# Patient Record
Sex: Male | Born: 1950 | Race: White | Hispanic: No | Marital: Married | Smoking: Never smoker
Health system: Southern US, Community
[De-identification: ages and names within clinical notes are randomized; demographics above are authoritative.]

## PROBLEM LIST (undated history)

## (undated) DIAGNOSIS — C9000 Multiple myeloma not having achieved remission: Secondary | ICD-10-CM

## (undated) DIAGNOSIS — G629 Polyneuropathy, unspecified: Secondary | ICD-10-CM

## (undated) HISTORY — PX: NO PAST SURGERIES: SHX2092

## (undated) HISTORY — DX: Polyneuropathy, unspecified: G62.9

---

## 2014-02-25 ENCOUNTER — Ambulatory Visit (INDEPENDENT_AMBULATORY_CARE_PROVIDER_SITE_OTHER): Payer: BC Managed Care – PPO

## 2014-02-25 ENCOUNTER — Ambulatory Visit (INDEPENDENT_AMBULATORY_CARE_PROVIDER_SITE_OTHER): Payer: BC Managed Care – PPO | Admitting: Internal Medicine

## 2014-02-25 ENCOUNTER — Telehealth: Payer: Self-pay

## 2014-02-25 VITALS — BP 132/82 | HR 92 | Temp 98.6°F | Resp 16 | Ht 75.5 in | Wt 260.0 lb

## 2014-02-25 DIAGNOSIS — F329 Major depressive disorder, single episode, unspecified: Secondary | ICD-10-CM | POA: Insufficient documentation

## 2014-02-25 DIAGNOSIS — M79642 Pain in left hand: Secondary | ICD-10-CM

## 2014-02-25 DIAGNOSIS — F32A Depression, unspecified: Secondary | ICD-10-CM | POA: Insufficient documentation

## 2014-02-25 DIAGNOSIS — S63502A Unspecified sprain of left wrist, initial encounter: Secondary | ICD-10-CM

## 2014-02-25 DIAGNOSIS — F324 Major depressive disorder, single episode, in partial remission: Secondary | ICD-10-CM

## 2014-02-25 DIAGNOSIS — M25532 Pain in left wrist: Secondary | ICD-10-CM

## 2014-02-25 HISTORY — DX: Depression, unspecified: F32.A

## 2014-02-25 NOTE — Progress Notes (Signed)
IDENTIFYING INFORMATION  David Mcgrath / DOB: 1950-04-24 / MRN: [id number]  The patient has Depression on his problem list.  SUBJECTIVE  Chief Complaint: Wrist Injury   History of present illness: [name] is a 63 y.o. year old male who presents with 5 days of left wrist pain.  He injured the wrist while participating in weight training. The pain is roughly 4/10 He reports a slight decrease in ROM, strength, and swelling. He reports having bony tenderness over his carpal bones.  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  He does not feel that this new problem is related to the wrist injury.    He reports a three year history of depression that began after some financial stressors in 2013.  At that time he had difficulty with dysthymic mood, anhedonia, poor appetite, psychomotor slowing, and poor concentration.  He did not, nor has he ever, felt suicidal. He did not pursue talk therapy for this problem.  He started taking 20 mg of fluoxetine and reports that this has remised his symptoms. He has an excellent support network which includes his wife, his church, and friends.   He continues fluoxetine today and denies dysthymia and and anhedonia at this time.    He  has a past medical history of Neuropathy.    He has a current medication list which includes the following prescription(s): fluoxetine.  [name] has No Known Allergies. He  reports that he has never smoked. He does not have any smokeless tobacco history on file. He reports that he drinks alcohol. He reports that he does not use illicit drugs. He  has no sexual activity history on file.  The patient  has no past surgical history on file.  His family history includes Cancer in his father and mother; Diabetes in his mother; Heart disease in his mother; Hyperlipidemia in his father.  Review of Systems  Constitutional: Negative.   Respiratory: Negative for cough and shortness of breath.     Cardiovascular: Negative for chest pain and palpitations.  Gastrointestinal: Negative for heartburn and nausea.  Musculoskeletal: Positive for myalgias and joint pain. Negative for falls.  Skin: Negative.   Neurological: Negative for dizziness, speech change, focal weakness and headaches.  Psychiatric/Behavioral: Negative for depression and suicidal ideas. The patient is not nervous/anxious and does not have insomnia.     OBJECTIVE  Blood pressure 132/82, pulse 92, temperature 98.6 F (37 C), temperature source Oral, resp. rate 16, height 6' 3.5" (1.918 m), weight 260 lb (117.935 kg), SpO2 99 %. The patient's body mass index is 32.06 kg/(m^2).  Physical Exam  Musculoskeletal:       Left elbow: He exhibits normal range of motion, no swelling, no effusion, no deformity and no laceration. No tenderness found.       Left wrist: He exhibits decreased range of motion and swelling. He exhibits no crepitus and no deformity.       Arms:   No results found for this or any previous visit (from the past 24 hour(s)).  [facility] reading (PRIMARY) by  Dr. Perrin MalteseGuest: No acute fracture.  Some arthritic changes noted.     Mental Status Exam:  Appearance: Neat and Well Groomed Eye Contact::  Good Psychomotor:  Normal Speech:  Clear and Coherent and Normal Rate Volume:  Normal Mood:  Euthymic Affect:  Non-Congruent Thought Process:  Linear and Logical Orientation:  Full (Time, Place, and Person) Thought Content:  Negative for grandiosity, hallucinations, and  dellusions. Suicidal Thoughts:  No Homicidal Thoughts:  No Judgement:  Good Insight:  Good  ASSESSMENT & PLAN  David Mcgrath was seen today for wrist injury.  Diagnoses and associated orders for this visit:  Hand pain, left - DG Hand Complete Left; Future  Wrist pain, left - DG Wrist Complete Left; Future  Major depressive disorder, single episode, in partial remission: -     Maintained on fluoxetine 20 mg daily.  No refills needed at this  time.   Wrist sprain, left, initial encounter -     Xray negative for fracture per Dr. Ernestene MentionGuest's read.  Will await radiologist report to confirm.  Left  thumb spica splint applied.  [facility]   -     Injury care provided in AVS.  -     Patient does not endorse the need for pain medication at this time.  -     Advised f/u in one week.   Assessment and plan discussed with Dr. Perrin MalteseGuest and he is in agreement.    The patient was instructed to to call or comeback to clinic as needed, or should symptoms warrant.  David Mcgrath, MHS, PA-C [facility] 02/25/2014 10:42 AM

## 2014-02-25 NOTE — Telephone Encounter (Signed)
Pt saw P.A. Clark 11/24 and requested him to call, asked pt if he could provide more information on what this is regarding, pt states he would just like P.A. Clark to call him.

## 2014-02-25 NOTE — Patient Instructions (Signed)
Ice the hand and wrist at least 3 times daily for 20 minutes.  When possible, keep the wrist and hand above the level of the heart.  Please have your wedding ring removed today, as an increase in swelling could cause neurovascular damage to your finger. Deliah BostonMichael Shawntee Mainwaring, MS, PA-C   10:43 AM, 02/25/2014

## 2016-11-09 ENCOUNTER — Other Ambulatory Visit (INDEPENDENT_AMBULATORY_CARE_PROVIDER_SITE_OTHER): Payer: Self-pay | Admitting: Otolaryngology

## 2016-11-09 ENCOUNTER — Other Ambulatory Visit: Payer: Self-pay

## 2016-11-09 DIAGNOSIS — J329 Chronic sinusitis, unspecified: Secondary | ICD-10-CM

## 2016-11-14 ENCOUNTER — Ambulatory Visit
Admission: RE | Admit: 2016-11-14 | Discharge: 2016-11-14 | Disposition: A | Discharge status: Home / Self Care | Payer: BLUE CROSS/BLUE SHIELD | Source: Ambulatory Visit | Attending: Otolaryngology | Admitting: Otolaryngology

## 2016-11-14 DIAGNOSIS — J329 Chronic sinusitis, unspecified: Secondary | ICD-10-CM

## 2018-02-08 DIAGNOSIS — Z01818 Encounter for other preprocedural examination: Secondary | ICD-10-CM | POA: Diagnosis not present

## 2018-02-22 DIAGNOSIS — K640 First degree hemorrhoids: Secondary | ICD-10-CM | POA: Diagnosis not present

## 2018-02-22 DIAGNOSIS — Z800 Family history of malignant neoplasm of digestive organs: Secondary | ICD-10-CM | POA: Diagnosis not present

## 2018-02-22 DIAGNOSIS — K5730 Diverticulosis of large intestine without perforation or abscess without bleeding: Secondary | ICD-10-CM | POA: Diagnosis not present

## 2018-04-24 DIAGNOSIS — G5603 Carpal tunnel syndrome, bilateral upper limbs: Secondary | ICD-10-CM | POA: Diagnosis not present

## 2018-04-28 DIAGNOSIS — G5622 Lesion of ulnar nerve, left upper limb: Secondary | ICD-10-CM | POA: Diagnosis not present

## 2018-04-28 DIAGNOSIS — G5603 Carpal tunnel syndrome, bilateral upper limbs: Secondary | ICD-10-CM | POA: Diagnosis not present

## 2018-05-03 DIAGNOSIS — G5603 Carpal tunnel syndrome, bilateral upper limbs: Secondary | ICD-10-CM | POA: Diagnosis not present

## 2018-06-04 DIAGNOSIS — G5603 Carpal tunnel syndrome, bilateral upper limbs: Secondary | ICD-10-CM | POA: Diagnosis not present

## 2018-06-04 DIAGNOSIS — G5601 Carpal tunnel syndrome, right upper limb: Secondary | ICD-10-CM | POA: Diagnosis not present

## 2018-06-04 DIAGNOSIS — G5602 Carpal tunnel syndrome, left upper limb: Secondary | ICD-10-CM | POA: Diagnosis not present

## 2018-06-04 HISTORY — DX: Carpal tunnel syndrome, right upper limb: G56.01

## 2018-06-19 DIAGNOSIS — G5601 Carpal tunnel syndrome, right upper limb: Secondary | ICD-10-CM | POA: Diagnosis not present

## 2018-06-19 DIAGNOSIS — G5602 Carpal tunnel syndrome, left upper limb: Secondary | ICD-10-CM | POA: Diagnosis not present

## 2018-11-15 DIAGNOSIS — H2513 Age-related nuclear cataract, bilateral: Secondary | ICD-10-CM | POA: Diagnosis not present

## 2018-11-15 DIAGNOSIS — H524 Presbyopia: Secondary | ICD-10-CM | POA: Diagnosis not present

## 2018-11-20 DIAGNOSIS — I10 Essential (primary) hypertension: Secondary | ICD-10-CM | POA: Diagnosis not present

## 2018-11-20 DIAGNOSIS — Z6831 Body mass index (BMI) 31.0-31.9, adult: Secondary | ICD-10-CM | POA: Diagnosis not present

## 2018-11-20 DIAGNOSIS — Z0000 Encounter for general adult medical examination without abnormal findings: Secondary | ICD-10-CM | POA: Diagnosis not present

## 2018-11-20 DIAGNOSIS — Z1211 Encounter for screening for malignant neoplasm of colon: Secondary | ICD-10-CM | POA: Diagnosis not present

## 2018-11-22 DIAGNOSIS — Z1211 Encounter for screening for malignant neoplasm of colon: Secondary | ICD-10-CM | POA: Diagnosis not present

## 2018-11-22 DIAGNOSIS — Z1389 Encounter for screening for other disorder: Secondary | ICD-10-CM | POA: Diagnosis not present

## 2018-11-22 DIAGNOSIS — Z6832 Body mass index (BMI) 32.0-32.9, adult: Secondary | ICD-10-CM | POA: Diagnosis not present

## 2018-11-22 DIAGNOSIS — Z0000 Encounter for general adult medical examination without abnormal findings: Secondary | ICD-10-CM | POA: Diagnosis not present

## 2018-11-23 DIAGNOSIS — J323 Chronic sphenoidal sinusitis: Secondary | ICD-10-CM | POA: Diagnosis not present

## 2018-11-23 DIAGNOSIS — R42 Dizziness and giddiness: Secondary | ICD-10-CM | POA: Diagnosis not present

## 2018-11-23 DIAGNOSIS — J310 Chronic rhinitis: Secondary | ICD-10-CM | POA: Diagnosis not present

## 2018-11-23 DIAGNOSIS — H608X3 Other otitis externa, bilateral: Secondary | ICD-10-CM | POA: Diagnosis not present

## 2019-04-28 ENCOUNTER — Ambulatory Visit: Payer: BLUE CROSS/BLUE SHIELD

## 2019-05-18 ENCOUNTER — Ambulatory Visit: Payer: BC Managed Care – PPO | Attending: Internal Medicine

## 2019-05-18 DIAGNOSIS — Z23 Encounter for immunization: Secondary | ICD-10-CM | POA: Insufficient documentation

## 2019-05-18 NOTE — Progress Notes (Signed)
   Covid-19 Vaccination Clinic  Name:  David Mcgrath    MRN: [id number] DOB: 08/23/1950  05/18/2019  [name] was observed post Covid-19 immunization for 15 minutes without incidence. He was provided with Vaccine Information Sheet and instruction to access the V-Safe system.   [name] was instructed to call 911 with any severe reactions post vaccine: Marland Kitchen Difficulty breathing  . Swelling of your face and throat  . A fast heartbeat  . A bad rash all over your body  . Dizziness and weakness    Immunizations Administered    Name Date Dose VIS Date Route   Pfizer COVID-19 Vaccine 05/18/2019  8:30 AM 0.3 mL 03/15/2019 Intramuscular   Manufacturer: [employer], [employer]   Lot: [id number]   NDC: [address]-0

## 2019-06-09 ENCOUNTER — Ambulatory Visit: Payer: BC Managed Care – PPO | Attending: Internal Medicine

## 2019-06-09 DIAGNOSIS — Z23 Encounter for immunization: Secondary | ICD-10-CM | POA: Insufficient documentation

## 2019-06-09 NOTE — Progress Notes (Signed)
   Covid-19 Vaccination Clinic  Name:  Reg Bircher    MRN: [id number] DOB: [birth year]  06/09/2019  Mr. Poffenberger was observed post Covid-19 immunization for 30 minutes based on pre-vaccination screening without incident. He was provided with Vaccine Information Sheet and instruction to access the V-Safe system.   Mr. Candelas was instructed to call 911 with any severe reactions post vaccine: Marland Kitchen Difficulty breathing  . Swelling of face and throat  . A fast heartbeat  . A bad rash all over body  . Dizziness and weakness   Immunizations Administered    Name Date Dose VIS Date Route   Pfizer COVID-19 Vaccine 06/09/2019 10:11 AM 0.3 mL 03/15/2019 Intramuscular   Manufacturer: [employer], [employer]   Lot: [id number]   NDC: [address]-0

## 2019-08-06 DIAGNOSIS — K625 Hemorrhage of anus and rectum: Secondary | ICD-10-CM | POA: Diagnosis not present

## 2019-08-06 DIAGNOSIS — K648 Other hemorrhoids: Secondary | ICD-10-CM | POA: Diagnosis not present

## 2019-08-12 IMAGING — CT CT MAXILLOFACIAL W/O CM
1 series · 15 of 30 positions shown, 19 images · non-contrast
Comparison: None.

CLINICAL DATA: Chronic sinusitis and infections. No relief with
antibiotics.

EXAM:
CT MAXILLOFACIAL WITHOUT CONTRAST
TECHNIQUE: Multidetector CT images of the paranasal sinuses were obtained using
the standard protocol without intravenous contrast.

[Series 4: soft tissue · axial · 0.52mm/px · z∈[-137,+21]mm · 15 of 170 slices shown, 19 images]
[im 6/170  brain]
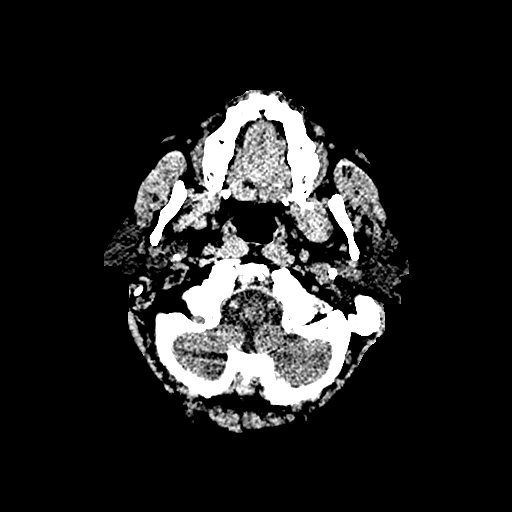
[im 6/170  bone]
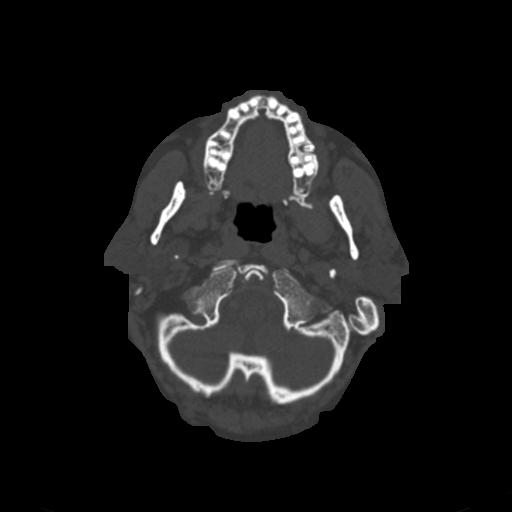
[im 18/170  bone]
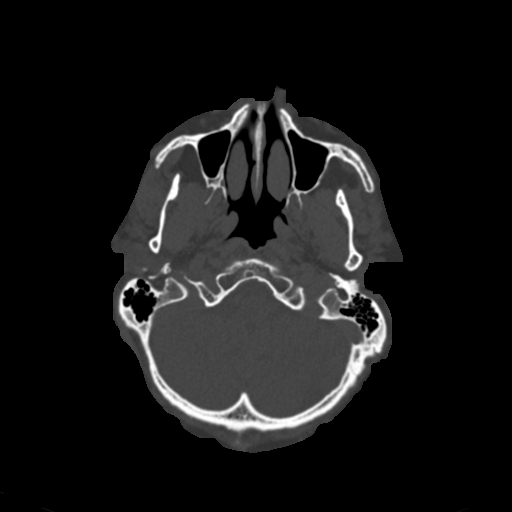
[im 30/170  bone]
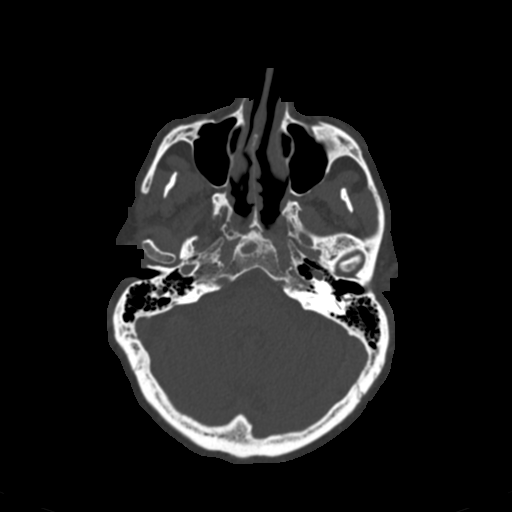
[im 41/170  bone]
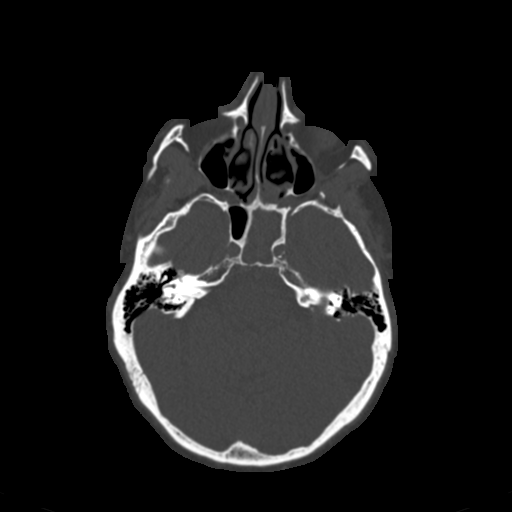
[im 53/170  brain]
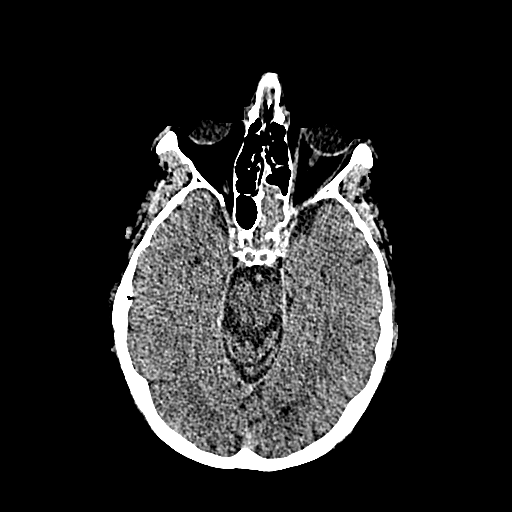
[im 53/170  bone]
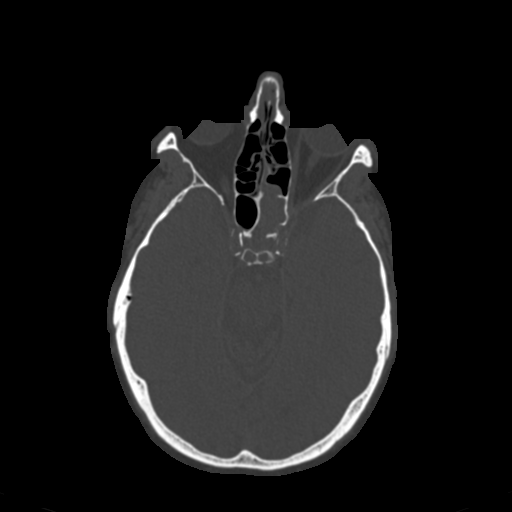
[im 65/170  bone]
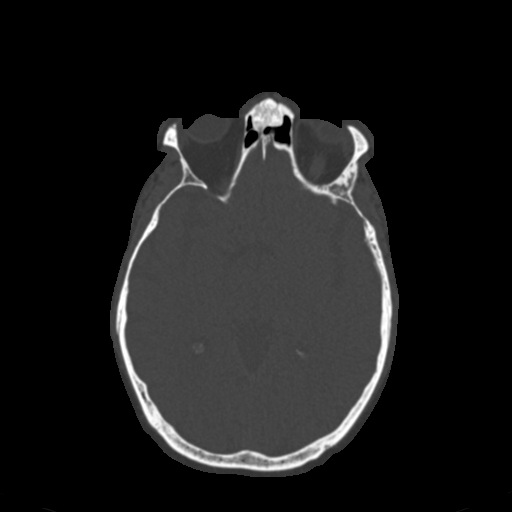
[im 76/170  bone]
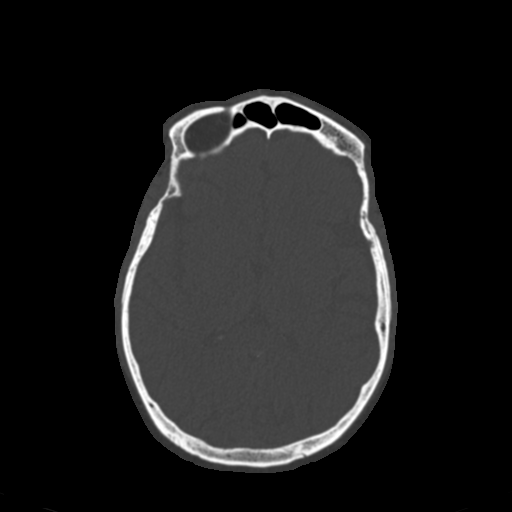
[im 88/170  bone]
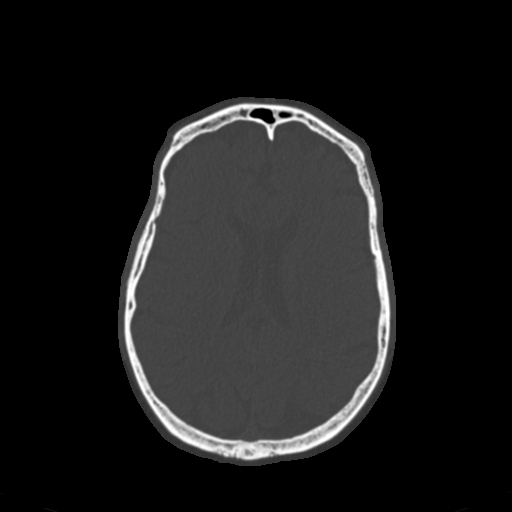
[im 94/170  brain]
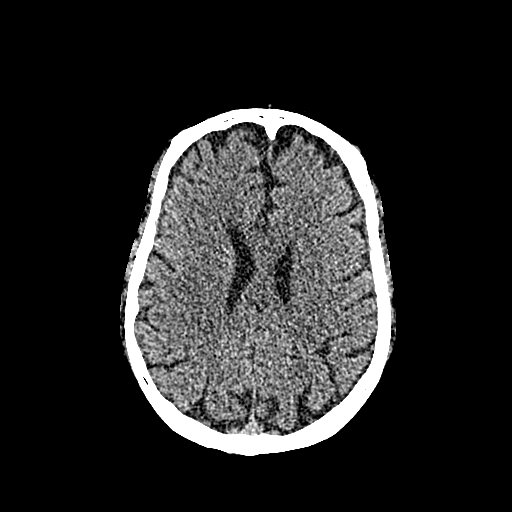
[im 94/170  bone]
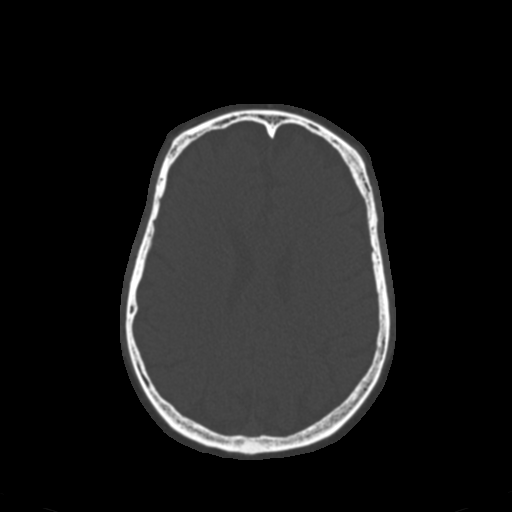
[im 105/170  bone]
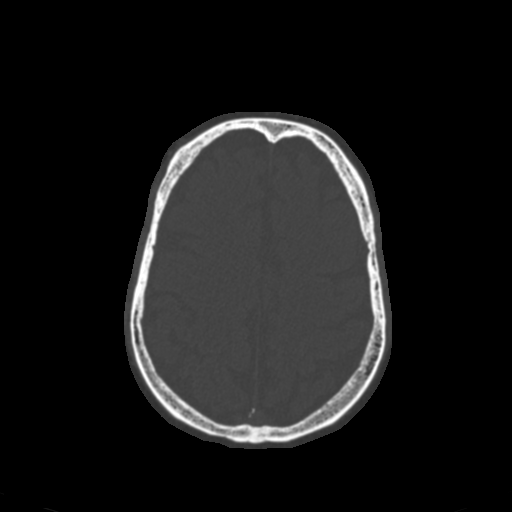
[im 117/170  bone]
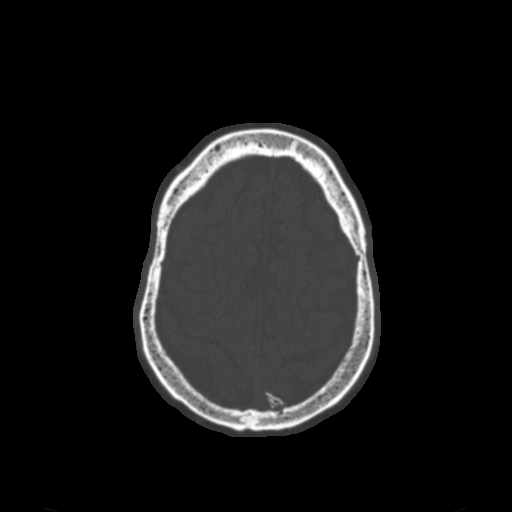
[im 129/170  bone]
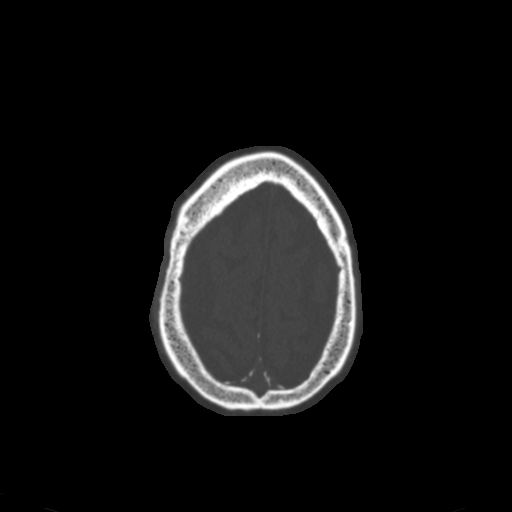
[im 140/170  brain]
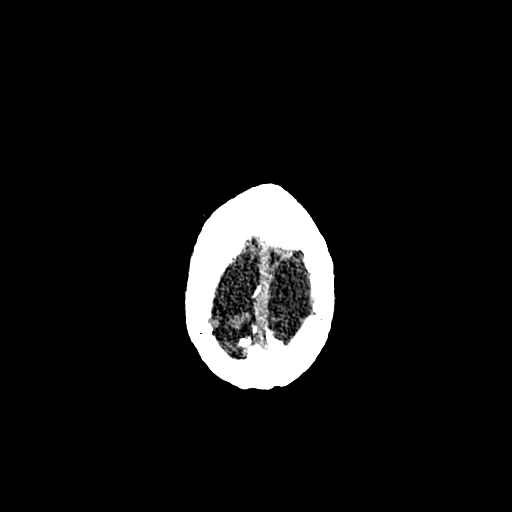
[im 140/170  bone]
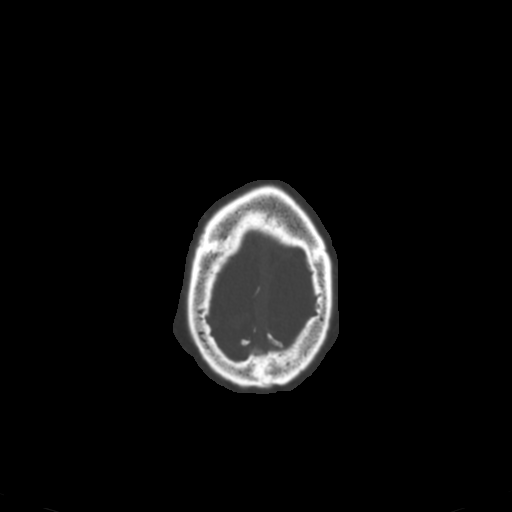
[im 152/170  bone]
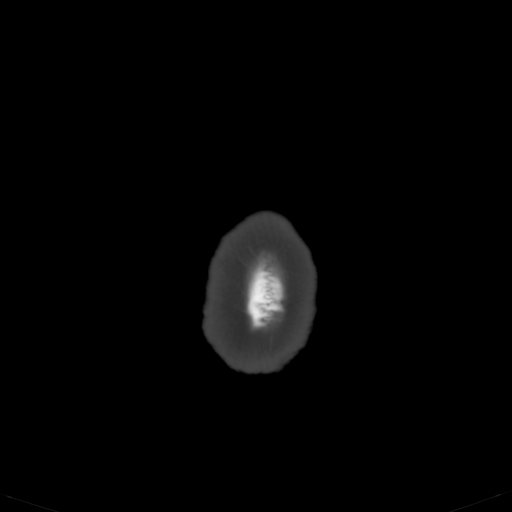
[im 164/170  bone]
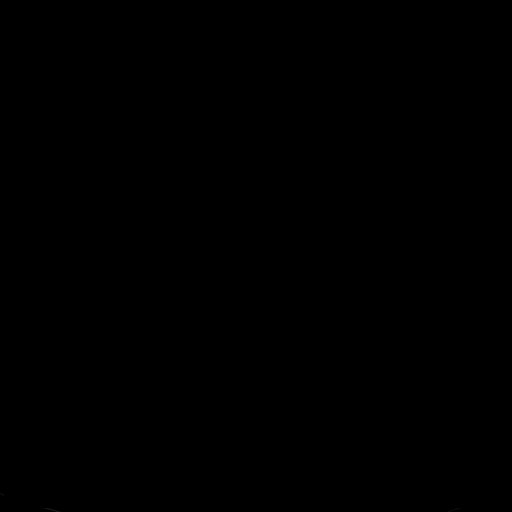

[15 of 30 positions shown; findings below may reference images not displayed]

FINDINGS: Paranasal sinuses:

Frontal: Normally aerated. Patent frontal sinus drainage pathways.

Ethmoid: Normally aerated. A prominent ethmoid bulla is present
bilaterally without obstruction.

Maxillary: Normally aerated.

Sphenoid: The left sphenoid sinus is chronically opacified. Wall
thickening is noted. There is soft tissue with calcification
extending into the posterior ethmoid with widening of the sphenoid
ethmoidal recess. There is marked thinning or dehiscence of the roof
of the left sphenoid sinus. This may communicate with the sella
turcica. The left sphenoid sinus is expanded. Mucocele is suspected.

Right ostiomeatal unit: Patent.

Left ostiomeatal unit: Patent.

Nasal passages: The nasal cavity is clear. Rightward nasal septal
spurring extends 11 mm to the right, displacing the right middle
turbinate without obstruction.

Anatomy: No pneumatization superior to anterior ethmoid notches.
Symmetric and intact olfactory grooves and fovea ethmoidalis, Keros
I (1-3mm). Sellar sphenoid pneumatization pattern. No dehiscence of
carotid or optic canals. No onodi cell.

Other: Orbits and intracranial compartment are unremarkable. Visible
mastoid air cells are normally aerated.
IMPRESSION: 1. Chronic obstruction of the left sphenoid sinus with what appears
to be mucocele.
2. Marked thinning or dehiscence of the roof of the left sphenoid
sinus with possible communication into the sella turcica.
3. The paranasal sinuses and mastoid air cells are otherwise clear.
4. Rightward nasal septal spurring as described.
5. Prominent ethmoid bulla bilaterally without obstruction.

## 2019-11-18 ENCOUNTER — Encounter: Payer: Self-pay | Admitting: Internal Medicine

## 2019-11-18 ENCOUNTER — Other Ambulatory Visit: Payer: Self-pay | Admitting: Internal Medicine

## 2019-11-18 DIAGNOSIS — G629 Polyneuropathy, unspecified: Secondary | ICD-10-CM | POA: Diagnosis not present

## 2019-11-18 DIAGNOSIS — E785 Hyperlipidemia, unspecified: Secondary | ICD-10-CM | POA: Diagnosis not present

## 2019-11-18 DIAGNOSIS — R2681 Unsteadiness on feet: Secondary | ICD-10-CM | POA: Diagnosis not present

## 2019-11-18 DIAGNOSIS — F321 Major depressive disorder, single episode, moderate: Secondary | ICD-10-CM | POA: Diagnosis not present

## 2019-12-31 DIAGNOSIS — Z23 Encounter for immunization: Secondary | ICD-10-CM | POA: Diagnosis not present

## 2019-12-31 DIAGNOSIS — R2681 Unsteadiness on feet: Secondary | ICD-10-CM | POA: Diagnosis not present

## 2019-12-31 DIAGNOSIS — G629 Polyneuropathy, unspecified: Secondary | ICD-10-CM | POA: Diagnosis not present

## 2020-01-09 ENCOUNTER — Encounter: Payer: Self-pay | Admitting: Neurology

## 2020-01-09 ENCOUNTER — Ambulatory Visit: Payer: BC Managed Care – PPO | Admitting: Neurology

## 2020-01-09 VITALS — BP 129/60 | HR 69 | Ht 76.0 in | Wt 260.0 lb

## 2020-01-09 DIAGNOSIS — R260 Ataxic gait: Secondary | ICD-10-CM

## 2020-01-09 DIAGNOSIS — G629 Polyneuropathy, unspecified: Secondary | ICD-10-CM

## 2020-01-09 DIAGNOSIS — R202 Paresthesia of skin: Secondary | ICD-10-CM | POA: Diagnosis not present

## 2020-01-09 MED ORDER — TOPIRAMATE 25 MG PO TABS
25.0000 mg | ORAL_TABLET | Freq: Two times a day (BID) | ORAL | 2 refills | Status: DC
Start: 2020-01-09 — End: 2020-04-06

## 2020-01-09 NOTE — Progress Notes (Signed)
[facility] [address] [place]. [place] [address] [phone]       OFFICE CONSULT NOTE  David Mcgrath Date of Birth:  1951-01-01 Medical Record Number:  [id number]   Referring MD: David Mcgrath Reason for Referral: Neuropathy HPI: David Mcgrath is a 69 year old pleasant Caucasian male seen today for initial office consultation visit for neuropathy.  He states he has longstanding peripheral neuropathy for at least 30+ years.  He has had chronic paresthesias and tingling but feels for the last 5 to 6 years his balance is getting worse as well as the tingling and numbness at times her course quite severe and extend up from his toes where he has constant numbness to sometimes up his legs and medial thighs as well.  He also has some occasional cramps.  Eating at times has a difficult time sleeping because of his paresthesias at the bottom of the feet which is constantly numb and tingling.  He states he has been seen by neurologist in the past and remembers having EMG nerve conduction study done in 2011 when he was seen at [facility] neurology.  He is not sure whether he has tried medications like gabapentin, Topamax, Lyrica for his paresthesias.  He also had paresthesias in the hand and underwent bilateral carpal tunnel surgery about a year ago by Dr. Orlan Mcgrath.  He is denies significant pain in his hands but has persistent paresthesias and slight weakness with diminished fine motor skills in his hand.From.  He is brought in some referral notes which includes recent labs from earlier this month which show serum protein electrophoresis and TSH.  He also recently underwent a sleep study and was diagnosed with sleep apnea and started using CPAP.  He recently underwent lab work which she has brought which showed normal serum protein electrophoresis TSH.  He states is careful and gets up slowly has had occasional falls but no major injuries.  He does not use a cane or walker but admits he has  more trouble walking on uneven surfaces.  ROS:   14 system review of systems is positive for numbness, tingling, cramps, gait imbalance, falls and all other systems negative PMH:  Past Medical History:  Diagnosis Date  . Neuropathy     Social History:  Social History   Socioeconomic History  . Marital status: Married    Spouse name: David Mcgrath  . Number of children: 0  . Years of education: Not on file  . Highest education level: Not on file  Occupational History  . Occupation: Event organiser  Tobacco Use  . Smoking status: Never Smoker  . Smokeless tobacco: Never Used  Substance and Sexual Activity  . Alcohol use: Yes    Alcohol/week: 0.0 standard drinks  . Drug use: No  . Sexual activity: Not on file  Other Topics Concern  . Not on file  Social History Narrative   Lives with spouse   Right handed   Drinks 4+ cups of caffeine daily   Social Determinants of Health   Financial Resource Strain:   . Difficulty of Paying Living Expenses: Not on file  Food Insecurity:   . Worried About Programme researcher, broadcasting/film/video in the Last Year: Not on file  . Ran Out of Food in the Last Year: Not on file  Transportation Needs:   . Lack of Transportation (Medical): Not on file  . Lack of Transportation (Non-Medical): Not on file  Physical Activity:   . Days of Exercise per Week: Not on file  .  Minutes of Exercise per Session: Not on file  Stress:   . Feeling of Stress : Not on file  Social Connections:   . Frequency of Communication with Friends and Family: Not on file  . Frequency of Social Gatherings with Friends and Family: Not on file  . Attends Religious Services: Not on file  . Active Member of Clubs or Organizations: Not on file  . Attends Banker Meetings: Not on file  . Marital Status: Not on file  Intimate Partner Violence:   . Fear of Current or Ex-Partner: Not on file  . Emotionally Abused: Not on file  . Physically Abused: Not on file  . Sexually Abused: Not on  file    Medications:   Current Outpatient Medications on File Prior to Visit  Medication Sig Dispense Refill  . allopurinol (ZYLOPRIM) 300 MG tablet allopurinol 300 mg tablet    . ezetimibe (ZETIA) 10 MG tablet Take 10 mg by mouth daily.    Marland Kitchen FLUoxetine (PROZAC) 10 MG tablet Take 10 mg by mouth 2 (two) times daily.    . indomethacin (INDOCIN) 50 MG capsule daily as needed.     Marland Kitchen olmesartan (BENICAR) 20 MG tablet Take 20 mg by mouth daily.     No current facility-administered medications on file prior to visit.    Allergies:   Allergies  Allergen Reactions  . Shrimp [Shellfish Allergy]     Physical Exam General: well developed, well nourished middle-aged Caucasian male, seated, in no evident distress Head: head normocephalic and atraumatic.   Neck: supple with no carotid or supraclavicular bruits Cardiovascular: regular rate and rhythm, no murmurs Musculoskeletal: no deformity Skin:  no rash/petichiae 1+ pedal edema bilaterally. Vascular:  Normal pulses all extremities  Neurologic Exam Mental Status: Awake and fully alert. Oriented to place and time. Recent and remote memory intact. Attention span, concentration and fund of knowledge appropriate. Mood and affect appropriate.  Cranial Nerves: Fundoscopic exam reveals sharp disc margins. Pupils equal, briskly reactive to light. Extraocular movements full without nystagmus. Visual fields full to confrontation. Hearing intact. Facial sensation intact. Face, tongue, palate moves normally and symmetrically.  Motor: Normal bulk and tone. Normal strength in all tested extremity muscles except mild wasting of bilateral thenar eminences with slight weakness of intrinsic hand muscles and grip bilaterally.  Mild weakness of ankle plantar flexors bilaterally.. Sensory.: intact to touch , pinprick , diminished position and vibratory sensation over toes bilaterally..  Coordination: Rapid alternating movements normal in all extremities.  Finger-to-nose and heel-to-shin performed accurately bilaterally. Gait and Station: Arises from chair with slight difficulty. Stance is slightly broad-based l. Gait demonstrates mild ataxia and is unsteady on a narrow base and while standing on either foot unsupported.   Reflexes: 1+ and symmetric except both ankle jerks are depressed.. Toes downgoing.      ASSESSMENT: 69 year old Caucasian male with longstanding 30-year history of peripheral neuropathy with worsening paresthesias and gait ataxia recently.     PLAN: I had a long discussion with the patient regarding his longstanding symptoms of lower and upper extremity paresthesias and peripheral neuropathy and gait ataxia and answered questions.  Recommend trial of Topamax 25 mg twice daily for his paresthesias and to increase as tolerated.  Have discussed possible side effects with him and advised him to call me if needed.  Check EMG nerve conduction study as well as neuropathy panel labs.  We also discussed fall and safety precautions.  Greater than 50% time during this 45-minute consultation visit  was spent on counseling and coordination of care about his longstanding peripheral neuropathy, neuropathic pain and gait ataxia and answering questions he will return for follow-up in the future in 2 months or call earlier if necessary. Delia Heady, MD Note: This document was prepared with digital dictation and possible smart phrase technology. Any transcriptional errors that result from this process are unintentional.

## 2020-01-09 NOTE — Patient Instructions (Signed)
I had a long discussion with the patient regarding his longstanding symptoms of lower and upper extremity paresthesias and peripheral neuropathy and gait ataxia and answered questions.  Recommend trial of Topamax 25 mg twice daily for his paresthesias and to increase as tolerated.  Have discussed possible side effects with him and advised him to call me if needed.  Check EMG nerve conduction study as well as neuropathy panel labs.  We also discussed fall and safety precautions.  He will return for follow-up in the future in 2 months or call earlier if necessary.  Peripheral Neuropathy Peripheral neuropathy is a type of nerve damage. It affects nerves that carry signals between the spinal cord and the arms, legs, and the rest of the body (peripheral nerves). It does not affect nerves in the spinal cord or brain. In peripheral neuropathy, one nerve or a group of nerves may be damaged. Peripheral neuropathy is a broad category that includes many specific nerve disorders, like diabetic neuropathy, hereditary neuropathy, and carpal tunnel syndrome. What are the causes? This condition may be caused by:  Diabetes. This is the most common cause of peripheral neuropathy.  Nerve injury.  Pressure or stress on a nerve that lasts a long time.  Lack (deficiency) of B vitamins. This can result from alcoholism, poor diet, or a restricted diet.  Infections.  Autoimmune diseases, such as rheumatoid arthritis and systemic lupus erythematosus.  Nerve diseases that are passed from parent to child (inherited).  Some medicines, such as cancer medicines (chemotherapy).  Poisonous (toxic) substances, such as lead and mercury.  Too little blood flowing to the legs.  Kidney disease.  Thyroid disease. In some cases, the cause of this condition is not known. What are the signs or symptoms? Symptoms of this condition depend on which of your nerves is damaged. Common symptoms include:  Loss of feeling (numbness)  in the feet, hands, or both.  Tingling in the feet, hands, or both.  Burning pain.  Very sensitive skin.  Weakness.  Not being able to move a part of the body (paralysis).  Muscle twitching.  Clumsiness or poor coordination.  Loss of balance.  Not being able to control your bladder.  Feeling dizzy.  Sexual problems. How is this diagnosed? Diagnosing and finding the cause of peripheral neuropathy can be difficult. Your health care provider will take your medical history and do a physical exam. A neurological exam will also be done. This involves checking things that are affected by your brain, spinal cord, and nerves (nervous system). For example, your health care provider will check your reflexes, how you move, and what you can feel. You may have other tests, such as:  Blood tests.  Electromyogram (EMG) and nerve conduction tests. These tests check nerve function and how well the nerves are controlling the muscles.  Imaging tests, such as CT scans or MRI to rule out other causes of your symptoms.  Removing a small piece of nerve to be examined in a lab (nerve biopsy). This is rare.  Removing and examining a small amount of the fluid that surrounds the brain and spinal cord (lumbar puncture). This is rare. How is this treated? Treatment for this condition may involve:  Treating the underlying cause of the neuropathy, such as diabetes, kidney disease, or vitamin deficiencies.  Stopping medicines that can cause neuropathy, such as chemotherapy.  Medicine to relieve pain. Medicines may include: ? Prescription or over-the-counter pain medicine. ? Antiseizure medicine. ? Antidepressants. ? Pain-relieving patches that are  applied to painful areas of skin.  Surgery to relieve pressure on a nerve or to destroy a nerve that is causing pain.  Physical therapy to help improve movement and balance.  Devices to help you move around (assistive devices). Follow these  instructions at home: Medicines  Take over-the-counter and prescription medicines only as told by your health care provider. Do not take any other medicines without first asking your health care provider.  Do not drive or use heavy machinery while taking prescription pain medicine. Lifestyle   Do not use any products that contain nicotine or tobacco, such as cigarettes and e-cigarettes. Smoking keeps blood from reaching damaged nerves. If you need help quitting, ask your health care provider.  Avoid or limit alcohol. Too much alcohol can cause a vitamin B deficiency, and vitamin B is needed for healthy nerves.  Eat a healthy diet. This includes: ? Eating foods that are high in fiber, such as fresh fruits and vegetables, whole grains, and beans. ? Limiting foods that are high in fat and processed sugars, such as fried or sweet foods. General instructions   If you have diabetes, work closely with your health care provider to keep your blood sugar under control.  If you have numbness in your feet: ? Check every day for signs of injury or infection. Watch for redness, warmth, and swelling. ? Wear padded socks and comfortable shoes. These help protect your feet.  Develop a good support system. Living with peripheral neuropathy can be stressful. Consider talking with a mental health specialist or joining a support group.  Use assistive devices and attend physical therapy as told by your health care provider. This may include using a walker or a cane.  Keep all follow-up visits as told by your health care provider. This is important. Contact a health care provider if:  You have new signs or symptoms of peripheral neuropathy.  You are struggling emotionally from dealing with peripheral neuropathy.  Your pain is not well-controlled. Get help right away if:  You have an injury or infection that is not healing normally.  You develop new weakness in an arm or leg.  You fall  frequently. Summary  Peripheral neuropathy is when the nerves in the arms, or legs are damaged, resulting in numbness, weakness, or pain.  There are many causes of peripheral neuropathy, including diabetes, pinched nerves, vitamin deficiencies, autoimmune disease, and hereditary conditions.  Diagnosing and finding the cause of peripheral neuropathy can be difficult. Your health care provider will take your medical history, do a physical exam, and do tests, including blood tests and nerve function tests.  Treatment involves treating the underlying cause of the neuropathy and taking medicines to help control pain. Physical therapy and assistive devices may also help. This information is not intended to replace advice given to you by your health care provider. Make sure you discuss any questions you have with your health care provider. Document Revised: 03/03/2017 Document Reviewed: 05/30/2016 Elsevier Patient Education  2020 [employer].  Fall Prevention in the Home, Adult Falls can cause injuries. They can happen to people of all ages. There are many things you can do to make your home safe and to help prevent falls. Ask for help when making these changes, if needed. What actions can I take to prevent falls? General Instructions  Use good lighting in all rooms. Replace any light bulbs that burn out.  Turn on the lights when you go into a dark area. Use night-lights.  Keep  items that you use often in easy-to-reach places. Lower the shelves around your home if necessary.  Set up your furniture so you have a clear path. Avoid moving your furniture around.  Do not have throw rugs and other things on the floor that can make you trip.  Avoid walking on wet floors.  If any of your floors are uneven, fix them.  Add color or contrast paint or tape to clearly mark and help you see: ? Any grab bars or handrails. ? First and last steps of stairways. ? Where the edge of each step is.  If  you use a stepladder: ? Make sure that it is fully opened. Do not climb a closed stepladder. ? Make sure that both sides of the stepladder are locked into place. ? Ask someone to hold the stepladder for you while you use it.  If there are any pets around you, be aware of where they are. What can I do in the bathroom?      Keep the floor dry. Clean up any water that spills onto the floor as soon as it happens.  Remove soap buildup in the tub or shower regularly.  Use non-skid mats or decals on the floor of the tub or shower.  Attach bath mats securely with double-sided, non-slip rug tape.  If you need to sit down in the shower, use a plastic, non-slip stool.  Install grab bars by the toilet and in the tub and shower. Do not use towel bars as grab bars. What can I do in the bedroom?  Make sure that you have a light by your bed that is easy to reach.  Do not use any sheets or blankets that are too big for your bed. They should not hang down onto the floor.  Have a firm chair that has side arms. You can use this for support while you get dressed. What can I do in the kitchen?  Clean up any spills right away.  If you need to reach something above you, use a strong step stool that has a grab bar.  Keep electrical cords out of the way.  Do not use floor polish or wax that makes floors slippery. If you must use wax, use non-skid floor wax. What can I do with my stairs?  Do not leave any items on the stairs.  Make sure that you have a light switch at the top of the stairs and the bottom of the stairs. If you do not have them, ask someone to add them for you.  Make sure that there are handrails on both sides of the stairs, and use them. Fix handrails that are broken or loose. Make sure that handrails are as long as the stairways.  Install non-slip stair treads on all stairs in your home.  Avoid having throw rugs at the top or bottom of the stairs. If you do have throw rugs,  attach them to the floor with carpet tape.  Choose a carpet that does not hide the edge of the steps on the stairway.  Check any carpeting to make sure that it is firmly attached to the stairs. Fix any carpet that is loose or worn. What can I do on the outside of my home?  Use bright outdoor lighting.  Regularly fix the edges of walkways and driveways and fix any cracks.  Remove anything that might make you trip as you walk through a door, such as a raised step or threshold.  Trim any bushes or trees on the path to your home.  Regularly check to see if handrails are loose or broken. Make sure that both sides of any steps have handrails.  Install guardrails along the edges of any raised decks and porches.  Clear walking paths of anything that might make someone trip, such as tools or rocks.  Have any leaves, snow, or ice cleared regularly.  Use sand or salt on walking paths during winter.  Clean up any spills in your garage right away. This includes grease or oil spills. What other actions can I take?  Wear shoes that: ? Have a low heel. Do not wear high heels. ? Have rubber bottoms. ? Are comfortable and fit you well. ? Are closed at the toe. Do not wear open-toe sandals.  Use tools that help you move around (mobility aids) if they are needed. These include: ? Canes. ? Walkers. ? Scooters. ? Crutches.  Review your medicines with your doctor. Some medicines can make you feel dizzy. This can increase your chance of falling. Ask your doctor what other things you can do to help prevent falls. Where to find more information  Centers for Disease Control and Prevention, STEADI: HealthcareCounselor.com.pthttps://cdc.gov  [employer]: RingConnections.sihttps://go4life.nia.nih.gov Contact a doctor if:  You are afraid of falling at home.  You feel weak, drowsy, or dizzy at home.  You fall at home. Summary  There are many simple things that you can do to make your home safe and to help prevent  falls.  Ways to make your home safe include removing tripping hazards and installing grab bars in the bathroom.  Ask for help when making these changes in your home. This information is not intended to replace advice given to you by your health care provider. Make sure you discuss any questions you have with your health care provider. Document Revised: 07/12/2018 Document Reviewed: 11/03/2016 Elsevier Patient Education  2020 [employer]

## 2020-01-12 NOTE — Progress Notes (Signed)
Kindly  inform patient that lab work shows low vitamin D level and positive ANA and he needs to see his primary MD for treatment for this

## 2020-01-13 ENCOUNTER — Telehealth: Payer: Self-pay | Admitting: Emergency Medicine

## 2020-01-13 DIAGNOSIS — R768 Other specified abnormal immunological findings in serum: Secondary | ICD-10-CM | POA: Diagnosis not present

## 2020-01-13 LAB — NEUROPATHY PANEL
A/G Ratio: 1.3 (ref 0.7–1.7)
Albumin ELP: 3.9 g/dL (ref 2.9–4.4)
Alpha 1: 0.2 g/dL (ref 0.0–0.4)
Alpha 2: 1 g/dL (ref 0.4–1.0)
Angio Convert Enzyme: 68 U/L (ref 14–82)
Anti Nuclear Antibody (ANA): POSITIVE — AB
Beta: 1 g/dL (ref 0.7–1.3)
Gamma Globulin: 0.9 g/dL (ref 0.4–1.8)
Globulin, Total: 3.1 g/dL (ref 2.2–3.9)
Rheumatoid fact SerPl-aCnc: 10 IU/mL (ref 0.0–13.9)
Sed Rate: 17 mm/hr (ref 0–30)
TSH: 2.48 u[IU]/mL (ref 0.450–4.500)
Total Protein: 7 g/dL (ref 6.0–8.5)
Vit D, 25-Hydroxy: 26.7 ng/mL — ABNORMAL LOW (ref 30.0–100.0)
Vitamin B-12: 952 pg/mL (ref 232–1245)

## 2020-01-13 NOTE — Telephone Encounter (Signed)
Patient called back and we discussed Dr. Marlis Edelson review of his bloodwork.  He verbalized understanding of low D and positive ANA and will need to get into PCP for follow up and treatment. Patient expressed appreciation.

## 2020-01-20 ENCOUNTER — Encounter: Payer: BC Managed Care – PPO | Admitting: Neurology

## 2020-01-30 ENCOUNTER — Encounter: Payer: BC Managed Care – PPO | Admitting: Diagnostic Neuroimaging

## 2020-01-30 ENCOUNTER — Ambulatory Visit (INDEPENDENT_AMBULATORY_CARE_PROVIDER_SITE_OTHER): Payer: BC Managed Care – PPO | Admitting: Diagnostic Neuroimaging

## 2020-01-30 DIAGNOSIS — G629 Polyneuropathy, unspecified: Secondary | ICD-10-CM | POA: Diagnosis not present

## 2020-01-30 DIAGNOSIS — Z0289 Encounter for other administrative examinations: Secondary | ICD-10-CM

## 2020-01-30 NOTE — Procedures (Signed)
[facility]  NCS (NERVE CONDUCTION STUDY) WITH EMG (ELECTROMYOGRAPHY) REPORT   STUDY DATE: 01/30/2020 PATIENT NAME: David Mcgrath DOB: 10-Feb-1951 MRN: [id number]  ORDERING CLINICIAN: Delia Heady, MD  TECHNOLOGIST: Durenda Age ELECTROMYOGRAPHER: Glenford Bayley. Dayanne Yiu, MD  CLINICAL INFORMATION: 69 year old male with neuropathy. History of bilateral carpal tunnel syndrome release surgeries.  FINDINGS: NERVE CONDUCTION STUDY:  Right median motor response has prolonged distal latency, decreased amplitude, slow conduction velocity.  Right ulnar motor response is normal.  Right peroneal and right tibial motor responses cannot be obtained.  Right median sensory response cannot be obtained.  Right ulnar sensory response is normal.  Right superficial peroneal sensory response could not be obtained.  Right sural sensory response has prolonged peak latency decreased amplitude.  Right ulnar F-wave latency is slightly prolonged.    NEEDLE ELECTROMYOGRAPHY:  Needle examination of right lower extremity shows abnormal spontaneous activity in right tibialis anterior and right gastrocnemius muscles at rest and decreased motor unit recruitment on exertion. Right vastus medialis is normal.   IMPRESSION:   Abnormal study demonstrating: - Severe length dependent, axonal sensorimotor polyneuropathy.  - Moderate right carpal tunnel syndrome.   INTERPRETING PHYSICIAN:  Suanne Marker, MD Certified in Neurology, Neurophysiology and Neuroimaging  [facility] [address] [place], [place] [address] [phone]  [facility]    Nerve / Sites Muscle Latency Ref. Amplitude Ref. Rel Amp Segments Distance Velocity Ref. Area    ms ms mV mV %  cm m/s m/s mVms  R Median - APB     Wrist APB 5.4 ?4.4 3.6 ?4.0 100 Wrist - APB 7   17.2     Upper arm APB 11.8  3.0  83.1 Upper arm - Wrist 28 44 ?49 20.5  R Ulnar - ADM     Wrist ADM 3.3 ?3.3 9.3 ?6.0 100  Wrist - ADM 7   33.7     B.Elbow ADM 7.8  8.8  94.8 B.Elbow - Wrist 23 51 ?49 31.9     A.Elbow ADM 9.7  8.2  92.5 A.Elbow - B.Elbow 10 51 ?49 30.2  R Peroneal - EDB     Ankle EDB NR ?6.5 NR ?2.0 NR Ankle - EDB 9   NR     Fib head EDB NR  NR  NR Fib head - Ankle 36 NR ?44 NR  R Tibial - AH     Ankle AH NR ?5.8 NR ?4.0 NR Ankle - AH 9 NR  NR             SNC    Nerve / Sites Rec. Site Peak Lat Ref.  Amp Ref. Segments Distance    ms ms V V  cm  R Sural - Ankle (Calf)     Calf Ankle 5.0 ?4.4 3 ?6 Calf - Ankle 14  R Superficial peroneal - Ankle     Lat leg Ankle NR ?4.4 NR ?6 Lat leg - Ankle 14  R Median - Orthodromic (Dig II, Mid palm)     Dig II Wrist NR ?3.4 NR ?10 Dig II - Wrist 13  R Ulnar - Orthodromic, (Dig V, Mid palm)     Dig V Wrist 3.1 ?3.1 5 ?5 Dig V - Wrist 43             F  Wave    Nerve F Lat Ref.   ms ms  R Ulnar - ADM 36.7 ?32.0       EMG Summary Table  Spontaneous MUAP Recruitment  Muscle IA Fib PSW Fasc Other Amp Dur. Poly Pattern  R. Vastus medialis Normal None None None _______ Normal Normal Normal Normal  R. Tibialis anterior Normal 1+ 1+ None _______ Increased Normal Normal Reduced  R. Gastrocnemius (Medial head) Normal 1+ None None _______ Increased Normal Normal Reduced

## 2020-02-04 NOTE — Progress Notes (Signed)
Kindly inform the patient that EMG nerve conduction study confirms his longstanding peripheral neuropathy as well as shows evidence of moderate pinched nerve in the right wrist despite his previous carpal tunnel surgery there.  Will discuss further upon follow-up visit

## 2020-02-05 NOTE — Telephone Encounter (Signed)
-----   Message from Micki Riley, MD sent at 02/04/2020  8:45 AM EDT ----- David Mcgrath inform the patient that EMG nerve conduction study confirms his longstanding peripheral neuropathy as well as shows evidence of moderate pinched nerve in the right wrist despite his previous carpal tunnel surgery there.  Will discuss further upon follow-up visit

## 2020-03-18 DIAGNOSIS — G629 Polyneuropathy, unspecified: Secondary | ICD-10-CM | POA: Diagnosis not present

## 2020-03-18 DIAGNOSIS — R768 Other specified abnormal immunological findings in serum: Secondary | ICD-10-CM | POA: Diagnosis not present

## 2020-03-18 DIAGNOSIS — R200 Anesthesia of skin: Secondary | ICD-10-CM | POA: Diagnosis not present

## 2020-03-18 DIAGNOSIS — M1990 Unspecified osteoarthritis, unspecified site: Secondary | ICD-10-CM | POA: Diagnosis not present

## 2020-04-05 ENCOUNTER — Other Ambulatory Visit: Payer: Self-pay | Admitting: Neurology

## 2020-06-19 DIAGNOSIS — D72829 Elevated white blood cell count, unspecified: Secondary | ICD-10-CM | POA: Diagnosis not present

## 2020-06-19 DIAGNOSIS — D72819 Decreased white blood cell count, unspecified: Secondary | ICD-10-CM | POA: Diagnosis not present

## 2020-06-19 DIAGNOSIS — Z125 Encounter for screening for malignant neoplasm of prostate: Secondary | ICD-10-CM | POA: Diagnosis not present

## 2020-06-19 DIAGNOSIS — M109 Gout, unspecified: Secondary | ICD-10-CM | POA: Diagnosis not present

## 2020-06-19 DIAGNOSIS — E785 Hyperlipidemia, unspecified: Secondary | ICD-10-CM | POA: Diagnosis not present

## 2020-06-26 DIAGNOSIS — M25512 Pain in left shoulder: Secondary | ICD-10-CM | POA: Diagnosis not present

## 2020-06-26 DIAGNOSIS — Z1389 Encounter for screening for other disorder: Secondary | ICD-10-CM | POA: Diagnosis not present

## 2020-06-26 DIAGNOSIS — Z0000 Encounter for general adult medical examination without abnormal findings: Secondary | ICD-10-CM | POA: Diagnosis not present

## 2020-06-26 DIAGNOSIS — E785 Hyperlipidemia, unspecified: Secondary | ICD-10-CM | POA: Diagnosis not present

## 2020-06-26 DIAGNOSIS — Z1331 Encounter for screening for depression: Secondary | ICD-10-CM | POA: Diagnosis not present

## 2020-06-26 DIAGNOSIS — R82998 Other abnormal findings in urine: Secondary | ICD-10-CM | POA: Diagnosis not present

## 2020-07-02 DIAGNOSIS — Z1212 Encounter for screening for malignant neoplasm of rectum: Secondary | ICD-10-CM | POA: Diagnosis not present

## 2020-07-07 ENCOUNTER — Ambulatory Visit (HOSPITAL_COMMUNITY)
Admission: RE | Admit: 2020-07-07 | Discharge: 2020-07-07 | Disposition: A | Discharge status: Home / Self Care | Payer: BC Managed Care – PPO | Source: Ambulatory Visit | Attending: Internal Medicine | Admitting: Internal Medicine

## 2020-07-07 ENCOUNTER — Other Ambulatory Visit: Payer: Self-pay

## 2020-07-07 ENCOUNTER — Other Ambulatory Visit (HOSPITAL_COMMUNITY): Payer: Self-pay | Admitting: Internal Medicine

## 2020-07-07 DIAGNOSIS — I6521 Occlusion and stenosis of right carotid artery: Secondary | ICD-10-CM | POA: Insufficient documentation

## 2020-08-20 DIAGNOSIS — M542 Cervicalgia: Secondary | ICD-10-CM | POA: Diagnosis not present

## 2020-08-24 DIAGNOSIS — M542 Cervicalgia: Secondary | ICD-10-CM | POA: Diagnosis not present

## 2020-08-25 DIAGNOSIS — M542 Cervicalgia: Secondary | ICD-10-CM | POA: Diagnosis not present

## 2020-09-03 DIAGNOSIS — M542 Cervicalgia: Secondary | ICD-10-CM | POA: Diagnosis not present

## 2020-09-15 DIAGNOSIS — M542 Cervicalgia: Secondary | ICD-10-CM | POA: Diagnosis not present

## 2020-09-17 DIAGNOSIS — M542 Cervicalgia: Secondary | ICD-10-CM | POA: Diagnosis not present

## 2020-09-22 DIAGNOSIS — M542 Cervicalgia: Secondary | ICD-10-CM | POA: Diagnosis not present

## 2020-09-24 DIAGNOSIS — M542 Cervicalgia: Secondary | ICD-10-CM | POA: Diagnosis not present

## 2020-09-28 DIAGNOSIS — D225 Melanocytic nevi of trunk: Secondary | ICD-10-CM | POA: Diagnosis not present

## 2020-09-28 DIAGNOSIS — C44319 Basal cell carcinoma of skin of other parts of face: Secondary | ICD-10-CM | POA: Diagnosis not present

## 2020-09-28 DIAGNOSIS — I83009 Varicose veins of unspecified lower extremity with ulcer of unspecified site: Secondary | ICD-10-CM | POA: Diagnosis not present

## 2020-09-28 DIAGNOSIS — C44310 Basal cell carcinoma of skin of unspecified parts of face: Secondary | ICD-10-CM | POA: Diagnosis not present

## 2020-09-28 DIAGNOSIS — L821 Other seborrheic keratosis: Secondary | ICD-10-CM | POA: Diagnosis not present

## 2020-09-28 DIAGNOSIS — C44519 Basal cell carcinoma of skin of other part of trunk: Secondary | ICD-10-CM | POA: Diagnosis not present

## 2020-10-30 DIAGNOSIS — M542 Cervicalgia: Secondary | ICD-10-CM | POA: Diagnosis not present

## 2020-11-16 DIAGNOSIS — M542 Cervicalgia: Secondary | ICD-10-CM | POA: Diagnosis not present

## 2020-12-04 DIAGNOSIS — M542 Cervicalgia: Secondary | ICD-10-CM | POA: Diagnosis not present

## 2020-12-29 DIAGNOSIS — Z1331 Encounter for screening for depression: Secondary | ICD-10-CM | POA: Diagnosis not present

## 2020-12-29 DIAGNOSIS — M25512 Pain in left shoulder: Secondary | ICD-10-CM | POA: Diagnosis not present

## 2021-01-07 DIAGNOSIS — M542 Cervicalgia: Secondary | ICD-10-CM | POA: Diagnosis not present

## 2021-02-18 DIAGNOSIS — M542 Cervicalgia: Secondary | ICD-10-CM | POA: Diagnosis not present

## 2021-06-23 DIAGNOSIS — E559 Vitamin D deficiency, unspecified: Secondary | ICD-10-CM | POA: Diagnosis not present

## 2021-06-23 DIAGNOSIS — Z125 Encounter for screening for malignant neoplasm of prostate: Secondary | ICD-10-CM | POA: Diagnosis not present

## 2021-06-23 DIAGNOSIS — E785 Hyperlipidemia, unspecified: Secondary | ICD-10-CM | POA: Diagnosis not present

## 2021-06-23 DIAGNOSIS — M109 Gout, unspecified: Secondary | ICD-10-CM | POA: Diagnosis not present

## 2021-06-30 DIAGNOSIS — Z0000 Encounter for general adult medical examination without abnormal findings: Secondary | ICD-10-CM | POA: Diagnosis not present

## 2021-06-30 DIAGNOSIS — Z23 Encounter for immunization: Secondary | ICD-10-CM | POA: Diagnosis not present

## 2021-06-30 DIAGNOSIS — Z1331 Encounter for screening for depression: Secondary | ICD-10-CM | POA: Diagnosis not present

## 2021-06-30 DIAGNOSIS — M25512 Pain in left shoulder: Secondary | ICD-10-CM | POA: Diagnosis not present

## 2021-06-30 DIAGNOSIS — R82998 Other abnormal findings in urine: Secondary | ICD-10-CM | POA: Diagnosis not present

## 2021-06-30 DIAGNOSIS — Z1339 Encounter for screening examination for other mental health and behavioral disorders: Secondary | ICD-10-CM | POA: Diagnosis not present

## 2021-06-30 DIAGNOSIS — R7303 Prediabetes: Secondary | ICD-10-CM | POA: Diagnosis not present

## 2021-07-09 DIAGNOSIS — B029 Zoster without complications: Secondary | ICD-10-CM | POA: Diagnosis not present

## 2021-07-27 DIAGNOSIS — B0229 Other postherpetic nervous system involvement: Secondary | ICD-10-CM | POA: Diagnosis not present

## 2021-07-27 DIAGNOSIS — Z1339 Encounter for screening examination for other mental health and behavioral disorders: Secondary | ICD-10-CM | POA: Diagnosis not present

## 2021-08-06 DIAGNOSIS — B0239 Other herpes zoster eye disease: Secondary | ICD-10-CM | POA: Diagnosis not present

## 2021-12-17 DIAGNOSIS — H524 Presbyopia: Secondary | ICD-10-CM | POA: Diagnosis not present

## 2021-12-17 DIAGNOSIS — H2513 Age-related nuclear cataract, bilateral: Secondary | ICD-10-CM | POA: Diagnosis not present

## 2022-07-13 DIAGNOSIS — M109 Gout, unspecified: Secondary | ICD-10-CM | POA: Diagnosis not present

## 2022-07-13 DIAGNOSIS — R7989 Other specified abnormal findings of blood chemistry: Secondary | ICD-10-CM | POA: Diagnosis not present

## 2022-07-13 DIAGNOSIS — E559 Vitamin D deficiency, unspecified: Secondary | ICD-10-CM | POA: Diagnosis not present

## 2022-07-13 DIAGNOSIS — D649 Anemia, unspecified: Secondary | ICD-10-CM | POA: Diagnosis not present

## 2022-07-13 DIAGNOSIS — D640 Hereditary sideroblastic anemia: Secondary | ICD-10-CM | POA: Diagnosis not present

## 2022-07-13 DIAGNOSIS — R7303 Prediabetes: Secondary | ICD-10-CM | POA: Diagnosis not present

## 2022-07-13 DIAGNOSIS — E785 Hyperlipidemia, unspecified: Secondary | ICD-10-CM | POA: Diagnosis not present

## 2022-07-13 DIAGNOSIS — Z125 Encounter for screening for malignant neoplasm of prostate: Secondary | ICD-10-CM | POA: Diagnosis not present

## 2022-07-13 DIAGNOSIS — I10 Essential (primary) hypertension: Secondary | ICD-10-CM | POA: Diagnosis not present

## 2022-07-22 ENCOUNTER — Other Ambulatory Visit (HOSPITAL_COMMUNITY): Payer: Self-pay | Admitting: Internal Medicine

## 2022-07-22 DIAGNOSIS — I10 Essential (primary) hypertension: Secondary | ICD-10-CM | POA: Diagnosis not present

## 2022-07-22 DIAGNOSIS — B0229 Other postherpetic nervous system involvement: Secondary | ICD-10-CM | POA: Diagnosis not present

## 2022-07-22 DIAGNOSIS — Z0000 Encounter for general adult medical examination without abnormal findings: Secondary | ICD-10-CM | POA: Diagnosis not present

## 2022-07-22 DIAGNOSIS — G4489 Other headache syndrome: Secondary | ICD-10-CM

## 2022-07-22 DIAGNOSIS — Z1331 Encounter for screening for depression: Secondary | ICD-10-CM | POA: Diagnosis not present

## 2022-07-22 DIAGNOSIS — G629 Polyneuropathy, unspecified: Secondary | ICD-10-CM | POA: Diagnosis not present

## 2022-07-22 DIAGNOSIS — R7303 Prediabetes: Secondary | ICD-10-CM | POA: Diagnosis not present

## 2022-07-22 DIAGNOSIS — Z1339 Encounter for screening examination for other mental health and behavioral disorders: Secondary | ICD-10-CM | POA: Diagnosis not present

## 2022-09-20 DIAGNOSIS — R053 Chronic cough: Secondary | ICD-10-CM | POA: Diagnosis not present

## 2023-01-23 DIAGNOSIS — I10 Essential (primary) hypertension: Secondary | ICD-10-CM | POA: Diagnosis not present

## 2023-01-23 DIAGNOSIS — D649 Anemia, unspecified: Secondary | ICD-10-CM | POA: Diagnosis not present

## 2023-01-23 DIAGNOSIS — G629 Polyneuropathy, unspecified: Secondary | ICD-10-CM | POA: Diagnosis not present

## 2023-01-23 DIAGNOSIS — R053 Chronic cough: Secondary | ICD-10-CM | POA: Diagnosis not present

## 2023-01-24 DIAGNOSIS — L400 Psoriasis vulgaris: Secondary | ICD-10-CM | POA: Diagnosis not present

## 2023-02-06 ENCOUNTER — Institutional Professional Consult (permissible substitution): Payer: BC Managed Care – PPO | Admitting: Internal Medicine

## 2023-02-06 NOTE — Progress Notes (Deleted)
OV 02/06/2023  Subjective:  Patient ID: David Mcgrath, male , DOB: 1950/05/08 , age 72 y.o. , MRN: [id number] , ADDRESS: [address] [place] [address] PCP Charlane Ferretti, DO Patient Care Team: Charlane Ferretti, DO as PCP - General (Internal Medicine)  This Provider for this visit: Treatment Team:  Attending Provider: Kalman Shan, MD    02/06/2023 -  No chief complaint on file.    HPI Elchanan Mcgrath 72 y.o. -    CT Chest data from date: ****  - personally visualized and independently interpreted : *** - my findings are: ***  Narrative & Impression  CLINICAL DATA:  Chronic sinusitis and infections. No relief with antibiotics.   EXAM: CT MAXILLOFACIAL WITHOUT CONTRAST   TECHNIQUE: Multidetector CT images of the paranasal sinuses were obtained using the standard protocol without intravenous contrast.   COMPARISON:  None.   FINDINGS: Paranasal sinuses:   Frontal: Normally aerated. Patent frontal sinus drainage pathways.   Ethmoid: Normally aerated. A prominent ethmoid bulla is present bilaterally without obstruction.   Maxillary: Normally aerated.   Sphenoid: The left sphenoid sinus is chronically opacified. Wall thickening is noted. There is soft tissue with calcification extending into the posterior ethmoid with widening of the sphenoid ethmoidal recess. There is marked thinning or dehiscence of the roof of the left sphenoid sinus. This may communicate with the sella turcica. The left sphenoid sinus is expanded. Mucocele is suspected.   Right ostiomeatal unit: Patent.   Left ostiomeatal unit: Patent.   Nasal passages: The nasal cavity is clear. Rightward nasal septal spurring extends 11 mm to the right, displacing the right middle turbinate without obstruction.   Anatomy: No pneumatization superior to anterior ethmoid notches. Symmetric and intact olfactory grooves and fovea ethmoidalis, Keros I (1-79mm). Sellar sphenoid pneumatization  pattern. No dehiscence of carotid or optic canals. No onodi cell.   Other: Orbits and intracranial compartment are unremarkable. Visible mastoid air cells are normally aerated.   IMPRESSION: 1. 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 2. Marked thinning or dehiscence of the roof of the left sphenoid sinus with possible communication into the sella turcica. 3. The paranasal sinuses and mastoid air cells are otherwise clear. 4. Rightward nasal septal spurring as described. 5. Prominent ethmoid bulla bilaterally without obstruction.     Electronically Signed   By: Marin Roberts M.D.   On: 11/14/2016 08:31    Latest Reference Range & Units 01/09/20 14:45  Anti Nuclear Antibody (ANA) Negative  Positive !  RA Latex Turbid. 0.0 - 13.9 IU/mL 10.0  !: Data is abnormal  Latest Reference Range & Units 01/09/20 14:45  Angio Convert Enzyme 14 - 82 U/L 68       No data to display            LAB RESULTS last 96 hours No results found.  LAB RESULTS last 90 days No results found for this or any previous visit (from the past 2160 hour(s)).       has a past medical history of Neuropathy.   reports that he has never smoked. He has never used smokeless tobacco.  No past surgical history on file.  Allergies  Allergen Reactions   Shrimp [Shellfish Allergy]     Immunization History  Administered Date(s) Administered   PFIZER(Purple Top)SARS-COV-2 Vaccination 05/18/2019, 06/09/2019    Family History  Problem Relation Age of Onset   Diabetes Mother    Cancer Mother  Heart disease Mother    Cancer Father    Hyperlipidemia Father      Current Outpatient Medications:    allopurinol (ZYLOPRIM) 300 MG tablet, allopurinol 300 mg tablet, Disp: , Rfl:    ezetimibe (ZETIA) 10 MG tablet, Take 10 mg by mouth daily., Disp: , Rfl:    FLUoxetine (PROZAC) 10 MG tablet, Take 10 mg by mouth 2 (two) times daily., Disp: , Rfl:     indomethacin (INDOCIN) 50 MG capsule, daily as needed. , Disp: , Rfl:    olmesartan (BENICAR) 20 MG tablet, Take 20 mg by mouth daily., Disp: , Rfl:    topiramate (TOPAMAX) 25 MG tablet, Take 1 tablet (25 mg total) by mouth 2 (two) times daily. Must be seen for further refills. Call [phone]., Disp: 60 tablet, Rfl: 0      Objective:   There were no vitals filed for this visit.  Estimated body mass index is 31.65 kg/m as calculated from the following:   Height as of 01/09/20: 6\' 4"  (1.93 m).   Weight as of 01/09/20: 260 lb (117.9 kg).  @WEIGHTCHANGE @  There were no vitals filed for this visit.   Physical Exam   General: No distress. *** O2 at rest: *** Cane present: *** Sitting in wheel chair: *** Frail: *** Obese: *** Neuro: Alert and Oriented x 3. GCS 15. Speech normal Psych: Pleasant Resp:  Barrel Chest - ***.  Wheeze - ***, Crackles - ***, No overt respiratory distress CVS: Normal heart sounds. Murmurs - *** Ext: Stigmata of Connective Tissue Disease - *** HEENT: Normal upper airway. PEERL +. No post nasal drip        Assessment:     No diagnosis found.     Plan:     There are no Patient Instructions on file for this visit.   FOLLOWUP No follow-ups on file.    SIGNATURE    Dr. Kalman Shan, M.D., F.C.C.P,  Pulmonary and Critical Care Medicine Staff Physician, [facility] Center Director - Interstitial Lung Disease  Program  Pulmonary Fibrosis [facility] at [facility] [place], [place], [address]  Pager: [phone], If no answer or between  15:00h - 7:00h: call [phone] Telephone: [phone]  8:43 AM 02/06/2023   Moderate Complexity MDM OFFICE  2021 E/M guidelines, first released in 2021, with minor revisions added in 2023 and 2024 Must meet the requirements for 2 out of 3 dimensions to qualify.    Number and complexity of problems addressed Amount and/or complexity of data reviewed Risk of  complications and/or morbidity  One or more chronic illness with mild exacerbation, OR progression, OR  side effects of treatment  Two or more stable chronic illnesses  One undiagnosed new problem with uncertain prognosis  One acute illness with systemic symptoms   One Acute complicated injury Must meet the requirements for 1 of 3 of the categories)  Category 1: Tests and documents, historian  Any combination of 3 of the following:  Assessment requiring an independent historian  Review of prior external note(s) from each unique source  Review of results of each unique test  Ordering of each unique test    Category 2: Interpretation of tests   Independent interpretation of a test performed by another physician/other qualified health care professional (not separately reported)  Category 3: Discuss management/tests  Discussion of management or test interpretation with external physician/other qualified health care professional/appropriate source (not separately reported) Moderate risk  of morbidity from additional diagnostic testing or treatment Examples only:  Prescription drug management  Decision regarding minor surgery with identfied patient or procedure risk factors  Decision regarding elective major surgery without identified patient or procedure risk factors  Diagnosis or treatment significantly limited by social determinants of health             HIGh Complexity  OFFICE   2021 E/M guidelines, first released in 2021, with minor revisions added in 2023. Must meet the requirements for 2 out of 3 dimensions to qualify.    Number and complexity of problems addressed Amount and/or complexity of data reviewed Risk of complications and/or morbidity  Severe exacerbation of chronic illness  Acute or chronic illnesses that may pose a threat to life or bodily function, Davidg., multiple trauma, acute MI, pulmonary embolus, severe respiratory distress, progressive  rheumatoid arthritis, psychiatric illness with potential threat to self or others, peritonitis, acute renal failure, abrupt change in neurological status Must meet the requirements for 2 of 3 of the categories)  Category 1: Tests and documents, historian  Any combination of 3 of the following:  Assessment requiring an independent historian  Review of prior external note(s) from each unique source  Review of results of each unique test  Ordering of each unique test    Category 2: Interpretation of tests    Independent interpretation of a test performed by another physician/other qualified health care professional (not separately reported)  Category 3: Discuss management/tests  Discussion of management or test interpretation with external physician/other qualified health care professional/appropriate source (not separately reported)  HIGH risk of morbidity from additional diagnostic testing or treatment Examples only:  Drug therapy requiring intensive monitoring for toxicity  Decision for elective major surgery with identified pateint or procedure risk factors  Decision regarding hospitalization or escalation of level of care  Decision for DNR or to de-escalate care   Parenteral controlled  substances            LEGEND - Independent interpretation involves the interpretation of a test for which there is a CPT code, and an interpretation or report is customary. When a review and interpretation of a test is performed and documented by the provider, but not separately reported (billed), then this would represent an independent interpretation. This report does not need to conform to the usual standards of a complete report of the test. This does not include interpretation of tests that do not have formal reports such as a complete blood count with differential and blood cultures. Examples would include reviewing a chest radiograph and documenting in the medical record an  interpretation, but not separately reporting (billing) the interpretation of the chest radiograph.   An appropriate source includes professionals who are not health care professionals but may be involved in the management of the patient, such as a Clinical research associate, upper officer, case manager or teacher, and does not include discussion with family or informal caregivers.    - SDOH: SDOH are the conditions in the environments where people are born, live, learn, work, play, worship, and age that affect a wide range of health, functioning, and quality-of-life outcomes and risks. (Davidg., housing, food insecurity, transportation, etc.). SDOH-related Z codes ranging from Z55-Z65 are the ICD-10-CM diagnosis codes used to document SDOH data Z55 - Problems related to education and literacy Z56 - Problems related to employment and unemployment Z57 - Occupational exposure to risk factors Z58 - Problems related to physical environment Z59 - Problems related to housing and economic  circumstances Z60 - Problems related to social environment [phone] - Problems related to upbringing [phone] - Other problems related to primary support group, including family circumstances Z90 - Problems related to certain psychosocial circumstances Z65 - Problems related to other psychosocial circumstances

## 2023-02-15 DIAGNOSIS — B0229 Other postherpetic nervous system involvement: Secondary | ICD-10-CM | POA: Diagnosis not present

## 2023-02-15 DIAGNOSIS — R053 Chronic cough: Secondary | ICD-10-CM | POA: Diagnosis not present

## 2023-02-17 ENCOUNTER — Other Ambulatory Visit: Payer: Self-pay | Admitting: Internal Medicine

## 2023-02-17 DIAGNOSIS — J3489 Other specified disorders of nose and nasal sinuses: Secondary | ICD-10-CM

## 2023-02-21 ENCOUNTER — Encounter: Payer: Self-pay | Admitting: Internal Medicine

## 2023-02-22 ENCOUNTER — Inpatient Hospital Stay
Admission: RE | Admit: 2023-02-22 | Discharge: 2023-02-22 | Discharge status: Home / Self Care | Payer: BC Managed Care – PPO | Source: Ambulatory Visit | Attending: Internal Medicine

## 2023-02-22 DIAGNOSIS — J3489 Other specified disorders of nose and nasal sinuses: Secondary | ICD-10-CM

## 2023-02-22 DIAGNOSIS — R053 Chronic cough: Secondary | ICD-10-CM | POA: Diagnosis not present

## 2023-02-22 DIAGNOSIS — J342 Deviated nasal septum: Secondary | ICD-10-CM | POA: Diagnosis not present

## 2023-03-10 DIAGNOSIS — H524 Presbyopia: Secondary | ICD-10-CM | POA: Diagnosis not present

## 2023-03-14 ENCOUNTER — Ambulatory Visit: Payer: Medicare Other | Admitting: Neurology

## 2023-03-14 ENCOUNTER — Encounter: Payer: Self-pay | Admitting: Neurology

## 2023-03-14 ENCOUNTER — Telehealth: Payer: Self-pay | Admitting: Neurology

## 2023-03-14 VITALS — BP 124/67 | HR 89 | Ht 76.0 in | Wt 253.0 lb

## 2023-03-14 DIAGNOSIS — M792 Neuralgia and neuritis, unspecified: Secondary | ICD-10-CM | POA: Diagnosis not present

## 2023-03-14 DIAGNOSIS — R260 Ataxic gait: Secondary | ICD-10-CM | POA: Diagnosis not present

## 2023-03-14 DIAGNOSIS — R413 Other amnesia: Secondary | ICD-10-CM

## 2023-03-14 DIAGNOSIS — G3184 Mild cognitive impairment, so stated: Secondary | ICD-10-CM | POA: Diagnosis not present

## 2023-03-14 DIAGNOSIS — B0229 Other postherpetic nervous system involvement: Secondary | ICD-10-CM | POA: Diagnosis not present

## 2023-03-14 MED ORDER — CEREFOLIN 6-1-50-5 MG PO TABS: 1.0000 | ORAL_TABLET | Freq: Every day | ORAL | 3 refills | Status: DC

## 2023-03-14 MED ORDER — GABAPENTIN 300 MG PO CAPS: 300.0000 mg | ORAL_CAPSULE | Freq: Two times a day (BID) | ORAL | 11 refills | Status: DC

## 2023-03-14 NOTE — Patient Instructions (Signed)
I had a long discussion with the patient regarding his longstanding symptoms of paresthesias and gait and balance difficulties due to chronic idiopathic peripheral sensory polyneuropathy as well as new complaints of memory loss and mild cognitive impairment which appears to be age-appropriate.  I recommend further evaluation by checking memory panel labs, EEG and MRI scan.  Trial of Cerefolin NAC 1 tablet daily to help with both cognitive impairment as well as neuropathy pain.  I also encouraged him to increase participation in cognitively challenging activities like solving crossword puzzles, playing bridge and sudoku.  We also could discussed memory compensation strategies.  I recommend increase gabapentin to 300 mg twice daily to help with neuropathic pain and if tolerated may increase further.  We also discussed fall prevention precautions.  He will return for follow-up in the future in 4 months or call earlier if necessary.  Memory Compensation Strategies  Use "WARM" strategy.  W= write it down  A= associate it  R= repeat it  M= make a mental note  2.   You can keep a Glass blower/designer.  Use a 3-ring notebook with sections for the following: calendar, important names and phone numbers,  medications, doctors' names/phone numbers, lists/reminders, and a section to journal what you did  each day.   3.    Use a calendar to write appointments down.  4.    Write yourself a schedule for the day.  This can be placed on the calendar or in a separate section of the Memory Notebook.  Keeping a  regular schedule can help memory.  5.    Use medication organizer with sections for each day or morning/evening pills.  You may need help loading it  6.    Keep a basket, or pegboard by the door.  Place items that you need to take out with you in the basket or on the pegboard.  You may also want to  include a message board for reminders.  7.    Use sticky notes.  Place sticky notes with reminders in a place  where the task is performed.  For example: " turn off the  stove" placed by the stove, "lock the door" placed on the door at eye level, " take your medications" on  the bathroom mirror or by the place where you normally take your medications.  8.    Use alarms/timers.  Use while cooking to remind yourself to check on food or as a reminder to take your medicine, or as a  reminder to make a call, or as a reminder to perform another task, etc. Fall Prevention in the Home, Adult Falls can cause injuries and can happen to people of all ages. There are many things you can do to make your home safer and to help prevent falls. What actions can I take to prevent falls? General information Use good lighting in all rooms. Make sure to: Replace any light bulbs that burn out. Turn on the lights in dark areas and use night-lights. Keep items that you use often in easy-to-reach places. Lower the shelves around your home if needed. Move furniture so that there are clear paths around it. Do not use throw rugs or other things on the floor that can make you trip. If any of your floors are uneven, fix them. Add color or contrast paint or tape to clearly mark and help you see: Grab bars or handrails. First and last steps of staircases. Where the edge of each step is.  If you use a ladder or stepladder: Make sure that it is fully opened. Do not climb a closed ladder. Make sure the sides of the ladder are locked in place. Have someone hold the ladder while you use it. Know where your pets are as you move through your home. What can I do in the bathroom?     Keep the floor dry. Clean up any water on the floor right away. Remove soap buildup in the bathtub or shower. Buildup makes bathtubs and showers slippery. Use non-skid mats or decals on the floor of the bathtub or shower. Attach bath mats securely with double-sided, non-slip rug tape. If you need to sit down in the shower, use a non-slip stool. Install  grab bars by the toilet and in the bathtub and shower. Do not use towel bars as grab bars. What can I do in the bedroom? Make sure that you have a light by your bed that is easy to reach. Do not use any sheets or blankets on your bed that hang to the floor. Have a firm chair or bench with side arms that you can use for support when you get dressed. What can I do in the kitchen? Clean up any spills right away. If you need to reach something above you, use a step stool with a grab bar. Keep electrical cords out of the way. Do not use floor polish or wax that makes floors slippery. What can I do with my stairs? Do not leave anything on the stairs. Make sure that you have a light switch at the top and the bottom of the stairs. Make sure that there are handrails on both sides of the stairs. Fix handrails that are broken or loose. Install non-slip stair treads on all your stairs if they do not have carpet. Avoid having throw rugs at the top or bottom of the stairs. Choose a carpet that does not hide the edge of the steps on the stairs. Make sure that the carpet is firmly attached to the stairs. Fix carpet that is loose or worn. What can I do on the outside of my home? Use bright outdoor lighting. Fix the edges of walkways and driveways and fix any cracks. Clear paths of anything that can make you trip, such as tools or rocks. Add color or contrast paint or tape to clearly mark and help you see anything that might make you trip as you walk through a door, such as a raised step or threshold. Trim any bushes or trees on paths to your home. Check to see if handrails are loose or broken and that both sides of all steps have handrails. Install guardrails along the edges of any raised decks and porches. Have leaves, snow, or ice cleared regularly. Use sand, salt, or ice melter on paths if you live where there is ice and snow during the winter. Clean up any spills in your garage right away. This includes  grease or oil spills. What other actions can I take? Review your medicines with your doctor. Some medicines can cause dizziness or changes in blood pressure, which increase your risk of falling. Wear shoes that: Have a low heel. Do not wear high heels. Have rubber bottoms and are closed at the toe. Feel good on your feet and fit well. Use tools that help you move around if needed. These include: Canes. Walkers. Scooters. Crutches. Ask your doctor what else you can do to help prevent falls. This may include seeing a  physical therapist to learn to do exercises to move better and get stronger. Where to find more information Centers for Disease Control and Prevention, STEADI: TonerPromos.no [employer] on Aging: BaseRingTones.pl National Institute on Aging: BaseRingTones.pl Contact a doctor if: You are afraid of falling at home. You feel weak, drowsy, or dizzy at home. You fall at home. Get help right away if you: Lose consciousness or have trouble moving after a fall. Have a fall that causes a head injury. These symptoms may be an emergency. Get help right away. Call 911. Do not wait to see if the symptoms will go away. Do not drive yourself to the hospital. This information is not intended to replace advice given to you by your health care provider. Make sure you discuss any questions you have with your health care provider. Document Revised: 11/22/2021 Document Reviewed: 11/22/2021 Elsevier Patient Education  2024 [employer].

## 2023-03-14 NOTE — Telephone Encounter (Signed)
Pt has questions regarding AVS notes, states information is different from what was talked about at the appt. Requesting call back- Morrie Sheldon

## 2023-03-14 NOTE — Progress Notes (Signed)
[facility] [address] [place]. [place] [address] [phone]       OFFICE CONSULT NOTE  [name] Date of Birth:  04/11/1950 Medical Record Number:  [id number]   Referring MD: Charlane Ferretti  Reason for Referral: Worsening neuropathy and memory loss  HPI: [name] is a 72 year old Caucasian male seen today for office consultation visit upon request from his primary.  History is obtained from him and review of electronic medical records and I personally reviewed pertinent available imaging films in PACS.  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  He had extensive evaluation at that time and saw orthopedic surgery as well as neurology for EMG confirmation of poly neuropathy.  This remained mostly stable over the years and he saw me in 2021 for worsening paresthesias at that time.  EMG nerve conduction study done on 01/30/2020 by Dr. Marjory Lies confirms severe axonal sensorimotor polyneuropathy as well as moderate right carpal tunnel.  Neuropathy panel labs at that time were significant only for low vitamin B12 levels and positive ANA.  I have prescribed Topamax but patient states it did not help and normally does not like the side effects and he discontinued it.  He has been on vitamin D management by his primary care physician since then.  He has had bilateral carpal tunnel surgery done several years ago with good relief of symptoms.  Over the last several months has noticed worsening paresthesias in gait and balance problems.  He had a recent fall and fell face forward when he was carrying some objects in his hand.  He has been taking gabapentin 200 mg twice daily for the last couple of years it helps only partially.  He has not tried Lyrica or Tegretol or any other medications.  He has also noticed some wasting and weakness in his hands and at times drops 6.  He has no trouble climbing steps or going down the steps.   He is aware that his balance is not good and he has to be careful and walks slowly.  He started using a cane while walking long distances last few years.  Patient is also complaining of memory difficulties and complaints of brain fog.  Last several months his noticed difficulty with multitasking and doing activities.  He is still independent in all activities of daily living.  He lives at home with his wife.  He is still driving.  He denies any headache, significant head injury with loss of consciousness, seizures.  No prior history of strokes, TIAs.  He does not have any family history of Alzheimer's dementia.  He has not had any lab work for evaluation for reversible causes of memory loss  ROS:   14 system review of systems is positive for memory loss, difficulty multitasking, numbness, tingling, imbalance, walking difficulty, weakness all other systems negative  PMH:  Past Medical History:  Diagnosis Date   Neuropathy     Social History:  Social History   Socioeconomic History   Marital status: Married    Spouse name: Lanora Manis   Number of children: 0   Years of education: Not on file   Highest education level: Not on file  Occupational History   Occupation: Event organiser  Tobacco Use   Smoking status: Never   Smokeless tobacco: Never  Substance and Sexual Activity   Alcohol use: Yes    Comment: socialy   Drug use: No   Sexual activity: Not on file  Other Topics Concern   Not on file  Social History Narrative   Lives with spouse   Right handed   Drinks 4+ cups of caffeine daily   Retired    International aid/development worker of [employer] Strain: Not on file  Food Insecurity: Not on file  Transportation Needs: Not on file  Physical Activity: Not on file  Stress: Not on file  Social Connections: Not on file  Intimate Partner Violence: Not on file    Medications:   Current Outpatient Medications on File Prior to Visit  Medication Sig Dispense Refill   allopurinol  (ZYLOPRIM) 300 MG tablet allopurinol 300 mg tablet     cholecalciferol (VITAMIN D3) 25 MCG (1000 UNIT) tablet Take 5,000 Units by mouth daily.     Cyanocobalamin (VITAMIN B12) 1000 MCG TBCR 1 tablet Orally Once a day     ezetimibe (ZETIA) 10 MG tablet Take 10 mg by mouth daily.     FLUoxetine (PROZAC) 10 MG tablet Take 20 mg by mouth 2 (two) times daily.     gabapentin (NEURONTIN) 300 MG capsule 100 mg 2 (two) times daily. Pt takes 2 (100mg ) capsules in am and 2 in PM     MELATONIN PO Take 5 mg by mouth.     olmesartan (BENICAR) 20 MG tablet Take 10 mg by mouth daily.     pravastatin (PRAVACHOL) 80 MG tablet Take 80 mg by mouth at bedtime.     pyridoxine (B-6) 100 MG tablet 1 tablet Orally Once a day     topiramate (TOPAMAX) 25 MG tablet Take 1 tablet (25 mg total) by mouth 2 (two) times daily. Must be seen for further refills. Call [phone]. 60 tablet 0   indomethacin (INDOCIN) 50 MG capsule daily as needed.  (Patient not taking: Reported on 03/14/2023)     No current facility-administered medications on file prior to visit.    Allergies:   Allergies  Allergen Reactions   Shrimp [Shellfish Allergy]     Physical Exam General: well developed, well nourished, seated, in no evident distress Head: head normocephalic and atraumatic.   Neck: supple with no carotid or supraclavicular bruits Cardiovascular: regular rate and rhythm, no murmurs Musculoskeletal: no deformity Skin:  no rash/petichiae Vascular:  Normal pulses all extremities  Neurologic Exam Mental Status: Awake and fully alert. Oriented to place and time. Recent and remote memory intact. Attention span, concentration and fund of knowledge appropriate. Mood and affect appropriate.  Diminished recall 0/3.  Able to name only 8 animals which can walk on 4 legs.  Clock drawing 4/4.  MMSE score 25/30.  Geriatric depression scale 1 not depressed Cranial Nerves: Fundoscopic exam reveals sharp disc margins. Pupils equal, briskly  reactive to light. Extraocular movements full without nystagmus. Visual fields full to confrontation. Hearing intact. Facial sensation intact. Face, tongue, palate moves normally and symmetrically.  Motor: Wasting in intrinsic hand muscles with weakness bilaterally.  Bilateral ankle foot drop with significant ankle dorsiflexor and plantar flexor weakness bilaterally.   Sensory.: intact to touch , pinprick , but diminished position and vibratory sensation bilaterally from ankle down..  Coordination: Rapid alternating movements normal in all extremities. Finger-to-nose and heel-to-shin performed accurately bilaterally. Gait and Station: Arises from chair without difficulty. Stance is broad-based.  Gait is slightly ataxic with bilateral foot drop.  Unsteady while standing on a narrow base.  Romberg sign positive.  Difficulty in standing on either foot unsupported. Reflexes: 1+ and symmetric except ankle jerks are depressed right  more than left.. Toes downgoing.        03/14/2023    9:00 AM  MMSE - Mini Mental State Exam  Orientation to time 5  Orientation to Place 5  Registration 3  Attention/ Calculation 2  Recall 2  Language- name 2 objects 2  Language- repeat 1  Language- follow 3 step command 3  Language- read & follow direction 1  Write a sentence 1  Copy design 0  Total score 25      ASSESSMENT: 72 year old Caucasian male with chronic longstanding lower extremity paresthesias and gait and balance difficulties due to chronic idiopathic sensory polyneuropathy.  New complaints of memory loss and cognitive difficulties due to likely mild cognitive impairment.     PLAN:I had a long discussion with the patient regarding his longstanding symptoms of paresthesias and gait and balance difficulties due to chronic idiopathic peripheral sensory polyneuropathy as well as new complaints of memory loss and mild cognitive impairment which appears to be age-appropriate.  I recommend further  evaluation by checking memory panel labs, EEG and MRI scan.  Trial of Cerefolin NAC 1 tablet daily to help with both cognitive impairment as well as neuropathy pain.  I also encouraged him to increase participation in cognitively challenging activities like solving crossword puzzles, playing bridge and sudoku.  We also could discussed memory compensation strategies.  I recommend increase gabapentin to 300 mg twice daily to help with neuropathic pain and if tolerated may increase further.  We also discussed fall prevention precautions.  He will return for follow-up in the future in 4 months or call earlier if necessary.  Greater than 50% time during this 45-minute consultation visit was spent in counseling and coordination of care about his standing peripheral neuropathy and gait and balance difficulties as well as memory loss and mild cognitive impairment  Delia Heady, MD Note: This document was prepared with digital dictation and possible smart phrase technology. Any transcriptional errors that result from this process are unintentional.

## 2023-03-15 LAB — DEMENTIA PANEL
Homocysteine: 7.2 umol/L (ref 0.0–19.2)
RPR Ser Ql: NONREACTIVE
TSH: 5.58 u[IU]/mL — ABNORMAL HIGH (ref 0.450–4.500)
Vitamin B-12: 850 pg/mL (ref 232–1245)

## 2023-03-18 NOTE — Progress Notes (Signed)
Kindly inform the patient that lab work for reversible causes of memory loss was mostly okay except her thyroid is slightly hypoactive and I recommend she see primary care physician for advice on managing this

## 2023-03-20 ENCOUNTER — Telehealth: Payer: Self-pay | Admitting: Neurology

## 2023-03-20 ENCOUNTER — Telehealth: Payer: Self-pay

## 2023-03-20 NOTE — Telephone Encounter (Signed)
I spoke with the patient regarding his gabapentin prescription. He checked his previous prescription bottle of gabapentin for verification. He is currently taking gabapentin 300 mg in the morning, 300 mg at lunch, and 300 mg at bedtime. He is not taking gabapentin as described in the AVS (gabapentin 200 mg BID). He would like to discuss increasing gabapentin further.

## 2023-03-20 NOTE — Telephone Encounter (Signed)
I spoke with the patient and provided the results of the blood work. He will follow up with his PCP for thyroid management. He verbalized understanding of the findings and expressed appreciation for the call.

## 2023-03-20 NOTE — Telephone Encounter (Signed)
[employer] medicare Berkley Harvey: [id number] exp. 03/20/23-04/18/23 sent to GI [phone]

## 2023-03-20 NOTE — Telephone Encounter (Signed)
Noted please see mychart message with the patient where this is further discussed

## 2023-03-20 NOTE — Telephone Encounter (Signed)
I left a voicemail for the patient to return our call to review lab results.

## 2023-03-20 NOTE — Telephone Encounter (Signed)
Error

## 2023-03-20 NOTE — Telephone Encounter (Signed)
Pt has responded to call from [place], [place] for results

## 2023-03-20 NOTE — Telephone Encounter (Signed)
-----   Message from Delia Heady sent at 03/18/2023 11:48 AM EST ----- Kindly inform the patient that lab work for reversible causes of memory loss was mostly okay except her thyroid is slightly hypoactive and I recommend she see primary care physician for advice on managing this

## 2023-03-21 DIAGNOSIS — Z09 Encounter for follow-up examination after completed treatment for conditions other than malignant neoplasm: Secondary | ICD-10-CM | POA: Diagnosis not present

## 2023-03-21 DIAGNOSIS — K648 Other hemorrhoids: Secondary | ICD-10-CM | POA: Diagnosis not present

## 2023-03-21 DIAGNOSIS — Z800 Family history of malignant neoplasm of digestive organs: Secondary | ICD-10-CM | POA: Diagnosis not present

## 2023-03-21 DIAGNOSIS — Z860100 Personal history of colon polyps, unspecified: Secondary | ICD-10-CM | POA: Diagnosis not present

## 2023-03-21 DIAGNOSIS — K5730 Diverticulosis of large intestine without perforation or abscess without bleeding: Secondary | ICD-10-CM | POA: Diagnosis not present

## 2023-03-22 ENCOUNTER — Telehealth: Payer: Self-pay | Admitting: Neurology

## 2023-03-22 NOTE — Telephone Encounter (Signed)
Pt calling in regards to possible dosage change of gabapentin (NEURONTIN) 300 MG capsule. Requesting call back

## 2023-03-22 NOTE — Telephone Encounter (Signed)
Pt called in again today to discuss the Gabapentin dosing and there  Remains to be some miscommunication on how he is taking his Gabapentin. Pt states that he takes 600 mg TID and has been on that dose for years.  This was what he replied in his mychart message but was documented incorrectly when we called and discussed.  The patient has the 300 mg capsules and takes 2 cap TID.  Dr Pearlean Brownie talked about increasing the dose and the patient would like to do that but per the increase recommendation by Dr Pearlean Brownie, it is what the patient is already on. I advised I would take this back to Dr Pearlean Brownie and see what his next recommendation would be.

## 2023-03-23 MED ORDER — GABAPENTIN 300 MG PO CAPS: 600.0000 mg | ORAL_CAPSULE | Freq: Three times a day (TID) | ORAL | 5 refills | Status: DC

## 2023-03-23 NOTE — Telephone Encounter (Signed)
Dr Pearlean Brownie would recommend that patient try increasing it to 600 - 600 - 900 mg x 1 week then 600-900-900 mg x 1 week and if needed then 900 mg three times daily if tolerated

## 2023-04-10 ENCOUNTER — Encounter: Payer: Self-pay | Admitting: Neurology

## 2023-04-10 DIAGNOSIS — R260 Ataxic gait: Secondary | ICD-10-CM

## 2023-04-10 DIAGNOSIS — R2689 Other abnormalities of gait and mobility: Secondary | ICD-10-CM

## 2023-04-10 DIAGNOSIS — M792 Neuralgia and neuritis, unspecified: Secondary | ICD-10-CM

## 2023-04-11 ENCOUNTER — Ambulatory Visit
Admission: RE | Admit: 2023-04-11 | Discharge: 2023-04-11 | Discharge status: Home / Self Care | Payer: Medicare Other | Source: Ambulatory Visit | Attending: Neurology | Admitting: Neurology

## 2023-04-11 DIAGNOSIS — G3184 Mild cognitive impairment, so stated: Secondary | ICD-10-CM | POA: Diagnosis not present

## 2023-04-11 MED ORDER — GADOPICLENOL 0.5 MMOL/ML IV SOLN
10.0000 mL | Freq: Once | INTRAVENOUS | Status: AC | PRN
  Administered 2023-04-11: 10 mL via INTRAVENOUS

## 2023-04-12 NOTE — Telephone Encounter (Signed)
 Noted.

## 2023-04-19 ENCOUNTER — Ambulatory Visit: Payer: Medicare Other | Admitting: Neurology

## 2023-04-19 DIAGNOSIS — G3184 Mild cognitive impairment, so stated: Secondary | ICD-10-CM

## 2023-04-19 DIAGNOSIS — R4182 Altered mental status, unspecified: Secondary | ICD-10-CM

## 2023-04-24 NOTE — Progress Notes (Unsigned)
OV 04/25/2023  Subjective:  Patient ID: David Mcgrath, male , DOB: [birth year] , age 73 y.o. , MRN: [id number] , ADDRESS: [id number] [place] [address] PCP Charlane Ferretti, DO Patient Care Team: Charlane Ferretti, DO as PCP - General (Internal Medicine)  This Provider for this visit: Treatment Team:  Attending Provider: Kalman Shan, MD    04/25/2023 -   Chief Complaint  Patient presents with   Consult    PT states  ongoing dry cough,SOB, sometimes coughs up clear to green fluid mostly in the  am.     HPI David Mcgrath 73 y.o. -as a new consult for this 73 year old gentleman.  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  And then in 1986 he started picking up neuropathy.  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  And since then he has had issues.  Then most recently just a week or so before Christmas 2024 he started getting insidious onset of cough.  Some 4 of his friends have been sick.  1 or 2 of them have recovered now but others have not.  He is also still having the cough.  He is also having some greenish-grayish sputum but sometimes it is whitish.  The cough is considered moderate to severe.  He said 1 antibiotic course by primary care physician in 2 chest x-rays [none of these are available for my review and is just based on his history].  However this is not helped him.  Denies any sick contacts but his friends were sick.  He says most recently with his neuropathy he has started feeling numbness feet he has seen Dr. Pearlean Brownie he had an MRI of the brain that was unremarkable.   In the past has had sinusitis particular in 2018 he had a follow-up CT sinus #2024 because his sinuses are acting up.  His sinuses have been relatively patent.  Reports are in the chart.  His RSI cough score is below and shows significant severity with a cough.  Dr Gretta Cool Reflux Symptom Index (> 13-15  suggestive of LPR cough) 04/25/2023   Hoarseness of problem with voice 0  Clearing  Of Throat 1  Excess throat mucus or feeling of post nasal drip 5  Difficulty swallowing food, liquid or tablets 3  Cough after eating or lying down 3  Breathing difficulties or choking episodes 3  Troublesome or annoying cough 5  Sensation of something sticking in throat or lump in throat 0  Heartburn, chest pain, indigestion, or stomach acid coming up 2  TOTAL 22       PFT      No data to display            LAB RESULTS last 96 hours EEG adult Result Date: 04/25/2023       [facility] [address] [place]. [place] [address] [phone]      Electroencephalogram Procedure Note Mr. Tylek Bolger Date of Birth:  Nov 22, 1950 Medical Record Number:  [id number] Indications: Diagnostic Date of Procedure 04/19/2023 Medications: gabapentin (Neurontin) and topiramate (Topamax) Clinical history : 73 year old patient being evaluated for seizures Technical Description This study was performed using 17 channel digital electroencephalographic recording equipment. International 10-20 electrode placement was used. The record was obtained with the patient awake, drowsy, and asleep.  The record is of fair technical quality for purposes of interpretation. Activation Procedures:  hyperventilation . EEG Description  Awake: Alpha Activity: The waking state record contains a well-defined bi-occipital alpha rhythm of 10 to 11 Hz of low amplitude  Hz. Reactivity is uncertain. No paroxsymal activity, spikes, or sharp waves are noted.  The record has moderate motion artifacts which make the study suboptimal Technical component of study is suboptimal due to excessive motion artifacts with patient still coughing EKG tracing shows sinus rhythm   Length of this recording is 25 minutes and 42 seconds Sleep: With drowsiness, there is attenuation of the background alpha activity. As the patient enters into light sleep,  vertex waves and symmetrical spindles are noted. K complexes are noted in sleep. Transition to the waking state is unremarkable. Result of Activation Procedures: Hyperventilation: N/A. Photo Stimulation: Photic stimulation failed to activated the recording. Summary Normal electroencephalogram, awake, asleep and with activation procedures. There are no focal lateralizing or epileptiform features.  Study is suboptimal due to excessive motion artifacts.    LAB RESULTS last 90 days Recent Results (from the past 2160 hours)  Dementia Panel     Status: Abnormal   Collection Time: 03/14/23  9:09 AM  Result Value Ref Range   Vitamin B-12 850 232 - 1,245 pg/mL   Homocysteine 7.2 0.0 - 19.2 umol/L   TSH 5.580 (H) 0.450 - 4.500 uIU/mL   RPR Ser Ql Non Reactive Non Reactive  Nitric oxide     Status: None   Collection Time: 04/25/23  3:49 PM  Result Value Ref Range   Nitric Oxide 10          has a past medical history of Neuropathy.   reports that he has never smoked. He has never used smokeless tobacco.  No past surgical history on file.  Allergies  Allergen Reactions   Shrimp [Shellfish Allergy]     Immunization History  Administered Date(s) Administered   PFIZER(Purple Top)SARS-COV-2 Vaccination 05/18/2019, 06/09/2019    Family History  Problem Relation Age of Onset   Diabetes Mother    Cancer Mother    Heart disease Mother    Cancer Father    Hyperlipidemia Father    Neuropathy Paternal Uncle    Migraines Neg Hx      Current Outpatient Medications:    allopurinol (ZYLOPRIM) 300 MG tablet, allopurinol 300 mg tablet, Disp: , Rfl:    azithromycin (ZITHROMAX) 250 MG tablet, Take 2 tablets (500 mg total) by mouth daily for 6 doses. Care take 100 mg [2 tablets] today and then 1 tablet daily for the next 4 days, Disp: 6 tablet, Rfl: 0   cholecalciferol (VITAMIN D3) 25 MCG (1000 UNIT) tablet, Take 5,000 Units by mouth daily., Disp: , Rfl:    Cyanocobalamin (VITAMIN B12) 1000 MCG  TBCR, 1 tablet Orally Once a day, Disp: , Rfl:    ezetimibe (ZETIA) 10 MG tablet, Take 10 mg by mouth daily., Disp: , Rfl:    FLUoxetine (PROZAC) 10 MG tablet, Take 20 mg by mouth 2 (two) times daily., Disp: , Rfl:    gabapentin (NEURONTIN) 300 MG capsule, Take 2-3 capsules (600-900 mg total) by mouth 3 (three) times daily. Pt will take 600 mg in am, 600 mg at lunch and 900 mg in evening X 1 week, then increase to 600 mg in am, 900 mg in afternoon and 900 mg in evening X 1 week and if tolerated increase to 900 mg TID., Disp: 270 capsule, Rfl: 5   indomethacin (INDOCIN) 50 MG capsule, daily as needed., Disp: , Rfl:    L-Methylfolate-B12-B6-B2 (CEREFOLIN) 09-02-48-5  MG TABS, Take 1 capsule by mouth daily., Disp: 90 tablet, Rfl: 3   MELATONIN PO, Take 5 mg by mouth., Disp: , Rfl:    olmesartan (BENICAR) 20 MG tablet, Take 10 mg by mouth daily., Disp: , Rfl:    pravastatin (PRAVACHOL) 80 MG tablet, Take 80 mg by mouth at bedtime., Disp: , Rfl:    predniSONE (DELTASONE) 10 MG tablet, Take 1 tablet (10 mg total) by mouth daily with breakfast for 11 doses. Please take prednisone 40 mg x1 day, then 30 mg x1 day, then 20 mg x1 day, then 10 mg x1 day, and then 5 mg x1 day and stop, Disp: 11 tablet, Rfl: 0   pyridoxine (B-6) 100 MG tablet, 1 tablet Orally Once a day, Disp: , Rfl:    topiramate (TOPAMAX) 25 MG tablet, Take 1 tablet (25 mg total) by mouth 2 (two) times daily. Must be seen for further refills. Call [phone]., Disp: 60 tablet, Rfl: 0      Objective:   Vitals:   04/25/23 1509 04/25/23 1511  BP: 106/68 106/68  Pulse: 99   SpO2: 96%   Weight: 250 lb 6.4 oz (113.6 kg)   Height: 6\' 4"  (1.93 m)     Estimated body mass index is 30.48 kg/m as calculated from the following:   Height as of this encounter: 6\' 4"  (1.93 m).   Weight as of this encounter: 250 lb 6.4 oz (113.6 kg).  @WEIGHTCHANGE @  [employer]   04/25/23 1509  Weight: 250 lb 6.4 oz (113.6 kg)     Physical  Exam   General: No distress. TALL O2 at rest: no Cane present: YES Sitting in wheel chair: no Frail: no Obese: no Neuro: Alert and Oriented x 3. GCS 15. Speech normal Psych: Pleasant Resp:  Barrel Chest - no.  Wheeze - no, Crackles - no, No overt respiratory distress CVS: Normal heart sounds. Murmurs - no Ext: Stigmata of Connective Tissue Disease - no. HAS CHRONIC EDEMA HEENT: Normal upper airway. PEERL +. No post nasal drip        Assessment:       ICD-10-CM   1. Subacute cough  R05.2 Nitric oxide    B pertussis IgG/IgM Ab    CBC w/Diff    IgE    IgE    CBC w/Diff    B pertussis IgG/IgM Ab    2. Sinus congestion  R09.81 Nitric oxide    B pertussis IgG/IgM Ab    CBC w/Diff    IgE    IgE    CBC w/Diff    B pertussis IgG/IgM Ab         Plan:     Patient Instructions     ICD-10-CM   1. Subacute cough  R05.2     2. Sinus congestion  R09.81       I I think you are having some postviral bronchitis Unclear if there is a complicating factor such as asthma  -the egg exhaled nitric oxide test was normal at 10 ppb.  Plan   -Get blood work for CBC with differential blood IgE and pertussis antibodies - Take Z-Pak -Take prednisone short course  Follow-up - 6 weeks with nurse practitioner; can be video visit  -If not better then we will get pulmonary function test and CT chest at follow-up    FOLLOWUP Return in about 6 weeks (around 06/06/2023) for with any of the APPS, VIDEO VISIT.    SIGNATURE    Dr.  Kalman Shan, M.D., F.C.C.P,  Pulmonary and Critical Care Medicine Staff Physician, [facility] Director - Interstitial Lung Disease  Program  Pulmonary Fibrosis [facility] Network at [facility] [place], [place], [address]  Pager: [phone], If no answer or between  15:00h - 7:00h: call [phone] Telephone: [phone]  5:17 PM 04/25/2023

## 2023-04-25 ENCOUNTER — Ambulatory Visit: Payer: Medicare Other | Admitting: Internal Medicine

## 2023-04-25 VITALS — BP 106/68 | HR 99 | Ht 76.0 in | Wt 250.4 lb

## 2023-04-25 DIAGNOSIS — R0981 Nasal congestion: Secondary | ICD-10-CM | POA: Diagnosis not present

## 2023-04-25 DIAGNOSIS — R052 Subacute cough: Secondary | ICD-10-CM | POA: Diagnosis not present

## 2023-04-25 LAB — NITRIC OXIDE: Nitric Oxide: 10

## 2023-04-25 MED ORDER — PREDNISONE 10 MG PO TABS: 10.0000 mg | ORAL_TABLET | Freq: Every day | ORAL | 0 refills | Status: AC

## 2023-04-25 MED ORDER — AZITHROMYCIN 250 MG PO TABS: 500.0000 mg | ORAL_TABLET | Freq: Every day | ORAL | 0 refills | Status: AC

## 2023-04-25 NOTE — Patient Instructions (Addendum)
ICD-10-CM   1. Subacute cough  R05.2     2. Sinus congestion  R09.81       I I think you are having some postviral bronchitis Unclear if there is a complicating factor such as asthma  -the egg exhaled nitric oxide test was normal at 10 ppb.  Plan   -Get blood work for CBC with differential blood IgE and pertussis antibodies - Take Z-Pak -Take prednisone short course  Follow-up - 6 weeks with nurse practitioner; can be video visit  -If not better then we will get pulmonary function test and CT chest at follow-up

## 2023-04-26 ENCOUNTER — Encounter: Payer: Self-pay | Admitting: Internal Medicine

## 2023-04-26 LAB — CBC WITH DIFFERENTIAL/PLATELET
Basophils Absolute: 0 10*3/uL (ref 0.0–0.1)
Basophils Relative: 0.3 % (ref 0.0–3.0)
Eosinophils Absolute: 0.1 10*3/uL (ref 0.0–0.7)
Eosinophils Relative: 1.6 % (ref 0.0–5.0)
HCT: 35.6 % — ABNORMAL LOW (ref 39.0–52.0)
Hemoglobin: 12 g/dL — ABNORMAL LOW (ref 13.0–17.0)
Lymphocytes Relative: 9.5 % — ABNORMAL LOW (ref 12.0–46.0)
Lymphs Abs: 0.6 10*3/uL — ABNORMAL LOW (ref 0.7–4.0)
MCHC: 33.8 g/dL (ref 30.0–36.0)
MCV: 97.4 fL (ref 78.0–100.0)
Monocytes Absolute: 0.9 10*3/uL (ref 0.1–1.0)
Monocytes Relative: 13.8 % — ABNORMAL HIGH (ref 3.0–12.0)
Neutro Abs: 5 10*3/uL (ref 1.4–7.7)
Neutrophils Relative %: 74.8 % (ref 43.0–77.0)
Platelets: 335 10*3/uL (ref 150.0–400.0)
RBC: 3.65 Mil/uL — ABNORMAL LOW (ref 4.22–5.81)
RDW: 13.9 % (ref 11.5–15.5)
WBC: 6.7 10*3/uL (ref 4.0–10.5)

## 2023-04-27 NOTE — Telephone Encounter (Addendum)
Spoke to pt gave EEG and MRI results pt expressed understanding and thanked me for calling

## 2023-04-28 LAB — IGE: IgE (Immunoglobulin E), Serum: 7 [IU]/mL (ref 6–495)

## 2023-04-28 LAB — B PERTUSSIS IGG/IGM AB
B pertussis IgG Ab: 1.46 {index} — ABNORMAL HIGH (ref 0.00–0.94)
B pertussis IgM Ab, Quant: <1 {index} (ref 0.0–0.9)

## 2023-05-03 NOTE — Telephone Encounter (Signed)
Dr. Marchelle Gearing, Please see patient comment.  This is just an [place].  Thank you.

## 2023-05-03 NOTE — Progress Notes (Signed)
Kindly inform the patient that EEG of brainwave study was normal.  No evidence of seizure activity.  The study was slightly suboptimal due to excessive movement artifacts

## 2023-05-04 ENCOUNTER — Telehealth: Payer: Self-pay | Admitting: *Deleted

## 2023-05-04 ENCOUNTER — Telehealth: Payer: Self-pay | Admitting: Internal Medicine

## 2023-05-04 DIAGNOSIS — R052 Subacute cough: Secondary | ICD-10-CM

## 2023-05-04 NOTE — Telephone Encounter (Signed)
-----   Message from David Mcgrath sent at 05/03/2023  6:49 PM EST ----- Kindly inform the patient that EEG of brainwave study was normal.  No evidence of seizure activity.  The study was slightly suboptimal due to excessive movement artifacts

## 2023-05-04 NOTE — Telephone Encounter (Signed)
Spoke to pt gave EEG results Pt expressed understanding and thanked me for calling

## 2023-05-12 NOTE — Telephone Encounter (Signed)
 Pt sent mychart message checking to see if there was any update on the lab results and also if the imaging from the discs that were dropped off had been read/reviewed.  Dr. Bertrum Brodie, please advise.

## 2023-05-15 NOTE — Addendum Note (Signed)
 Addended by: Santana Cue A on: 05/15/2023 08:14 AM   Modules accepted: Orders

## 2023-05-18 NOTE — Telephone Encounter (Signed)
Not had chance to reviwe cxr bc . BUt a) pertussis antibiodies do not indicate recent infetio; b) is cough better after z pak and predniopsne. His blood work IgE and EOS normal thugh he ha mild anemia  Plan  - if cough not better needs HRCT - wil aim to reviwe xray week of 05/22/23 (is in cabinet in [place] [facility]) - wil need repeat cbc in a month or so to see if anemia better

## 2023-05-19 NOTE — Telephone Encounter (Signed)
LMTCB x 1

## 2023-05-19 NOTE — Telephone Encounter (Signed)
Order is in the WQ we will schedule

## 2023-05-19 NOTE — Telephone Encounter (Signed)
CT order has been placed.   Please schedule CT.

## 2023-05-19 NOTE — Telephone Encounter (Signed)
Patient called back and states cough is not better, would like CT scheduled. Patient states he is out of town next week. Patient is okay to repeat labwork. Please advise when xray is reviewed

## 2023-06-02 ENCOUNTER — Ambulatory Visit (HOSPITAL_COMMUNITY): Payer: Medicare Other

## 2023-06-04 ENCOUNTER — Emergency Department (HOSPITAL_COMMUNITY)

## 2023-06-04 ENCOUNTER — Encounter (HOSPITAL_COMMUNITY): Payer: Self-pay

## 2023-06-04 ENCOUNTER — Inpatient Hospital Stay (HOSPITAL_COMMUNITY)
Admission: EM | Admit: 2023-06-04 | Discharge: 2023-06-15 | DRG: 177 | Disposition: A | Discharge status: Skilled Nursing Facility | Attending: Family Medicine | Admitting: Family Medicine

## 2023-06-04 ENCOUNTER — Telehealth: Payer: Self-pay | Admitting: Internal Medicine

## 2023-06-04 DIAGNOSIS — J690 Pneumonitis due to inhalation of food and vomit: Secondary | ICD-10-CM | POA: Diagnosis not present

## 2023-06-04 DIAGNOSIS — R918 Other nonspecific abnormal finding of lung field: Secondary | ICD-10-CM | POA: Diagnosis not present

## 2023-06-04 DIAGNOSIS — M899 Disorder of bone, unspecified: Secondary | ICD-10-CM | POA: Diagnosis not present

## 2023-06-04 DIAGNOSIS — A419 Sepsis, unspecified organism: Secondary | ICD-10-CM | POA: Diagnosis not present

## 2023-06-04 DIAGNOSIS — F419 Anxiety disorder, unspecified: Secondary | ICD-10-CM | POA: Diagnosis present

## 2023-06-04 DIAGNOSIS — J1000 Influenza due to other identified influenza virus with unspecified type of pneumonia: Secondary | ICD-10-CM | POA: Diagnosis present

## 2023-06-04 DIAGNOSIS — Z85828 Personal history of other malignant neoplasm of skin: Secondary | ICD-10-CM | POA: Diagnosis not present

## 2023-06-04 DIAGNOSIS — I10 Essential (primary) hypertension: Secondary | ICD-10-CM | POA: Diagnosis not present

## 2023-06-04 DIAGNOSIS — K567 Ileus, unspecified: Secondary | ICD-10-CM

## 2023-06-04 DIAGNOSIS — J189 Pneumonia, unspecified organism: Secondary | ICD-10-CM

## 2023-06-04 DIAGNOSIS — K760 Fatty (change of) liver, not elsewhere classified: Secondary | ICD-10-CM | POA: Diagnosis present

## 2023-06-04 DIAGNOSIS — I2510 Atherosclerotic heart disease of native coronary artery without angina pectoris: Secondary | ICD-10-CM | POA: Diagnosis not present

## 2023-06-04 DIAGNOSIS — F32A Depression, unspecified: Secondary | ICD-10-CM | POA: Diagnosis not present

## 2023-06-04 DIAGNOSIS — E785 Hyperlipidemia, unspecified: Secondary | ICD-10-CM | POA: Diagnosis not present

## 2023-06-04 DIAGNOSIS — G4733 Obstructive sleep apnea (adult) (pediatric): Secondary | ICD-10-CM | POA: Diagnosis present

## 2023-06-04 DIAGNOSIS — Z83438 Family history of other disorder of lipoprotein metabolism and other lipidemia: Secondary | ICD-10-CM

## 2023-06-04 DIAGNOSIS — R059 Cough, unspecified: Secondary | ICD-10-CM | POA: Diagnosis not present

## 2023-06-04 DIAGNOSIS — J111 Influenza due to unidentified influenza virus with other respiratory manifestations: Secondary | ICD-10-CM | POA: Insufficient documentation

## 2023-06-04 DIAGNOSIS — R531 Weakness: Secondary | ICD-10-CM | POA: Diagnosis not present

## 2023-06-04 DIAGNOSIS — J42 Unspecified chronic bronchitis: Secondary | ICD-10-CM | POA: Diagnosis not present

## 2023-06-04 DIAGNOSIS — Z91013 Allergy to seafood: Secondary | ICD-10-CM

## 2023-06-04 DIAGNOSIS — R932 Abnormal findings on diagnostic imaging of liver and biliary tract: Secondary | ICD-10-CM | POA: Diagnosis not present

## 2023-06-04 DIAGNOSIS — R6520 Severe sepsis without septic shock: Secondary | ICD-10-CM | POA: Diagnosis not present

## 2023-06-04 DIAGNOSIS — J09X1 Influenza due to identified novel influenza A virus with pneumonia: Secondary | ICD-10-CM | POA: Diagnosis not present

## 2023-06-04 DIAGNOSIS — G608 Other hereditary and idiopathic neuropathies: Secondary | ICD-10-CM | POA: Diagnosis not present

## 2023-06-04 DIAGNOSIS — R197 Diarrhea, unspecified: Secondary | ICD-10-CM

## 2023-06-04 DIAGNOSIS — Z1152 Encounter for screening for COVID-19: Secondary | ICD-10-CM

## 2023-06-04 DIAGNOSIS — R000 Tachycardia, unspecified: Secondary | ICD-10-CM | POA: Diagnosis not present

## 2023-06-04 DIAGNOSIS — R7989 Other specified abnormal findings of blood chemistry: Secondary | ICD-10-CM | POA: Diagnosis not present

## 2023-06-04 DIAGNOSIS — R5381 Other malaise: Secondary | ICD-10-CM | POA: Diagnosis present

## 2023-06-04 DIAGNOSIS — M109 Gout, unspecified: Secondary | ICD-10-CM | POA: Diagnosis not present

## 2023-06-04 DIAGNOSIS — J101 Influenza due to other identified influenza virus with other respiratory manifestations: Secondary | ICD-10-CM | POA: Diagnosis not present

## 2023-06-04 DIAGNOSIS — J9601 Acute respiratory failure with hypoxia: Secondary | ICD-10-CM

## 2023-06-04 DIAGNOSIS — Z8249 Family history of ischemic heart disease and other diseases of the circulatory system: Secondary | ICD-10-CM

## 2023-06-04 DIAGNOSIS — R2689 Other abnormalities of gait and mobility: Secondary | ICD-10-CM | POA: Diagnosis not present

## 2023-06-04 DIAGNOSIS — N179 Acute kidney failure, unspecified: Secondary | ICD-10-CM | POA: Insufficient documentation

## 2023-06-04 DIAGNOSIS — R1314 Dysphagia, pharyngoesophageal phase: Secondary | ICD-10-CM | POA: Diagnosis not present

## 2023-06-04 DIAGNOSIS — M1A9XX0 Chronic gout, unspecified, without tophus (tophi): Secondary | ICD-10-CM | POA: Diagnosis present

## 2023-06-04 DIAGNOSIS — R0689 Other abnormalities of breathing: Secondary | ICD-10-CM | POA: Diagnosis not present

## 2023-06-04 DIAGNOSIS — S2231XA Fracture of one rib, right side, initial encounter for closed fracture: Secondary | ICD-10-CM | POA: Diagnosis not present

## 2023-06-04 DIAGNOSIS — G629 Polyneuropathy, unspecified: Secondary | ICD-10-CM | POA: Diagnosis present

## 2023-06-04 DIAGNOSIS — R2681 Unsteadiness on feet: Secondary | ICD-10-CM | POA: Diagnosis not present

## 2023-06-04 DIAGNOSIS — G5601 Carpal tunnel syndrome, right upper limb: Secondary | ICD-10-CM | POA: Diagnosis not present

## 2023-06-04 DIAGNOSIS — G609 Hereditary and idiopathic neuropathy, unspecified: Secondary | ICD-10-CM | POA: Diagnosis present

## 2023-06-04 DIAGNOSIS — M6281 Muscle weakness (generalized): Secondary | ICD-10-CM | POA: Diagnosis not present

## 2023-06-04 DIAGNOSIS — K8689 Other specified diseases of pancreas: Secondary | ICD-10-CM | POA: Diagnosis not present

## 2023-06-04 DIAGNOSIS — Z833 Family history of diabetes mellitus: Secondary | ICD-10-CM | POA: Diagnosis not present

## 2023-06-04 DIAGNOSIS — J168 Pneumonia due to other specified infectious organisms: Secondary | ICD-10-CM | POA: Diagnosis not present

## 2023-06-04 DIAGNOSIS — Z79899 Other long term (current) drug therapy: Secondary | ICD-10-CM

## 2023-06-04 DIAGNOSIS — R109 Unspecified abdominal pain: Secondary | ICD-10-CM | POA: Diagnosis not present

## 2023-06-04 DIAGNOSIS — J90 Pleural effusion, not elsewhere classified: Secondary | ICD-10-CM | POA: Diagnosis not present

## 2023-06-04 DIAGNOSIS — K219 Gastro-esophageal reflux disease without esophagitis: Secondary | ICD-10-CM | POA: Diagnosis not present

## 2023-06-04 DIAGNOSIS — Z79890 Hormone replacement therapy (postmenopausal): Secondary | ICD-10-CM

## 2023-06-04 DIAGNOSIS — R1031 Right lower quadrant pain: Secondary | ICD-10-CM | POA: Diagnosis not present

## 2023-06-04 DIAGNOSIS — Z7401 Bed confinement status: Secondary | ICD-10-CM | POA: Diagnosis not present

## 2023-06-04 DIAGNOSIS — R112 Nausea with vomiting, unspecified: Secondary | ICD-10-CM | POA: Diagnosis not present

## 2023-06-04 HISTORY — DX: Hyperlipidemia, unspecified: E78.5

## 2023-06-04 HISTORY — DX: Diarrhea, unspecified: R19.7

## 2023-06-04 HISTORY — DX: Acute respiratory failure with hypoxia: J96.01

## 2023-06-04 HISTORY — DX: Essential (primary) hypertension: I10

## 2023-06-04 HISTORY — DX: Fatty (change of) liver, not elsewhere classified: K76.0

## 2023-06-04 HISTORY — DX: Pneumonia, unspecified organism: J18.9

## 2023-06-04 HISTORY — DX: Acute kidney failure, unspecified: N17.9

## 2023-06-04 HISTORY — DX: Ileus, unspecified: K56.7

## 2023-06-04 HISTORY — DX: Influenza due to unidentified influenza virus with other respiratory manifestations: J11.1

## 2023-06-04 LAB — COMPREHENSIVE METABOLIC PANEL
ALT: 49 U/L — ABNORMAL HIGH (ref 0–44)
AST: 96 U/L — ABNORMAL HIGH (ref 15–41)
Albumin: 3.3 g/dL — ABNORMAL LOW (ref 3.5–5.0)
Alkaline Phosphatase: 52 U/L (ref 38–126)
Anion gap: 11 (ref 5–15)
BUN: 38 mg/dL — ABNORMAL HIGH (ref 8–23)
CO2: 21 mmol/L — ABNORMAL LOW (ref 22–32)
Calcium: 8.2 mg/dL — ABNORMAL LOW (ref 8.9–10.3)
Chloride: 100 mmol/L (ref 98–111)
Creatinine, Ser: 1.95 mg/dL — ABNORMAL HIGH (ref 0.61–1.24)
GFR, Estimated: 36 mL/min — ABNORMAL LOW (ref >60)
Glucose, Bld: 112 mg/dL — ABNORMAL HIGH (ref 70–99)
Potassium: 4.5 mmol/L (ref 3.5–5.1)
Sodium: 132 mmol/L — ABNORMAL LOW (ref 135–145)
Total Bilirubin: 0.6 mg/dL (ref 0.0–1.2)
Total Protein: 6.3 g/dL — ABNORMAL LOW (ref 6.5–8.1)

## 2023-06-04 LAB — I-STAT CG4 LACTIC ACID, ED: Lactic Acid, Venous: 1.5 mmol/L (ref 0.5–1.9)

## 2023-06-04 LAB — CBC WITH DIFFERENTIAL/PLATELET
Abs Immature Granulocytes: 0.02 10*3/uL (ref 0.00–0.07)
Basophils Absolute: 0 10*3/uL (ref 0.0–0.1)
Basophils Relative: 0 %
Eosinophils Absolute: 0 10*3/uL (ref 0.0–0.5)
Eosinophils Relative: 0 %
HCT: 38 % — ABNORMAL LOW (ref 39.0–52.0)
Hemoglobin: 12.5 g/dL — ABNORMAL LOW (ref 13.0–17.0)
Immature Granulocytes: 0 %
Lymphocytes Relative: 7 %
Lymphs Abs: 0.5 10*3/uL — ABNORMAL LOW (ref 0.7–4.0)
MCH: 31.6 pg (ref 26.0–34.0)
MCHC: 32.9 g/dL (ref 30.0–36.0)
MCV: 96.2 fL (ref 80.0–100.0)
Monocytes Absolute: 0.4 10*3/uL (ref 0.1–1.0)
Monocytes Relative: 6 %
Neutro Abs: 6.2 10*3/uL (ref 1.7–7.7)
Neutrophils Relative %: 87 %
Platelets: 224 10*3/uL (ref 150–400)
RBC: 3.95 MIL/uL — ABNORMAL LOW (ref 4.22–5.81)
RDW: 13.7 % (ref 11.5–15.5)
WBC: 7.1 10*3/uL (ref 4.0–10.5)
nRBC: 0 % (ref 0.0–0.2)

## 2023-06-04 LAB — RESP PANEL BY RT-PCR (RSV, FLU A&B, COVID)  RVPGX2
Influenza A by PCR: POSITIVE — AB
Influenza B by PCR: NEGATIVE
Resp Syncytial Virus by PCR: NEGATIVE
SARS Coronavirus 2 by RT PCR: NEGATIVE

## 2023-06-04 LAB — MAGNESIUM: Magnesium: 2 mg/dL (ref 1.7–2.4)

## 2023-06-04 LAB — PROCALCITONIN: Procalcitonin: 0.16 ng/mL

## 2023-06-04 MED ORDER — ENOXAPARIN SODIUM 40 MG/0.4ML IJ SOSY
40.0000 mg | PREFILLED_SYRINGE | Freq: Every day | INTRAMUSCULAR | Status: DC
  Administered 2023-06-04 – 2023-06-14 (×11): 40 mg via SUBCUTANEOUS
  Filled 2023-06-04 (×11): qty 0.4

## 2023-06-04 MED ORDER — DOCUSATE SODIUM 100 MG PO CAPS: 100.0000 mg | ORAL_CAPSULE | Freq: Two times a day (BID) | ORAL | Status: DC | PRN

## 2023-06-04 MED ORDER — MELATONIN 5 MG PO TABS
5.0000 mg | ORAL_TABLET | Freq: Every evening | ORAL | Status: DC | PRN
  Administered 2023-06-04 – 2023-06-14 (×10): 5 mg via ORAL
  Filled 2023-06-04 (×10): qty 1

## 2023-06-04 MED ORDER — HYDRALAZINE HCL 20 MG/ML IJ SOLN: 5.0000 mg | Freq: Four times a day (QID) | INTRAMUSCULAR | Status: DC | PRN

## 2023-06-04 MED ORDER — IOHEXOL 350 MG/ML SOLN
80.0000 mL | Freq: Once | INTRAVENOUS | Status: AC | PRN
  Administered 2023-06-04: 80 mL via INTRAVENOUS

## 2023-06-04 MED ORDER — PANTOPRAZOLE SODIUM 40 MG PO TBEC
40.0000 mg | DELAYED_RELEASE_TABLET | Freq: Every day | ORAL | Status: DC
  Administered 2023-06-04 – 2023-06-15 (×12): 40 mg via ORAL
  Filled 2023-06-04 (×12): qty 1

## 2023-06-04 MED ORDER — GABAPENTIN 300 MG PO CAPS
600.0000 mg | ORAL_CAPSULE | Freq: Two times a day (BID) | ORAL | Status: DC
Start: 2023-06-04 — End: 2023-06-15
  Administered 2023-06-04 – 2023-06-15 (×23): 600 mg via ORAL
  Filled 2023-06-04 (×23): qty 2

## 2023-06-04 MED ORDER — SODIUM CHLORIDE 0.9 % IV SOLN
3.0000 g | Freq: Four times a day (QID) | INTRAVENOUS | Status: DC
  Administered 2023-06-04 – 2023-06-07 (×10): 3 g via INTRAVENOUS
  Filled 2023-06-04 (×11): qty 8

## 2023-06-04 MED ORDER — ONDANSETRON HCL 4 MG/2ML IJ SOLN
4.0000 mg | Freq: Four times a day (QID) | INTRAMUSCULAR | Status: DC | PRN
  Administered 2023-06-04 – 2023-06-09 (×2): 4 mg via INTRAVENOUS
  Filled 2023-06-04 (×4): qty 2

## 2023-06-04 MED ORDER — BENZONATATE 100 MG PO CAPS
200.0000 mg | ORAL_CAPSULE | Freq: Three times a day (TID) | ORAL | Status: DC | PRN
Start: 2023-06-04 — End: 2023-06-15
  Administered 2023-06-06 – 2023-06-14 (×18): 200 mg via ORAL
  Filled 2023-06-04 (×18): qty 2

## 2023-06-04 MED ORDER — SODIUM CHLORIDE 0.9 % IV SOLN
3.0000 g | Freq: Once | INTRAVENOUS | Status: AC
  Administered 2023-06-04: 3 g via INTRAVENOUS
  Filled 2023-06-04: qty 8

## 2023-06-04 MED ORDER — IOHEXOL 300 MG/ML  SOLN: 80.0000 mL | Freq: Once | INTRAMUSCULAR | Status: DC | PRN

## 2023-06-04 MED ORDER — GUAIFENESIN ER 600 MG PO TB12
600.0000 mg | ORAL_TABLET | Freq: Two times a day (BID) | ORAL | Status: DC
  Administered 2023-06-04 – 2023-06-15 (×23): 600 mg via ORAL
  Filled 2023-06-04 (×23): qty 1

## 2023-06-04 MED ORDER — ACETAMINOPHEN 325 MG PO TABS
650.0000 mg | ORAL_TABLET | Freq: Four times a day (QID) | ORAL | Status: DC | PRN
  Administered 2023-06-04 – 2023-06-15 (×16): 650 mg via ORAL
  Filled 2023-06-04 (×16): qty 2

## 2023-06-04 MED ORDER — IPRATROPIUM-ALBUTEROL 0.5-2.5 (3) MG/3ML IN SOLN
3.0000 mL | Freq: Once | RESPIRATORY_TRACT | Status: AC
  Administered 2023-06-04: 3 mL via RESPIRATORY_TRACT
  Filled 2023-06-04: qty 3

## 2023-06-04 MED ORDER — FLUOXETINE HCL 20 MG PO CAPS
20.0000 mg | ORAL_CAPSULE | Freq: Two times a day (BID) | ORAL | Status: DC
  Administered 2023-06-04 – 2023-06-08 (×8): 20 mg via ORAL
  Filled 2023-06-04 (×8): qty 1

## 2023-06-04 MED ORDER — SODIUM CHLORIDE 0.9 % IV SOLN
500.0000 mg | Freq: Once | INTRAVENOUS | Status: AC
  Administered 2023-06-04: 500 mg via INTRAVENOUS
  Filled 2023-06-04: qty 5

## 2023-06-04 MED ORDER — ACETAMINOPHEN 650 MG RE SUPP: 650.0000 mg | Freq: Four times a day (QID) | RECTAL | Status: DC | PRN

## 2023-06-04 MED ORDER — SODIUM CHLORIDE 0.9 % IV BOLUS
1000.0000 mL | Freq: Once | INTRAVENOUS | Status: AC
  Administered 2023-06-04: 1000 mL via INTRAVENOUS

## 2023-06-04 MED ORDER — SODIUM CHLORIDE 0.9 % IV SOLN: INTRAVENOUS | Status: DC

## 2023-06-04 MED ORDER — SODIUM CHLORIDE 0.9 % IV SOLN
500.0000 mg | Freq: Every day | INTRAVENOUS | Status: DC
  Administered 2023-06-05 – 2023-06-06 (×2): 500 mg via INTRAVENOUS
  Filled 2023-06-04 (×2): qty 5

## 2023-06-04 MED ORDER — ALBUTEROL SULFATE (2.5 MG/3ML) 0.083% IN NEBU: 2.5000 mg | INHALATION_SOLUTION | RESPIRATORY_TRACT | Status: DC | PRN

## 2023-06-04 MED ORDER — ONDANSETRON HCL 4 MG PO TABS: 4.0000 mg | ORAL_TABLET | Freq: Four times a day (QID) | ORAL | Status: DC | PRN

## 2023-06-04 NOTE — ED Provider Notes (Signed)
 [facility] AT [facility] Provider Note   CSN: [id number] Arrival date & time: 06/04/23  2956     History  Chief Complaint  Patient presents with   Abdominal Pain    RLQ     David Mcgrath is a 73 y.o. male.   Abdominal Pain Patient is transferred chest abdominal pain.  Has had a chronic cough.  Has been seen by pulmonary and thought it was due to previous viral syndrome.  Now more weakness.  Still having cough.  Mildly hypoxic on arrival.  Does have some right upper quadrant abdominal pain also.  Reported has had some nausea vomiting diarrhea also.    Past Medical History:  Diagnosis Date   Neuropathy     Home Medications Prior to Admission medications   Medication Sig Start Date End Date Taking? Authorizing Provider  allopurinol (ZYLOPRIM) 300 MG tablet allopurinol 300 mg tablet    [provider]  cholecalciferol (VITAMIN D3) 25 MCG (1000 UNIT) tablet Take 5,000 Units by mouth daily.    [provider]  Cyanocobalamin (VITAMIN B12) 1000 MCG TBCR 1 tablet Orally Once a day    [provider]  ezetimibe (ZETIA) 10 MG tablet Take 10 mg by mouth daily. 06/01/19   [provider]  FLUoxetine (PROZAC) 10 MG tablet Take 20 mg by mouth 2 (two) times daily.    [provider]  gabapentin (NEURONTIN) 300 MG capsule Take 2-3 capsules (600-900 mg total) by mouth 3 (three) times daily. Pt will take 600 mg in am, 600 mg at lunch and 900 mg in evening X 1 week, then increase to 600 mg in am, 900 mg in afternoon and 900 mg in evening X 1 week and if tolerated increase to 900 mg TID. 03/23/23   Micki Riley, MD  indomethacin (INDOCIN) 50 MG capsule daily as needed.    [provider]  L-Methylfolate-B12-B6-B2 (CEREFOLIN) 09-02-48-5 MG TABS Take 1 capsule by mouth daily. 03/14/23   Micki Riley, MD  MELATONIN PO Take 5 mg by mouth.    [provider]  olmesartan (BENICAR) 20 MG tablet Take  10 mg by mouth daily.    [provider]  pravastatin (PRAVACHOL) 80 MG tablet Take 80 mg by mouth at bedtime.    [provider]  pyridoxine (B-6) 100 MG tablet 1 tablet Orally Once a day    [provider]  topiramate (TOPAMAX) 25 MG tablet Take 1 tablet (25 mg total) by mouth 2 (two) times daily. Must be seen for further refills. Call [phone]. 04/06/20   Micki Riley, MD      Allergies    Shrimp [shellfish allergy]    Review of Systems   Review of Systems  Gastrointestinal:  Positive for abdominal pain.    Physical Exam Updated Vital Signs BP (!) 141/56 (BP Location: Left Arm)   Pulse (!) 40   Temp 98.8 F (37.1 C) (Oral)   Resp (!) 24   SpO2 (!) 89%  Physical Exam Vitals and nursing note reviewed.  Cardiovascular:     Rate and Rhythm: Regular rhythm.  Pulmonary:     Comments: Tachypnea. Abdominal:     Tenderness: There is abdominal tenderness.     Comments: Right upper quadrant tenderness without rebound or guarding.  No hernia palpated.  Skin:    Capillary Refill: Capillary refill takes less than 2 seconds.  Neurological:     Mental Status: He is alert.  ED Results / Procedures / Treatments   Labs (all labs ordered are listed, but only abnormal results are displayed) Labs Reviewed  RESP PANEL BY RT-PCR (RSV, FLU A&B, COVID)  RVPGX2 - Abnormal; Notable for the following components:      Result Value   Influenza A by PCR POSITIVE (*)    All other components within normal limits  COMPREHENSIVE METABOLIC PANEL - Abnormal; Notable for the following components:   Sodium 132 (*)    CO2 21 (*)    Glucose, Bld 112 (*)    BUN 38 (*)    Creatinine, Ser 1.95 (*)    Calcium 8.2 (*)    Total Protein 6.3 (*)    Albumin 3.3 (*)    AST 96 (*)    ALT 49 (*)    GFR, Estimated 36 (*)    All other components within normal limits  CBC WITH DIFFERENTIAL/PLATELET - Abnormal; Notable for the following components:   RBC 3.95 (*)     Hemoglobin 12.5 (*)    HCT 38.0 (*)    Lymphs Abs 0.5 (*)    All other components within normal limits  CULTURE, BLOOD (ROUTINE X 2)  CULTURE, BLOOD (ROUTINE X 2)  PROCALCITONIN  STREP PNEUMONIAE URINARY ANTIGEN  LEGIONELLA PNEUMOPHILA SEROGP 1 UR AG  I-STAT CG4 LACTIC ACID, ED    EKG None  Radiology CT Angio Chest PE W and/or Wo Contrast Result Date: 06/04/2023 CLINICAL DATA:  Abdominal pain, emesis, diarrhea, right lower abdominal pain and guarding for 1 week, shortness of breath, PE suspected * Tracking Code: BO * EXAM: CT ANGIOGRAPHY CHEST CT ABDOMEN AND PELVIS WITH CONTRAST TECHNIQUE: Multidetector CT imaging of the chest was performed using the standard protocol during bolus administration of intravenous contrast. Multiplanar CT image reconstructions and MIPs were obtained to evaluate the vascular anatomy. Multidetector CT imaging of the abdomen and pelvis was performed using the standard protocol during bolus administration of intravenous contrast. RADIATION DOSE REDUCTION: This exam was performed according to the departmental dose-optimization program which includes automated exposure control, adjustment of the mA and/or kV according to patient size and/or use of iterative reconstruction technique. CONTRAST:  80mL OMNIPAQUE IOHEXOL 350 MG/ML SOLN COMPARISON:  None Available. FINDINGS: CT CHEST ANGIOGRAM FINDINGS Cardiovascular: Evaluation for pulmonary embolism is limited by breath motion artifact, particularly in the lung bases. Within this limitation, no evidence of pulmonary embolism through the proximal segmental pulmonary arterial level. Cardiomegaly. Three-vessel coronary artery calcifications. No pericardial effusion. Mediastinum/Nodes: Prominent mediastinal and hilar lymph nodes, likely reactive to airspace disease. Partially fluid-filled, thickened esophagus. Lungs/Pleura: Very extensive heterogeneous airspace opacity throughout the bilateral lung bases no pleural effusion or  pneumothorax. Musculoskeletal: No chest wall abnormality. Expansile, lytic lesion of the left fourth rib head measuring 3.2 x 2.2 cm (series 12, image 97). Review of the MIP images confirms the above findings. CT ABDOMEN PELVIS FINDINGS Hepatobiliary: No solid liver abnormality is seen. Hepatic steatosis. No gallstones, gallbladder wall thickening, or biliary dilatation. Pancreas: Unremarkable. No pancreatic ductal dilatation or surrounding inflammatory changes. Spleen: Normal in size without significant abnormality. Adrenals/Urinary Tract: Adrenal glands are unremarkable. Kidneys are normal, without renal calculi, solid lesion, or hydronephrosis. Bladder is unremarkable. Stomach/Bowel: Stomach is within normal limits. Appendix appears normal (series 2, image 67). Multiple loops of gas and fluid distended mid small bowel measuring up to 5.1 cm in caliber (series 2, image 50). There are relatively decompressed loops of small bowel in the distal right hemiabdomen without a clear transition point,  and there is gas and stool present in the colon to the rectum. Vascular/Lymphatic: Severe aortic atherosclerosis. No enlarged abdominal or pelvic lymph nodes. Reproductive: No mass or other significant abnormality. Other: No abdominal wall hernia or abnormality. No ascites. Musculoskeletal: No acute or significant osseous findings. Lytic osseous lesion of the right ilium measuring 3.9 x 2.0 cm (series 2, image 65) and of the left ilium measuring 2.7 x 2.2 cm (series 2, image 77). Possible lytic lesion versus Schmorl's node of the superior endplate of L5 (series 10, image 109) IMPRESSION: 1. Evaluation for pulmonary embolism is limited by breath motion artifact, particularly in the lung bases. Within this limitation, no evidence of pulmonary embolism through the proximal segmental pulmonary arterial level. 2. Very extensive heterogeneous airspace opacity throughout the bilateral lung bases, consistent with infection or  aspiration. The esophagus is thickened and partially fluid-filled, suggesting reflux and aspiration. 3. Prominent mediastinal and hilar lymph nodes, likely reactive to airspace disease. 4. Multiple loops of gas and fluid distended mid small bowel measuring up to 5.1 cm in caliber. Relatively decompressed loops of small bowel in the distal right hemiabdomen without a clear transition point, and there is gas and stool present in the colon to the rectum. Findings are most consistent with ileus, although partial or developing distal small bowel obstruction is not excluded. 5. 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 No candidate primary malignancy identified in the chest, abdomen, or pelvis. 6. Cardiomegaly and coronary artery disease. 7. Hepatic steatosis. Aortic Atherosclerosis (ICD10-I70.0). Electronically Signed   By: Jearld Lesch M.D.   On: 06/04/2023 12:03   CT ABDOMEN PELVIS W CONTRAST Result Date: 06/04/2023 CLINICAL DATA:  Abdominal pain, emesis, diarrhea, right lower abdominal pain and guarding for 1 week, shortness of breath, PE suspected * Tracking Code: BO * EXAM: CT ANGIOGRAPHY CHEST CT ABDOMEN AND PELVIS WITH CONTRAST TECHNIQUE: Multidetector CT imaging of the chest was performed using the standard protocol during bolus administration of intravenous contrast. Multiplanar CT image reconstructions and MIPs were obtained to evaluate the vascular anatomy. Multidetector CT imaging of the abdomen and pelvis was performed using the standard protocol during bolus administration of intravenous contrast. RADIATION DOSE REDUCTION: This exam was performed according to the departmental dose-optimization program which includes automated exposure control, adjustment of the mA and/or kV according to patient size and/or use of iterative reconstruction technique. CONTRAST:  80mL OMNIPAQUE IOHEXOL 350 MG/ML SOLN COMPARISON:  None Available.  FINDINGS: CT CHEST ANGIOGRAM FINDINGS Cardiovascular: Evaluation for pulmonary embolism is limited by breath motion artifact, particularly in the lung bases. Within this limitation, no evidence of pulmonary embolism through the proximal segmental pulmonary arterial level. Cardiomegaly. Three-vessel coronary artery calcifications. No pericardial effusion. Mediastinum/Nodes: Prominent mediastinal and hilar lymph nodes, likely reactive to airspace disease. Partially fluid-filled, thickened esophagus. Lungs/Pleura: Very extensive heterogeneous airspace opacity throughout the bilateral lung bases no pleural effusion or pneumothorax. Musculoskeletal: No chest wall abnormality. Expansile, lytic lesion of the left fourth rib head measuring 3.2 x 2.2 cm (series 12, image 97). Review of the MIP images confirms the above findings. CT ABDOMEN PELVIS FINDINGS Hepatobiliary: No solid liver abnormality is seen. Hepatic steatosis. No gallstones, gallbladder wall thickening, or biliary dilatation. Pancreas: Unremarkable. No pancreatic ductal dilatation or surrounding inflammatory changes. Spleen: Normal in size without significant abnormality. Adrenals/Urinary Tract: Adrenal glands are unremarkable. Kidneys are normal, without renal calculi, solid lesion, or hydronephrosis. Bladder is unremarkable. Stomach/Bowel: Stomach is within  normal limits. Appendix appears normal (series 2, image 67). Multiple loops of gas and fluid distended mid small bowel measuring up to 5.1 cm in caliber (series 2, image 50). There are relatively decompressed loops of small bowel in the distal right hemiabdomen without a clear transition point, and there is gas and stool present in the colon to the rectum. Vascular/Lymphatic: Severe aortic atherosclerosis. No enlarged abdominal or pelvic lymph nodes. Reproductive: No mass or other significant abnormality. Other: No abdominal wall hernia or abnormality. No ascites. Musculoskeletal: No acute or significant  osseous findings. Lytic osseous lesion of the right ilium measuring 3.9 x 2.0 cm (series 2, image 65) and of the left ilium measuring 2.7 x 2.2 cm (series 2, image 77). Possible lytic lesion versus Schmorl's node of the superior endplate of L5 (series 10, image 109) IMPRESSION: 1. Evaluation for pulmonary embolism is limited by breath motion artifact, particularly in the lung bases. Within this limitation, no evidence of pulmonary embolism through the proximal segmental pulmonary arterial level. 2. Very extensive heterogeneous airspace opacity throughout the bilateral lung bases, consistent with infection or aspiration. The esophagus is thickened and partially fluid-filled, suggesting reflux and aspiration. 3. Prominent mediastinal and hilar lymph nodes, likely reactive to airspace disease. 4. Multiple loops of gas and fluid distended mid small bowel measuring up to 5.1 cm in caliber. Relatively decompressed loops of small bowel in the distal right hemiabdomen without a clear transition point, and there is gas and stool present in the colon to the rectum. Findings are most consistent with ileus, although partial or developing distal small bowel obstruction is not excluded. 5. 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 No candidate primary malignancy identified in the chest, abdomen, or pelvis. 6. Cardiomegaly and coronary artery disease. 7. Hepatic steatosis. Aortic Atherosclerosis (ICD10-I70.0). Electronically Signed   By: Jearld Lesch M.D.   On: 06/04/2023 12:03   DG Chest Portable 1 View Result Date: 06/04/2023 CLINICAL DATA:  Cough.  Nausea.  Emesis. EXAM: PORTABLE CHEST 1 VIEW COMPARISON:  None Available. FINDINGS: 6th posterolateral right rib deformity secondary to remote fracture. Numerous leads and wires project over the chest. Midline trachea. Normal heart size for level of inspiration. No pleural effusion or pneumothorax.  Nonspecific lower lung predominant interstitial coarsening. No lobar consolidation. IMPRESSION: Lower lung predominant interstitial coarsening is nonspecific, especially without priors for comparison. Given patient is a never smoker, findings are suspicious for viral or mycoplasma pneumonia. Sequelae of chronic bronchitis/asthma could look similar. Electronically Signed   By: Jeronimo Greaves M.D.   On: 06/04/2023 10:33    Procedures Procedures    Medications Ordered in ED Medications  Ampicillin-Sulbactam (UNASYN) 3 g in sodium chloride 0.9 % 100 mL IVPB (has no administration in time range)  azithromycin (ZITHROMAX) 500 mg in sodium chloride 0.9 % 250 mL IVPB (has no administration in time range)  sodium chloride 0.9 % bolus 1,000 mL (has no administration in time range)  ipratropium-albuterol (DUONEB) 0.5-2.5 (3) MG/3ML nebulizer solution 3 mL (3 mLs Nebulization Given 06/04/23 1001)  iohexol (OMNIPAQUE) 350 MG/ML injection 80 mL (80 mLs Intravenous Contrast Given 06/04/23 1136)    ED Course/ Medical Decision Making/ A&P                                 Medical Decision Making Amount and/or Complexity of Data Reviewed Labs: ordered. Radiology:  ordered.  Risk Prescription drug management.   Patient shortness of breath cough.  Now some worsening viral syndrome is in a new hypoxia.  Will get x-ray to start with.  Will get basic blood work.  Will require CT imaging.  Will also get blood work to evaluate for right upper quadrant tenderness causes include biliary disease but also musculoskeletal pain.  LFTs mildly elevated.  Flu test did come back positive.  Also CT scan done and showed pneumonia.  Discussed with Dr. Marchelle Gearing from pulmonary since he is his patient.  He reviewed CT scan with me and we think patient benefit from antibiotics at this time.  Also added procalcitonin strep and Legionella urinary antigens.  Will require admission to hospital due to new oxygen requirement.  CT scan  of the abdomen pelvis does show potential ileus versus small bowel obstruction.  Also potentially metastatic disease.  Will admit to hospitalist.  Patient has had nausea and vomiting.  Weakness and decreased oral intake.  Creatinine elevated but unsure of baseline.  Will give fluid bolus.  Will also now add blood culture and lactic acid since has increased creatinine and potential pneumonia.  Discussed with patient and wife.  Told him potential ileus and also potentially metastatic disease.  CRITICAL CARE Performed by: Benjiman Core Total critical care time: 30 minutes Critical care time was exclusive of separately billable procedures and treating other patients. Critical care was necessary to treat or prevent imminent or life-threatening deterioration. Critical care was time spent personally by me on the following activities: development of treatment plan with patient and/or surrogate as well as nursing, discussions with consultants, evaluation of patient's response to treatment, examination of patient, obtaining history from patient or surrogate, ordering and performing treatments and interventions, ordering and review of laboratory studies, ordering and review of radiographic studies, pulse oximetry and re-evaluation of patient's condition.         Final Clinical Impression(s) / ED Diagnoses Final diagnoses:  Pneumonia due to infectious organism, unspecified laterality, unspecified part of lung  Influenza    Rx / DC Orders ED Discharge Orders     None         Benjiman Core, MD 06/04/23 1212
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
## 2023-06-04 NOTE — ED Triage Notes (Signed)
 Pt BIBA from home for nausea, emesis, diarrhea and RLQ abdominal pain with guarding and tenderness for 1 week.

## 2023-06-04 NOTE — H&P (Addendum)
 History and Physical    Patient: David Mcgrath ZOX:[id number] DOB: March 02, 1951 DOA: 06/04/2023 DOS: the patient was seen and examined on 06/04/2023 PCP: Charlane Ferretti, DO  Patient coming from: Home  Chief Complaint:  Chief Complaint  Patient presents with   Abdominal Pain    RLQ    HPI: David Mcgrath is a 73 y.o. male with medical history significant of  gout, chronic idiopathic neuropathy, hypertension, hyperlipidemia, GERD, anxiety and depression. He presents to [facility] ED with a 6-7 day history of increased dyspnea, fatigue, poor PO intake, (R) sided abdominal pain, nausea, vomiting, diarrhea, and ongoing dry cough since June 2024.  History obtained from patient and wife at bedside. He was seen by his PCP and most recently Pulmonology in Jan 2025 for his cough. Symptoms were attributed to bronchitis and patient has been given steroids and antibiotics, he reports have these have not helped. Denies chest pain, palpitations, fevers, chills, or known sick contacts.   ED Course: On arrival to [facility] ED patient was noted to be afebrile temperature 37.1 C, BP 141/56, HR 40, RR 23, SpO2 89% on room air and later 94% on 2 to 3 L via nasal cannula.  Labs notable for sodium 132, CO2 21, glucose 112, BUN 38, creatinine 1.95, calcium 8.2, total protein 6.3, bili 3.3, AST 96, ALT 49, respiratory panel positive for influenza A, and lactic acid 1.5.  CXR obtained and shows predominant interstitial coarsening suspicious for viral Mycoplasma pneumonia.  CTA PE obtained and negative for pulmonary embolus does show airspace opacities throughout the bilateral lung bases with as well as esophagus that is thickened and partially fluid filled suggestive of reflux and aspiration. CT abd/pelvis obtained and shows multiple loopss of gas and fluid distended small bowel consistent with ileus, no transition point. 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 EDP curbsided Dr Marchelle Gearing patients outpatient pulmonologist who recommended empirically covering with Unasyn and Zithromax. Patient was given Unasyn, Zithromax, 1L NS bolus, and Duoneb. TRH contacted for admission.  Review of Systems: As mentioned in the history of present illness. All other systems reviewed and are negative. Past Medical History:  Diagnosis Date   Neuropathy    History reviewed. No pertinent surgical history. Social History:  reports that he has never smoked. He has never used smokeless tobacco. He reports current alcohol use. He reports that he does not use drugs.  Allergies  Allergen Reactions   Shrimp [Shellfish Allergy]     Family History  Problem Relation Age of Onset   Diabetes Mother    Cancer Mother    Heart disease Mother    Cancer Father    Hyperlipidemia Father    Neuropathy Paternal Uncle    Migraines Neg Hx     Prior to Admission medications   Medication Sig Start Date End Date Taking? Authorizing Provider  allopurinol (ZYLOPRIM) 300 MG tablet allopurinol 300 mg tablet    [provider]  cholecalciferol (VITAMIN D3) 25 MCG (1000 UNIT) tablet Take 5,000 Units by mouth daily.    [provider]  Cyanocobalamin (VITAMIN B12) 1000 MCG TBCR 1 tablet Orally Once a day    [provider]  ezetimibe (ZETIA) 10 MG tablet Take 10 mg by mouth daily. 06/01/19   [provider]  FLUoxetine (PROZAC) 10 MG tablet Take 20 mg by mouth 2 (two) times daily.    [provider]  gabapentin (  NEURONTIN) 300 MG capsule Take 2-3 capsules (600-900 mg total) by mouth 3 (three) times daily. Pt will take 600 mg in am, 600 mg at lunch and 900 mg in evening X 1 week, then increase to 600 mg in am, 900 mg in afternoon and 900 mg in evening X 1 week and if tolerated increase to 900 mg TID. 03/23/23   Micki Riley, MD  indomethacin (INDOCIN) 50 MG capsule daily as needed.    [provider]   L-Methylfolate-B12-B6-B2 (CEREFOLIN) 09-02-48-5 MG TABS Take 1 capsule by mouth daily. 03/14/23   Micki Riley, MD  MELATONIN PO Take 5 mg by mouth.    [provider]  olmesartan (BENICAR) 20 MG tablet Take 10 mg by mouth daily.    [provider]  pravastatin (PRAVACHOL) 80 MG tablet Take 80 mg by mouth at bedtime.    [provider]  pyridoxine (B-6) 100 MG tablet 1 tablet Orally Once a day    [provider]  topiramate (TOPAMAX) 25 MG tablet Take 1 tablet (25 mg total) by mouth 2 (two) times daily. Must be seen for further refills. Call [phone]. 04/06/20   Micki Riley, MD    Physical Exam: Vitals:   06/04/23 0935 06/04/23 1435  BP: (!) 141/56   Pulse: (!) 40   Resp: (!) 24   Temp: 98.8 F (37.1 C) 98.3 F (36.8 C)  TempSrc: Oral Oral  SpO2: (!) 89%    Constitutional: NAD, calm, comfortable Eyes: PERRL, lids and conjunctivae normal ENMT: Mucous membranes are moist. Posterior pharynx clear of any exudate or lesions. Neck: normal, supple, no masses, no thyromegaly Respiratory: clear to auscultation bilaterally, no wheezing, no crackles. Tachypnea. No accessory muscle use.  Cardiovascular: Increased rate and regular rhythm, no murmurs / rubs / gallops. No extremity edema. 2+ radial and pedal pulses.  Abdomen: RUQ abdominal tenderness. No masses palpated. No hepatosplenomegaly. Bowel sounds positive.  Musculoskeletal: no clubbing / cyanosis. No joint deformity upper and lower extremities. Good ROM, no contractures. Normal muscle tone.  Skin: Warm, dry. No rashes, lesions, ulcers.  Neurologic: CN 2-12 grossly intact. Alert and oriented x 3.   Data Reviewed: CBC    Component Value Date/Time   WBC 7.1 06/04/2023 1009   RBC 3.95 (L) 06/04/2023 1009   HGB 12.5 (L) 06/04/2023 1009   HCT 38.0 (L) 06/04/2023 1009   PLT 224 06/04/2023 1009   MCV 96.2 06/04/2023 1009   MCH 31.6 06/04/2023 1009   MCHC 32.9 06/04/2023 1009   RDW 13.7  06/04/2023 1009   LYMPHSABS 0.5 (L) 06/04/2023 1009   MONOABS 0.4 06/04/2023 1009   EOSABS 0.0 06/04/2023 1009   BASOSABS 0.0 06/04/2023 1009   CMP     Component Value Date/Time   NA 132 (L) 06/04/2023 1009   K 4.5 06/04/2023 1009   CL 100 06/04/2023 1009   CO2 21 (L) 06/04/2023 1009   GLUCOSE 112 (H) 06/04/2023 1009   BUN 38 (H) 06/04/2023 1009   CREATININE 1.95 (H) 06/04/2023 1009   CALCIUM 8.2 (L) 06/04/2023 1009   PROT 6.3 (L) 06/04/2023 1009   PROT 7.0 01/09/2020 1445   ALBUMIN 3.3 (L) 06/04/2023 1009   AST 96 (H) 06/04/2023 1009   ALT 49 (H) 06/04/2023 1009   ALKPHOS 52 06/04/2023 1009   BILITOT 0.6 06/04/2023 1009   GFRNONAA 36 (L) 06/04/2023 1009   Lactic Acid, Venous    Component Value Date/Time   LATICACIDVEN 1.5 06/04/2023 1346  Results for orders placed or performed during the hospital encounter of 06/04/23  Resp panel by RT-PCR (RSV, Flu A&B, Covid) Anterior Nasal Swab     Status: Abnormal   Collection Time: 06/04/23 10:17 AM   Specimen: Anterior Nasal Swab  Result Value Ref Range Status   SARS Coronavirus 2 by RT PCR NEGATIVE NEGATIVE Final    Comment: (NOTE) SARS-CoV-2 target nucleic acids are NOT DETECTED.  The SARS-CoV-2 RNA is generally detectable in upper respiratory specimens during the acute phase of infection. The lowest concentration of SARS-CoV-2 viral copies this assay can detect is 138 copies/mL. A negative result does not preclude SARS-Cov-2 infection and should not be used as the sole basis for treatment or other patient management decisions. A negative result may occur with  improper specimen collection/handling, submission of specimen other than nasopharyngeal swab, presence of viral mutation(s) within the areas targeted by this assay, and inadequate number of viral copies(<138 copies/mL). A negative result must be combined with clinical observations, patient history, and epidemiological information. The expected result is  Negative.  Fact Sheet for Patients:  BloggerCourse.com  Fact Sheet for Healthcare Providers:  SeriousBroker.it  This test is no t yet approved or cleared by the [place] FDA and  has been authorized for detection and/or diagnosis of SARS-CoV-2 by FDA under an Emergency Use Authorization (EUA). This EUA will remain  in effect (meaning this test can be used) for the duration of the COVID-19 declaration under Section 564(b)(1) of the Act, 21 U.S.C.section 360bbb-3(b)(1), unless the authorization is terminated  or revoked sooner.       Influenza A by PCR POSITIVE (A) NEGATIVE Final   Influenza B by PCR NEGATIVE NEGATIVE Final    Comment: (NOTE) The Xpert Xpress SARS-CoV-2/FLU/RSV plus assay is intended as an aid in the diagnosis of influenza from Nasopharyngeal swab specimens and should not be used as a sole basis for treatment. Nasal washings and aspirates are unacceptable for Xpert Xpress SARS-CoV-2/FLU/RSV testing.  Fact Sheet for Patients: BloggerCourse.com  Fact Sheet for Healthcare Providers: SeriousBroker.it  This test is not yet approved or cleared by the [place] FDA and has been authorized for detection and/or diagnosis of SARS-CoV-2 by FDA under an Emergency Use Authorization (EUA). This EUA will remain in effect (meaning this test can be used) for the duration of the COVID-19 declaration under Section 564(b)(1) of the Act, 21 U.S.C. section 360bbb-3(b)(1), unless the authorization is terminated or revoked.     Resp Syncytial Virus by PCR NEGATIVE NEGATIVE Final    Comment: (NOTE) Fact Sheet for Patients: BloggerCourse.com  Fact Sheet for Healthcare Providers: SeriousBroker.it  This test is not yet approved or cleared by the [place] FDA and has been authorized for detection and/or diagnosis of  SARS-CoV-2 by FDA under an Emergency Use Authorization (EUA). This EUA will remain in effect (meaning this test can be used) for the duration of the COVID-19 declaration under Section 564(b)(1) of the Act, 21 U.S.C. section 360bbb-3(b)(1), unless the authorization is terminated or revoked.  Performed at [facility], [address] [place], [place] [address]    CT Angio Chest PE W and/or Wo Contrast Result Date: 06/04/2023 CLINICAL DATA:  Abdominal pain, emesis, diarrhea, right lower abdominal pain and guarding for 1 week, shortness of breath, PE suspected * Tracking Code: BO * EXAM: CT ANGIOGRAPHY CHEST CT ABDOMEN AND PELVIS WITH CONTRAST TECHNIQUE: Multidetector CT imaging of the chest was performed using the standard protocol during bolus administration of intravenous  contrast. Multiplanar CT image reconstructions and MIPs were obtained to evaluate the vascular anatomy. Multidetector CT imaging of the abdomen and pelvis was performed using the standard protocol during bolus administration of intravenous contrast. RADIATION DOSE REDUCTION: This exam was performed according to the departmental dose-optimization program which includes automated exposure control, adjustment of the mA and/or kV according to patient size and/or use of iterative reconstruction technique. CONTRAST:  80mL OMNIPAQUE IOHEXOL 350 MG/ML SOLN COMPARISON:  None Available. FINDINGS: CT CHEST ANGIOGRAM FINDINGS Cardiovascular: Evaluation for pulmonary embolism is limited by breath motion artifact, particularly in the lung bases. Within this limitation, no evidence of pulmonary embolism through the proximal segmental pulmonary arterial level. Cardiomegaly. Three-vessel coronary artery calcifications. No pericardial effusion. Mediastinum/Nodes: Prominent mediastinal and hilar lymph nodes, likely reactive to airspace disease. Partially fluid-filled, thickened esophagus. Lungs/Pleura: Very extensive heterogeneous  airspace opacity throughout the bilateral lung bases no pleural effusion or pneumothorax. Musculoskeletal: No chest wall abnormality. Expansile, lytic lesion of the left fourth rib head measuring 3.2 x 2.2 cm (series 12, image 97). Review of the MIP images confirms the above findings. CT ABDOMEN PELVIS FINDINGS Hepatobiliary: No solid liver abnormality is seen. Hepatic steatosis. No gallstones, gallbladder wall thickening, or biliary dilatation. Pancreas: Unremarkable. No pancreatic ductal dilatation or surrounding inflammatory changes. Spleen: Normal in size without significant abnormality. Adrenals/Urinary Tract: Adrenal glands are unremarkable. Kidneys are normal, without renal calculi, solid lesion, or hydronephrosis. Bladder is unremarkable. Stomach/Bowel: Stomach is within normal limits. Appendix appears normal (series 2, image 67). Multiple loops of gas and fluid distended mid small bowel measuring up to 5.1 cm in caliber (series 2, image 50). There are relatively decompressed loops of small bowel in the distal right hemiabdomen without a clear transition point, and there is gas and stool present in the colon to the rectum. Vascular/Lymphatic: Severe aortic atherosclerosis. No enlarged abdominal or pelvic lymph nodes. Reproductive: No mass or other significant abnormality. Other: No abdominal wall hernia or abnormality. No ascites. Musculoskeletal: No acute or significant osseous findings. Lytic osseous lesion of the right ilium measuring 3.9 x 2.0 cm (series 2, image 65) and of the left ilium measuring 2.7 x 2.2 cm (series 2, image 77). Possible lytic lesion versus Schmorl's node of the superior endplate of L5 (series 10, image 109) IMPRESSION: 1. Evaluation for pulmonary embolism is limited by breath motion artifact, particularly in the lung bases. Within this limitation, no evidence of pulmonary embolism through the proximal segmental pulmonary arterial level. 2. Very extensive heterogeneous airspace  opacity throughout the bilateral lung bases, consistent with infection or aspiration. The esophagus is thickened and partially fluid-filled, suggesting reflux and aspiration. 3. Prominent mediastinal and hilar lymph nodes, likely reactive to airspace disease. 4. Multiple loops of gas and fluid distended mid small bowel measuring up to 5.1 cm in caliber. Relatively decompressed loops of small bowel in the distal right hemiabdomen without a clear transition point, and there is gas and stool present in the colon to the rectum. Findings are most consistent with ileus, although partial or developing distal small bowel obstruction is not excluded. 5. 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 No candidate primary malignancy identified in the chest, abdomen, or pelvis. 6. Cardiomegaly and coronary artery disease. 7. Hepatic steatosis. Aortic Atherosclerosis (ICD10-I70.0). Electronically Signed   By: Jearld Lesch M.D.   On: 06/04/2023 12:03   CT ABDOMEN PELVIS W CONTRAST Result Date: 06/04/2023 CLINICAL DATA:  Abdominal pain, emesis,  diarrhea, right lower abdominal pain and guarding for 1 week, shortness of breath, PE suspected * Tracking Code: BO * EXAM: CT ANGIOGRAPHY CHEST CT ABDOMEN AND PELVIS WITH CONTRAST TECHNIQUE: Multidetector CT imaging of the chest was performed using the standard protocol during bolus administration of intravenous contrast. Multiplanar CT image reconstructions and MIPs were obtained to evaluate the vascular anatomy. Multidetector CT imaging of the abdomen and pelvis was performed using the standard protocol during bolus administration of intravenous contrast. RADIATION DOSE REDUCTION: This exam was performed according to the departmental dose-optimization program which includes automated exposure control, adjustment of the mA and/or kV according to patient size and/or use of iterative reconstruction technique. CONTRAST:   80mL OMNIPAQUE IOHEXOL 350 MG/ML SOLN COMPARISON:  None Available. FINDINGS: CT CHEST ANGIOGRAM FINDINGS Cardiovascular: Evaluation for pulmonary embolism is limited by breath motion artifact, particularly in the lung bases. Within this limitation, no evidence of pulmonary embolism through the proximal segmental pulmonary arterial level. Cardiomegaly. Three-vessel coronary artery calcifications. No pericardial effusion. Mediastinum/Nodes: Prominent mediastinal and hilar lymph nodes, likely reactive to airspace disease. Partially fluid-filled, thickened esophagus. Lungs/Pleura: Very extensive heterogeneous airspace opacity throughout the bilateral lung bases no pleural effusion or pneumothorax. Musculoskeletal: No chest wall abnormality. Expansile, lytic lesion of the left fourth rib head measuring 3.2 x 2.2 cm (series 12, image 97). Review of the MIP images confirms the above findings. CT ABDOMEN PELVIS FINDINGS Hepatobiliary: No solid liver abnormality is seen. Hepatic steatosis. No gallstones, gallbladder wall thickening, or biliary dilatation. Pancreas: Unremarkable. No pancreatic ductal dilatation or surrounding inflammatory changes. Spleen: Normal in size without significant abnormality. Adrenals/Urinary Tract: Adrenal glands are unremarkable. Kidneys are normal, without renal calculi, solid lesion, or hydronephrosis. Bladder is unremarkable. Stomach/Bowel: Stomach is within normal limits. Appendix appears normal (series 2, image 67). Multiple loops of gas and fluid distended mid small bowel measuring up to 5.1 cm in caliber (series 2, image 50). There are relatively decompressed loops of small bowel in the distal right hemiabdomen without a clear transition point, and there is gas and stool present in the colon to the rectum. Vascular/Lymphatic: Severe aortic atherosclerosis. No enlarged abdominal or pelvic lymph nodes. Reproductive: No mass or other significant abnormality. Other: No abdominal wall hernia or  abnormality. No ascites. Musculoskeletal: No acute or significant osseous findings. Lytic osseous lesion of the right ilium measuring 3.9 x 2.0 cm (series 2, image 65) and of the left ilium measuring 2.7 x 2.2 cm (series 2, image 77). Possible lytic lesion versus Schmorl's node of the superior endplate of L5 (series 10, image 109) IMPRESSION: 1. Evaluation for pulmonary embolism is limited by breath motion artifact, particularly in the lung bases. Within this limitation, no evidence of pulmonary embolism through the proximal segmental pulmonary arterial level. 2. Very extensive heterogeneous airspace opacity throughout the bilateral lung bases, consistent with infection or aspiration. The esophagus is thickened and partially fluid-filled, suggesting reflux and aspiration. 3. Prominent mediastinal and hilar lymph nodes, likely reactive to airspace disease. 4. Multiple loops of gas and fluid distended mid small bowel measuring up to 5.1 cm in caliber. Relatively decompressed loops of small bowel in the distal right hemiabdomen without a clear transition point, and there is gas and stool present in the colon to the rectum. Findings are most consistent with ileus, although partial or developing distal small bowel obstruction is not excluded. 5. 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 No candidate primary malignancy identified  in the chest, abdomen, or pelvis. 6. Cardiomegaly and coronary artery disease. 7. Hepatic steatosis. Aortic Atherosclerosis (ICD10-I70.0). Electronically Signed   By: Jearld Lesch M.D.   On: 06/04/2023 12:03   DG Chest Portable 1 View Result Date: 06/04/2023 CLINICAL DATA:  Cough.  Nausea.  Emesis. EXAM: PORTABLE CHEST 1 VIEW COMPARISON:  None Available. FINDINGS: 6th posterolateral right rib deformity secondary to remote fracture. Numerous leads and wires project over the chest. Midline trachea. Normal heart size for  level of inspiration. No pleural effusion or pneumothorax. Nonspecific lower lung predominant interstitial coarsening. No lobar consolidation. IMPRESSION: Lower lung predominant interstitial coarsening is nonspecific, especially without priors for comparison. Given patient is a never smoker, findings are suspicious for viral or mycoplasma pneumonia. Sequelae of chronic bronchitis/asthma could look similar. Electronically Signed   By: Jeronimo Greaves M.D.   On: 06/04/2023 10:33        Assessment and Plan: #Acute Hypoxic Respiratory Failure #Influenza A #CAP vs Aspiration Pneumonia Outside of window for Tamiflu, symptoms began >48 hours ago. - Covered with Unasyn and Zithromax in ED per Pulmonology recommendation, continue pending Legionella and Urine strep pneumoniae - Supportive care: Mucinex, tessalon pearls, PRN nebulizers - Supplemental O2 as needed - Pulmonary toilet: Mobilize/incentive spirometry/flutter valve    #Ileus CT Abdomen suggestive of ileus or early SBO. He does not have a history of small bowel obstruction or abdominal surgeries. No episodes of emesis in ED and nausea subsided, will hold off on NGT placement for now. - Keep NPO  - MIVF at 100 mL/hr - Daily BMP + Mg - PRN electrolyte correction for goal K >4 and Mg >2 - Analgesia as needed - PRN antiemetics  #Acute Kidney Injury  - IVF hydration- 1L NS bolus now followed by NS at 100 mL/hr - Hold home olmesartan  - Avoid nephrotoxins, Contrast Dyes, Hypotension, and Dehydration  - Renally Adjust Meds - Continue to Monitor and Trend Renal Function carefully, repeat BMP with AM labs   #Hepatic Steatosis #Elevated LFTs - Counseled on weight loss, healthy food choices, and need for 150 mins of moderate aerobic activity weekly. - Hold home statin  #Hyperlipidemia - Hold home statin  #Diarrhea In setting of Influenza A - GI pathogen panel - IVF hydration  #Hypertension - Hold home Olmesartan - PRN  hydralazine  #Osseous Lesions of (L) 4th rib, (R) Ilium, and L5 Verterbal Body - Will need outpatient follow up  Advance Care Planning:   Code Status: Full Code   Consults: N/A  Family Communication: Wife at bedside  Severity of Illness: The appropriate patient status for this patient is INPATIENT. Inpatient status is judged to be reasonable and necessary in order to provide the required intensity of service to ensure the patient's safety. The patient's presenting symptoms, physical exam findings, and initial radiographic and laboratory data in the context of their chronic comorbidities is felt to place them at high risk for further clinical deterioration. Furthermore, it is not anticipated that the patient will be medically stable for discharge from the hospital within 2 midnights of admission.   * I certify that at the point of admission it is my clinical judgment that the patient will require inpatient hospital care spanning beyond 2 midnights from the point of admission due to high intensity of service, high risk for further deterioration and high frequency of surveillance required.*  To reach the provider On-Call:   7AM- 7PM see care teams to locate the attending and reach out to them via  [email or web address] Password: [email or web address] 7PM-7AM contact night-coverage If you still have difficulty reaching the appropriate provider, please page the [facility] (Director on Call) for [facility] on amion for assistance  This document was prepared using Conservation officer, historic buildings and may include unintentional dictation errors.  Bishop Limbo FNP-BC, PMHNP-BC Nurse Practitioner [facility] [place]  For on call review [email or web address].

## 2023-06-05 ENCOUNTER — Inpatient Hospital Stay (HOSPITAL_COMMUNITY)

## 2023-06-05 ENCOUNTER — Ambulatory Visit: Payer: Medicare Other | Admitting: Physical Therapy

## 2023-06-05 ENCOUNTER — Ambulatory Visit (HOSPITAL_COMMUNITY): Payer: Medicare Other

## 2023-06-05 ENCOUNTER — Other Ambulatory Visit: Payer: Self-pay

## 2023-06-05 DIAGNOSIS — J09X1 Influenza due to identified novel influenza A virus with pneumonia: Secondary | ICD-10-CM | POA: Diagnosis not present

## 2023-06-05 DIAGNOSIS — J690 Pneumonitis due to inhalation of food and vomit: Secondary | ICD-10-CM | POA: Diagnosis not present

## 2023-06-05 LAB — BLOOD CULTURE ID PANEL (REFLEXED) - BCID2

## 2023-06-05 LAB — CBC
HCT: 31.7 % — ABNORMAL LOW (ref 39.0–52.0)
Hemoglobin: 10.2 g/dL — ABNORMAL LOW (ref 13.0–17.0)
MCH: 32 pg (ref 26.0–34.0)
MCHC: 32.2 g/dL (ref 30.0–36.0)
MCV: 99.4 fL (ref 80.0–100.0)
Platelets: 186 10*3/uL (ref 150–400)
RBC: 3.19 MIL/uL — ABNORMAL LOW (ref 4.22–5.81)
RDW: 14 % (ref 11.5–15.5)
WBC: 12.3 10*3/uL — ABNORMAL HIGH (ref 4.0–10.5)
nRBC: 0 % (ref 0.0–0.2)

## 2023-06-05 LAB — COMPREHENSIVE METABOLIC PANEL
ALT: 43 U/L (ref 0–44)
AST: 79 U/L — ABNORMAL HIGH (ref 15–41)
Albumin: 2.4 g/dL — ABNORMAL LOW (ref 3.5–5.0)
Alkaline Phosphatase: 38 U/L (ref 38–126)
Anion gap: 9 (ref 5–15)
BUN: 42 mg/dL — ABNORMAL HIGH (ref 8–23)
CO2: 18 mmol/L — ABNORMAL LOW (ref 22–32)
Calcium: 7.5 mg/dL — ABNORMAL LOW (ref 8.9–10.3)
Chloride: 108 mmol/L (ref 98–111)
Creatinine, Ser: 2.11 mg/dL — ABNORMAL HIGH (ref 0.61–1.24)
GFR, Estimated: 33 mL/min — ABNORMAL LOW (ref >60)
Glucose, Bld: 69 mg/dL — ABNORMAL LOW (ref 70–99)
Potassium: 4.3 mmol/L (ref 3.5–5.1)
Sodium: 135 mmol/L (ref 135–145)
Total Bilirubin: 0.8 mg/dL (ref 0.0–1.2)
Total Protein: 5.1 g/dL — ABNORMAL LOW (ref 6.5–8.1)

## 2023-06-05 LAB — MAGNESIUM: Magnesium: 1.9 mg/dL (ref 1.7–2.4)

## 2023-06-05 MED ORDER — SODIUM CHLORIDE 0.9 % IV BOLUS
500.0000 mL | Freq: Once | INTRAVENOUS | Status: AC
  Administered 2023-06-05: 500 mL via INTRAVENOUS

## 2023-06-05 MED ORDER — SODIUM CHLORIDE 0.9 % IV SOLN: INTRAVENOUS | Status: AC

## 2023-06-05 MED ORDER — BOOST / RESOURCE BREEZE PO LIQD CUSTOM
1.0000 | Freq: Three times a day (TID) | ORAL | Status: DC
  Administered 2023-06-06 – 2023-06-15 (×15): 1 via ORAL

## 2023-06-05 NOTE — Telephone Encounter (Signed)
 PT's wife states they need to see when he is discharged at this point because they are unsure of when he will be out and feel well enough to come in.

## 2023-06-05 NOTE — Progress Notes (Signed)
 PROGRESS NOTE    David Mcgrath  HYQ:[id number] DOB: November 22, 1950 DOA: 06/04/2023 PCP: Charlane Ferretti, DO    Brief Narrative:   David Mcgrath is a 73 y.o. male with past medical history significant for basal cell carcinoma, HTN, HLD, neuropathy, gout, anxiety/depression, GERD who presented to [facility], ED on 06/04/2023 with 6S 7-day history of progressive shortness of breath, fatigue, poor oral intake, right-sided abdominal pain, nausea/vomiting/diarrhea. He was seen by his PCP and most recently Pulmonology in Jan 2025 for his cough. Symptoms were attributed to bronchitis and patient has been given steroids and antibiotics, he reports have these have not helped. Denies chest pain, palpitations, fevers, chills, or known sick contacts.    In the ED, temperature 37.1 C, BP 141/56, HR 40, RR 23, SpO2 89% on room air and later 94% on 2 to 3 L via nasal cannula. WBC 7.1, hemoglobin 12.5, platelet count 224.  Sodium 132, CO2 21, glucose 112, BUN 38, creatinine 1.95, calcium 8.2, total protein 6.3, bili 3.3, AST 96, ALT 49, respiratory panel positive for influenza A, and lactic acid 1.5.  CXR obtained and shows predominant interstitial coarsening suspicious for viral Mycoplasma pneumonia.  CTA PE obtained and negative for pulmonary embolus does show airspace opacities throughout the bilateral lung bases with as well as esophagus that is thickened and partially fluid filled suggestive of reflux and aspiration. CT abd/pelvis obtained and shows multiple loopss of gas and fluid distended small bowel consistent with ileus, no transition point. 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 EDP curbsided Dr Marchelle Gearing patients outpatient pulmonologist who recommended empirically covering with Unasyn and Zithromax. Patient was given Unasyn, Zithromax, 1L NS bolus, and Duoneb. TRH contacted for admission.  Assessment & Plan:   Acute  Hypoxic Respiratory Failure Influenza A Aspiration Pneumonia Patient presenting to the ED with progressive shortness of breath, fever, fatigue as well as nausea/vomiting/diarrhea.  CT angiogram chest negative for PE but with airspace opacities throughout the bilateral lung bases with partially fluid-filled esophagus concerning for aspiration/reflux.  Given patient's symptoms began greater than 48 hours prior to ED presentation, outside of window for Tamiflu. -- Azithromycin 500 mg IV q24h -- Unasyn 3g IV q6h -- Continue supplemental oxygen, maintain SpO2 greater than 92% -- Incentive spirometry, flutter valve  Ileus CT Abdomen suggestive of ileus vs early SBO. No history of small bowel obstruction or abdominal surgeries.  -- advance to [facility] today -- NS at 100 mL/hr -- Zofran 4mg  IV q6h PRN    Acute Kidney Injury Patient presenting with a elevated creatinine of 1.95.  No baseline available for review in EMR or Care Everywhere. -- Cr 1.95>2.11 -- continue IVF hydration w/ NS at 100 mL/h -- holding home olmesartan -- Avoid nephrotoxins, renal dose all medications -- BMP daily  Hepatic Steatosis Elevated LFTs -- AST 96>79 -- ALT 49>43 -- Tbili 0.6>0.8 -- Hold home statin -- CMP daily   Hyperlipidemia -- Holding  home statin as above   Diarrhea Likely secondary to influenza A viral infection.  In setting of Influenza A -- GI pathogen panel: pending -- IVF hydration   Hypertension -- Hold home Olmesartan -- PRN hydralazine  Anxiety/depression: -- Prozac 20 mg p.o. twice daily  Neuropathy -- Gabapentin 600 mg p.o. 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  Recent colonoscopy with no concerning  findings per patient.  CT chest, abdomen/pelvis with no findings suggestive of primary malignancy.  Alkaline phosphatase within normal limits.  Unclear etiology of osseous lesions.  Would likely benefit  from outpatient follow-up with PCP/oncology and would likely need outpatient biopsy, PET scan for further workup.   DVT prophylaxis: enoxaparin (LOVENOX) injection 40 mg Start: 06/04/23 2200    Code Status: Full Code Family Communication: Updated spouse present bedside this morning  Disposition Plan:  Level of care: Progressive Status is: Inpatient Remains inpatient appropriate because: IV antibiotics, needs weaning from supplemental oxygen    Consultants:  None  Procedures:  None  Antimicrobials:  Azithromycin 3/2>> Unasyn 3/2>>   Subjective: Patient seen examined at bedside, lying in bed.  Spouse present.  No further nausea/vomiting.  Seen by speech therapy, pending barium swallow.  Will start on clear liquid diet.  Tmax 100.3 past 24 hours.  Remains on 2 L nasal cannula with SpO2 97%.  No other complaints or concerns at this time.  Denies headache, no dizziness, no chest pain, no palpitations, no shortness of breath, no abdominal pain, no chills/night sweats, no current nausea/vomiting, no abdominal pain, no focal weakness, no fatigue, no paresthesia.  No acute events overnight per nursing.  Objective: Vitals:   06/05/23 0343 06/05/23 0523 06/05/23 0526 06/05/23 0605  BP: (!) 84/32 (!) 88/46 (!) 86/50 (!) 102/28  Pulse:      Resp:      Temp:      TempSrc:      SpO2:       No intake or output data in the 24 hours ending 06/05/23 1150 There were no vitals filed for this visit.  Examination:  Physical Exam: GEN: NAD, alert and oriented x 3, wd/wn HEENT: NCAT, PERRL, EOMI, sclera clear, MMM PULM: Breath sounds slight diminished bilateral bases, no wheezes/crackles, normal respiratory effort without accessory muscle use, on 2 L nasal cannula with SpO2 97% at rest CV: RRR w/o M/G/R GI: abd soft, NTND, + tinkling bowel sounds MSK: no peripheral edema, muscle strength globally intact 5/5 bilateral upper/lower extremities NEURO: CN II-XII intact, no focal deficits,  sensation to light touch intact PSYCH: normal mood/affect Integumentary: dry/intact, no rashes or wounds    Data Reviewed: I have personally reviewed following labs and imaging studies  CBC: Recent Labs  Lab 06/04/23 1009 06/05/23 0523  WBC 7.1 12.3*  NEUTROABS 6.2  --   HGB 12.5* 10.2*  HCT 38.0* 31.7*  MCV 96.2 99.4  PLT 224 186   Basic Metabolic Panel: Recent Labs  Lab 06/04/23 1009 06/04/23 1439 06/05/23 0523  NA 132*  --  135  K 4.5  --  4.3  CL 100  --  108  CO2 21*  --  18*  GLUCOSE 112*  --  69*  BUN 38*  --  42*  CREATININE 1.95*  --  2.11*  CALCIUM 8.2*  --  7.5*  MG  --  2.0 1.9   GFR: CrCl cannot be calculated (Unknown ideal weight.). Liver Function Tests: Recent Labs  Lab 06/04/23 1009 06/05/23 0523  AST 96* 79*  ALT 49* 43  ALKPHOS 52 38  BILITOT 0.6 0.8  PROT 6.3* 5.1*  ALBUMIN 3.3* 2.4*   No results for input(s): "LIPASE", "AMYLASE" in the last 168 hours. No results for input(s): "AMMONIA" in the last 168 hours. Coagulation Profile: No results for input(s): "INR", "PROTIME" in the last 168 hours. Cardiac Enzymes: No results for input(s): "CKTOTAL", "CKMB", "CKMBINDEX", "TROPONINI" in the last  168 hours. BNP (last 3 results) No results for input(s): "PROBNP" in the last 8760 hours. HbA1C: No results for input(s): "HGBA1C" in the last 72 hours. CBG: No results for input(s): "GLUCAP" in the last 168 hours. Lipid Profile: No results for input(s): "CHOL", "HDL", "LDLCALC", "TRIG", "CHOLHDL", "LDLDIRECT" in the last 72 hours. Thyroid Function Tests: No results for input(s): "TSH", "T4TOTAL", "FREET4", "T3FREE", "THYROIDAB" in the last 72 hours. Anemia Panel: No results for input(s): "VITAMINB12", "FOLATE", "FERRITIN", "TIBC", "IRON", "RETICCTPCT" in the last 72 hours. Sepsis Labs: Recent Labs  Lab 06/04/23 1346 06/04/23 1439  PROCALCITON  --  0.16  LATICACIDVEN 1.5  --     Recent Results (from the past 240 hours)  Resp panel by  RT-PCR (RSV, Flu A&B, Covid) Anterior Nasal Swab     Status: Abnormal   Collection Time: 06/04/23 10:17 AM   Specimen: Anterior Nasal Swab  Result Value Ref Range Status   SARS Coronavirus 2 by RT PCR NEGATIVE NEGATIVE Final    Comment: (NOTE) SARS-CoV-2 target nucleic acids are NOT DETECTED.  The SARS-CoV-2 RNA is generally detectable in upper respiratory specimens during the acute phase of infection. The lowest concentration of SARS-CoV-2 viral copies this assay can detect is 138 copies/mL. A negative result does not preclude SARS-Cov-2 infection and should not be used as the sole basis for treatment or other patient management decisions. A negative result may occur with  improper specimen collection/handling, submission of specimen other than nasopharyngeal swab, presence of viral mutation(s) within the areas targeted by this assay, and inadequate number of viral copies(<138 copies/mL). A negative result must be combined with clinical observations, patient history, and epidemiological information. The expected result is Negative.  Fact Sheet for Patients:  BloggerCourse.com  Fact Sheet for Healthcare Providers:  SeriousBroker.it  This test is no t yet approved or cleared by the [place] FDA and  has been authorized for detection and/or diagnosis of SARS-CoV-2 by FDA under an Emergency Use Authorization (EUA). This EUA will remain  in effect (meaning this test can be used) for the duration of the COVID-19 declaration under Section 564(b)(1) of the Act, 21 U.S.C.section 360bbb-3(b)(1), unless the authorization is terminated  or revoked sooner.       Influenza A by PCR POSITIVE (A) NEGATIVE Final   Influenza B by PCR NEGATIVE NEGATIVE Final    Comment: (NOTE) The Xpert Xpress SARS-CoV-2/FLU/RSV plus assay is intended as an aid in the diagnosis of influenza from Nasopharyngeal swab specimens and should not be used as a  sole basis for treatment. Nasal washings and aspirates are unacceptable for Xpert Xpress SARS-CoV-2/FLU/RSV testing.  Fact Sheet for Patients: BloggerCourse.com  Fact Sheet for Healthcare Providers: SeriousBroker.it  This test is not yet approved or cleared by the [place] FDA and has been authorized for detection and/or diagnosis of SARS-CoV-2 by FDA under an Emergency Use Authorization (EUA). This EUA will remain in effect (meaning this test can be used) for the duration of the COVID-19 declaration under Section 564(b)(1) of the Act, 21 U.S.C. section 360bbb-3(b)(1), unless the authorization is terminated or revoked.     Resp Syncytial Virus by PCR NEGATIVE NEGATIVE Final    Comment: (NOTE) Fact Sheet for Patients: BloggerCourse.com  Fact Sheet for Healthcare Providers: SeriousBroker.it  This test is not yet approved or cleared by the [place] FDA and has been authorized for detection and/or diagnosis of SARS-CoV-2 by FDA under an Emergency Use Authorization (EUA). This EUA will remain in effect (meaning this test  can be used) for the duration of the COVID-19 declaration under Section 564(b)(1) of the Act, 21 U.S.C. section 360bbb-3(b)(1), unless the authorization is terminated or revoked.  Performed at [facility] [address], [place], [place] [address]   Culture, blood (routine x 2)     Status: None (Preliminary result)   Collection Time: 06/04/23  2:44 PM   Specimen: BLOOD  Result Value Ref Range Status   Specimen Description   Final    BLOOD RIGHT ANTECUBITAL Performed at [facility], [address] [place], [place] [address]    Special Requests   Final    BOTTLES DRAWN AEROBIC AND ANAEROBIC Blood Culture adequate volume Performed at [facility], [address], [place], [place] [address]     Culture   Final    NO GROWTH < 24 HOURS Performed at [facility], [address], [place], [place] [address]    Report Status PENDING  Incomplete  Culture, blood (routine x 2)     Status: None (Preliminary result)   Collection Time: 06/04/23  2:47 PM   Specimen: BLOOD  Result Value Ref Range Status   Specimen Description   Final    BLOOD LEFT ANTECUBITAL Performed at [facility], [address] [place], [place] [address]    Special Requests   Final    BOTTLES DRAWN AEROBIC AND ANAEROBIC Blood Culture results may not be optimal due to an inadequate volume of blood received in culture bottles Performed at [facility], [address] [place], [place] [address]    Culture   Final    NO GROWTH < 24 HOURS Performed at [facility], [address], [place], [place] [address]    Report Status PENDING  Incomplete         Radiology Studies: CT Angio Chest PE W and/or Wo Contrast Result Date: 06/04/2023 CLINICAL DATA:  Abdominal pain, emesis, diarrhea, right lower abdominal pain and guarding for 1 week, shortness of breath, PE suspected * Tracking Code: BO * EXAM: CT ANGIOGRAPHY CHEST CT ABDOMEN AND PELVIS WITH CONTRAST TECHNIQUE: Multidetector CT imaging of the chest was performed using the standard protocol during bolus administration of intravenous contrast. Multiplanar CT image reconstructions and MIPs were obtained to evaluate the vascular anatomy. Multidetector CT imaging of the abdomen and pelvis was performed using the standard protocol during bolus administration of intravenous contrast. RADIATION DOSE REDUCTION: This exam was performed according to the departmental dose-optimization program which includes automated exposure control, adjustment of the mA and/or kV according to patient size and/or use of iterative reconstruction technique. CONTRAST:  80mL OMNIPAQUE IOHEXOL 350 MG/ML SOLN COMPARISON:  None Available. FINDINGS: CT CHEST  ANGIOGRAM FINDINGS Cardiovascular: Evaluation for pulmonary embolism is limited by breath motion artifact, particularly in the lung bases. Within this limitation, no evidence of pulmonary embolism through the proximal segmental pulmonary arterial level. Cardiomegaly. Three-vessel coronary artery calcifications. No pericardial effusion. Mediastinum/Nodes: Prominent mediastinal and hilar lymph nodes, likely reactive to airspace disease. Partially fluid-filled, thickened esophagus. Lungs/Pleura: Very extensive heterogeneous airspace opacity throughout the bilateral lung bases no pleural effusion or pneumothorax. Musculoskeletal: No chest wall abnormality. Expansile, lytic lesion of the left fourth rib head measuring 3.2 x 2.2 cm (series 12, image 97). Review of the MIP images confirms the above findings. CT ABDOMEN PELVIS FINDINGS Hepatobiliary: No solid liver abnormality is seen. Hepatic steatosis. No gallstones, gallbladder wall thickening, or biliary dilatation. Pancreas: Unremarkable. No pancreatic  ductal dilatation or surrounding inflammatory changes. Spleen: Normal in size without significant abnormality. Adrenals/Urinary Tract: Adrenal glands are unremarkable. Kidneys are normal, without renal calculi, solid lesion, or hydronephrosis. Bladder is unremarkable. Stomach/Bowel: Stomach is within normal limits. Appendix appears normal (series 2, image 67). Multiple loops of gas and fluid distended mid small bowel measuring up to 5.1 cm in caliber (series 2, image 50). There are relatively decompressed loops of small bowel in the distal right hemiabdomen without a clear transition point, and there is gas and stool present in the colon to the rectum. Vascular/Lymphatic: Severe aortic atherosclerosis. No enlarged abdominal or pelvic lymph nodes. Reproductive: No mass or other significant abnormality. Other: No abdominal wall hernia or abnormality. No ascites. Musculoskeletal: No acute or significant osseous findings.  Lytic osseous lesion of the right ilium measuring 3.9 x 2.0 cm (series 2, image 65) and of the left ilium measuring 2.7 x 2.2 cm (series 2, image 77). Possible lytic lesion versus Schmorl's node of the superior endplate of L5 (series 10, image 109) IMPRESSION: 1. Evaluation for pulmonary embolism is limited by breath motion artifact, particularly in the lung bases. Within this limitation, no evidence of pulmonary embolism through the proximal segmental pulmonary arterial level. 2. Very extensive heterogeneous airspace opacity throughout the bilateral lung bases, consistent with infection or aspiration. The esophagus is thickened and partially fluid-filled, suggesting reflux and aspiration. 3. Prominent mediastinal and hilar lymph nodes, likely reactive to airspace disease. 4. Multiple loops of gas and fluid distended mid small bowel measuring up to 5.1 cm in caliber. Relatively decompressed loops of small bowel in the distal right hemiabdomen without a clear transition point, and there is gas and stool present in the colon to the rectum. Findings are most consistent with ileus, although partial or developing distal small bowel obstruction is not excluded. 5. 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 No candidate primary malignancy identified in the chest, abdomen, or pelvis. 6. Cardiomegaly and coronary artery disease. 7. Hepatic steatosis. Aortic Atherosclerosis (ICD10-I70.0). Electronically Signed   By: Jearld Lesch M.D.   On: 06/04/2023 12:03   CT ABDOMEN PELVIS W CONTRAST Result Date: 06/04/2023 CLINICAL DATA:  Abdominal pain, emesis, diarrhea, right lower abdominal pain and guarding for 1 week, shortness of breath, PE suspected * Tracking Code: BO * EXAM: CT ANGIOGRAPHY CHEST CT ABDOMEN AND PELVIS WITH CONTRAST TECHNIQUE: Multidetector CT imaging of the chest was performed using the standard protocol during bolus administration of  intravenous contrast. Multiplanar CT image reconstructions and MIPs were obtained to evaluate the vascular anatomy. Multidetector CT imaging of the abdomen and pelvis was performed using the standard protocol during bolus administration of intravenous contrast. RADIATION DOSE REDUCTION: This exam was performed according to the departmental dose-optimization program which includes automated exposure control, adjustment of the mA and/or kV according to patient size and/or use of iterative reconstruction technique. CONTRAST:  80mL OMNIPAQUE IOHEXOL 350 MG/ML SOLN COMPARISON:  None Available. FINDINGS: CT CHEST ANGIOGRAM FINDINGS Cardiovascular: Evaluation for pulmonary embolism is limited by breath motion artifact, particularly in the lung bases. Within this limitation, no evidence of pulmonary embolism through the proximal segmental pulmonary arterial level. Cardiomegaly. Three-vessel coronary artery calcifications. No pericardial effusion. Mediastinum/Nodes: Prominent mediastinal and hilar lymph nodes, likely reactive to airspace disease. Partially fluid-filled, thickened esophagus. Lungs/Pleura: Very extensive heterogeneous airspace opacity throughout the bilateral lung bases no pleural effusion or pneumothorax. Musculoskeletal: No chest wall abnormality. Expansile, lytic lesion of  the left fourth rib head measuring 3.2 x 2.2 cm (series 12, image 97). Review of the MIP images confirms the above findings. CT ABDOMEN PELVIS FINDINGS Hepatobiliary: No solid liver abnormality is seen. Hepatic steatosis. No gallstones, gallbladder wall thickening, or biliary dilatation. Pancreas: Unremarkable. No pancreatic ductal dilatation or surrounding inflammatory changes. Spleen: Normal in size without significant abnormality. Adrenals/Urinary Tract: Adrenal glands are unremarkable. Kidneys are normal, without renal calculi, solid lesion, or hydronephrosis. Bladder is unremarkable. Stomach/Bowel: Stomach is within normal limits.  Appendix appears normal (series 2, image 67). Multiple loops of gas and fluid distended mid small bowel measuring up to 5.1 cm in caliber (series 2, image 50). There are relatively decompressed loops of small bowel in the distal right hemiabdomen without a clear transition point, and there is gas and stool present in the colon to the rectum. Vascular/Lymphatic: Severe aortic atherosclerosis. No enlarged abdominal or pelvic lymph nodes. Reproductive: No mass or other significant abnormality. Other: No abdominal wall hernia or abnormality. No ascites. Musculoskeletal: No acute or significant osseous findings. Lytic osseous lesion of the right ilium measuring 3.9 x 2.0 cm (series 2, image 65) and of the left ilium measuring 2.7 x 2.2 cm (series 2, image 77). Possible lytic lesion versus Schmorl's node of the superior endplate of L5 (series 10, image 109) IMPRESSION: 1. Evaluation for pulmonary embolism is limited by breath motion artifact, particularly in the lung bases. Within this limitation, no evidence of pulmonary embolism through the proximal segmental pulmonary arterial level. 2. Very extensive heterogeneous airspace opacity throughout the bilateral lung bases, consistent with infection or aspiration. The esophagus is thickened and partially fluid-filled, suggesting reflux and aspiration. 3. Prominent mediastinal and hilar lymph nodes, likely reactive to airspace disease. 4. Multiple loops of gas and fluid distended mid small bowel measuring up to 5.1 cm in caliber. Relatively decompressed loops of small bowel in the distal right hemiabdomen without a clear transition point, and there is gas and stool present in the colon to the rectum. Findings are most consistent with ileus, although partial or developing distal small bowel obstruction is not excluded. 5. 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 No candidate primary malignancy identified  in the chest, abdomen, or pelvis. 6. Cardiomegaly and coronary artery disease. 7. Hepatic steatosis. Aortic Atherosclerosis (ICD10-I70.0). Electronically Signed   By: Jearld Lesch M.D.   On: 06/04/2023 12:03   DG Chest Portable 1 View Result Date: 06/04/2023 CLINICAL DATA:  Cough.  Nausea.  Emesis. EXAM: PORTABLE CHEST 1 VIEW COMPARISON:  None Available. FINDINGS: 6th posterolateral right rib deformity secondary to remote fracture. Numerous leads and wires project over the chest. Midline trachea. Normal heart size for level of inspiration. No pleural effusion or pneumothorax. Nonspecific lower lung predominant interstitial coarsening. No lobar consolidation. IMPRESSION: Lower lung predominant interstitial coarsening is nonspecific, especially without priors for comparison. Given patient is a never smoker, findings are suspicious for viral or mycoplasma pneumonia. Sequelae of chronic bronchitis/asthma could look similar. Electronically Signed   By: Jeronimo Greaves M.D.   On: 06/04/2023 10:33        Scheduled Meds:  enoxaparin (LOVENOX) injection  40 mg Subcutaneous QHS   FLUoxetine  20 mg Oral BID   gabapentin  600 mg Oral BID   guaiFENesin  600 mg Oral BID   pantoprazole  40 mg Oral Daily   Continuous Infusions:  sodium chloride 100 mL/hr at 06/05/23 0707   ampicillin-sulbactam (  UNASYN) IV 3 g (06/05/23 1050)   azithromycin       LOS: 1 day    Time spent: 52 minutes spent on chart review, discussion with nursing staff, consultants, updating family and interview/physical exam; more than 50% of that time was spent in counseling and/or coordination of care.    Alvira Philips [place], DO [facility] Available via Epic secure chat 7am-7pm After these hours, please refer to coverage provider listed on [email or web address] 06/05/2023, 11:50 AM

## 2023-06-05 NOTE — TOC Initial Note (Signed)
 Transition of Care [facility]) - Initial/Assessment Note    Patient Details  Name: David Mcgrath MRN: [id number] Date of Birth: [birth year]  Transition of [facility]) CM/SW Contact:    Lanier Clam, RN Phone Number: 06/05/2023, 2:22 PM  Clinical Narrative: d/c plan home.                  Expected Discharge Plan: Home/Self Care Barriers to Discharge: Continued Medical Work up   Patient Goals and CMS Choice Patient states their goals for this hospitalization and ongoing recovery are:: Home CMS Medicare.gov Compare Post Acute Care list provided to:: Patient Choice offered to / list presented to : Patient [place] ownership interest in [facility].provided to:: Patient    Expected Discharge Plan and Services                                              Prior Living Arrangements/Services                       Activities of Daily Living   ADL Screening (condition at time of admission) Independently performs ADLs?: Yes (appropriate for developmental age) Is the patient deaf or have difficulty hearing?: No Does the patient have difficulty seeing, even when wearing glasses/contacts?: No Does the patient have difficulty concentrating, remembering, or making decisions?: Yes  Permission Sought/Granted                  Emotional Assessment              Admission diagnosis:  Ileus (HCC) [K56.7] Pneumonia [J18.9] Influenza [J11.1] Pneumonia due to infectious organism, unspecified laterality, unspecified part of lung [J18.9] Patient Active Problem List   Diagnosis Date Noted   Pneumonia 06/04/2023   Ileus (HCC) 06/04/2023   Acute hypoxic respiratory failure (HCC) 06/04/2023   Flu 06/04/2023   Essential hypertension 06/04/2023   Fatty liver 06/04/2023   AKI (acute kidney injury) (HCC) 06/04/2023   Hyperlipidemia 06/04/2023   Diarrhea 06/04/2023   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 [place], [place] - [address] [place] [address] Phone: [phone] Fax: [phone]  [facility] - [place], [place] - [address] [facility] [address] [place] [address] Phone: [phone] Fax: [phone]     Social Drivers of Health (SDOH) Social History: SDOH Screenings   Food Insecurity: No Food Insecurity (06/04/2023)  Housing: Low Risk  (06/04/2023)  Transportation Needs: No Transportation Needs (06/04/2023)  Utilities: Not At Risk (06/04/2023)  Social Connections: Socially Integrated (06/04/2023)  Tobacco Use: Low Risk  (06/04/2023)   SDOH Interventions:     Readmission Risk Interventions     No data to display

## 2023-06-05 NOTE — Progress Notes (Signed)
 PHARMACY - PHYSICIAN COMMUNICATION CRITICAL VALUE ALERT - BLOOD CULTURE IDENTIFICATION (BCID)  David Mcgrath is an 73 y.o. male who presented to [facility] on 06/04/2023 with a chief complaint of Flu+ and possible CAP.   Assessment: 1/4 Bcx bottles growing staph epi with mecA resistance (MRSE). Likely a contaminant.   Name of physician (or Provider) Contacted: Dr. [place]  Current antibiotics:  Unasyn 3g IV q6h Azithromycin 500mg  IV q24h  Changes to prescribed antibiotics recommended:  Bcx results are likely a contaminant. Recommend continuing current abx for now for CAP coverage.   Results for orders placed or performed during the hospital encounter of 06/04/23  Blood Culture ID Panel (Reflexed) (Collected: 06/04/2023  2:44 PM)  Result Value Ref Range   Enterococcus faecalis NOT DETECTED NOT DETECTED   Enterococcus Faecium NOT DETECTED NOT DETECTED   Listeria monocytogenes NOT DETECTED NOT DETECTED   Staphylococcus species DETECTED (A) NOT DETECTED   Staphylococcus aureus (BCID) NOT DETECTED NOT DETECTED   Staphylococcus epidermidis DETECTED (A) NOT DETECTED   Staphylococcus lugdunensis NOT DETECTED NOT DETECTED   Streptococcus species NOT DETECTED NOT DETECTED   Streptococcus agalactiae NOT DETECTED NOT DETECTED   Streptococcus pneumoniae NOT DETECTED NOT DETECTED   Streptococcus pyogenes NOT DETECTED NOT DETECTED   A.calcoaceticus-baumannii NOT DETECTED NOT DETECTED   Bacteroides fragilis NOT DETECTED NOT DETECTED   Enterobacterales NOT DETECTED NOT DETECTED   Enterobacter cloacae complex NOT DETECTED NOT DETECTED   Escherichia coli NOT DETECTED NOT DETECTED   Klebsiella aerogenes NOT DETECTED NOT DETECTED   Klebsiella oxytoca NOT DETECTED NOT DETECTED   Klebsiella pneumoniae NOT DETECTED NOT DETECTED   Proteus species NOT DETECTED NOT DETECTED   Salmonella species NOT DETECTED NOT DETECTED   Serratia marcescens NOT DETECTED NOT DETECTED   Haemophilus influenzae NOT  DETECTED NOT DETECTED   Neisseria meningitidis NOT DETECTED NOT DETECTED   Pseudomonas aeruginosa NOT DETECTED NOT DETECTED   Stenotrophomonas maltophilia NOT DETECTED NOT DETECTED   Candida albicans NOT DETECTED NOT DETECTED   Candida auris NOT DETECTED NOT DETECTED   Candida glabrata NOT DETECTED NOT DETECTED   Candida krusei NOT DETECTED NOT DETECTED   Candida parapsilosis NOT DETECTED NOT DETECTED   Candida tropicalis NOT DETECTED NOT DETECTED   Cryptococcus neoformans/gattii NOT DETECTED NOT DETECTED   Methicillin resistance mecA/C DETECTED (A) NOT DETECTED    Cherylin Mylar, PharmD Clinical Pharmacist  3/3/20254:08 PM

## 2023-06-05 NOTE — Plan of Care (Signed)
  Problem: Education: Goal: Knowledge of General Education information will improve Description: Including pain rating scale, medication(s)/side effects and non-pharmacologic comfort measures Outcome: Progressing   Problem: Clinical Measurements: Goal: Ability to maintain clinical measurements within normal limits will improve Outcome: Progressing Goal: Respiratory complications will improve Outcome: Progressing   

## 2023-06-05 NOTE — Procedures (Signed)
 Modified Barium Swallow Study  Patient Details  Name: David Mcgrath MRN: [id number] Date of Birth: 1950/09/05  Today's Date: 06/05/2023  Modified Barium Swallow completed.  Full report located under Chart Review in the Imaging Section.  History of Present Illness Patient is a 73 y.o. male with PMH: GERD, chronic idiopathic neuropathy, HTN, HLD, anxiety, depression. He presented to [facility] ED with 6-7 day h/o increased dyspnea, fatigue, poor PO intake, right sided abdominal pain, nausea, vomiting, dairrhea and ongoing dry cough since June of 2024. In Ed he was afebrile, HR 40, RR 23, SpO2 89% on RA, improved to 94% on 2-3L oxygen via nasal cannula. CXR showed predominant interstitial coarsening suspicious for viral Mycoplasma pneumonia.  CTA PE obtained and negative for pulmonary embolus does show airspace opacities throughout the bilateral lung bases with as well as esophagus that is thickened and partially fluid filled suggestive of reflux and aspiration. CT abd/pelvis obtained and shows multiple loopss of gas and fluid distended small bowel consistent with ileus, no transition point. 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 Patient is NPO, awaiting SLP evaluation of swallow function.   Clinical Impression Patient presents with a primary pharyngoesophageal dysphagia as per this MBS. Prominent cricopharyngeal bar observed (no radiologist present to confirm). No penetration or aspiration of any of the tested consistencies was observed during any phase of the swallow. Swallow intiation was delayed at level of vallecular sinus with puree solids, honey thick liquids, posterior laryngeal surface of the epiglottis and at times, pyriform sinus with nectar thick and thin liquids. Anterior hyoid excursion, laryngeal elevation and epiglottic inversion were all complete. Laryngeal vestibular closure was incomplete. Trace to mild  residuals remained in vallecular sinus, posterior pharyngeal wall and pyriform sinus with solids and liquids, however cleared with subsequent swallows. When swallowing 13mm barium tablet with thin liquid barium, patient did have difficulty with bolus cohesion. Prior to tablet transiting with swallow of thin liquid, patient had an instance of gagging versus feeling of regurgitation which resulted in discoordination of swallow. Tablet was seen falling into pharynx and resting on PES in absence of an active swallow response. Barium tablet then transited through PES and esophagus. Barium tablet slowed distally and distal retrograde movement of barium and barium stasis observed. SLP is recommending advance to regular solids, thin liquids when cleared by GI. No further skilled SLP services needed at this time. Factors that may increase risk of adverse event in presence of aspiration [employer]ance Coots 2021): Respiratory or GI disease  Swallow Evaluation Recommendations Recommendations: PO diet PO Diet Recommendation: Regular;Thin liquids (Level 0) Liquid Administration via: Cup;Straw Medication Administration: Whole meds with puree Supervision: Patient able to self-feed Swallowing strategies  : Slow rate;Small bites/sips Postural changes: Position pt fully upright for meals;Stay upright 30-60 min after meals Oral care recommendations: Oral care BID (2x/day)      Angela Nevin, MA, CCC-SLP Speech Therapy

## 2023-06-05 NOTE — Evaluation (Signed)
 Clinical/Bedside Swallow Evaluation Patient Details  Name: Darryle Dennie MRN: [id number] Date of Birth: 05-11-1950  Today's Date: 06/05/2023 Time: SLP Start Time (ACUTE ONLY): 0955 SLP Stop Time (ACUTE ONLY): 1010 SLP Time Calculation (min) (ACUTE ONLY): 15 min  Past Medical History:  Past Medical History:  Diagnosis Date   Neuropathy    Past Surgical History: History reviewed. No pertinent surgical history. HPI:  Patient is a 73 y.o. male with PMH: GERD, chronic idiopathic neuropathy, HTN, HLD, anxiety, depression. He presented to [facility] ED with 6-7 day h/o increased dyspnea, fatigue, poor PO intake, right sided abdominal pain, nausea, vomiting, dairrhea and ongoing dry cough since June of 2024. In Ed he was afebrile, HR 40, RR 23, SpO2 89% on RA, improved to 94% on 2-3L oxygen via nasal cannula. CXR showed predominant interstitial coarsening suspicious for viral Mycoplasma pneumonia.  CTA PE obtained and negative for pulmonary embolus does show airspace opacities throughout the bilateral lung bases with as well as esophagus that is thickened and partially fluid filled suggestive of reflux and aspiration. CT abd/pelvis obtained and shows multiple loopss of gas and fluid distended small bowel consistent with ileus, no transition point. 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 Patient is NPO, awaiting SLP evaluation of swallow function.    Assessment / Plan / Recommendation  Clinical Impression  Patient is presenting with clinical s/s of dysphagia as per this bedside swallow evaluation. SLP suspects an esophageal component secondary to patient's h/o GERD, current suspicion of ileus and this BSE. Patient exhibited decreased hyolaryngeal elevation with sips of thin liquids (water), suspected delayed swallow initiation and belching which occured almost immediately. He exhibited one instance of cough following sips of water.  SLP is recommending to proceed with objective swallow study (MBS) but to allow patient clear liquids in the meantime. SLP Visit Diagnosis: Dysphagia, unspecified (R13.10)    Aspiration Risk  Mild aspiration risk    Diet Recommendation Thin liquid    Liquid Administration via: Straw;Cup Medication Administration: Whole meds with liquid Supervision: Patient able to self feed Compensations: Slow rate;Small sips/bites Postural Changes: Seated upright at 90 degrees;Remain upright for at least 30 minutes after po intake    Other  Recommendations Oral Care Recommendations: Oral care BID    Recommendations for follow up therapy are one component of a multi-disciplinary discharge planning process, led by the attending physician.  Recommendations may be updated based on patient status, additional functional criteria and insurance authorization.  Follow up Recommendations Other (comment) (TBD pending MBS results)      Assistance Recommended at Discharge    Functional Status Assessment Patient has had a recent decline in their functional status and demonstrates the ability to make significant improvements in function in a reasonable and predictable amount of time.  Frequency and Duration min 1 x/week  1 week       Prognosis Prognosis for improved oropharyngeal function: Good      Swallow Study   General Date of Onset: 06/04/23 HPI: Patient is a 73 y.o. male with PMH: GERD, chronic idiopathic neuropathy, HTN, HLD, anxiety, depression. He presented to [facility] ED with 6-7 day h/o increased dyspnea, fatigue, poor PO intake, right sided abdominal pain, nausea, vomiting, dairrhea and ongoing dry cough since June of 2024. In Ed he was afebrile, HR 40, RR 23, SpO2 89% on RA, improved to 94% on 2-3L oxygen via nasal cannula. CXR showed predominant interstitial coarsening suspicious  for viral Mycoplasma pneumonia.  CTA PE obtained and negative for pulmonary embolus does show airspace opacities throughout the  bilateral lung bases with as well as esophagus that is thickened and partially fluid filled suggestive of reflux and aspiration. CT abd/pelvis obtained and shows multiple loopss of gas and fluid distended small bowel consistent with ileus, no transition point. 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 Patient is NPO, awaiting SLP evaluation of swallow function. Type of Study: Bedside Swallow Evaluation Previous Swallow Assessment: none found Diet Prior to this Study: NPO Temperature Spikes Noted: Yes Respiratory Status: Room air History of Recent Intubation: No Behavior/Cognition: Alert;Cooperative;Pleasant mood Oral Cavity Assessment: Dry Oral Care Completed by SLP: No Oral Cavity - Dentition: Adequate natural dentition Vision: Functional for self-feeding Self-Feeding Abilities: Able to feed self Patient Positioning: Upright in bed Baseline Vocal Quality: Normal Volitional Cough: Weak Volitional Swallow: Able to elicit    Oral/Motor/Sensory Function Overall Oral Motor/Sensory Function: Within functional limits   Ice Chips     Thin Liquid Thin Liquid: Impaired Presentation: Straw;Self Fed Pharyngeal  Phase Impairments: Decreased hyoid-laryngeal movement;Suspected delayed Swallow;Cough - Delayed    Nectar Thick     Honey Thick     Puree Puree: Not tested   Solid     Solid: Not tested      Angela Nevin, MA, CCC-SLP Speech Therapy

## 2023-06-06 ENCOUNTER — Telehealth: Payer: Medicare Other | Admitting: Primary Care

## 2023-06-06 DIAGNOSIS — J690 Pneumonitis due to inhalation of food and vomit: Secondary | ICD-10-CM | POA: Diagnosis not present

## 2023-06-06 DIAGNOSIS — J09X1 Influenza due to identified novel influenza A virus with pneumonia: Secondary | ICD-10-CM | POA: Diagnosis not present

## 2023-06-06 LAB — CBC
HCT: 31.9 % — ABNORMAL LOW (ref 39.0–52.0)
Hemoglobin: 10.3 g/dL — ABNORMAL LOW (ref 13.0–17.0)
MCH: 32.3 pg (ref 26.0–34.0)
MCHC: 32.3 g/dL (ref 30.0–36.0)
MCV: 100 fL (ref 80.0–100.0)
Platelets: 177 10*3/uL (ref 150–400)
RBC: 3.19 MIL/uL — ABNORMAL LOW (ref 4.22–5.81)
RDW: 14.2 % (ref 11.5–15.5)
WBC: 6.9 10*3/uL (ref 4.0–10.5)
nRBC: 0 % (ref 0.0–0.2)

## 2023-06-06 LAB — COMPREHENSIVE METABOLIC PANEL
ALT: 40 U/L (ref 0–44)
AST: 71 U/L — ABNORMAL HIGH (ref 15–41)
Albumin: 2.2 g/dL — ABNORMAL LOW (ref 3.5–5.0)
Alkaline Phosphatase: 37 U/L — ABNORMAL LOW (ref 38–126)
Anion gap: 9 (ref 5–15)
BUN: 28 mg/dL — ABNORMAL HIGH (ref 8–23)
CO2: 17 mmol/L — ABNORMAL LOW (ref 22–32)
Calcium: 7.6 mg/dL — ABNORMAL LOW (ref 8.9–10.3)
Chloride: 108 mmol/L (ref 98–111)
Creatinine, Ser: 1.06 mg/dL (ref 0.61–1.24)
GFR, Estimated: >60 mL/min (ref >60)
Glucose, Bld: 61 mg/dL — ABNORMAL LOW (ref 70–99)
Potassium: 3.9 mmol/L (ref 3.5–5.1)
Sodium: 134 mmol/L — ABNORMAL LOW (ref 135–145)
Total Bilirubin: 0.6 mg/dL (ref 0.0–1.2)
Total Protein: 4.8 g/dL — ABNORMAL LOW (ref 6.5–8.1)

## 2023-06-06 LAB — PROCALCITONIN: Procalcitonin: 2.98 ng/mL

## 2023-06-06 LAB — PHOSPHORUS: Phosphorus: 2.2 mg/dL — ABNORMAL LOW (ref 2.5–4.6)

## 2023-06-06 LAB — CULTURE, BLOOD (ROUTINE X 2): Special Requests: ADEQUATE

## 2023-06-06 LAB — MAGNESIUM: Magnesium: 2.1 mg/dL (ref 1.7–2.4)

## 2023-06-06 MED ORDER — HYDROCODONE BIT-HOMATROP MBR 5-1.5 MG/5ML PO SOLN
5.0000 mL | Freq: Four times a day (QID) | ORAL | Status: DC | PRN
  Administered 2023-06-06 – 2023-06-14 (×23): 5 mL via ORAL
  Filled 2023-06-06 (×23): qty 5

## 2023-06-06 MED ORDER — SODIUM CHLORIDE 0.9 % IV SOLN: INTRAVENOUS | Status: DC

## 2023-06-06 MED ORDER — AZITHROMYCIN 500 MG PO TABS
500.0000 mg | ORAL_TABLET | Freq: Every day | ORAL | Status: AC
  Administered 2023-06-07 – 2023-06-08 (×2): 500 mg via ORAL
  Filled 2023-06-06: qty 2
  Filled 2023-06-06: qty 1

## 2023-06-06 NOTE — Progress Notes (Signed)
 PROGRESS NOTE    David Mcgrath  MVH:[id number] DOB: 24-Jun-1950 DOA: 06/04/2023 PCP: Charlane Ferretti, DO    Brief Narrative:   David Mcgrath is a 73 y.o. male with past medical history significant for basal cell carcinoma, HTN, HLD, neuropathy, gout, anxiety/depression, GERD who presented to [facility], ED on 06/04/2023 with 6S 7-day history of progressive shortness of breath, fatigue, poor oral intake, right-sided abdominal pain, nausea/vomiting/diarrhea. He was seen by his PCP and most recently Pulmonology in Jan 2025 for his cough. Symptoms were attributed to bronchitis and patient has been given steroids and antibiotics, he reports have these have not helped. Denies chest pain, palpitations, fevers, chills, or known sick contacts.    In the ED, temperature 37.1 C, BP 141/56, HR 40, RR 23, SpO2 89% on room air and later 94% on 2 to 3 L via nasal cannula. WBC 7.1, hemoglobin 12.5, platelet count 224.  Sodium 132, CO2 21, glucose 112, BUN 38, creatinine 1.95, calcium 8.2, total protein 6.3, bili 3.3, AST 96, ALT 49, respiratory panel positive for influenza A, and lactic acid 1.5.  CXR obtained and shows predominant interstitial coarsening suspicious for viral Mycoplasma pneumonia.  CTA PE obtained and negative for pulmonary embolus does show airspace opacities throughout the bilateral lung bases with as well as esophagus that is thickened and partially fluid filled suggestive of reflux and aspiration. CT abd/pelvis obtained and shows multiple loopss of gas and fluid distended small bowel consistent with ileus, no transition point. 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 EDP curbsided Dr Marchelle Gearing patients outpatient pulmonologist who recommended empirically covering with Unasyn and Zithromax. Patient was given Unasyn, Zithromax, 1L NS bolus, and Duoneb. TRH contacted for admission.  Assessment & Plan:   Acute  Hypoxic Respiratory Failure Influenza A Aspiration Pneumonia Patient presenting to the ED with progressive shortness of breath, fever, fatigue as well as nausea/vomiting/diarrhea.  CT angiogram chest negative for PE but with airspace opacities throughout the bilateral lung bases with partially fluid-filled esophagus concerning for aspiration/reflux.  Given patient's symptoms began greater than 48 hours prior to ED presentation, outside of window for Tamiflu. -- Azithromycin 500 mg PO q24h -- Unasyn 3g IV q6h -- Continue supplemental oxygen, maintain SpO2 greater than 92%; titrated off this morning -- Incentive spirometry, flutter valve  Ileus CT Abdomen suggestive of ileus vs early SBO. No history of small bowel obstruction or abdominal surgeries.  -- advance to FLD today; if tolerates advance to soft diet this afternoon -- NS at 100 mL/hr -- Zofran 4mg  IV q6h PRN    Acute Kidney Injury Patient presenting with a elevated creatinine of 1.95.  No baseline available for review in EMR or Care Everywhere. -- Cr 1.95>2.11>1.06 -- continue IVF hydration w/ NS at 100 mL/h -- holding home olmesartan -- Avoid nephrotoxins, renal dose all medications -- BMP daily  Hepatic Steatosis Elevated LFTs -- AST 96>79 -- ALT 49>43 -- Tbili 0.6>0.8 -- Hold home statin -- CMP daily   Hyperlipidemia -- Holding  home statin as above   Diarrhea: Resolved Likely secondary to influenza A viral infection.  No further diarrhea, will discontinue C. difficile/GI pathogen panel and enteric precautions. -- IVF hydration   Hypertension -- Hold home Olmesartan -- PRN hydralazine  Anxiety/depression: -- Prozac 20 mg p.o. twice daily  Neuropathy -- Gabapentin 600 mg p.o. 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  Recent colonoscopy with no concerning findings per patient.  CT chest, abdomen/pelvis with no findings  suggestive of primary malignancy.  Alkaline phosphatase within normal limits.  Unclear etiology of osseous lesions.  Would likely benefit from outpatient follow-up with PCP/oncology and would likely need outpatient biopsy, PET scan for further workup.   DVT prophylaxis: enoxaparin (LOVENOX) injection 40 mg Start: 06/04/23 2200    Code Status: Full Code Family Communication: Updated spouse present bedside this morning  Disposition Plan:  Level of care: Med-Surg Status is: Inpatient Remains inpatient appropriate because: IV antibiotics    Consultants:  None  Procedures:  None  Antimicrobials:  Azithromycin 3/2>> Unasyn 3/2>>   Subjective: Patient seen examined at bedside, lying in bed.  Spouse present.  Tolerating clear liquid diet; advancing to full liquids this morning.  If tolerates can further advance to soft diet this afternoon.  Remains afebrile, titrated off of supplemental oxygen. No other complaints or concerns at this time.  Denies headache, no dizziness, no chest pain, no palpitations, no shortness of breath, no abdominal pain, no chills/night sweats, no current nausea/vomiting, no abdominal pain, no focal weakness, no fatigue, no paresthesia.  No acute events overnight per nursing.  Objective: Vitals:   06/05/23 1204 06/05/23 1944 06/06/23 0448 06/06/23 1051  BP: (!) 94/54 (!) 145/61 (!) 109/58   Pulse: 73 72 87 70  Resp:  15 16   Temp:  97.9 F (36.6 C) 98.2 F (36.8 C)   TempSrc:      SpO2: 100% 99% 94% 95%    Intake/Output Summary (Last 24 hours) at 06/06/2023 1235 Last data filed at 06/06/2023 1004 Gross per 24 hour  Intake 4432.07 ml  Output 800 ml  Net 3632.07 ml   There were no vitals filed for this visit.  Examination:  Physical Exam: GEN: NAD, alert and oriented x 3, wd/wn HEENT: NCAT, PERRL, EOMI, sclera clear, MMM PULM: Breath sounds slight diminished bilateral bases, no wheezes/crackles, normal respiratory effort without accessory muscle use,  on room air with SpO2 99% at rest CV: RRR w/o M/G/R GI: abd soft, NTND, + BS MSK: no peripheral edema, muscle strength globally intact 5/5 bilateral upper/lower extremities NEURO: CN II-XII intact, no focal deficits, sensation to light touch intact PSYCH: normal mood/affect Integumentary: dry/intact, no rashes or wounds    Data Reviewed: I have personally reviewed following labs and imaging studies  CBC: Recent Labs  Lab 06/04/23 1009 06/05/23 0523 06/06/23 0535  WBC 7.1 12.3* 6.9  NEUTROABS 6.2  --   --   HGB 12.5* 10.2* 10.3*  HCT 38.0* 31.7* 31.9*  MCV 96.2 99.4 100.0  PLT 224 186 177   Basic Metabolic Panel: Recent Labs  Lab 06/04/23 1009 06/04/23 1439 06/05/23 0523 06/06/23 0535  NA 132*  --  135 134*  K 4.5  --  4.3 3.9  CL 100  --  108 108  CO2 21*  --  18* 17*  GLUCOSE 112*  --  69* 61*  BUN 38*  --  42* 28*  CREATININE 1.95*  --  2.11* 1.06  CALCIUM 8.2*  --  7.5* 7.6*  MG  --  2.0 1.9 2.1  PHOS  --   --   --  2.2*   GFR: CrCl cannot be calculated (Unknown ideal weight.). Liver Function Tests: Recent Labs  Lab 06/04/23 1009 06/05/23 0523 06/06/23 0535  AST 96* 79* 71*  ALT 49* 43 40  ALKPHOS  52 38 37*  BILITOT 0.6 0.8 0.6  PROT 6.3* 5.1* 4.8*  ALBUMIN 3.3* 2.4* 2.2*   No results for input(s): "LIPASE", "AMYLASE" in the last 168 hours. No results for input(s): "AMMONIA" in the last 168 hours. Coagulation Profile: No results for input(s): "INR", "PROTIME" in the last 168 hours. Cardiac Enzymes: No results for input(s): "CKTOTAL", "CKMB", "CKMBINDEX", "TROPONINI" in the last 168 hours. BNP (last 3 results) No results for input(s): "PROBNP" in the last 8760 hours. HbA1C: No results for input(s): "HGBA1C" in the last 72 hours. CBG: No results for input(s): "GLUCAP" in the last 168 hours. Lipid Profile: No results for input(s): "CHOL", "HDL", "LDLCALC", "TRIG", "CHOLHDL", "LDLDIRECT" in the last 72 hours. Thyroid Function Tests: No results  for input(s): "TSH", "T4TOTAL", "FREET4", "T3FREE", "THYROIDAB" in the last 72 hours. Anemia Panel: No results for input(s): "VITAMINB12", "FOLATE", "FERRITIN", "TIBC", "IRON", "RETICCTPCT" in the last 72 hours. Sepsis Labs: Recent Labs  Lab 06/04/23 1346 06/04/23 1439 06/06/23 0535  PROCALCITON  --  0.16 2.98  LATICACIDVEN 1.5  --   --     Recent Results (from the past 240 hours)  Resp panel by RT-PCR (RSV, Flu A&B, Covid) Anterior Nasal Swab     Status: Abnormal   Collection Time: 06/04/23 10:17 AM   Specimen: Anterior Nasal Swab  Result Value Ref Range Status   SARS Coronavirus 2 by RT PCR NEGATIVE NEGATIVE Final    Comment: (NOTE) SARS-CoV-2 target nucleic acids are NOT DETECTED.  The SARS-CoV-2 RNA is generally detectable in upper respiratory specimens during the acute phase of infection. The lowest concentration of SARS-CoV-2 viral copies this assay can detect is 138 copies/mL. A negative result does not preclude SARS-Cov-2 infection and should not be used as the sole basis for treatment or other patient management decisions. A negative result may occur with  improper specimen collection/handling, submission of specimen other than nasopharyngeal swab, presence of viral mutation(s) within the areas targeted by this assay, and inadequate number of viral copies(<138 copies/mL). A negative result must be combined with clinical observations, patient history, and epidemiological information. The expected result is Negative.  Fact Sheet for Patients:  BloggerCourse.com  Fact Sheet for Healthcare Providers:  SeriousBroker.it  This test is no t yet approved or cleared by the [place] FDA and  has been authorized for detection and/or diagnosis of SARS-CoV-2 by FDA under an Emergency Use Authorization (EUA). This EUA will remain  in effect (meaning this test can be used) for the duration of the COVID-19 declaration under  Section 564(b)(1) of the Act, 21 U.S.C.section 360bbb-3(b)(1), unless the authorization is terminated  or revoked sooner.       Influenza A by PCR POSITIVE (A) NEGATIVE Final   Influenza B by PCR NEGATIVE NEGATIVE Final    Comment: (NOTE) The Xpert Xpress SARS-CoV-2/FLU/RSV plus assay is intended as an aid in the diagnosis of influenza from Nasopharyngeal swab specimens and should not be used as a sole basis for treatment. Nasal washings and aspirates are unacceptable for Xpert Xpress SARS-CoV-2/FLU/RSV testing.  Fact Sheet for Patients: BloggerCourse.com  Fact Sheet for Healthcare Providers: SeriousBroker.it  This test is not yet approved or cleared by the [place] FDA and has been authorized for detection and/or diagnosis of SARS-CoV-2 by FDA under an Emergency Use Authorization (EUA). This EUA will remain in effect (meaning this test can be used) for the duration of the COVID-19 declaration under Section 564(b)(1) of the Act, 21 U.S.C. section 360bbb-3(b)(1), unless the authorization is  terminated or revoked.     Resp Syncytial Virus by PCR NEGATIVE NEGATIVE Final    Comment: (NOTE) Fact Sheet for Patients: BloggerCourse.com  Fact Sheet for Healthcare Providers: SeriousBroker.it  This test is not yet approved or cleared by the [place] FDA and has been authorized for detection and/or diagnosis of SARS-CoV-2 by FDA under an Emergency Use Authorization (EUA). This EUA will remain in effect (meaning this test can be used) for the duration of the COVID-19 declaration under Section 564(b)(1) of the Act, 21 U.S.C. section 360bbb-3(b)(1), unless the authorization is terminated or revoked.  Performed at [facility], [address] [place], [place] [address]   Culture, blood (routine x 2)     Status: Abnormal   Collection Time: 06/04/23  2:44 PM    Specimen: BLOOD  Result Value Ref Range Status   Specimen Description   Final    BLOOD RIGHT ANTECUBITAL Performed at [facility], [address], [place], [place] [address]    Special Requests   Final    BOTTLES DRAWN AEROBIC AND ANAEROBIC Blood Culture adequate volume Performed at [facility], [address] [place], [place] [address]    Culture  Setup Time   Final    GRAM POSITIVE COCCI IN CLUSTERS AEROBIC BOTTLE ONLY CRITICAL RESULT CALLED TO, READ BACK BY AND VERIFIED WITH: PHARMD SYDNEY DAVIS [id number] AT 1556 BY EC    Culture (A)  Final    STAPHYLOCOCCUS EPIDERMIDIS THE SIGNIFICANCE OF ISOLATING THIS ORGANISM FROM A SINGLE SET OF BLOOD CULTURES WHEN MULTIPLE SETS ARE DRAWN IS UNCERTAIN. PLEASE NOTIFY THE MICROBIOLOGY DEPARTMENT WITHIN ONE WEEK IF SPECIATION AND SENSITIVITIES ARE REQUIRED. Performed at [facility], [address] [place], [place] [address]    Report Status 06/06/2023 FINAL  Final  Blood Culture ID Panel (Reflexed)     Status: Abnormal   Collection Time: 06/04/23  2:44 PM  Result Value Ref Range Status   Enterococcus faecalis NOT DETECTED NOT DETECTED Final   Enterococcus Faecium NOT DETECTED NOT DETECTED Final   Listeria monocytogenes NOT DETECTED NOT DETECTED Final   Staphylococcus species DETECTED (A) NOT DETECTED Final    Comment: CRITICAL RESULT CALLED TO, READ BACK BY AND VERIFIED WITH: PHARMD SYDNEY DAVIS [id number] AT 1556Y EC    Staphylococcus aureus (BCID) NOT DETECTED NOT DETECTED Final   Staphylococcus epidermidis DETECTED (A) NOT DETECTED Final    Comment: Methicillin (oxacillin) resistant coagulase negative staphylococcus. Possible blood culture contaminant (unless isolated from more than one blood culture draw or clinical case suggests pathogenicity). No antibiotic treatment is indicated for blood  culture contaminants. CRITICAL RESULT CALLED TO, READ BACK BY AND VERIFIED WITH: PHARMD SYDNEY  DAVIS [id number] AT 1556 BY EC    Staphylococcus lugdunensis NOT DETECTED NOT DETECTED Final   Streptococcus species NOT DETECTED NOT DETECTED Final   Streptococcus agalactiae NOT DETECTED NOT DETECTED Final   Streptococcus pneumoniae NOT DETECTED NOT DETECTED Final   Streptococcus pyogenes NOT DETECTED NOT DETECTED Final   A.calcoaceticus-baumannii NOT DETECTED NOT DETECTED Final   Bacteroides fragilis NOT DETECTED NOT DETECTED Final   Enterobacterales NOT DETECTED NOT DETECTED Final   Enterobacter cloacae complex NOT DETECTED NOT DETECTED Final   Escherichia coli NOT DETECTED NOT DETECTED Final   Klebsiella aerogenes NOT DETECTED NOT DETECTED Final   Klebsiella oxytoca NOT DETECTED NOT DETECTED Final   Klebsiella pneumoniae NOT DETECTED NOT DETECTED Final   Proteus species NOT DETECTED NOT DETECTED Final  Salmonella species NOT DETECTED NOT DETECTED Final   Serratia marcescens NOT DETECTED NOT DETECTED Final   Haemophilus influenzae NOT DETECTED NOT DETECTED Final   Neisseria meningitidis NOT DETECTED NOT DETECTED Final   Pseudomonas aeruginosa NOT DETECTED NOT DETECTED Final   Stenotrophomonas maltophilia NOT DETECTED NOT DETECTED Final   Candida albicans NOT DETECTED NOT DETECTED Final   Candida auris NOT DETECTED NOT DETECTED Final   Candida glabrata NOT DETECTED NOT DETECTED Final   Candida krusei NOT DETECTED NOT DETECTED Final   Candida parapsilosis NOT DETECTED NOT DETECTED Final   Candida tropicalis NOT DETECTED NOT DETECTED Final   Cryptococcus neoformans/gattii NOT DETECTED NOT DETECTED Final   Methicillin resistance mecA/C DETECTED (A) NOT DETECTED Final    Comment: CRITICAL RESULT CALLED TO, READ BACK BY AND VERIFIED WITH: PHARMD SYDNEY DAVIS [id number] AT 1556 BY EC Performed at [facility], [address] [place], [place] [address]   Culture, blood (routine x 2)     Status: None (Preliminary result)   Collection Time: 06/04/23  2:47 PM   Specimen: BLOOD   Result Value Ref Range Status   Specimen Description   Final    BLOOD LEFT ANTECUBITAL Performed at [facility], [address] [place], [place] [address]    Special Requests   Final    BOTTLES DRAWN AEROBIC AND ANAEROBIC Blood Culture results may not be optimal due to an inadequate volume of blood received in culture bottles Performed at [facility], [address], [place], [place] [address]    Culture   Final    NO GROWTH 2 DAYS Performed at [facility], [address] [place], [place] [address]    Report Status PENDING  Incomplete         Radiology Studies: DG Swallowing Func-Speech Pathology Result Date: 06/05/2023 Table formatting from the original result was not included. Modified Barium Swallow Study Patient Details Name: David Mcgrath MRN: [id number] Date of Birth: 1950/11/02 Today's Date: 06/05/2023 HPI/PMH: HPI: Patient is a 73 y.o. male with PMH: GERD, chronic idiopathic neuropathy, HTN, HLD, anxiety, depression. He presented to [facility] ED with 6-7 day h/o increased dyspnea, fatigue, poor PO intake, right sided abdominal pain, nausea, vomiting, dairrhea and ongoing dry cough since June of 2024. In Ed he was afebrile, HR 40, RR 23, SpO2 89% on RA, improved to 94% on 2-3L oxygen via nasal cannula. CXR showed predominant interstitial coarsening suspicious for viral Mycoplasma pneumonia.  CTA PE obtained and negative for pulmonary embolus does show airspace opacities throughout the bilateral lung bases with as well as esophagus that is thickened and partially fluid filled suggestive of reflux and aspiration. CT abd/pelvis obtained and shows multiple loopss of gas and fluid distended small bowel consistent with ileus, no transition point. 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 Patient is NPO, awaiting SLP evaluation of swallow function. Clinical Impression:  Clinical Impression: Patient presents with a primary pharyngoesophageal dysphagia as per this MBS. Prominent cricopharyngeal bar observed (no radiologist present to confirm). No penetration or aspiration of any of the tested consistencies was observed during any phase of the swallow. Swallow intiation was delayed at level of vallecular sinus with puree solids, honey thick liquids, posterior laryngeal surface of the epiglottis and at times, pyriform sinus with nectar thick and thin liquids. Anterior hyoid excursion, laryngeal elevation and epiglottic inversion were all complete. Laryngeal vestibular closure  was incomplete. Trace to mild residuals remained in vallecular sinus, posterior pharyngeal wall and pyriform sinus with solids and liquids, however cleared with subsequent swallows. When swallowing 13mm barium tablet with thin liquid barium, patient did have difficulty with bolus cohesion. Prior to tablet transiting with swallow of thin liquid, patient had an instance of gagging versus feeling of regurgitation which resulted in discoordination of swallow. Tablet was seen falling into pharynx and resting on PES in absence of an active swallow response. Barium tablet then transited through PES and esophagus. Barium tablet slowed distally and distal retrograde movement of barium and barium stasis observed. SLP is recommending advance to regular solids, thin liquids when cleared by GI. No further skilled SLP services needed at this time. Factors that may increase risk of adverse event in presence of aspiration Rubye Oaks & Clearance Coots 2021): Factors that may increase risk of adverse event in presence of aspiration Rubye Oaks & Clearance Coots 2021): Respiratory or GI disease Recommendations/Plan: Swallowing Evaluation Recommendations Swallowing Evaluation Recommendations Recommendations: PO diet PO Diet Recommendation: Regular; Thin liquids (Level 0) Liquid Administration via: Cup; Straw Medication Administration: Whole meds with puree  Supervision: Patient able to self-feed Swallowing strategies  : Slow rate; Small bites/sips Postural changes: Position pt fully upright for meals; Stay upright 30-60 min after meals Oral care recommendations: Oral care BID (2x/day) Treatment Plan Treatment Plan Treatment recommendations: No treatment recommended at this time Follow-up recommendations: No SLP follow up Functional status assessment: Patient has had a recent decline in their functional status and demonstrates the ability to make significant improvements in function in a reasonable and predictable amount of time. Recommendations Recommendations for follow up therapy are one component of a multi-disciplinary discharge planning process, led by the attending physician.  Recommendations may be updated based on patient status, additional functional criteria and insurance authorization. Assessment: Orofacial Exam: Orofacial Exam Oral Cavity - Dentition: Adequate natural dentition Anatomy: Anatomy: Prominent cricopharyngeus Boluses Administered: Boluses Administered Boluses Administered: Thin liquids (Level 0); Mildly thick liquids (Level 2, nectar thick); Moderately thick liquids (Level 3, honey thick); Puree  Oral Impairment Domain: Oral Impairment Domain Lip Closure: No labial escape Tongue control during bolus hold: Not tested Bolus transport/lingual motion: Brisk tongue motion Oral residue: Trace residue lining oral structures Location of oral residue : Tongue Initiation of pharyngeal swallow : Posterior laryngeal surface of the epiglottis; Pyriform sinuses  Pharyngeal Impairment Domain: Pharyngeal Impairment Domain Soft palate elevation: No bolus between soft palate (SP)/pharyngeal wall (PW) Laryngeal elevation: Complete superior movement of thyroid cartilage with complete approximation of arytenoids to epiglottic petiole Anterior hyoid excursion: Complete anterior movement Epiglottic movement: Complete inversion Laryngeal vestibule closure: Incomplete,  narrow column air/contrast in laryngeal vestibule Pharyngeal stripping wave : Present - complete Pharyngeal contraction (A/P view only): N/A Pharyngoesophageal segment opening: Partial distention/partial duration, partial obstruction of flow Tongue base retraction: No contrast between tongue base and posterior pharyngeal wall (PPW) Pharyngeal residue: Collection of residue within or on pharyngeal structures Location of pharyngeal residue: Pharyngeal wall; Valleculae; Pyriform sinuses; Tongue base  Esophageal Impairment Domain: Esophageal Impairment Domain Esophageal clearance upright position: Complete clearance, esophageal coating Pill: Pill Consistency administered: Thin liquids (Level 0) Thin liquids (Level 0): Impaired (see clinical impressions) Penetration/Aspiration Scale Score: Penetration/Aspiration Scale Score 1.  Material does not enter airway: Thin liquids (Level 0); Mildly thick liquids (Level 2, nectar thick); Moderately thick liquids (Level 3, honey thick); Puree; Pill Compensatory Strategies: Compensatory Strategies Compensatory strategies: No   General Information: No data recorded Diet Prior to this Study: Thin liquids (  Level 0)   No data recorded  Respiratory Status: WFL   Supplemental O2: Nasal cannula   History of Recent Intubation: No  Behavior/Cognition: Alert; Cooperative; Pleasant mood Self-Feeding Abilities: Able to self-feed Baseline vocal quality/speech: Normal Volitional Cough: Able to elicit Volitional Swallow: Able to elicit Exam Limitations: No limitations Goal Planning: Prognosis for improved oropharyngeal function: Good No data recorded No data recorded Patient/Family Stated Goal: mouth is dry, would like some oral fluids Consulted and agree with results and recommendations: Patient Pain: Pain Assessment Pain Assessment: No/denies pain End of Session: Start Time:SLP Start Time (ACUTE ONLY): 1240 Stop Time: SLP Stop Time (ACUTE ONLY): 1255 Time Calculation:SLP Time Calculation (min)  (ACUTE ONLY): 15 min Charges: SLP Evaluations $ SLP Speech Visit: 1 Visit SLP Evaluations $BSS Swallow: 1 Procedure $MBS Swallow: 1 Procedure SLP visit diagnosis: SLP Visit Diagnosis: Dysphagia, pharyngoesophageal phase (R13.14) Past Medical History: Past Medical History: Diagnosis Date  Neuropathy  Past Surgical History: No past surgical history on file. Angela Nevin, MA, CCC-SLP Speech Therapy        Scheduled Meds:  [START ON 06/07/2023] azithromycin  500 mg Oral Daily   enoxaparin (LOVENOX) injection  40 mg Subcutaneous QHS   feeding supplement  1 Container Oral TID BM   FLUoxetine  20 mg Oral BID   gabapentin  600 mg Oral BID   guaiFENesin  600 mg Oral BID   pantoprazole  40 mg Oral Daily   Continuous Infusions:  sodium chloride 100 mL/hr at 06/06/23 0842   ampicillin-sulbactam (UNASYN) IV Stopped (06/06/23 1000)     LOS: 2 days    Time spent: 52 minutes spent on chart review, discussion with nursing staff, consultants, updating family and interview/physical exam; more than 50% of that time was spent in counseling and/or coordination of care.    Alvira Philips [place], DO [facility] Available via Epic secure chat 7am-7pm After these hours, please refer to coverage provider listed on [email or web address] 06/06/2023, 12:35 PM

## 2023-06-06 NOTE — Plan of Care (Signed)
  Problem: Education: Goal: Knowledge of General Education information will improve Description: Including pain rating scale, medication(s)/side effects and non-pharmacologic comfort measures Outcome: Progressing   Problem: Health Behavior/Discharge Planning: Goal: Ability to manage health-related needs will improve Outcome: Progressing   Problem: Clinical Measurements: Goal: Respiratory complications will improve Outcome: Progressing   Problem: Nutrition: Goal: Adequate nutrition will be maintained Outcome: Progressing   Problem: Coping: Goal: Level of anxiety will decrease Outcome: Progressing   Problem: Elimination: Goal: Will not experience complications related to bowel motility Outcome: Progressing

## 2023-06-06 NOTE — Progress Notes (Signed)
 Patient complaint of a dull stomach pain (RLQ), O2 sats 94% on RA, bowel sounds audible on auscultation. Ate full liquid for breakfast and wants to try full liquid for lunch before advancing to soft diet.

## 2023-06-07 DIAGNOSIS — J09X1 Influenza due to identified novel influenza A virus with pneumonia: Secondary | ICD-10-CM | POA: Diagnosis not present

## 2023-06-07 DIAGNOSIS — J690 Pneumonitis due to inhalation of food and vomit: Secondary | ICD-10-CM | POA: Diagnosis not present

## 2023-06-07 LAB — CBC
HCT: 32.7 % — ABNORMAL LOW (ref 39.0–52.0)
Hemoglobin: 10.5 g/dL — ABNORMAL LOW (ref 13.0–17.0)
MCH: 32 pg (ref 26.0–34.0)
MCHC: 32.1 g/dL (ref 30.0–36.0)
MCV: 99.7 fL (ref 80.0–100.0)
Platelets: 194 10*3/uL (ref 150–400)
RBC: 3.28 MIL/uL — ABNORMAL LOW (ref 4.22–5.81)
RDW: 14 % (ref 11.5–15.5)
WBC: 7.7 10*3/uL (ref 4.0–10.5)
nRBC: 0 % (ref 0.0–0.2)

## 2023-06-07 LAB — COMPREHENSIVE METABOLIC PANEL
ALT: 41 U/L (ref 0–44)
AST: 59 U/L — ABNORMAL HIGH (ref 15–41)
Albumin: 2.2 g/dL — ABNORMAL LOW (ref 3.5–5.0)
Alkaline Phosphatase: 40 U/L (ref 38–126)
Anion gap: 8 (ref 5–15)
BUN: 19 mg/dL (ref 8–23)
CO2: 18 mmol/L — ABNORMAL LOW (ref 22–32)
Calcium: 7.9 mg/dL — ABNORMAL LOW (ref 8.9–10.3)
Chloride: 109 mmol/L (ref 98–111)
Creatinine, Ser: 0.85 mg/dL (ref 0.61–1.24)
GFR, Estimated: >60 mL/min (ref >60)
Glucose, Bld: 105 mg/dL — ABNORMAL HIGH (ref 70–99)
Potassium: 3.8 mmol/L (ref 3.5–5.1)
Sodium: 135 mmol/L (ref 135–145)
Total Bilirubin: 0.5 mg/dL (ref 0.0–1.2)
Total Protein: 5 g/dL — ABNORMAL LOW (ref 6.5–8.1)

## 2023-06-07 MED ORDER — AMOXICILLIN-POT CLAVULANATE 875-125 MG PO TABS
1.0000 | ORAL_TABLET | Freq: Two times a day (BID) | ORAL | Status: DC
  Administered 2023-06-07 (×2): 1 via ORAL
  Filled 2023-06-07 (×2): qty 1

## 2023-06-07 NOTE — Progress Notes (Signed)
 PROGRESS NOTE    David Mcgrath  ZOX:[id number] DOB: 07-25-1950 DOA: 06/04/2023 PCP: Charlane Ferretti, DO    Brief Narrative:   David Mcgrath is a 73 y.o. male with past medical history significant for basal cell carcinoma, HTN, HLD, neuropathy, gout, anxiety/depression, GERD who presented to [facility], ED on 06/04/2023 with 6S 7-day history of progressive shortness of breath, fatigue, poor oral intake, right-sided abdominal pain, nausea/vomiting/diarrhea. He was seen by his PCP and most recently Pulmonology in Jan 2025 for his cough. Symptoms were attributed to bronchitis and patient has been given steroids and antibiotics, he reports have these have not helped. Denies chest pain, palpitations, fevers, chills, or known sick contacts.    In the ED, temperature 37.1 C, BP 141/56, HR 40, RR 23, SpO2 89% on room air and later 94% on 2 to 3 L via nasal cannula. WBC 7.1, hemoglobin 12.5, platelet count 224.  Sodium 132, CO2 21, glucose 112, BUN 38, creatinine 1.95, calcium 8.2, total protein 6.3, bili 3.3, AST 96, ALT 49, respiratory panel positive for influenza A, and lactic acid 1.5.  CXR obtained and shows predominant interstitial coarsening suspicious for viral Mycoplasma pneumonia.  CTA PE obtained and negative for pulmonary embolus does show airspace opacities throughout the bilateral lung bases with as well as esophagus that is thickened and partially fluid filled suggestive of reflux and aspiration. CT abd/pelvis obtained and shows multiple loopss of gas and fluid distended small bowel consistent with ileus, no transition point. 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 EDP curbsided Dr Marchelle Gearing patients outpatient pulmonologist who recommended empirically covering with Unasyn and Zithromax. Patient was given Unasyn, Zithromax, 1L NS bolus, and Duoneb. TRH contacted for admission.  Assessment & Plan:   Acute  Hypoxic Respiratory Failure Influenza A Aspiration Pneumonia Patient presenting to the ED with progressive shortness of breath, fever, fatigue as well as nausea/vomiting/diarrhea.  CT angiogram chest negative for PE but with airspace opacities throughout the bilateral lung bases with partially fluid-filled esophagus concerning for aspiration/reflux.  Given patient's symptoms began greater than 48 hours prior to ED presentation, outside of window for Tamiflu. -- Azithromycin 500 mg PO q24h -- Augmentin 875-125 mg p.o. twice daily -- Continue supplemental oxygen, maintain SpO2 greater than 92%; titrated off this morning -- Tessalon/Hycodan as needed cough -- Incentive spirometry, flutter valve  Ileus: resolved CT Abdomen suggestive of ileus vs early SBO. No history of small bowel obstruction or abdominal surgeries.  -- advance to soft diet -- NS at 100 mL/hr -- Zofran 4mg  IV q6h PRN    Acute Kidney Injury Patient presenting with a elevated creatinine of 1.95.  No baseline available for review in EMR or Care Everywhere. -- Cr 1.95>2.11>1.06>0.85 -- continue IVF hydration w/ NS at 100 mL/h -- holding home olmesartan -- Avoid nephrotoxins, renal dose all medications -- BMP daily  Hepatic Steatosis Elevated LFTs -- AST 96>79>59 -- ALT 49>43>41 -- Tbili 0.6>0.8>0.5 -- Hold home statin -- CMP daily   Hyperlipidemia -- Holding  home statin as above   Diarrhea: Resolved Likely secondary to influenza A viral infection.  No further diarrhea, will discontinue C. difficile/GI pathogen panel and enteric precautions.   Hypertension -- Hold home Olmesartan -- PRN hydralazine  Anxiety/depression: -- Prozac 20 mg p.o. twice daily  Neuropathy -- Gabapentin 600 mg p.o. 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  Recent colonoscopy with no concerning findings per patient.  CT chest, abdomen/pelvis with no  findings suggestive of primary malignancy.  Alkaline phosphatase within normal limits.  Unclear etiology of osseous lesions.  Would likely benefit from outpatient follow-up with PCP/oncology and would likely need outpatient biopsy, PET scan for further workup.  Weakness/debility/deconditioning: -- PT evaluation   DVT prophylaxis: enoxaparin (LOVENOX) injection 40 mg Start: 06/04/23 2200    Code Status: Full Code Family Communication: Updated spouse over telephone this morning in patient's room  Disposition Plan:  Level of care: Med-Surg Status is: Inpatient Remains inpatient appropriate because: Transition IV antibiotics to p.o., if tolerates anticipate discharge home tomorrow    Consultants:  None  Procedures:  None  Antimicrobials:  Azithromycin 3/2>> Unasyn 3/2 - 3/4 Augmentin 3/5>>   Subjective: Patient seen examined at bedside, lying in bed.  Spouse on speaker phone.  Tolerating advance to soft diet, but with poor appetite.  Remains off of supplemental oxygen.  Continues with nonproductive cough.  Also complains of generalized weakness/fatigue.  Discussed will transition IV Unasyn to Augmentin today.  If tolerates likely discharge home tomorrow.  No other specific questions, concerns or complaints at this time.  Denies headache, no dizziness, no chest pain, no palpitations, no shortness of breath, no abdominal pain, no chills/night sweats, no current nausea/vomiting, no abdominal pain, no focal weakness, no fatigue, no paresthesia.  No acute events overnight per nursing.  Objective: Vitals:   06/06/23 1415 06/06/23 1508 06/06/23 2049 06/07/23 0443  BP:   121/63 118/60  Pulse:   78 (!) 57  Resp:   16 16  Temp:   98.2 F (36.8 C) 98.2 F (36.8 C)  TempSrc:      SpO2: 93% 98% 93% 91%    Intake/Output Summary (Last 24 hours) at 06/07/2023 1046 Last data filed at 06/07/2023 0600 Gross per 24 hour  Intake 2795.36 ml  Output 1050 ml  Net 1745.36 ml   There were no vitals  filed for this visit.  Examination:  Physical Exam: GEN: NAD, alert and oriented x 3, wd/wn HEENT: NCAT, PERRL, EOMI, sclera clear, MMM PULM: Breath sounds slight diminished bilateral bases, no wheezes/crackles, normal respiratory effort without accessory muscle use, on room air with SpO2 91% at rest CV: RRR w/o M/G/R GI: abd soft, NTND, + BS MSK: no peripheral edema, muscle strength globally intact 5/5 bilateral upper/lower extremities NEURO: CN II-XII intact, no focal deficits, sensation to light touch intact PSYCH: normal mood/affect Integumentary: dry/intact, no rashes or wounds    Data Reviewed: I have personally reviewed following labs and imaging studies  CBC: Recent Labs  Lab 06/04/23 1009 06/05/23 0523 06/06/23 0535 06/07/23 0533  WBC 7.1 12.3* 6.9 7.7  NEUTROABS 6.2  --   --   --   HGB 12.5* 10.2* 10.3* 10.5*  HCT 38.0* 31.7* 31.9* 32.7*  MCV 96.2 99.4 100.0 99.7  PLT 224 186 177 194   Basic Metabolic Panel: Recent Labs  Lab 06/04/23 1009 06/04/23 1439 06/05/23 0523 06/06/23 0535 06/07/23 0533  NA 132*  --  135 134* 135  K 4.5  --  4.3 3.9 3.8  CL 100  --  108 108 109  CO2 21*  --  18* 17* 18*  GLUCOSE 112*  --  69* 61* 105*  BUN 38*  --  42* 28* 19  CREATININE 1.95*  --  2.11* 1.06 0.85  CALCIUM 8.2*  --  7.5* 7.6* 7.9*  MG  --  2.0 1.9 2.1  --   PHOS  --   --   --  2.2*  --    GFR: CrCl cannot be calculated (Unknown ideal weight.). Liver Function Tests: Recent Labs  Lab 06/04/23 1009 06/05/23 0523 06/06/23 0535 06/07/23 0533  AST 96* 79* 71* 59*  ALT 49* 43 40 41  ALKPHOS 52 38 37* 40  BILITOT 0.6 0.8 0.6 0.5  PROT 6.3* 5.1* 4.8* 5.0*  ALBUMIN 3.3* 2.4* 2.2* 2.2*   No results for input(s): "LIPASE", "AMYLASE" in the last 168 hours. No results for input(s): "AMMONIA" in the last 168 hours. Coagulation Profile: No results for input(s): "INR", "PROTIME" in the last 168 hours. Cardiac Enzymes: No results for input(s): "CKTOTAL", "CKMB",  "CKMBINDEX", "TROPONINI" in the last 168 hours. BNP (last 3 results) No results for input(s): "PROBNP" in the last 8760 hours. HbA1C: No results for input(s): "HGBA1C" in the last 72 hours. CBG: No results for input(s): "GLUCAP" in the last 168 hours. Lipid Profile: No results for input(s): "CHOL", "HDL", "LDLCALC", "TRIG", "CHOLHDL", "LDLDIRECT" in the last 72 hours. Thyroid Function Tests: No results for input(s): "TSH", "T4TOTAL", "FREET4", "T3FREE", "THYROIDAB" in the last 72 hours. Anemia Panel: No results for input(s): "VITAMINB12", "FOLATE", "FERRITIN", "TIBC", "IRON", "RETICCTPCT" in the last 72 hours. Sepsis Labs: Recent Labs  Lab 06/04/23 1346 06/04/23 1439 06/06/23 0535  PROCALCITON  --  0.16 2.98  LATICACIDVEN 1.5  --   --     Recent Results (from the past 240 hours)  Resp panel by RT-PCR (RSV, Flu A&B, Covid) Anterior Nasal Swab     Status: Abnormal   Collection Time: 06/04/23 10:17 AM   Specimen: Anterior Nasal Swab  Result Value Ref Range Status   SARS Coronavirus 2 by RT PCR NEGATIVE NEGATIVE Final    Comment: (NOTE) SARS-CoV-2 target nucleic acids are NOT DETECTED.  The SARS-CoV-2 RNA is generally detectable in upper respiratory specimens during the acute phase of infection. The lowest concentration of SARS-CoV-2 viral copies this assay can detect is 138 copies/mL. A negative result does not preclude SARS-Cov-2 infection and should not be used as the sole basis for treatment or other patient management decisions. A negative result may occur with  improper specimen collection/handling, submission of specimen other than nasopharyngeal swab, presence of viral mutation(s) within the areas targeted by this assay, and inadequate number of viral copies(<138 copies/mL). A negative result must be combined with clinical observations, patient history, and epidemiological information. The expected result is Negative.  Fact Sheet for Patients:   BloggerCourse.com  Fact Sheet for Healthcare Providers:  SeriousBroker.it  This test is no t yet approved or cleared by the [place] FDA and  has been authorized for detection and/or diagnosis of SARS-CoV-2 by FDA under an Emergency Use Authorization (EUA). This EUA will remain  in effect (meaning this test can be used) for the duration of the COVID-19 declaration under Section 564(b)(1) of the Act, 21 U.S.C.section 360bbb-3(b)(1), unless the authorization is terminated  or revoked sooner.       Influenza A by PCR POSITIVE (A) NEGATIVE Final   Influenza B by PCR NEGATIVE NEGATIVE Final    Comment: (NOTE) The Xpert Xpress SARS-CoV-2/FLU/RSV plus assay is intended as an aid in the diagnosis of influenza from Nasopharyngeal swab specimens and should not be used as a sole basis for treatment. Nasal washings and aspirates are unacceptable for Xpert Xpress SARS-CoV-2/FLU/RSV testing.  Fact Sheet for Patients: BloggerCourse.com  Fact Sheet for  Healthcare Providers: SeriousBroker.it  This test is not yet approved or cleared by the [place] and has been authorized for detection and/or diagnosis of SARS-CoV-2 by FDA under an Emergency Use Authorization (EUA). This EUA will remain in effect (meaning this test can be used) for the duration of the COVID-19 declaration under Section 564(b)(1) of the Act, 21 U.S.C. section 360bbb-3(b)(1), unless the authorization is terminated or revoked.     Resp Syncytial Virus by PCR NEGATIVE NEGATIVE Final    Comment: (NOTE) Fact Sheet for Patients: BloggerCourse.com  Fact Sheet for Healthcare Providers: SeriousBroker.it  This test is not yet approved or cleared by the [place] FDA and has been authorized for detection and/or diagnosis of SARS-CoV-2 by FDA under an Emergency Use  Authorization (EUA). This EUA will remain in effect (meaning this test can be used) for the duration of the COVID-19 declaration under Section 564(b)(1) of the Act, 21 U.S.C. section 360bbb-3(b)(1), unless the authorization is terminated or revoked.  Performed at [facility], [address], [place], [place] [address]   Culture, blood (routine x 2)     Status: Abnormal   Collection Time: 06/04/23  2:44 PM   Specimen: BLOOD  Result Value Ref Range Status   Specimen Description   Final    BLOOD RIGHT ANTECUBITAL Performed at [facility], [address] [place], [place] [address]    Special Requests   Final    BOTTLES DRAWN AEROBIC AND ANAEROBIC Blood Culture adequate volume Performed at [facility], [address] [place], [place] [address]    Culture  Setup Time   Final    GRAM POSITIVE COCCI IN CLUSTERS AEROBIC BOTTLE ONLY CRITICAL RESULT CALLED TO, READ BACK BY AND VERIFIED WITH: PHARMD SYDNEY DAVIS [id number] AT 1556 BY EC    Culture (A)  Final    STAPHYLOCOCCUS EPIDERMIDIS THE SIGNIFICANCE OF ISOLATING THIS ORGANISM FROM A SINGLE SET OF BLOOD CULTURES WHEN MULTIPLE SETS ARE DRAWN IS UNCERTAIN. PLEASE NOTIFY THE MICROBIOLOGY DEPARTMENT WITHIN ONE WEEK IF SPECIATION AND SENSITIVITIES ARE REQUIRED. Performed at [facility], [address], [place], [place] [address]    Report Status 06/06/2023 FINAL  Final  Blood Culture ID Panel (Reflexed)     Status: Abnormal   Collection Time: 06/04/23  2:44 PM  Result Value Ref Range Status   Enterococcus faecalis NOT DETECTED NOT DETECTED Final   Enterococcus Faecium NOT DETECTED NOT DETECTED Final   Listeria monocytogenes NOT DETECTED NOT DETECTED Final   Staphylococcus species DETECTED (A) NOT DETECTED Final    Comment: CRITICAL RESULT CALLED TO, READ BACK BY AND VERIFIED WITH: PHARMD SYDNEY DAVIS [id number] AT 1556Y EC    Staphylococcus aureus (BCID) NOT DETECTED NOT  DETECTED Final   Staphylococcus epidermidis DETECTED (A) NOT DETECTED Final    Comment: Methicillin (oxacillin) resistant coagulase negative staphylococcus. Possible blood culture contaminant (unless isolated from more than one blood culture draw or clinical case suggests pathogenicity). No antibiotic treatment is indicated for blood  culture contaminants. CRITICAL RESULT CALLED TO, READ BACK BY AND VERIFIED WITH: PHARMD SYDNEY DAVIS [id number] AT 1556 BY EC    Staphylococcus lugdunensis NOT DETECTED NOT DETECTED Final   Streptococcus species NOT DETECTED NOT DETECTED Final   Streptococcus agalactiae NOT DETECTED NOT DETECTED Final   Streptococcus pneumoniae NOT DETECTED NOT DETECTED Final   Streptococcus pyogenes NOT DETECTED NOT DETECTED Final   A.calcoaceticus-baumannii NOT DETECTED NOT DETECTED Final   Bacteroides fragilis NOT DETECTED  NOT DETECTED Final   Enterobacterales NOT DETECTED NOT DETECTED Final   Enterobacter cloacae complex NOT DETECTED NOT DETECTED Final   Escherichia coli NOT DETECTED NOT DETECTED Final   Klebsiella aerogenes NOT DETECTED NOT DETECTED Final   Klebsiella oxytoca NOT DETECTED NOT DETECTED Final   Klebsiella pneumoniae NOT DETECTED NOT DETECTED Final   Proteus species NOT DETECTED NOT DETECTED Final   Salmonella species NOT DETECTED NOT DETECTED Final   Serratia marcescens NOT DETECTED NOT DETECTED Final   Haemophilus influenzae NOT DETECTED NOT DETECTED Final   Neisseria meningitidis NOT DETECTED NOT DETECTED Final   Pseudomonas aeruginosa NOT DETECTED NOT DETECTED Final   Stenotrophomonas maltophilia NOT DETECTED NOT DETECTED Final   Candida albicans NOT DETECTED NOT DETECTED Final   Candida auris NOT DETECTED NOT DETECTED Final   Candida glabrata NOT DETECTED NOT DETECTED Final   Candida krusei NOT DETECTED NOT DETECTED Final   Candida parapsilosis NOT DETECTED NOT DETECTED Final   Candida tropicalis NOT DETECTED NOT DETECTED Final   Cryptococcus  neoformans/gattii NOT DETECTED NOT DETECTED Final   Methicillin resistance mecA/C DETECTED (A) NOT DETECTED Final    Comment: CRITICAL RESULT CALLED TO, READ BACK BY AND VERIFIED WITH: PHARMD SYDNEY DAVIS [id number] AT 1556 BY EC Performed at [facility], [address] [place], [place] [address]   Culture, blood (routine x 2)     Status: None (Preliminary result)   Collection Time: 06/04/23  2:47 PM   Specimen: BLOOD  Result Value Ref Range Status   Specimen Description   Final    BLOOD LEFT ANTECUBITAL Performed at [facility], [address] [place], [place] [address]    Special Requests   Final    BOTTLES DRAWN AEROBIC AND ANAEROBIC Blood Culture results may not be optimal due to an inadequate volume of blood received in culture bottles Performed at [facility], [address], [place], [place] [address]    Culture   Final    NO GROWTH 3 DAYS Performed at [facility], [address], [place], [place] [address]    Report Status PENDING  Incomplete         Radiology Studies: DG Swallowing Func-Speech Pathology Result Date: 06/05/2023 Table formatting from the original result was not included. Modified Barium Swallow Study Patient Details Name: Orva Gwaltney MRN: [id number] Date of Birth: 02/26/1951 Today's Date: 06/05/2023 HPI/PMH: HPI: Patient is a 73 y.o. male with PMH: GERD, chronic idiopathic neuropathy, HTN, HLD, anxiety, depression. He presented to [facility] ED with 6-7 day h/o increased dyspnea, fatigue, poor PO intake, right sided abdominal pain, nausea, vomiting, dairrhea and ongoing dry cough since June of 2024. In Ed he was afebrile, HR 40, RR 23, SpO2 89% on RA, improved to 94% on 2-3L oxygen via nasal cannula. CXR showed predominant interstitial coarsening suspicious for viral Mycoplasma pneumonia.  CTA PE obtained and negative for pulmonary embolus does show airspace opacities throughout the bilateral lung bases with as  well as esophagus that is thickened and partially fluid filled suggestive of reflux and aspiration. CT abd/pelvis obtained and shows multiple loopss of gas and fluid distended small bowel consistent with ileus, no transition point. 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 Patient is NPO, awaiting SLP evaluation of swallow function. Clinical Impression: Clinical Impression: Patient presents with a primary pharyngoesophageal dysphagia as per this MBS. Prominent cricopharyngeal bar observed (no  radiologist present to confirm). No penetration or aspiration of any of the tested consistencies was observed during any phase of the swallow. Swallow intiation was delayed at level of vallecular sinus with puree solids, honey thick liquids, posterior laryngeal surface of the epiglottis and at times, pyriform sinus with nectar thick and thin liquids. Anterior hyoid excursion, laryngeal elevation and epiglottic inversion were all complete. Laryngeal vestibular closure was incomplete. Trace to mild residuals remained in vallecular sinus, posterior pharyngeal wall and pyriform sinus with solids and liquids, however cleared with subsequent swallows. When swallowing 13mm barium tablet with thin liquid barium, patient did have difficulty with bolus cohesion. Prior to tablet transiting with swallow of thin liquid, patient had an instance of gagging versus feeling of regurgitation which resulted in discoordination of swallow. Tablet was seen falling into pharynx and resting on PES in absence of an active swallow response. Barium tablet then transited through PES and esophagus. Barium tablet slowed distally and distal retrograde movement of barium and barium stasis observed. SLP is recommending advance to regular solids, thin liquids when cleared by GI. No further skilled SLP services needed at this time. Factors that may increase risk of adverse event in  presence of aspiration Rubye Oaks & Clearance Coots 2021): Factors that may increase risk of adverse event in presence of aspiration Rubye Oaks & Clearance Coots 2021): Respiratory or GI disease Recommendations/Plan: Swallowing Evaluation Recommendations Swallowing Evaluation Recommendations Recommendations: PO diet PO Diet Recommendation: Regular; Thin liquids (Level 0) Liquid Administration via: Cup; Straw Medication Administration: Whole meds with puree Supervision: Patient able to self-feed Swallowing strategies  : Slow rate; Small bites/sips Postural changes: Position pt fully upright for meals; Stay upright 30-60 min after meals Oral care recommendations: Oral care BID (2x/day) Treatment Plan Treatment Plan Treatment recommendations: No treatment recommended at this time Follow-up recommendations: No SLP follow up Functional status assessment: Patient has had a recent decline in their functional status and demonstrates the ability to make significant improvements in function in a reasonable and predictable amount of time. Recommendations Recommendations for follow up therapy are one component of a multi-disciplinary discharge planning process, led by the attending physician.  Recommendations may be updated based on patient status, additional functional criteria and insurance authorization. Assessment: Orofacial Exam: Orofacial Exam Oral Cavity - Dentition: Adequate natural dentition Anatomy: Anatomy: Prominent cricopharyngeus Boluses Administered: Boluses Administered Boluses Administered: Thin liquids (Level 0); Mildly thick liquids (Level 2, nectar thick); Moderately thick liquids (Level 3, honey thick); Puree  Oral Impairment Domain: Oral Impairment Domain Lip Closure: No labial escape Tongue control during bolus hold: Not tested Bolus transport/lingual motion: Brisk tongue motion Oral residue: Trace residue lining oral structures Location of oral residue : Tongue Initiation of pharyngeal swallow : Posterior laryngeal surface of  the epiglottis; Pyriform sinuses  Pharyngeal Impairment Domain: Pharyngeal Impairment Domain Soft palate elevation: No bolus between soft palate (SP)/pharyngeal wall (PW) Laryngeal elevation: Complete superior movement of thyroid cartilage with complete approximation of arytenoids to epiglottic petiole Anterior hyoid excursion: Complete anterior movement Epiglottic movement: Complete inversion Laryngeal vestibule closure: Incomplete, narrow column air/contrast in laryngeal vestibule Pharyngeal stripping wave : Present - complete Pharyngeal contraction (A/P view only): N/A Pharyngoesophageal segment opening: Partial distention/partial duration, partial obstruction of flow Tongue base retraction: No contrast between tongue base and posterior pharyngeal wall (PPW) Pharyngeal residue: Collection of residue within or on pharyngeal structures Location of pharyngeal residue: Pharyngeal wall; Valleculae; Pyriform sinuses; Tongue base  Esophageal Impairment Domain: Esophageal Impairment Domain Esophageal clearance upright position: Complete clearance, esophageal coating Pill: Pill  Consistency administered: Thin liquids (Level 0) Thin liquids (Level 0): Impaired (see clinical impressions) Penetration/Aspiration Scale Score: Penetration/Aspiration Scale Score 1.  Material does not enter airway: Thin liquids (Level 0); Mildly thick liquids (Level 2, nectar thick); Moderately thick liquids (Level 3, honey thick); Puree; Pill Compensatory Strategies: Compensatory Strategies Compensatory strategies: No   General Information: No data recorded Diet Prior to this Study: Thin liquids (Level 0)   No data recorded  Respiratory Status: WFL   Supplemental O2: Nasal cannula   History of Recent Intubation: No  Behavior/Cognition: Alert; Cooperative; Pleasant mood Self-Feeding Abilities: Able to self-feed Baseline vocal quality/speech: Normal Volitional Cough: Able to elicit Volitional Swallow: Able to elicit Exam Limitations: No limitations  Goal Planning: Prognosis for improved oropharyngeal function: Good No data recorded No data recorded Patient/Family Stated Goal: mouth is dry, would like some oral fluids Consulted and agree with results and recommendations: Patient Pain: Pain Assessment Pain Assessment: No/denies pain End of Session: Start Time:SLP Start Time (ACUTE ONLY): 1240 Stop Time: SLP Stop Time (ACUTE ONLY): 1255 Time Calculation:SLP Time Calculation (min) (ACUTE ONLY): 15 min Charges: SLP Evaluations $ SLP Speech Visit: 1 Visit SLP Evaluations $BSS Swallow: 1 Procedure $MBS Swallow: 1 Procedure SLP visit diagnosis: SLP Visit Diagnosis: Dysphagia, pharyngoesophageal phase (R13.14) Past Medical History: Past Medical History: Diagnosis Date  Neuropathy  Past Surgical History: No past surgical history on file. Angela Nevin, MA, CCC-SLP Speech Therapy        Scheduled Meds:  amoxicillin-clavulanate  1 tablet Oral Q12H   azithromycin  500 mg Oral Daily   enoxaparin (LOVENOX) injection  40 mg Subcutaneous QHS   feeding supplement  1 Container Oral TID BM   FLUoxetine  20 mg Oral BID   gabapentin  600 mg Oral BID   guaiFENesin  600 mg Oral BID   pantoprazole  40 mg Oral Daily   Continuous Infusions:     LOS: 3 days    Time spent: 48 minutes spent on chart review, discussion with nursing staff, consultants, updating family and interview/physical exam; more than 50% of that time was spent in counseling and/or coordination of care.    Alvira Philips [place], DO [facility] Available via Epic secure chat 7am-7pm After these hours, please refer to coverage provider listed on [email or web address] 06/07/2023, 10:46 AM

## 2023-06-07 NOTE — Evaluation (Signed)
 Physical Therapy Evaluation Patient Details Name: David Mcgrath MRN: [id number] DOB: 30-Jan-1951 Today's Date: 06/07/2023  History of Present Illness  73 yo male admitted with asp pna, aki, flu+, weakness. Hx of basal cell Ca, neuropathy, gout, anxiety/depression  Clinical Impression  On eval, pt required Mod A for mobility. Pt presents with general weakness, decreased activity tolerance, and impaired gait and balance. He had severe coughing spells throughout session. O2 dropped to 79% on RA with getting to EO-placed pt back on [place] O2-remained on 2L [place] O2 for remainder of session. Pt is very weak and fatigues quickly/easily with minimal activity. He is still experiencing diarrhea-required total A for toileting hygiene. Pt was too weak to get over to recliner so assisted pt back to bed. Once settled back in bed, pt vomited all over himself. RN/NT made aware. Wife present during session. Discussed d/c plan-hopeful for d/c back home. Patient will benefit from continued inpatient follow up therapy, <3 hours/day.       If plan is discharge home, recommend the following: A lot of help with walking and/or transfers;A lot of help with bathing/dressing/bathroom;Assistance with cooking/housework;Assist for transportation;Help with stairs or ramp for entrance   Can travel by private vehicle        Equipment Recommendations Rolling walker (2 wheels) (if they do not have one already-check with wife)  Recommendations for Other Services       Functional Status Assessment Patient has had a recent decline in their functional status and demonstrates the ability to make significant improvements in function in a reasonable and predictable amount of time.     Precautions / Restrictions Precautions Precautions: Fall Precaution/Restrictions Comments: monitor O2;diarrhea, coughing spells Restrictions Weight Bearing Restrictions Per Provider Order: No      Mobility  Bed Mobility Overal bed mobility:  Needs Assistance Bed Mobility: Supine to Sit, Sit to Supine     Supine to sit: Min assist, HOB elevated, Used rails Sit to supine: Min assist, HOB elevated, Used rails   General bed mobility comments: Assist required. Increased time. Cues provided as needed.    Transfers Overall transfer level: Needs assistance Equipment used: Rolling walker (2 wheels) Transfers: Sit to/from Stand, Bed to chair/wheelchair/BSC Sit to Stand: From elevated surface, Mod assist Stand pivot transfers: Mod assist         General transfer comment: Assist to power up, stabilize, control descent. Cues for safety, technique, hand placement. Increased time. Stand pivot x 2, bed<>bsc, with RW. Very weak. Fall risk.    Ambulation/Gait NT-too weak                 Stairs            Wheelchair Mobility     Tilt Bed    Modified Rankin (Stroke Patients Only)       Balance Overall balance assessment: Needs assistance Sitting-balance support: Feet supported Sitting balance-Leahy Scale: Fair     Standing balance support: Reliant on assistive device for balance, During functional activity, Bilateral upper extremity supported Standing balance-Leahy Scale: Poor                               Pertinent Vitals/Pain Pain Assessment Pain Assessment: Faces Faces Pain Scale: No hurt    Home Living Family/patient expects to be discharged to:: Private residence Living Arrangements: Spouse/significant other Available Help at Discharge: Family Type of Home: House Home Access: Stairs to enter Entrance Stairs-Rails: Left Armed forces technical officer) Entrance  Stairs-Number of Steps: 2   Home Layout: One level Home Equipment:  (walking stick)      Prior Function Prior Level of Function : Independent/Modified Independent             Mobility Comments: uses walking stick outside of home. goes to the gym.       Extremity/Trunk Assessment   Upper Extremity Assessment Upper Extremity  Assessment: Defer to OT evaluation    Lower Extremity Assessment Lower Extremity Assessment: Generalized weakness    Cervical / Trunk Assessment Cervical / Trunk Assessment: Normal  Communication   Communication Communication: No apparent difficulties    Cognition Arousal: Alert Behavior During Therapy: WFL for tasks assessed/performed   PT - Cognitive impairments: No apparent impairments                         Following commands: Intact       Cueing Cueing Techniques: Verbal cues     General Comments      Exercises     Assessment/Plan    PT Assessment Patient needs continued PT services  PT Problem List Decreased strength;Decreased range of motion;Decreased activity tolerance;Decreased balance;Decreased mobility;Decreased knowledge of use of DME       PT Treatment Interventions DME instruction;Gait training;Functional mobility training;Therapeutic activities;Therapeutic exercise;Patient/family education;Balance training    PT Goals (Current goals can be found in the Care Plan section)  Acute Rehab PT Goals Patient Stated Goal: get better. PT Goal Formulation: With patient/family Time For Goal Achievement: 06/21/23 Potential to Achieve Goals: Good    Frequency Min 2X/week     Co-evaluation               AM-PAC PT "6 Clicks" Mobility  Outcome Measure Help needed turning from your back to your side while in a flat bed without using bedrails?: A Little Help needed moving from lying on your back to sitting on the side of a flat bed without using bedrails?: A Little Help needed moving to and from a bed to a chair (including a wheelchair)?: A Lot Help needed standing up from a chair using your arms (e.g., wheelchair or bedside chair)?: A Lot Help needed to walk in hospital room?: Total Help needed climbing 3-5 steps with a railing? : Total 6 Click Score: 12    End of Session Equipment Utilized During Treatment: Gait belt;Oxygen Activity  Tolerance: Patient limited by fatigue Patient left: in bed;with call bell/phone within reach;with bed alarm set;with nursing/sitter in room        Time: 1120-1217 PT Time Calculation (min) (ACUTE ONLY): 57 min   Charges:   PT Evaluation $PT Eval Low Complexity: 1 Low PT Treatments $Therapeutic Activity: 38-52 mins PT General Charges $$ ACUTE PT VISIT: 1 Visit            Faye Ramsay, PT Acute Rehabilitation  Office: [phone]

## 2023-06-07 NOTE — Plan of Care (Signed)

## 2023-06-08 ENCOUNTER — Inpatient Hospital Stay (HOSPITAL_COMMUNITY)

## 2023-06-08 DIAGNOSIS — J690 Pneumonitis due to inhalation of food and vomit: Secondary | ICD-10-CM | POA: Diagnosis not present

## 2023-06-08 DIAGNOSIS — J09X1 Influenza due to identified novel influenza A virus with pneumonia: Secondary | ICD-10-CM | POA: Diagnosis not present

## 2023-06-08 LAB — COMPREHENSIVE METABOLIC PANEL
ALT: 47 U/L — ABNORMAL HIGH (ref 0–44)
AST: 62 U/L — ABNORMAL HIGH (ref 15–41)
Albumin: 2.4 g/dL — ABNORMAL LOW (ref 3.5–5.0)
Alkaline Phosphatase: 43 U/L (ref 38–126)
Anion gap: 11 (ref 5–15)
BUN: 17 mg/dL (ref 8–23)
CO2: 19 mmol/L — ABNORMAL LOW (ref 22–32)
Calcium: 8.5 mg/dL — ABNORMAL LOW (ref 8.9–10.3)
Chloride: 108 mmol/L (ref 98–111)
Creatinine, Ser: 0.87 mg/dL (ref 0.61–1.24)
GFR, Estimated: >60 mL/min (ref >60)
Glucose, Bld: 78 mg/dL (ref 70–99)
Potassium: 4.3 mmol/L (ref 3.5–5.1)
Sodium: 138 mmol/L (ref 135–145)
Total Bilirubin: 0.7 mg/dL (ref 0.0–1.2)
Total Protein: 5.4 g/dL — ABNORMAL LOW (ref 6.5–8.1)

## 2023-06-08 LAB — CBC
HCT: 33.7 % — ABNORMAL LOW (ref 39.0–52.0)
Hemoglobin: 11 g/dL — ABNORMAL LOW (ref 13.0–17.0)
MCH: 31.6 pg (ref 26.0–34.0)
MCHC: 32.6 g/dL (ref 30.0–36.0)
MCV: 96.8 fL (ref 80.0–100.0)
Platelets: 238 10*3/uL (ref 150–400)
RBC: 3.48 MIL/uL — ABNORMAL LOW (ref 4.22–5.81)
RDW: 14.1 % (ref 11.5–15.5)
WBC: 9.8 10*3/uL (ref 4.0–10.5)
nRBC: 0 % (ref 0.0–0.2)

## 2023-06-08 LAB — STREP PNEUMONIAE URINARY ANTIGEN: Strep Pneumo Urinary Antigen: NEGATIVE

## 2023-06-08 MED ORDER — SODIUM CHLORIDE 0.9 % IV SOLN
2.0000 g | Freq: Three times a day (TID) | INTRAVENOUS | Status: DC
  Administered 2023-06-08 – 2023-06-15 (×21): 2 g via INTRAVENOUS
  Filled 2023-06-08 (×21): qty 12.5

## 2023-06-08 MED ORDER — LINEZOLID 600 MG/300ML IV SOLN
600.0000 mg | Freq: Two times a day (BID) | INTRAVENOUS | Status: DC
  Administered 2023-06-08 – 2023-06-10 (×4): 600 mg via INTRAVENOUS
  Filled 2023-06-08 (×4): qty 300

## 2023-06-08 MED ORDER — IOHEXOL 300 MG/ML  SOLN
100.0000 mL | Freq: Once | INTRAMUSCULAR | Status: AC | PRN
Start: 2023-06-08 — End: 2023-06-08
  Administered 2023-06-08: 100 mL via INTRAVENOUS

## 2023-06-08 MED ORDER — SODIUM CHLORIDE 0.9 % IV SOLN
3.0000 g | Freq: Four times a day (QID) | INTRAVENOUS | Status: DC
  Administered 2023-06-08 (×2): 3 g via INTRAVENOUS
  Filled 2023-06-08 (×3): qty 8

## 2023-06-08 NOTE — Evaluation (Signed)
 Occupational Therapy Evaluation Patient Details Name: David Mcgrath MRN: [id number] DOB: 1950/06/12 Today's Date: 06/08/2023   History of Present Illness   73 yo male admitted with asp pna, aki, flu+, weakness. Hx of basal cell Ca, neuropathy, gout, anxiety/depression     Clinical Impressions PTA, pt lives with spouse and typically Modified Independent with ADLs, IADLs and mobility with intermittent use of cane. Pt reports active at baseline, going to gym 3x/wk. Pt presents now with deficits in strength and endurance. Evaluation limited as coughing fit occurred with initiation of mobility w/ pt fear of vomiting recently consumed contrast for CT today (coughing led to vomiting during PT eval yesterday). Bed level, pt requires Setup Assist for UB ADL and Total A for LB ADLs. Pt's wife at bedside and both agreeable for postacute rehab stay if pt still with limited mobility by end of acute stay.   SpO2 95% on RA at rest, desat to 89% w/ coughing fit w/ slow recovery to 92%     If plan is discharge home, recommend the following:   A lot of help with walking and/or transfers;Two people to help with walking and/or transfers;A lot of help with bathing/dressing/bathroom;Two people to help with bathing/dressing/bathroom     Functional Status Assessment   Patient has had a recent decline in their functional status and demonstrates the ability to make significant improvements in function in a reasonable and predictable amount of time.     Equipment Recommendations   None recommended by OT     Recommendations for Other Services         Precautions/Restrictions   Precautions Precautions: Fall Precaution/Restrictions Comments: monitor O2;coughing spells Restrictions Weight Bearing Restrictions Per Provider Order: No     Mobility Bed Mobility               General bed mobility comments: declined to attempt EOB due to coughing fit and fear of vomiting contrast     Transfers                   General transfer comment: declined      Balance                                           ADL either performed or assessed with clinical judgement   ADL Overall ADL's : Needs assistance/impaired Eating/Feeding: Set up;Bed level;Sitting   Grooming: Set up;Standing;Sitting   Upper Body Bathing: Set up;Sitting;Bed level   Lower Body Bathing: Maximal assistance;Bed level   Upper Body Dressing : Set up;Sitting;Bed level   Lower Body Dressing: Total assistance;Bed level Lower Body Dressing Details (indicate cue type and reason): sock mgmt     Toileting- Clothing Manipulation and Hygiene: Total assistance;Bed level         General ADL Comments: Limited to bed level due to pt began to have coughing fit- previously vomited with coughing fit w/ mobility attempts yesterday and pt worried about vomiting recently consumed contrast for CT.     Vision Ability to See in Adequate Light: 0 Adequate Patient Visual Report: No change from baseline Vision Assessment?: No apparent visual deficits     Perception         Praxis         Pertinent Vitals/Pain Pain Assessment Pain Assessment: No/denies pain     Extremity/Trunk Assessment Upper Extremity Assessment Upper Extremity Assessment: Generalized weakness;Right hand  dominant   Lower Extremity Assessment Lower Extremity Assessment: Defer to PT evaluation   Cervical / Trunk Assessment Cervical / Trunk Assessment: Normal   Communication Communication Communication: No apparent difficulties   Cognition Arousal: Alert Behavior During Therapy: WFL for tasks assessed/performed Cognition: No apparent impairments                               Following commands: Intact       Cueing  General Comments   Cueing Techniques: Verbal cues  Wife at bedside. Pt 95% on RA in bed. after coughing fit, 89% slowly increasing to 92%. Discussed breathing  techniques, minimizing coughing fit and flutter valve exercises encouraged after CT today.   Exercises     Shoulder Instructions      Home Living Family/patient expects to be discharged to:: Private residence Living Arrangements: Spouse/significant other Available Help at Discharge: Family Type of Home: House Home Access: Stairs to enter Secretary/administrator of Steps: 2 Entrance Stairs-Rails: Left Home Layout: One level     Bathroom Shower/Tub: Producer, television/film/video: Standard     Home Equipment: Pharmacist, hospital (2 wheels);Cane - single point          Prior Functioning/Environment Prior Level of Function : Independent/Modified Independent             Mobility Comments: uses cane outside of the home, goes to gym 3x/wk. wife reports pt furniture walks in the home for almost a year d/t neuropathy progression ADLs Comments: Mod I with ADLs, uses shower chair. assists with IADLs    OT Problem List: Decreased strength;Decreased activity tolerance;Impaired balance (sitting and/or standing);Cardiopulmonary status limiting activity;Decreased knowledge of use of DME or AE   OT Treatment/Interventions: Self-care/ADL training;Therapeutic exercise;Energy conservation;DME and/or AE instruction;Therapeutic activities;Patient/family education      OT Goals(Current goals can be found in the care plan section)   Acute Rehab OT Goals Patient Stated Goal: hopeful to improve, open to rehab OT Goal Formulation: With patient/family Time For Goal Achievement: 06/22/23 Potential to Achieve Goals: Good   OT Frequency:  Min 1X/week    Co-evaluation              AM-PAC OT "6 Clicks" Daily Activity     Outcome Measure Help from another person eating meals?: A Little Help from another person taking care of personal grooming?: A Little Help from another person toileting, which includes using toliet, bedpan, or urinal?: Total Help from another person bathing  (including washing, rinsing, drying)?: A Lot Help from another person to put on and taking off regular upper body clothing?: A Little Help from another person to put on and taking off regular lower body clothing?: Total 6 Click Score: 13   End of Session Nurse Communication: Mobility status  Activity Tolerance: Treatment limited secondary to medical complications (Comment) (coughing fit, wanting to avoid potential vomiting of contrast) Patient left: in bed;with call bell/phone within reach;with family/visitor present  OT Visit Diagnosis: Other abnormalities of gait and mobility (R26.89);Muscle weakness (generalized) (M62.81);Unsteadiness on feet (R26.81)                Time: 0981-1914 OT Time Calculation (min): 20 min Charges:  OT General Charges $OT Visit: 1 Visit OT Evaluation $OT Eval Low Complexity: 1 Low  Bradd Canary, OTR/L Acute Rehab Services Office: [phone]   Lorre Munroe 06/08/2023, 10:19 AM

## 2023-06-08 NOTE — Progress Notes (Signed)
 Pharmacy Antibiotic Note  David Mcgrath is a 73 y.o. male admitted on 06/04/2023 with acute hypoxic respiratory failure, influenza A (outside of treatment window), and aspiration pneumonia.  Pharmacy has been consulted for cefepime dosing.  Plan: Initiate cefepime 2 g IV every 8 hours Linezolid 600 mg IV every 12 hours per provider Monitor clinical progress & renal function F/U C&S, abx deescalation / LOT   Height: 6\' 4"  (193 cm) Weight: 113.5 kg (250 lb 4.8 oz) IBW/kg (Calculated) : 86.8  Temp (24hrs), Avg:99.6 F (37.6 C), Min:98.7 F (37.1 C), Max:102.1 F (38.9 C)  Recent Labs  Lab 06/04/23 1009 06/04/23 1346 06/05/23 0523 06/06/23 0535 06/07/23 0533 06/08/23 0520  WBC 7.1  --  12.3* 6.9 7.7 9.8  CREATININE 1.95*  --  2.11* 1.06 0.85 0.87  LATICACIDVEN  --  1.5  --   --   --   --     Estimated Creatinine Clearance: 107.4 mL/min (by C-G formula based on SCr of 0.87 mg/dL).    Allergies  Allergen Reactions   Shrimp [Shellfish Allergy]     Antimicrobials:  Azithromycin 3/2 - 3/6 Unasyn 3/2 - 3/4; 3/6 x 2 Augmentin 3/5 - 3/5 Linezolid 3/6>> Cefepime 3/6>>   Microbiology results: 3/2 BCx: 1/4 Bcx bottles growing staph epi with mecA resistance (MRSE). Likely a contaminant.  3/2 positive for influenza A   Thank you for allowing pharmacy to be a part of this patient's care.  Selinda Eon, PharmD, BCPS Clinical Pharmacist [place] 06/08/2023 3:27 PM

## 2023-06-08 NOTE — Progress Notes (Addendum)
 PROGRESS NOTE    Kiet Geer  UUV:[id number] DOB: January 27, 1951 DOA: 06/04/2023 PCP: Charlane Ferretti, DO    Brief Narrative:   Kyl Givler is a 73 y.o. male with past medical history significant for basal cell carcinoma, HTN, HLD, neuropathy, gout, anxiety/depression, GERD who presented to [facility], ED on 06/04/2023 with 6S 7-day history of progressive shortness of breath, fatigue, poor oral intake, right-sided abdominal pain, nausea/vomiting/diarrhea. He was seen by his PCP and most recently Pulmonology in Jan 2025 for his cough. Symptoms were attributed to bronchitis and patient has been given steroids and antibiotics, he reports have these have not helped. Denies chest pain, palpitations, fevers, chills, or known sick contacts.    In the ED, temperature 37.1 C, BP 141/56, HR 40, RR 23, SpO2 89% on room air and later 94% on 2 to 3 L via nasal cannula. WBC 7.1, hemoglobin 12.5, platelet count 224.  Sodium 132, CO2 21, glucose 112, BUN 38, creatinine 1.95, calcium 8.2, total protein 6.3, bili 3.3, AST 96, ALT 49, respiratory panel positive for influenza A, and lactic acid 1.5.  CXR obtained and shows predominant interstitial coarsening suspicious for viral Mycoplasma pneumonia.  CTA PE obtained and negative for pulmonary embolus does show airspace opacities throughout the bilateral lung bases with as well as esophagus that is thickened and partially fluid filled suggestive of reflux and aspiration. CT abd/pelvis obtained and shows multiple loopss of gas and fluid distended small bowel consistent with ileus, no transition point. 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 EDP curbsided Dr Marchelle Gearing patients outpatient pulmonologist who recommended empirically covering with Unasyn and Zithromax. Patient was given Unasyn, Zithromax, 1L NS bolus, and Duoneb. TRH contacted for admission.  Assessment & Plan:   Acute  Hypoxic Respiratory Failure Influenza A Aspiration Pneumonia Patient presenting to the ED with progressive shortness of breath, fever, fatigue as well as nausea/vomiting/diarrhea.  CT angiogram chest negative for PE but with airspace opacities throughout the bilateral lung bases with partially fluid-filled esophagus concerning for aspiration/reflux.  Given patient's symptoms began greater than 48 hours prior to ED presentation, outside of window for Tamiflu. -- s/p 5 day course azithromycin -- Linezolid 600 mg IV q12h -- Cefepime -- Continue supplemental oxygen, maintain SpO2 greater than 92%; on room air this morning with SpO2 93% at rest -- Tessalon/Hycodan as needed cough -- Incentive spirometry, flutter valve  Ileus:  CT Abdomen suggestive of ileus vs early SBO. No history of small bowel obstruction or abdominal surgeries.  -- advanced to soft diet -- Repeat CT abdomen/pelvis today shows persistent mildly dilated loops of small bowel with no abrupt transition with contrast crossing into colon. -- Zofran 4mg  IV q6h PRN    Acute Kidney Injury: resolved Patient presenting with a elevated creatinine of 1.95.  No baseline available for review in EMR or Care Everywhere. -- Cr 1.95>2.11>1.06>0.85 -- holding home olmesartan -- Avoid nephrotoxins, renal dose all medications -- BMP daily  Hepatic Steatosis Elevated LFTs -- AST 96>79>59>62 -- ALT 49>43>41>47 -- Tbili 0.6>0.8>0.5 -- Hold home statin -- CMP daily   Hyperlipidemia -- Holding  home statin as above   Diarrhea: Resolved Likely secondary to influenza A viral infection.  No further diarrhea, will discontinue C. difficile/GI pathogen panel and enteric precautions.   Hypertension -- Hold home Olmesartan -- PRN hydralazine  Anxiety/depression: -- Prozac 20 mg p.o. twice daily  Neuropathy -- Gabapentin 600 mg p.o. 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  Recent colonoscopy with no concerning findings per patient.  CT chest, abdomen/pelvis with no findings suggestive of primary malignancy.  Alkaline phosphatase within normal limits.  Unclear etiology of osseous lesions.  Would likely benefit from outpatient follow-up with PCP/oncology and would likely need outpatient biopsy, PET scan for further workup. -- SPEP/UPEP ordered  Weakness/debility/deconditioning: -- PT/OT recommending SNF placement -- TOC consulted   DVT prophylaxis: enoxaparin (LOVENOX) injection 40 mg Start: 06/04/23 2200    Code Status: Full Code Family Communication: Updated spouse at bedside this morning  Disposition Plan:  Level of care: Med-Surg Status is: Inpatient Remains inpatient appropriate because: IV antibiotics, repeat imaging given recurrence of fever, nausea/vomiting    Consultants:  None  Procedures:  None  Antimicrobials:  Azithromycin 3/2 - 3/6 Unasyn 3/2 - 3/4; 3/6 Augmentin 3/5 - 3/5 Linezolid 3/6>> Cefepime 3/6>>   Subjective: Patient seen examined at bedside, lying in bed.  Spouse present at bedside as well as Charity fundraiser.  Recurrence of nausea and vomiting yesterday, thought to be due to uncontrolled cough.  Also with recurrence of fever this morning, 102.1.  Was initially replaced on oxygen yesterday, now off at bedside with SpO2 93% at rest.  Will restart IV Unasyn today.  Repeat imaging with a chest/abdomen/pelvis CT.  He is with generalized weakness and fatigue, seen by therapy with recommendation of SNF placement.  No other specific questions, concerns or complaints at this time.  Denies headache, no dizziness, no chest pain, no palpitations, no abdominal pain, no chills/night sweats, no current nausea/vomiting, no abdominal pain, no focal weakness, no fatigue, no paresthesia.  No acute events overnight per nursing.  Objective: Vitals:   06/07/23 2121 06/08/23 0538 06/08/23 0706 06/08/23 1137  BP: 135/71 138/65 134/64 (!)  141/65  Pulse: 88 93 97 87  Resp: 14 14 20    Temp: 98.9 F (37.2 C) (!) 102.1 F (38.9 C) 98.7 F (37.1 C) 98.7 F (37.1 C)  TempSrc:   Oral Oral  SpO2: 97% 92% 96% 93%  Weight:      Height:        Intake/Output Summary (Last 24 hours) at 06/08/2023 1519 Last data filed at 06/08/2023 0544 Gross per 24 hour  Intake 50 ml  Output 400 ml  Net -350 ml   Filed Weights   06/07/23 1820  Weight: 113.5 kg    Examination:  Physical Exam: GEN: NAD, alert and oriented x 3, ill in appearance HEENT: NCAT, PERRL, EOMI, sclera clear, MMM PULM: Breath sounds slight diminished bilateral bases, no wheezes/crackles, normal respiratory effort without accessory muscle use, on room air with SpO2 93% at rest CV: RRR w/o M/G/R GI: abd soft, NTND, + BS MSK: no peripheral edema, muscle strength globally intact 5/5 bilateral upper/lower extremities NEURO: CN II-XII intact, no focal deficits, sensation to light touch intact PSYCH: normal mood/affect Integumentary: No concerning rashes/lesions/wounds noted on exposed skin surfaces    Data Reviewed: I have personally reviewed following labs and imaging studies  CBC: Recent Labs  Lab 06/04/23 1009 06/05/23 0523 06/06/23 0535 06/07/23 0533 06/08/23 0520  WBC 7.1 12.3* 6.9 7.7 9.8  NEUTROABS 6.2  --   --   --   --   HGB 12.5* 10.2* 10.3* 10.5* 11.0*  HCT 38.0* 31.7* 31.9* 32.7* 33.7*  MCV 96.2 99.4 100.0 99.7 96.8  PLT 224 186 177 194 238  Basic Metabolic Panel: Recent Labs  Lab 06/04/23 1009 06/04/23 1439 06/05/23 0523 06/06/23 0535 06/07/23 0533 06/08/23 0520  NA 132*  --  135 134* 135 138  K 4.5  --  4.3 3.9 3.8 4.3  CL 100  --  108 108 109 108  CO2 21*  --  18* 17* 18* 19*  GLUCOSE 112*  --  69* 61* 105* 78  BUN 38*  --  42* 28* 19 17  CREATININE 1.95*  --  2.11* 1.06 0.85 0.87  CALCIUM 8.2*  --  7.5* 7.6* 7.9* 8.5*  MG  --  2.0 1.9 2.1  --   --   PHOS  --   --   --  2.2*  --   --    GFR: Estimated Creatinine Clearance:  107.4 mL/min (by C-G formula based on SCr of 0.87 mg/dL). Liver Function Tests: Recent Labs  Lab 06/04/23 1009 06/05/23 0523 06/06/23 0535 06/07/23 0533 06/08/23 0520  AST 96* 79* 71* 59* 62*  ALT 49* 43 40 41 47*  ALKPHOS 52 38 37* 40 43  BILITOT 0.6 0.8 0.6 0.5 0.7  PROT 6.3* 5.1* 4.8* 5.0* 5.4*  ALBUMIN 3.3* 2.4* 2.2* 2.2* 2.4*   No results for input(s): "LIPASE", "AMYLASE" in the last 168 hours. No results for input(s): "AMMONIA" in the last 168 hours. Coagulation Profile: No results for input(s): "INR", "PROTIME" in the last 168 hours. Cardiac Enzymes: No results for input(s): "CKTOTAL", "CKMB", "CKMBINDEX", "TROPONINI" in the last 168 hours. BNP (last 3 results) No results for input(s): "PROBNP" in the last 8760 hours. HbA1C: No results for input(s): "HGBA1C" in the last 72 hours. CBG: No results for input(s): "GLUCAP" in the last 168 hours. Lipid Profile: No results for input(s): "CHOL", "HDL", "LDLCALC", "TRIG", "CHOLHDL", "LDLDIRECT" in the last 72 hours. Thyroid Function Tests: No results for input(s): "TSH", "T4TOTAL", "FREET4", "T3FREE", "THYROIDAB" in the last 72 hours. Anemia Panel: No results for input(s): "VITAMINB12", "FOLATE", "FERRITIN", "TIBC", "IRON", "RETICCTPCT" in the last 72 hours. Sepsis Labs: Recent Labs  Lab 06/04/23 1346 06/04/23 1439 06/06/23 0535  PROCALCITON  --  0.16 2.98  LATICACIDVEN 1.5  --   --     Recent Results (from the past 240 hours)  Resp panel by RT-PCR (RSV, Flu A&B, Covid) Anterior Nasal Swab     Status: Abnormal   Collection Time: 06/04/23 10:17 AM   Specimen: Anterior Nasal Swab  Result Value Ref Range Status   SARS Coronavirus 2 by RT PCR NEGATIVE NEGATIVE Final    Comment: (NOTE) SARS-CoV-2 target nucleic acids are NOT DETECTED.  The SARS-CoV-2 RNA is generally detectable in upper respiratory specimens during the acute phase of infection. The lowest concentration of SARS-CoV-2 viral copies this assay can detect  is 138 copies/mL. A negative result does not preclude SARS-Cov-2 infection and should not be used as the sole basis for treatment or other patient management decisions. A negative result may occur with  improper specimen collection/handling, submission of specimen other than nasopharyngeal swab, presence of viral mutation(s) within the areas targeted by this assay, and inadequate number of viral copies(<138 copies/mL). A negative result must be combined with clinical observations, patient history, and epidemiological information. The expected result is Negative.  Fact Sheet for Patients:  BloggerCourse.com  Fact Sheet for Healthcare Providers:  SeriousBroker.it  This test is no t yet approved or cleared by the [place] FDA and  has been authorized for detection and/or diagnosis of SARS-CoV-2 by FDA under an Emergency  Use Authorization (EUA). This EUA will remain  in effect (meaning this test can be used) for the duration of the COVID-19 declaration under Section 564(b)(1) of the Act, 21 U.S.C.section 360bbb-3(b)(1), unless the authorization is terminated  or revoked sooner.       Influenza A by PCR POSITIVE (A) NEGATIVE Final   Influenza B by PCR NEGATIVE NEGATIVE Final    Comment: (NOTE) The Xpert Xpress SARS-CoV-2/FLU/RSV plus assay is intended as an aid in the diagnosis of influenza from Nasopharyngeal swab specimens and should not be used as a sole basis for treatment. Nasal washings and aspirates are unacceptable for Xpert Xpress SARS-CoV-2/FLU/RSV testing.  Fact Sheet for Patients: BloggerCourse.com  Fact Sheet for Healthcare Providers: SeriousBroker.it  This test is not yet approved or cleared by the [place] FDA and has been authorized for detection and/or diagnosis of SARS-CoV-2 by FDA under an Emergency Use Authorization (EUA). This EUA will remain in  effect (meaning this test can be used) for the duration of the COVID-19 declaration under Section 564(b)(1) of the Act, 21 U.S.C. section 360bbb-3(b)(1), unless the authorization is terminated or revoked.     Resp Syncytial Virus by PCR NEGATIVE NEGATIVE Final    Comment: (NOTE) Fact Sheet for Patients: BloggerCourse.com  Fact Sheet for Healthcare Providers: SeriousBroker.it  This test is not yet approved or cleared by the [place] FDA and has been authorized for detection and/or diagnosis of SARS-CoV-2 by FDA under an Emergency Use Authorization (EUA). This EUA will remain in effect (meaning this test can be used) for the duration of the COVID-19 declaration under Section 564(b)(1) of the Act, 21 U.S.C. section 360bbb-3(b)(1), unless the authorization is terminated or revoked.  Performed at [facility], [address], [place], [place] [address]   Culture, blood (routine x 2)     Status: Abnormal   Collection Time: 06/04/23  2:44 PM   Specimen: BLOOD  Result Value Ref Range Status   Specimen Description   Final    BLOOD RIGHT ANTECUBITAL Performed at [facility], [address] [place], [place] [address]    Special Requests   Final    BOTTLES DRAWN AEROBIC AND ANAEROBIC Blood Culture adequate volume Performed at [facility], [address], [place], [place] [address]    Culture  Setup Time   Final    GRAM POSITIVE COCCI IN CLUSTERS AEROBIC BOTTLE ONLY CRITICAL RESULT CALLED TO, READ BACK BY AND VERIFIED WITH: PHARMD SYDNEY DAVIS [id number] AT 1556 BY EC    Culture (A)  Final    STAPHYLOCOCCUS EPIDERMIDIS THE SIGNIFICANCE OF ISOLATING THIS ORGANISM FROM A SINGLE SET OF BLOOD CULTURES WHEN MULTIPLE SETS ARE DRAWN IS UNCERTAIN. PLEASE NOTIFY THE MICROBIOLOGY DEPARTMENT WITHIN ONE WEEK IF SPECIATION AND SENSITIVITIES ARE REQUIRED. Performed at [facility], [address] [place], [place] [address]    Report Status 06/06/2023 FINAL  Final  Blood Culture ID Panel (Reflexed)     Status: Abnormal   Collection Time: 06/04/23  2:44 PM  Result Value Ref Range Status   Enterococcus faecalis NOT DETECTED NOT DETECTED Final   Enterococcus Faecium NOT DETECTED NOT DETECTED Final   Listeria monocytogenes NOT DETECTED NOT DETECTED Final   Staphylococcus species DETECTED (A) NOT DETECTED Final    Comment: CRITICAL RESULT CALLED TO, READ BACK BY AND VERIFIED WITH: PHARMD SYDNEY DAVIS [id number] AT 1556Y EC    Staphylococcus aureus (BCID) NOT DETECTED NOT DETECTED Final   Staphylococcus epidermidis  DETECTED (A) NOT DETECTED Final    Comment: Methicillin (oxacillin) resistant coagulase negative staphylococcus. Possible blood culture contaminant (unless isolated from more than one blood culture draw or clinical case suggests pathogenicity). No antibiotic treatment is indicated for blood  culture contaminants. CRITICAL RESULT CALLED TO, READ BACK BY AND VERIFIED WITH: PHARMD SYDNEY DAVIS [id number] AT 1556 BY EC    Staphylococcus lugdunensis NOT DETECTED NOT DETECTED Final   Streptococcus species NOT DETECTED NOT DETECTED Final   Streptococcus agalactiae NOT DETECTED NOT DETECTED Final   Streptococcus pneumoniae NOT DETECTED NOT DETECTED Final   Streptococcus pyogenes NOT DETECTED NOT DETECTED Final   A.calcoaceticus-baumannii NOT DETECTED NOT DETECTED Final   Bacteroides fragilis NOT DETECTED NOT DETECTED Final   Enterobacterales NOT DETECTED NOT DETECTED Final   Enterobacter cloacae complex NOT DETECTED NOT DETECTED Final   Escherichia coli NOT DETECTED NOT DETECTED Final   Klebsiella aerogenes NOT DETECTED NOT DETECTED Final   Klebsiella oxytoca NOT DETECTED NOT DETECTED Final   Klebsiella pneumoniae NOT DETECTED NOT DETECTED Final   Proteus species NOT DETECTED NOT DETECTED Final   Salmonella species NOT DETECTED NOT DETECTED Final   Serratia  marcescens NOT DETECTED NOT DETECTED Final   Haemophilus influenzae NOT DETECTED NOT DETECTED Final   Neisseria meningitidis NOT DETECTED NOT DETECTED Final   Pseudomonas aeruginosa NOT DETECTED NOT DETECTED Final   Stenotrophomonas maltophilia NOT DETECTED NOT DETECTED Final   Candida albicans NOT DETECTED NOT DETECTED Final   Candida auris NOT DETECTED NOT DETECTED Final   Candida glabrata NOT DETECTED NOT DETECTED Final   Candida krusei NOT DETECTED NOT DETECTED Final   Candida parapsilosis NOT DETECTED NOT DETECTED Final   Candida tropicalis NOT DETECTED NOT DETECTED Final   Cryptococcus neoformans/gattii NOT DETECTED NOT DETECTED Final   Methicillin resistance mecA/C DETECTED (A) NOT DETECTED Final    Comment: CRITICAL RESULT CALLED TO, READ BACK BY AND VERIFIED WITH: PHARMD SYDNEY DAVIS [id number] AT 1556 BY EC Performed at [facility], [address] [name], [place] [address]   Culture, blood (routine x 2)     Status: None (Preliminary result)   Collection Time: 06/04/23  2:47 PM   Specimen: BLOOD  Result Value Ref Range Status   Specimen Description   Final    BLOOD LEFT ANTECUBITAL Performed at [facility], [address] [place], [place] [address]    Special Requests   Final    BOTTLES DRAWN AEROBIC AND ANAEROBIC Blood Culture results may not be optimal due to an inadequate volume of blood received in culture bottles Performed at [facility], [address] [place], [place] [address]    Culture   Final    NO GROWTH 4 DAYS Performed at [facility], [address], [place], [place] [address]    Report Status PENDING  Incomplete         Radiology Studies: CT CHEST ABDOMEN PELVIS W CONTRAST Result Date: 06/08/2023 CLINICAL DATA:  Abdominal pain. Dyspnea. No lytic bone lesions. * Tracking Code: BO * EXAM: CT CHEST, ABDOMEN, AND PELVIS WITH CONTRAST TECHNIQUE: Multidetector CT imaging of the chest, abdomen and pelvis  was performed following the standard protocol during bolus administration of intravenous contrast. RADIATION DOSE REDUCTION: This exam was performed according to the departmental dose-optimization program which includes automated exposure control, adjustment of the mA and/or kV according to patient size and/or use of iterative reconstruction technique. CONTRAST:  OMNIPAQUE IOHEXOL 300 MG/ML  SOLN COMPARISON:  CTA chest 06/04/2023. Standard abdomen pelvis CT with contrast 06/04/2023. FINDINGS: CT CHEST FINDINGS Cardiovascular: Heart is nonenlarged. No pericardial effusion. Coronary artery calcifications are seen. There are calcifications along the aortic valve. The thoracic aorta is normal course and caliber. There is a bovine type aortic arch. Mediastinum/Nodes: Small thyroid gland. Slightly patulous thoracic esophagus. No specific abnormal lymph node enlargement identified in the axillary region or hila. There are some borderline mediastinal nodes identified, unchanged from the previous examination. Example precarinal node has a short axis of 10 mm. Small subcarinal and paratracheal nodes. Lungs/Pleura: No pneumothorax. Developing tiny right pleural effusion greater than left. Extensive breathing motion throughout the examination. Once again there are areas of ground-glass opacities in the upper lobes which are increasing. There is also ill-defined parenchymal opacity along the lower lobes. The left lower lobe opacity is slightly less confluent and consolidative but there is significant residual. Similar changes along the right lower lobe. Musculoskeletal: Scattered degenerative changes along the spine. There are several scattered lucent bone lesions identified. Examples include the posterior aspect of the left fourth rib, posterolateral aspect of the right sixth rib. Also some areas along the thoracic spine such as right transverse process of T1. Please correlate for any known history of malignancy or  myeloma. CT ABDOMEN PELVIS FINDINGS Hepatobiliary: No space-occupying liver lesion. Gallbladder is distended with some high density material, possible vicarious excretion of previous contrast administration. Patent portal vein. Pancreas: Mild global pancreatic atrophy.  No obvious mass. Spleen: Spleen is not enlarged. Adrenals/Urinary Tract: Adrenal glands are unremarkable. Kidneys are normal, without renal calculi, focal lesion, or hydronephrosis. Bladder is unremarkable. Stomach/Bowel: Oral contrast was administered. Stomach is nondilated. Once again there are some dilated loops of small bowel in left mid abdomen. Few air-fluid levels. Loops measure up to 4 cm in diameter. Oral contrast is seen extending into some portions of the colon. There are decompressed loops in the right mid abdomen of small bowel. Large bowel is nondilated. Vascular/Lymphatic: Aortic atherosclerosis. No enlarged abdominal or pelvic lymph nodes. Reproductive: Prostate is unremarkable. Left testicle is along the left inguinal region. Other: Anasarca. Motion. There is a left-sided posterior abdominal wall hernia at the level of the left kidney containing fat. Musculoskeletal: Degenerative changes along the spine. Once again there are some scattered lytic lesions along the pelvis and lumbar spine. IMPRESSION: Once again scattered bony lytic lesions identified diffusely. Please correlate for any known history including an no malignancy such as myeloma. Changing lung opacities. Increasing ground-glass areas along both upper lungs. The confluent areas in the lower lobes are less confluent today but there is significant residual. There are increasing small pleural effusions, right-greater-than-left. Persistent mildly dilated loops of small bowel in the left midabdomen. No abrupt transition and there is some contrast crossing into the colon. There are decompressed loops of small bowel in the right lower quadrant. Please correlate for ileus or very  low-grade obstruction. Recommend continued follow up surveillance. Evaluation limited by motion. Electronically Signed   By: Karen Kays M.D.   On: 06/08/2023 14:07         Scheduled Meds:  enoxaparin (LOVENOX) injection  40 mg Subcutaneous QHS   feeding supplement  1 Container Oral TID BM   gabapentin  600 mg Oral BID   guaiFENesin  600 mg Oral BID   pantoprazole  40 mg Oral Daily   Continuous Infusions:  linezolid (ZYVOX) IV        LOS: 4 days    Time spent: 48 minutes  spent on chart review, discussion with nursing staff, consultants, updating family and interview/physical exam; more than 50% of that time was spent in counseling and/or coordination of care.    Alvira Philips [place], DO [facility] Available via Epic secure chat 7am-7pm After these hours, please refer to coverage provider listed on [email or web address] 06/08/2023, 3:19 PM

## 2023-06-08 NOTE — TOC Progression Note (Signed)
 Transition of Care [facility]) - Progression Note    Patient Details  Name: David Mcgrath MRN: [id number] Date of Birth: [birth year]  [facility] of [facility]) CM/SW Contact  Adrian Prows, RN Phone Number: 06/08/2023, 4:34 PM  Clinical Narrative:    Sherron Monday w/ pt and wife; they discuss PT recc for SNF; explained SNF process and ins auth; will follow up in am.  Expected Discharge Plan: Home/Self Care Barriers to Discharge: Continued Medical Work up  Expected Discharge Plan and Services                                               Social Determinants of Health (SDOH) Interventions SDOH Screenings   Food Insecurity: No Food Insecurity (06/04/2023)  Housing: Low Risk  (06/04/2023)  Transportation Needs: No Transportation Needs (06/04/2023)  Utilities: Not At Risk (06/04/2023)  Social Connections: Socially Integrated (06/04/2023)  Tobacco Use: Low Risk  (06/04/2023)    Readmission Risk Interventions     No data to display

## 2023-06-09 DIAGNOSIS — J111 Influenza due to unidentified influenza virus with other respiratory manifestations: Secondary | ICD-10-CM

## 2023-06-09 DIAGNOSIS — J9601 Acute respiratory failure with hypoxia: Secondary | ICD-10-CM

## 2023-06-09 DIAGNOSIS — E785 Hyperlipidemia, unspecified: Secondary | ICD-10-CM

## 2023-06-09 DIAGNOSIS — N179 Acute kidney failure, unspecified: Secondary | ICD-10-CM | POA: Diagnosis not present

## 2023-06-09 DIAGNOSIS — K567 Ileus, unspecified: Secondary | ICD-10-CM

## 2023-06-09 DIAGNOSIS — K760 Fatty (change of) liver, not elsewhere classified: Secondary | ICD-10-CM

## 2023-06-09 DIAGNOSIS — R6520 Severe sepsis without septic shock: Secondary | ICD-10-CM

## 2023-06-09 DIAGNOSIS — I10 Essential (primary) hypertension: Secondary | ICD-10-CM

## 2023-06-09 DIAGNOSIS — A419 Sepsis, unspecified organism: Secondary | ICD-10-CM | POA: Diagnosis not present

## 2023-06-09 DIAGNOSIS — R197 Diarrhea, unspecified: Secondary | ICD-10-CM

## 2023-06-09 LAB — CBC
HCT: 32.8 % — ABNORMAL LOW (ref 39.0–52.0)
Hemoglobin: 10.9 g/dL — ABNORMAL LOW (ref 13.0–17.0)
MCH: 32 pg (ref 26.0–34.0)
MCHC: 33.2 g/dL (ref 30.0–36.0)
MCV: 96.2 fL (ref 80.0–100.0)
Platelets: 244 10*3/uL (ref 150–400)
RBC: 3.41 MIL/uL — ABNORMAL LOW (ref 4.22–5.81)
RDW: 13.9 % (ref 11.5–15.5)
WBC: 11.8 10*3/uL — ABNORMAL HIGH (ref 4.0–10.5)
nRBC: 0 % (ref 0.0–0.2)

## 2023-06-09 LAB — CULTURE, BLOOD (ROUTINE X 2): Culture: NO GROWTH

## 2023-06-09 LAB — COMPREHENSIVE METABOLIC PANEL
ALT: 46 U/L — ABNORMAL HIGH (ref 0–44)
AST: 57 U/L — ABNORMAL HIGH (ref 15–41)
Albumin: 2.3 g/dL — ABNORMAL LOW (ref 3.5–5.0)
Alkaline Phosphatase: 47 U/L (ref 38–126)
Anion gap: 10 (ref 5–15)
BUN: 15 mg/dL (ref 8–23)
CO2: 20 mmol/L — ABNORMAL LOW (ref 22–32)
Calcium: 8.2 mg/dL — ABNORMAL LOW (ref 8.9–10.3)
Chloride: 107 mmol/L (ref 98–111)
Creatinine, Ser: 0.83 mg/dL (ref 0.61–1.24)
GFR, Estimated: >60 mL/min (ref >60)
Glucose, Bld: 70 mg/dL (ref 70–99)
Potassium: 4 mmol/L (ref 3.5–5.1)
Sodium: 137 mmol/L (ref 135–145)
Total Bilirubin: 1.1 mg/dL (ref 0.0–1.2)
Total Protein: 5.5 g/dL — ABNORMAL LOW (ref 6.5–8.1)

## 2023-06-09 LAB — PROCALCITONIN: Procalcitonin: 0.39 ng/mL

## 2023-06-09 LAB — MRSA NEXT GEN BY PCR, NASAL: MRSA by PCR Next Gen: NOT DETECTED

## 2023-06-09 MED ORDER — PREDNISONE 20 MG PO TABS
40.0000 mg | ORAL_TABLET | Freq: Every day | ORAL | Status: DC
Start: 2023-06-10 — End: 2023-06-09

## 2023-06-09 MED ORDER — BUDESONIDE 0.5 MG/2ML IN SUSP
2.0000 mg | Freq: Two times a day (BID) | RESPIRATORY_TRACT | Status: DC
  Administered 2023-06-09 – 2023-06-10 (×2): 2 mg via RESPIRATORY_TRACT
  Administered 2023-06-10 – 2023-06-11 (×2): 0.5 mg via RESPIRATORY_TRACT
  Filled 2023-06-09 (×4): qty 8

## 2023-06-09 MED ORDER — PREDNISONE 20 MG PO TABS
40.0000 mg | ORAL_TABLET | Freq: Every day | ORAL | Status: AC
  Administered 2023-06-09 – 2023-06-13 (×5): 40 mg via ORAL
  Filled 2023-06-09 (×5): qty 2

## 2023-06-09 NOTE — [place] FL2 (Signed)
 [place] MEDICAID [facility] LEVEL OF CARE FORM     IDENTIFICATION  Patient Name: David Mcgrath Birthdate: 11-13-1950 Sex: male Admission Date (Current Location): 06/04/2023  [facility] and [place] Number:  Producer, television/film/video and Address:  [facility],  [address] [place], [place] [address]      Provider Number: [id number]  Attending Physician Name and Address:  Azucena Fallen, MD  Relative Name and Phone Number:  Pradeep Beaubrun (spouse) [phone]    Current Level of Care: Hospital Recommended Level of Care: Skilled Nursing Facility Prior Approval Number:    Date Approved/Denied:   PASRR Number: [id number]  Discharge Plan: SNF    Current Diagnoses: Patient Active Problem List   Diagnosis Date Noted   Pneumonia 06/04/2023   Ileus (HCC) 06/04/2023   Acute hypoxic respiratory failure (HCC) 06/04/2023   Flu 06/04/2023   Essential hypertension 06/04/2023   Fatty liver 06/04/2023   AKI (acute kidney injury) (HCC) 06/04/2023   Hyperlipidemia 06/04/2023   Diarrhea 06/04/2023   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/04/2018   Depression 02/25/2014    Orientation RESPIRATION BLADDER Height & Weight     Self, Time, Situation  Normal External catheter Weight: 113.5 kg Height:  6\' 4"  (193 cm)  BEHAVIORAL SYMPTOMS/MOOD NEUROLOGICAL BOWEL NUTRITION STATUS      Incontinent Diet (soft)  AMBULATORY STATUS COMMUNICATION OF NEEDS Skin   Limited Assist Verbally Bruising, Other (Comment) (ecchymosis bilateral arms; erythema bilateral buttocks)                       Personal Care Assistance Level of Assistance  Bathing, Feeding, Dressing Bathing Assistance: Limited assistance Feeding assistance: Independent Dressing Assistance: Limited assistance     Functional Limitations Info  Sight, Hearing, Speech Sight Info: Impaired (glasses) Hearing Info: Adequate Speech Info: Adequate    SPECIAL CARE FACTORS FREQUENCY  PT (By licensed PT), OT (By licensed  OT)     PT Frequency: 5x/week OT Frequency: 5x/week            Contractures Contractures Info: Not present    Additional Factors Info  Code Status, Allergies Code Status Info: Full Allergies Info: Shrimp (Shellfish Allergy)           Current Medications (06/09/2023):  This is the current hospital active medication list Current Facility-Administered Medications  Medication Dose Route Frequency Provider Last Rate Last Admin   acetaminophen (TYLENOL) tablet 650 mg  650 mg Oral Q6H PRN Foust, Katy L, NP   650 mg at 06/09/23 1629   Or   acetaminophen (TYLENOL) suppository 650 mg  650 mg Rectal Q6H PRN Foust, Katy L, NP       albuterol (PROVENTIL) (2.5 MG/3ML) 0.083% nebulizer solution 2.5 mg  2.5 mg Nebulization Q4H PRN Foust, Katy L, NP       benzonatate (TESSALON) capsule 200 mg  200 mg Oral TID PRN Foust, Katy L, NP   200 mg at 06/09/23 1616   budesonide (PULMICORT) nebulizer solution 2 mg  2 mg Nebulization Q12H Azucena Fallen, MD       ceFEPIme (MAXIPIME) 2 g in sodium chloride 0.9 % 100 mL IVPB  2 g Intravenous Q8H Lynden Ang, RPH 200 mL/hr at 06/09/23 1047 2 g at 06/09/23 1047   docusate sodium (COLACE) capsule 100 mg  100 mg Oral BID PRN Foust, Katy L, NP       enoxaparin (LOVENOX) injection 40 mg  40 mg Subcutaneous QHS Foust, Katy L,  NP   40 mg at 06/08/23 2100   feeding supplement (BOOST / RESOURCE BREEZE) liquid 1 Container  1 Container Oral TID BM [place], Alvira Philips, DO   1 Container at 06/09/23 1046   gabapentin (NEURONTIN) capsule 600 mg  600 mg Oral BID Foust, Katy L, NP   600 mg at 06/09/23 1045   guaiFENesin (MUCINEX) 12 hr tablet 600 mg  600 mg Oral BID Foust, Katy L, NP   600 mg at 06/09/23 1047   hydrALAZINE (APRESOLINE) injection 5 mg  5 mg Intravenous Q6H PRN Foust, Katy L, NP       HYDROcodone bit-homatropine (HYCODAN) 5-1.5 MG/5ML syrup 5 mL  5 mL Oral Q6H PRN [place], Alvira Philips, DO   5 mL at 06/09/23 0556   linezolid (ZYVOX) IVPB 600 mg  600 mg Intravenous  Q12H [place], Eric J, DO 300 mL/hr at 06/09/23 0558 600 mg at 06/09/23 0558   melatonin tablet 5 mg  5 mg Oral QHS PRN Foust, Katy L, NP   5 mg at 06/07/23 2144   ondansetron (ZOFRAN) tablet 4 mg  4 mg Oral Q6H PRN Foust, Katy L, NP       Or   ondansetron (ZOFRAN) injection 4 mg  4 mg Intravenous Q6H PRN Foust, Katy L, NP   4 mg at 06/09/23 1057   pantoprazole (PROTONIX) EC tablet 40 mg  40 mg Oral Daily Foust, Katy L, NP   40 mg at 06/09/23 1045   predniSONE (DELTASONE) tablet 40 mg  40 mg Oral Q breakfast Azucena Fallen, MD         Discharge Medications: Please see discharge summary for a list of discharge medications.  Relevant Imaging Results:  Relevant Lab Results:   Additional Information [id number]  Adrian Prows, RN

## 2023-06-09 NOTE — Progress Notes (Signed)
 PROGRESS NOTE    David Mcgrath  YQM:[id number] DOB: 24-Nov-1950 DOA: 06/04/2023 PCP: Charlane Ferretti, DO   Brief Narrative:  David Mcgrath is a 73 y.o. male with past medical history significant for basal cell carcinoma, HTN, HLD, neuropathy, gout, anxiety/depression, GERD who presented to [facility], ED on 06/04/2023 with an acute exacerbation of what appears to be a somewhat chronic and progressive dyspnea.  Wife indicates upwards of 9 months of respiratory symptoms most notably worsening over the past week with associated fatigue, poor oral intake, right-sided abdominal pain, nausea/vomiting/diarrhea. He was seen by his PCP and most recently Pulmonology Marchelle Gearing) in Jan 2025 for his cough. Symptoms were attributed to bronchitis and patient has been given steroids and antibiotics, he reports have these have not helped. Denies chest pain, palpitations, fevers, chills, or known sick contacts.   Assessment & Plan: Principal Problem:   Pneumonia Active Problems:   Ileus (HCC)   Acute hypoxic respiratory failure (HCC)   Flu   Essential hypertension   Fatty liver   AKI (acute kidney injury) (HCC)   Hyperlipidemia   Diarrhea  Acute Hypoxic Respiratory Failure Influenza A Aspiration Pneumonia Severe sepsis secondary to above Rule out underlying COPD -Noted febrile tachypnea, leukocytosis with obvious pneumonia on imaging -Acute hypoxia with AKI meeting criteria for severe sepsis -Continue supportive care, previously treated with azithromycin in the outpatient setting with no improvement as well as steroid taper recently for concern over underlying COPD -Continue linezolid, cefepime given previous failure of azithromycin -Supportive care, nebs, incentive spirometry, flutter as tolerated -No indication for Tamiflu given patient is outside the window for treatment -Remote history of smoking, unlikely underlying COPD however given chronicity of patient's respiratory symptoms as  above for "months" we will initiate inhaled and systemic steroids   Acute ileus:  Repeat CT abdomen suggestive of ileus with no clear transition point for obstruction -contrast noted in the colon -Worsening nausea ongoing today with poor appetite -Continue supportive care Zofran 4mg  IV q6h PRN    Acute Kidney Injury: resolved Without history of CKD  Peak creatinine and intake 1.95, downtrending now within normal limits Hold home ARB   Hepatic Steatosis Elevated LFTs -- AST 96>79>59>62 -- ALT 49>43>41>47 -- Tbili 0.6>0.8>0.5 -- Hold home statin -Stabilizing, follow-up outpatient for repeat labs as appropriate   Hyperlipidemia --Holding statin   Diarrhea, improving Continues to have soft stool but no further watery/liquid stool Questionable ileus as above likely complicating his regimen Early ambulation   Hypertension -Hold ARB given above -- PRN hydralazine   Anxiety/depression: -- Prozac 20 mg p.o. twice daily   Neuropathy -- Gabapentin 600 mg p.o. 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  Recent colonoscopy with no concerning findings per patient.  CT chest, abdomen/pelvis with no findings suggestive of primary malignancy.  Alkaline phosphatase within normal limits.  Unclear etiology of osseous lesions.  Would likely benefit from outpatient follow-up with PCP/oncology and would likely need outpatient biopsy, PET scan for further workup. -- SPEP/UPEP ordered and pending   Weakness/debility/deconditioning: - PT/OT currently recommending SNF placement - TOC consulted  DVT prophylaxis: enoxaparin (LOVENOX) injection 40 mg Start: 06/04/23 2200 Code Status:   Code Status: Full Code Family Communication: Wife at bedside  Status is: Inpatient  Dispo: The patient is from: Home              Anticipated d/c is to: To be determined, likely  SNF              Anticipated d/c date is: 48 to 72  hours              Patient currently not medically stable for discharge  Consultants:  None  Procedures:  None  Antimicrobials:  Linezolid, cefepime x 5 days: Consider 7-day course if no improvement  Subjective: No acute issues or events overnight, continues to complain of lethargy fatigue weakness and shortness of breath, worse with exertion.  Denies orthopnea nausea vomiting constipation headache fevers or chills.  Questionable diarrhea versus soft loose stool overnight.  Objective: Vitals:   06/08/23 0706 06/08/23 1137 06/08/23 2029 06/09/23 0537  BP: 134/64 (!) 141/65 120/73 (!) 154/76  Pulse: 97 87 96 (!) 111  Resp: 20  20 18   Temp: 98.7 F (37.1 C) 98.7 F (37.1 C) 99.7 F (37.6 C) 98.6 F (37 C)  TempSrc: Oral Oral    SpO2: 96% 93% 94% 91%  Weight:      Height:        Intake/Output Summary (Last 24 hours) at 06/09/2023 0759 Last data filed at 06/09/2023 0549 Gross per 24 hour  Intake 699.87 ml  Output 1275 ml  Net -575.13 ml   Filed Weights   06/07/23 1820  Weight: 113.5 kg    Examination:  General:  Pleasantly resting in bed, No acute distress. HEENT:  Normocephalic atraumatic.  Sclerae nonicteric, noninjected.  Extraocular movements intact bilaterally. Neck:  Without mass or deformity.  Trachea is midline. Lungs: Diffuse rhonchi bilaterally. Heart:  Regular rate and rhythm.  Without murmurs, rubs, or gallops. Abdomen:  Soft, nontender, nondistended.  Without guarding or rebound. Extremities: Without cyanosis, clubbing, edema, or obvious deformity. Skin:  Warm and dry, no erythema.  Data Reviewed: I have personally reviewed following labs and imaging studies  CBC: Recent Labs  Lab 06/04/23 1009 06/05/23 0523 06/06/23 0535 06/07/23 0533 06/08/23 0520 06/09/23 0458  WBC 7.1 12.3* 6.9 7.7 9.8 11.8*  NEUTROABS 6.2  --   --   --   --   --   HGB 12.5* 10.2* 10.3* 10.5* 11.0* 10.9*  HCT 38.0* 31.7* 31.9* 32.7* 33.7* 32.8*  MCV 96.2 99.4 100.0 99.7  96.8 96.2  PLT 224 186 177 194 238 244   Basic Metabolic Panel: Recent Labs  Lab 06/04/23 1439 06/05/23 0523 06/06/23 0535 06/07/23 0533 06/08/23 0520 06/09/23 0458  NA  --  135 134* 135 138 137  K  --  4.3 3.9 3.8 4.3 4.0  CL  --  108 108 109 108 107  CO2  --  18* 17* 18* 19* 20*  GLUCOSE  --  69* 61* 105* 78 70  BUN  --  42* 28* 19 17 15   CREATININE  --  2.11* 1.06 0.85 0.87 0.83  CALCIUM  --  7.5* 7.6* 7.9* 8.5* 8.2*  MG 2.0 1.9 2.1  --   --   --   PHOS  --   --  2.2*  --   --   --    GFR: Estimated Creatinine Clearance: 112.6 mL/min (by C-G formula based on SCr of 0.83 mg/dL). Liver Function Tests: Recent Labs  Lab 06/05/23 0523 06/06/23 0535 06/07/23 0533 06/08/23 0520 06/09/23 0458  AST 79* 71* 59* 62* 57*  ALT 43 40 41 47* 46*  ALKPHOS 38 37* 40 43 47  BILITOT 0.8 0.6 0.5 0.7 1.1  PROT 5.1* 4.8* 5.0* 5.4* 5.5*  ALBUMIN 2.4* 2.2* 2.2*  2.4* 2.3*   Sepsis Labs: Recent Labs  Lab 06/04/23 1346 06/04/23 1439 06/06/23 0535 06/09/23 0458  PROCALCITON  --  0.16 2.98 0.39  LATICACIDVEN 1.5  --   --   --     Recent Results (from the past 240 hours)  Resp panel by RT-PCR (RSV, Flu A&B, Covid) Anterior Nasal Swab     Status: Abnormal   Collection Time: 06/04/23 10:17 AM   Specimen: Anterior Nasal Swab  Result Value Ref Range Status   SARS Coronavirus 2 by RT PCR NEGATIVE NEGATIVE Final    Comment: (NOTE) SARS-CoV-2 target nucleic acids are NOT DETECTED.  The SARS-CoV-2 RNA is generally detectable in upper respiratory specimens during the acute phase of infection. The lowest concentration of SARS-CoV-2 viral copies this assay can detect is 138 copies/mL. A negative result does not preclude SARS-Cov-2 infection and should not be used as the sole basis for treatment or other patient management decisions. A negative result may occur with  improper specimen collection/handling, submission of specimen other than nasopharyngeal swab, presence of viral mutation(s)  within the areas targeted by this assay, and inadequate number of viral copies(<138 copies/mL). A negative result must be combined with clinical observations, patient history, and epidemiological information. The expected result is Negative.  Fact Sheet for Patients:  BloggerCourse.com  Fact Sheet for Healthcare Providers:  SeriousBroker.it  This test is no t yet approved or cleared by the [place] FDA and  has been authorized for detection and/or diagnosis of SARS-CoV-2 by FDA under an Emergency Use Authorization (EUA). This EUA will remain  in effect (meaning this test can be used) for the duration of the COVID-19 declaration under Section 564(b)(1) of the Act, 21 U.S.C.section 360bbb-3(b)(1), unless the authorization is terminated  or revoked sooner.       Influenza A by PCR POSITIVE (A) NEGATIVE Final   Influenza B by PCR NEGATIVE NEGATIVE Final    Comment: (NOTE) The Xpert Xpress SARS-CoV-2/FLU/RSV plus assay is intended as an aid in the diagnosis of influenza from Nasopharyngeal swab specimens and should not be used as a sole basis for treatment. Nasal washings and aspirates are unacceptable for Xpert Xpress SARS-CoV-2/FLU/RSV testing.  Fact Sheet for Patients: BloggerCourse.com  Fact Sheet for Healthcare Providers: SeriousBroker.it  This test is not yet approved or cleared by the [place] FDA and has been authorized for detection and/or diagnosis of SARS-CoV-2 by FDA under an Emergency Use Authorization (EUA). This EUA will remain in effect (meaning this test can be used) for the duration of the COVID-19 declaration under Section 564(b)(1) of the Act, 21 U.S.C. section 360bbb-3(b)(1), unless the authorization is terminated or revoked.     Resp Syncytial Virus by PCR NEGATIVE NEGATIVE Final    Comment: (NOTE) Fact Sheet for  Patients: BloggerCourse.com  Fact Sheet for Healthcare Providers: SeriousBroker.it  This test is not yet approved or cleared by the [place] FDA and has been authorized for detection and/or diagnosis of SARS-CoV-2 by FDA under an Emergency Use Authorization (EUA). This EUA will remain in effect (meaning this test can be used) for the duration of the COVID-19 declaration under Section 564(b)(1) of the Act, 21 U.S.C. section 360bbb-3(b)(1), unless the authorization is terminated or revoked.  Performed at [facility], [address], [place], [place] [address]   Culture, blood (routine x 2)     Status: Abnormal   Collection Time: 06/04/23  2:44 PM   Specimen: BLOOD  Result Value Ref Range Status  Specimen Description   Final    BLOOD RIGHT ANTECUBITAL Performed at [facility], [address], [place], [place] [address]    Special Requests   Final    BOTTLES DRAWN AEROBIC AND ANAEROBIC Blood Culture adequate volume Performed at [facility], [address], [place], [place] [address]    Culture  Setup Time   Final    GRAM POSITIVE COCCI IN CLUSTERS AEROBIC BOTTLE ONLY CRITICAL RESULT CALLED TO, READ BACK BY AND VERIFIED WITH: PHARMD SYDNEY DAVIS [id number] AT 1556 BY EC    Culture (A)  Final    STAPHYLOCOCCUS EPIDERMIDIS THE SIGNIFICANCE OF ISOLATING THIS ORGANISM FROM A SINGLE SET OF BLOOD CULTURES WHEN MULTIPLE SETS ARE DRAWN IS UNCERTAIN. PLEASE NOTIFY THE MICROBIOLOGY DEPARTMENT WITHIN ONE WEEK IF SPECIATION AND SENSITIVITIES ARE REQUIRED. Performed at [facility], [address], [place], [place] [address]    Report Status 06/06/2023 FINAL  Final  Blood Culture ID Panel (Reflexed)     Status: Abnormal   Collection Time: 06/04/23  2:44 PM  Result Value Ref Range Status   Enterococcus faecalis NOT DETECTED NOT DETECTED Final   Enterococcus Faecium NOT  DETECTED NOT DETECTED Final   Listeria monocytogenes NOT DETECTED NOT DETECTED Final   Staphylococcus species DETECTED (A) NOT DETECTED Final    Comment: CRITICAL RESULT CALLED TO, READ BACK BY AND VERIFIED WITH: PHARMD SYDNEY DAVIS [id number] AT 1556Y EC    Staphylococcus aureus (BCID) NOT DETECTED NOT DETECTED Final   Staphylococcus epidermidis DETECTED (A) NOT DETECTED Final    Comment: Methicillin (oxacillin) resistant coagulase negative staphylococcus. Possible blood culture contaminant (unless isolated from more than one blood culture draw or clinical case suggests pathogenicity). No antibiotic treatment is indicated for blood  culture contaminants. CRITICAL RESULT CALLED TO, READ BACK BY AND VERIFIED WITH: PHARMD SYDNEY DAVIS [id number] AT 1556 BY EC    Staphylococcus lugdunensis NOT DETECTED NOT DETECTED Final   Streptococcus species NOT DETECTED NOT DETECTED Final   Streptococcus agalactiae NOT DETECTED NOT DETECTED Final   Streptococcus pneumoniae NOT DETECTED NOT DETECTED Final   Streptococcus pyogenes NOT DETECTED NOT DETECTED Final   A.calcoaceticus-baumannii NOT DETECTED NOT DETECTED Final   Bacteroides fragilis NOT DETECTED NOT DETECTED Final   Enterobacterales NOT DETECTED NOT DETECTED Final   Enterobacter cloacae complex NOT DETECTED NOT DETECTED Final   Escherichia coli NOT DETECTED NOT DETECTED Final   Klebsiella aerogenes NOT DETECTED NOT DETECTED Final   Klebsiella oxytoca NOT DETECTED NOT DETECTED Final   Klebsiella pneumoniae NOT DETECTED NOT DETECTED Final   Proteus species NOT DETECTED NOT DETECTED Final   Salmonella species NOT DETECTED NOT DETECTED Final   Serratia marcescens NOT DETECTED NOT DETECTED Final   Haemophilus influenzae NOT DETECTED NOT DETECTED Final   Neisseria meningitidis NOT DETECTED NOT DETECTED Final   Pseudomonas aeruginosa NOT DETECTED NOT DETECTED Final   Stenotrophomonas maltophilia NOT DETECTED NOT DETECTED Final   Candida albicans NOT  DETECTED NOT DETECTED Final   Candida auris NOT DETECTED NOT DETECTED Final   Candida glabrata NOT DETECTED NOT DETECTED Final   Candida krusei NOT DETECTED NOT DETECTED Final   Candida parapsilosis NOT DETECTED NOT DETECTED Final   Candida tropicalis NOT DETECTED NOT DETECTED Final   Cryptococcus neoformans/gattii NOT DETECTED NOT DETECTED Final   Methicillin resistance mecA/C DETECTED (A) NOT DETECTED Final    Comment: CRITICAL RESULT CALLED TO, READ BACK BY AND VERIFIED WITH: PHARMD SYDNEY DAVIS [id number] AT 1556 BY EC  Performed at [facility], [address] [place], [place] [address]   Culture, blood (routine x 2)     Status: None (Preliminary result)   Collection Time: 06/04/23  2:47 PM   Specimen: BLOOD  Result Value Ref Range Status   Specimen Description   Final    BLOOD LEFT ANTECUBITAL Performed at [facility], [address], [place], [place] [address]    Special Requests   Final    BOTTLES DRAWN AEROBIC AND ANAEROBIC Blood Culture results may not be optimal due to an inadequate volume of blood received in culture bottles Performed at [facility], [address], [place], [place] [address]    Culture   Final    NO GROWTH 4 DAYS Performed at [facility], [address], [place], [place] [address]    Report Status PENDING  Incomplete         Radiology Studies: CT CHEST ABDOMEN PELVIS W CONTRAST Result Date: 06/08/2023 CLINICAL DATA:  Abdominal pain. Dyspnea. No lytic bone lesions. * Tracking Code: BO * EXAM: CT CHEST, ABDOMEN, AND PELVIS WITH CONTRAST TECHNIQUE: Multidetector CT imaging of the chest, abdomen and pelvis was performed following the standard protocol during bolus administration of intravenous contrast. RADIATION DOSE REDUCTION: This exam was performed according to the departmental dose-optimization program which includes automated exposure control, adjustment of the mA and/or kV according to patient size  and/or use of iterative reconstruction technique. CONTRAST:  OMNIPAQUE IOHEXOL 300 MG/ML  SOLN COMPARISON:  CTA chest 06/04/2023. Standard abdomen pelvis CT with contrast 06/04/2023. FINDINGS: CT CHEST FINDINGS Cardiovascular: Heart is nonenlarged. No pericardial effusion. Coronary artery calcifications are seen. There are calcifications along the aortic valve. The thoracic aorta is normal course and caliber. There is a bovine type aortic arch. Mediastinum/Nodes: Small thyroid gland. Slightly patulous thoracic esophagus. No specific abnormal lymph node enlargement identified in the axillary region or hila. There are some borderline mediastinal nodes identified, unchanged from the previous examination. Example precarinal node has a short axis of 10 mm. Small subcarinal and paratracheal nodes. Lungs/Pleura: No pneumothorax. Developing tiny right pleural effusion greater than left. Extensive breathing motion throughout the examination. Once again there are areas of ground-glass opacities in the upper lobes which are increasing. There is also ill-defined parenchymal opacity along the lower lobes. The left lower lobe opacity is slightly less confluent and consolidative but there is significant residual. Similar changes along the right lower lobe. Musculoskeletal: Scattered degenerative changes along the spine. There are several scattered lucent bone lesions identified. Examples include the posterior aspect of the left fourth rib, posterolateral aspect of the right sixth rib. Also some areas along the thoracic spine such as right transverse process of T1. Please correlate for any known history of malignancy or myeloma. CT ABDOMEN PELVIS FINDINGS Hepatobiliary: No space-occupying liver lesion. Gallbladder is distended with some high density material, possible vicarious excretion of previous contrast administration. Patent portal vein. Pancreas: Mild global pancreatic atrophy.  No obvious mass. Spleen: Spleen is not  enlarged. Adrenals/Urinary Tract: Adrenal glands are unremarkable. Kidneys are normal, without renal calculi, focal lesion, or hydronephrosis. Bladder is unremarkable. Stomach/Bowel: Oral contrast was administered. Stomach is nondilated. Once again there are some dilated loops of small bowel in left mid abdomen. Few air-fluid levels. Loops measure up to 4 cm in diameter. Oral contrast is seen extending into some portions of the colon. There are decompressed loops in the right mid abdomen of small bowel. Large bowel  is nondilated. Vascular/Lymphatic: Aortic atherosclerosis. No enlarged abdominal or pelvic lymph nodes. Reproductive: Prostate is unremarkable. Left testicle is along the left inguinal region. Other: Anasarca. Motion. There is a left-sided posterior abdominal wall hernia at the level of the left kidney containing fat. Musculoskeletal: Degenerative changes along the spine. Once again there are some scattered lytic lesions along the pelvis and lumbar spine. IMPRESSION: Once again scattered bony lytic lesions identified diffusely. Please correlate for any known history including an no malignancy such as myeloma. Changing lung opacities. Increasing ground-glass areas along both upper lungs. The confluent areas in the lower lobes are less confluent today but there is significant residual. There are increasing small pleural effusions, right-greater-than-left. Persistent mildly dilated loops of small bowel in the left midabdomen. No abrupt transition and there is some contrast crossing into the colon. There are decompressed loops of small bowel in the right lower quadrant. Please correlate for ileus or very low-grade obstruction. Recommend continued follow up surveillance. Evaluation limited by motion. Electronically Signed   By: Karen Kays M.D.   On: 06/08/2023 14:07   Scheduled Meds:  enoxaparin (LOVENOX) injection  40 mg Subcutaneous QHS   feeding supplement  1 Container Oral TID BM   gabapentin  600  mg Oral BID   guaiFENesin  600 mg Oral BID   pantoprazole  40 mg Oral Daily   Continuous Infusions:  ceFEPime (MAXIPIME) IV Stopped (06/09/23 0157)   linezolid (ZYVOX) IV 600 mg (06/09/23 0558)     LOS: 5 days   Time spent:  Azucena Fallen, DO [facility]  If 7PM-7AM, please contact night-coverage [email or web address]  06/09/2023, 7:59 AM

## 2023-06-09 NOTE — Plan of Care (Signed)

## 2023-06-09 NOTE — Plan of Care (Signed)

## 2023-06-09 NOTE — TOC Progression Note (Addendum)
 Transition of Care [facility]) - Progression Note    Patient Details  Name: David Mcgrath MRN: [id number] Date of Birth: 1950-09-07  Transition of Care [facility]) CM/SW Contact  Adrian Prows, RN Phone Number: 06/09/2023, 1:49 PM  Clinical Narrative:    Sherron Monday w/ pt's wife [name]; she would like for pt to go to recc SNF; she does not have a facility preference; explained SNF process and ins auth needed; she verbalized understanding; obtained PASRR # [id number]; faxed out; awaiting bed offers; ins auth.   Expected Discharge Plan: Home/Self Care Barriers to Discharge: Continued Medical Work up  Expected Discharge Plan and Services                                               Social Determinants of Health (SDOH) Interventions SDOH Screenings   Food Insecurity: No Food Insecurity (06/04/2023)  Housing: Low Risk  (06/04/2023)  Transportation Needs: No Transportation Needs (06/04/2023)  Utilities: Not At Risk (06/04/2023)  Social Connections: Socially Integrated (06/04/2023)  Tobacco Use: Low Risk  (06/04/2023)    Readmission Risk Interventions     No data to display

## 2023-06-10 DIAGNOSIS — A419 Sepsis, unspecified organism: Secondary | ICD-10-CM | POA: Diagnosis not present

## 2023-06-10 DIAGNOSIS — J9601 Acute respiratory failure with hypoxia: Secondary | ICD-10-CM | POA: Diagnosis not present

## 2023-06-10 DIAGNOSIS — J111 Influenza due to unidentified influenza virus with other respiratory manifestations: Secondary | ICD-10-CM | POA: Diagnosis not present

## 2023-06-10 DIAGNOSIS — N179 Acute kidney failure, unspecified: Secondary | ICD-10-CM | POA: Diagnosis not present

## 2023-06-10 LAB — CBC
HCT: 34 % — ABNORMAL LOW (ref 39.0–52.0)
Hemoglobin: 11 g/dL — ABNORMAL LOW (ref 13.0–17.0)
MCH: 31.4 pg (ref 26.0–34.0)
MCHC: 32.4 g/dL (ref 30.0–36.0)
MCV: 97.1 fL (ref 80.0–100.0)
Platelets: 291 10*3/uL (ref 150–400)
RBC: 3.5 MIL/uL — ABNORMAL LOW (ref 4.22–5.81)
RDW: 14 % (ref 11.5–15.5)
WBC: 14.7 10*3/uL — ABNORMAL HIGH (ref 4.0–10.5)
nRBC: 0 % (ref 0.0–0.2)

## 2023-06-10 MED ORDER — ALLOPURINOL 300 MG PO TABS
300.0000 mg | ORAL_TABLET | Freq: Every day | ORAL | Status: DC
  Administered 2023-06-10 – 2023-06-15 (×6): 300 mg via ORAL
  Filled 2023-06-10 (×6): qty 1

## 2023-06-10 MED ORDER — FLUOXETINE HCL 20 MG PO CAPS
20.0000 mg | ORAL_CAPSULE | Freq: Two times a day (BID) | ORAL | Status: DC
  Administered 2023-06-10 – 2023-06-15 (×7): 20 mg via ORAL
  Filled 2023-06-10 (×10): qty 1

## 2023-06-10 NOTE — Progress Notes (Signed)
 PROGRESS NOTE    David Mcgrath  HQI:[id number] DOB: 08-May-1950 DOA: 06/04/2023 PCP: Charlane Ferretti, DO   Brief Narrative:  David Mcgrath is a 73 y.o. male with past medical history significant for basal cell carcinoma, HTN, HLD, neuropathy, gout, anxiety/depression, GERD who presented to [facility], ED on 06/04/2023 with an acute exacerbation of what appears to be a somewhat chronic and progressive dyspnea.  Wife indicates upwards of 9 months of respiratory symptoms most notably worsening over the past week with associated fatigue, poor oral intake, right-sided abdominal pain, nausea/vomiting/diarrhea. He was seen by his PCP and most recently Pulmonology Marchelle Gearing) in Jan 2025 for his cough. Symptoms were attributed to bronchitis and patient has been given steroids and antibiotics, he reports have these have not helped. Denies chest pain, palpitations, fevers, chills, or known sick contacts.   Assessment & Plan: Principal Problem:   Pneumonia Active Problems:   Ileus (HCC)   Acute hypoxic respiratory failure (HCC)   Flu   Essential hypertension   Fatty liver   AKI (acute kidney injury) (HCC)   Hyperlipidemia   Diarrhea  Acute Hypoxic Respiratory Failure Influenza A Aspiration Pneumonia Severe sepsis secondary to above Rule out underlying COPD -Noted febrile tachypnea, leukocytosis with obvious pneumonia on imaging -Acute hypoxia with AKI meeting criteria for severe sepsis -Continue supportive care, previously treated with azithromycin in the outpatient setting with no improvement as well as steroid taper recently for concern over underlying COPD -DC linezolid (MRSA negative), continue cefepime given previous failure of azithromycin -Supportive care, nebs, incentive spirometry, flutter as tolerated -No indication for Tamiflu given patient is outside the window for treatment -Remote history of smoking, unlikely underlying COPD however given chronicity of patient's  respiratory symptoms as above for "months" we will initiate inhaled and systemic steroids -follow-up with Dr. Marchelle Gearing as scheduled outpatient   Acute ileus, improving Diarrhea, improving Repeat CT abdomen suggestive of ileus with no clear transition point for obstruction -contrast noted in the colon -continues to have diarrhea/flatus -Nausea moderately well-controlled at this time on Zofran   Acute Kidney Injury: resolved Without history of CKD  Peak creatinine and intake 1.95, downtrending now within normal limits Hold home ARB -blood pressure currently well-controlled despite being off medication   Hepatic Steatosis Elevated LFTs -- AST 96>79>59>62 -- ALT 49>43>41>47 -- Tbili 0.6>0.8>0.5 -- Hold home statin -Stabilizing, follow-up outpatient for repeat labs as appropriate   Hyperlipidemia --Holding statin  Hypertension -Hold ARB given above, currently well-controlled on as needed hydralazine   Anxiety/depression: Fluoxetine 20 mg twice daily   Neuropathy -- Gabapentin 600 mg p.o. 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  Recent colonoscopy with no concerning findings per patient.  CT chest, abdomen/pelvis with no findings suggestive of primary malignancy.  Alkaline phosphatase within normal limits.  Unclear etiology of osseous lesions.  Would likely benefit from outpatient follow-up with PCP/oncology and would likely need outpatient biopsy, PET scan for further workup. -- SPEP/UPEP ordered and pending   Weakness/debility/deconditioning: - PT/OT currently recommending SNF placement - TOC consulted  DVT prophylaxis: enoxaparin (LOVENOX) injection 40 mg Start: 06/04/23 2200 Code Status:   Code Status: Full Code Family Communication: Wife at bedside  Status is: Inpatient  Dispo: The patient is from: Home              Anticipated d/c is to: To be determined, likely SNF  Anticipated d/c date is: 48 to 72 hours              Patient currently not medically stable for discharge  Consultants:  None  Procedures:  None  Antimicrobials:  cefepime x 5 days: Consider 7-day course if no improvement Linezolid discontinued, MRSA negative  Subjective: No acute issues or events overnight, continues to have episodes of diarrhea but notable fatigue weakness and lethargy are improving, not yet back to baseline by any means but certainly more strength than at admission.  He otherwise endorses nausea without vomiting.  Denies constipation chest pain shortness of breath headache fevers chills.  Objective: Vitals:   06/09/23 2047 06/09/23 2048 06/09/23 2106 06/10/23 0457  BP:  (!) 133/111  122/69  Pulse:  83  70  Resp:  18  20  Temp:    97.9 F (36.6 C)  TempSrc:    Oral  SpO2: (!) 88% (!) 89% (!) 89% 95%  Weight:      Height:        Intake/Output Summary (Last 24 hours) at 06/10/2023 0750 Last data filed at 06/10/2023 0538 Gross per 24 hour  Intake 1497.13 ml  Output 450 ml  Net 1047.13 ml   Filed Weights   06/07/23 1820  Weight: 113.5 kg    Examination:  General:  Pleasantly resting in bed, No acute distress. HEENT:  Normocephalic atraumatic.  Sclerae nonicteric, noninjected.  Extraocular movements intact bilaterally. Neck:  Without mass or deformity.  Trachea is midline. Lungs: Diffuse rhonchi bilaterally. Heart:  Regular rate and rhythm.  Without murmurs, rubs, or gallops. Abdomen:  Soft, nontender, nondistended.  Without guarding or rebound. Extremities: Without cyanosis, clubbing, edema, or obvious deformity. Skin:  Warm and dry, no erythema.  Data Reviewed: I have personally reviewed following labs and imaging studies  CBC: Recent Labs  Lab 06/04/23 1009 06/05/23 0523 06/06/23 0535 06/07/23 0533 06/08/23 0520 06/09/23 0458  WBC 7.1 12.3* 6.9 7.7 9.8 11.8*  NEUTROABS 6.2  --   --   --   --   --   HGB 12.5* 10.2* 10.3* 10.5* 11.0* 10.9*   HCT 38.0* 31.7* 31.9* 32.7* 33.7* 32.8*  MCV 96.2 99.4 100.0 99.7 96.8 96.2  PLT 224 186 177 194 238 244   Basic Metabolic Panel: Recent Labs  Lab 06/04/23 1439 06/05/23 0523 06/06/23 0535 06/07/23 0533 06/08/23 0520 06/09/23 0458  NA  --  135 134* 135 138 137  K  --  4.3 3.9 3.8 4.3 4.0  CL  --  108 108 109 108 107  CO2  --  18* 17* 18* 19* 20*  GLUCOSE  --  69* 61* 105* 78 70  BUN  --  42* 28* 19 17 15   CREATININE  --  2.11* 1.06 0.85 0.87 0.83  CALCIUM  --  7.5* 7.6* 7.9* 8.5* 8.2*  MG 2.0 1.9 2.1  --   --   --   PHOS  --   --  2.2*  --   --   --    GFR: Estimated Creatinine Clearance: 112.6 mL/min (by C-G formula based on SCr of 0.83 mg/dL). Liver Function Tests: Recent Labs  Lab 06/05/23 0523 06/06/23 0535 06/07/23 0533 06/08/23 0520 06/09/23 0458  AST 79* 71* 59* 62* 57*  ALT 43 40 41 47* 46*  ALKPHOS 38 37* 40 43 47  BILITOT 0.8 0.6 0.5 0.7 1.1  PROT 5.1* 4.8* 5.0* 5.4* 5.5*  ALBUMIN 2.4* 2.2* 2.2* 2.4* 2.3*   Sepsis  Labs: Recent Labs  Lab 06/04/23 1346 06/04/23 1439 06/06/23 0535 06/09/23 0458  PROCALCITON  --  0.16 2.98 0.39  LATICACIDVEN 1.5  --   --   --     Recent Results (from the past 240 hours)  Resp panel by RT-PCR (RSV, Flu A&B, Covid) Anterior Nasal Swab     Status: Abnormal   Collection Time: 06/04/23 10:17 AM   Specimen: Anterior Nasal Swab  Result Value Ref Range Status   SARS Coronavirus 2 by RT PCR NEGATIVE NEGATIVE Final    Comment: (NOTE) SARS-CoV-2 target nucleic acids are NOT DETECTED.  The SARS-CoV-2 RNA is generally detectable in upper respiratory specimens during the acute phase of infection. The lowest concentration of SARS-CoV-2 viral copies this assay can detect is 138 copies/mL. A negative result does not preclude SARS-Cov-2 infection and should not be used as the sole basis for treatment or other patient management decisions. A negative result may occur with  improper specimen collection/handling, submission of  specimen other than nasopharyngeal swab, presence of viral mutation(s) within the areas targeted by this assay, and inadequate number of viral copies(<138 copies/mL). A negative result must be combined with clinical observations, patient history, and epidemiological information. The expected result is Negative.  Fact Sheet for Patients:  BloggerCourse.com  Fact Sheet for Healthcare Providers:  SeriousBroker.it  This test is no t yet approved or cleared by the [place] FDA and  has been authorized for detection and/or diagnosis of SARS-CoV-2 by FDA under an Emergency Use Authorization (EUA). This EUA will remain  in effect (meaning this test can be used) for the duration of the COVID-19 declaration under Section 564(b)(1) of the Act, 21 U.S.C.section 360bbb-3(b)(1), unless the authorization is terminated  or revoked sooner.       Influenza A by PCR POSITIVE (A) NEGATIVE Final   Influenza B by PCR NEGATIVE NEGATIVE Final    Comment: (NOTE) The Xpert Xpress SARS-CoV-2/FLU/RSV plus assay is intended as an aid in the diagnosis of influenza from Nasopharyngeal swab specimens and should not be used as a sole basis for treatment. Nasal washings and aspirates are unacceptable for Xpert Xpress SARS-CoV-2/FLU/RSV testing.  Fact Sheet for Patients: BloggerCourse.com  Fact Sheet for Healthcare Providers: SeriousBroker.it  This test is not yet approved or cleared by the [place] FDA and has been authorized for detection and/or diagnosis of SARS-CoV-2 by FDA under an Emergency Use Authorization (EUA). This EUA will remain in effect (meaning this test can be used) for the duration of the COVID-19 declaration under Section 564(b)(1) of the Act, 21 U.S.C. section 360bbb-3(b)(1), unless the authorization is terminated or revoked.     Resp Syncytial Virus by PCR NEGATIVE NEGATIVE  Final    Comment: (NOTE) Fact Sheet for Patients: BloggerCourse.com  Fact Sheet for Healthcare Providers: SeriousBroker.it  This test is not yet approved or cleared by the [place] FDA and has been authorized for detection and/or diagnosis of SARS-CoV-2 by FDA under an Emergency Use Authorization (EUA). This EUA will remain in effect (meaning this test can be used) for the duration of the COVID-19 declaration under Section 564(b)(1) of the Act, 21 U.S.C. section 360bbb-3(b)(1), unless the authorization is terminated or revoked.  Performed at [facility], [address], [place], [place] [address]   Culture, blood (routine x 2)     Status: Abnormal   Collection Time: 06/04/23  2:44 PM   Specimen: BLOOD  Result Value Ref Range Status   Specimen Description  Final    BLOOD RIGHT ANTECUBITAL Performed at [facility], [address] [place], [place] [address]    Special Requests   Final    BOTTLES DRAWN AEROBIC AND ANAEROBIC Blood Culture adequate volume Performed at [facility], [address], [place], [place] [address]    Culture  Setup Time   Final    GRAM POSITIVE COCCI IN CLUSTERS AEROBIC BOTTLE ONLY CRITICAL RESULT CALLED TO, READ BACK BY AND VERIFIED WITH: PHARMD SYDNEY DAVIS [id number] AT 1556 BY EC    Culture (A)  Final    STAPHYLOCOCCUS EPIDERMIDIS THE SIGNIFICANCE OF ISOLATING THIS ORGANISM FROM A SINGLE SET OF BLOOD CULTURES WHEN MULTIPLE SETS ARE DRAWN IS UNCERTAIN. PLEASE NOTIFY THE MICROBIOLOGY DEPARTMENT WITHIN ONE WEEK IF SPECIATION AND SENSITIVITIES ARE REQUIRED. Performed at [facility], [address], [place], [place] [address]    Report Status 06/06/2023 FINAL  Final  Blood Culture ID Panel (Reflexed)     Status: Abnormal   Collection Time: 06/04/23  2:44 PM  Result Value Ref Range Status   Enterococcus faecalis NOT DETECTED NOT  DETECTED Final   Enterococcus Faecium NOT DETECTED NOT DETECTED Final   Listeria monocytogenes NOT DETECTED NOT DETECTED Final   Staphylococcus species DETECTED (A) NOT DETECTED Final    Comment: CRITICAL RESULT CALLED TO, READ BACK BY AND VERIFIED WITH: PHARMD SYDNEY DAVIS [id number] AT 1556Y EC    Staphylococcus aureus (BCID) NOT DETECTED NOT DETECTED Final   Staphylococcus epidermidis DETECTED (A) NOT DETECTED Final    Comment: Methicillin (oxacillin) resistant coagulase negative staphylococcus. Possible blood culture contaminant (unless isolated from more than one blood culture draw or clinical case suggests pathogenicity). No antibiotic treatment is indicated for blood  culture contaminants. CRITICAL RESULT CALLED TO, READ BACK BY AND VERIFIED WITH: PHARMD SYDNEY DAVIS [id number] AT 1556 BY EC    Staphylococcus lugdunensis NOT DETECTED NOT DETECTED Final   Streptococcus species NOT DETECTED NOT DETECTED Final   Streptococcus agalactiae NOT DETECTED NOT DETECTED Final   Streptococcus pneumoniae NOT DETECTED NOT DETECTED Final   Streptococcus pyogenes NOT DETECTED NOT DETECTED Final   A.calcoaceticus-baumannii NOT DETECTED NOT DETECTED Final   Bacteroides fragilis NOT DETECTED NOT DETECTED Final   Enterobacterales NOT DETECTED NOT DETECTED Final   Enterobacter cloacae complex NOT DETECTED NOT DETECTED Final   Escherichia coli NOT DETECTED NOT DETECTED Final   Klebsiella aerogenes NOT DETECTED NOT DETECTED Final   Klebsiella oxytoca NOT DETECTED NOT DETECTED Final   Klebsiella pneumoniae NOT DETECTED NOT DETECTED Final   Proteus species NOT DETECTED NOT DETECTED Final   Salmonella species NOT DETECTED NOT DETECTED Final   Serratia marcescens NOT DETECTED NOT DETECTED Final   Haemophilus influenzae NOT DETECTED NOT DETECTED Final   Neisseria meningitidis NOT DETECTED NOT DETECTED Final   Pseudomonas aeruginosa NOT DETECTED NOT DETECTED Final   Stenotrophomonas maltophilia NOT DETECTED  NOT DETECTED Final   Candida albicans NOT DETECTED NOT DETECTED Final   Candida auris NOT DETECTED NOT DETECTED Final   Candida glabrata NOT DETECTED NOT DETECTED Final   Candida krusei NOT DETECTED NOT DETECTED Final   Candida parapsilosis NOT DETECTED NOT DETECTED Final   Candida tropicalis NOT DETECTED NOT DETECTED Final   Cryptococcus neoformans/gattii NOT DETECTED NOT DETECTED Final   Methicillin resistance mecA/C DETECTED (A) NOT DETECTED Final    Comment: CRITICAL RESULT CALLED TO, READ BACK BY AND VERIFIED WITH: PHARMD SYDNEY DAVIS [id number] AT 1556 BY EC Performed at [facility], [address], [place], [place] [address]   Culture, blood (routine x 2)     Status: None   Collection Time: 06/04/23  2:47 PM   Specimen: BLOOD  Result Value Ref Range Status   Specimen Description   Final    BLOOD LEFT ANTECUBITAL Performed at [facility], [address], [place], [place] [address]    Special Requests   Final    BOTTLES DRAWN AEROBIC AND ANAEROBIC Blood Culture results may not be optimal due to an inadequate volume of blood received in culture bottles Performed at [facility], [address] [place], [place] [address]    Culture   Final    NO GROWTH 5 DAYS Performed at [facility], [address], [place], [place] [address]    Report Status 06/09/2023 FINAL  Final  MRSA Next Gen by PCR, Nasal     Status: None   Collection Time: 06/09/23  1:01 PM   Specimen: Nasal Mucosa; Nasal Swab  Result Value Ref Range Status   MRSA by PCR Next Gen NOT DETECTED NOT DETECTED Final    Comment: (NOTE) The GeneXpert MRSA Assay (FDA approved for NASAL specimens only), is one component of a comprehensive MRSA colonization surveillance program. It is not intended to diagnose MRSA infection nor to guide or monitor treatment for MRSA infections. Test performance is not FDA approved in patients less than 32 years old. Performed at [facility], [address], [place], [place] [address]          Radiology Studies: CT CHEST ABDOMEN PELVIS W CONTRAST Result Date: 06/08/2023 CLINICAL DATA:  Abdominal pain. Dyspnea. No lytic bone lesions. * Tracking Code: BO * EXAM: CT CHEST, ABDOMEN, AND PELVIS WITH CONTRAST TECHNIQUE: Multidetector CT imaging of the chest, abdomen and pelvis was performed following the standard protocol during bolus administration of intravenous contrast. RADIATION DOSE REDUCTION: This exam was performed according to the departmental dose-optimization program which includes automated exposure control, adjustment of the mA and/or kV according to patient size and/or use of iterative reconstruction technique. CONTRAST:  OMNIPAQUE IOHEXOL 300 MG/ML  SOLN COMPARISON:  CTA chest 06/04/2023. Standard abdomen pelvis CT with contrast 06/04/2023. FINDINGS: CT CHEST FINDINGS Cardiovascular: Heart is nonenlarged. No pericardial effusion. Coronary artery calcifications are seen. There are calcifications along the aortic valve. The thoracic aorta is normal course and caliber. There is a bovine type aortic arch. Mediastinum/Nodes: Small thyroid gland. Slightly patulous thoracic esophagus. No specific abnormal lymph node enlargement identified in the axillary region or hila. There are some borderline mediastinal nodes identified, unchanged from the previous examination. Example precarinal node has a short axis of 10 mm. Small subcarinal and paratracheal nodes. Lungs/Pleura: No pneumothorax. Developing tiny right pleural effusion greater than left. Extensive breathing motion throughout the examination. Once again there are areas of ground-glass opacities in the upper lobes which are increasing. There is also ill-defined parenchymal opacity along the lower lobes. The left lower lobe opacity is slightly less confluent and consolidative but there is significant residual. Similar changes along the right lower lobe.  Musculoskeletal: Scattered degenerative changes along the spine. There are several scattered lucent bone lesions identified. Examples include the posterior aspect of the left fourth rib, posterolateral aspect of the right sixth rib. Also some areas along the thoracic spine such as right transverse process of T1. Please correlate for any known history of malignancy or myeloma. CT ABDOMEN PELVIS FINDINGS Hepatobiliary: No space-occupying liver  lesion. Gallbladder is distended with some high density material, possible vicarious excretion of previous contrast administration. Patent portal vein. Pancreas: Mild global pancreatic atrophy.  No obvious mass. Spleen: Spleen is not enlarged. Adrenals/Urinary Tract: Adrenal glands are unremarkable. Kidneys are normal, without renal calculi, focal lesion, or hydronephrosis. Bladder is unremarkable. Stomach/Bowel: Oral contrast was administered. Stomach is nondilated. Once again there are some dilated loops of small bowel in left mid abdomen. Few air-fluid levels. Loops measure up to 4 cm in diameter. Oral contrast is seen extending into some portions of the colon. There are decompressed loops in the right mid abdomen of small bowel. Large bowel is nondilated. Vascular/Lymphatic: Aortic atherosclerosis. No enlarged abdominal or pelvic lymph nodes. Reproductive: Prostate is unremarkable. Left testicle is along the left inguinal region. Other: Anasarca. Motion. There is a left-sided posterior abdominal wall hernia at the level of the left kidney containing fat. Musculoskeletal: Degenerative changes along the spine. Once again there are some scattered lytic lesions along the pelvis and lumbar spine. IMPRESSION: Once again scattered bony lytic lesions identified diffusely. Please correlate for any known history including an no malignancy such as myeloma. Changing lung opacities. Increasing ground-glass areas along both upper lungs. The confluent areas in the lower lobes are less  confluent today but there is significant residual. There are increasing small pleural effusions, right-greater-than-left. Persistent mildly dilated loops of small bowel in the left midabdomen. No abrupt transition and there is some contrast crossing into the colon. There are decompressed loops of small bowel in the right lower quadrant. Please correlate for ileus or very low-grade obstruction. Recommend continued follow up surveillance. Evaluation limited by motion. Electronically Signed   By: Karen Kays M.D.   On: 06/08/2023 14:07   Scheduled Meds:  budesonide (PULMICORT) nebulizer solution  2 mg Nebulization Q12H   enoxaparin (LOVENOX) injection  40 mg Subcutaneous QHS   feeding supplement  1 Container Oral TID BM   gabapentin  600 mg Oral BID   guaiFENesin  600 mg Oral BID   pantoprazole  40 mg Oral Daily   predniSONE  40 mg Oral Q breakfast   Continuous Infusions:  ceFEPime (MAXIPIME) IV Stopped (06/10/23 0328)   linezolid (ZYVOX) IV 300 mL/hr at 06/10/23 0538     LOS: 6 days   Time spent:  Azucena Fallen, DO [facility]  If 7PM-7AM, please contact night-coverage [email or web address]  06/10/2023, 7:50 AM

## 2023-06-11 DIAGNOSIS — J9601 Acute respiratory failure with hypoxia: Secondary | ICD-10-CM | POA: Diagnosis not present

## 2023-06-11 DIAGNOSIS — N179 Acute kidney failure, unspecified: Secondary | ICD-10-CM | POA: Diagnosis not present

## 2023-06-11 DIAGNOSIS — A419 Sepsis, unspecified organism: Secondary | ICD-10-CM | POA: Diagnosis not present

## 2023-06-11 DIAGNOSIS — J111 Influenza due to unidentified influenza virus with other respiratory manifestations: Secondary | ICD-10-CM | POA: Diagnosis not present

## 2023-06-11 LAB — LEGIONELLA PNEUMOPHILA SEROGP 1 UR AG: L. pneumophila Serogp 1 Ur Ag: NEGATIVE

## 2023-06-11 LAB — BASIC METABOLIC PANEL
Anion gap: 7 (ref 5–15)
BUN: 20 mg/dL (ref 8–23)
CO2: 20 mmol/L — ABNORMAL LOW (ref 22–32)
Calcium: 8.6 mg/dL — ABNORMAL LOW (ref 8.9–10.3)
Chloride: 106 mmol/L (ref 98–111)
Creatinine, Ser: 0.82 mg/dL (ref 0.61–1.24)
GFR, Estimated: >60 mL/min (ref >60)
Glucose, Bld: 127 mg/dL — ABNORMAL HIGH (ref 70–99)
Potassium: 4.4 mmol/L (ref 3.5–5.1)
Sodium: 133 mmol/L — ABNORMAL LOW (ref 135–145)

## 2023-06-11 MED ORDER — BUDESONIDE 0.5 MG/2ML IN SUSP
0.5000 mg | Freq: Two times a day (BID) | RESPIRATORY_TRACT | Status: DC
  Administered 2023-06-11 – 2023-06-15 (×8): 0.5 mg via RESPIRATORY_TRACT
  Filled 2023-06-11 (×8): qty 2

## 2023-06-11 NOTE — Progress Notes (Signed)
 Encouraged pt to ambulate so nursing could complete ambulating oxygen sat screen. Pt very weak and experiences coughing when ambulatory. Next shift will attempt to ambulate pt if he is feeling better. Kasey Hansell, Yancey Flemings, RN

## 2023-06-11 NOTE — Progress Notes (Signed)
   06/11/23 0007  BiPAP/CPAP/SIPAP  BiPAP/CPAP/SIPAP Pt Type Adult  FiO2 (%) 21 %  Patient Home Equipment Yes

## 2023-06-11 NOTE — Progress Notes (Signed)
 PROGRESS NOTE    David Mcgrath  WGN:[id number] DOB: December 21, 1950 DOA: 06/04/2023 PCP: Charlane Ferretti, DO   Brief Narrative:  David Mcgrath is a 73 y.o. male with past medical history significant for basal cell carcinoma, HTN, HLD, neuropathy, gout, anxiety/depression, GERD who presented to [facility], ED on 06/04/2023 with an acute exacerbation of what appears to be a somewhat chronic and progressive dyspnea.  Wife indicates upwards of 9 months of respiratory symptoms most notably worsening over the past week with associated fatigue, poor oral intake, right-sided abdominal pain, nausea/vomiting/diarrhea. He was seen by his PCP and most recently Pulmonology Marchelle Gearing) in Jan 2025 for his cough. Symptoms were attributed to bronchitis and patient has been given steroids and antibiotics, he reports have these have not helped. Denies chest pain, palpitations, fevers, chills, or known sick contacts.   Assessment & Plan: Principal Problem:   Pneumonia Active Problems:   Ileus (HCC)   Acute hypoxic respiratory failure (HCC)   Flu   Essential hypertension   Fatty liver   AKI (acute kidney injury) (HCC)   Hyperlipidemia   Diarrhea  Acute Hypoxic Respiratory Failure, resolving Influenza A Aspiration Pneumonia Severe sepsis secondary to above Rule out underlying COPD Sleep apnea, obstructive -Noted febrile tachypnea, leukocytosis with obvious pneumonia on imaging -Acute hypoxia with AKI meeting criteria for severe sepsis -Continue supportive care, previously treated with azithromycin in the outpatient setting with no improvement as well as steroid taper recently for concern over underlying COPD -DC linezolid (MRSA negative), continue cefepime given previous failure of azithromycin -Supportive care, nebs, incentive spirometry, flutter as tolerated -No indication for Tamiflu given patient is outside the window for treatment -Remote history of smoking, unlikely underlying COPD however  given chronicity of patient's respiratory symptoms as above for "months" we will initiate inhaled and systemic steroids -follow-up with Dr. Marchelle Gearing as scheduled outpatient -Continue cpap(no oxygen required at baseline) -Encourage early ambulation today, planned ambulatory oxygen screening to plan for discharge oxygen requirements   Acute ileus, improving Diarrhea, improving Repeat CT abdomen suggestive of ileus with no clear transition point for obstruction -contrast noted in the colon -continues to have diarrhea/flatus -Nausea well controlled at this time on Zofran   Acute Kidney Injury: resolved Without history of CKD  Peak creatinine and intake 1.95, downtrending - now within normal limits Hold home ARB -blood pressure currently well-controlled despite being off medication Urine output low over the past 24 hours, encourage p.o. fluids, consider small IV bolus if necessary   Hepatic Steatosis Elevated LFTs -- AST 96>79>59>62 -- ALT 49>43>41>47 -- Tbili 0.6>0.8>0.5 -- Hold home statin -Stabilizing, follow-up outpatient for repeat labs as appropriate   Hyperlipidemia --Holding statin given above  Hypertension -Hold ARB given above, currently well-controlled on as needed hydralazine   Anxiety/depression: Fluoxetine 20 mg twice daily   Neuropathy -- Gabapentin 600 mg p.o. 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  Recent colonoscopy with no concerning findings per patient.  CT chest, abdomen/pelvis with no findings suggestive of primary malignancy.  Alkaline phosphatase within normal limits.  Unclear etiology of osseous lesions.  Would likely benefit from outpatient follow-up with PCP/oncology and would likely need outpatient biopsy, PET scan for further workup. -- SPEP/UPEP ordered and pending   Weakness/debility/deconditioning: - PT/OT currently recommending SNF placement - patient prefers DC  home if at all possible with home health - TOC consulted  DVT prophylaxis: enoxaparin (LOVENOX) injection 40  mg Start: 06/04/23 2200 Code Status:   Code Status: Full Code Family Communication: Wife at bedside  Status is: Inpatient  Dispo: The patient is from: Home              Anticipated d/c is to: To be determined, likely SNF              Anticipated d/c date is: 48 to 72 hours              Patient currently not medically stable for discharge  Consultants:  None  Procedures:  None  Antimicrobials:  cefepime x 5 days Linezolid discontinued, MRSA negative  Subjective: No acute issues or events overnight, diarrhea and fatigue and lethargy appear to be improving but not yet back to baseline.,  Willing to ambulate today more than prior which is a good sign as he would like to discharge home rather than to SNF.  Continues to have profound productive cough otherwise denies nausea vomiting constipation headache fevers or chills.  Objective: Vitals:   06/10/23 2122 06/11/23 0400 06/11/23 0431 06/11/23 0659  BP: (!) 148/81  135/71   Pulse: 65  62 67  Resp: 14  16   Temp: 97.9 F (36.6 C)  97.9 F (36.6 C)   TempSrc:      SpO2: 100% 97% 99% 95%  Weight:      Height:        Intake/Output Summary (Last 24 hours) at 06/11/2023 0742 Last data filed at 06/11/2023 0720 Gross per 24 hour  Intake 720 ml  Output 350 ml  Net 370 ml   Filed Weights   06/07/23 1820  Weight: 113.5 kg    Examination:  General:  Pleasantly resting in bed, No acute distress. HEENT:  Normocephalic atraumatic.  Sclerae nonicteric, noninjected.  Extraocular movements intact bilaterally. Neck:  Without mass or deformity.  Trachea is midline. Lungs: Diffuse rhonchi bilaterally. Heart:  Regular rate and rhythm.  Without murmurs, rubs, or gallops. Abdomen:  Soft, nontender, minimally distended.  Without guarding or rebound. Extremities: Without cyanosis, clubbing, edema, or obvious deformity. Skin:  Warm  and dry, no erythema.  Data Reviewed: I have personally reviewed following labs and imaging studies  CBC: Recent Labs  Lab 06/04/23 1009 06/05/23 0523 06/06/23 0535 06/07/23 0533 06/08/23 0520 06/09/23 0458 06/10/23 1246  WBC 7.1   < > 6.9 7.7 9.8 11.8* 14.7*  NEUTROABS 6.2  --   --   --   --   --   --   HGB 12.5*   < > 10.3* 10.5* 11.0* 10.9* 11.0*  HCT 38.0*   < > 31.9* 32.7* 33.7* 32.8* 34.0*  MCV 96.2   < > 100.0 99.7 96.8 96.2 97.1  PLT 224   < > 177 194 238 244 291   < > = values in this interval not displayed.   Basic Metabolic Panel: Recent Labs  Lab 06/04/23 1439 06/05/23 0523 06/06/23 0535 06/07/23 0533 06/08/23 0520 06/09/23 0458 06/11/23 0520  NA  --  135 134* 135 138 137 133*  K  --  4.3 3.9 3.8 4.3 4.0 4.4  CL  --  108 108 109 108 107 106  CO2  --  18* 17* 18* 19* 20* 20*  GLUCOSE  --  69* 61* 105* 78 70 127*  BUN  --  42* 28* 19 17 15 20   CREATININE  --  2.11* 1.06 0.85 0.87 0.83 0.82  CALCIUM  --  7.5* 7.6* 7.9* 8.5* 8.2*  8.6*  MG 2.0 1.9 2.1  --   --   --   --   PHOS  --   --  2.2*  --   --   --   --    GFR: Estimated Creatinine Clearance: 113.9 mL/min (by C-G formula based on SCr of 0.82 mg/dL). Liver Function Tests: Recent Labs  Lab 06/05/23 0523 06/06/23 0535 06/07/23 0533 06/08/23 0520 06/09/23 0458  AST 79* 71* 59* 62* 57*  ALT 43 40 41 47* 46*  ALKPHOS 38 37* 40 43 47  BILITOT 0.8 0.6 0.5 0.7 1.1  PROT 5.1* 4.8* 5.0* 5.4* 5.5*  ALBUMIN 2.4* 2.2* 2.2* 2.4* 2.3*   Sepsis Labs: Recent Labs  Lab 06/04/23 1346 06/04/23 1439 06/06/23 0535 06/09/23 0458  PROCALCITON  --  0.16 2.98 0.39  LATICACIDVEN 1.5  --   --   --     Recent Results (from the past 240 hours)  Resp panel by RT-PCR (RSV, Flu A&B, Covid) Anterior Nasal Swab     Status: Abnormal   Collection Time: 06/04/23 10:17 AM   Specimen: Anterior Nasal Swab  Result Value Ref Range Status   SARS Coronavirus 2 by RT PCR NEGATIVE NEGATIVE Final    Comment:  (NOTE) SARS-CoV-2 target nucleic acids are NOT DETECTED.  The SARS-CoV-2 RNA is generally detectable in upper respiratory specimens during the acute phase of infection. The lowest concentration of SARS-CoV-2 viral copies this assay can detect is 138 copies/mL. A negative result does not preclude SARS-Cov-2 infection and should not be used as the sole basis for treatment or other patient management decisions. A negative result may occur with  improper specimen collection/handling, submission of specimen other than nasopharyngeal swab, presence of viral mutation(s) within the areas targeted by this assay, and inadequate number of viral copies(<138 copies/mL). A negative result must be combined with clinical observations, patient history, and epidemiological information. The expected result is Negative.  Fact Sheet for Patients:  BloggerCourse.com  Fact Sheet for Healthcare Providers:  SeriousBroker.it  This test is no t yet approved or cleared by the [place] FDA and  has been authorized for detection and/or diagnosis of SARS-CoV-2 by FDA under an Emergency Use Authorization (EUA). This EUA will remain  in effect (meaning this test can be used) for the duration of the COVID-19 declaration under Section 564(b)(1) of the Act, 21 U.S.C.section 360bbb-3(b)(1), unless the authorization is terminated  or revoked sooner.       Influenza A by PCR POSITIVE (A) NEGATIVE Final   Influenza B by PCR NEGATIVE NEGATIVE Final    Comment: (NOTE) The Xpert Xpress SARS-CoV-2/FLU/RSV plus assay is intended as an aid in the diagnosis of influenza from Nasopharyngeal swab specimens and should not be used as a sole basis for treatment. Nasal washings and aspirates are unacceptable for Xpert Xpress SARS-CoV-2/FLU/RSV testing.  Fact Sheet for Patients: BloggerCourse.com  Fact Sheet for Healthcare  Providers: SeriousBroker.it  This test is not yet approved or cleared by the [place] FDA and has been authorized for detection and/or diagnosis of SARS-CoV-2 by FDA under an Emergency Use Authorization (EUA). This EUA will remain in effect (meaning this test can be used) for the duration of the COVID-19 declaration under Section 564(b)(1) of the Act, 21 U.S.C. section 360bbb-3(b)(1), unless the authorization is terminated or revoked.     Resp Syncytial Virus by PCR NEGATIVE NEGATIVE Final    Comment: (NOTE) Fact Sheet for Patients: BloggerCourse.com  Fact Sheet for Healthcare Providers: SeriousBroker.it  This test is not yet approved or cleared by the [place] and has been authorized for detection and/or diagnosis of SARS-CoV-2 by FDA under an Emergency Use Authorization (EUA). This EUA will remain in effect (meaning this test can be used) for the duration of the COVID-19 declaration under Section 564(b)(1) of the Act, 21 U.S.C. section 360bbb-3(b)(1), unless the authorization is terminated or revoked.  Performed at [facility], [address], [place], [place] [address]   Culture, blood (routine x 2)     Status: Abnormal   Collection Time: 06/04/23  2:44 PM   Specimen: BLOOD  Result Value Ref Range Status   Specimen Description   Final    BLOOD RIGHT ANTECUBITAL Performed at [facility], [address] [place], [place] [address]    Special Requests   Final    BOTTLES DRAWN AEROBIC AND ANAEROBIC Blood Culture adequate volume Performed at [facility], [address], [place], [place] [address]    Culture  Setup Time   Final    GRAM POSITIVE COCCI IN CLUSTERS AEROBIC BOTTLE ONLY CRITICAL RESULT CALLED TO, READ BACK BY AND VERIFIED WITH: PHARMD SYDNEY DAVIS [id number] AT 1556 BY EC    Culture (A)  Final    STAPHYLOCOCCUS  EPIDERMIDIS THE SIGNIFICANCE OF ISOLATING THIS ORGANISM FROM A SINGLE SET OF BLOOD CULTURES WHEN MULTIPLE SETS ARE DRAWN IS UNCERTAIN. PLEASE NOTIFY THE MICROBIOLOGY DEPARTMENT WITHIN ONE WEEK IF SPECIATION AND SENSITIVITIES ARE REQUIRED. Performed at [facility], [address] [place], [place] [address]    Report Status 06/06/2023 FINAL  Final  Blood Culture ID Panel (Reflexed)     Status: Abnormal   Collection Time: 06/04/23  2:44 PM  Result Value Ref Range Status   Enterococcus faecalis NOT DETECTED NOT DETECTED Final   Enterococcus Faecium NOT DETECTED NOT DETECTED Final   Listeria monocytogenes NOT DETECTED NOT DETECTED Final   Staphylococcus species DETECTED (A) NOT DETECTED Final    Comment: CRITICAL RESULT CALLED TO, READ BACK BY AND VERIFIED WITH: PHARMD SYDNEY DAVIS [id number] AT 1556Y EC    Staphylococcus aureus (BCID) NOT DETECTED NOT DETECTED Final   Staphylococcus epidermidis DETECTED (A) NOT DETECTED Final    Comment: Methicillin (oxacillin) resistant coagulase negative staphylococcus. Possible blood culture contaminant (unless isolated from more than one blood culture draw or clinical case suggests pathogenicity). No antibiotic treatment is indicated for blood  culture contaminants. CRITICAL RESULT CALLED TO, READ BACK BY AND VERIFIED WITH: PHARMD SYDNEY DAVIS [id number] AT 1556 BY EC    Staphylococcus lugdunensis NOT DETECTED NOT DETECTED Final   Streptococcus species NOT DETECTED NOT DETECTED Final   Streptococcus agalactiae NOT DETECTED NOT DETECTED Final   Streptococcus pneumoniae NOT DETECTED NOT DETECTED Final   Streptococcus pyogenes NOT DETECTED NOT DETECTED Final   A.calcoaceticus-baumannii NOT DETECTED NOT DETECTED Final   Bacteroides fragilis NOT DETECTED NOT DETECTED Final   Enterobacterales NOT DETECTED NOT DETECTED Final   Enterobacter cloacae complex NOT DETECTED NOT DETECTED Final   Escherichia coli NOT DETECTED NOT DETECTED Final   Klebsiella  aerogenes NOT DETECTED NOT DETECTED Final   Klebsiella oxytoca NOT DETECTED NOT DETECTED Final   Klebsiella pneumoniae NOT DETECTED NOT DETECTED Final   Proteus species NOT DETECTED NOT DETECTED Final   Salmonella species NOT DETECTED NOT DETECTED Final   Serratia marcescens NOT DETECTED NOT DETECTED Final   Haemophilus influenzae NOT DETECTED NOT DETECTED Final   Neisseria meningitidis NOT DETECTED NOT DETECTED  Final   Pseudomonas aeruginosa NOT DETECTED NOT DETECTED Final   Stenotrophomonas maltophilia NOT DETECTED NOT DETECTED Final   Candida albicans NOT DETECTED NOT DETECTED Final   Candida auris NOT DETECTED NOT DETECTED Final   Candida glabrata NOT DETECTED NOT DETECTED Final   Candida krusei NOT DETECTED NOT DETECTED Final   Candida parapsilosis NOT DETECTED NOT DETECTED Final   Candida tropicalis NOT DETECTED NOT DETECTED Final   Cryptococcus neoformans/gattii NOT DETECTED NOT DETECTED Final   Methicillin resistance mecA/C DETECTED (A) NOT DETECTED Final    Comment: CRITICAL RESULT CALLED TO, READ BACK BY AND VERIFIED WITH: PHARMD SYDNEY DAVIS [id number] AT 1556 BY EC Performed at [facility], [address], [place], [place] [address]   Culture, blood (routine x 2)     Status: None   Collection Time: 06/04/23  2:47 PM   Specimen: BLOOD  Result Value Ref Range Status   Specimen Description   Final    BLOOD LEFT ANTECUBITAL Performed at [facility], [address], [place], [place] [address]    Special Requests   Final    BOTTLES DRAWN AEROBIC AND ANAEROBIC Blood Culture results may not be optimal due to an inadequate volume of blood received in culture bottles Performed at [facility], [address] [place], [place] [address]    Culture   Final    NO GROWTH 5 DAYS Performed at [facility], [address], [place], [place] [address]    Report Status 06/09/2023 FINAL  Final  MRSA Next Gen by PCR, Nasal     Status:  None   Collection Time: 06/09/23  1:01 PM   Specimen: Nasal Mucosa; Nasal Swab  Result Value Ref Range Status   MRSA by PCR Next Gen NOT DETECTED NOT DETECTED Final    Comment: (NOTE) The GeneXpert MRSA Assay (FDA approved for NASAL specimens only), is one component of a comprehensive MRSA colonization surveillance program. It is not intended to diagnose MRSA infection nor to guide or monitor treatment for MRSA infections. Test performance is not FDA approved in patients less than 48 years old. Performed at [facility], [address] [place], [place] [address]          Radiology Studies: No results found.  Scheduled Meds:  allopurinol  300 mg Oral Daily   budesonide (PULMICORT) nebulizer solution  2 mg Nebulization Q12H   enoxaparin (LOVENOX) injection  40 mg Subcutaneous QHS   feeding supplement  1 Container Oral TID BM   FLUoxetine  20 mg Oral BID   gabapentin  600 mg Oral BID   guaiFENesin  600 mg Oral BID   pantoprazole  40 mg Oral Daily   predniSONE  40 mg Oral Q breakfast   Continuous Infusions:  ceFEPime (MAXIPIME) IV 2 g (06/11/23 0355)     LOS: 7 days   Time spent:  Azucena Fallen, DO [facility]  If 7PM-7AM, please contact night-coverage [email or web address]  06/11/2023, 7:42 AM

## 2023-06-11 NOTE — Progress Notes (Signed)
 Pharmacy Antibiotic Note  David Mcgrath is a 73 y.o. male admitted on 06/04/2023 with acute hypoxic respiratory failure, influenza A (outside of treatment window), and aspiration pneumonia. Patient initially on treatment with Unasyn and azithromycin but continued to clinically decline. Pharmacy has now been consulted for cefepime dosing.  Plan: Continue cefepime 2 g IV every 8 hours (MD says for at least 5 days) Monitor clinical progress & renal function F/U abx deescalation / LOT   Height: 6\' 4"  (193 cm) Weight: 113.5 kg (250 lb 4.8 oz) IBW/kg (Calculated) : 86.8  Temp (24hrs), Avg:97.8 F (36.6 C), Min:97.7 F (36.5 C), Max:97.9 F (36.6 C)  Recent Labs  Lab 06/04/23 1346 06/05/23 0523 06/06/23 0535 06/07/23 0533 06/08/23 0520 06/09/23 0458 06/10/23 1246 06/11/23 0520  WBC  --    < > 6.9 7.7 9.8 11.8* 14.7*  --   CREATININE  --    < > 1.06 0.85 0.87 0.83  --  0.82  LATICACIDVEN 1.5  --   --   --   --   --   --   --    < > = values in this interval not displayed.    Estimated Creatinine Clearance: 113.9 mL/min (by C-G formula based on SCr of 0.82 mg/dL).    Allergies  Allergen Reactions   Shrimp [Shellfish Allergy] Anaphylaxis and Swelling    Swelling throat     Antimicrobials:  Azithromycin 3/2 - 3/6 Unasyn 3/2 - 3/4; 3/6 x 2 Augmentin 3/5 - 3/5 Linezolid 3/6>>3/8 Cefepime 3/6>>  Microbiology results: 3/2 BCx: 1/4 Bcx bottles growing staph epi with mecA resistance (MRSE). Likely a contaminant.  3/2 positive for influenza A+ Strep Pneumo UA: neg Legionella UA: in process MRSA PCR neg   Thank you for allowing pharmacy to be a part of this patient's care.  Cherylin Mylar, PharmD Clinical Pharmacist  3/9/202512:02 PM

## 2023-06-11 NOTE — Progress Notes (Signed)
 PT Cancellation Note  Patient Details Name: Moroni Nester MRN: [id number] DOB: 1950/06/12   Cancelled Treatment:    Reason Eval/Treat Not Completed: Patient declined, no reason specified Pt declined participating at this time.  Pt encouraged to mobilize since he reports he plans to d/c home with spouse.  Pt stated he planned on sitting EOB and doing exercises in the afternoon with his wife. Pt again declined to participate at this time stating, "I know it sounds like I'm making excuses but I'm not."   Kati L Payson 06/11/2023, 11:57 AM Paulino Door, DPT Physical Therapist Acute Rehabilitation Services Office: [phone]

## 2023-06-12 DIAGNOSIS — N179 Acute kidney failure, unspecified: Secondary | ICD-10-CM | POA: Diagnosis not present

## 2023-06-12 DIAGNOSIS — A419 Sepsis, unspecified organism: Secondary | ICD-10-CM | POA: Diagnosis not present

## 2023-06-12 DIAGNOSIS — J9601 Acute respiratory failure with hypoxia: Secondary | ICD-10-CM | POA: Diagnosis not present

## 2023-06-12 DIAGNOSIS — J111 Influenza due to unidentified influenza virus with other respiratory manifestations: Secondary | ICD-10-CM | POA: Diagnosis not present

## 2023-06-12 LAB — PROTEIN ELECTROPHORESIS, SERUM
A/G Ratio: 0.9 (ref 0.7–1.7)
Albumin ELP: 2.3 g/dL — ABNORMAL LOW (ref 2.9–4.4)
Alpha-1-Globulin: 0.4 g/dL (ref 0.0–0.4)
Alpha-2-Globulin: 1 g/dL (ref 0.4–1.0)
Beta Globulin: 0.6 g/dL — ABNORMAL LOW (ref 0.7–1.3)
Gamma Globulin: 0.5 g/dL (ref 0.4–1.8)
Globulin, Total: 2.5 g/dL (ref 2.2–3.9)
Total Protein ELP: 4.8 g/dL — ABNORMAL LOW (ref 6.0–8.5)

## 2023-06-12 NOTE — Progress Notes (Addendum)
 PROGRESS NOTE    David Mcgrath  ZOX:[id number] DOB: 01/28/1951 DOA: 06/04/2023 PCP: Charlane Ferretti, DO   Brief Narrative:  David Mcgrath is a 73 y.o. male with past medical history significant for basal cell carcinoma, HTN, HLD, neuropathy, gout, anxiety/depression, GERD who presented to [facility], ED on 06/04/2023 with an acute exacerbation of what appears to be a somewhat chronic and progressive dyspnea.  Wife indicates upwards of 9 months of respiratory symptoms most notably worsening over the past week with associated fatigue, poor oral intake, right-sided abdominal pain, nausea/vomiting/diarrhea. He was seen by his PCP and most recently Pulmonology Marchelle Gearing) in Jan 2025 for his cough. Symptoms were attributed to bronchitis and patient has been given steroids and antibiotics, he reports have these have not helped. Denies chest pain, palpitations, fevers, chills, or known sick contacts.   Assessment & Plan: Principal Problem:   Pneumonia Active Problems:   Ileus (HCC)   Acute hypoxic respiratory failure (HCC)   Flu   Essential hypertension   Fatty liver   AKI (acute kidney injury) (HCC)   Hyperlipidemia   Diarrhea  Acute Hypoxic Respiratory Failure, resolving Influenza A Aspiration Pneumonia Severe sepsis secondary to above Rule out underlying COPD Sleep apnea, obstructive -Noted febrile tachypnea, leukocytosis with obvious pneumonia on imaging -Acute hypoxia with AKI meeting criteria for severe sepsis -Continue supportive care, previously treated with azithromycin in the outpatient setting with no improvement as well as steroid taper recently for concern over underlying COPD -DC linezolid (MRSA negative), continue cefepime given previous failure of azithromycin -Supportive care, nebs, incentive spirometry, flutter as tolerated -No indication for Tamiflu given patient is outside the window for treatment -Remote history of smoking, unlikely underlying COPD however  given chronicity of patient's respiratory symptoms as above for "months" we will initiate inhaled and systemic steroids -follow-up with Dr. Marchelle Gearing as scheduled outpatient -Continue cpap at home settings (no oxygen required at baseline) -Encourage ambulation, patient off oxygen at rest, hopefully will be able to wean oxygen even with exertion   Acute ileus, improving Diarrhea, improving Repeat CT abdomen suggestive of ileus with no clear transition point for obstruction -contrast noted in the colon -continues to have diarrhea/flatus -Nausea well controlled at this time on Zofran   Acute Kidney Injury: resolved Without history of CKD  Peak creatinine and intake 1.95, downtrending - now within normal limits Hold home ARB -blood pressure currently well-controlled despite being off medication Urine output low over the past 24 hours, encourage p.o. fluids, consider small IV bolus if necessary   Hepatic Steatosis Elevated LFTs -- AST 96>79>59>62 -- ALT 49>43>41>47 -- Tbili 0.6>0.8>0.5 -- Hold home statin -Stabilizing, follow-up outpatient for repeat labs as appropriate   Hyperlipidemia --Holding statin given above  Hypertension -Hold ARB given above, currently well-controlled on as needed hydralazine   Anxiety/depression: Fluoxetine 20 mg twice daily   Neuropathy -- Gabapentin 600 mg p.o. 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  Recent colonoscopy with no concerning findings per patient.  CT chest, abdomen/pelvis with no findings suggestive of primary malignancy.  Alkaline phosphatase within normal limits.  Unclear etiology of osseous lesions.  Would likely benefit from outpatient follow-up with PCP/oncology and would likely need outpatient biopsy, PET scan for further workup. -- SPEP/UPEP ordered and pending   Weakness/debility/deconditioning: - PT/OT currently recommending SNF placement -  patient prefers DC home if at all possible with home health - TOC consulted  DVT prophylaxis: enoxaparin (LOVENOX) injection 40 mg Start: 06/04/23 2200 Code Status:   Code Status: Full Code Family Communication: Wife at bedside  Status is: Inpatient  Dispo: The patient is from: Home              Anticipated d/c is to: To be determined, likely SNF              Anticipated d/c date is: 48 to 72 hours              Patient currently not medically stable for discharge  Consultants:  None  Procedures:  None  Antimicrobials:  cefepime x 5 days Linezolid discontinued, MRSA negative  Subjective: No acute issues or events overnight, continues to have flatus but notes no bowel movement in 48 hours, no urge to defecate, unlikely constipated or obstructed given prior ongoing diarrheal illness.  Objective: Vitals:   06/11/23 1432 06/11/23 1831 06/11/23 2108 06/12/23 0441  BP: (!) 141/69  (!) 145/76 (!) 140/72  Pulse: 78  67 65  Resp:   14 15  Temp: 97.9 F (36.6 C)  98.1 F (36.7 C) 97.9 F (36.6 C)  TempSrc:      SpO2: 96% 94% 100% 96%  Weight:      Height:        Intake/Output Summary (Last 24 hours) at 06/12/2023 0721 Last data filed at 06/12/2023 0536 Gross per 24 hour  Intake 850 ml  Output 1200 ml  Net -350 ml   Filed Weights   06/07/23 1820  Weight: 113.5 kg    Examination:  General:  Pleasantly resting in bed, No acute distress. HEENT:  Normocephalic atraumatic.  Sclerae nonicteric, noninjected.  Extraocular movements intact bilaterally. Neck:  Without mass or deformity.  Trachea is midline. Lungs: Diffuse rhonchi bilaterally. Heart:  Regular rate and rhythm.  Without murmurs, rubs, or gallops. Abdomen:  Soft, nontender, nondistended.  Without guarding or rebound. Extremities: Without cyanosis, clubbing, edema, or obvious deformity. Skin:  Warm and dry, no erythema.  Data Reviewed: I have personally reviewed following labs and imaging studies  CBC: Recent  Labs  Lab 06/06/23 0535 06/07/23 0533 06/08/23 0520 06/09/23 0458 06/10/23 1246  WBC 6.9 7.7 9.8 11.8* 14.7*  HGB 10.3* 10.5* 11.0* 10.9* 11.0*  HCT 31.9* 32.7* 33.7* 32.8* 34.0*  MCV 100.0 99.7 96.8 96.2 97.1  PLT 177 194 238 244 291   Basic Metabolic Panel: Recent Labs  Lab 06/06/23 0535 06/07/23 0533 06/08/23 0520 06/09/23 0458 06/11/23 0520  NA 134* 135 138 137 133*  K 3.9 3.8 4.3 4.0 4.4  CL 108 109 108 107 106  CO2 17* 18* 19* 20* 20*  GLUCOSE 61* 105* 78 70 127*  BUN 28* 19 17 15 20   CREATININE 1.06 0.85 0.87 0.83 0.82  CALCIUM 7.6* 7.9* 8.5* 8.2* 8.6*  MG 2.1  --   --   --   --   PHOS 2.2*  --   --   --   --    GFR: Estimated Creatinine Clearance: 113.9 mL/min (by C-G formula based on SCr of 0.82 mg/dL). Liver Function Tests: Recent Labs  Lab 06/06/23 0535 06/07/23 0533 06/08/23 0520 06/09/23 0458  AST 71* 59* 62* 57*  ALT 40 41 47* 46*  ALKPHOS 37* 40 43 47  BILITOT 0.6 0.5 0.7 1.1  PROT 4.8* 5.0* 5.4* 5.5*  ALBUMIN 2.2* 2.2* 2.4* 2.3*   Sepsis Labs: Recent Labs  Lab 06/06/23 0535 06/09/23 0458  PROCALCITON 2.98 0.39  Recent Results (from the past 240 hours)  Resp panel by RT-PCR (RSV, Flu A&B, Covid) Anterior Nasal Swab     Status: Abnormal   Collection Time: 06/04/23 10:17 AM   Specimen: Anterior Nasal Swab  Result Value Ref Range Status   SARS Coronavirus 2 by RT PCR NEGATIVE NEGATIVE Final    Comment: (NOTE) SARS-CoV-2 target nucleic acids are NOT DETECTED.  The SARS-CoV-2 RNA is generally detectable in upper respiratory specimens during the acute phase of infection. The lowest concentration of SARS-CoV-2 viral copies this assay can detect is 138 copies/mL. A negative result does not preclude SARS-Cov-2 infection and should not be used as the sole basis for treatment or other patient management decisions. A negative result may occur with  improper specimen collection/handling, submission of specimen other than nasopharyngeal swab,  presence of viral mutation(s) within the areas targeted by this assay, and inadequate number of viral copies(<138 copies/mL). A negative result must be combined with clinical observations, patient history, and epidemiological information. The expected result is Negative.  Fact Sheet for Patients:  BloggerCourse.com  Fact Sheet for Healthcare Providers:  SeriousBroker.it  This test is no t yet approved or cleared by the [place] FDA and  has been authorized for detection and/or diagnosis of SARS-CoV-2 by FDA under an Emergency Use Authorization (EUA). This EUA will remain  in effect (meaning this test can be used) for the duration of the COVID-19 declaration under Section 564(b)(1) of the Act, 21 U.S.C.section 360bbb-3(b)(1), unless the authorization is terminated  or revoked sooner.       Influenza A by PCR POSITIVE (A) NEGATIVE Final   Influenza B by PCR NEGATIVE NEGATIVE Final    Comment: (NOTE) The Xpert Xpress SARS-CoV-2/FLU/RSV plus assay is intended as an aid in the diagnosis of influenza from Nasopharyngeal swab specimens and should not be used as a sole basis for treatment. Nasal washings and aspirates are unacceptable for Xpert Xpress SARS-CoV-2/FLU/RSV testing.  Fact Sheet for Patients: BloggerCourse.com  Fact Sheet for Healthcare Providers: SeriousBroker.it  This test is not yet approved or cleared by the [place] FDA and has been authorized for detection and/or diagnosis of SARS-CoV-2 by FDA under an Emergency Use Authorization (EUA). This EUA will remain in effect (meaning this test can be used) for the duration of the COVID-19 declaration under Section 564(b)(1) of the Act, 21 U.S.C. section 360bbb-3(b)(1), unless the authorization is terminated or revoked.     Resp Syncytial Virus by PCR NEGATIVE NEGATIVE Final    Comment: (NOTE) Fact Sheet for  Patients: BloggerCourse.com  Fact Sheet for Healthcare Providers: SeriousBroker.it  This test is not yet approved or cleared by the [place] FDA and has been authorized for detection and/or diagnosis of SARS-CoV-2 by FDA under an Emergency Use Authorization (EUA). This EUA will remain in effect (meaning this test can be used) for the duration of the COVID-19 declaration under Section 564(b)(1) of the Act, 21 U.S.C. section 360bbb-3(b)(1), unless the authorization is terminated or revoked.  Performed at [facility], [address], [place], [place] [address]   Culture, blood (routine x 2)     Status: Abnormal   Collection Time: 06/04/23  2:44 PM   Specimen: BLOOD  Result Value Ref Range Status   Specimen Description   Final    BLOOD RIGHT ANTECUBITAL Performed at [facility], [address] [place], [place] [address]    Special Requests   Final    BOTTLES DRAWN AEROBIC AND ANAEROBIC  Blood Culture adequate volume Performed at [facility], [address] [place], [place] [address]    Culture  Setup Time   Final    GRAM POSITIVE COCCI IN CLUSTERS AEROBIC BOTTLE ONLY CRITICAL RESULT CALLED TO, READ BACK BY AND VERIFIED WITH: PHARMD SYDNEY DAVIS [id number] AT 1556 BY EC    Culture (A)  Final    STAPHYLOCOCCUS EPIDERMIDIS THE SIGNIFICANCE OF ISOLATING THIS ORGANISM FROM A SINGLE SET OF BLOOD CULTURES WHEN MULTIPLE SETS ARE DRAWN IS UNCERTAIN. PLEASE NOTIFY THE MICROBIOLOGY DEPARTMENT WITHIN ONE WEEK IF SPECIATION AND SENSITIVITIES ARE REQUIRED. Performed at [facility], [address], [place], [place] [address]    Report Status 06/06/2023 FINAL  Final  Blood Culture ID Panel (Reflexed)     Status: Abnormal   Collection Time: 06/04/23  2:44 PM  Result Value Ref Range Status   Enterococcus faecalis NOT DETECTED NOT DETECTED Final   Enterococcus Faecium NOT  DETECTED NOT DETECTED Final   Listeria monocytogenes NOT DETECTED NOT DETECTED Final   Staphylococcus species DETECTED (A) NOT DETECTED Final    Comment: CRITICAL RESULT CALLED TO, READ BACK BY AND VERIFIED WITH: PHARMD SYDNEY DAVIS [id number] AT 1556Y EC    Staphylococcus aureus (BCID) NOT DETECTED NOT DETECTED Final   Staphylococcus epidermidis DETECTED (A) NOT DETECTED Final    Comment: Methicillin (oxacillin) resistant coagulase negative staphylococcus. Possible blood culture contaminant (unless isolated from more than one blood culture draw or clinical case suggests pathogenicity). No antibiotic treatment is indicated for blood  culture contaminants. CRITICAL RESULT CALLED TO, READ BACK BY AND VERIFIED WITH: PHARMD SYDNEY DAVIS [id number] AT 1556 BY EC    Staphylococcus lugdunensis NOT DETECTED NOT DETECTED Final   Streptococcus species NOT DETECTED NOT DETECTED Final   Streptococcus agalactiae NOT DETECTED NOT DETECTED Final   Streptococcus pneumoniae NOT DETECTED NOT DETECTED Final   Streptococcus pyogenes NOT DETECTED NOT DETECTED Final   A.calcoaceticus-baumannii NOT DETECTED NOT DETECTED Final   Bacteroides fragilis NOT DETECTED NOT DETECTED Final   Enterobacterales NOT DETECTED NOT DETECTED Final   Enterobacter cloacae complex NOT DETECTED NOT DETECTED Final   Escherichia coli NOT DETECTED NOT DETECTED Final   Klebsiella aerogenes NOT DETECTED NOT DETECTED Final   Klebsiella oxytoca NOT DETECTED NOT DETECTED Final   Klebsiella pneumoniae NOT DETECTED NOT DETECTED Final   Proteus species NOT DETECTED NOT DETECTED Final   Salmonella species NOT DETECTED NOT DETECTED Final   Serratia marcescens NOT DETECTED NOT DETECTED Final   Haemophilus influenzae NOT DETECTED NOT DETECTED Final   Neisseria meningitidis NOT DETECTED NOT DETECTED Final   Pseudomonas aeruginosa NOT DETECTED NOT DETECTED Final   Stenotrophomonas maltophilia NOT DETECTED NOT DETECTED Final   Candida albicans NOT  DETECTED NOT DETECTED Final   Candida auris NOT DETECTED NOT DETECTED Final   Candida glabrata NOT DETECTED NOT DETECTED Final   Candida krusei NOT DETECTED NOT DETECTED Final   Candida parapsilosis NOT DETECTED NOT DETECTED Final   Candida tropicalis NOT DETECTED NOT DETECTED Final   Cryptococcus neoformans/gattii NOT DETECTED NOT DETECTED Final   Methicillin resistance mecA/C DETECTED (A) NOT DETECTED Final    Comment: CRITICAL RESULT CALLED TO, READ BACK BY AND VERIFIED WITH: PHARMD SYDNEY DAVIS [id number] AT 1556 BY EC Performed at [facility], [address] [place], [place] [address]   Culture, blood (routine x 2)     Status: None   Collection Time: 06/04/23  2:47 PM   Specimen: BLOOD  Result  Value Ref Range Status   Specimen Description   Final    BLOOD LEFT ANTECUBITAL Performed at [facility], [address] [place], [place] [address]    Special Requests   Final    BOTTLES DRAWN AEROBIC AND ANAEROBIC Blood Culture results may not be optimal due to an inadequate volume of blood received in culture bottles Performed at [facility], [address], [place], [place] [address]    Culture   Final    NO GROWTH 5 DAYS Performed at [facility], [address] [place], [place] [address]    Report Status 06/09/2023 FINAL  Final  MRSA Next Gen by PCR, Nasal     Status: None   Collection Time: 06/09/23  1:01 PM   Specimen: Nasal Mucosa; Nasal Swab  Result Value Ref Range Status   MRSA by PCR Next Gen NOT DETECTED NOT DETECTED Final    Comment: (NOTE) The GeneXpert MRSA Assay (FDA approved for NASAL specimens only), is one component of a comprehensive MRSA colonization surveillance program. It is not intended to diagnose MRSA infection nor to guide or monitor treatment for MRSA infections. Test performance is not FDA approved in patients less than 11 years old. Performed at [facility], [address], [place], [place] [address]          Radiology Studies: No results found.  Scheduled Meds:  allopurinol  300 mg Oral Daily   budesonide (PULMICORT) nebulizer solution  0.5 mg Nebulization Q12H   enoxaparin (LOVENOX) injection  40 mg Subcutaneous QHS   feeding supplement  1 Container Oral TID BM   FLUoxetine  20 mg Oral BID   gabapentin  600 mg Oral BID   guaiFENesin  600 mg Oral BID   pantoprazole  40 mg Oral Daily   predniSONE  40 mg Oral Q breakfast   Continuous Infusions:  ceFEPime (MAXIPIME) IV 2 g (06/12/23 0118)     LOS: 8 days   Time spent:  Azucena Fallen, DO [facility]  If 7PM-7AM, please contact night-coverage [email or web address]  06/12/2023, 7:21 AM

## 2023-06-12 NOTE — Progress Notes (Signed)
 Occupational Therapy Treatment Patient Details Name: David Mcgrath MRN: [id number] DOB: [birth year] Today's Date: 06/12/2023   History of present illness 73 yo male admitted with asp pna, aki, flu+, weakness. Hx of basal cell Ca, neuropathy, gout, anxiety/depression   OT comments  Pt willing to work with therapies, apologetic with excessive productive cough with sitting EOB. Heavy mod assist needed to stand from elevated bed and transfer to chair. Min assist to don front opening gown, total assist for socks. O2 noted to drop to 75% after transfer to chair, needed seated rest break and 5L O2 to rebound to low 90s. Continue to recommend inpatient follow up therapy, <3 hours/day.       If plan is discharge home, recommend the following:  Two people to help with walking and/or transfers;A lot of help with bathing/dressing/bathroom;Two people to help with bathing/dressing/bathroom;Assist for transportation;Help with stairs or ramp for entrance   Equipment Recommendations  None recommended by OT    Recommendations for Other Services      Precautions / Restrictions Precautions Precautions: Fall Precaution/Restrictions Comments: monitor O2;coughing spells Restrictions Weight Bearing Restrictions Per Provider Order: No       Mobility Bed Mobility Overal bed mobility: Needs Assistance Bed Mobility: Supine to Sit     Supine to sit: Contact guard, HOB elevated, Used rails          Transfers Overall transfer level: Needs assistance Equipment used: Rolling walker (2 wheels) Transfers: Sit to/from Stand, Bed to chair/wheelchair/BSC Sit to Stand: From elevated surface, Mod assist     Step pivot transfers: Mod assist, +2 safety/equipment     General transfer comment: pt requiring increased time after sitting up due to coughing, eventually able to stand with heavy mod assist and step to chair with mod assist +2 safety for lines, pt with tendency to sit abruptly     Balance  Overall balance assessment: Needs assistance   Sitting balance-Leahy Scale: Fair       Standing balance-Leahy Scale: Poor                             ADL either performed or assessed with clinical judgement   ADL Overall ADL's : Needs assistance/impaired Eating/Feeding: Minimal assistance;Bed level;Sitting Eating/Feeding Details (indicate cue type and reason): hot tea             Upper Body Dressing : Moderate assistance;Sitting Upper Body Dressing Details (indicate cue type and reason): front opening gown Lower Body Dressing: Total assistance;Bed level Lower Body Dressing Details (indicate cue type and reason): sock mgmt Toilet Transfer: Moderate assistance;Stand-pivot   Toileting- Clothing Manipulation and Hygiene: Total assistance;Sit to/from stand              Extremity/Trunk Assessment              Vision       Perception     Praxis     Communication Communication Communication: No apparent difficulties   Cognition Arousal: Alert Behavior During Therapy: WFL for tasks assessed/performed Cognition: No apparent impairments                                        Cueing   Cueing Techniques: Verbal cues  Exercises      Shoulder Instructions       General Comments      Pertinent Vitals/ Pain  Pain Assessment Pain Assessment: No/denies pain  Home Living                                          Prior Functioning/Environment              Frequency  Min 1X/week        Progress Toward Goals  OT Goals(current goals can now be found in the care plan section)  Progress towards OT goals: Not progressing toward goals - comment  Acute Rehab OT Goals OT Goal Formulation: With patient/family Time For Goal Achievement: 06/22/23 Potential to Achieve Goals: Good  Plan      Co-evaluation    PT/OT/SLP Co-Evaluation/Treatment: Yes            AM-PAC OT "6 Clicks" Daily Activity      Outcome Measure   Help from another person eating meals?: A Little Help from another person taking care of personal grooming?: A Little Help from another person toileting, which includes using toliet, bedpan, or urinal?: Total Help from another person bathing (including washing, rinsing, drying)?: A Lot Help from another person to put on and taking off regular upper body clothing?: A Little Help from another person to put on and taking off regular lower body clothing?: Total 6 Click Score: 13    End of Session Equipment Utilized During Treatment: Rolling walker (2 wheels);Gait belt;Oxygen  OT Visit Diagnosis: Other abnormalities of gait and mobility (R26.89);Muscle weakness (generalized) (M62.81);Unsteadiness on feet (R26.81);Other (comment) (decreased activity tolerance)   Activity Tolerance Treatment limited secondary to medical complications (Comment) (coughing and O2 sat dropping)   Patient Left in chair;with call bell/phone within reach;with chair alarm set;with nursing/sitter in room   Nurse Communication Other (comment) (aware of O2 sat dropping)        Time: 1610-9604 OT Time Calculation (min): 50 min  Charges: OT General Charges $OT Visit: 1 Visit OT Treatments $Self Care/Home Management : 8-22 mins  Berna Spare, OTR/L Acute Rehabilitation Services Office: [phone]   Evern Bio 06/12/2023, 1:51 PM

## 2023-06-12 NOTE — Progress Notes (Signed)
 Physical Therapy Treatment Patient Details Name: David Mcgrath MRN: [id number] DOB: 01-20-1951 Today's Date: 06/12/2023   History of Present Illness 73 yo male admitted with asp pna, aki, flu+, weakness. Hx of basal cell Ca, neuropathy, gout, anxiety/depression    PT Comments  Pt agreeable to working with therapy. Session limited by severe coughing spell, drop in O2 sats, weakness, fatigue. Pt is at risk for falls when mobilizing. Mobility remains limited to transfers-requires Mod A. O2: 95% on RA at rest, 75% on RA with minimal activity. Required NCO2 5L for sats to recover to 90%. Wife not present during session on today. Patient will benefit from continued inpatient follow up therapy, <3 hours/day     If plan is discharge home, recommend the following: A lot of help with walking and/or transfers;A lot of help with bathing/dressing/bathroom;Assistance with cooking/housework;Assist for transportation;Help with Mcgrath or ramp for entrance   Can travel by private vehicle        Equipment Recommendations  Rolling walker (2 wheels);Wheelchair (measurements PT);Wheelchair cushion (measurements PT) (if they do not already have them-may need to check with wife)    Recommendations for Other Services       Precautions / Restrictions Precautions Precautions: Fall Precaution/Restrictions Comments: monitor O2;coughing spells Restrictions Weight Bearing Restrictions Per Provider Order: No     Mobility  Bed Mobility Overal bed mobility: Needs Assistance Bed Mobility: Supine to Sit     Supine to sit: Contact guard, HOB elevated, Used rails     General bed mobility comments: Increased time. severe coughing spell once sitting eob.    Transfers Overall transfer level: Needs assistance Equipment used: Rolling walker (2 wheels) Transfers: Sit to/from Stand, Bed to chair/wheelchair/BSC Sit to Stand: From elevated surface, Mod assist Stand pivot transfers: Mod assist          General transfer comment: Assist to power up, steady, control descent. Cues for safety, technique, hand placement. Increased time. Fall risk. Stand pivot, bed to recliner, using RW. O2 dropped to 75% on RA. Cues for pursed lip breathing. RN in room bumped O2 up to 5L to aid recovery-at least 5 minutes for pt sats to recover to 90%.    Ambulation/Gait               General Gait Details: Nt-pt unable.   Mcgrath             Wheelchair Mobility     Tilt Bed    Modified Rankin (Stroke Patients Only)       Balance Overall balance assessment: Needs assistance         Standing balance support: Reliant on assistive device for balance, During functional activity, Bilateral upper extremity supported Standing balance-Leahy Scale: Poor                              Communication Communication Communication: No apparent difficulties  Cognition Arousal: Alert Behavior During Therapy: WFL for tasks assessed/performed   PT - Cognitive impairments: No apparent impairments                         Following commands: Intact      Cueing Cueing Techniques: Verbal cues  Exercises      General Comments        Pertinent Vitals/Pain Pain Assessment Pain Assessment: No/denies pain    Home Living  Prior Function            PT Goals (current goals can now be found in the care plan section) Progress towards PT goals: Progressing toward goals    Frequency    Min 2X/week      PT Plan      Co-evaluation              AM-PAC PT "6 Clicks" Mobility   Outcome Measure  Help needed turning from your back to your side while in a flat bed without using bedrails?: A Little Help needed moving from lying on your back to sitting on the side of a flat bed without using bedrails?: A Little Help needed moving to and from a bed to a chair (including a wheelchair)?: A Lot Help needed standing up from a chair  using your arms (e.g., wheelchair or bedside chair)?: A Lot Help needed to walk in hospital room?: Total Help needed climbing 3-5 steps with a railing? : Total 6 Click Score: 12    End of Session Equipment Utilized During Treatment: Gait belt;Oxygen Activity Tolerance: Patient limited by fatigue Patient left: in chair;with call bell/phone within reach;with chair alarm set   PT Visit Diagnosis: Muscle weakness (generalized) (M62.81);Difficulty in walking, not elsewhere classified (R26.2)     Time: 1610-9604 PT Time Calculation (min) (ACUTE ONLY): 50 min  Charges:    $Therapeutic Activity: 23-37 mins PT General Charges $$ ACUTE PT VISIT: 1 Visit                         Faye Ramsay, PT Acute Rehabilitation  Office: [phone]

## 2023-06-12 NOTE — TOC Progression Note (Signed)
 Transition of Care [facility]) - Progression Note    Patient Details  Name: David Mcgrath MRN: [id number] Date of Birth: [birth year]  Transition of [facility]) CM/SW Contact  Nandan Willems, [name], RN Phone Number: 06/12/2023, 4:47 PM  Clinical Narrative: spouse chose [employer] rep Nikki-pending [phone].to access navi health type in name:Rease III Dev.      Expected Discharge Plan: Skilled Nursing Facility Barriers to Discharge: Insurance Authorization  Expected Discharge Plan and Services                                               Social Determinants of Health (SDOH) Interventions SDOH Screenings   Food Insecurity: No Food Insecurity (06/04/2023)  Housing: Low Risk  (06/04/2023)  Transportation Needs: No Transportation Needs (06/04/2023)  Utilities: Not At Risk (06/04/2023)  Social Connections: Socially Integrated (06/04/2023)  Tobacco Use: Low Risk  (06/04/2023)    Readmission Risk Interventions     No data to display

## 2023-06-13 DIAGNOSIS — N179 Acute kidney failure, unspecified: Secondary | ICD-10-CM | POA: Diagnosis not present

## 2023-06-13 DIAGNOSIS — J9601 Acute respiratory failure with hypoxia: Secondary | ICD-10-CM | POA: Diagnosis not present

## 2023-06-13 DIAGNOSIS — A419 Sepsis, unspecified organism: Secondary | ICD-10-CM | POA: Diagnosis not present

## 2023-06-13 DIAGNOSIS — J111 Influenza due to unidentified influenza virus with other respiratory manifestations: Secondary | ICD-10-CM | POA: Diagnosis not present

## 2023-06-13 LAB — CBC
HCT: 31.9 % — ABNORMAL LOW (ref 39.0–52.0)
Hemoglobin: 10.7 g/dL — ABNORMAL LOW (ref 13.0–17.0)
MCH: 32 pg (ref 26.0–34.0)
MCHC: 33.5 g/dL (ref 30.0–36.0)
MCV: 95.5 fL (ref 80.0–100.0)
Platelets: 350 10*3/uL (ref 150–400)
RBC: 3.34 MIL/uL — ABNORMAL LOW (ref 4.22–5.81)
RDW: 13.5 % (ref 11.5–15.5)
WBC: 15.6 10*3/uL — ABNORMAL HIGH (ref 4.0–10.5)
nRBC: 0 % (ref 0.0–0.2)

## 2023-06-13 NOTE — Plan of Care (Signed)
  Problem: Health Behavior/Discharge Planning: Goal: Ability to manage health-related needs will improve Outcome: Progressing   Problem: Clinical Measurements: Goal: Ability to maintain clinical measurements within normal limits will improve Outcome: Progressing   Problem: Activity: Goal: Risk for activity intolerance will decrease Outcome: Progressing   Problem: Elimination: Goal: Will not experience complications related to bowel motility Outcome: Progressing   Problem: Pain Managment: Goal: General experience of comfort will improve and/or be controlled Outcome: Progressing   Problem: Activity: Goal: Ability to tolerate increased activity will improve Outcome: Progressing

## 2023-06-13 NOTE — TOC Progression Note (Addendum)
 Transition of Care [facility]) - Progression Note    Patient Details  Name: David Mcgrath MRN: [id number] Date of Birth: [birth year]  Transition of Care [facility]) CM/SW Contact  Kolette Vey, [name], RN Phone Number: 06/13/2023, 11:34 AM  Clinical Narrative: Insurance is questioning HR, & 02 requirements if stable for d/c to ST SNF per yesterdays note.PT please place a note for today.Is patient stable for d/c today. [employer] is awaiting auth -12:40p-PT to see patient again insurance w/concerns about 02 sats dropping, & ability to do PT-await updated note.PT aware.       Expected Discharge Plan: Skilled Nursing Facility Barriers to Discharge: Insurance Authorization  Expected Discharge Plan and Services                                               Social Determinants of Health (SDOH) Interventions SDOH Screenings   Food Insecurity: No Food Insecurity (06/04/2023)  Housing: Low Risk  (06/04/2023)  Transportation Needs: No Transportation Needs (06/04/2023)  Utilities: Not At Risk (06/04/2023)  Social Connections: Socially Integrated (06/04/2023)  Tobacco Use: Low Risk  (06/04/2023)    Readmission Risk Interventions     No data to display

## 2023-06-13 NOTE — Progress Notes (Signed)
   06/13/23 2050  BiPAP/CPAP/SIPAP  BiPAP/CPAP/SIPAP Pt Type Adult (pt declined assistance, comfortable with self-placement of home unit)  BiPAP/CPAP/SIPAP Resmed (HOME UNIT)  Mask Type Nasal pillows  FiO2 (%) 21 %  Patient Home Equipment Yes (unit plugged into red outlet)  Auto Titrate Yes (automode, min10-20cm h2o)  Safety Check Completed by RT for Home Unit Yes, no issues noted  BiPAP/CPAP /SiPAP Vitals  SpO2 95 %  Bilateral Breath Sounds Diminished;Clear

## 2023-06-13 NOTE — Progress Notes (Signed)
 PROGRESS NOTE    David Mcgrath  ZOX:[id number] DOB: Feb 14, 1951 DOA: 06/04/2023 PCP: Charlane Ferretti, DO   Brief Narrative:  David Mcgrath is a 73 y.o. male with past medical history significant for basal cell carcinoma, HTN, HLD, neuropathy, gout, anxiety/depression, GERD who presented to [facility], ED on 06/04/2023 with an acute exacerbation of what appears to be a somewhat chronic and progressive dyspnea.  Wife indicates upwards of 9 months of respiratory symptoms most notably worsening over the past week with associated fatigue, poor oral intake, right-sided abdominal pain, nausea/vomiting/diarrhea. He was seen by his PCP and most recently Pulmonology Marchelle Gearing) in Jan 2025 for his cough. Symptoms were attributed to bronchitis and patient has been given steroids and antibiotics, he reports have these have not helped. Denies chest pain, palpitations, fevers, chills, or known sick contacts.   Assessment & Plan: Principal Problem:   Pneumonia Active Problems:   Ileus (HCC)   Acute hypoxic respiratory failure (HCC)   Flu   Essential hypertension   Fatty liver   AKI (acute kidney injury) (HCC)   Hyperlipidemia   Diarrhea  Acute Hypoxic Respiratory Failure, resolving Influenza A Aspiration Pneumonia Severe sepsis secondary to above Rule out underlying COPD Sleep apnea, obstructive -Noted febrile tachypnea, leukocytosis with obvious pneumonia on imaging -Acute hypoxia with AKI meeting criteria for severe sepsis -Continue supportive care, previously treated with azithromycin in the outpatient setting with no improvement as well as steroid taper recently for concern over underlying COPD -DC linezolid (MRSA negative), continue cefepime given previous failure of azithromycin -Supportive care, nebs, incentive spirometry, flutter as tolerated -No indication for Tamiflu given patient is outside the window for treatment -Remote history of smoking, unlikely underlying COPD however  given chronicity of patient's respiratory symptoms as above for "months" we will initiate inhaled and systemic steroids -follow-up with Dr. Marchelle Gearing as scheduled outpatient -Continue cpap at home settings (no oxygen required at baseline) -Encourage ambulation, patient off oxygen at rest, hopefully will be able to wean oxygen with exertion over the next few days.   Acute ileus, improving Diarrhea, improving Repeat CT abdomen suggestive of ileus with no clear transition point for obstruction -contrast noted in the colon -continues to have diarrhea/flatus -Nausea well controlled at this time on Zofran   Acute Kidney Injury: resolved Without history of CKD  Peak creatinine and intake 1.95, downtrending - now within normal limits Hold home ARB -blood pressure currently well-controlled despite being off medication Urine output low over the past 24 hours, encourage p.o. fluids, consider small IV bolus if necessary   Hepatic Steatosis Elevated LFTs -- AST 96>79>59>62 -- ALT 49>43>41>47 -- Tbili 0.6>0.8>0.5 -- Hold home statin -Stabilizing, follow-up outpatient for repeat labs as appropriate   Hyperlipidemia --Holding statin given above  Hypertension -Hold ARB given above, currently well-controlled on as needed hydralazine   Anxiety/depression: Fluoxetine 20 mg twice daily   Neuropathy -- Gabapentin 600 mg p.o. 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  Recent colonoscopy with no concerning findings per patient.  CT chest, abdomen/pelvis with no findings suggestive of primary malignancy.  Alkaline phosphatase within normal limits.  Unclear etiology of osseous lesions.  Would likely benefit from outpatient follow-up with PCP/oncology and would likely need outpatient biopsy, PET scan for further workup. -- SPEP/UPEP ordered and pending   Weakness/debility/deconditioning: - PT/OT currently recommending SNF  placement - patient prefers DC home if at all possible with home  health - TOC consulted  DVT prophylaxis: enoxaparin (LOVENOX) injection 40 mg Start: 06/04/23 2200 Code Status:   Code Status: Full Code Family Communication: Wife at bedside  Status is: Inpatient  Dispo: The patient is from: Home              Anticipated d/c is to: To be determined, likely SNF              Anticipated d/c date is: 48 to 72 hours              Patient currently not medically stable for discharge  Consultants:  None  Procedures:  None  Antimicrobials:  cefepime x 5 days Linezolid discontinued, MRSA negative  Subjective: No acute issues or events overnight, now having bowel movements, formed without further diarrhea or constipation.  Objective: Vitals:   06/12/23 2005 06/12/23 2010 06/12/23 2131 06/13/23 0517  BP:   (!) 146/87 (!) 153/66  Pulse: 64  70 73  Resp: 16  17 18   Temp:   98.7 F (37.1 C) 97.8 F (36.6 C)  TempSrc:      SpO2: 97% 96% 97% 92%  Weight:      Height:        Intake/Output Summary (Last 24 hours) at 06/13/2023 0749 Last data filed at 06/13/2023 0610 Gross per 24 hour  Intake 900 ml  Output 1000 ml  Net -100 ml   Filed Weights   06/07/23 1820  Weight: 113.5 kg    Examination:  General:  Pleasantly resting in bed, No acute distress. HEENT:  Normocephalic atraumatic.  Sclerae nonicteric, noninjected.  Extraocular movements intact bilaterally. Neck:  Without mass or deformity.  Trachea is midline. Lungs: Diffuse rhonchi bilaterally. Heart:  Regular rate and rhythm.  Without murmurs, rubs, or gallops. Abdomen:  Soft, nontender, nondistended.  Without guarding or rebound. Extremities: Without cyanosis, clubbing, edema, or obvious deformity. Skin:  Warm and dry, no erythema.  Data Reviewed: I have personally reviewed following labs and imaging studies  CBC: Recent Labs  Lab 06/07/23 0533 06/08/23 0520 06/09/23 0458 06/10/23 1246  WBC 7.7 9.8 11.8* 14.7*   HGB 10.5* 11.0* 10.9* 11.0*  HCT 32.7* 33.7* 32.8* 34.0*  MCV 99.7 96.8 96.2 97.1  PLT 194 238 244 291   Basic Metabolic Panel: Recent Labs  Lab 06/07/23 0533 06/08/23 0520 06/09/23 0458 06/11/23 0520  NA 135 138 137 133*  K 3.8 4.3 4.0 4.4  CL 109 108 107 106  CO2 18* 19* 20* 20*  GLUCOSE 105* 78 70 127*  BUN 19 17 15 20   CREATININE 0.85 0.87 0.83 0.82  CALCIUM 7.9* 8.5* 8.2* 8.6*   GFR: Estimated Creatinine Clearance: 113.9 mL/min (by C-G formula based on SCr of 0.82 mg/dL). Liver Function Tests: Recent Labs  Lab 06/07/23 0533 06/08/23 0520 06/09/23 0458  AST 59* 62* 57*  ALT 41 47* 46*  ALKPHOS 40 43 47  BILITOT 0.5 0.7 1.1  PROT 5.0* 5.4* 5.5*  ALBUMIN 2.2* 2.4* 2.3*   Sepsis Labs: Recent Labs  Lab 06/09/23 0458  PROCALCITON 0.39    Recent Results (from the past 240 hours)  Resp panel by RT-PCR (RSV, Flu A&B, Covid) Anterior Nasal Swab     Status: Abnormal   Collection Time: 06/04/23 10:17 AM   Specimen: Anterior Nasal Swab  Result Value Ref Range Status   SARS Coronavirus 2 by RT PCR NEGATIVE NEGATIVE Final    Comment: (NOTE) SARS-CoV-2 target nucleic acids are NOT DETECTED.  The SARS-CoV-2 RNA  is generally detectable in upper respiratory specimens during the acute phase of infection. The lowest concentration of SARS-CoV-2 viral copies this assay can detect is 138 copies/mL. A negative result does not preclude SARS-Cov-2 infection and should not be used as the sole basis for treatment or other patient management decisions. A negative result may occur with  improper specimen collection/handling, submission of specimen other than nasopharyngeal swab, presence of viral mutation(s) within the areas targeted by this assay, and inadequate number of viral copies(<138 copies/mL). A negative result must be combined with clinical observations, patient history, and epidemiological information. The expected result is Negative.  Fact Sheet for Patients:   BloggerCourse.com  Fact Sheet for Healthcare Providers:  SeriousBroker.it  This test is no t yet approved or cleared by the [place] FDA and  has been authorized for detection and/or diagnosis of SARS-CoV-2 by FDA under an Emergency Use Authorization (EUA). This EUA will remain  in effect (meaning this test can be used) for the duration of the COVID-19 declaration under Section 564(b)(1) of the Act, 21 U.S.C.section 360bbb-3(b)(1), unless the authorization is terminated  or revoked sooner.       Influenza A by PCR POSITIVE (A) NEGATIVE Final   Influenza B by PCR NEGATIVE NEGATIVE Final    Comment: (NOTE) The Xpert Xpress SARS-CoV-2/FLU/RSV plus assay is intended as an aid in the diagnosis of influenza from Nasopharyngeal swab specimens and should not be used as a sole basis for treatment. Nasal washings and aspirates are unacceptable for Xpert Xpress SARS-CoV-2/FLU/RSV testing.  Fact Sheet for Patients: BloggerCourse.com  Fact Sheet for Healthcare Providers: SeriousBroker.it  This test is not yet approved or cleared by the [place] FDA and has been authorized for detection and/or diagnosis of SARS-CoV-2 by FDA under an Emergency Use Authorization (EUA). This EUA will remain in effect (meaning this test can be used) for the duration of the COVID-19 declaration under Section 564(b)(1) of the Act, 21 U.S.C. section 360bbb-3(b)(1), unless the authorization is terminated or revoked.     Resp Syncytial Virus by PCR NEGATIVE NEGATIVE Final    Comment: (NOTE) Fact Sheet for Patients: BloggerCourse.com  Fact Sheet for Healthcare Providers: SeriousBroker.it  This test is not yet approved or cleared by the [place] FDA and has been authorized for detection and/or diagnosis of SARS-CoV-2 by FDA under an Emergency Use  Authorization (EUA). This EUA will remain in effect (meaning this test can be used) for the duration of the COVID-19 declaration under Section 564(b)(1) of the Act, 21 U.S.C. section 360bbb-3(b)(1), unless the authorization is terminated or revoked.  Performed at [facility], [address], [place], [place] [address]   Culture, blood (routine x 2)     Status: Abnormal   Collection Time: 06/04/23  2:44 PM   Specimen: BLOOD  Result Value Ref Range Status   Specimen Description   Final    BLOOD RIGHT ANTECUBITAL Performed at [facility], [address], [place], [place] [address]    Special Requests   Final    BOTTLES DRAWN AEROBIC AND ANAEROBIC Blood Culture adequate volume Performed at [facility], [address], [place], [place] [address]    Culture  Setup Time   Final    GRAM POSITIVE COCCI IN CLUSTERS AEROBIC BOTTLE ONLY CRITICAL RESULT CALLED TO, READ BACK BY AND VERIFIED WITH: PHARMD SYDNEY DAVIS [id number] AT 1556 BY EC    Culture (A)  Final    STAPHYLOCOCCUS EPIDERMIDIS THE SIGNIFICANCE OF  ISOLATING THIS ORGANISM FROM A SINGLE SET OF BLOOD CULTURES WHEN MULTIPLE SETS ARE DRAWN IS UNCERTAIN. PLEASE NOTIFY THE MICROBIOLOGY DEPARTMENT WITHIN ONE WEEK IF SPECIATION AND SENSITIVITIES ARE REQUIRED. Performed at [facility], [address] [place], [place] [address]    Report Status 06/06/2023 FINAL  Final  Blood Culture ID Panel (Reflexed)     Status: Abnormal   Collection Time: 06/04/23  2:44 PM  Result Value Ref Range Status   Enterococcus faecalis NOT DETECTED NOT DETECTED Final   Enterococcus Faecium NOT DETECTED NOT DETECTED Final   Listeria monocytogenes NOT DETECTED NOT DETECTED Final   Staphylococcus species DETECTED (A) NOT DETECTED Final    Comment: CRITICAL RESULT CALLED TO, READ BACK BY AND VERIFIED WITH: PHARMD SYDNEY DAVIS [id number] AT 1556Y EC    Staphylococcus aureus (BCID) NOT DETECTED NOT  DETECTED Final   Staphylococcus epidermidis DETECTED (A) NOT DETECTED Final    Comment: Methicillin (oxacillin) resistant coagulase negative staphylococcus. Possible blood culture contaminant (unless isolated from more than one blood culture draw or clinical case suggests pathogenicity). No antibiotic treatment is indicated for blood  culture contaminants. CRITICAL RESULT CALLED TO, READ BACK BY AND VERIFIED WITH: PHARMD SYDNEY DAVIS [id number] AT 1556 BY EC    Staphylococcus lugdunensis NOT DETECTED NOT DETECTED Final   Streptococcus species NOT DETECTED NOT DETECTED Final   Streptococcus agalactiae NOT DETECTED NOT DETECTED Final   Streptococcus pneumoniae NOT DETECTED NOT DETECTED Final   Streptococcus pyogenes NOT DETECTED NOT DETECTED Final   A.calcoaceticus-baumannii NOT DETECTED NOT DETECTED Final   Bacteroides fragilis NOT DETECTED NOT DETECTED Final   Enterobacterales NOT DETECTED NOT DETECTED Final   Enterobacter cloacae complex NOT DETECTED NOT DETECTED Final   Escherichia coli NOT DETECTED NOT DETECTED Final   Klebsiella aerogenes NOT DETECTED NOT DETECTED Final   Klebsiella oxytoca NOT DETECTED NOT DETECTED Final   Klebsiella pneumoniae NOT DETECTED NOT DETECTED Final   Proteus species NOT DETECTED NOT DETECTED Final   Salmonella species NOT DETECTED NOT DETECTED Final   Serratia marcescens NOT DETECTED NOT DETECTED Final   Haemophilus influenzae NOT DETECTED NOT DETECTED Final   Neisseria meningitidis NOT DETECTED NOT DETECTED Final   Pseudomonas aeruginosa NOT DETECTED NOT DETECTED Final   Stenotrophomonas maltophilia NOT DETECTED NOT DETECTED Final   Candida albicans NOT DETECTED NOT DETECTED Final   Candida auris NOT DETECTED NOT DETECTED Final   Candida glabrata NOT DETECTED NOT DETECTED Final   Candida krusei NOT DETECTED NOT DETECTED Final   Candida parapsilosis NOT DETECTED NOT DETECTED Final   Candida tropicalis NOT DETECTED NOT DETECTED Final   Cryptococcus  neoformans/gattii NOT DETECTED NOT DETECTED Final   Methicillin resistance mecA/C DETECTED (A) NOT DETECTED Final    Comment: CRITICAL RESULT CALLED TO, READ BACK BY AND VERIFIED WITH: PHARMD SYDNEY DAVIS [id number] AT 1556 BY EC Performed at [facility], [address], [place], [place] [address]   Culture, blood (routine x 2)     Status: None   Collection Time: 06/04/23  2:47 PM   Specimen: BLOOD  Result Value Ref Range Status   Specimen Description   Final    BLOOD LEFT ANTECUBITAL Performed at [facility], [address] [place], [place] [address]    Special Requests   Final    BOTTLES DRAWN AEROBIC AND ANAEROBIC Blood Culture results may not be optimal due to an inadequate volume of blood received in culture bottles Performed at [facility], [address] [place], [place] [address]    Culture   Final    NO GROWTH 5 DAYS Performed at [facility], [address], [place], [place] [address]    Report Status 06/09/2023 FINAL  Final  MRSA Next Gen by PCR, Nasal     Status: None   Collection Time: 06/09/23  1:01 PM   Specimen: Nasal Mucosa; Nasal Swab  Result Value Ref Range Status   MRSA by PCR Next Gen NOT DETECTED NOT DETECTED Final    Comment: (NOTE) The GeneXpert MRSA Assay (FDA approved for NASAL specimens only), is one component of a comprehensive MRSA colonization surveillance program. It is not intended to diagnose MRSA infection nor to guide or monitor treatment for MRSA infections. Test performance is not FDA approved in patients less than 63 years old. Performed at [facility], [address] [place], [place] [address]          Radiology Studies: No results found.  Scheduled Meds:  allopurinol  300 mg Oral Daily   budesonide (PULMICORT) nebulizer solution  0.5 mg Nebulization Q12H   enoxaparin (LOVENOX) injection  40 mg Subcutaneous QHS   feeding supplement  1 Container Oral TID BM    FLUoxetine  20 mg Oral BID   gabapentin  600 mg Oral BID   guaiFENesin  600 mg Oral BID   pantoprazole  40 mg Oral Daily   predniSONE  40 mg Oral Q breakfast   Continuous Infusions:  ceFEPime (MAXIPIME) IV Stopped (06/13/23 0221)     LOS: 9 days   Time spent:  Azucena Fallen, DO [facility]  If 7PM-7AM, please contact night-coverage [email or web address]  06/13/2023, 7:49 AM

## 2023-06-13 NOTE — Progress Notes (Signed)
 Physical Therapy Treatment Patient Details Name: David Mcgrath MRN: [id number] DOB: 02/15/1951 Today's Date: 06/13/2023   History of Present Illness 73 yo male admitted with asp pna, aki, flu+, weakness. Hx of basal cell Ca, neuropathy, gout, anxiety/depression    PT Comments  Pt agreeable to work with therapy. Resting O2: 95% on RA; O2 with activity: 84% on 4L. Pt continues to have coughing spells. Frequent rest breaks taken as needed to allow for O2 sat recovery and coughing spell to calm. Sit to stands x 4, stand pivot x 2, standing and marching x 10 reps each leg-all with rest breaks in between. Assisted pt back to bed. Encouraged pt to perform bed level APs, QSs, and SLRs as tolerated. Pt would also benefit from sitting EOB intermittently throughout the day with nursing and/or mobility team.  *Strongly recommend continued post acute rehab--patient will benefit from continued inpatient follow up therapy, <3 hours/day. Pt was Independent/Modified Independent at baseline--used walking stick outside of home--went to the gym a couple times a week. He is currently nowhere near his baseline. He has good rehab potential.*   If plan is discharge home, recommend the following: A lot of help with walking and/or transfers;A lot of help with bathing/dressing/bathroom;Assistance with cooking/housework;Assist for transportation;Help with stairs or ramp for entrance   Can travel by private vehicle        Equipment Recommendations  Rolling walker (2 wheels);Oceanographer for Other Services       Precautions / Restrictions Precautions Precautions: Fall Precaution/Restrictions Comments: monitor O2;coughing spells Restrictions Weight Bearing Restrictions Per Provider Order: No     Mobility  Bed Mobility Overal bed mobility: Needs Assistance Bed Mobility: Supine to Sit, Sit to Supine     Supine to sit: Contact guard, HOB elevated, Used rails Sit to  supine: Min assist, HOB elevated, Used rails   General bed mobility comments: Increased time. severe coughing spell once sitting eob. Assist for LEs onto bed. Cues provided as needed    Transfers Overall transfer level: Needs assistance Equipment used: Rolling walker (2 wheels) Transfers: Sit to/from Stand, Bed to chair/wheelchair/BSC Sit to Stand: Mod assist, From elevated surface Stand pivot transfers: Mod assist         General transfer comment: Multiple sit to stands for strengthening, activity tolerance. Assist to power up, steady, control descent. Stand pivot, bed<>bsc, with RW-pt attempts to sit prematurely. Fall risk.    Ambulation/Gait               General Gait Details: Nt-not yet able   Stairs             Wheelchair Mobility     Tilt Bed    Modified Rankin (Stroke Patients Only)       Balance Overall balance assessment: Needs assistance Sitting-balance support: Feet supported Sitting balance-Leahy Scale: Fair     Standing balance support: Reliant on assistive device for balance, During functional activity, Bilateral upper extremity supported Standing balance-Leahy Scale: Poor                              Communication Communication Communication: No apparent difficulties  Cognition Arousal: Alert Behavior During Therapy: WFL for tasks assessed/performed   PT - Cognitive impairments: No apparent impairments                         Following commands: Intact  Cueing Cueing Techniques: Verbal cues  Exercises      General Comments        Pertinent Vitals/Pain Pain Assessment Pain Assessment: No/denies pain    Home Living                          Prior Function            PT Goals (current goals can now be found in the care plan section) Progress towards PT goals: Progressing toward goals    Frequency    Min 2X/week      PT Plan      Co-evaluation              AM-PAC  PT "6 Clicks" Mobility   Outcome Measure  Help needed turning from your back to your side while in a flat bed without using bedrails?: A Lot Help needed moving from lying on your back to sitting on the side of a flat bed without using bedrails?: A Lot Help needed moving to and from a bed to a chair (including a wheelchair)?: A Lot Help needed standing up from a chair using your arms (e.g., wheelchair or bedside chair)?: A Lot Help needed to walk in hospital room?: Total Help needed climbing 3-5 steps with a railing? : Total 6 Click Score: 10    End of Session Equipment Utilized During Treatment: Gait belt;Oxygen Activity Tolerance: Patient limited by fatigue Patient left: in bed;with call bell/phone within reach;with bed alarm set;with family/visitor present   PT Visit Diagnosis: Muscle weakness (generalized) (M62.81);Difficulty in walking, not elsewhere classified (R26.2)     Time: 0454-0981 PT Time Calculation (min) (ACUTE ONLY): 61 min  Charges:    $Therapeutic Activity: 53-67 mins PT General Charges $$ ACUTE PT VISIT: 1 Visit                         Faye Ramsay, PT Acute Rehabilitation  Office: [phone]

## 2023-06-14 DIAGNOSIS — K760 Fatty (change of) liver, not elsewhere classified: Secondary | ICD-10-CM | POA: Diagnosis not present

## 2023-06-14 DIAGNOSIS — N179 Acute kidney failure, unspecified: Secondary | ICD-10-CM | POA: Diagnosis not present

## 2023-06-14 DIAGNOSIS — J9601 Acute respiratory failure with hypoxia: Secondary | ICD-10-CM | POA: Diagnosis not present

## 2023-06-14 DIAGNOSIS — I10 Essential (primary) hypertension: Secondary | ICD-10-CM | POA: Diagnosis not present

## 2023-06-14 LAB — BASIC METABOLIC PANEL
Anion gap: 11 (ref 5–15)
BUN: 20 mg/dL (ref 8–23)
CO2: 21 mmol/L — ABNORMAL LOW (ref 22–32)
Calcium: 9.2 mg/dL (ref 8.9–10.3)
Chloride: 102 mmol/L (ref 98–111)
Creatinine, Ser: 0.66 mg/dL (ref 0.61–1.24)
GFR, Estimated: >60 mL/min (ref >60)
Glucose, Bld: 105 mg/dL — ABNORMAL HIGH (ref 70–99)
Potassium: 4.3 mmol/L (ref 3.5–5.1)
Sodium: 134 mmol/L — ABNORMAL LOW (ref 135–145)

## 2023-06-14 NOTE — Progress Notes (Signed)
 Occupational Therapy Treatment Patient Details Name: David Mcgrath MRN: [id number] DOB: 03/02/1951 Today's Date: 06/14/2023   History of present illness 73 yo male admitted with asp pna, aki, flu+, weakness. Hx of basal cell Ca, neuropathy, gout, anxiety/depression   OT comments  Patient continues to progress incrementally with skilled OT this afternoon. Patient with activity and levels outlined below.  OT continues to monitor and pt is limited to bed. EOB and commode and recliner placed next to bed for SPT with RW, coughing exacerbation wiht O2 sats remaining >88% on RA from baseline 95% at rest this session. OT reinforced breathing techniques, use of flutter device, and progressed light EOB UE therex with single light "cane exercises" 10 reps each for scap, sh, elbow and encouraged to continue 3x per day bed level as able between visits using personal back scratcher. Patient will benefit from continued Acute level OT to progress activity tolerance and strength for BADL's and functional mobility. Patient will benefit from continued inpatient follow up therapy, <3 hours/day.       If plan is discharge home, recommend the following:  Two people to help with walking and/or transfers;A lot of help with bathing/dressing/bathroom;Two people to help with bathing/dressing/bathroom;Assist for transportation;Help with stairs or ramp for entrance   Equipment Recommendations  None recommended by OT       Precautions / Restrictions Precautions Precautions: Fall Precaution/Restrictions Comments: monitor O2;coughing spells Restrictions Weight Bearing Restrictions Per Provider Order: No       Mobility Bed Mobility Overal bed mobility: Needs Assistance Bed Mobility: Supine to Sit, Sit to Supine     Supine to sit: Contact guard, HOB elevated, Used rails Sit to supine: Contact guard assist   General bed mobility comments: ues for breathing integration and incremental mobility     Transfers Overall transfer level: Needs assistance Equipment used: Rolling walker (2 wheels) Transfers: Sit to/from Stand, Bed to chair/wheelchair/BSC Sit to Stand: Mod assist, From elevated surface Stand pivot transfers: Mod assist               Balance Overall balance assessment: Needs assistance Sitting-balance support: Feet supported Sitting balance-Leahy Scale: Fair     Standing balance support: Reliant on assistive device for balance, During functional activity, Bilateral upper extremity supported Standing balance-Leahy Scale: Poor                             ADL either performed or assessed with clinical judgement   ADL Overall ADL's : Needs assistance/impaired Eating/Feeding: Set up   Grooming: Set up;Standing;Sitting   Upper Body Bathing: Set up;Sitting;Bed level   Lower Body Bathing: Maximal assistance;Bed level   Upper Body Dressing : Moderate assistance;Sitting Upper Body Dressing Details (indicate cue type and reason): rear and front opening gowns Lower Body Dressing: Total assistance Lower Body Dressing Details (indicate cue type and reason): cues for trials with figure 4 techniques with breathing integration but coughing exacarbated Toilet Transfer: Moderate assistance;Stand-pivot             General ADL Comments: Limited to bed. EOB and commode and recliner placed next to bed for SPT with RW, coughing exacerbation wiht O2 sats remaining >88% on RA from baseline 95% at rest    Extremity/Trunk Assessment Upper Extremity Assessment Upper Extremity Assessment: Generalized weakness   Lower Extremity Assessment Lower Extremity Assessment: Generalized weakness        Vision   Vision Assessment?: No apparent visual deficits   Perception  Perception Perception: Within Functional Limits   Praxis Praxis Praxis: WFL   Communication Communication Communication: No apparent difficulties   Cognition Arousal: Alert Behavior During  Therapy: WFL for tasks assessed/performed Cognition: No apparent impairments                               Following commands: Intact        Cueing   Cueing Techniques: Verbal cues  Exercises Exercises:  (breathihng with cane exercises B UE scap, sh, elbow with pursed lip breathing)       General Comments rash noted in groin    Pertinent Vitals/ Pain       Pain Assessment Pain Assessment: No/denies pain Faces Pain Scale: No hurt   Frequency  Min 1X/week        Progress Toward Goals  OT Goals(current goals can now be found in the care plan section)  Progress towards OT goals: Progressing toward goals  Acute Rehab OT Goals Patient Stated Goal: to get to rehab OT Goal Formulation: With patient Time For Goal Achievement: 06/22/23 Potential to Achieve Goals: Good ADL Goals Pt Will Perform Lower Body Bathing: with min assist;sit to/from stand Pt Will Perform Lower Body Dressing: with min assist;sitting/lateral leans;sit to/from stand Pt Will Transfer to Toilet: with min assist;ambulating Additional ADL Goal #1: Pt to verbalize at least 3 energy conservation strategies to implement at home.   AM-PAC OT "6 Clicks" Daily Activity     Outcome Measure   Help from another person eating meals?: A Little Help from another person taking care of personal grooming?: A Little Help from another person toileting, which includes using toliet, bedpan, or urinal?: Total Help from another person bathing (including washing, rinsing, drying)?: A Lot Help from another person to put on and taking off regular upper body clothing?: A Little Help from another person to put on and taking off regular lower body clothing?: Total 6 Click Score: 13    End of Session Equipment Utilized During Treatment: Rolling walker (2 wheels);Gait belt;Oxygen  OT Visit Diagnosis: Other abnormalities of gait and mobility (R26.89);Muscle weakness (generalized) (M62.81);Unsteadiness on feet  (R26.81);Other (comment)   Activity Tolerance Treatment limited secondary to medical complications (Comment)   Patient Left in bed   Nurse Communication Other (comment)        Time: 1340-1410 OT Time Calculation (min): 30 min  Charges: OT General Charges $OT Visit: 1 Visit OT Treatments $Self Care/Home Management : 23-37 mins  Bridgett Hattabaugh OT/L Acute Rehabilitation Department  [phone]   06/14/2023, 2:28 PM

## 2023-06-14 NOTE — Progress Notes (Signed)
 Pharmacy Antibiotic Note  David Mcgrath is a 73 y.o. male admitted on 06/04/2023 with pneumonia.  Pharmacy has been consulted for Cefepime dosing.  ID: Flu, PNA. Day #7 of cefepime. MD notes failure of azithro. Afebrile, WBC 15.6 trending up up (on steroids)  Antimicrobials:  Azithromycin 3/2 - 3/6 Unasyn 3/2 - 3/4; 3/6 x 2 Augmentin 3/5 x2 Linezolid 3/6>>3/8 Cefepime 3/6>>  Microbiology results: - 3/2 BCx: 1/4 Bcx bottles growing staph epi with mecA resistance (MRSE). Likely a contaminant.  - 3/2 positive for influenza A+ - Strep Pneumo UA: neg - MRSA PCR neg  Plan: Con't Cefepime 2g IV q8hr. Pharmacy will sign off. Please reconsult for further dosing assitance.     Height: 6\' 4"  (193 cm) Weight: 113.5 kg (250 lb 4.8 oz) IBW/kg (Calculated) : 86.8  Temp (24hrs), Avg:98.4 F (36.9 C), Min:97.5 F (36.4 C), Max:99.1 F (37.3 C)  Recent Labs  Lab 06/08/23 0520 06/09/23 0458 06/10/23 1246 06/11/23 0520 06/13/23 1149 06/14/23 0536  WBC 9.8 11.8* 14.7*  --  15.6*  --   CREATININE 0.87 0.83  --  0.82  --  0.66    Estimated Creatinine Clearance: 116.8 mL/min (by C-G formula based on SCr of 0.66 mg/dL).    Allergies  Allergen Reactions   Shrimp [Shellfish Allergy] Anaphylaxis and Swelling    Swelling throat     Raquel Sayres S. Merilynn Finland, PharmD, BCPS Clinical Staff Pharmacist  Misty Stanley Stillinger 06/14/2023 10:30 AM

## 2023-06-14 NOTE — TOC Progression Note (Addendum)
 Transition of Care [facility]) - Progression Note    Patient Details  Name: David Mcgrath MRN: [id number] Date of Birth: [birth year]  [facility] CM/SW Contact  Shyvonne Chastang, [name], RN Phone Number: 06/14/2023, 9:17 AM  Clinical Narrative: Updated PT clinical notes submitted to insurance [employer] await outcome-likely peer to peer needed needed since high  02 requirements.Pending Berkley Harvey OZ#[id number] [facility] rep Lowella Bandy updated also. -3:34p still awaiting [employer] med review outcome case has not been picked up yet per rep Inetta Fermo.awaiting review.      Expected Discharge Plan: Skilled Nursing Facility Barriers to Discharge: Insurance Authorization  Expected Discharge Plan and Services                                               Social Determinants of Health (SDOH) Interventions SDOH Screenings   Food Insecurity: No Food Insecurity (06/04/2023)  Housing: Low Risk  (06/04/2023)  Transportation Needs: No Transportation Needs (06/04/2023)  Utilities: Not At Risk (06/04/2023)  Social Connections: Socially Integrated (06/04/2023)  Tobacco Use: Low Risk  (06/04/2023)    Readmission Risk Interventions     No data to display

## 2023-06-14 NOTE — Progress Notes (Signed)
   06/14/23 2014  BiPAP/CPAP/SIPAP  BiPAP/CPAP/SIPAP Pt Type Adult (pt comfortable with self-placement of home unit, declined assistance)  BiPAP/CPAP/SIPAP Resmed  Mask Type Nasal pillows  FiO2 (%) 21 %  Patient Home Equipment Yes (unit plugged into red outlet)  Auto Titrate Yes (automode, min10-20cm h2o)  Safety Check Completed by RT for Home Unit Yes, no issues noted

## 2023-06-14 NOTE — Progress Notes (Signed)
 PROGRESS NOTE    David Mcgrath  ZOX:[id number] DOB: 01/31/1951 DOA: 06/04/2023 PCP: Charlane Ferretti, DO   Brief Narrative: No notes on file   Assessment and Plan:  Acute respiratory failure with hypoxia Secondary to pneumonia. Weaned to room air at rest. Still requiring oxygen with ambulation. Requiring up to 2 L/min today. -Continue to ambulate with pulse ox  Severe sepsis Does not appear this was present on admission. Secondary to aspiration pneumonia with associated hypoxia. Blood cultures positive for staphylococcus epidermidis, assessed as likely contaminant.  Aspiration pneumonia Patient treated with Unasyn with transition to Cefepime.  Influenza A infection Out of window for Tamiflu. Managed with supportive care only.  OSA -CPAP  Acute ileus Resolved.  Diarrhea Resolved.  AKI Unclear baseline prior to admission. Creatinine of 1.95 on admission with peak of 2.11 with improvement after IV fluids. Baseline appears to be between 0.6-0.8.  Hepatic steatosis Noted. Elevated LFTs likely related to acute illness/infection with improvement.  Hyperlipidemia Home statin held secondary to elevated LFTs  Primary hypertension ARB held. -Continue hydralazine  Anxiety Depression -Continue Prozac  Neuropathy -Continue gabapentin  Multiple osseous lesions Noted incidentally on imaging. SPEP not suggestive of multiple myeloma. UPEP pending. Patient will need outpatient medical oncology follow-up.     DVT prophylaxis: Lovenox Code Status:   Code Status: Full Code Family Communication: Wife at bedside Disposition Plan: Discharge to SNF pending bed availability and insurance authorization   Consultants:    Procedures:    Antimicrobials:     Subjective: Still with a chronic cough, for which he is being managed by pulmonology as an outpatient. No other specific concerns.  Objective: BP 129/67   Pulse 68   Temp 97.9 F (36.6 C)   Resp 20   Ht  6\' 4"  (1.93 m)   Wt 113.5 kg   SpO2 95%   BMI 30.47 kg/m   Examination:  General exam: Appears calm and comfortable Respiratory system: Clear to auscultation. Respiratory effort normal. Cardiovascular system: S1 & S2 heard, RRR.  Gastrointestinal system: Abdomen is nondistended, soft and nontender.  Normal bowel sounds heard. Central nervous system: Alert and oriented. No focal neurological deficits. Psychiatry: Judgement and insight appear normal. Mood & affect appropriate.    Data Reviewed: I have personally reviewed following labs and imaging studies  CBC Lab Results  Component Value Date   WBC 15.6 (H) 06/13/2023   RBC 3.34 (L) 06/13/2023   HGB 10.7 (L) 06/13/2023   HCT 31.9 (L) 06/13/2023   MCV 95.5 06/13/2023   MCH 32.0 06/13/2023   PLT 350 06/13/2023   MCHC 33.5 06/13/2023   RDW 13.5 06/13/2023   LYMPHSABS 0.5 (L) 06/04/2023   MONOABS 0.4 06/04/2023   EOSABS 0.0 06/04/2023   BASOSABS 0.0 06/04/2023     Last metabolic panel Lab Results  Component Value Date   NA 134 (L) 06/14/2023   K 4.3 06/14/2023   CL 102 06/14/2023   CO2 21 (L) 06/14/2023   BUN 20 06/14/2023   CREATININE 0.66 06/14/2023   GLUCOSE 105 (H) 06/14/2023   GFRNONAA >60 06/14/2023   CALCIUM 9.2 06/14/2023   PHOS 2.2 (L) 06/06/2023   PROT 5.5 (L) 06/09/2023   ALBUMIN 2.3 (L) 06/09/2023   LABGLOB 2.5 06/09/2023   AGRATIO 0.9 06/09/2023   BILITOT 1.1 06/09/2023   ALKPHOS 47 06/09/2023   AST 57 (H) 06/09/2023   ALT 46 (H) 06/09/2023   ANIONGAP 11 06/14/2023    GFR: Estimated Creatinine Clearance: 116.8 mL/min (by  C-G formula based on SCr of 0.66 mg/dL).  Recent Results (from the past 240 hours)  MRSA Next Gen by PCR, Nasal     Status: None   Collection Time: 06/09/23  1:01 PM   Specimen: Nasal Mucosa; Nasal Swab  Result Value Ref Range Status   MRSA by PCR Next Gen NOT DETECTED NOT DETECTED Final    Comment: (NOTE) The GeneXpert MRSA Assay (FDA approved for NASAL specimens  only), is one component of a comprehensive MRSA colonization surveillance program. It is not intended to diagnose MRSA infection nor to guide or monitor treatment for MRSA infections. Test performance is not FDA approved in patients less than 5 years old. Performed at [facility], [address], [place], [place] [address]       Radiology Studies: No results found.    LOS: 10 days    Jacquelin Hawking, MD [facility] 06/14/2023, 4:33 PM   If 7PM-7AM, please contact night-coverage [email or web address]

## 2023-06-15 DIAGNOSIS — Z7401 Bed confinement status: Secondary | ICD-10-CM | POA: Diagnosis not present

## 2023-06-15 DIAGNOSIS — R2689 Other abnormalities of gait and mobility: Secondary | ICD-10-CM | POA: Diagnosis not present

## 2023-06-15 DIAGNOSIS — I10 Essential (primary) hypertension: Secondary | ICD-10-CM | POA: Diagnosis not present

## 2023-06-15 DIAGNOSIS — J189 Pneumonia, unspecified organism: Secondary | ICD-10-CM | POA: Diagnosis not present

## 2023-06-15 DIAGNOSIS — G4733 Obstructive sleep apnea (adult) (pediatric): Secondary | ICD-10-CM | POA: Diagnosis not present

## 2023-06-15 DIAGNOSIS — E785 Hyperlipidemia, unspecified: Secondary | ICD-10-CM | POA: Diagnosis not present

## 2023-06-15 DIAGNOSIS — J111 Influenza due to unidentified influenza virus with other respiratory manifestations: Secondary | ICD-10-CM | POA: Diagnosis not present

## 2023-06-15 DIAGNOSIS — G608 Other hereditary and idiopathic neuropathies: Secondary | ICD-10-CM | POA: Diagnosis not present

## 2023-06-15 DIAGNOSIS — J101 Influenza due to other identified influenza virus with other respiratory manifestations: Secondary | ICD-10-CM | POA: Diagnosis not present

## 2023-06-15 DIAGNOSIS — M6281 Muscle weakness (generalized): Secondary | ICD-10-CM | POA: Diagnosis not present

## 2023-06-15 DIAGNOSIS — G5601 Carpal tunnel syndrome, right upper limb: Secondary | ICD-10-CM | POA: Diagnosis not present

## 2023-06-15 DIAGNOSIS — I951 Orthostatic hypotension: Secondary | ICD-10-CM | POA: Diagnosis not present

## 2023-06-15 DIAGNOSIS — M899 Disorder of bone, unspecified: Secondary | ICD-10-CM | POA: Diagnosis not present

## 2023-06-15 DIAGNOSIS — J9601 Acute respiratory failure with hypoxia: Secondary | ICD-10-CM | POA: Diagnosis not present

## 2023-06-15 DIAGNOSIS — R1314 Dysphagia, pharyngoesophageal phase: Secondary | ICD-10-CM | POA: Diagnosis not present

## 2023-06-15 DIAGNOSIS — J690 Pneumonitis due to inhalation of food and vomit: Secondary | ICD-10-CM | POA: Diagnosis not present

## 2023-06-15 DIAGNOSIS — N179 Acute kidney failure, unspecified: Secondary | ICD-10-CM | POA: Diagnosis not present

## 2023-06-15 DIAGNOSIS — R531 Weakness: Secondary | ICD-10-CM | POA: Diagnosis not present

## 2023-06-15 DIAGNOSIS — R2681 Unsteadiness on feet: Secondary | ICD-10-CM | POA: Diagnosis not present

## 2023-06-15 DIAGNOSIS — K560 Paralytic ileus: Secondary | ICD-10-CM | POA: Diagnosis not present

## 2023-06-15 DIAGNOSIS — M109 Gout, unspecified: Secondary | ICD-10-CM | POA: Diagnosis not present

## 2023-06-15 DIAGNOSIS — G629 Polyneuropathy, unspecified: Secondary | ICD-10-CM | POA: Diagnosis not present

## 2023-06-15 DIAGNOSIS — K219 Gastro-esophageal reflux disease without esophagitis: Secondary | ICD-10-CM | POA: Diagnosis not present

## 2023-06-15 DIAGNOSIS — K567 Ileus, unspecified: Secondary | ICD-10-CM | POA: Diagnosis not present

## 2023-06-15 DIAGNOSIS — F419 Anxiety disorder, unspecified: Secondary | ICD-10-CM | POA: Diagnosis not present

## 2023-06-15 DIAGNOSIS — F32A Depression, unspecified: Secondary | ICD-10-CM | POA: Diagnosis not present

## 2023-06-15 LAB — CBC
HCT: 33.2 % — ABNORMAL LOW (ref 39.0–52.0)
Hemoglobin: 10.9 g/dL — ABNORMAL LOW (ref 13.0–17.0)
MCH: 31.9 pg (ref 26.0–34.0)
MCHC: 32.8 g/dL (ref 30.0–36.0)
MCV: 97.1 fL (ref 80.0–100.0)
Platelets: 376 10*3/uL (ref 150–400)
RBC: 3.42 MIL/uL — ABNORMAL LOW (ref 4.22–5.81)
RDW: 13.7 % (ref 11.5–15.5)
WBC: 11.8 10*3/uL — ABNORMAL HIGH (ref 4.0–10.5)
nRBC: 0 % (ref 0.0–0.2)

## 2023-06-15 MED ORDER — GABAPENTIN 300 MG PO CAPS: 600.0000 mg | ORAL_CAPSULE | Freq: Two times a day (BID) | ORAL | Status: DC

## 2023-06-15 MED ORDER — GUAIFENESIN ER 600 MG PO TB12: 600.0000 mg | ORAL_TABLET | Freq: Two times a day (BID) | ORAL | Status: DC

## 2023-06-15 MED ORDER — HYDROCODONE BIT-HOMATROP MBR 5-1.5 MG/5ML PO SOLN: 5.0000 mL | Freq: Four times a day (QID) | ORAL | 0 refills | Status: AC | PRN

## 2023-06-15 MED ORDER — BENZONATATE 200 MG PO CAPS: 200.0000 mg | ORAL_CAPSULE | Freq: Three times a day (TID) | ORAL | Status: DC | PRN

## 2023-06-15 NOTE — Progress Notes (Signed)
 Physical Therapy Treatment Patient Details Name: David Mcgrath MRN: [id number] DOB: [birth year] Today's Date: 06/15/2023   History of Present Illness 73 yo male admitted with asp pna, aki, flu+, weakness. Hx of basal cell Ca, neuropathy, gout, anxiety/depression    PT Comments  Pt assisted with performing a couple sit to stands for technique and activity tolerance.  Pt with multiple coughing spells with activity but able to progress to ambulating short distance in hallway today.  Pt's SPO2 dropped to 83% on room air so pt returned to room and applied 2L O2 [place] for SPO2 to improve to 91% (with RN aware).  Pt does not present at baseline - which is modified independent (with cane), going to gym 3x week prior to admission per his report.  Patient will benefit from continued inpatient follow up therapy, <3 hours/day.     If plan is discharge home, recommend the following: A lot of help with walking and/or transfers;A lot of help with bathing/dressing/bathroom;Assistance with cooking/housework;Assist for transportation;Help with stairs or ramp for entrance   Can travel by private vehicle        Equipment Recommendations  Rolling walker (2 wheels);Wheelchair (measurements PT);Wheelchair cushion (measurements PT)    Recommendations for Other Services       Precautions / Restrictions Precautions Precautions: Fall Precaution/Restrictions Comments: monitor O2;coughing spells     Mobility  Bed Mobility Overal bed mobility: Needs Assistance Bed Mobility: Supine to Sit     Supine to sit: Supervision, Used rails, HOB elevated          Transfers Overall transfer level: Needs assistance Equipment used: Rolling walker (2 wheels) Transfers: Sit to/from Stand Sit to Stand: Min assist, From elevated surface   Step pivot transfers: +2 safety/equipment       General transfer comment: performed a couple sit to stands for rest breaks and technique, cues for hand placement; assist to  rise and stabilize    Ambulation/Gait Ambulation/Gait assistance: Min assist, +2 safety/equipment Gait Distance (Feet): 15 Feet Assistive device: Rolling walker (2 wheels) Gait Pattern/deviations: Step-through pattern, Decreased stride length, Trunk flexed       General Gait Details: verbal cues for RW positioning, distance to tolerance; fatigued quickly, increased work of breathing, SpO2 dropped to 83%, returned to room in recliner and applied 2L O2 [place] and SPO2 improved to 91% within a couple minutes - Tax inspector     Tilt Bed    Modified Rankin (Stroke Patients Only)       Balance                                            Communication Communication Communication: No apparent difficulties  Cognition Arousal: Alert Behavior During Therapy: WFL for tasks assessed/performed   PT - Cognitive impairments: No apparent impairments                         Following commands: Intact      Cueing    Exercises      General Comments        Pertinent Vitals/Pain Pain Assessment Pain Assessment: No/denies pain Pain Intervention(s): Repositioned, Monitored during session    Home Living  Prior Function            PT Goals (current goals can now be found in the care plan section) Progress towards PT goals: Progressing toward goals    Frequency    Min 2X/week      PT Plan      Co-evaluation              AM-PAC PT "6 Clicks" Mobility   Outcome Measure  Help needed turning from your back to your side while in a flat bed without using bedrails?: A Lot Help needed moving from lying on your back to sitting on the side of a flat bed without using bedrails?: A Lot Help needed moving to and from a bed to a chair (including a wheelchair)?: A Lot Help needed standing up from a chair using your arms (e.g., wheelchair or bedside chair)?: A Lot Help  needed to walk in hospital room?: A Lot Help needed climbing 3-5 steps with a railing? : Total 6 Click Score: 11    End of Session Equipment Utilized During Treatment: Gait belt Activity Tolerance: Patient limited by fatigue Patient left: with call bell/phone within reach;in chair;with chair alarm set Nurse Communication: Mobility status PT Visit Diagnosis: Muscle weakness (generalized) (M62.81);Difficulty in walking, not elsewhere classified (R26.2)     Time: 1610-9604 PT Time Calculation (min) (ACUTE ONLY): 21 min  Charges:    $Gait Training: 8-22 mins PT General Charges $$ ACUTE PT VISIT: 1 Visit                    Paulino Door, DPT Physical Therapist Acute Rehabilitation Services Office: [phone]    Janan Halter [place] 06/15/2023, 11:37 AM

## 2023-06-15 NOTE — TOC Transition Note (Addendum)
 Transition of Care [facility]) - Discharge Note   Patient Details  Name: David Mcgrath MRN: [id number] Date of Birth: Sep 10, 1950  Transition [facility] CM/SW Contact:  Lanier Clam, RN Phone Number: 06/15/2023, 1:22 PM   Clinical Narrative: Iline Oven post P2P for [facility] rep Lowella Bandy has bed available going to rm#[id number], report #[phone].Liz(spouse) in agreement for PTAR & d/c. PTAR called. No further CM needs.     Final next level of care: Skilled Nursing Facility Barriers to Discharge: No Barriers Identified   Patient Goals and CMS Choice Patient states their goals for this hospitalization and ongoing recovery are:: Rehab CMS Medicare.gov Compare Post Acute Care list provided to:: Patient Choice offered to / list presented to : Patient [place] ownership interest in [facility].provided to:: Patient    Discharge Placement              Patient chooses bed at: [facility] Patient to be transferred to facility by: PTAR Name of family member notified: Liz(spouse) Patient and family notified of of transfer: 06/15/23  Discharge Plan and Services Additional resources added to the After Visit Summary for                                       Social Drivers of Health (SDOH) Interventions SDOH Screenings   Food Insecurity: No Food Insecurity (06/04/2023)  Housing: Low Risk  (06/04/2023)  Transportation Needs: No Transportation Needs (06/04/2023)  Utilities: Not At Risk (06/04/2023)  Social Connections: Socially Integrated (06/04/2023)  Tobacco Use: Low Risk  (06/04/2023)     Readmission Risk Interventions     No data to display

## 2023-06-15 NOTE — Discharge Summary (Signed)
 Physician Discharge Summary   Patient: David Mcgrath MRN: [id number] DOB: Dec 25, 1950  Admit date:     06/04/2023  Discharge date: 06/15/23  Discharge Physician: Jacquelin Hawking, MD   PCP: Charlane Ferretti, DO   Recommendations at discharge:  PCP visit for hospital follow-up Medical oncology follow-up for lytic bone lesions  Discharge Diagnoses: Principal Problem:   Pneumonia Active Problems:   Ileus (HCC)   Acute hypoxic respiratory failure (HCC)   Flu   Essential hypertension   Fatty liver   AKI (acute kidney injury) (HCC)   Hyperlipidemia   Diarrhea  Resolved Problems:   * No resolved hospital problems. *  Hospital Course: David Mcgrath is a 73 y.o. male with a history of basal cell carcinoma, hypertension, hyperlipidemia, neuropathy, gout, anxiety, depression, GERD.  Patient presented secondary to progressively worsened dyspnea and found to have evidence of multifocal infiltrates with concern for possible aspiration pneumonia, but also concerning for atypic infection; patient has been treated as an outpatient and follows with pulmonology. Patient started on Unasyn IV with transition to Cefepime and completion of antibiotics prior to discharge. During hospitalization, CT imaging was incidentally significant for multiple osseus lesions; patient referred to medical oncology.  Assessment and Plan:  Acute respiratory failure with hypoxia Secondary to pneumonia. Weaned to room air at rest. Still requiring oxygen with ambulation. Requiring up to 2 L/min with ambulation. Continue attempts to wean to room air as an outpatient.   Severe sepsis Does not appear this was present on admission. Secondary to aspiration pneumonia with associated hypoxia. Blood cultures positive for staphylococcus epidermidis, assessed as likely contaminant.   Aspiration pneumonia Patient treated with Unasyn with transition to Cefepime. Antibiotics completed prior to discharge.   Influenza A  infection Out of window for Tamiflu. Managed with supportive care only.   OSA CPAP.   Acute ileus Resolved.   Diarrhea Resolved.   AKI Unclear baseline prior to admission. Creatinine of 1.95 on admission with peak of 2.11 with improvement after IV fluids. Baseline appears to be between 0.6-0.8.   Hepatic steatosis Noted. Elevated LFTs likely related to acute illness/infection with improvement.   Hyperlipidemia Home statin held secondary to elevated LFTs   Primary hypertension Continue home olmesartan.   Anxiety Depression Continue Prozac   Neuropathy Continue gabapentin   Multiple osseous lesions Noted incidentally on imaging involving left fourth rib, right ilium and L5 vertebral body. SPEP not suggestive of multiple myeloma. UPEP pending. Patient will need outpatient medical oncology follow-up.   Consultants: None Procedures performed: Modified barium swallow  Disposition: Skilled nursing facility Diet recommendation: Soft diet   DISCHARGE MEDICATION: Allergies as of 06/15/2023       Reactions   Shrimp [shellfish Allergy] Anaphylaxis, Swelling   Swelling throat         Medication List     TAKE these medications    allopurinol 300 MG tablet Commonly known as: ZYLOPRIM Take 300 mg by mouth daily.   benzonatate 200 MG capsule Commonly known as: TESSALON Take 1 capsule (200 mg total) by mouth 3 (three) times daily as needed for cough.   ezetimibe 10 MG tablet Commonly known as: ZETIA Take 10 mg by mouth every evening.   FLUoxetine 20 MG tablet Commonly known as: PROZAC Take 20 mg by mouth daily.   gabapentin 300 MG capsule Commonly known as: NEURONTIN Take 2 capsules (600 mg total) by mouth 2 (two) times daily. What changed:  how much to take when to take this additional instructions  guaiFENesin 600 MG 12 hr tablet Commonly known as: MUCINEX Take 1 tablet (600 mg total) by mouth 2 (two) times daily.   HYDROcodone bit-homatropine 5-1.5  MG/5ML syrup Commonly known as: HYCODAN Take 5 mLs by mouth every 6 (six) hours as needed for up to 5 days for cough (not relieved with tessalon).   melatonin 5 MG Tabs Take 5 mg by mouth at bedtime.   olmesartan 20 MG tablet Commonly known as: BENICAR Take 10 mg by mouth daily.   omeprazole 40 MG capsule Commonly known as: PRILOSEC Take 40 mg by mouth every morning.   pravastatin 80 MG tablet Commonly known as: PRAVACHOL Take 80 mg by mouth at bedtime.   pyridoxine 100 MG tablet Commonly known as: B-6 Take 100 mg by mouth daily.   Vitamin B12 1000 MCG Tbcr Take 1,000 mcg by mouth daily.   Vitamin D-3 125 MCG (5000 UT) Tabs Take 5,000 Units by mouth daily.        Contact information for follow-up providers     Charlane Ferretti, DO. Schedule an appointment as soon as possible for a visit in 1 week(s).   Specialty: Internal Medicine Contact information: [address] [place] [address] [phone]         [facility] - A Dept Of [place]. Schedule an appointment as soon as possible for a visit.   Specialty: Oncology Why: Further work-up of bone lesions noted on imaging studies while in the hospital Contact information: [address] [place] [address] [phone]             Contact information for after-discharge care     Destination     [facility] Preferred SNF .   Service: Skilled Nursing Contact information: [address] [place] [address] [phone]                    Discharge Exam: BP (!) 155/77 (BP Location: Left Arm)   Pulse 74   Temp 98 F (36.7 C)   Resp 20   Ht 6\' 4"  (1.93 m)   Wt 113.5 kg   SpO2 97%   BMI 30.47 kg/m   General exam: Appears calm and comfortable Respiratory system: Clear to auscultation. Respiratory effort normal. Cardiovascular system: S1 & S2 heard, RRR. No murmurs, rubs, gallops or  clicks. Gastrointestinal system: Abdomen is nondistended, soft and nontender. Normal bowel sounds heard. Central nervous system: Alert and oriented. No focal neurological deficits. Psychiatry: Judgement and insight appear normal. Mood & affect appropriate.   Condition at discharge: stable  The results of significant diagnostics from this hospitalization (including imaging, microbiology, ancillary and laboratory) are listed below for reference.   Imaging Studies: CT CHEST ABDOMEN PELVIS W CONTRAST Result Date: 06/08/2023 CLINICAL DATA:  Abdominal pain. Dyspnea. No lytic bone lesions. * Tracking Code: BO * EXAM: CT CHEST, ABDOMEN, AND PELVIS WITH CONTRAST TECHNIQUE: Multidetector CT imaging of the chest, abdomen and pelvis was performed following the standard protocol during bolus administration of intravenous contrast. RADIATION DOSE REDUCTION: This exam was performed according to the departmental dose-optimization program which includes automated exposure control, adjustment of the mA and/or kV according to patient size and/or use of iterative reconstruction technique. CONTRAST:  OMNIPAQUE IOHEXOL 300 MG/ML  SOLN COMPARISON:  CTA chest 06/04/2023. Standard abdomen pelvis CT with contrast 06/04/2023. FINDINGS: CT CHEST FINDINGS Cardiovascular: Heart is nonenlarged. No pericardial effusion. Coronary artery  calcifications are seen. There are calcifications along the aortic valve. The thoracic aorta is normal course and caliber. There is a bovine type aortic arch. Mediastinum/Nodes: Small thyroid gland. Slightly patulous thoracic esophagus. No specific abnormal lymph node enlargement identified in the axillary region or hila. There are some borderline mediastinal nodes identified, unchanged from the previous examination. Example precarinal node has a short axis of 10 mm. Small subcarinal and paratracheal nodes. Lungs/Pleura: No pneumothorax. Developing tiny right pleural effusion greater than left.  Extensive breathing motion throughout the examination. Once again there are areas of ground-glass opacities in the upper lobes which are increasing. There is also ill-defined parenchymal opacity along the lower lobes. The left lower lobe opacity is slightly less confluent and consolidative but there is significant residual. Similar changes along the right lower lobe. Musculoskeletal: Scattered degenerative changes along the spine. There are several scattered lucent bone lesions identified. Examples include the posterior aspect of the left fourth rib, posterolateral aspect of the right sixth rib. Also some areas along the thoracic spine such as right transverse process of T1. Please correlate for any known history of malignancy or myeloma. CT ABDOMEN PELVIS FINDINGS Hepatobiliary: No space-occupying liver lesion. Gallbladder is distended with some high density material, possible vicarious excretion of previous contrast administration. Patent portal vein. Pancreas: Mild global pancreatic atrophy.  No obvious mass. Spleen: Spleen is not enlarged. Adrenals/Urinary Tract: Adrenal glands are unremarkable. Kidneys are normal, without renal calculi, focal lesion, or hydronephrosis. Bladder is unremarkable. Stomach/Bowel: Oral contrast was administered. Stomach is nondilated. Once again there are some dilated loops of small bowel in left mid abdomen. Few air-fluid levels. Loops measure up to 4 cm in diameter. Oral contrast is seen extending into some portions of the colon. There are decompressed loops in the right mid abdomen of small bowel. Large bowel is nondilated. Vascular/Lymphatic: Aortic atherosclerosis. No enlarged abdominal or pelvic lymph nodes. Reproductive: Prostate is unremarkable. Left testicle is along the left inguinal region. Other: Anasarca. Motion. There is a left-sided posterior abdominal wall hernia at the level of the left kidney containing fat. Musculoskeletal: Degenerative changes along the spine.  Once again there are some scattered lytic lesions along the pelvis and lumbar spine. IMPRESSION: Once again scattered bony lytic lesions identified diffusely. Please correlate for any known history including an no malignancy such as myeloma. Changing lung opacities. Increasing ground-glass areas along both upper lungs. The confluent areas in the lower lobes are less confluent today but there is significant residual. There are increasing small pleural effusions, right-greater-than-left. Persistent mildly dilated loops of small bowel in the left midabdomen. No abrupt transition and there is some contrast crossing into the colon. There are decompressed loops of small bowel in the right lower quadrant. Please correlate for ileus or very low-grade obstruction. Recommend continued follow up surveillance. Evaluation limited by motion. Electronically Signed   By: Karen Kays M.D.   On: 06/08/2023 14:07   DG Swallowing Func-Speech Pathology Result Date: 06/05/2023 Table formatting from the original result was not included. Modified Barium Swallow Study Patient Details Name: Dashiel Bergquist MRN: [id number] Date of Birth: October 12, 1950 Today's Date: 06/05/2023 HPI/PMH: HPI: Patient is a 73 y.o. male with PMH: GERD, chronic idiopathic neuropathy, HTN, HLD, anxiety, depression. He presented to [facility] ED with 6-7 day h/o increased dyspnea, fatigue, poor PO intake, right sided abdominal pain, nausea, vomiting, dairrhea and ongoing dry cough since June of 2024. In Ed he was afebrile, HR 40, RR 23, SpO2 89% on RA, improved to 94%  on 2-3L oxygen via nasal cannula. CXR showed predominant interstitial coarsening suspicious for viral Mycoplasma pneumonia.  CTA PE obtained and negative for pulmonary embolus does show airspace opacities throughout the bilateral lung bases with as well as esophagus that is thickened and partially fluid filled suggestive of reflux and aspiration. CT abd/pelvis obtained and shows multiple loopss of gas and fluid  distended small bowel consistent with ileus, no transition point. 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 Patient is NPO, awaiting SLP evaluation of swallow function. Clinical Impression: Clinical Impression: Patient presents with a primary pharyngoesophageal dysphagia as per this MBS. Prominent cricopharyngeal bar observed (no radiologist present to confirm). No penetration or aspiration of any of the tested consistencies was observed during any phase of the swallow. Swallow intiation was delayed at level of vallecular sinus with puree solids, honey thick liquids, posterior laryngeal surface of the epiglottis and at times, pyriform sinus with nectar thick and thin liquids. Anterior hyoid excursion, laryngeal elevation and epiglottic inversion were all complete. Laryngeal vestibular closure was incomplete. Trace to mild residuals remained in vallecular sinus, posterior pharyngeal wall and pyriform sinus with solids and liquids, however cleared with subsequent swallows. When swallowing 13mm barium tablet with thin liquid barium, patient did have difficulty with bolus cohesion. Prior to tablet transiting with swallow of thin liquid, patient had an instance of gagging versus feeling of regurgitation which resulted in discoordination of swallow. Tablet was seen falling into pharynx and resting on PES in absence of an active swallow response. Barium tablet then transited through PES and esophagus. Barium tablet slowed distally and distal retrograde movement of barium and barium stasis observed. SLP is recommending advance to regular solids, thin liquids when cleared by GI. No further skilled SLP services needed at this time. Factors that may increase risk of adverse event in presence of aspiration Rubye Oaks & Clearance Coots 2021): Factors that may increase risk of adverse event in presence of aspiration Rubye Oaks & Clearance Coots 2021): Respiratory or GI  disease Recommendations/Plan: Swallowing Evaluation Recommendations Swallowing Evaluation Recommendations Recommendations: PO diet PO Diet Recommendation: Regular; Thin liquids (Level 0) Liquid Administration via: Cup; Straw Medication Administration: Whole meds with puree Supervision: Patient able to self-feed Swallowing strategies  : Slow rate; Small bites/sips Postural changes: Position pt fully upright for meals; Stay upright 30-60 min after meals Oral care recommendations: Oral care BID (2x/day) Treatment Plan Treatment Plan Treatment recommendations: No treatment recommended at this time Follow-up recommendations: No SLP follow up Functional status assessment: Patient has had a recent decline in their functional status and demonstrates the ability to make significant improvements in function in a reasonable and predictable amount of time. Recommendations Recommendations for follow up therapy are one component of a multi-disciplinary discharge planning process, led by the attending physician.  Recommendations may be updated based on patient status, additional functional criteria and insurance authorization. Assessment: Orofacial Exam: Orofacial Exam Oral Cavity - Dentition: Adequate natural dentition Anatomy: Anatomy: Prominent cricopharyngeus Boluses Administered: Boluses Administered Boluses Administered: Thin liquids (Level 0); Mildly thick liquids (Level 2, nectar thick); Moderately thick liquids (Level 3, honey thick); Puree  Oral Impairment Domain: Oral Impairment Domain Lip Closure: No labial escape Tongue control during bolus hold: Not tested Bolus transport/lingual motion: Brisk tongue motion Oral residue: Trace residue lining oral structures Location of oral residue : Tongue Initiation of pharyngeal swallow : Posterior laryngeal surface of the epiglottis; Pyriform sinuses  Pharyngeal Impairment Domain: Pharyngeal Impairment Domain Soft  palate elevation: No bolus between soft palate (SP)/pharyngeal  wall (PW) Laryngeal elevation: Complete superior movement of thyroid cartilage with complete approximation of arytenoids to epiglottic petiole Anterior hyoid excursion: Complete anterior movement Epiglottic movement: Complete inversion Laryngeal vestibule closure: Incomplete, narrow column air/contrast in laryngeal vestibule Pharyngeal stripping wave : Present - complete Pharyngeal contraction (A/P view only): N/A Pharyngoesophageal segment opening: Partial distention/partial duration, partial obstruction of flow Tongue base retraction: No contrast between tongue base and posterior pharyngeal wall (PPW) Pharyngeal residue: Collection of residue within or on pharyngeal structures Location of pharyngeal residue: Pharyngeal wall; Valleculae; Pyriform sinuses; Tongue base  Esophageal Impairment Domain: Esophageal Impairment Domain Esophageal clearance upright position: Complete clearance, esophageal coating Pill: Pill Consistency administered: Thin liquids (Level 0) Thin liquids (Level 0): Impaired (see clinical impressions) Penetration/Aspiration Scale Score: Penetration/Aspiration Scale Score 1.  Material does not enter airway: Thin liquids (Level 0); Mildly thick liquids (Level 2, nectar thick); Moderately thick liquids (Level 3, honey thick); Puree; Pill Compensatory Strategies: Compensatory Strategies Compensatory strategies: No   General Information: No data recorded Diet Prior to this Study: Thin liquids (Level 0)   No data recorded  Respiratory Status: WFL   Supplemental O2: Nasal cannula   History of Recent Intubation: No  Behavior/Cognition: Alert; Cooperative; Pleasant mood Self-Feeding Abilities: Able to self-feed Baseline vocal quality/speech: Normal Volitional Cough: Able to elicit Volitional Swallow: Able to elicit Exam Limitations: No limitations Goal Planning: Prognosis for improved oropharyngeal function: Good No data recorded No data recorded Patient/Family Stated Goal: mouth is dry, would like some  oral fluids Consulted and agree with results and recommendations: Patient Pain: Pain Assessment Pain Assessment: No/denies pain End of Session: Start Time:SLP Start Time (ACUTE ONLY): 1240 Stop Time: SLP Stop Time (ACUTE ONLY): 1255 Time Calculation:SLP Time Calculation (min) (ACUTE ONLY): 15 min Charges: SLP Evaluations $ SLP Speech Visit: 1 Visit SLP Evaluations $BSS Swallow: 1 Procedure $MBS Swallow: 1 Procedure SLP visit diagnosis: SLP Visit Diagnosis: Dysphagia, pharyngoesophageal phase (R13.14) Past Medical History: Past Medical History: Diagnosis Date  Neuropathy  Past Surgical History: No past surgical history on file. Angela Nevin, MA, CCC-SLP Speech Therapy   CT Angio Chest PE W and/or Wo Contrast Result Date: 06/04/2023 CLINICAL DATA:  Abdominal pain, emesis, diarrhea, right lower abdominal pain and guarding for 1 week, shortness of breath, PE suspected * Tracking Code: BO * EXAM: CT ANGIOGRAPHY CHEST CT ABDOMEN AND PELVIS WITH CONTRAST TECHNIQUE: Multidetector CT imaging of the chest was performed using the standard protocol during bolus administration of intravenous contrast. Multiplanar CT image reconstructions and MIPs were obtained to evaluate the vascular anatomy. Multidetector CT imaging of the abdomen and pelvis was performed using the standard protocol during bolus administration of intravenous contrast. RADIATION DOSE REDUCTION: This exam was performed according to the departmental dose-optimization program which includes automated exposure control, adjustment of the mA and/or kV according to patient size and/or use of iterative reconstruction technique. CONTRAST:  80mL OMNIPAQUE IOHEXOL 350 MG/ML SOLN COMPARISON:  None Available. FINDINGS: CT CHEST ANGIOGRAM FINDINGS Cardiovascular: Evaluation for pulmonary embolism is limited by breath motion artifact, particularly in the lung bases. Within this limitation, no evidence of pulmonary embolism through the proximal segmental pulmonary  arterial level. Cardiomegaly. Three-vessel coronary artery calcifications. No pericardial effusion. Mediastinum/Nodes: Prominent mediastinal and hilar lymph nodes, likely reactive to airspace disease. Partially fluid-filled, thickened esophagus. Lungs/Pleura: Very extensive heterogeneous airspace opacity throughout the bilateral lung bases no pleural effusion or pneumothorax. Musculoskeletal: No chest wall abnormality. Expansile, lytic lesion of the  left fourth rib head measuring 3.2 x 2.2 cm (series 12, image 97). Review of the MIP images confirms the above findings. CT ABDOMEN PELVIS FINDINGS Hepatobiliary: No solid liver abnormality is seen. Hepatic steatosis. No gallstones, gallbladder wall thickening, or biliary dilatation. Pancreas: Unremarkable. No pancreatic ductal dilatation or surrounding inflammatory changes. Spleen: Normal in size without significant abnormality. Adrenals/Urinary Tract: Adrenal glands are unremarkable. Kidneys are normal, without renal calculi, solid lesion, or hydronephrosis. Bladder is unremarkable. Stomach/Bowel: Stomach is within normal limits. Appendix appears normal (series 2, image 67). Multiple loops of gas and fluid distended mid small bowel measuring up to 5.1 cm in caliber (series 2, image 50). There are relatively decompressed loops of small bowel in the distal right hemiabdomen without a clear transition point, and there is gas and stool present in the colon to the rectum. Vascular/Lymphatic: Severe aortic atherosclerosis. No enlarged abdominal or pelvic lymph nodes. Reproductive: No mass or other significant abnormality. Other: No abdominal wall hernia or abnormality. No ascites. Musculoskeletal: No acute or significant osseous findings. Lytic osseous lesion of the right ilium measuring 3.9 x 2.0 cm (series 2, image 65) and of the left ilium measuring 2.7 x 2.2 cm (series 2, image 77). Possible lytic lesion versus Schmorl's node of the superior endplate of L5 (series 10,  image 109) IMPRESSION: 1. Evaluation for pulmonary embolism is limited by breath motion artifact, particularly in the lung bases. Within this limitation, no evidence of pulmonary embolism through the proximal segmental pulmonary arterial level. 2. Very extensive heterogeneous airspace opacity throughout the bilateral lung bases, consistent with infection or aspiration. The esophagus is thickened and partially fluid-filled, suggesting reflux and aspiration. 3. Prominent mediastinal and hilar lymph nodes, likely reactive to airspace disease. 4. Multiple loops of gas and fluid distended mid small bowel measuring up to 5.1 cm in caliber. Relatively decompressed loops of small bowel in the distal right hemiabdomen without a clear transition point, and there is gas and stool present in the colon to the rectum. Findings are most consistent with ileus, although partial or developing distal small bowel obstruction is not excluded. 5. 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 No candidate primary malignancy identified in the chest, abdomen, or pelvis. 6. Cardiomegaly and coronary artery disease. 7. Hepatic steatosis. Aortic Atherosclerosis (ICD10-I70.0). Electronically Signed   By: Jearld Lesch M.D.   On: 06/04/2023 12:03   CT ABDOMEN PELVIS W CONTRAST Result Date: 06/04/2023 CLINICAL DATA:  Abdominal pain, emesis, diarrhea, right lower abdominal pain and guarding for 1 week, shortness of breath, PE suspected * Tracking Code: BO * EXAM: CT ANGIOGRAPHY CHEST CT ABDOMEN AND PELVIS WITH CONTRAST TECHNIQUE: Multidetector CT imaging of the chest was performed using the standard protocol during bolus administration of intravenous contrast. Multiplanar CT image reconstructions and MIPs were obtained to evaluate the vascular anatomy. Multidetector CT imaging of the abdomen and pelvis was performed using the standard protocol during bolus administration of  intravenous contrast. RADIATION DOSE REDUCTION: This exam was performed according to the departmental dose-optimization program which includes automated exposure control, adjustment of the mA and/or kV according to patient size and/or use of iterative reconstruction technique. CONTRAST:  80mL OMNIPAQUE IOHEXOL 350 MG/ML SOLN COMPARISON:  None Available. FINDINGS: CT CHEST ANGIOGRAM FINDINGS Cardiovascular: Evaluation for pulmonary embolism is limited by breath motion artifact, particularly in the lung bases. Within this limitation, no evidence of pulmonary embolism through the proximal segmental pulmonary arterial level. Cardiomegaly. Three-vessel  coronary artery calcifications. No pericardial effusion. Mediastinum/Nodes: Prominent mediastinal and hilar lymph nodes, likely reactive to airspace disease. Partially fluid-filled, thickened esophagus. Lungs/Pleura: Very extensive heterogeneous airspace opacity throughout the bilateral lung bases no pleural effusion or pneumothorax. Musculoskeletal: No chest wall abnormality. Expansile, lytic lesion of the left fourth rib head measuring 3.2 x 2.2 cm (series 12, image 97). Review of the MIP images confirms the above findings. CT ABDOMEN PELVIS FINDINGS Hepatobiliary: No solid liver abnormality is seen. Hepatic steatosis. No gallstones, gallbladder wall thickening, or biliary dilatation. Pancreas: Unremarkable. No pancreatic ductal dilatation or surrounding inflammatory changes. Spleen: Normal in size without significant abnormality. Adrenals/Urinary Tract: Adrenal glands are unremarkable. Kidneys are normal, without renal calculi, solid lesion, or hydronephrosis. Bladder is unremarkable. Stomach/Bowel: Stomach is within normal limits. Appendix appears normal (series 2, image 67). Multiple loops of gas and fluid distended mid small bowel measuring up to 5.1 cm in caliber (series 2, image 50). There are relatively decompressed loops of small bowel in the distal right  hemiabdomen without a clear transition point, and there is gas and stool present in the colon to the rectum. Vascular/Lymphatic: Severe aortic atherosclerosis. No enlarged abdominal or pelvic lymph nodes. Reproductive: No mass or other significant abnormality. Other: No abdominal wall hernia or abnormality. No ascites. Musculoskeletal: No acute or significant osseous findings. Lytic osseous lesion of the right ilium measuring 3.9 x 2.0 cm (series 2, image 65) and of the left ilium measuring 2.7 x 2.2 cm (series 2, image 77). Possible lytic lesion versus Schmorl's node of the superior endplate of L5 (series 10, image 109) IMPRESSION: 1. Evaluation for pulmonary embolism is limited by breath motion artifact, particularly in the lung bases. Within this limitation, no evidence of pulmonary embolism through the proximal segmental pulmonary arterial level. 2. Very extensive heterogeneous airspace opacity throughout the bilateral lung bases, consistent with infection or aspiration. The esophagus is thickened and partially fluid-filled, suggesting reflux and aspiration. 3. Prominent mediastinal and hilar lymph nodes, likely reactive to airspace disease. 4. Multiple loops of gas and fluid distended mid small bowel measuring up to 5.1 cm in caliber. Relatively decompressed loops of small bowel in the distal right hemiabdomen without a clear transition point, and there is gas and stool present in the colon to the rectum. Findings are most consistent with ileus, although partial or developing distal small bowel obstruction is not excluded. 5. 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 No candidate primary malignancy identified in the chest, abdomen, or pelvis. 6. Cardiomegaly and coronary artery disease. 7. Hepatic steatosis. Aortic Atherosclerosis (ICD10-I70.0). Electronically Signed   By: Jearld Lesch M.D.   On: 06/04/2023 12:03   DG Chest Portable 1  View Result Date: 06/04/2023 CLINICAL DATA:  Cough.  Nausea.  Emesis. EXAM: PORTABLE CHEST 1 VIEW COMPARISON:  None Available. FINDINGS: 6th posterolateral right rib deformity secondary to remote fracture. Numerous leads and wires project over the chest. Midline trachea. Normal heart size for level of inspiration. No pleural effusion or pneumothorax. Nonspecific lower lung predominant interstitial coarsening. No lobar consolidation. IMPRESSION: Lower lung predominant interstitial coarsening is nonspecific, especially without priors for comparison. Given patient is a never smoker, findings are suspicious for viral or mycoplasma pneumonia. Sequelae of chronic bronchitis/asthma could look similar. Electronically Signed   By: Jeronimo Greaves M.D.   On: 06/04/2023 10:33    Microbiology: Results for orders placed or performed during the hospital encounter of 06/04/23  Resp  panel by RT-PCR (RSV, Flu A&B, Covid) Anterior Nasal Swab     Status: Abnormal   Collection Time: 06/04/23 10:17 AM   Specimen: Anterior Nasal Swab  Result Value Ref Range Status   SARS Coronavirus 2 by RT PCR NEGATIVE NEGATIVE Final    Comment: (NOTE) SARS-CoV-2 target nucleic acids are NOT DETECTED.  The SARS-CoV-2 RNA is generally detectable in upper respiratory specimens during the acute phase of infection. The lowest concentration of SARS-CoV-2 viral copies this assay can detect is 138 copies/mL. A negative result does not preclude SARS-Cov-2 infection and should not be used as the sole basis for treatment or other patient management decisions. A negative result may occur with  improper specimen collection/handling, submission of specimen other than nasopharyngeal swab, presence of viral mutation(s) within the areas targeted by this assay, and inadequate number of viral copies(<138 copies/mL). A negative result must be combined with clinical observations, patient history, and epidemiological information. The expected result is  Negative.  Fact Sheet for Patients:  BloggerCourse.com  Fact Sheet for Healthcare Providers:  SeriousBroker.it  This test is no t yet approved or cleared by the [place] FDA and  has been authorized for detection and/or diagnosis of SARS-CoV-2 by FDA under an Emergency Use Authorization (EUA). This EUA will remain  in effect (meaning this test can be used) for the duration of the COVID-19 declaration under Section 564(b)(1) of the Act, 21 U.S.C.section 360bbb-3(b)(1), unless the authorization is terminated  or revoked sooner.       Influenza A by PCR POSITIVE (A) NEGATIVE Final   Influenza B by PCR NEGATIVE NEGATIVE Final    Comment: (NOTE) The Xpert Xpress SARS-CoV-2/FLU/RSV plus assay is intended as an aid in the diagnosis of influenza from Nasopharyngeal swab specimens and should not be used as a sole basis for treatment. Nasal washings and aspirates are unacceptable for Xpert Xpress SARS-CoV-2/FLU/RSV testing.  Fact Sheet for Patients: BloggerCourse.com  Fact Sheet for Healthcare Providers: SeriousBroker.it  This test is not yet approved or cleared by the [place] FDA and has been authorized for detection and/or diagnosis of SARS-CoV-2 by FDA under an Emergency Use Authorization (EUA). This EUA will remain in effect (meaning this test can be used) for the duration of the COVID-19 declaration under Section 564(b)(1) of the Act, 21 U.S.C. section 360bbb-3(b)(1), unless the authorization is terminated or revoked.     Resp Syncytial Virus by PCR NEGATIVE NEGATIVE Final    Comment: (NOTE) Fact Sheet for Patients: BloggerCourse.com  Fact Sheet for Healthcare Providers: SeriousBroker.it  This test is not yet approved or cleared by the [place] FDA and has been authorized for detection and/or diagnosis of  SARS-CoV-2 by FDA under an Emergency Use Authorization (EUA). This EUA will remain in effect (meaning this test can be used) for the duration of the COVID-19 declaration under Section 564(b)(1) of the Act, 21 U.S.C. section 360bbb-3(b)(1), unless the authorization is terminated or revoked.  Performed at [facility], [address], [place], [place] [address]   Culture, blood (routine x 2)     Status: Abnormal   Collection Time: 06/04/23  2:44 PM   Specimen: BLOOD  Result Value Ref Range Status   Specimen Description   Final    BLOOD RIGHT ANTECUBITAL Performed at [facility], [address] [place], [place] [address]    Special Requests   Final    BOTTLES DRAWN AEROBIC AND ANAEROBIC Blood Culture adequate volume Performed at [facility], [address] [place], [place] [address]    Culture  Setup Time   Final    GRAM POSITIVE COCCI IN CLUSTERS AEROBIC BOTTLE ONLY CRITICAL RESULT CALLED TO, READ BACK BY AND VERIFIED WITH: PHARMD SYDNEY DAVIS [id number] AT 1556 BY EC    Culture (A)  Final    STAPHYLOCOCCUS EPIDERMIDIS THE SIGNIFICANCE OF ISOLATING THIS ORGANISM FROM A SINGLE SET OF BLOOD CULTURES WHEN MULTIPLE SETS ARE DRAWN IS UNCERTAIN. PLEASE NOTIFY THE MICROBIOLOGY DEPARTMENT WITHIN ONE WEEK IF SPECIATION AND SENSITIVITIES ARE REQUIRED. Performed at [facility], [address], [place], [place] [address]    Report Status 06/06/2023 FINAL  Final  Blood Culture ID Panel (Reflexed)     Status: Abnormal   Collection Time: 06/04/23  2:44 PM  Result Value Ref Range Status   Enterococcus faecalis NOT DETECTED NOT DETECTED Final   Enterococcus Faecium NOT DETECTED NOT DETECTED Final   Listeria monocytogenes NOT DETECTED NOT DETECTED Final   Staphylococcus species DETECTED (A) NOT DETECTED Final    Comment: CRITICAL RESULT CALLED TO, READ BACK BY AND VERIFIED WITH: PHARMD SYDNEY DAVIS [id number] AT 1556Y EC     Staphylococcus aureus (BCID) NOT DETECTED NOT DETECTED Final   Staphylococcus epidermidis DETECTED (A) NOT DETECTED Final    Comment: Methicillin (oxacillin) resistant coagulase negative staphylococcus. Possible blood culture contaminant (unless isolated from more than one blood culture draw or clinical case suggests pathogenicity). No antibiotic treatment is indicated for blood  culture contaminants. CRITICAL RESULT CALLED TO, READ BACK BY AND VERIFIED WITH: PHARMD SYDNEY DAVIS [id number] AT 1556 BY EC    Staphylococcus lugdunensis NOT DETECTED NOT DETECTED Final   Streptococcus species NOT DETECTED NOT DETECTED Final   Streptococcus agalactiae NOT DETECTED NOT DETECTED Final   Streptococcus pneumoniae NOT DETECTED NOT DETECTED Final   Streptococcus pyogenes NOT DETECTED NOT DETECTED Final   A.calcoaceticus-baumannii NOT DETECTED NOT DETECTED Final   Bacteroides fragilis NOT DETECTED NOT DETECTED Final   Enterobacterales NOT DETECTED NOT DETECTED Final   Enterobacter cloacae complex NOT DETECTED NOT DETECTED Final   Escherichia coli NOT DETECTED NOT DETECTED Final   Klebsiella aerogenes NOT DETECTED NOT DETECTED Final   Klebsiella oxytoca NOT DETECTED NOT DETECTED Final   Klebsiella pneumoniae NOT DETECTED NOT DETECTED Final   Proteus species NOT DETECTED NOT DETECTED Final   Salmonella species NOT DETECTED NOT DETECTED Final   Serratia marcescens NOT DETECTED NOT DETECTED Final   Haemophilus influenzae NOT DETECTED NOT DETECTED Final   Neisseria meningitidis NOT DETECTED NOT DETECTED Final   Pseudomonas aeruginosa NOT DETECTED NOT DETECTED Final   Stenotrophomonas maltophilia NOT DETECTED NOT DETECTED Final   Candida albicans NOT DETECTED NOT DETECTED Final   Candida auris NOT DETECTED NOT DETECTED Final   Candida glabrata NOT DETECTED NOT DETECTED Final   Candida krusei NOT DETECTED NOT DETECTED Final   Candida parapsilosis NOT DETECTED NOT DETECTED Final   Candida tropicalis NOT  DETECTED NOT DETECTED Final   Cryptococcus neoformans/gattii NOT DETECTED NOT DETECTED Final   Methicillin resistance mecA/C DETECTED (A) NOT DETECTED Final    Comment: CRITICAL RESULT CALLED TO, READ BACK BY AND VERIFIED WITH: PHARMD SYDNEY DAVIS [id number] AT 1556 BY EC Performed at [facility], [address] [place], [place] [address]   Culture, blood (routine x 2)     Status: None   Collection Time: 06/04/23  2:47 PM   Specimen: BLOOD  Result Value Ref Range Status   Specimen Description  Final    BLOOD LEFT ANTECUBITAL Performed at [facility], [address] [place], [place] [address]    Special Requests   Final    BOTTLES DRAWN AEROBIC AND ANAEROBIC Blood Culture results may not be optimal due to an inadequate volume of blood received in culture bottles Performed at [facility] [address] [place], [place] [address]    Culture   Final    NO GROWTH 5 DAYS Performed at [facility], [address] [place], [place] [address]    Report Status 06/09/2023 FINAL  Final  MRSA Next Gen by PCR, Nasal     Status: None   Collection Time: 06/09/23  1:01 PM   Specimen: Nasal Mucosa; Nasal Swab  Result Value Ref Range Status   MRSA by PCR Next Gen NOT DETECTED NOT DETECTED Final    Comment: (NOTE) The GeneXpert MRSA Assay (FDA approved for NASAL specimens only), is one component of a comprehensive MRSA colonization surveillance program. It is not intended to diagnose MRSA infection nor to guide or monitor treatment for MRSA infections. Test performance is not FDA approved in patients less than 79 years old. Performed at [facility], [address] [place], [place] [address]     Labs: CBC: Recent Labs  Lab 06/09/23 0458 06/10/23 1246 06/13/23 1149 06/15/23 0549  WBC 11.8* 14.7* 15.6* 11.8*  HGB 10.9* 11.0* 10.7* 10.9*  HCT 32.8* 34.0* 31.9* 33.2*  MCV 96.2 97.1 95.5 97.1  PLT 244 291 350 376    Basic Metabolic Panel: Recent Labs  Lab 06/09/23 0458 06/11/23 0520 06/14/23 0536  NA 137 133* 134*  K 4.0 4.4 4.3  CL 107 106 102  CO2 20* 20* 21*  GLUCOSE 70 127* 105*  BUN 15 20 20   CREATININE 0.83 0.82 0.66  CALCIUM 8.2* 8.6* 9.2   Liver Function Tests: Recent Labs  Lab 06/09/23 0458  AST 57*  ALT 46*  ALKPHOS 47  BILITOT 1.1  PROT 5.5*  ALBUMIN 2.3*    Discharge time spent: 35 minutes.  Signed: Jacquelin Hawking, MD [facility] 06/15/2023

## 2023-06-15 NOTE — Discharge Instructions (Signed)
 David Mcgrath,  You were in the hospital with pneumonia requiring treatment with antibiotics. This was complicated by low oxygen requiring oxygen therapy. You have significantly improved from a medical standpoint but still have some progress to make toward physical rehabilitation. You will need oxygen while ambulating, but hopefully this will resolve over the next few days to weeks. During your hospitalization, there were abnormalities seen in some of your bones; you will likely need a biopsy of one of these areas, but have referred you to a medical oncologist.

## 2023-06-15 NOTE — TOC Progression Note (Addendum)
 Transition of Care [facility]) - Progression Note    Patient Details  Name: David Mcgrath MRN: [id number] Date of Birth: 11-Nov-1950  [facility]) CM/SW Contact  [name], [name], RN Phone Number: 06/15/2023, 9:55 AM  Clinical Narrative: [name] health for update on med review-still being reviewd no additional info needed currently spoke to [name] for [facility] #[id number]. -11a   Dr. Caleb Popp peer to peer offered due by 3p today tel#[phone], option 5, Colen Eltzroth (the 3rd),mbr #[phone].Facility is [facility]. -12:20p-Dr. Messaged through amion to offer peer to peer-await outcome.   Expected Discharge Plan: Skilled Nursing Facility Barriers to Discharge: Insurance Authorization  Expected Discharge Plan and Services                                               Social Determinants of Health (SDOH) Interventions SDOH Screenings   Food Insecurity: No Food Insecurity (06/04/2023)  Housing: Low Risk  (06/04/2023)  Transportation Needs: No Transportation Needs (06/04/2023)  Utilities: Not At Risk (06/04/2023)  Social Connections: Socially Integrated (06/04/2023)  Tobacco Use: Low Risk  (06/04/2023)    Readmission Risk Interventions     No data to display

## 2023-06-15 NOTE — Plan of Care (Signed)
  Problem: Clinical Measurements: Goal: Diagnostic test results will improve Outcome: Progressing Goal: Respiratory complications will improve Outcome: Progressing Goal: Cardiovascular complication will be avoided Outcome: Progressing   Problem: Activity: Goal: Risk for activity intolerance will decrease Outcome: Progressing   Problem: Activity: Goal: Ability to tolerate increased activity will improve Outcome: Progressing   Problem: Respiratory: Goal: Ability to maintain adequate ventilation will improve Outcome: Progressing Goal: Ability to maintain a clear airway will improve Outcome: Progressing

## 2023-06-15 NOTE — Hospital Course (Signed)
 David Mcgrath is a 73 y.o. male with a history of basal cell carcinoma, hypertension, hyperlipidemia, neuropathy, gout, anxiety, depression, GERD.  Patient presented secondary to progressively worsened dyspnea and found to have evidence of multifocal infiltrates with concern for possible aspiration pneumonia, but also concerning for atypic infection; patient has been treated as an outpatient and follows with pulmonology. Patient started on Unasyn IV with transition to Cefepime and completion of antibiotics prior to discharge. During hospitalization, CT imaging was incidentally significant for multiple osseus lesions; patient referred to medical oncology.

## 2023-06-16 DIAGNOSIS — N179 Acute kidney failure, unspecified: Secondary | ICD-10-CM | POA: Diagnosis not present

## 2023-06-16 DIAGNOSIS — J189 Pneumonia, unspecified organism: Secondary | ICD-10-CM | POA: Diagnosis not present

## 2023-06-16 DIAGNOSIS — M899 Disorder of bone, unspecified: Secondary | ICD-10-CM | POA: Diagnosis not present

## 2023-06-16 DIAGNOSIS — I10 Essential (primary) hypertension: Secondary | ICD-10-CM | POA: Diagnosis not present

## 2023-06-19 ENCOUNTER — Encounter: Payer: Self-pay | Admitting: Medical Oncology

## 2023-06-19 DIAGNOSIS — R2689 Other abnormalities of gait and mobility: Secondary | ICD-10-CM | POA: Diagnosis not present

## 2023-06-19 DIAGNOSIS — R2681 Unsteadiness on feet: Secondary | ICD-10-CM | POA: Diagnosis not present

## 2023-06-19 DIAGNOSIS — M6281 Muscle weakness (generalized): Secondary | ICD-10-CM | POA: Diagnosis not present

## 2023-06-19 DIAGNOSIS — J690 Pneumonitis due to inhalation of food and vomit: Secondary | ICD-10-CM | POA: Diagnosis not present

## 2023-06-22 DIAGNOSIS — R2689 Other abnormalities of gait and mobility: Secondary | ICD-10-CM | POA: Diagnosis not present

## 2023-06-22 DIAGNOSIS — M6281 Muscle weakness (generalized): Secondary | ICD-10-CM | POA: Diagnosis not present

## 2023-06-22 DIAGNOSIS — J690 Pneumonitis due to inhalation of food and vomit: Secondary | ICD-10-CM | POA: Diagnosis not present

## 2023-06-22 DIAGNOSIS — R2681 Unsteadiness on feet: Secondary | ICD-10-CM | POA: Diagnosis not present

## 2023-06-22 LAB — UPEP/UIFE/LIGHT CHAINS/TP, 24-HR UR
% BETA, Urine: 37.7 %
ALPHA 1 URINE: 4.3 %
Albumin, U: 10.4 %
Alpha 2, Urine: 26.3 %
Free Kappa Lt Chains,Ur: 611.43 mg/L — ABNORMAL HIGH (ref 1.17–86.46)
Free Kappa/Lambda Ratio: 7.81 (ref 1.83–14.26)
Free Lambda Lt Chains,Ur: 78.28 mg/L — ABNORMAL HIGH (ref 0.27–15.21)
GAMMA GLOBULIN URINE: 21.3 %
Total Protein, Urine-Ur/day: 3 mg/(24.h) — ABNORMAL LOW (ref 30–150)
Total Protein, Urine: 95.4 mg/dL
Total Volume: 510

## 2023-06-23 DIAGNOSIS — I951 Orthostatic hypotension: Secondary | ICD-10-CM | POA: Diagnosis not present

## 2023-06-26 DIAGNOSIS — J690 Pneumonitis due to inhalation of food and vomit: Secondary | ICD-10-CM | POA: Diagnosis not present

## 2023-06-26 DIAGNOSIS — M6281 Muscle weakness (generalized): Secondary | ICD-10-CM | POA: Diagnosis not present

## 2023-06-26 DIAGNOSIS — R2681 Unsteadiness on feet: Secondary | ICD-10-CM | POA: Diagnosis not present

## 2023-06-26 DIAGNOSIS — R2689 Other abnormalities of gait and mobility: Secondary | ICD-10-CM | POA: Diagnosis not present

## 2023-06-29 DIAGNOSIS — J101 Influenza due to other identified influenza virus with other respiratory manifestations: Secondary | ICD-10-CM | POA: Diagnosis not present

## 2023-06-29 DIAGNOSIS — K560 Paralytic ileus: Secondary | ICD-10-CM | POA: Diagnosis not present

## 2023-06-29 DIAGNOSIS — J189 Pneumonia, unspecified organism: Secondary | ICD-10-CM | POA: Diagnosis not present

## 2023-06-29 DIAGNOSIS — I951 Orthostatic hypotension: Secondary | ICD-10-CM | POA: Diagnosis not present

## 2023-07-01 DIAGNOSIS — J690 Pneumonitis due to inhalation of food and vomit: Secondary | ICD-10-CM | POA: Diagnosis not present

## 2023-07-01 DIAGNOSIS — G4733 Obstructive sleep apnea (adult) (pediatric): Secondary | ICD-10-CM | POA: Diagnosis not present

## 2023-07-01 DIAGNOSIS — K219 Gastro-esophageal reflux disease without esophagitis: Secondary | ICD-10-CM | POA: Diagnosis not present

## 2023-07-01 DIAGNOSIS — M109 Gout, unspecified: Secondary | ICD-10-CM | POA: Diagnosis not present

## 2023-07-01 DIAGNOSIS — Z85828 Personal history of other malignant neoplasm of skin: Secondary | ICD-10-CM | POA: Diagnosis not present

## 2023-07-01 DIAGNOSIS — G603 Idiopathic progressive neuropathy: Secondary | ICD-10-CM | POA: Diagnosis not present

## 2023-07-01 DIAGNOSIS — E785 Hyperlipidemia, unspecified: Secondary | ICD-10-CM | POA: Diagnosis not present

## 2023-07-01 DIAGNOSIS — I10 Essential (primary) hypertension: Secondary | ICD-10-CM | POA: Diagnosis not present

## 2023-07-01 DIAGNOSIS — J101 Influenza due to other identified influenza virus with other respiratory manifestations: Secondary | ICD-10-CM | POA: Diagnosis not present

## 2023-07-01 DIAGNOSIS — K760 Fatty (change of) liver, not elsewhere classified: Secondary | ICD-10-CM | POA: Diagnosis not present

## 2023-07-01 DIAGNOSIS — M899 Disorder of bone, unspecified: Secondary | ICD-10-CM | POA: Diagnosis not present

## 2023-07-01 DIAGNOSIS — F32A Depression, unspecified: Secondary | ICD-10-CM | POA: Diagnosis not present

## 2023-07-01 DIAGNOSIS — F419 Anxiety disorder, unspecified: Secondary | ICD-10-CM | POA: Diagnosis not present

## 2023-07-04 ENCOUNTER — Ambulatory Visit: Attending: Cardiology | Admitting: Cardiology

## 2023-07-04 VITALS — BP 102/52 | HR 111 | Ht 76.0 in | Wt 213.0 lb

## 2023-07-04 DIAGNOSIS — K219 Gastro-esophageal reflux disease without esophagitis: Secondary | ICD-10-CM | POA: Diagnosis not present

## 2023-07-04 DIAGNOSIS — E785 Hyperlipidemia, unspecified: Secondary | ICD-10-CM | POA: Diagnosis not present

## 2023-07-04 DIAGNOSIS — K760 Fatty (change of) liver, not elsewhere classified: Secondary | ICD-10-CM | POA: Diagnosis not present

## 2023-07-04 DIAGNOSIS — G4733 Obstructive sleep apnea (adult) (pediatric): Secondary | ICD-10-CM | POA: Diagnosis not present

## 2023-07-04 DIAGNOSIS — G603 Idiopathic progressive neuropathy: Secondary | ICD-10-CM | POA: Diagnosis not present

## 2023-07-04 DIAGNOSIS — I480 Paroxysmal atrial fibrillation: Secondary | ICD-10-CM | POA: Diagnosis not present

## 2023-07-04 DIAGNOSIS — J101 Influenza due to other identified influenza virus with other respiratory manifestations: Secondary | ICD-10-CM | POA: Diagnosis not present

## 2023-07-04 DIAGNOSIS — I10 Essential (primary) hypertension: Secondary | ICD-10-CM

## 2023-07-04 DIAGNOSIS — J690 Pneumonitis due to inhalation of food and vomit: Secondary | ICD-10-CM | POA: Diagnosis not present

## 2023-07-04 DIAGNOSIS — F419 Anxiety disorder, unspecified: Secondary | ICD-10-CM | POA: Diagnosis not present

## 2023-07-04 DIAGNOSIS — F32A Depression, unspecified: Secondary | ICD-10-CM | POA: Diagnosis not present

## 2023-07-04 DIAGNOSIS — M899 Disorder of bone, unspecified: Secondary | ICD-10-CM | POA: Diagnosis not present

## 2023-07-04 DIAGNOSIS — G629 Polyneuropathy, unspecified: Secondary | ICD-10-CM | POA: Insufficient documentation

## 2023-07-04 DIAGNOSIS — Z85828 Personal history of other malignant neoplasm of skin: Secondary | ICD-10-CM | POA: Diagnosis not present

## 2023-07-04 DIAGNOSIS — M109 Gout, unspecified: Secondary | ICD-10-CM | POA: Diagnosis not present

## 2023-07-04 HISTORY — DX: Paroxysmal atrial fibrillation: I48.0

## 2023-07-04 MED ORDER — APIXABAN 5 MG PO TABS: 5.0000 mg | ORAL_TABLET | Freq: Two times a day (BID) | ORAL | Status: DC

## 2023-07-04 MED ORDER — APIXABAN 5 MG PO TABS: 5.0000 mg | ORAL_TABLET | Freq: Two times a day (BID) | ORAL | 12 refills | Status: DC

## 2023-07-04 MED ORDER — DIGOXIN 125 MCG PO TABS: ORAL_TABLET | ORAL | 3 refills | Status: DC

## 2023-07-04 MED ORDER — DIGOXIN 125 MCG PO TABS: 0.1250 mg | ORAL_TABLET | Freq: Every day | ORAL | 3 refills | Status: DC

## 2023-07-04 NOTE — Progress Notes (Signed)
 Cardiology Office Note:    Date:  07/04/2023   ID:  David Mcgrath, DOB 07-05-1950, MRN [id number]  PCP:  Charlane Ferretti, DO  Cardiologist:  Garwin Brothers, MD   Referring MD: Charlane Ferretti, DO    ASSESSMENT:    1. Hyperlipidemia, unspecified hyperlipidemia type   2. Essential hypertension   3. Paroxysmal atrial fibrillation (HCC)    PLAN:    In order of problems listed above:  Primary prevention stressed with the patient.  Importance of compliance with diet medication stressed and patient verbalized standing. Newly diagnosed atrial fibrillation:I discussed with the patient atrial fibrillation, disease process. Management and therapy including rate and rhythm control, anticoagulation benefits and potential risks were discussed extensively with the patient. Patient had multiple questions which were answered to patient's satisfaction.  Patient has neuropathy and his gait is not the most stable however he is very careful and has never had a fall.  He also uses his walker carefully.  He has felt much better after this blood pressure medicine has been discontinued.  He also was educated about benefits and risks of anticoagulation and for now we will initiate him on anticoagulation.  He is anemic, we will do blood work today and also get him Ifob for follow-up. He will be initiated on digoxin 0.25 mg daily for a week and we will drop the dose to half dose after this. Cardiac murmur: Echocardiogram will be done to assess murmur heard on auscultation. He was advised to keep himself well-hydrated.  He will be seen in follow-up appointment in 2 to 3 weeks or earlier if the symptoms sounds.  At that time we will discuss and evaluate him for cardioversion based on the clinical follow-up and echo report.    Medication Adjustments/Labs and Tests Ordered: Current medicines are reviewed at length with the patient today.  Concerns regarding medicines are outlined above.  Orders Placed This  Encounter  Procedures   EKG 12-Lead   No orders of the defined types were placed in this encounter.    History of Present Illness:    David Mcgrath is a 73 y.o. male who is being seen today for the evaluation of paroxysmal atrial fibrillation at the request of Charlane Ferretti, DO.  Patient is a pleasant 73 year old male.  Has past medical history of recently diagnosed pneumonitis.  He denies any problems at this time and takes care of activities of daily living.  His wife mentions to me that he gets out of breath when he exerts.  He appears well-hydrated.  When he stands up blood pressure drops therefore blood pressure medicine has been discontinued and better.  At the time of my evaluation, the patient is alert awake oriented and in no distress.  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  Patient reports he does not have symptoms as of 02/25/14.     Diarrhea 06/04/2023   Essential hypertension 06/04/2023   Fatty liver 06/04/2023   Flu 06/04/2023   Hyperlipidemia 06/04/2023   Ileus (HCC) 06/04/2023   Neuropathy    Pneumonia 06/04/2023    Past Surgical History:  Procedure Laterality Date   NO PAST SURGERIES      Current Medications: Current Meds  Medication Sig   allopurinol (ZYLOPRIM) 300 MG  tablet Take 300 mg by mouth daily.   Cholecalciferol (VITAMIN D-3) 125 MCG (5000 UT) TABS Take 5,000 Units by mouth daily.   Cyanocobalamin (VITAMIN B12) 1000 MCG TBCR Take 1,000 mcg by mouth daily.   ezetimibe (ZETIA) 10 MG tablet Take 10 mg by mouth every evening.   FLUoxetine (PROZAC) 20 MG tablet Take 20 mg by mouth daily.   gabapentin (NEURONTIN) 300 MG capsule Take 2 capsules (600 mg total) by mouth 2 (two) times daily.   melatonin 5 MG TABS Take 5 mg by mouth at  bedtime.   pravastatin (PRAVACHOL) 80 MG tablet Take 80 mg by mouth at bedtime.   pyridoxine (B-6) 100 MG tablet Take 100 mg by mouth daily.     Allergies:   Shrimp [shellfish allergy]   Social History   Socioeconomic History   Marital status: Married    Spouse name: Lanora Manis   Number of children: 0   Years of education: Not on file   Highest education level: Not on file  Occupational History   Occupation: Event organiser  Tobacco Use   Smoking status: Never   Smokeless tobacco: Never  Substance and Sexual Activity   Alcohol use: Yes    Comment: socialy   Drug use: No   Sexual activity: Not on file  Other Topics Concern   Not on file  Social History Narrative   Lives with spouse   Right handed   Drinks 4+ cups of caffeine daily   Retired    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 investment banker Strain: Not on file  Food Insecurity: No Food Insecurity (06/04/2023)   Hunger Vital Sign    Worried About Running Out of Food in the Last Year: Never true    Ran Out of Food in the Last Year: Never true  Transportation Needs: No Transportation Needs (06/04/2023)   PRAPARE - Administrator, Civil Service (Medical): No    Lack of Transportation (Non-Medical): No  Physical Activity: Not on file  Stress: Not on file  Social Connections: Socially Integrated (06/04/2023)   Social Connection and Isolation Panel [NHANES]    Frequency of Communication with Friends and Family: More than three times a week    Frequency of Social Gatherings with Friends and Family: Twice a week    Attends Religious Services: More than 4 times per year    Active Member of [employer] or Organizations: Yes    Attends Engineer, structural: More than 4 times per year    Marital Status: Married     Family History: The patient's family history includes Cancer in his father and mother; Diabetes in his mother; Heart disease in his mother; Hyperlipidemia in his father; Neuropathy in his paternal uncle. There is  no history of Migraines.  ROS:   Please see the history of present illness.    All other systems reviewed and are negative.  EKGs/Labs/Other Studies Reviewed:    The following studies were reviewed today: Continue EKG Interpretation Date/Time:  Tuesday July 04 2023 14:45:04 EDT Ventricular Rate:  111 PR Interval:    QRS Duration:  86 QT Interval:  334 QTC Calculation: 454 R Axis:   122  Text Interpretation: Atrial fibrillation with rapid ventricular response Left posterior fascicular block Possible Inferior infarct , age undetermined Cannot rule out Anterior infarct , age undetermined When compared with ECG of 04-Jun-2023 10:11, PREVIOUS ECG IS PRESENT Confirmed by Belva Crome [phone]) on 07/04/2023 3:03:03 PM     Recent Labs:  03/14/2023: TSH 5.580 06/06/2023: Magnesium 2.1 06/09/2023: ALT 46 06/14/2023: BUN 20; Creatinine, Ser 0.66; Potassium 4.3; Sodium 134 06/15/2023: Hemoglobin 10.9; Platelets 376  Recent Lipid Panel No results found for: "CHOL", "TRIG", "HDL", "CHOLHDL", "VLDL", "LDLCALC", "LDLDIRECT"  Physical Exam:    VS:  BP (!) 102/52   Pulse (!) 111   Ht 6\' 4"  (1.93 m)   Wt 213 lb (96.6 kg)   SpO2 95%   BMI 25.93 kg/m     Wt Readings from Last 3 Encounters:  07/04/23 213 lb (96.6 kg)  06/07/23 250 lb 4.8 oz (113.5 kg)  04/25/23 250 lb 6.4 oz (113.6 kg)     GEN: Patient is in no acute distress HEENT: Normal NECK: No JVD; No carotid bruits LYMPHATICS: No lymphadenopathy CARDIAC: S1 S2 regular, 2/6 systolic murmur at the apex. RESPIRATORY:  Clear to auscultation without rales, wheezing or rhonchi  ABDOMEN: Soft, non-tender, non-distended MUSCULOSKELETAL:  No edema; No deformity  SKIN: Warm and dry NEUROLOGIC:  Alert and oriented x 3 PSYCHIATRIC:  Normal affect    Signed, Garwin Brothers, MD  07/04/2023 3:15 PM    [facility]

## 2023-07-04 NOTE — Patient Instructions (Addendum)
 Medication Instructions:  Your physician has recommended you make the following change in your medication:   Start Eliquis 5 mg twice daily.  Start Digoxin 0.25 mg daily for 1 week then decrease it to 0.125 mg daily   *If you need a refill on your cardiac medications before your next appointment, please call your pharmacy*   Lab Work: Your physician recommends that you have a BMP, magnesium and TSH today in the office.   Ifob in 1 week If you have labs (blood work) drawn today and your tests are completely normal, you will receive your results only by: MyChart Message (if you have MyChart) OR A paper copy in the mail If you have any lab test that is abnormal or we need to change your treatment, we will call you to review the results.   Testing/Procedures: Your physician has requested that you have an echocardiogram. Echocardiography is a painless test that uses sound waves to create images of your heart. It provides your doctor with information about the size and shape of your heart and how well your heart's chambers and valves are working. This procedure takes approximately one hour. There are no restrictions for this procedure. Please do NOT wear cologne, perfume, aftershave, or lotions (deodorant is allowed). Please arrive 15 minutes prior to your appointment time.     Follow-Up: At [facility], you and your health needs are our priority.  As part of our continuing mission to provide you with exceptional heart care, we have created designated Provider Care Teams.  These Care Teams include your primary Cardiologist (physician) and Advanced Practice Providers (APPs -  Physician Assistants and Nurse Practitioners) who all work together to provide you with the care you need, when you need it.  We recommend signing up for the patient portal called "MyChart".  Sign up information is provided on this After Visit Summary.  MyChart is used to connect with patients for Virtual Visits  (Telemedicine).  Patients are able to view lab/test results, encounter notes, upcoming appointments, etc.  Non-urgent messages can be sent to your provider as well.   To learn more about what you can do with MyChart, go to [email or web address].    Your next appointment:   2-3 week(s)  The format for your next appointment:   In Person  Provider:   Belva Crome, MD   Other Instructions Echocardiogram An echocardiogram is a test that uses sound waves (ultrasound) to produce images of the heart. Images from an echocardiogram can provide important information about: Heart size and shape. The size and thickness and movement of your heart's walls. Heart muscle function and strength. Heart valve function or if you have stenosis. Stenosis is when the heart valves are too narrow. If blood is flowing backward through the heart valves (regurgitation). A tumor or infectious growth around the heart valves. Areas of heart muscle that are not working well because of poor blood flow or injury from a heart attack. Aneurysm detection. An aneurysm is a weak or damaged part of an artery wall. The wall bulges out from the normal force of blood pumping through the body. Tell a health care provider about: Any allergies you have. All medicines you are taking, including vitamins, herbs, eye drops, creams, and over-the-counter medicines. Any blood disorders you have. Any surgeries you have had. Any medical conditions you have. Whether you are pregnant or may be pregnant. What are the risks? Generally, this is a safe test. However, problems may occur, including an allergic reaction  to dye (contrast) that may be used during the test. What happens before the test? No specific preparation is needed. You may eat and drink normally. What happens during the test? You will take off your clothes from the waist up and put on a hospital gown. Electrodes or electrocardiogram (ECG)patches may be placed on your  chest. The electrodes or patches are then connected to a device that monitors your heart rate and rhythm. You will lie down on a table for an ultrasound exam. A gel will be applied to your chest to help sound waves pass through your skin. A handheld device, called a transducer, will be pressed against your chest and moved over your heart. The transducer produces sound waves that travel to your heart and bounce back (or "echo" back) to the transducer. These sound waves will be captured in real-time and changed into images of your heart that can be viewed on a video monitor. The images will be recorded on a computer and reviewed by your health care provider. You may be asked to change positions or hold your breath for a short time. This makes it easier to get different views or better views of your heart. In some cases, you may receive contrast through an IV in one of your veins. This can improve the quality of the pictures from your heart. The procedure may vary among health care providers and hospitals.   What can I expect after the test? You may return to your normal, everyday life, including diet, activities, and medicines, unless your health care provider tells you not to do that. Follow these instructions at home: It is up to you to get the results of your test. Ask your health care provider, or the department that is doing the test, when your results will be ready. Keep all follow-up visits. This is important. Summary An echocardiogram is a test that uses sound waves (ultrasound) to produce images of the heart. Images from an echocardiogram can provide important information about the size and shape of your heart, heart muscle function, heart valve function, and other possible heart problems. You do not need to do anything to prepare before this test. You may eat and drink normally. After the echocardiogram is completed, you may return to your normal, everyday life, unless your health care  provider tells you not to do that. This information is not intended to replace advice given to you by your health care provider. Make sure you discuss any questions you have with your health care provider. Document Revised: 11/12/2019 Document Reviewed: 11/12/2019 Elsevier Patient Education  2021 Elsevier Inc.   Important Information About Sugar

## 2023-07-05 DIAGNOSIS — J690 Pneumonitis due to inhalation of food and vomit: Secondary | ICD-10-CM | POA: Diagnosis not present

## 2023-07-05 DIAGNOSIS — D649 Anemia, unspecified: Secondary | ICD-10-CM | POA: Diagnosis not present

## 2023-07-05 LAB — BASIC METABOLIC PANEL WITH GFR
BUN/Creatinine Ratio: 14 (ref 10–24)
BUN: 10 mg/dL (ref 8–27)
CO2: 22 mmol/L (ref 20–29)
Calcium: 9.1 mg/dL (ref 8.6–10.2)
Chloride: 99 mmol/L (ref 96–106)
Creatinine, Ser: 0.7 mg/dL — ABNORMAL LOW (ref 0.76–1.27)
Glucose: 108 mg/dL — ABNORMAL HIGH (ref 70–99)
Potassium: 4.2 mmol/L (ref 3.5–5.2)
Sodium: 137 mmol/L (ref 134–144)
eGFR: 98 mL/min/{1.73_m2} (ref >59)

## 2023-07-05 LAB — LAB REPORT - SCANNED: EGFR: 95

## 2023-07-05 LAB — TSH: TSH: 4.56 u[IU]/mL — ABNORMAL HIGH (ref 0.450–4.500)

## 2023-07-05 LAB — MAGNESIUM: Magnesium: 1.6 mg/dL (ref 1.6–2.3)

## 2023-07-06 ENCOUNTER — Encounter: Payer: Self-pay | Admitting: Medical Oncology

## 2023-07-06 DIAGNOSIS — G4733 Obstructive sleep apnea (adult) (pediatric): Secondary | ICD-10-CM | POA: Diagnosis not present

## 2023-07-06 DIAGNOSIS — J690 Pneumonitis due to inhalation of food and vomit: Secondary | ICD-10-CM | POA: Diagnosis not present

## 2023-07-06 DIAGNOSIS — M109 Gout, unspecified: Secondary | ICD-10-CM | POA: Diagnosis not present

## 2023-07-06 DIAGNOSIS — F32A Depression, unspecified: Secondary | ICD-10-CM | POA: Diagnosis not present

## 2023-07-06 DIAGNOSIS — E785 Hyperlipidemia, unspecified: Secondary | ICD-10-CM | POA: Diagnosis not present

## 2023-07-06 DIAGNOSIS — K760 Fatty (change of) liver, not elsewhere classified: Secondary | ICD-10-CM | POA: Diagnosis not present

## 2023-07-06 DIAGNOSIS — K219 Gastro-esophageal reflux disease without esophagitis: Secondary | ICD-10-CM | POA: Diagnosis not present

## 2023-07-06 DIAGNOSIS — Z85828 Personal history of other malignant neoplasm of skin: Secondary | ICD-10-CM | POA: Diagnosis not present

## 2023-07-06 DIAGNOSIS — M899 Disorder of bone, unspecified: Secondary | ICD-10-CM | POA: Diagnosis not present

## 2023-07-06 DIAGNOSIS — F419 Anxiety disorder, unspecified: Secondary | ICD-10-CM | POA: Diagnosis not present

## 2023-07-06 DIAGNOSIS — G603 Idiopathic progressive neuropathy: Secondary | ICD-10-CM | POA: Diagnosis not present

## 2023-07-06 DIAGNOSIS — J101 Influenza due to other identified influenza virus with other respiratory manifestations: Secondary | ICD-10-CM | POA: Diagnosis not present

## 2023-07-06 DIAGNOSIS — I10 Essential (primary) hypertension: Secondary | ICD-10-CM | POA: Diagnosis not present

## 2023-07-06 NOTE — Progress Notes (Signed)
 [facility]  Patient's spouse, Trevin Gartrell, called to schedule appointment for Mr. Tompson. I spoke with Mrs. Rann originally on March 17th and she had informed me at the time that he was at [facility]. She informed me that she would call to schedule him when he is discharged from [facility]. Spouse called this morning and appointment scheduled for [facility] on April 10th, per spouses requested availability date,  at 12:30 with Jae Dire, Cordelia Poche. Spouse thanked and encouraged to call with questions in the meantime.   Gregary Cromer, RN, BSN, [facility] Oncology Nurse Navigator, [facility] 07/06/2023 10:32 AM

## 2023-07-07 DIAGNOSIS — Z85828 Personal history of other malignant neoplasm of skin: Secondary | ICD-10-CM | POA: Diagnosis not present

## 2023-07-07 DIAGNOSIS — F419 Anxiety disorder, unspecified: Secondary | ICD-10-CM | POA: Diagnosis not present

## 2023-07-07 DIAGNOSIS — G603 Idiopathic progressive neuropathy: Secondary | ICD-10-CM | POA: Diagnosis not present

## 2023-07-07 DIAGNOSIS — E785 Hyperlipidemia, unspecified: Secondary | ICD-10-CM | POA: Diagnosis not present

## 2023-07-07 DIAGNOSIS — K760 Fatty (change of) liver, not elsewhere classified: Secondary | ICD-10-CM | POA: Diagnosis not present

## 2023-07-07 DIAGNOSIS — J690 Pneumonitis due to inhalation of food and vomit: Secondary | ICD-10-CM | POA: Diagnosis not present

## 2023-07-07 DIAGNOSIS — I10 Essential (primary) hypertension: Secondary | ICD-10-CM | POA: Diagnosis not present

## 2023-07-07 DIAGNOSIS — J101 Influenza due to other identified influenza virus with other respiratory manifestations: Secondary | ICD-10-CM | POA: Diagnosis not present

## 2023-07-07 DIAGNOSIS — F32A Depression, unspecified: Secondary | ICD-10-CM | POA: Diagnosis not present

## 2023-07-07 DIAGNOSIS — M899 Disorder of bone, unspecified: Secondary | ICD-10-CM | POA: Diagnosis not present

## 2023-07-07 DIAGNOSIS — K219 Gastro-esophageal reflux disease without esophagitis: Secondary | ICD-10-CM | POA: Diagnosis not present

## 2023-07-07 DIAGNOSIS — M109 Gout, unspecified: Secondary | ICD-10-CM | POA: Diagnosis not present

## 2023-07-07 DIAGNOSIS — G4733 Obstructive sleep apnea (adult) (pediatric): Secondary | ICD-10-CM | POA: Diagnosis not present

## 2023-07-12 DIAGNOSIS — I10 Essential (primary) hypertension: Secondary | ICD-10-CM | POA: Diagnosis not present

## 2023-07-12 DIAGNOSIS — G4733 Obstructive sleep apnea (adult) (pediatric): Secondary | ICD-10-CM | POA: Diagnosis not present

## 2023-07-12 DIAGNOSIS — M109 Gout, unspecified: Secondary | ICD-10-CM | POA: Diagnosis not present

## 2023-07-12 DIAGNOSIS — K760 Fatty (change of) liver, not elsewhere classified: Secondary | ICD-10-CM | POA: Diagnosis not present

## 2023-07-12 DIAGNOSIS — F32A Depression, unspecified: Secondary | ICD-10-CM | POA: Diagnosis not present

## 2023-07-12 DIAGNOSIS — G603 Idiopathic progressive neuropathy: Secondary | ICD-10-CM | POA: Diagnosis not present

## 2023-07-12 DIAGNOSIS — E785 Hyperlipidemia, unspecified: Secondary | ICD-10-CM | POA: Diagnosis not present

## 2023-07-12 DIAGNOSIS — J101 Influenza due to other identified influenza virus with other respiratory manifestations: Secondary | ICD-10-CM | POA: Diagnosis not present

## 2023-07-12 DIAGNOSIS — J690 Pneumonitis due to inhalation of food and vomit: Secondary | ICD-10-CM | POA: Diagnosis not present

## 2023-07-12 DIAGNOSIS — K219 Gastro-esophageal reflux disease without esophagitis: Secondary | ICD-10-CM | POA: Diagnosis not present

## 2023-07-12 DIAGNOSIS — Z85828 Personal history of other malignant neoplasm of skin: Secondary | ICD-10-CM | POA: Diagnosis not present

## 2023-07-12 DIAGNOSIS — F419 Anxiety disorder, unspecified: Secondary | ICD-10-CM | POA: Diagnosis not present

## 2023-07-12 DIAGNOSIS — M899 Disorder of bone, unspecified: Secondary | ICD-10-CM | POA: Diagnosis not present

## 2023-07-13 ENCOUNTER — Inpatient Hospital Stay

## 2023-07-13 ENCOUNTER — Encounter: Payer: Self-pay | Admitting: Medical Oncology

## 2023-07-13 ENCOUNTER — Inpatient Hospital Stay: Attending: Physician Assistant | Admitting: Physician Assistant

## 2023-07-13 VITALS — BP 129/67 | HR 96 | Temp 98.0°F | Resp 17 | Wt 215.9 lb

## 2023-07-13 DIAGNOSIS — F32A Depression, unspecified: Secondary | ICD-10-CM | POA: Diagnosis not present

## 2023-07-13 DIAGNOSIS — M109 Gout, unspecified: Secondary | ICD-10-CM | POA: Diagnosis not present

## 2023-07-13 DIAGNOSIS — M898X9 Other specified disorders of bone, unspecified site: Secondary | ICD-10-CM

## 2023-07-13 DIAGNOSIS — K760 Fatty (change of) liver, not elsewhere classified: Secondary | ICD-10-CM | POA: Diagnosis not present

## 2023-07-13 DIAGNOSIS — D649 Anemia, unspecified: Secondary | ICD-10-CM | POA: Diagnosis not present

## 2023-07-13 DIAGNOSIS — E785 Hyperlipidemia, unspecified: Secondary | ICD-10-CM | POA: Diagnosis not present

## 2023-07-13 DIAGNOSIS — M899 Disorder of bone, unspecified: Secondary | ICD-10-CM

## 2023-07-13 DIAGNOSIS — K219 Gastro-esophageal reflux disease without esophagitis: Secondary | ICD-10-CM | POA: Diagnosis not present

## 2023-07-13 DIAGNOSIS — F419 Anxiety disorder, unspecified: Secondary | ICD-10-CM | POA: Diagnosis not present

## 2023-07-13 DIAGNOSIS — Z85828 Personal history of other malignant neoplasm of skin: Secondary | ICD-10-CM | POA: Diagnosis not present

## 2023-07-13 DIAGNOSIS — I10 Essential (primary) hypertension: Secondary | ICD-10-CM | POA: Diagnosis not present

## 2023-07-13 DIAGNOSIS — J101 Influenza due to other identified influenza virus with other respiratory manifestations: Secondary | ICD-10-CM | POA: Diagnosis not present

## 2023-07-13 DIAGNOSIS — J690 Pneumonitis due to inhalation of food and vomit: Secondary | ICD-10-CM | POA: Diagnosis not present

## 2023-07-13 DIAGNOSIS — G4733 Obstructive sleep apnea (adult) (pediatric): Secondary | ICD-10-CM | POA: Diagnosis not present

## 2023-07-13 DIAGNOSIS — Z79899 Other long term (current) drug therapy: Secondary | ICD-10-CM | POA: Insufficient documentation

## 2023-07-13 DIAGNOSIS — G603 Idiopathic progressive neuropathy: Secondary | ICD-10-CM | POA: Diagnosis not present

## 2023-07-13 LAB — CBC WITH DIFFERENTIAL (CANCER CENTER ONLY)
Abs Immature Granulocytes: 0.01 10*3/uL (ref 0.00–0.07)
Basophils Absolute: 0 10*3/uL (ref 0.0–0.1)
Basophils Relative: 1 %
Eosinophils Absolute: 0.1 10*3/uL (ref 0.0–0.5)
Eosinophils Relative: 1 %
HCT: 35.7 % — ABNORMAL LOW (ref 39.0–52.0)
Hemoglobin: 12.2 g/dL — ABNORMAL LOW (ref 13.0–17.0)
Immature Granulocytes: 0 %
Lymphocytes Relative: 13 %
Lymphs Abs: 0.7 10*3/uL (ref 0.7–4.0)
MCH: 31.5 pg (ref 26.0–34.0)
MCHC: 34.2 g/dL (ref 30.0–36.0)
MCV: 92.2 fL (ref 80.0–100.0)
Monocytes Absolute: 0.5 10*3/uL (ref 0.1–1.0)
Monocytes Relative: 9 %
Neutro Abs: 3.9 10*3/uL (ref 1.7–7.7)
Neutrophils Relative %: 76 %
Platelet Count: 334 10*3/uL (ref 150–400)
RBC: 3.87 MIL/uL — ABNORMAL LOW (ref 4.22–5.81)
RDW: 14.4 % (ref 11.5–15.5)
WBC Count: 5.1 10*3/uL (ref 4.0–10.5)
nRBC: 0 % (ref 0.0–0.2)

## 2023-07-13 LAB — CMP (CANCER CENTER ONLY)
ALT: 30 U/L (ref 0–44)
AST: 37 U/L (ref 15–41)
Albumin: 3.7 g/dL (ref 3.5–5.0)
Alkaline Phosphatase: 75 U/L (ref 38–126)
Anion gap: 5 (ref 5–15)
BUN: 7 mg/dL — ABNORMAL LOW (ref 8–23)
CO2: 29 mmol/L (ref 22–32)
Calcium: 9.3 mg/dL (ref 8.9–10.3)
Chloride: 104 mmol/L (ref 98–111)
Creatinine: 0.76 mg/dL (ref 0.61–1.24)
GFR, Estimated: >60 mL/min (ref >60)
Glucose, Bld: 101 mg/dL — ABNORMAL HIGH (ref 70–99)
Potassium: 3.9 mmol/L (ref 3.5–5.1)
Sodium: 138 mmol/L (ref 135–145)
Total Bilirubin: 0.5 mg/dL (ref 0.0–1.2)
Total Protein: 6.7 g/dL (ref 6.5–8.1)

## 2023-07-13 LAB — FOLATE: Folate: 18.3 ng/mL (ref >5.9)

## 2023-07-13 LAB — LACTATE DEHYDROGENASE: LDH: 192 U/L (ref 98–192)

## 2023-07-13 LAB — IRON AND IRON BINDING CAPACITY (CC-WL,HP ONLY)
Iron: 70 ug/dL (ref 45–182)
Saturation Ratios: 29 % (ref 17.9–39.5)
TIBC: 241 ug/dL — ABNORMAL LOW (ref 250–450)
UIBC: 171 ug/dL (ref 117–376)

## 2023-07-13 LAB — VITAMIN B12: Vitamin B-12: 992 pg/mL — ABNORMAL HIGH (ref 180–914)

## 2023-07-13 NOTE — Progress Notes (Signed)
 [facility]   Patient presented to clinic, with his spouse, for his scheduled appointment with PA-C Daphane Shepherd. I introduced myself and provided them with my direct contact information. Patient and spouse were both encouraged to call me with any questions/concerns they may have.  Gregary Cromer, RN, BSN, [facility] Oncology Nurse Navigator, [facility] 07/13/2023 2:30 PM

## 2023-07-13 NOTE — Progress Notes (Signed)
 [facility] [phone]   [phone]  INITIAL CONSULTATION:  Patient Care Team: Windell Hasty, DO as PCP - General (Internal Medicine)  CHIEF COMPLAINTS/PURPOSE OF CONSULTATION:  "Lytic lesions "  HISTORY OF PRESENTING ILLNESS:  David Mcgrath 73 y.o. male with medical history significant for gout, newly diagnosed paroxysmal atrial fibrillation, chronic idiopathic neuropathy, hypertension, hyperlipidemia, GERD, anxiety and depression, basal cell carcinoma.   On review of the previous records Patient being referred by hospitalist after incidental finding of lytic lesions on CT CAP during admission for acute hypoxic respiratory failure in the setting of Influenza A and CAP vs aspiration pneumonia. Patient had CTA chest and CT CAP on 06/04/23 and 06/08/23. Imaging shows scattered bony lytic lesions in L 4th rib, pelvis including right ilium and thoracic spine. Pertinent labs collected during admission includes protein electrophoresis did not observe an M spike. UPEP 24 hour urine showed elevated free kappa light chains (611.43) and free lambda light chains (78.28) with a normal free kappa/lambda ratio. Also, most recent BMP on 07/04/23 shows normal renal function and electrolytes. Patient was discharged to a rehab facility.  On exam today patient is accompanied by spouse who provides additional history. He experiences no bone pain, including in the ribs, back, or hips. He has no new or different fatigue, night sweats, or unintentional weight loss. He bruises easily and has been on a blood thinner for atrial fibrillation since hospital discharge. He followed up with cardiology outpatient recently and is scheduled for an echo on 07/27/23. There is no bleeding from brushing teeth, stool, or urine. He has a history of anemia noted in his chart over the last couple of months. He recalls being told he was anemic years ago but has not had  a recent workup. He experiences no significant symptoms related to anemia. Spouse adds that patient has a history of basal cell carcinoma, with a lesion removed from his face last year. He mentions a spot on his nose that has not healed and continues to bleed.  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 He also smoked socially for approximately 10 years and reports quitting x 25 years ago. Patient admits to being a is a social drinker although has not been drinking lately. Pertinent family history includes his father passed away from colon cancer. And his mother was found to have "a blood cancer during her autopsy although type is unknown. Patient's last colonoscopy was in 2024 and was normal. His PSA was checked at his physical last year and he also recalls that being normal.   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  Patient reports he does not have symptoms as of 02/25/14.     Diarrhea 06/04/2023   Essential hypertension 06/04/2023   Fatty liver 06/04/2023   Flu 06/04/2023   Hyperlipidemia 06/04/2023   Ileus (HCC) 06/04/2023   Neuropathy    Pneumonia 06/04/2023    SURGICAL HISTORY: Past Surgical History:  Procedure Laterality Date   NO PAST SURGERIES      SOCIAL HISTORY: Social History   Socioeconomic History   Marital status: Married    Spouse name: Nellie Banas   Number of children: 0   Years of education: Not on file   Highest  education level: Not on file  Occupational History   Occupation: appraiser  Tobacco Use   Smoking status: Never   Smokeless tobacco: Never  Substance and Sexual Activity   Alcohol use: Yes    Comment: socialy   Drug use: No   Sexual activity: Not on file  Other Topics Concern   Not on file  Social History  Narrative   Lives with spouse   Right handed   Drinks 4+ cups of caffeine daily   Retired    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Corporate investment banker Strain: Not on file  Food Insecurity: No Food Insecurity (06/04/2023)   Hunger Vital Sign    Worried About Running Out of Food in the Last Year: Never true    Ran Out of Food in the Last Year: Never true  Transportation Needs: No Transportation Needs (06/04/2023)   PRAPARE - Administrator, Civil Service (Medical): No    Lack of Transportation (Non-Medical): No  Physical Activity: Not on file  Stress: Not on file  Social Connections: Socially Integrated (06/04/2023)   Social Connection and Isolation Panel [NHANES]    Frequency of Communication with Friends and Family: More than three times a week    Frequency of Social Gatherings with Friends and Family: Twice a week    Attends Religious Services: More than 4 times per year    Active Member of [employer] or Organizations: Yes    Attends Engineer, structural: More than 4 times per year    Marital Status: Married  Catering manager Violence: Not At Risk (06/04/2023)   Humiliation, Afraid, Rape, and Kick questionnaire    Fear of Current or Ex-Partner: No    Emotionally Abused: No    Physically Abused: No    Sexually Abused: No    FAMILY HISTORY: Family History  Problem Relation Age of Onset   Diabetes Mother    Cancer Mother    Heart disease Mother    Cancer Father    Hyperlipidemia Father    Neuropathy Paternal Uncle    Migraines Neg Hx     ALLERGIES:  is allergic to shrimp [shellfish allergy].  MEDICATIONS:  Current Outpatient Medications  Medication Sig Dispense Refill   allopurinol (ZYLOPRIM) 300 MG tablet Take 300 mg by mouth daily.     apixaban (ELIQUIS) 5 MG TABS tablet Take 1 tablet (5 mg total) by mouth 2 (two) times daily.     apixaban (ELIQUIS) 5 MG TABS tablet Take 1 tablet (5 mg total) by mouth 2 (two) times daily. 60 tablet 12   Cholecalciferol (VITAMIN D-3)  125 MCG (5000 UT) TABS Take 5,000 Units by mouth daily.     Cyanocobalamin (VITAMIN B12) 1000 MCG TBCR Take 1,000 mcg by mouth daily.     digoxin (LANOXIN) 0.125 MG tablet Digoxin 0.25 mg(2 tablets) daily for 1 week then decrease it to 0.125 mg (1 tablet) daily 97 tablet 3   ezetimibe (ZETIA) 10 MG tablet Take 10 mg by mouth every evening.     FLUoxetine (PROZAC) 20 MG tablet Take 20 mg by mouth daily.     gabapentin (NEURONTIN) 300 MG capsule Take 2 capsules (600 mg total) by mouth 2 (two) times daily.     melatonin 5 MG TABS Take 5 mg by mouth at bedtime.     olmesartan (BENICAR) 20 MG tablet Take 10 mg by mouth daily. (Patient not taking: Reported on 07/04/2023)     pravastatin (PRAVACHOL) 80 MG tablet Take  80 mg by mouth at bedtime.     pyridoxine (B-6) 100 MG tablet Take 100 mg by mouth daily.     No current facility-administered medications for this visit.    REVIEW OF SYSTEMS:   All other systems are reviewed and are negative for acute change except as noted in the HPI.  PHYSICAL EXAMINATION: ECOG PERFORMANCE STATUS: 1 - Symptomatic but completely ambulatory  Vitals:   07/13/23 1231  BP: 129/67  Pulse: 96  Resp: 17  Temp: 98 F (36.7 C)  SpO2: 95%   Filed Weights   07/13/23 1231  Weight: 215 lb 14.4 oz (97.9 kg)    Physical Exam Vitals reviewed.  Constitutional:      Appearance: He is not ill-appearing or toxic-appearing.  HENT:     Head: Normocephalic.     Right Ear: External ear normal.     Left Ear: External ear normal.     Mouth/Throat:     Mouth: Mucous membranes are moist.     Pharynx: Oropharynx is clear.  Eyes:     General: No scleral icterus.    Conjunctiva/sclera: Conjunctivae normal.  Cardiovascular:     Rate and Rhythm: Normal rate and regular rhythm.     Pulses: Normal pulses.     Heart sounds: Murmur heard.  Pulmonary:     Effort: Pulmonary effort is normal.     Breath sounds: Normal breath sounds.  Abdominal:     General: Bowel sounds are  normal. There is no distension.     Tenderness: There is no abdominal tenderness.  Musculoskeletal:        General: Normal range of motion.     Cervical back: Normal range of motion.     Comments: No cervical, thoracic, or lumbar spinal tenderness to palpation.  No paraspinal tenderness. No step offs, crepitus or deformity palpated.  Skin:    General: Skin is warm and dry.     Capillary Refill: Capillary refill takes less than 2 seconds.  Neurological:     Comments: Sensation grossly intact to light touch in the lower extremities bilaterally. No saddle anesthesias. Strength 5/5 with flexion and extension at the bilateral hips, knees, and ankles. No noted gait deficit.        LABORATORY DATA:  I have reviewed the data as listed    Latest Ref Rng & Units 07/13/2023    1:50 PM 06/15/2023    5:49 AM 06/13/2023   11:49 AM  CBC  WBC 4.0 - 10.5 K/uL 5.1  11.8  15.6   Hemoglobin 13.0 - 17.0 g/dL 86.5  78.4  69.6   Hematocrit 39.0 - 52.0 % 35.7  33.2  31.9   Platelets 150 - 400 K/uL 334  376  350        Latest Ref Rng & Units 07/13/2023    1:50 PM 07/04/2023    3:46 PM 06/14/2023    5:36 AM  CMP  Glucose 70 - 99 mg/dL 295  284  132   BUN 8 - 23 mg/dL 7  10  20    Creatinine 0.61 - 1.24 mg/dL 4.40  1.02  7.25   Sodium 135 - 145 mmol/L 138  137  134   Potassium 3.5 - 5.1 mmol/L 3.9  4.2  4.3   Chloride 98 - 111 mmol/L 104  99  102   CO2 22 - 32 mmol/L 29  22  21    Calcium 8.9 - 10.3 mg/dL 9.3  9.1  9.2  Total Protein 6.5 - 8.1 g/dL 6.7     Total Bilirubin 0.0 - 1.2 mg/dL 0.5     Alkaline Phos 38 - 126 U/L 75     AST 15 - 41 U/L 37     ALT 0 - 44 U/L 30        RADIOGRAPHIC STUDIES: I have personally reviewed the radiological images as listed and agreed with the findings in the report. No results found.  ASSESSMENT & PLAN David Mcgrath is a 73 y.o. male presenting [facility] for consultation regarding lytic lesions. We have reviewed etiologies  including metastatic disease vs lymphoproliferative disorder . Patient will proceed with laboratory workup today.   #Lytic lesions - Reviewed CT CAP with patient and spouse.  - Labs collected today include: CBC, CMP, LDH, multiple myeloma panel, serum free light chains. Will also check B12, folate, and iron panel -Pending results of Multiple Myeloma panel will consider bone biopsy vs PET scan for further evaluation.  #Age related screenings -Colorectal screening UTD. - Due for PSA, will check today.  -Patient will RTC when work up is complete.  Patient expressed understanding of the recommended workup and is agreeable to move forward.   All questions were answered. The patient knows to call the clinic with any problems, questions or concerns.  Shared visit with Dr. Rosaline Coma  Orders Placed This Encounter  Procedures   Vitamin B12    Standing Status:   Future    Number of Occurrences:   1    Expiration Date:   07/12/2024   Folate, Serum    Standing Status:   Future    Number of Occurrences:   1    Expiration Date:   07/12/2024   Prostate-Specific AG, Serum    Standing Status:   Future    Number of Occurrences:   1    Expiration Date:   07/12/2024   Iron and Iron Binding Capacity ([facility] only)    Standing Status:   Future    Number of Occurrences:   1    Expiration Date:   07/12/2024   CBC with Differential (Cancer Center Only)   CMP (Cancer Center only)   Kappa/lambda light chains   Lactate dehydrogenase   Multiple Myeloma Panel (SPEP&IFE w/QIG)      I have spent a total of 60 minutes minutes of face-to-face and non-face-to-face time, preparing to see the patient, obtaining and/or reviewing separately obtained history, performing a medically appropriate examination, counseling and educating the patient, ordering medications/tests/procedures, referring and communicating with other health care professionals, documenting clinical information in the electronic health record,  independently interpreting results and communicating results to the patient, and care coordination.   Maddon Horton Walisiewicz PA-C Department of Hematology/Oncology [facility] at [facility] Phone: [phone]  I have read the above note and personally examined the patient. I agree with the assessment and plan as noted above.  Briefly Mr. Dantavious Snowball Mcgrath is a 73 year old male who presents for evaluation of lytic lesions of the skeleton.  The patient had been having abdominal pain and dyspnea and underwent a CT chest angiogram on 06/04/2023.  The scan did not show evidence of pulmonary embolism but did show osseous lytic lesions of the fourth left rib and right ilium, possibly L5 vertebra.  Due to concern for these findings the patient was referred to hematology for further evaluation and management.  He underwent a full CT chest abdomen pelvis on 06/08/2023 which  showed no evidence of a solid tumor.  At this time findings are highly suspicious for multiple myeloma.  Will order SPEP and serum free light chains.  The event this is positive we will order UPEP and metastatic survey in addition to a bone marrow biopsy.  In the event that there is no evidence of multiple myeloma we will need to pursue a biopsy of one of the lytic lesions which unfortunately tends to be low yield.  Today we discussed our findings and plan moving forward with the patient.  He voices understanding and was willing and able to proceed.   Rogerio Clay, MD Department of Hematology/Oncology [facility] at  [facility] Phone: [phone] Pager: [phone] Email: [email or web address]

## 2023-07-13 NOTE — Patient Instructions (Signed)
 Diagnostic Clinic Office Visit Discharge Information and Instructions  Thank you for choosing [facility] for your healthcare needs.  Below is a summary of today's discussion, along with our contact information and an outline of what to expect next.  Reason for Visit:  bone lesions  Proposed Diagnostic Care Plan: Labs collected today    What to Expect: - Generally, when lab tests are ordered the results can take up to 1 week for results to be available.  At that point, we will contact you to discuss your results with you.  Unless there is a critical result, we will typically wait for all of your lab results to be available before contacting you. - If a biopsy is part of your Care Plan, those results can take on average 7-10 days to result.  Once results are available, we will contact you to discuss your pathology results and any next steps. - If you have additional imaging ordered, such as a CT Scan, MRI, Ultrasound, Bone Scan, or PET scan, your imaging will need to be authorized then scheduled with the earliest available appointment.  You may be asked to travel to another hospital within [facility] who has a sooner availability, please consider doing so if asked. - If you use MyChart, your results will be available to you in the MyChart portal.  Your provider will be in touch with you as soon as all of your results are available to be discussed.  Your Diagnostic Clinic Provider:  Namon Cirri PA-C and Dr. Leonides Schanz Your Diagnostic Navigator:  Chauncy Lean RN, office number [phone]  If you or your caregiver have number blocking on your cell phones, please ensure the cancer center's numbers are not blocked.  If you are not a registered MyChart user, please consider enrolling in MyChart to receive your test results and visit notes.  You can also access your discharge instructions electronically.  MyChart also gives you an electronic means to communicate with your Care Team instead  of needing to call in to the cancer center.  We appreciate you trusting [place] with your healthcare and look forward to partnering with you as we work to uncover what your potential diagnosis may be.  Please do not hesitate to reach out at any point with questions or concerns.

## 2023-07-14 DIAGNOSIS — I10 Essential (primary) hypertension: Secondary | ICD-10-CM | POA: Diagnosis not present

## 2023-07-14 DIAGNOSIS — J101 Influenza due to other identified influenza virus with other respiratory manifestations: Secondary | ICD-10-CM | POA: Diagnosis not present

## 2023-07-14 DIAGNOSIS — J690 Pneumonitis due to inhalation of food and vomit: Secondary | ICD-10-CM | POA: Diagnosis not present

## 2023-07-14 DIAGNOSIS — Z85828 Personal history of other malignant neoplasm of skin: Secondary | ICD-10-CM | POA: Diagnosis not present

## 2023-07-14 DIAGNOSIS — E785 Hyperlipidemia, unspecified: Secondary | ICD-10-CM | POA: Diagnosis not present

## 2023-07-14 DIAGNOSIS — G603 Idiopathic progressive neuropathy: Secondary | ICD-10-CM | POA: Diagnosis not present

## 2023-07-14 DIAGNOSIS — M899 Disorder of bone, unspecified: Secondary | ICD-10-CM | POA: Diagnosis not present

## 2023-07-14 DIAGNOSIS — K219 Gastro-esophageal reflux disease without esophagitis: Secondary | ICD-10-CM | POA: Diagnosis not present

## 2023-07-14 DIAGNOSIS — K760 Fatty (change of) liver, not elsewhere classified: Secondary | ICD-10-CM | POA: Diagnosis not present

## 2023-07-14 DIAGNOSIS — G4733 Obstructive sleep apnea (adult) (pediatric): Secondary | ICD-10-CM | POA: Diagnosis not present

## 2023-07-14 DIAGNOSIS — F32A Depression, unspecified: Secondary | ICD-10-CM | POA: Diagnosis not present

## 2023-07-14 DIAGNOSIS — M109 Gout, unspecified: Secondary | ICD-10-CM | POA: Diagnosis not present

## 2023-07-14 DIAGNOSIS — F419 Anxiety disorder, unspecified: Secondary | ICD-10-CM | POA: Diagnosis not present

## 2023-07-14 LAB — KAPPA/LAMBDA LIGHT CHAINS
Kappa free light chain: 512.1 mg/L — ABNORMAL HIGH (ref 3.3–19.4)
Kappa, lambda light chain ratio: 25.23 — ABNORMAL HIGH (ref 0.26–1.65)
Lambda free light chains: 20.3 mg/L (ref 5.7–26.3)

## 2023-07-14 LAB — PROSTATE-SPECIFIC AG, SERUM (LABCORP): Prostate Specific Ag, Serum: 0.4 ng/mL (ref 0.0–4.0)

## 2023-07-16 LAB — MULTIPLE MYELOMA PANEL, SERUM
Albumin SerPl Elph-Mcnc: 3.1 g/dL (ref 2.9–4.4)
Albumin/Glob SerPl: 1.2 (ref 0.7–1.7)
Alpha 1: 0.3 g/dL (ref 0.0–0.4)
Alpha2 Glob SerPl Elph-Mcnc: 1 g/dL (ref 0.4–1.0)
B-Globulin SerPl Elph-Mcnc: 0.7 g/dL (ref 0.7–1.3)
Gamma Glob SerPl Elph-Mcnc: 0.7 g/dL (ref 0.4–1.8)
Globulin, Total: 2.8 g/dL (ref 2.2–3.9)
IgA: 89 mg/dL (ref 61–437)
IgG (Immunoglobin G), Serum: 821 mg/dL (ref 603–1613)
IgM (Immunoglobulin M), Srm: 80 mg/dL (ref 15–143)
M Protein SerPl Elph-Mcnc: 0.3 g/dL — ABNORMAL HIGH
Total Protein ELP: 5.9 g/dL — ABNORMAL LOW (ref 6.0–8.5)

## 2023-07-17 ENCOUNTER — Encounter: Payer: Self-pay | Admitting: Medical Oncology

## 2023-07-17 ENCOUNTER — Telehealth: Payer: Self-pay | Admitting: Physician Assistant

## 2023-07-17 DIAGNOSIS — G4733 Obstructive sleep apnea (adult) (pediatric): Secondary | ICD-10-CM | POA: Diagnosis not present

## 2023-07-17 DIAGNOSIS — Z85828 Personal history of other malignant neoplasm of skin: Secondary | ICD-10-CM | POA: Diagnosis not present

## 2023-07-17 DIAGNOSIS — K219 Gastro-esophageal reflux disease without esophagitis: Secondary | ICD-10-CM | POA: Diagnosis not present

## 2023-07-17 DIAGNOSIS — G603 Idiopathic progressive neuropathy: Secondary | ICD-10-CM | POA: Diagnosis not present

## 2023-07-17 DIAGNOSIS — J690 Pneumonitis due to inhalation of food and vomit: Secondary | ICD-10-CM | POA: Diagnosis not present

## 2023-07-17 DIAGNOSIS — I10 Essential (primary) hypertension: Secondary | ICD-10-CM | POA: Diagnosis not present

## 2023-07-17 DIAGNOSIS — J101 Influenza due to other identified influenza virus with other respiratory manifestations: Secondary | ICD-10-CM | POA: Diagnosis not present

## 2023-07-17 DIAGNOSIS — M899 Disorder of bone, unspecified: Secondary | ICD-10-CM | POA: Diagnosis not present

## 2023-07-17 DIAGNOSIS — F419 Anxiety disorder, unspecified: Secondary | ICD-10-CM | POA: Diagnosis not present

## 2023-07-17 DIAGNOSIS — E785 Hyperlipidemia, unspecified: Secondary | ICD-10-CM | POA: Diagnosis not present

## 2023-07-17 DIAGNOSIS — M898X9 Other specified disorders of bone, unspecified site: Secondary | ICD-10-CM

## 2023-07-17 DIAGNOSIS — K760 Fatty (change of) liver, not elsewhere classified: Secondary | ICD-10-CM | POA: Diagnosis not present

## 2023-07-17 DIAGNOSIS — M109 Gout, unspecified: Secondary | ICD-10-CM | POA: Diagnosis not present

## 2023-07-17 DIAGNOSIS — F32A Depression, unspecified: Secondary | ICD-10-CM | POA: Diagnosis not present

## 2023-07-17 NOTE — Telephone Encounter (Signed)
 I notified David Mcgrath and spouse by phone regarding lab results.  Lab work is showing an elevated kappa lambda light chain ratio as well as monoclonal protein in the myeloma panel.  Patient will need a bone survey and bone marrow biopsy to further workup concern for multiple myeloma. Patient scheduled for bone marrow biopsy on 07/31/23 and spouse is working on scheduling bone survey.  Patient is scheduled to follow up with Delores Fester T. PA-C on 05/06 to discuss results.  All of patient's questions were answered and they expressed understanding of the plan provided.

## 2023-07-19 DIAGNOSIS — M109 Gout, unspecified: Secondary | ICD-10-CM | POA: Diagnosis not present

## 2023-07-19 DIAGNOSIS — J101 Influenza due to other identified influenza virus with other respiratory manifestations: Secondary | ICD-10-CM | POA: Diagnosis not present

## 2023-07-19 DIAGNOSIS — I10 Essential (primary) hypertension: Secondary | ICD-10-CM | POA: Diagnosis not present

## 2023-07-19 DIAGNOSIS — F419 Anxiety disorder, unspecified: Secondary | ICD-10-CM | POA: Diagnosis not present

## 2023-07-19 DIAGNOSIS — K219 Gastro-esophageal reflux disease without esophagitis: Secondary | ICD-10-CM | POA: Diagnosis not present

## 2023-07-19 DIAGNOSIS — K760 Fatty (change of) liver, not elsewhere classified: Secondary | ICD-10-CM | POA: Diagnosis not present

## 2023-07-19 DIAGNOSIS — G603 Idiopathic progressive neuropathy: Secondary | ICD-10-CM | POA: Diagnosis not present

## 2023-07-19 DIAGNOSIS — J690 Pneumonitis due to inhalation of food and vomit: Secondary | ICD-10-CM | POA: Diagnosis not present

## 2023-07-19 DIAGNOSIS — F32A Depression, unspecified: Secondary | ICD-10-CM | POA: Diagnosis not present

## 2023-07-19 DIAGNOSIS — M899 Disorder of bone, unspecified: Secondary | ICD-10-CM | POA: Diagnosis not present

## 2023-07-19 DIAGNOSIS — Z85828 Personal history of other malignant neoplasm of skin: Secondary | ICD-10-CM | POA: Diagnosis not present

## 2023-07-19 DIAGNOSIS — E785 Hyperlipidemia, unspecified: Secondary | ICD-10-CM | POA: Diagnosis not present

## 2023-07-19 DIAGNOSIS — G4733 Obstructive sleep apnea (adult) (pediatric): Secondary | ICD-10-CM | POA: Diagnosis not present

## 2023-07-20 ENCOUNTER — Telehealth: Payer: Self-pay | Admitting: Physician Assistant

## 2023-07-20 ENCOUNTER — Ambulatory Visit (HOSPITAL_COMMUNITY)
Admission: RE | Admit: 2023-07-20 | Discharge: 2023-07-20 | Disposition: A | Discharge status: Home / Self Care | Source: Ambulatory Visit | Attending: Physician Assistant | Admitting: Physician Assistant

## 2023-07-20 DIAGNOSIS — C9000 Multiple myeloma not having achieved remission: Secondary | ICD-10-CM | POA: Diagnosis not present

## 2023-07-20 DIAGNOSIS — F32A Depression, unspecified: Secondary | ICD-10-CM | POA: Diagnosis not present

## 2023-07-20 DIAGNOSIS — J690 Pneumonitis due to inhalation of food and vomit: Secondary | ICD-10-CM | POA: Diagnosis not present

## 2023-07-20 DIAGNOSIS — M109 Gout, unspecified: Secondary | ICD-10-CM | POA: Diagnosis not present

## 2023-07-20 DIAGNOSIS — G603 Idiopathic progressive neuropathy: Secondary | ICD-10-CM | POA: Diagnosis not present

## 2023-07-20 DIAGNOSIS — M898X9 Other specified disorders of bone, unspecified site: Secondary | ICD-10-CM

## 2023-07-20 DIAGNOSIS — G4733 Obstructive sleep apnea (adult) (pediatric): Secondary | ICD-10-CM | POA: Diagnosis not present

## 2023-07-20 DIAGNOSIS — K219 Gastro-esophageal reflux disease without esophagitis: Secondary | ICD-10-CM | POA: Diagnosis not present

## 2023-07-20 DIAGNOSIS — K760 Fatty (change of) liver, not elsewhere classified: Secondary | ICD-10-CM | POA: Diagnosis not present

## 2023-07-20 DIAGNOSIS — M47812 Spondylosis without myelopathy or radiculopathy, cervical region: Secondary | ICD-10-CM | POA: Diagnosis not present

## 2023-07-20 DIAGNOSIS — Z85828 Personal history of other malignant neoplasm of skin: Secondary | ICD-10-CM | POA: Diagnosis not present

## 2023-07-20 DIAGNOSIS — I10 Essential (primary) hypertension: Secondary | ICD-10-CM | POA: Diagnosis not present

## 2023-07-20 DIAGNOSIS — E785 Hyperlipidemia, unspecified: Secondary | ICD-10-CM | POA: Diagnosis not present

## 2023-07-20 DIAGNOSIS — M899 Disorder of bone, unspecified: Secondary | ICD-10-CM | POA: Diagnosis not present

## 2023-07-20 DIAGNOSIS — J101 Influenza due to other identified influenza virus with other respiratory manifestations: Secondary | ICD-10-CM | POA: Diagnosis not present

## 2023-07-20 DIAGNOSIS — F419 Anxiety disorder, unspecified: Secondary | ICD-10-CM | POA: Diagnosis not present

## 2023-07-20 NOTE — Telephone Encounter (Signed)
 Called patient/spouse to discuss results of metastatic bone survey without answer. The scan shows no new lucent lesions. The patient is scheduled for a bone marrow biopsy on 07/31/23 and an appointment with Delores Fester T. PA-C on 08/08/23 to discuss biopsy results and treatment recommendations.

## 2023-07-24 DIAGNOSIS — Z85828 Personal history of other malignant neoplasm of skin: Secondary | ICD-10-CM | POA: Diagnosis not present

## 2023-07-24 DIAGNOSIS — G603 Idiopathic progressive neuropathy: Secondary | ICD-10-CM | POA: Diagnosis not present

## 2023-07-24 DIAGNOSIS — F419 Anxiety disorder, unspecified: Secondary | ICD-10-CM | POA: Diagnosis not present

## 2023-07-24 DIAGNOSIS — M899 Disorder of bone, unspecified: Secondary | ICD-10-CM | POA: Diagnosis not present

## 2023-07-24 DIAGNOSIS — K219 Gastro-esophageal reflux disease without esophagitis: Secondary | ICD-10-CM | POA: Diagnosis not present

## 2023-07-24 DIAGNOSIS — G4733 Obstructive sleep apnea (adult) (pediatric): Secondary | ICD-10-CM | POA: Diagnosis not present

## 2023-07-24 DIAGNOSIS — M109 Gout, unspecified: Secondary | ICD-10-CM | POA: Diagnosis not present

## 2023-07-24 DIAGNOSIS — I10 Essential (primary) hypertension: Secondary | ICD-10-CM | POA: Diagnosis not present

## 2023-07-24 DIAGNOSIS — E785 Hyperlipidemia, unspecified: Secondary | ICD-10-CM | POA: Diagnosis not present

## 2023-07-24 DIAGNOSIS — J690 Pneumonitis due to inhalation of food and vomit: Secondary | ICD-10-CM | POA: Diagnosis not present

## 2023-07-24 DIAGNOSIS — J101 Influenza due to other identified influenza virus with other respiratory manifestations: Secondary | ICD-10-CM | POA: Diagnosis not present

## 2023-07-24 DIAGNOSIS — K760 Fatty (change of) liver, not elsewhere classified: Secondary | ICD-10-CM | POA: Diagnosis not present

## 2023-07-24 DIAGNOSIS — F32A Depression, unspecified: Secondary | ICD-10-CM | POA: Diagnosis not present

## 2023-07-26 DIAGNOSIS — M899 Disorder of bone, unspecified: Secondary | ICD-10-CM | POA: Diagnosis not present

## 2023-07-26 DIAGNOSIS — K760 Fatty (change of) liver, not elsewhere classified: Secondary | ICD-10-CM | POA: Diagnosis not present

## 2023-07-26 DIAGNOSIS — J690 Pneumonitis due to inhalation of food and vomit: Secondary | ICD-10-CM | POA: Diagnosis not present

## 2023-07-26 DIAGNOSIS — J101 Influenza due to other identified influenza virus with other respiratory manifestations: Secondary | ICD-10-CM | POA: Diagnosis not present

## 2023-07-26 DIAGNOSIS — E785 Hyperlipidemia, unspecified: Secondary | ICD-10-CM | POA: Diagnosis not present

## 2023-07-26 DIAGNOSIS — G4733 Obstructive sleep apnea (adult) (pediatric): Secondary | ICD-10-CM | POA: Diagnosis not present

## 2023-07-26 DIAGNOSIS — M109 Gout, unspecified: Secondary | ICD-10-CM | POA: Diagnosis not present

## 2023-07-26 DIAGNOSIS — I10 Essential (primary) hypertension: Secondary | ICD-10-CM | POA: Diagnosis not present

## 2023-07-26 DIAGNOSIS — Z85828 Personal history of other malignant neoplasm of skin: Secondary | ICD-10-CM | POA: Diagnosis not present

## 2023-07-26 DIAGNOSIS — K219 Gastro-esophageal reflux disease without esophagitis: Secondary | ICD-10-CM | POA: Diagnosis not present

## 2023-07-26 DIAGNOSIS — F419 Anxiety disorder, unspecified: Secondary | ICD-10-CM | POA: Diagnosis not present

## 2023-07-26 DIAGNOSIS — G603 Idiopathic progressive neuropathy: Secondary | ICD-10-CM | POA: Diagnosis not present

## 2023-07-26 DIAGNOSIS — F32A Depression, unspecified: Secondary | ICD-10-CM | POA: Diagnosis not present

## 2023-07-27 ENCOUNTER — Ambulatory Visit (HOSPITAL_BASED_OUTPATIENT_CLINIC_OR_DEPARTMENT_OTHER)
Admission: RE | Admit: 2023-07-27 | Discharge: 2023-07-27 | Disposition: A | Discharge status: Home / Self Care | Source: Ambulatory Visit | Attending: Cardiology | Admitting: Cardiology

## 2023-07-27 ENCOUNTER — Other Ambulatory Visit: Payer: Self-pay

## 2023-07-27 DIAGNOSIS — I480 Paroxysmal atrial fibrillation: Secondary | ICD-10-CM | POA: Diagnosis not present

## 2023-07-27 LAB — ECHOCARDIOGRAM COMPLETE
AR max vel: 2.38 cm2
AV Area VTI: 2.29 cm2
AV Area mean vel: 2.1 cm2
AV Mean grad: 5.5 mmHg
AV Peak grad: 9.2 mmHg
Ao pk vel: 1.52 m/s
Area-P 1/2: 4.86 cm2
Calc EF: 68.1 %
MV M vel: 3.59 m/s
MV Peak grad: 51.6 mmHg
S' Lateral: 2 cm
Single Plane A2C EF: 68.6 %
Single Plane A4C EF: 67.8 %

## 2023-07-28 ENCOUNTER — Other Ambulatory Visit: Payer: Self-pay | Admitting: Physician Assistant

## 2023-07-28 DIAGNOSIS — Z01818 Encounter for other preprocedural examination: Secondary | ICD-10-CM

## 2023-07-28 NOTE — Progress Notes (Signed)
 Patient for IR Bone Marrow Biopsy on Monday 07/31/23, I called and spoke with the patient's wife, Paola Bohr on the phone and gave pre-procedure instructions. Paola Bohr was made aware to have the patient here at 7:30a, NPO after MN prior to procedure as well as driver post procedure/recovery/discharge. Paola Bohr stated understanding.  Called 07/28/23

## 2023-07-31 ENCOUNTER — Other Ambulatory Visit: Payer: Self-pay

## 2023-07-31 ENCOUNTER — Ambulatory Visit
Admission: RE | Admit: 2023-07-31 | Discharge: 2023-07-31 | Disposition: A | Discharge status: Home / Self Care | Source: Ambulatory Visit | Attending: Physician Assistant | Admitting: Physician Assistant

## 2023-07-31 DIAGNOSIS — M899 Disorder of bone, unspecified: Secondary | ICD-10-CM | POA: Diagnosis not present

## 2023-07-31 DIAGNOSIS — J690 Pneumonitis due to inhalation of food and vomit: Secondary | ICD-10-CM | POA: Diagnosis not present

## 2023-07-31 DIAGNOSIS — M898X9 Other specified disorders of bone, unspecified site: Secondary | ICD-10-CM

## 2023-07-31 DIAGNOSIS — K760 Fatty (change of) liver, not elsewhere classified: Secondary | ICD-10-CM | POA: Diagnosis not present

## 2023-07-31 DIAGNOSIS — C9000 Multiple myeloma not having achieved remission: Secondary | ICD-10-CM | POA: Insufficient documentation

## 2023-07-31 DIAGNOSIS — D472 Monoclonal gammopathy: Secondary | ICD-10-CM | POA: Diagnosis not present

## 2023-07-31 DIAGNOSIS — Z85828 Personal history of other malignant neoplasm of skin: Secondary | ICD-10-CM | POA: Diagnosis not present

## 2023-07-31 DIAGNOSIS — F32A Depression, unspecified: Secondary | ICD-10-CM | POA: Insufficient documentation

## 2023-07-31 DIAGNOSIS — G603 Idiopathic progressive neuropathy: Secondary | ICD-10-CM | POA: Diagnosis not present

## 2023-07-31 DIAGNOSIS — E785 Hyperlipidemia, unspecified: Secondary | ICD-10-CM | POA: Insufficient documentation

## 2023-07-31 DIAGNOSIS — K219 Gastro-esophageal reflux disease without esophagitis: Secondary | ICD-10-CM | POA: Diagnosis not present

## 2023-07-31 DIAGNOSIS — I480 Paroxysmal atrial fibrillation: Secondary | ICD-10-CM | POA: Insufficient documentation

## 2023-07-31 DIAGNOSIS — F419 Anxiety disorder, unspecified: Secondary | ICD-10-CM | POA: Diagnosis not present

## 2023-07-31 DIAGNOSIS — M109 Gout, unspecified: Secondary | ICD-10-CM | POA: Diagnosis not present

## 2023-07-31 DIAGNOSIS — G4733 Obstructive sleep apnea (adult) (pediatric): Secondary | ICD-10-CM | POA: Diagnosis not present

## 2023-07-31 DIAGNOSIS — Z1379 Encounter for other screening for genetic and chromosomal anomalies: Secondary | ICD-10-CM | POA: Insufficient documentation

## 2023-07-31 DIAGNOSIS — D72828 Other elevated white blood cell count: Secondary | ICD-10-CM | POA: Diagnosis not present

## 2023-07-31 DIAGNOSIS — J101 Influenza due to other identified influenza virus with other respiratory manifestations: Secondary | ICD-10-CM | POA: Diagnosis not present

## 2023-07-31 DIAGNOSIS — D47Z9 Other specified neoplasms of uncertain behavior of lymphoid, hematopoietic and related tissue: Secondary | ICD-10-CM | POA: Diagnosis not present

## 2023-07-31 DIAGNOSIS — I10 Essential (primary) hypertension: Secondary | ICD-10-CM | POA: Diagnosis not present

## 2023-07-31 DIAGNOSIS — D6489 Other specified anemias: Secondary | ICD-10-CM | POA: Diagnosis not present

## 2023-07-31 DIAGNOSIS — Z01818 Encounter for other preprocedural examination: Secondary | ICD-10-CM

## 2023-07-31 HISTORY — PX: IR BONE MARROW BIOPSY & ASPIRATION: IMG5727

## 2023-07-31 LAB — CBC WITH DIFFERENTIAL/PLATELET
Abs Immature Granulocytes: 0.02 10*3/uL (ref 0.00–0.07)
Basophils Absolute: 0 10*3/uL (ref 0.0–0.1)
Basophils Relative: 0 %
Eosinophils Absolute: 0.1 10*3/uL (ref 0.0–0.5)
Eosinophils Relative: 2 %
HCT: 33.8 % — ABNORMAL LOW (ref 39.0–52.0)
Hemoglobin: 11.3 g/dL — ABNORMAL LOW (ref 13.0–17.0)
Immature Granulocytes: 0 %
Lymphocytes Relative: 16 %
Lymphs Abs: 0.7 10*3/uL (ref 0.7–4.0)
MCH: 31.8 pg (ref 26.0–34.0)
MCHC: 33.4 g/dL (ref 30.0–36.0)
MCV: 95.2 fL (ref 80.0–100.0)
Monocytes Absolute: 0.7 10*3/uL (ref 0.1–1.0)
Monocytes Relative: 14 %
Neutro Abs: 3.1 10*3/uL (ref 1.7–7.7)
Neutrophils Relative %: 68 %
Platelets: 345 10*3/uL (ref 150–400)
RBC: 3.55 MIL/uL — ABNORMAL LOW (ref 4.22–5.81)
RDW: 15.8 % — ABNORMAL HIGH (ref 11.5–15.5)
WBC: 4.7 10*3/uL (ref 4.0–10.5)
nRBC: 0 % (ref 0.0–0.2)

## 2023-07-31 MED ORDER — MIDAZOLAM HCL 2 MG/2ML IJ SOLN
INTRAMUSCULAR | Status: AC | PRN
  Administered 2023-07-31: 1 mg via INTRAVENOUS

## 2023-07-31 MED ORDER — FENTANYL CITRATE (PF) 100 MCG/2ML IJ SOLN
INTRAMUSCULAR | Status: AC
  Filled 2023-07-31: qty 2

## 2023-07-31 MED ORDER — MIDAZOLAM HCL 2 MG/2ML IJ SOLN
INTRAMUSCULAR | Status: AC
  Filled 2023-07-31: qty 2

## 2023-07-31 MED ORDER — LIDOCAINE 1 % OPTIME INJ - NO CHARGE
10.0000 mL | Freq: Once | INTRAMUSCULAR | Status: AC
  Administered 2023-07-31: 10 mL via INTRADERMAL
  Filled 2023-07-31: qty 10

## 2023-07-31 MED ORDER — HEPARIN SOD (PORK) LOCK FLUSH 100 UNIT/ML IV SOLN
INTRAVENOUS | Status: AC
  Filled 2023-07-31: qty 5

## 2023-07-31 MED ORDER — FENTANYL CITRATE (PF) 100 MCG/2ML IJ SOLN
INTRAMUSCULAR | Status: AC | PRN
  Administered 2023-07-31: 50 ug via INTRAVENOUS

## 2023-07-31 MED ORDER — SODIUM CHLORIDE 0.9 % IV SOLN: INTRAVENOUS | Status: DC

## 2023-07-31 NOTE — Procedures (Signed)
 Interventional Radiology Procedure Note  Procedure: Fluoro guided aspirate and core biopsy of left iliac bone  Complications: None  Recommendations: - Bedrest supine x 1 hrs - Hydrocodone  PRN  Pain - Follow biopsy results  Signed,  Roxie Cord, MD

## 2023-07-31 NOTE — Progress Notes (Signed)
 Patient clinically stable post IR BMB per Dr Marne Sings, tolerated well. Vitals stable pre and post procedure. Denies complaints post procedure. Report given to Odella Bending RN post procedure/specials/15 received Versed 1 mg along with Fentanyl 50 mcg IV for procedure.

## 2023-07-31 NOTE — H&P (Signed)
 Chief Complaint: Patient was seen in consultation today for monoclonal gammopathy, concern for multiple myeloma.  Referring Physician(s): Buck Carbon  Supervising Physician: Fernando Hoyer  Patient Status: [facility] - Out-pt  History of Present Illness: David Mcgrath is a 73 y.o. male with a past medical history significant for depression, anxiety, GERD, HTN, HLD, paroxysmal a.fib, gout who presents today for bone marrow aspiration/biopsy. David Mcgrath was first noted to have lytic lesions on CT abd/pelvis and CTA chest in March 2025 during an admission for influenza A and pneumonia. David Mcgrath was referred to hematology/oncology and after further lab evaluation there was concern for possible multiple myeloma. David Mcgrath presents today for bone marrow aspiration/biopsy to further direct care.  Patient seen in IR holding, no concerns - is feeling well overall and ready to proceed as planned. Last PO prior to midnight except for a few sips of water with medications.  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  Patient reports David Mcgrath does not have symptoms as of 02/25/14.     Diarrhea 06/04/2023   Essential hypertension 06/04/2023   Fatty liver 06/04/2023   Flu 06/04/2023   Hyperlipidemia 06/04/2023   Ileus (HCC) 06/04/2023   Neuropathy    Paroxysmal atrial fibrillation (HCC) 07/04/2023   Pneumonia 06/04/2023    Past Surgical History:  Procedure Laterality Date   NO PAST SURGERIES      Allergies: Shrimp [shellfish allergy]  Medications: Prior to Admission medications   Medication Sig Start Date End Date Taking? Authorizing Provider  allopurinol  (ZYLOPRIM ) 300 MG tablet Take 300 mg by mouth daily.   Yes [provider]  apixaban  (ELIQUIS ) 5 MG TABS tablet  Take 1 tablet (5 mg total) by mouth 2 (two) times daily. 07/04/23  Yes Revankar, Rajan R, MD  Cholecalciferol (VITAMIN D-3) 125 MCG (5000 UT) TABS Take 5,000 Units by mouth daily.   Yes [provider]  Cyanocobalamin  (VITAMIN B12) 1000 MCG TBCR Take 1,000 mcg by mouth daily.   Yes [provider]  digoxin  (LANOXIN ) 0.125 MG tablet Digoxin  0.25 mg(2 tablets) daily for 1 week then decrease it to 0.125 mg (1 tablet) daily 07/04/23  Yes Revankar, Rajan R, MD  ezetimibe (ZETIA) 10 MG tablet Take 10 mg by mouth every evening. 06/01/19  Yes [provider]  FLUoxetine  (PROZAC ) 20 MG tablet Take 20 mg by mouth daily.   Yes [provider]  gabapentin  (NEURONTIN ) 300 MG capsule Take 2 capsules (600 mg total) by mouth 2 (two) times daily. 06/15/23  Yes Verlyn Goad, MD  melatonin 5 MG TABS Take 5 mg by mouth at bedtime.   Yes [provider]  pravastatin (PRAVACHOL) 80 MG tablet Take 80 mg by mouth at bedtime.   Yes [provider]  pyridoxine (B-6) 100 MG tablet Take 100 mg by mouth daily.   Yes [provider]  apixaban  (ELIQUIS ) 5 MG TABS tablet Take 1 tablet (5 mg total) by mouth 2 (two) times daily. 07/04/23   Revankar, Micael Adas, MD     Family History  Problem Relation Age of Onset   Diabetes Mother    Cancer Mother    Heart disease Mother    Cancer Father    Hyperlipidemia Father    Neuropathy Paternal Uncle  Migraines Neg Hx     Social History   Socioeconomic History   Marital status: Married    Spouse name: Nellie Banas   Number of children: 0   Years of education: Not on file   Highest education level: Not on file  Occupational History   Occupation: Event organiser  Tobacco Use   Smoking status: Never   Smokeless tobacco: Never  Substance and Sexual Activity   Alcohol  use: Yes    Comment: socialy   Drug use: No   Sexual activity: Not on file  Other Topics Concern   Not on file  Social History Narrative   Lives with spouse    Right handed   Drinks 4+ cups of caffeine daily   Retired    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 investment banker Strain: Not on file  Food Insecurity: No Food Insecurity (06/04/2023)   Hunger Vital Sign    Worried About Running Out of Food in the Last Year: Never true    Ran Out of Food in the Last Year: Never true  Transportation Needs: No Transportation Needs (06/04/2023)   PRAPARE - Administrator, Civil Service (Medical): No    Lack of Transportation (Non-Medical): No  Physical Activity: Not on file  Stress: Not on file  Social Connections: Socially Integrated (06/04/2023)   Social Connection and Isolation Panel [NHANES]    Frequency of Communication with Friends and Family: More than three times a week    Frequency of Social Gatherings with Friends and Family: Twice a week    Attends Religious Services: More than 4 times per year    Active Member of [employer] or Organizations: Yes    Attends Engineer, structural: More than 4 times per year    Marital Status: Married     Review of Systems: A 12 point ROS discussed and pertinent positives are indicated in the HPI above.  All other systems are negative.  Review of Systems  Constitutional:  Negative for chills and fever.  Respiratory:  Negative for cough and shortness of breath.   Cardiovascular:  Negative for chest pain.  Gastrointestinal:  Negative for abdominal pain, diarrhea, nausea and vomiting.  Musculoskeletal:  Negative for back pain.  Neurological:  Negative for dizziness and headaches.    Vital Signs: BP (!) 153/72   Pulse 84   Temp 97.9 F (36.6 C) (Oral)   Resp 20   Ht 6\' 4"  (1.93 m)   Wt 220 lb (99.8 kg)   SpO2 94%   BMI 26.78 kg/m   Physical Exam Vitals reviewed.  Constitutional:      General: David Mcgrath is not in acute distress. HENT:     Head: Normocephalic.     Mouth/Throat:     Mouth: Mucous membranes are moist.     Pharynx: Oropharynx is clear. No oropharyngeal exudate or posterior  oropharyngeal erythema.  Cardiovascular:     Rate and Rhythm: Normal rate and regular rhythm.  Pulmonary:     Effort: Pulmonary effort is normal.     Breath sounds: Normal breath sounds.  Abdominal:     General: There is no distension.     Palpations: Abdomen is soft.     Tenderness: There is no abdominal tenderness.  Skin:    General: Skin is warm and dry.     Coloration: Skin is not pale.  Neurological:     Mental Status: David Mcgrath is alert and oriented to person, place, and time.  Psychiatric:  Mood and Affect: Mood normal.        Behavior: Behavior normal.        Thought Content: Thought content normal.        Judgment: Judgment normal.      MD Evaluation Airway: WNL Heart: WNL Abdomen: WNL Chest/ Lungs: WNL ASA  Classification: 3 Mallampati/Airway Score: One   Imaging: ECHOCARDIOGRAM COMPLETE Result Date: 07/27/2023    ECHOCARDIOGRAM REPORT   Patient Name:   David Mcgrath Date of Exam: 07/27/2023 Medical Rec #:  [id number]               Height:       76.0 in Accession #:    [id number]              Weight:       215.9 lb Date of Birth:  03-Feb-1951               BSA:          2.288 m Patient Age:    73 years                BP:           129/67 mmHg Patient Gender: M                       HR:           70 bpm. Exam Location:  [place] Procedure: 2D Echo, Cardiac Doppler and Color Doppler (Both Spectral and Color            Flow Doppler were utilized during procedure). Indications:    Paroxysmal atrial fibrillation (HCC) [I48.0 (ICD-10-CM)  History:        Patient has no prior history of Echocardiogram examinations.                 Arrythmias:Atrial Fibrillation and Tachycardia; Risk                 Factors:Hypertension, Dyslipidemia and Non-Smoker.  Sonographer:    Lyndal Sandy RDMS, RVT, RDCS Referring Phys: [id number] RAJAN R [facility] IMPRESSIONS  1. Left ventricular ejection fraction, by estimation, is 60 to 65%. The left ventricle has normal function. The left ventricle  has no regional wall motion abnormalities. Left ventricular diastolic parameters were normal.  2. Right ventricular systolic function is normal. The right ventricular size is normal.  3. The mitral valve is normal in structure. Mild mitral valve regurgitation. No evidence of mitral stenosis.  4. The aortic valve is calcified. There is moderate calcification of the aortic valve. There is moderate thickening of the aortic valve. Aortic valve regurgitation is not visualized. No aortic stenosis is present.  5. The inferior vena cava is normal in size with greater than 50% respiratory variability, suggesting right atrial pressure of 3 mmHg. FINDINGS  Left Ventricle: Left ventricular ejection fraction, by estimation, is 60 to 65%. The left ventricle has normal function. The left ventricle has no regional wall motion abnormalities. The left ventricular internal cavity size was normal in size. There is  no left ventricular hypertrophy. Left ventricular diastolic parameters were normal. Right Ventricle: The right ventricular size is normal. No increase in right ventricular wall thickness. Right ventricular systolic function is normal. Left Atrium: Left atrial size was normal in size. Right Atrium: Right atrial size was normal in size. Pericardium: There is no evidence of pericardial effusion. Mitral Valve: The mitral valve is normal in structure. Mild mitral valve  regurgitation. No evidence of mitral valve stenosis. Tricuspid Valve: The tricuspid valve is normal in structure. Tricuspid valve regurgitation is mild . No evidence of tricuspid stenosis. Aortic Valve: The aortic valve is calcified. There is moderate calcification of the aortic valve. There is moderate thickening of the aortic valve. Aortic valve regurgitation is not visualized. No aortic stenosis is present. Aortic valve mean gradient measures 5.5 mmHg. Aortic valve peak gradient measures 9.2 mmHg. Aortic valve area, by VTI measures 2.29 cm. Pulmonic Valve: The  pulmonic valve was normal in structure. Pulmonic valve regurgitation is not visualized. No evidence of pulmonic stenosis. Aorta: The aortic root is normal in size and structure. Venous: The inferior vena cava is normal in size with greater than 50% respiratory variability, suggesting right atrial pressure of 3 mmHg. IAS/Shunts: No atrial level shunt detected by color flow Doppler.  LEFT VENTRICLE PLAX 2D LVIDd:         3.20 cm      Diastology LVIDs:         2.00 cm      LV e' medial:    7.83 cm/s LV PW:         1.10 cm      LV E/e' medial:  11.1 LV IVS:        1.50 cm      LV e' lateral:   9.68 cm/s LVOT diam:     2.00 cm      LV E/e' lateral: 9.0 LV SV:         75 LV SV Index:   33 LVOT Area:     3.14 cm  LV Volumes (MOD) LV vol d, MOD A2C: 113.0 ml LV vol d, MOD A4C: 95.8 ml LV vol s, MOD A2C: 35.5 ml LV vol s, MOD A4C: 30.8 ml LV SV MOD A2C:     77.5 ml LV SV MOD A4C:     95.8 ml LV SV MOD BP:      70.1 ml RIGHT VENTRICLE RV S prime:     11.90 cm/s TAPSE (M-mode): 2.1 cm LEFT ATRIUM           Index        RIGHT ATRIUM           Index LA diam:      3.50 cm 1.53 cm/m   RA Area:     19.20 cm LA Vol (A2C): 42.9 ml 18.75 ml/m  RA Volume:   51.50 ml  22.50 ml/m LA Vol (A4C): 81.9 ml 35.79 ml/m  AORTIC VALVE AV Area (Vmax):    2.38 cm AV Area (Vmean):   2.10 cm AV Area (VTI):     2.29 cm AV Vmax:           151.50 cm/s AV Vmean:          110.500 cm/s AV VTI:            0.329 m AV Peak Grad:      9.2 mmHg AV Mean Grad:      5.5 mmHg LVOT Vmax:         115.00 cm/s LVOT Vmean:        74.000 cm/s LVOT VTI:          0.240 m LVOT/AV VTI ratio: 0.73  AORTA Ao Root diam: 4.00 cm Ao Asc diam:  3.50 cm MITRAL VALVE               TRICUSPID VALVE MV Area (PHT): 4.86 cm  TR Peak grad:   35.8 mmHg MV Decel Time: 156 msec    TR Vmax:        299.00 cm/s MR Peak grad: 51.6 mmHg MR Vmax:      359.00 cm/s  SHUNTS MV E velocity: 87.30 cm/s  Systemic VTI:  0.24 m MV A velocity: 76.60 cm/s  Systemic Diam: 2.00 cm MV E/A ratio:   1.14 Ralene Burger MD Electronically signed by Ralene Burger MD Signature Date/Time: 07/27/2023/9:25:28 PM    Final    DG Bone Survey Met Result Date: 07/20/2023 CLINICAL DATA:  Multiple myeloma.  Lytic lesions on prior CT EXAM: METASTATIC BONE SURVEY COMPARISON:  CT chest, abdomen, and pelvis dated 06/08/2023 FINDINGS: Expansile lucent lesions are again seen in the right T1 transverse process, posterior right sixth rib, and left posteromedial fourth rib. Previously noted lucencies in the pelvis and lumbar spine are not well seen. Old left posterolateral seventh rib fracture. Multilevel degenerative changes of the cervical spine, most pronounced at C4-C7, characterized by intervertebral disc space narrowing and anterior disc osteophyte formation. Grade 1 anterolisthesis at C3-4 and C7-T1. Multilevel degenerative changes of the thoracolumbar spine, bilateral hips, right-greater-than-left, and bilateral knees. IMPRESSION: Expansile lucent lesions in the right T1 transverse process, posterior right sixth rib, and left posteromedial fourth rib, as before. Previously noted lucencies in the pelvis and lumbar spine are not well seen. Otherwise, no new lucent lesions. Osseous Electronically Signed   By: Limin  Xu M.D.   On: 07/20/2023 12:49    Labs:  CBC: Recent Labs    06/10/23 1246 06/13/23 1149 06/15/23 0549 07/13/23 1350  WBC 14.7* 15.6* 11.8* 5.1  HGB 11.0* 10.7* 10.9* 12.2*  HCT 34.0* 31.9* 33.2* 35.7*  PLT 291 350 376 334    COAGS: No results for input(s): "INR", "APTT" in the last 8760 hours.  BMP: Recent Labs    06/09/23 0458 06/11/23 0520 06/14/23 0536 07/04/23 1546 07/13/23 1350  NA 137 133* 134* 137 138  K 4.0 4.4 4.3 4.2 3.9  CL 107 106 102 99 104  CO2 20* 20* 21* 22 29  GLUCOSE 70 127* 105* 108* 101*  BUN 15 20 20 10  7*  CALCIUM 8.2* 8.6* 9.2 9.1 9.3  CREATININE 0.83 0.82 0.66 0.70* 0.76  GFRNONAA >60 >60 >60  --  >60    LIVER FUNCTION TESTS: Recent Labs     06/07/23 0533 06/08/23 0520 06/09/23 0458 07/13/23 1350  BILITOT 0.5 0.7 1.1 0.5  AST 59* 62* 57* 37  ALT 41 47* 46* 30  ALKPHOS 40 43 47 75  PROT 5.0* 5.4* 5.5* 6.7  ALBUMIN 2.2* 2.4* 2.3* 3.7    TUMOR MARKERS: No results for input(s): "AFPTM", "CEA", "CA199", "CHROMGRNA" in the last 8760 hours.  Assessment and Plan:  73 y/o M with recently noted lytic bone lesions with no solid tumor identified who presents today for bone marrow aspiration/biopsy for further evaluation.  Risks and benefits of bone marrow aspiration/biopsy was discussed with the patient and/or patient's family including, but not limited to bleeding, infection, damage to adjacent structures or low yield requiring additional tests.  All of the questions were answered and there is agreement to proceed.  Consent signed and in chart.  Thank you for this interesting consult.  I greatly enjoyed meeting David Mcgrath and look forward to participating in their care.  A copy of this report was sent to the requesting provider on this date.  Electronically Signed: Marilu Shown, PA-C 07/31/2023, 7:59  AM   I spent a total of 30 Minutes   in face to face in clinical consultation, greater than 50% of which was counseling/coordinating care for lytic bone lesions.

## 2023-08-01 ENCOUNTER — Ambulatory Visit: Attending: Cardiology | Admitting: Cardiology

## 2023-08-01 ENCOUNTER — Other Ambulatory Visit: Payer: Self-pay

## 2023-08-01 ENCOUNTER — Encounter: Payer: Self-pay | Admitting: Cardiology

## 2023-08-01 VITALS — BP 128/62 | HR 90 | Ht 76.0 in | Wt 223.0 lb

## 2023-08-01 DIAGNOSIS — K219 Gastro-esophageal reflux disease without esophagitis: Secondary | ICD-10-CM | POA: Diagnosis not present

## 2023-08-01 DIAGNOSIS — J101 Influenza due to other identified influenza virus with other respiratory manifestations: Secondary | ICD-10-CM | POA: Diagnosis not present

## 2023-08-01 DIAGNOSIS — K760 Fatty (change of) liver, not elsewhere classified: Secondary | ICD-10-CM | POA: Diagnosis not present

## 2023-08-01 DIAGNOSIS — M899 Disorder of bone, unspecified: Secondary | ICD-10-CM | POA: Diagnosis not present

## 2023-08-01 DIAGNOSIS — I480 Paroxysmal atrial fibrillation: Secondary | ICD-10-CM

## 2023-08-01 DIAGNOSIS — G603 Idiopathic progressive neuropathy: Secondary | ICD-10-CM | POA: Diagnosis not present

## 2023-08-01 DIAGNOSIS — M109 Gout, unspecified: Secondary | ICD-10-CM | POA: Diagnosis not present

## 2023-08-01 DIAGNOSIS — F32A Depression, unspecified: Secondary | ICD-10-CM | POA: Diagnosis not present

## 2023-08-01 DIAGNOSIS — J690 Pneumonitis due to inhalation of food and vomit: Secondary | ICD-10-CM | POA: Diagnosis not present

## 2023-08-01 DIAGNOSIS — E785 Hyperlipidemia, unspecified: Secondary | ICD-10-CM | POA: Diagnosis not present

## 2023-08-01 DIAGNOSIS — I10 Essential (primary) hypertension: Secondary | ICD-10-CM

## 2023-08-01 DIAGNOSIS — G4733 Obstructive sleep apnea (adult) (pediatric): Secondary | ICD-10-CM | POA: Diagnosis not present

## 2023-08-01 DIAGNOSIS — F419 Anxiety disorder, unspecified: Secondary | ICD-10-CM | POA: Diagnosis not present

## 2023-08-01 DIAGNOSIS — Z85828 Personal history of other malignant neoplasm of skin: Secondary | ICD-10-CM | POA: Diagnosis not present

## 2023-08-01 NOTE — H&P (View-Only) (Signed)
 Cardiology Office Note:    Date:  08/01/2023   ID:  David Mcgrath, DOB 10/24/1950, MRN [id number]  PCP:  Windell Hasty, DO  Cardiologist:  Nelia Balzarine, MD   Referring MD: Windell Hasty, DO    ASSESSMENT:    1. Hyperlipidemia, unspecified hyperlipidemia type   2. Paroxysmal atrial fibrillation (HCC)   3. Essential hypertension    PLAN:    In order of problems listed above:  Primary prevention stressed with the patient.  Importance of compliance with diet medication stressed and patient verbalized standing. Mixed dyslipidemia: On lipid-lowering medications followed by primary care. Paroxysmal atrial fibrillation:I discussed with the patient atrial fibrillation, disease process. Management and therapy including rate and rhythm control, anticoagulation benefits and potential risks were discussed extensively with the patient. Patient had multiple questions which were answered to patient's satisfaction.  He has taken medications meticulously without missing a dose.  I discussed cardioversion with him and his wife.  Procedure, benefits and potential risks explained and he vocalized understanding and questions were answered to his satisfaction.  He will be scheduled for a cardioversion.  Will have Chem-7 and dig level done today. Essential hypertension: Blood pressure is stable and diet was emphasized. Patient will be seen in follow-up appointment in 6 months or earlier if the patient has any concerns.    Medication Adjustments/Labs and Tests Ordered: Current medicines are reviewed at length with the patient today.  Concerns regarding medicines are outlined above.  Orders Placed This Encounter  Procedures   EKG 12-Lead   No orders of the defined types were placed in this encounter.    No chief complaint on file.    History of Present Illness:    David Mcgrath is a 73 y.o. male.  Patient has past medical history of paroxysmal atrial fibrillation,  essential hypertension.  I initiated him on digoxin  therapy for rate control.  He is doing fine.  He still has some dyspnea on exertion.  He is taken anticoagulation meticulously without missing a single dose since last evaluation.  At the time of my evaluation, the patient is alert awake oriented and in no distress.  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  Patient reports he does not have symptoms as of 02/25/14.     Diarrhea 06/04/2023   Essential hypertension 06/04/2023   Fatty liver 06/04/2023   Flu 06/04/2023   Hyperlipidemia 06/04/2023   Ileus (HCC) 06/04/2023   Neuropathy    Paroxysmal atrial fibrillation (HCC) 07/04/2023   Pneumonia 06/04/2023    Past Surgical History:  Procedure Laterality Date   IR BONE MARROW BIOPSY & ASPIRATION  07/31/2023   NO PAST SURGERIES      Current Medications: Current Meds  Medication Sig   allopurinol  (ZYLOPRIM ) 300 MG tablet Take 300 mg by mouth daily.   apixaban  (ELIQUIS ) 5 MG TABS tablet Take 1 tablet (5 mg total) by mouth 2 (two) times daily.   Cholecalciferol (VITAMIN D-3) 125 MCG (5000 UT) TABS Take 5,000 Units by mouth daily.   Cyanocobalamin  (VITAMIN B12) 1000 MCG TBCR Take 1,000 mcg by mouth daily.   digoxin  (LANOXIN ) 0.125 MG tablet Digoxin  0.25 mg(2 tablets) daily for 1 week then decrease it to 0.125 mg (1 tablet) daily   ezetimibe (ZETIA)  10 MG tablet Take 10 mg by mouth every evening.   FLUoxetine  (PROZAC ) 20 MG tablet Take 20 mg by mouth daily.   gabapentin  (NEURONTIN ) 300 MG capsule Take 2 capsules (600 mg total) by mouth 2 (two) times daily.   guaiFENesin  (MUCINEX ) 600 MG 12 hr tablet Take 600 mg by mouth daily.   melatonin 5 MG TABS Take 5 mg by mouth at bedtime.   omeprazole (PRILOSEC) 40 MG capsule Take 40 mg by  mouth daily.   pravastatin (PRAVACHOL) 80 MG tablet Take 80 mg by mouth at bedtime.   pyridoxine (B-6) 100 MG tablet Take 100 mg by mouth daily.     Allergies:   Shrimp [shellfish allergy]   Social History   Socioeconomic History   Marital status: Married    Spouse name: Nellie Banas   Number of children: 0   Years of education: Not on file   Highest education level: Not on file  Occupational History   Occupation: Event organiser  Tobacco Use   Smoking status: Never   Smokeless tobacco: Never  Substance and Sexual Activity   Alcohol  use: Yes    Comment: socialy   Drug use: No   Sexual activity: Not on file  Other Topics Concern   Not on file  Social History Narrative   Lives with spouse   Right handed   Drinks 4+ cups of caffeine daily   Retired    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 investment banker Strain: Not on file  Food Insecurity: No Food Insecurity (06/04/2023)   Hunger Vital Sign    Worried About Running Out of Food in the Last Year: Never true    Ran Out of Food in the Last Year: Never true  Transportation Needs: No Transportation Needs (06/04/2023)   PRAPARE - Administrator, Civil Service (Medical): No    Lack of Transportation (Non-Medical): No  Physical Activity: Not on file  Stress: Not on file  Social Connections: Socially Integrated (06/04/2023)   Social Connection and Isolation Panel [NHANES]    Frequency of Communication with Friends and Family: More than three times a week    Frequency of Social Gatherings with Friends and Family: Twice a week    Attends Religious Services: More than 4 times per year    Active Member of [employer] or Organizations: Yes    Attends Engineer, structural: More than 4 times per year    Marital Status: Married     Family History: The patient's family history includes Cancer in his father and mother; Diabetes in his mother; Heart disease in his mother; Hyperlipidemia in his father; Neuropathy in his paternal uncle. There  is no history of Migraines.  ROS:   Please see the history of present illness.    All other systems reviewed and are negative.  EKGs/Labs/Other Studies Reviewed:    The following studies were reviewed today: .Aaron AasEKG Interpretation Date/Time:  Tuesday August 01 2023 11:41:31 EDT Ventricular Rate:  90 PR Interval:    QRS Duration:  90 QT Interval:  370 QTC Calculation: 452 R Axis:   97  Text Interpretation: Atrial fibrillation with premature ventricular or aberrantly conducted complexes Rightward axis When compared with ECG of 04-Jul-2023 14:45, Borderline criteria for Inferior infarct are no longer Present Confirmed by Hillis Lu [phone]) on 08/01/2023 12:01:09 PM     Recent Labs: 07/04/2023: Magnesium 1.6; TSH 4.560 07/13/2023: ALT 30; BUN 7; Creatinine 0.76; Potassium 3.9; Sodium 138 07/31/2023: Hemoglobin 11.3; Platelets 345  Recent Lipid Panel No results found for: "CHOL", "TRIG", "HDL", "CHOLHDL", "VLDL", "LDLCALC", "LDLDIRECT"  Physical Exam:    VS:  BP 128/62   Pulse 90   Ht 6\' 4"  (1.93 m)   Wt 223 lb (101.2 kg)   SpO2 95%   BMI 27.14 kg/m     Wt Readings from Last 3 Encounters:  08/01/23 223 lb (101.2 kg)  07/31/23 220 lb (99.8 kg)  07/13/23 215 lb 14.4 oz (97.9 kg)     GEN: Patient is in no acute distress HEENT: Normal NECK: No JVD; No carotid bruits LYMPHATICS: No lymphadenopathy CARDIAC: Hear sounds regular, 2/6 systolic murmur at the apex. RESPIRATORY:  Clear to auscultation without rales, wheezing or rhonchi  ABDOMEN: Soft, non-tender, non-distended MUSCULOSKELETAL:  No edema; No deformity  SKIN: Warm and dry NEUROLOGIC:  Alert and oriented x 3 PSYCHIATRIC:  Normal affect   Signed, Nelia Balzarine, MD  08/01/2023 12:00 PM    [facility]

## 2023-08-01 NOTE — Patient Instructions (Addendum)
 Medication Instructions:  Your physician recommends that you continue on your current medications as directed. Please refer to the Current Medication list given to you today.  *If you need a refill on your cardiac medications before your next appointment, please call your pharmacy*  Lab Work: Your physician recommends that you return for lab work in:   Labs today: BMP, CBC, Digoxin  level  If you have labs (blood work) drawn today and your tests are completely normal, you will receive your results only by: MyChart Message (if you have MyChart) OR A paper copy in the mail If you have any lab test that is abnormal or we need to change your treatment, we will call you to review the results.  Testing/Procedures:   Dear David Mcgrath  You are scheduled for a Cardioversion on Thursday, May 8th with Dr. Emmette Harms.  Please arrive at the [facility] (Main Entrance A) at [facility]: [address] [place], [place] [address] at 8:00 am (This time is 1 hour(s) before your procedure to ensure your preparation).   Free valet parking service is available. You will check in at ADMITTING.   *Please Note: You will receive a call the day before your procedure to confirm the appointment time. That time may have changed from the original time based on the schedule for that day.*   DIET:  Nothing to eat or drink after midnight except a sip of water with medications (see medication instructions below)  MEDICATION INSTRUCTIONS: !!IF ANY NEW MEDICATIONS ARE STARTED AFTER TODAY, PLEASE NOTIFY YOUR PROVIDER AS SOON AS POSSIBLE!!  FYI: Medications such as Semaglutide (Ozempic, [place]), Tirzepatide (Mounjaro, Zepbound), Dulaglutide (Trulicity), etc ("GLP1 agonists") AND Canagliflozin (Invokana), Dapagliflozin (Farxiga), Empagliflozin (Jardiance), Ertugliflozin (Steglatro), Bexagliflozin [employer]) or any combination with one of these drugs such as Invokamet (Canagliflozin/Metformin), Synjardy  (Empagliflozin/Metformin), etc ("SGLT2 inhibitors") must be held around the time of a procedure. This is not a comprehensive list of all of these drugs. Please review all of your medications and talk to your provider if you take any one of these. If you are not sure, ask your provider.   Continue taking your anticoagulant (blood thinner): Apixaban  (Eliquis ).  You will need to continue this after your procedure until you are told by your provider that it is safe to stop.    LABS: BMP, CBC  FYI:  For your safety, and to allow us  to monitor your vital signs accurately during the surgery/procedure we request: If you have artificial nails, gel coating, SNS etc, please have those removed prior to your surgery/procedure. Not having the nail coverings /polish removed may result in cancellation or delay of your surgery/procedure.  Your support person will be asked to wait in the waiting room during your procedure.  It is OK to have someone drop you off and come back when you are ready to be discharged.  You cannot drive after the procedure and will need someone to drive you home.  Bring your insurance cards.  *Special Note: Every effort is made to have your procedure done on time. Occasionally there are emergencies that occur at the hospital that may cause delays. Please be patient if a delay does occur.      Follow-Up: At [facility], you and your health needs are our priority.  As part of our continuing mission to provide you with exceptional heart care, our providers are all part of one team.  This team includes your primary Cardiologist (physician) and Advanced Practice Providers  or APPs (Physician Assistants and Nurse Practitioners) who all work together to provide you with the care you need, when you need it.  Your next appointment:   2 month(s)  Provider:   Hillis Lu, MD    We recommend signing up for the patient portal called "MyChart".  Sign up information is provided on  this After Visit Summary.  MyChart is used to connect with patients for Virtual Visits (Telemedicine).  Patients are able to view lab/test results, encounter notes, upcoming appointments, etc.  Non-urgent messages can be sent to your provider as well.   To learn more about what you can do with MyChart, go to [email or web address].   Other Instructions None

## 2023-08-01 NOTE — Progress Notes (Signed)
 Cardiology Office Note:    Date:  08/01/2023   ID:  David Mcgrath, DOB 10/24/1950, MRN [id number]  PCP:  David Hasty, DO  Cardiologist:  David Balzarine, MD   Referring MD: David Hasty, DO    ASSESSMENT:    1. Hyperlipidemia, unspecified hyperlipidemia type   2. Paroxysmal atrial fibrillation (HCC)   3. Essential hypertension    PLAN:    In order of problems listed above:  Primary prevention stressed with the patient.  Importance of compliance with diet medication stressed and patient verbalized standing. Mixed dyslipidemia: On lipid-lowering medications followed by primary care. Paroxysmal atrial fibrillation:I discussed with the patient atrial fibrillation, disease process. Management and therapy including rate and rhythm control, anticoagulation benefits and potential risks were discussed extensively with the patient. Patient had multiple questions which were answered to patient's satisfaction.  He has taken medications meticulously without missing a dose.  I discussed cardioversion with him and his wife.  Procedure, benefits and potential risks explained and he vocalized understanding and questions were answered to his satisfaction.  He will be scheduled for a cardioversion.  Will have Chem-7 and dig level done today. Essential hypertension: Blood pressure is stable and diet was emphasized. Patient will be seen in follow-up appointment in 6 months or earlier if the patient has any concerns.    Medication Adjustments/Labs and Tests Ordered: Current medicines are reviewed at length with the patient today.  Concerns regarding medicines are outlined above.  Orders Placed This Encounter  Procedures   EKG 12-Lead   No orders of the defined types were placed in this encounter.    No chief complaint on file.    History of Present Illness:    David Mcgrath is a 73 y.o. male.  Patient has past medical history of paroxysmal atrial fibrillation,  essential hypertension.  I initiated him on digoxin  therapy for rate control.  He is doing fine.  He still has some dyspnea on exertion.  He is taken anticoagulation meticulously without missing a single dose since last evaluation.  At the time of my evaluation, the patient is alert awake oriented and in no distress.  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  Patient reports he does not have symptoms as of 02/25/14.     Diarrhea 06/04/2023   Essential hypertension 06/04/2023   Fatty liver 06/04/2023   Flu 06/04/2023   Hyperlipidemia 06/04/2023   Ileus (HCC) 06/04/2023   Neuropathy    Paroxysmal atrial fibrillation (HCC) 07/04/2023   Pneumonia 06/04/2023    Past Surgical History:  Procedure Laterality Date   IR BONE MARROW BIOPSY & ASPIRATION  07/31/2023   NO PAST SURGERIES      Current Medications: Current Meds  Medication Sig   allopurinol  (ZYLOPRIM ) 300 MG tablet Take 300 mg by mouth daily.   apixaban  (ELIQUIS ) 5 MG TABS tablet Take 1 tablet (5 mg total) by mouth 2 (two) times daily.   Cholecalciferol (VITAMIN D-3) 125 MCG (5000 UT) TABS Take 5,000 Units by mouth daily.   Cyanocobalamin  (VITAMIN B12) 1000 MCG TBCR Take 1,000 mcg by mouth daily.   digoxin  (LANOXIN ) 0.125 MG tablet Digoxin  0.25 mg(2 tablets) daily for 1 week then decrease it to 0.125 mg (1 tablet) daily   ezetimibe (ZETIA)  10 MG tablet Take 10 mg by mouth every evening.   FLUoxetine  (PROZAC ) 20 MG tablet Take 20 mg by mouth daily.   gabapentin  (NEURONTIN ) 300 MG capsule Take 2 capsules (600 mg total) by mouth 2 (two) times daily.   guaiFENesin  (MUCINEX ) 600 MG 12 hr tablet Take 600 mg by mouth daily.   melatonin 5 MG TABS Take 5 mg by mouth at bedtime.   omeprazole (PRILOSEC) 40 MG capsule Take 40 mg by  mouth daily.   pravastatin (PRAVACHOL) 80 MG tablet Take 80 mg by mouth at bedtime.   pyridoxine (B-6) 100 MG tablet Take 100 mg by mouth daily.     Allergies:   Shrimp [shellfish allergy]   Social History   Socioeconomic History   Marital status: Married    Spouse name: David Mcgrath   Number of children: 0   Years of education: Not on file   Highest education level: Not on file  Occupational History   Occupation: Event organiser  Tobacco Use   Smoking status: Never   Smokeless tobacco: Never  Substance and Sexual Activity   Alcohol  use: Yes    Comment: socialy   Drug use: No   Sexual activity: Not on file  Other Topics Concern   Not on file  Social History Narrative   Lives with spouse   Right handed   Drinks 4+ cups of caffeine daily   Retired    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 investment banker Strain: Not on file  Food Insecurity: No Food Insecurity (06/04/2023)   Hunger Vital Sign    Worried About Running Out of Food in the Last Year: Never true    Ran Out of Food in the Last Year: Never true  Transportation Needs: No Transportation Needs (06/04/2023)   PRAPARE - Administrator, Civil Service (Medical): No    Lack of Transportation (Non-Medical): No  Physical Activity: Not on file  Stress: Not on file  Social Connections: Socially Integrated (06/04/2023)   Social Connection and Isolation Panel [NHANES]    Frequency of Communication with Friends and Family: More than three times a week    Frequency of Social Gatherings with Friends and Family: Twice a week    Attends Religious Services: More than 4 times per year    Active Member of [employer] or Organizations: Yes    Attends Engineer, structural: More than 4 times per year    Marital Status: Married     Family History: The patient's family history includes Cancer in his father and mother; Diabetes in his mother; Heart disease in his mother; Hyperlipidemia in his father; Neuropathy in his paternal uncle. There  is no history of Migraines.  ROS:   Please see the history of present illness.    All other systems reviewed and are negative.  EKGs/Labs/Other Studies Reviewed:    The following studies were reviewed today: .David AasEKG Interpretation Date/Time:  Tuesday August 01 2023 11:41:31 EDT Ventricular Rate:  90 PR Interval:    QRS Duration:  90 QT Interval:  370 QTC Calculation: 452 R Axis:   97  Text Interpretation: Atrial fibrillation with premature ventricular or aberrantly conducted complexes Rightward axis When compared with ECG of 04-Jul-2023 14:45, Borderline criteria for Inferior infarct are no longer Present Confirmed by Hillis Lu [phone]) on 08/01/2023 12:01:09 PM     Recent Labs: 07/04/2023: Magnesium 1.6; TSH 4.560 07/13/2023: ALT 30; BUN 7; Creatinine 0.76; Potassium 3.9; Sodium 138 07/31/2023: Hemoglobin 11.3; Platelets 345  Recent Lipid Panel No results found for: "CHOL", "TRIG", "HDL", "CHOLHDL", "VLDL", "LDLCALC", "LDLDIRECT"  Physical Exam:    VS:  BP 128/62   Pulse 90   Ht 6\' 4"  (1.93 m)   Wt 223 lb (101.2 kg)   SpO2 95%   BMI 27.14 kg/m     Wt Readings from Last 3 Encounters:  08/01/23 223 lb (101.2 kg)  07/31/23 220 lb (99.8 kg)  07/13/23 215 lb 14.4 oz (97.9 kg)     GEN: Patient is in no acute distress HEENT: Normal NECK: No JVD; No carotid bruits LYMPHATICS: No lymphadenopathy CARDIAC: Hear sounds regular, 2/6 systolic murmur at the apex. RESPIRATORY:  Clear to auscultation without rales, wheezing or rhonchi  ABDOMEN: Soft, non-tender, non-distended MUSCULOSKELETAL:  No edema; No deformity  SKIN: Warm and dry NEUROLOGIC:  Alert and oriented x 3 PSYCHIATRIC:  Normal affect   Signed, David Balzarine, MD  08/01/2023 12:00 PM    [facility]

## 2023-08-02 ENCOUNTER — Telehealth: Payer: Self-pay | Admitting: Physician Assistant

## 2023-08-02 DIAGNOSIS — G603 Idiopathic progressive neuropathy: Secondary | ICD-10-CM | POA: Diagnosis not present

## 2023-08-02 DIAGNOSIS — I480 Paroxysmal atrial fibrillation: Secondary | ICD-10-CM | POA: Diagnosis not present

## 2023-08-02 DIAGNOSIS — J690 Pneumonitis due to inhalation of food and vomit: Secondary | ICD-10-CM | POA: Diagnosis not present

## 2023-08-02 DIAGNOSIS — G4733 Obstructive sleep apnea (adult) (pediatric): Secondary | ICD-10-CM | POA: Diagnosis not present

## 2023-08-02 DIAGNOSIS — E785 Hyperlipidemia, unspecified: Secondary | ICD-10-CM | POA: Diagnosis not present

## 2023-08-02 DIAGNOSIS — M109 Gout, unspecified: Secondary | ICD-10-CM | POA: Diagnosis not present

## 2023-08-02 DIAGNOSIS — J101 Influenza due to other identified influenza virus with other respiratory manifestations: Secondary | ICD-10-CM | POA: Diagnosis not present

## 2023-08-02 DIAGNOSIS — F419 Anxiety disorder, unspecified: Secondary | ICD-10-CM | POA: Diagnosis not present

## 2023-08-02 DIAGNOSIS — K219 Gastro-esophageal reflux disease without esophagitis: Secondary | ICD-10-CM | POA: Diagnosis not present

## 2023-08-02 DIAGNOSIS — Z85828 Personal history of other malignant neoplasm of skin: Secondary | ICD-10-CM | POA: Diagnosis not present

## 2023-08-02 DIAGNOSIS — F32A Depression, unspecified: Secondary | ICD-10-CM | POA: Diagnosis not present

## 2023-08-02 DIAGNOSIS — I10 Essential (primary) hypertension: Secondary | ICD-10-CM | POA: Diagnosis not present

## 2023-08-02 DIAGNOSIS — M899 Disorder of bone, unspecified: Secondary | ICD-10-CM | POA: Diagnosis not present

## 2023-08-02 DIAGNOSIS — K760 Fatty (change of) liver, not elsewhere classified: Secondary | ICD-10-CM | POA: Diagnosis not present

## 2023-08-02 LAB — SURGICAL PATHOLOGY

## 2023-08-02 NOTE — Telephone Encounter (Signed)
 I notified David Mcgrath and spouse by phone regarding bone marrow biopsy results. Findings are consistent with multiple myeloma, cytogenetics and FISH are in process. Patient is scheduled to follow up with Wyline Hearing PA-C to finalize treatment recommendations on 08/08/23. All of patient's questions were answered and they expressed understanding of the plan provided.

## 2023-08-03 DIAGNOSIS — R2681 Unsteadiness on feet: Secondary | ICD-10-CM | POA: Diagnosis not present

## 2023-08-03 DIAGNOSIS — J101 Influenza due to other identified influenza virus with other respiratory manifestations: Secondary | ICD-10-CM | POA: Diagnosis not present

## 2023-08-03 LAB — BASIC METABOLIC PANEL WITH GFR
BUN/Creatinine Ratio: 10 (ref 10–24)
BUN: 7 mg/dL — ABNORMAL LOW (ref 8–27)
CO2: 23 mmol/L (ref 20–29)
Calcium: 8.7 mg/dL (ref 8.6–10.2)
Chloride: 104 mmol/L (ref 96–106)
Creatinine, Ser: 0.72 mg/dL — ABNORMAL LOW (ref 0.76–1.27)
Glucose: 100 mg/dL — ABNORMAL HIGH (ref 70–99)
Potassium: 3.6 mmol/L (ref 3.5–5.2)
Sodium: 141 mmol/L (ref 134–144)
eGFR: 97 mL/min/{1.73_m2} (ref >59)

## 2023-08-03 LAB — CBC
Hematocrit: 32 % — ABNORMAL LOW (ref 37.5–51.0)
Hemoglobin: 10.9 g/dL — ABNORMAL LOW (ref 13.0–17.7)
MCH: 32.8 pg (ref 26.6–33.0)
MCHC: 34.1 g/dL (ref 31.5–35.7)
MCV: 96 fL (ref 79–97)
Platelets: 343 10*3/uL (ref 150–450)
RBC: 3.32 x10E6/uL — ABNORMAL LOW (ref 4.14–5.80)
RDW: 15.1 % (ref 11.6–15.4)
WBC: 5.1 10*3/uL (ref 3.4–10.8)

## 2023-08-03 LAB — DIGOXIN LEVEL: Digoxin, Serum: 0.6 ng/mL (ref 0.5–0.9)

## 2023-08-04 ENCOUNTER — Other Ambulatory Visit: Payer: Self-pay | Admitting: Hematology and Oncology

## 2023-08-04 DIAGNOSIS — C9000 Multiple myeloma not having achieved remission: Secondary | ICD-10-CM | POA: Insufficient documentation

## 2023-08-04 NOTE — Progress Notes (Signed)
 START ON PATHWAY REGIMEN - Multiple Myeloma and Other Plasma Cell Dyscrasias     A cycle is every 21 days:     Lenalidomide      Dexamethasone      Bortezomib   **Always confirm dose/schedule in your pharmacy ordering system**  Patient Characteristics: Multiple Myeloma, Newly Diagnosed, Transplant Ineligible or Refused, Unknown Risk or Awaiting Test Results Disease Classification: Multiple Myeloma Therapeutic Status: Newly Diagnosed R2-ISS Staging: II Is Patient Eligible for Transplant<= Transplant Ineligible or Refused Risk Status: Awaiting Test Results Intent of Therapy: Curative Intent, Discussed with Patient

## 2023-08-07 DIAGNOSIS — Z85828 Personal history of other malignant neoplasm of skin: Secondary | ICD-10-CM | POA: Diagnosis not present

## 2023-08-07 DIAGNOSIS — K760 Fatty (change of) liver, not elsewhere classified: Secondary | ICD-10-CM | POA: Diagnosis not present

## 2023-08-07 DIAGNOSIS — M109 Gout, unspecified: Secondary | ICD-10-CM | POA: Diagnosis not present

## 2023-08-07 DIAGNOSIS — G603 Idiopathic progressive neuropathy: Secondary | ICD-10-CM | POA: Diagnosis not present

## 2023-08-07 DIAGNOSIS — K219 Gastro-esophageal reflux disease without esophagitis: Secondary | ICD-10-CM | POA: Diagnosis not present

## 2023-08-07 DIAGNOSIS — G4733 Obstructive sleep apnea (adult) (pediatric): Secondary | ICD-10-CM | POA: Diagnosis not present

## 2023-08-07 DIAGNOSIS — M899 Disorder of bone, unspecified: Secondary | ICD-10-CM | POA: Diagnosis not present

## 2023-08-07 DIAGNOSIS — F419 Anxiety disorder, unspecified: Secondary | ICD-10-CM | POA: Diagnosis not present

## 2023-08-07 DIAGNOSIS — F32A Depression, unspecified: Secondary | ICD-10-CM | POA: Diagnosis not present

## 2023-08-07 DIAGNOSIS — I10 Essential (primary) hypertension: Secondary | ICD-10-CM | POA: Diagnosis not present

## 2023-08-07 DIAGNOSIS — J101 Influenza due to other identified influenza virus with other respiratory manifestations: Secondary | ICD-10-CM | POA: Diagnosis not present

## 2023-08-07 DIAGNOSIS — J690 Pneumonitis due to inhalation of food and vomit: Secondary | ICD-10-CM | POA: Diagnosis not present

## 2023-08-07 DIAGNOSIS — E785 Hyperlipidemia, unspecified: Secondary | ICD-10-CM | POA: Diagnosis not present

## 2023-08-07 NOTE — Progress Notes (Deleted)
 [facility] [phone]   [phone]  PROGRESS:  Patient Care Team: Windell Hasty, DO as PCP - General (Internal Medicine)  CHIEF COMPLAINTS/PURPOSE OF CONSULTATION:  Newly diagnosed IgG Kappa multiple myeloma.   ONCOLOGIC/HEMATOLOGIC HISTORY: 06/08/2023: Underwent CT CAP during admission for acute hypoxic respiratory failure in the setting of Influenza A and CAP vs aspiration pneumonia. Findings showed scattered bony lytic lesions in L 4th rib, pelvis including right ilium and thoracic spine.  06/09/2023: Labs collected during admission includes protein electrophoresis did not observe an M spike. UPEP 24 hour urine showed elevated free kappa light chains (611.43) and free lambda light chains (78.28) with a normal free kappa/lambda ratio.  07/13/2023: Established care with [facility] SPEP detected M protein measuring 0.3 g/dL. IFE showed IgG monoclonal protein with kappa light chain specificity. Serum free light chains showed elevated kappa light chain measuring 512.1, lambda light chain 20.3, ratio 25.23.  07/31/2023: Bone marrow biopsy revealed hypercellular bone marrow with plasma cell neoplasm representing 28% of all cells.   HISTORY OF PRESENTING ILLNESS:  David Mcgrath 73 y.o. male presents for a clinic for newly diagnosed multiple myeloma.    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  Patient reports he does not have symptoms as of 02/25/14.     Diarrhea 06/04/2023   Essential hypertension 06/04/2023   Fatty liver 06/04/2023   Flu 06/04/2023   Hyperlipidemia 06/04/2023   Ileus (HCC) 06/04/2023   Neuropathy    Paroxysmal atrial fibrillation (HCC)  07/04/2023   Pneumonia 06/04/2023    SURGICAL HISTORY: Past Surgical History:  Procedure Laterality Date   IR BONE MARROW BIOPSY & ASPIRATION  07/31/2023   NO PAST SURGERIES      SOCIAL HISTORY: Social History   Socioeconomic History   Marital status: Married    Spouse name: Nellie Banas   Number of children: 0   Years of education: Not on file   Highest education level: Not on file  Occupational History   Occupation: Event organiser  Tobacco Use   Smoking status: Never   Smokeless tobacco: Never  Substance and Sexual Activity   Alcohol  use: Yes    Comment: socialy   Drug use: No   Sexual activity: Not on file  Other Topics Concern   Not on file  Social History Narrative   Lives with spouse   Right handed   Drinks 4+ cups of caffeine daily   Retired    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[employer] investment banker Strain: Not on file  Food Insecurity: No Food Insecurity (06/04/2023)   Hunger Vital Sign    Worried About Running Out of Food in the Last Year: Never true    Ran Out of Food in the Last Year: Never true  Transportation Needs: No Transportation Needs (06/04/2023)   PRAPARE - Administrator, Civil Service (Medical): No    Lack of Transportation (Non-Medical): No  Physical Activity: Not on file  Stress: Not on file  Social Connections: Socially Integrated (06/04/2023)   Social Connection and Isolation Panel [NHANES]    Frequency of Communication with Friends and Family: More than three times a week    Frequency of  Social Gatherings with Friends and Family: Twice a week    Attends Religious Services: More than 4 times per year    Active Member of [employer] or Organizations: Yes    Attends Engineer, structural: More than 4 times per year    Marital Status: Married  Catering manager Violence: Not At Risk (06/04/2023)   Humiliation, Afraid, Rape, and Kick questionnaire    Fear of Current or Ex-Partner: No    Emotionally Abused: No    Physically Abused: No    Sexually  Abused: No    FAMILY HISTORY: Family History  Problem Relation Age of Onset   Diabetes Mother    Cancer Mother    Heart disease Mother    Cancer Father    Hyperlipidemia Father    Neuropathy Paternal Uncle    Migraines Neg Hx     ALLERGIES:  is allergic to shrimp [shellfish allergy].  MEDICATIONS:  Current Outpatient Medications  Medication Sig Dispense Refill   allopurinol  (ZYLOPRIM ) 300 MG tablet Take 300 mg by mouth daily.     apixaban  (ELIQUIS ) 5 MG TABS tablet Take 1 tablet (5 mg total) by mouth 2 (two) times daily. 60 tablet 12   Cholecalciferol (VITAMIN D-3) 125 MCG (5000 UT) TABS Take 5,000 Units by mouth daily.     Cyanocobalamin  (VITAMIN B12) 1000 MCG TBCR Take 1,000 mcg by mouth daily.     digoxin  (LANOXIN ) 0.125 MG tablet Digoxin  0.25 mg(2 tablets) daily for 1 week then decrease it to 0.125 mg (1 tablet) daily (Patient taking differently: Take 0.125 mg by mouth daily.) 97 tablet 3   ezetimibe (ZETIA) 10 MG tablet Take 10 mg by mouth every evening.     FLUoxetine  (PROZAC ) 20 MG capsule Take 20 mg by mouth daily.     fluticasone (FLONASE) 50 MCG/ACT nasal spray Place 1 spray into both nostrils daily.     gabapentin  (NEURONTIN ) 300 MG capsule Take 2 capsules (600 mg total) by mouth 2 (two) times daily. (Patient taking differently: Take 600 mg by mouth 3 (three) times daily.)     guaiFENesin  (MUCINEX ) 600 MG 12 hr tablet Take 600 mg by mouth daily.     guaifenesin  (ROBITUSSIN) 100 MG/5ML syrup Take 200 mg by mouth daily as needed for cough.     melatonin 5 MG TABS Take 5 mg by mouth at bedtime.     omeprazole (PRILOSEC) 40 MG capsule Take 40 mg by mouth daily.     pravastatin (PRAVACHOL) 80 MG tablet Take 80 mg by mouth at bedtime.     pyridoxine (B-6) 100 MG tablet Take 100 mg by mouth daily.     No current facility-administered medications for this visit.    REVIEW OF SYSTEMS:   All other systems are reviewed and are negative for acute change except as noted in the  HPI.  PHYSICAL EXAMINATION: ECOG PERFORMANCE STATUS: 1 - Symptomatic but completely ambulatory  There were no vitals filed for this visit.  There were no vitals filed for this visit.   Physical Exam Vitals reviewed.  Constitutional:      Appearance: He is not ill-appearing or toxic-appearing.  HENT:     Head: Normocephalic.     Right Ear: External ear normal.     Left Ear: External ear normal.     Mouth/Throat:     Mouth: Mucous membranes are moist.     Pharynx: Oropharynx is clear.  Eyes:     General: No scleral icterus.  Conjunctiva/sclera: Conjunctivae normal.  Cardiovascular:     Rate and Rhythm: Normal rate and regular rhythm.     Pulses: Normal pulses.     Heart sounds: Murmur heard.  Pulmonary:     Effort: Pulmonary effort is normal.     Breath sounds: Normal breath sounds.  Abdominal:     General: Bowel sounds are normal. There is no distension.     Tenderness: There is no abdominal tenderness.  Musculoskeletal:        General: Normal range of motion.     Cervical back: Normal range of motion.     Comments: No cervical, thoracic, or lumbar spinal tenderness to palpation.  No paraspinal tenderness. No step offs, crepitus or deformity palpated.  Skin:    General: Skin is warm and dry.     Capillary Refill: Capillary refill takes less than 2 seconds.  Neurological:     Comments: Sensation grossly intact to light touch in the lower extremities bilaterally. No saddle anesthesias. Strength 5/5 with flexion and extension at the bilateral hips, knees, and ankles. No noted gait deficit.        LABORATORY DATA:  I have reviewed the data as listed    Latest Ref Rng & Units 08/02/2023    9:42 AM 07/31/2023    7:48 AM 07/13/2023    1:50 PM  CBC  WBC 3.4 - 10.8 x10E3/uL 5.1  4.7  5.1   Hemoglobin 13.0 - 17.7 g/dL 40.9  81.1  91.4   Hematocrit 37.5 - 51.0 % 32.0  33.8  35.7   Platelets 150 - 450 x10E3/uL 343  345  334        Latest Ref Rng & Units 08/02/2023     9:42 AM 07/13/2023    1:50 PM 07/04/2023    3:46 PM  CMP  Glucose 70 - 99 mg/dL 782  956  213   BUN 8 - 27 mg/dL 7  7  10    Creatinine 0.76 - 1.27 mg/dL 0.86  5.78  4.69   Sodium 134 - 144 mmol/L 141  138  137   Potassium 3.5 - 5.2 mmol/L 3.6  3.9  4.2   Chloride 96 - 106 mmol/L 104  104  99   CO2 20 - 29 mmol/L 23  29  22    Calcium 8.6 - 10.2 mg/dL 8.7  9.3  9.1   Total Protein 6.5 - 8.1 g/dL  6.7    Total Bilirubin 0.0 - 1.2 mg/dL  0.5    Alkaline Phos 38 - 126 U/L  75    AST 15 - 41 U/L  37    ALT 0 - 44 U/L  30       RADIOGRAPHIC STUDIES: I have personally reviewed the radiological images as listed and agreed with the findings in the report. IR BONE MARROW BIOPSY & ASPIRATION Result Date: 07/31/2023 INDICATION: 73 year old male with a history of monoclonal gammopathy of undetermined significance. He presents for fluoroscopic guided bone marrow aspiration and core biopsy. EXAM: FLUOROSCOPIC GUIDED BONE MARROW ASPIRATION AND CORE BIOPSY Interventional Radiologist:  Roxie Cord, MD MEDICATIONS: None. ANESTHESIA/SEDATION: Moderate (conscious) sedation was employed during this procedure. A total of 1 milligrams versed  and 50 micrograms fentanyl  were administered intravenously. The patient's level of consciousness and vital signs were monitored continuously by radiology nursing throughout the procedure under my direct supervision. Total monitored sedation time: 10 minutes FLUOROSCOPY: RADIATION EXPOSURE INDEX: 2 MGY REFERENCE AIR KERMA COMPLICATIONS: None immediate. Estimated blood loss: <25  mL PROCEDURE: Informed written consent was obtained from the patient after a thorough discussion of the procedural risks, benefits and alternatives. All questions were addressed. Maximal Sterile Barrier Technique was utilized including caps, mask, sterile gowns, sterile gloves, sterile drape, hand hygiene and skin antiseptic. A timeout was performed prior to the initiation of the procedure. The  patient was positioned prone and non-contrast localization fluoroscopy was performed of the pelvis to demonstrate the iliac marrow spaces. Maximal barrier sterile technique utilized including caps, mask, sterile gowns, sterile gloves, large sterile drape, hand hygiene, and betadine prep. Under sterile conditions and local anesthesia, an 11 gauge coaxial bone biopsy needle was advanced into the left iliac marrow space. Needle position was confirmed with imaging. Initially, bone marrow aspiration was performed. Next, the 11 gauge outer cannula was utilized to obtain a left iliac bone marrow core biopsy. Needle was removed. Hemostasis was obtained with compression. The patient tolerated the procedure well. Samples were prepared with the cytotechnologist. IMPRESSION: Successful fluoroscopic guided bone marrow aspiration and core biopsy of the left iliac bone. Electronically Signed   By: Fernando Hoyer M.D.   On: 07/31/2023 09:37   ECHOCARDIOGRAM COMPLETE Result Date: 07/27/2023    ECHOCARDIOGRAM REPORT   Patient Name:   David Mcgrath Date of Exam: 07/27/2023 Medical Rec #:  [id number]               Height:       76.0 in Accession #:    [id number]              Weight:       215.9 lb Date of Birth:  28-Jan-1951               BSA:          2.288 m Patient Age:    73 years                BP:           129/67 mmHg Patient Gender: M                       HR:           70 bpm. Exam Location:  [place] Procedure: 2D Echo, Cardiac Doppler and Color Doppler (Both Spectral and Color            Flow Doppler were utilized during procedure). Indications:    Paroxysmal atrial fibrillation (HCC) [I48.0 (ICD-10-CM)  History:        Patient has no prior history of Echocardiogram examinations.                 Arrythmias:Atrial Fibrillation and Tachycardia; Risk                 Factors:Hypertension, Dyslipidemia and Non-Smoker.  Sonographer:    Lyndal Sandy RDMS, RVT, RDCS Referring Phys: [id number] RAJAN R [facility] IMPRESSIONS   1. Left ventricular ejection fraction, by estimation, is 60 to 65%. The left ventricle has normal function. The left ventricle has no regional wall motion abnormalities. Left ventricular diastolic parameters were normal.  2. Right ventricular systolic function is normal. The right ventricular size is normal.  3. The mitral valve is normal in structure. Mild mitral valve regurgitation. No evidence of mitral stenosis.  4. The aortic valve is calcified. There is moderate calcification of the aortic valve. There is moderate thickening of the aortic valve. Aortic valve regurgitation is not visualized. No aortic stenosis is  present.  5. The inferior vena cava is normal in size with greater than 50% respiratory variability, suggesting right atrial pressure of 3 mmHg. FINDINGS  Left Ventricle: Left ventricular ejection fraction, by estimation, is 60 to 65%. The left ventricle has normal function. The left ventricle has no regional wall motion abnormalities. The left ventricular internal cavity size was normal in size. There is  no left ventricular hypertrophy. Left ventricular diastolic parameters were normal. Right Ventricle: The right ventricular size is normal. No increase in right ventricular wall thickness. Right ventricular systolic function is normal. Left Atrium: Left atrial size was normal in size. Right Atrium: Right atrial size was normal in size. Pericardium: There is no evidence of pericardial effusion. Mitral Valve: The mitral valve is normal in structure. Mild mitral valve regurgitation. No evidence of mitral valve stenosis. Tricuspid Valve: The tricuspid valve is normal in structure. Tricuspid valve regurgitation is mild . No evidence of tricuspid stenosis. Aortic Valve: The aortic valve is calcified. There is moderate calcification of the aortic valve. There is moderate thickening of the aortic valve. Aortic valve regurgitation is not visualized. No aortic stenosis is present. Aortic valve mean gradient  measures 5.5 mmHg. Aortic valve peak gradient measures 9.2 mmHg. Aortic valve area, by VTI measures 2.29 cm. Pulmonic Valve: The pulmonic valve was normal in structure. Pulmonic valve regurgitation is not visualized. No evidence of pulmonic stenosis. Aorta: The aortic root is normal in size and structure. Venous: The inferior vena cava is normal in size with greater than 50% respiratory variability, suggesting right atrial pressure of 3 mmHg. IAS/Shunts: No atrial level shunt detected by color flow Doppler.  LEFT VENTRICLE PLAX 2D LVIDd:         3.20 cm      Diastology LVIDs:         2.00 cm      LV e' medial:    7.83 cm/s LV PW:         1.10 cm      LV E/e' medial:  11.1 LV IVS:        1.50 cm      LV e' lateral:   9.68 cm/s LVOT diam:     2.00 cm      LV E/e' lateral: 9.0 LV SV:         75 LV SV Index:   33 LVOT Area:     3.14 cm  LV Volumes (MOD) LV vol d, MOD A2C: 113.0 ml LV vol d, MOD A4C: 95.8 ml LV vol s, MOD A2C: 35.5 ml LV vol s, MOD A4C: 30.8 ml LV SV MOD A2C:     77.5 ml LV SV MOD A4C:     95.8 ml LV SV MOD BP:      70.1 ml RIGHT VENTRICLE RV S prime:     11.90 cm/s TAPSE (M-mode): 2.1 cm LEFT ATRIUM           Index        RIGHT ATRIUM           Index LA diam:      3.50 cm 1.53 cm/m   RA Area:     19.20 cm LA Vol (A2C): 42.9 ml 18.75 ml/m  RA Volume:   51.50 ml  22.50 ml/m LA Vol (A4C): 81.9 ml 35.79 ml/m  AORTIC VALVE AV Area (Vmax):    2.38 cm AV Area (Vmean):   2.10 cm AV Area (VTI):     2.29 cm AV Vmax:  151.50 cm/s AV Vmean:          110.500 cm/s AV VTI:            0.329 m AV Peak Grad:      9.2 mmHg AV Mean Grad:      5.5 mmHg LVOT Vmax:         115.00 cm/s LVOT Vmean:        74.000 cm/s LVOT VTI:          0.240 m LVOT/AV VTI ratio: 0.73  AORTA Ao Root diam: 4.00 cm Ao Asc diam:  3.50 cm MITRAL VALVE               TRICUSPID VALVE MV Area (PHT): 4.86 cm    TR Peak grad:   35.8 mmHg MV Decel Time: 156 msec    TR Vmax:        299.00 cm/s MR Peak grad: 51.6 mmHg MR Vmax:      359.00  cm/s  SHUNTS MV E velocity: 87.30 cm/s  Systemic VTI:  0.24 m MV A velocity: 76.60 cm/s  Systemic Diam: 2.00 cm MV E/A ratio:  1.14 Ralene Burger MD Electronically signed by Ralene Burger MD Signature Date/Time: 07/27/2023/9:25:28 PM    Final    DG Bone Survey Met Result Date: 07/20/2023 CLINICAL DATA:  Multiple myeloma.  Lytic lesions on prior CT EXAM: METASTATIC BONE SURVEY COMPARISON:  CT chest, abdomen, and pelvis dated 06/08/2023 FINDINGS: Expansile lucent lesions are again seen in the right T1 transverse process, posterior right sixth rib, and left posteromedial fourth rib. Previously noted lucencies in the pelvis and lumbar spine are not well seen. Old left posterolateral seventh rib fracture. Multilevel degenerative changes of the cervical spine, most pronounced at C4-C7, characterized by intervertebral disc space narrowing and anterior disc osteophyte formation. Grade 1 anterolisthesis at C3-4 and C7-T1. Multilevel degenerative changes of the thoracolumbar spine, bilateral hips, right-greater-than-left, and bilateral knees. IMPRESSION: Expansile lucent lesions in the right T1 transverse process, posterior right sixth rib, and left posteromedial fourth rib, as before. Previously noted lucencies in the pelvis and lumbar spine are not well seen. Otherwise, no new lucent lesions. Osseous Electronically Signed   By: Limin  Xu M.D.   On: 07/20/2023 12:49    ASSESSMENT & PLAN David Mcgrath is a 73 y.o. male presenting [facility] for consultation regarding lytic lesions. We have reviewed etiologies including metastatic disease vs lymphoproliferative disorder . Patient will proceed with laboratory workup today.   #IgG Kappa Multiple Myeloma: -Initially presented with scattered bony lytic lesions on CT CAP from 06/08/2023.  -Baseline labs from 07/13/2023 included SPEP detecting M protein measuring 0.3 g/dL. Kappa light chain elevated to 512.1, lambda light chain 20.3, ratio  25.23.  -Bone met survey from 07/20/2023 showed rxpansile lucent lesions in the right T1 transverse process, posterior right sixth rib, and left posteromedial fourth rib -Bone marrow biopsy confirmed plasma cell neoplasm measuring 28% of all cells.  -Recommend Velcade plus Dexamethasone therapy with plans to add Revlimid with Cycle 2 if tolerable.  -Tentative plan to start in one week.   #Supportive Care -- chemotherapy education to be scheduled  -- zofran  8mg  q8H PRN and compazine 10mg  PO q6H for nausea -- acyclovir 400mg  PO BID for VCZ prophylaxis -- no pain medication required at this time.    #Age related screenings -Colorectal screening UTD. -PSA from 07/13/2023 was normal at 0.4.     Patient expressed understanding of the recommended  workup and is agreeable to move forward.   All questions were answered. The patient knows to call the clinic with any problems, questions or concerns.  No orders of the defined types were placed in this encounter.   I have spent a total of 40 minutes minutes of face-to-face and non-face-to-face time, preparing to see the patient, obtaining and/or reviewing separately obtained history, performing a medically appropriate examination, counseling and educating the patient, ordering medications/tests/procedures, referring and communicating with other health care professionals, documenting clinical information in the electronic health record, independently interpreting results and communicating results to the patient, and care coordination.

## 2023-08-08 ENCOUNTER — Other Ambulatory Visit

## 2023-08-08 ENCOUNTER — Encounter (HOSPITAL_COMMUNITY): Payer: Self-pay | Admitting: Physician Assistant

## 2023-08-08 ENCOUNTER — Inpatient Hospital Stay: Admitting: Physician Assistant

## 2023-08-08 ENCOUNTER — Inpatient Hospital Stay: Attending: Physician Assistant

## 2023-08-08 ENCOUNTER — Ambulatory Visit: Admitting: Physician Assistant

## 2023-08-08 VITALS — BP 107/70 | HR 92 | Temp 97.2°F | Resp 16 | Wt 213.6 lb

## 2023-08-08 DIAGNOSIS — C9000 Multiple myeloma not having achieved remission: Secondary | ICD-10-CM | POA: Insufficient documentation

## 2023-08-08 DIAGNOSIS — M109 Gout, unspecified: Secondary | ICD-10-CM | POA: Diagnosis not present

## 2023-08-08 DIAGNOSIS — Z85828 Personal history of other malignant neoplasm of skin: Secondary | ICD-10-CM | POA: Diagnosis not present

## 2023-08-08 DIAGNOSIS — G603 Idiopathic progressive neuropathy: Secondary | ICD-10-CM | POA: Diagnosis not present

## 2023-08-08 DIAGNOSIS — Z5112 Encounter for antineoplastic immunotherapy: Secondary | ICD-10-CM | POA: Insufficient documentation

## 2023-08-08 DIAGNOSIS — J690 Pneumonitis due to inhalation of food and vomit: Secondary | ICD-10-CM | POA: Diagnosis not present

## 2023-08-08 DIAGNOSIS — F419 Anxiety disorder, unspecified: Secondary | ICD-10-CM | POA: Diagnosis not present

## 2023-08-08 DIAGNOSIS — E785 Hyperlipidemia, unspecified: Secondary | ICD-10-CM | POA: Diagnosis not present

## 2023-08-08 DIAGNOSIS — J101 Influenza due to other identified influenza virus with other respiratory manifestations: Secondary | ICD-10-CM | POA: Diagnosis not present

## 2023-08-08 DIAGNOSIS — G4733 Obstructive sleep apnea (adult) (pediatric): Secondary | ICD-10-CM | POA: Diagnosis not present

## 2023-08-08 DIAGNOSIS — M899 Disorder of bone, unspecified: Secondary | ICD-10-CM | POA: Diagnosis not present

## 2023-08-08 DIAGNOSIS — F32A Depression, unspecified: Secondary | ICD-10-CM | POA: Diagnosis not present

## 2023-08-08 DIAGNOSIS — K760 Fatty (change of) liver, not elsewhere classified: Secondary | ICD-10-CM | POA: Diagnosis not present

## 2023-08-08 DIAGNOSIS — I10 Essential (primary) hypertension: Secondary | ICD-10-CM | POA: Diagnosis not present

## 2023-08-08 DIAGNOSIS — K219 Gastro-esophageal reflux disease without esophagitis: Secondary | ICD-10-CM | POA: Diagnosis not present

## 2023-08-08 LAB — CBC WITH DIFFERENTIAL (CANCER CENTER ONLY)
Abs Immature Granulocytes: 0.03 10*3/uL (ref 0.00–0.07)
Basophils Absolute: 0 10*3/uL (ref 0.0–0.1)
Basophils Relative: 0 %
Eosinophils Absolute: 0.1 10*3/uL (ref 0.0–0.5)
Eosinophils Relative: 1 %
HCT: 32.2 % — ABNORMAL LOW (ref 39.0–52.0)
Hemoglobin: 10.9 g/dL — ABNORMAL LOW (ref 13.0–17.0)
Immature Granulocytes: 0 %
Lymphocytes Relative: 7 %
Lymphs Abs: 0.6 10*3/uL — ABNORMAL LOW (ref 0.7–4.0)
MCH: 31.9 pg (ref 26.0–34.0)
MCHC: 33.9 g/dL (ref 30.0–36.0)
MCV: 94.2 fL (ref 80.0–100.0)
Monocytes Absolute: 0.7 10*3/uL (ref 0.1–1.0)
Monocytes Relative: 8 %
Neutro Abs: 7.1 10*3/uL (ref 1.7–7.7)
Neutrophils Relative %: 84 %
Platelet Count: 352 10*3/uL (ref 150–400)
RBC: 3.42 MIL/uL — ABNORMAL LOW (ref 4.22–5.81)
RDW: 15.7 % — ABNORMAL HIGH (ref 11.5–15.5)
WBC Count: 8.6 10*3/uL (ref 4.0–10.5)
nRBC: 0 % (ref 0.0–0.2)

## 2023-08-08 LAB — CMP (CANCER CENTER ONLY)
ALT: 30 U/L (ref 0–44)
AST: 38 U/L (ref 15–41)
Albumin: 3.2 g/dL — ABNORMAL LOW (ref 3.5–5.0)
Alkaline Phosphatase: 60 U/L (ref 38–126)
Anion gap: 6 (ref 5–15)
BUN: 8 mg/dL (ref 8–23)
CO2: 27 mmol/L (ref 22–32)
Calcium: 8.3 mg/dL — ABNORMAL LOW (ref 8.9–10.3)
Chloride: 105 mmol/L (ref 98–111)
Creatinine: 0.72 mg/dL (ref 0.61–1.24)
GFR, Estimated: >60 mL/min (ref >60)
Glucose, Bld: 96 mg/dL (ref 70–99)
Potassium: 3.3 mmol/L — ABNORMAL LOW (ref 3.5–5.1)
Sodium: 138 mmol/L (ref 135–145)
Total Bilirubin: 0.5 mg/dL (ref 0.0–1.2)
Total Protein: 5.8 g/dL — ABNORMAL LOW (ref 6.5–8.1)

## 2023-08-08 MED ORDER — PROCHLORPERAZINE MALEATE 10 MG PO TABS
10.0000 mg | ORAL_TABLET | Freq: Four times a day (QID) | ORAL | 1 refills | Status: DC | PRN
Start: 2023-08-08 — End: 2023-11-13

## 2023-08-08 MED ORDER — DIPHENOXYLATE-ATROPINE 2.5-0.025 MG PO TABS: 1.0000 | ORAL_TABLET | Freq: Four times a day (QID) | ORAL | 0 refills | Status: DC | PRN

## 2023-08-08 MED ORDER — ACYCLOVIR 400 MG PO TABS: 400.0000 mg | ORAL_TABLET | Freq: Two times a day (BID) | ORAL | 3 refills | Status: DC

## 2023-08-08 MED ORDER — ONDANSETRON HCL 8 MG PO TABS: 8.0000 mg | ORAL_TABLET | Freq: Three times a day (TID) | ORAL | 1 refills | Status: DC | PRN

## 2023-08-08 NOTE — Progress Notes (Unsigned)
 [facility] Telephone:([phone]) [phone]   Fax:([phone]) [phone]  PROGRESS NOTES:  Patient Care Team: Windell Hasty, DO as PCP - General (Internal Medicine)  CHIEF COMPLAINTS/PURPOSE OF CONSULTATION:  Newly diagnosed IgG Kappa multiple myeloma.   ONCOLOGIC/HEMATOLOGIC HISTORY: 06/08/2023: Underwent CT CAP during admission for acute hypoxic respiratory failure in the setting of Influenza A and CAP vs aspiration pneumonia. Findings showed scattered bony lytic lesions in L 4th rib, pelvis including right ilium and thoracic spine.  06/09/2023: Labs collected during admission includes protein electrophoresis did not observe an M spike. UPEP 24 hour urine showed elevated free kappa light chains (611.43) and free lambda light chains (78.28) with a normal free kappa/lambda ratio.  07/13/2023: Established care with [facility] SPEP detected M protein measuring 0.3 g/dL. IFE showed IgG monoclonal protein with kappa light chain specificity. Serum free light chains showed elevated kappa light chain measuring 512.1, lambda light chain 20.3, ratio 25.23.  07/31/2023: Bone marrow biopsy revealed hypercellular bone marrow with plasma cell neoplasm representing 28% of all cells.   HISTORY OF PRESENTING ILLNESS:  David Mcgrath 73 y.o. male presents for a clinic for newly diagnosed multiple myeloma. He is accompanied by his wife for this visit.   On exam today, Mr. Bardi Mcgrath reports his energy levels are improving with physical therapy.  He reports his appetite and weight are relatively similar from his baseline.  He he has been experiencing some nausea and diarrhea over the past week.  He has tried over-the-counter interventions including Imodium with minimal improvement.  He denies easy bruising or signs of active bleeding.  He continues to have ongoing dry cough with some shortness of breath that is still lingering after recent hospitalization for pneumonia. His neuropathy is  unchanged predominantly affecting his lower legs. He denies fevers, chills, night sweats, chest pain, bone or back pain.  He has no other complaints.  MEDICAL HISTORY:  Past Medical History:  Diagnosis Date   Acute hypoxic respiratory failure (HCC) 06/04/2023   AKI (acute kidney injury) (HCC) 06/04/2023   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/04/2018   Depression 02/25/2014   Managed well with Prozac  20 mg po daily.  Patient reports he does not have symptoms as of 02/25/14.     Diarrhea 06/04/2023   Essential hypertension 06/04/2023   Fatty liver 06/04/2023   Flu 06/04/2023   Hyperlipidemia 06/04/2023   Ileus (HCC) 06/04/2023   Neuropathy    Paroxysmal atrial fibrillation (HCC) 07/04/2023   Pneumonia 06/04/2023    SURGICAL HISTORY: Past Surgical History:  Procedure Laterality Date   IR BONE MARROW BIOPSY & ASPIRATION  07/31/2023   NO PAST SURGERIES      SOCIAL HISTORY: Social History   Socioeconomic History   Marital status: Married    Spouse name: Nellie Banas   Number of children: 0   Years of education: Not on file   Highest education level: Not on file  Occupational History   Occupation: Event organiser  Tobacco Use   Smoking status: Never   Smokeless tobacco: Never  Substance and Sexual Activity   Alcohol  use: Yes    Comment: socialy   Drug use: No   Sexual activity: Not on file  Other Topics Concern   Not on file  Social History Narrative   Lives with spouse   Right handed   Drinks 4+ cups of caffeine daily   Retired    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Corporate investment banker Strain: Not on file  Food Insecurity: No Food Insecurity (06/04/2023)   Hunger Vital Sign    Worried About Running Out of Food in the Last Year: Never true    Ran Out of Food in the Last Year: Never true  Transportation Needs: No Transportation Needs (06/04/2023)   PRAPARE - Administrator, Civil Service (Medical): No    Lack of Transportation (Non-Medical): No  Physical Activity: Not  on file  Stress: Not on file  Social Connections: Socially Integrated (06/04/2023)   Social Connection and Isolation Panel [NHANES]    Frequency of Communication with Friends and Family: More than three times a week    Frequency of Social Gatherings with Friends and Family: Twice a week    Attends Religious Services: More than 4 times per year    Active Member of [employer] or Organizations: Yes    Attends Engineer, structural: More than 4 times per year    Marital Status: Married  Catering manager Violence: Not At Risk (06/04/2023)   Humiliation, Afraid, Rape, and Kick questionnaire    Fear of Current or Ex-Partner: No    Emotionally Abused: No    Physically Abused: No    Sexually Abused: No    FAMILY HISTORY: Family History  Problem Relation Age of Onset   Diabetes Mother    Cancer Mother    Heart disease Mother    Cancer Father    Hyperlipidemia Father    Neuropathy Paternal Uncle    Migraines Neg Hx     ALLERGIES:  is allergic to shrimp [shellfish allergy].  MEDICATIONS:  Current Outpatient Medications  Medication Sig Dispense Refill   allopurinol  (ZYLOPRIM ) 300 MG tablet Take 300 mg by mouth daily.     apixaban  (ELIQUIS ) 5 MG TABS tablet Take 1 tablet (5 mg total) by mouth 2 (two) times daily. 60 tablet 12   Cholecalciferol (VITAMIN D-3) 125 MCG (5000 UT) TABS Take 5,000 Units by mouth daily.     Cyanocobalamin  (VITAMIN B12) 1000 MCG TBCR Take 1,000 mcg by mouth daily.     digoxin  (LANOXIN ) 0.125 MG tablet Digoxin  0.25 mg(2 tablets) daily for 1 week then decrease it to 0.125 mg (1 tablet) daily (Patient taking differently: Take 0.125 mg by mouth daily.) 97 tablet 3   diphenoxylate-atropine (LOMOTIL) 2.5-0.025 MG tablet Take 1 tablet by mouth 4 (four) times daily as needed for diarrhea or loose stools. 30 tablet 0   ezetimibe (ZETIA) 10 MG tablet Take 10 mg by mouth every evening.     FLUoxetine  (PROZAC ) 20 MG capsule Take 20 mg by mouth daily.     fluticasone (FLONASE)  50 MCG/ACT nasal spray Place 1 spray into both nostrils daily.     gabapentin  (NEURONTIN ) 300 MG capsule Take 2 capsules (600 mg total) by mouth 2 (two) times daily. (Patient taking differently: Take 600 mg by mouth 3 (three) times daily.)     guaiFENesin  (MUCINEX ) 600 MG 12 hr tablet Take 600 mg by mouth daily.     guaifenesin  (ROBITUSSIN) 100 MG/5ML syrup Take 200 mg by mouth daily as needed for cough.     melatonin 5 MG TABS Take 5 mg by mouth at bedtime.     omeprazole (PRILOSEC) 40 MG capsule Take 40 mg by mouth daily.     pravastatin (PRAVACHOL) 80 MG tablet Take 80 mg by mouth at bedtime.     pyridoxine (B-6) 100 MG tablet Take 100 mg by mouth daily.     acyclovir (ZOVIRAX) 400 MG  tablet Take 1 tablet (400 mg total) by mouth 2 (two) times daily. 60 tablet 3   ondansetron  (ZOFRAN ) 8 MG tablet Take 1 tablet (8 mg total) by mouth every 8 (eight) hours as needed for nausea or vomiting. 30 tablet 1   prochlorperazine (COMPAZINE) 10 MG tablet Take 1 tablet (10 mg total) by mouth every 6 (six) hours as needed for nausea or vomiting. 30 tablet 1   No current facility-administered medications for this visit.    REVIEW OF SYSTEMS:   All other systems are reviewed and are negative for acute change except as noted in the HPI.  PHYSICAL EXAMINATION: ECOG PERFORMANCE STATUS: 1 - Symptomatic but completely ambulatory  Vitals:   08/08/23 1219  BP: 107/70  Pulse: 92  Resp: 16  Temp: (!) 97.2 F (36.2 C)  SpO2: 97%    Filed Weights   08/08/23 1219  Weight: 213 lb 9.6 oz (96.9 kg)     Physical Exam Vitals reviewed.  Constitutional:      Appearance: He is not ill-appearing or toxic-appearing.  HENT:     Head: Normocephalic.     Right Ear: External ear normal.     Left Ear: External ear normal.     Mouth/Throat:     Mouth: Mucous membranes are moist.     Pharynx: Oropharynx is clear.  Eyes:     General: No scleral icterus.    Conjunctiva/sclera: Conjunctivae normal.   Cardiovascular:     Rate and Rhythm: Normal rate and regular rhythm.     Pulses: Normal pulses.     Heart sounds: Murmur heard.  Pulmonary:     Effort: Pulmonary effort is normal.     Breath sounds: Normal breath sounds.  Musculoskeletal:        General: Normal range of motion.     Cervical back: Normal range of motion.  Skin:    General: Skin is warm and dry.  Neurological:     Mental Status: He is alert.  Psychiatric:        Mood and Affect: Mood normal.      LABORATORY DATA:  I have reviewed the data as listed    Latest Ref Rng & Units 08/08/2023   11:54 AM 08/02/2023    9:42 AM 07/31/2023    7:48 AM  CBC  WBC 4.0 - 10.5 K/uL 8.6  5.1  4.7   Hemoglobin 13.0 - 17.0 g/dL 29.5  28.4  13.2   Hematocrit 39.0 - 52.0 % 32.2  32.0  33.8   Platelets 150 - 400 K/uL 352  343  345        Latest Ref Rng & Units 08/08/2023   11:54 AM 08/02/2023    9:42 AM 07/13/2023    1:50 PM  CMP  Glucose 70 - 99 mg/dL 96  440  102   BUN 8 - 23 mg/dL 8  7  7    Creatinine 0.61 - 1.24 mg/dL 7.25  3.66  4.40   Sodium 135 - 145 mmol/L 138  141  138   Potassium 3.5 - 5.1 mmol/L 3.3  3.6  3.9   Chloride 98 - 111 mmol/L 105  104  104   CO2 22 - 32 mmol/L 27  23  29    Calcium 8.9 - 10.3 mg/dL 8.3  8.7  9.3   Total Protein 6.5 - 8.1 g/dL 5.8   6.7   Total Bilirubin 0.0 - 1.2 mg/dL 0.5   0.5   Alkaline Phos 38 -  126 U/L 60   75   AST 15 - 41 U/L 38   37   ALT 0 - 44 U/L 30   30      RADIOGRAPHIC STUDIES: I have personally reviewed the radiological images as listed and agreed with the findings in the report. IR BONE MARROW BIOPSY & ASPIRATION Result Date: 07/31/2023 INDICATION: 73 year old male with a history of monoclonal gammopathy of undetermined significance. He presents for fluoroscopic guided bone marrow aspiration and core biopsy. EXAM: FLUOROSCOPIC GUIDED BONE MARROW ASPIRATION AND CORE BIOPSY Interventional Radiologist:  Roxie Cord, MD MEDICATIONS: None. ANESTHESIA/SEDATION:  Moderate (conscious) sedation was employed during this procedure. A total of 1 milligrams versed  and 50 micrograms fentanyl  were administered intravenously. The patient's level of consciousness and vital signs were monitored continuously by radiology nursing throughout the procedure under my direct supervision. Total monitored sedation time: 10 minutes FLUOROSCOPY: RADIATION EXPOSURE INDEX: 2 MGY REFERENCE AIR KERMA COMPLICATIONS: None immediate. Estimated blood loss: <25 mL PROCEDURE: Informed written consent was obtained from the patient after a thorough discussion of the procedural risks, benefits and alternatives. All questions were addressed. Maximal Sterile Barrier Technique was utilized including caps, mask, sterile gowns, sterile gloves, sterile drape, hand hygiene and skin antiseptic. A timeout was performed prior to the initiation of the procedure. The patient was positioned prone and non-contrast localization fluoroscopy was performed of the pelvis to demonstrate the iliac marrow spaces. Maximal barrier sterile technique utilized including caps, mask, sterile gowns, sterile gloves, large sterile drape, hand hygiene, and betadine prep. Under sterile conditions and local anesthesia, an 11 gauge coaxial bone biopsy needle was advanced into the left iliac marrow space. Needle position was confirmed with imaging. Initially, bone marrow aspiration was performed. Next, the 11 gauge outer cannula was utilized to obtain a left iliac bone marrow core biopsy. Needle was removed. Hemostasis was obtained with compression. The patient tolerated the procedure well. Samples were prepared with the cytotechnologist. IMPRESSION: Successful fluoroscopic guided bone marrow aspiration and core biopsy of the left iliac bone. Electronically Signed   By: Fernando Hoyer M.D.   On: 07/31/2023 09:37   ECHOCARDIOGRAM COMPLETE Result Date: 07/27/2023    ECHOCARDIOGRAM REPORT   Patient Name:   Cedric Varghese Mcgrath Date of  Exam: 07/27/2023 Medical Rec #:  [id number]               Height:       76.0 in Accession #:    [id number]              Weight:       215.9 lb Date of Birth:  1951-01-17               BSA:          2.288 m Patient Age:    73 years                BP:           129/67 mmHg Patient Gender: M                       HR:           70 bpm. Exam Location:  [place] Procedure: 2D Echo, Cardiac Doppler and Color Doppler (Both Spectral and Color            Flow Doppler were utilized during procedure). Indications:    Paroxysmal atrial fibrillation (HCC) [I48.0 (ICD-10-CM)  History:  Patient has no prior history of Echocardiogram examinations.                 Arrythmias:Atrial Fibrillation and Tachycardia; Risk                 Factors:Hypertension, Dyslipidemia and Non-Smoker.  Sonographer:    Lyndal Sandy RDMS, RVT, RDCS Referring Phys: [id number] RAJAN R [facility] IMPRESSIONS  1. Left ventricular ejection fraction, by estimation, is 60 to 65%. The left ventricle has normal function. The left ventricle has no regional wall motion abnormalities. Left ventricular diastolic parameters were normal.  2. Right ventricular systolic function is normal. The right ventricular size is normal.  3. The mitral valve is normal in structure. Mild mitral valve regurgitation. No evidence of mitral stenosis.  4. The aortic valve is calcified. There is moderate calcification of the aortic valve. There is moderate thickening of the aortic valve. Aortic valve regurgitation is not visualized. No aortic stenosis is present.  5. The inferior vena cava is normal in size with greater than 50% respiratory variability, suggesting right atrial pressure of 3 mmHg. FINDINGS  Left Ventricle: Left ventricular ejection fraction, by estimation, is 60 to 65%. The left ventricle has normal function. The left ventricle has no regional wall motion abnormalities. The left ventricular internal cavity size was normal in size. There is  no left ventricular hypertrophy.  Left ventricular diastolic parameters were normal. Right Ventricle: The right ventricular size is normal. No increase in right ventricular wall thickness. Right ventricular systolic function is normal. Left Atrium: Left atrial size was normal in size. Right Atrium: Right atrial size was normal in size. Pericardium: There is no evidence of pericardial effusion. Mitral Valve: The mitral valve is normal in structure. Mild mitral valve regurgitation. No evidence of mitral valve stenosis. Tricuspid Valve: The tricuspid valve is normal in structure. Tricuspid valve regurgitation is mild . No evidence of tricuspid stenosis. Aortic Valve: The aortic valve is calcified. There is moderate calcification of the aortic valve. There is moderate thickening of the aortic valve. Aortic valve regurgitation is not visualized. No aortic stenosis is present. Aortic valve mean gradient measures 5.5 mmHg. Aortic valve peak gradient measures 9.2 mmHg. Aortic valve area, by VTI measures 2.29 cm. Pulmonic Valve: The pulmonic valve was normal in structure. Pulmonic valve regurgitation is not visualized. No evidence of pulmonic stenosis. Aorta: The aortic root is normal in size and structure. Venous: The inferior vena cava is normal in size with greater than 50% respiratory variability, suggesting right atrial pressure of 3 mmHg. IAS/Shunts: No atrial level shunt detected by color flow Doppler.  LEFT VENTRICLE PLAX 2D LVIDd:         3.20 cm      Diastology LVIDs:         2.00 cm      LV e' medial:    7.83 cm/s LV PW:         1.10 cm      LV E/e' medial:  11.1 LV IVS:        1.50 cm      LV e' lateral:   9.68 cm/s LVOT diam:     2.00 cm      LV E/e' lateral: 9.0 LV SV:         75 LV SV Index:   33 LVOT Area:     3.14 cm  LV Volumes (MOD) LV vol d, MOD A2C: 113.0 ml LV vol d, MOD A4C: 95.8 ml LV vol s, MOD  A2C: 35.5 ml LV vol s, MOD A4C: 30.8 ml LV SV MOD A2C:     77.5 ml LV SV MOD A4C:     95.8 ml LV SV MOD BP:      70.1 ml RIGHT VENTRICLE RV  S prime:     11.90 cm/s TAPSE (M-mode): 2.1 cm LEFT ATRIUM           Index        RIGHT ATRIUM           Index LA diam:      3.50 cm 1.53 cm/m   RA Area:     19.20 cm LA Vol (A2C): 42.9 ml 18.75 ml/m  RA Volume:   51.50 ml  22.50 ml/m LA Vol (A4C): 81.9 ml 35.79 ml/m  AORTIC VALVE AV Area (Vmax):    2.38 cm AV Area (Vmean):   2.10 cm AV Area (VTI):     2.29 cm AV Vmax:           151.50 cm/s AV Vmean:          110.500 cm/s AV VTI:            0.329 m AV Peak Grad:      9.2 mmHg AV Mean Grad:      5.5 mmHg LVOT Vmax:         115.00 cm/s LVOT Vmean:        74.000 cm/s LVOT VTI:          0.240 m LVOT/AV VTI ratio: 0.73  AORTA Ao Root diam: 4.00 cm Ao Asc diam:  3.50 cm MITRAL VALVE               TRICUSPID VALVE MV Area (PHT): 4.86 cm    TR Peak grad:   35.8 mmHg MV Decel Time: 156 msec    TR Vmax:        299.00 cm/s MR Peak grad: 51.6 mmHg MR Vmax:      359.00 cm/s  SHUNTS MV E velocity: 87.30 cm/s  Systemic VTI:  0.24 m MV A velocity: 76.60 cm/s  Systemic Diam: 2.00 cm MV E/A ratio:  1.14 Ralene Burger MD Electronically signed by Ralene Burger MD Signature Date/Time: 07/27/2023/9:25:28 PM    Final    DG Bone Survey Met Result Date: 07/20/2023 CLINICAL DATA:  Multiple myeloma.  Lytic lesions on prior CT EXAM: METASTATIC BONE SURVEY COMPARISON:  CT chest, abdomen, and pelvis dated 06/08/2023 FINDINGS: Expansile lucent lesions are again seen in the right T1 transverse process, posterior right sixth rib, and left posteromedial fourth rib. Previously noted lucencies in the pelvis and lumbar spine are not well seen. Old left posterolateral seventh rib fracture. Multilevel degenerative changes of the cervical spine, most pronounced at C4-C7, characterized by intervertebral disc space narrowing and anterior disc osteophyte formation. Grade 1 anterolisthesis at C3-4 and C7-T1. Multilevel degenerative changes of the thoracolumbar spine, bilateral hips, right-greater-than-left, and bilateral knees. IMPRESSION:  Expansile lucent lesions in the right T1 transverse process, posterior right sixth rib, and left posteromedial fourth rib, as before. Previously noted lucencies in the pelvis and lumbar spine are not well seen. Otherwise, no new lucent lesions. Osseous Electronically Signed   By: Limin  Xu M.D.   On: 07/20/2023 12:49    ASSESSMENT & PLAN Jaxon Thoresen Mcgrath is a 72 y.o. male presenting [facility] for consultation regarding lytic lesions. We have reviewed etiologies including metastatic disease vs lymphoproliferative disorder . Patient will proceed  with laboratory workup today.   #IgG Kappa Multiple Myeloma: -Initially presented with scattered bony lytic lesions on CT CAP from 06/08/2023.  -Baseline labs from 07/13/2023 included SPEP detecting M protein measuring 0.3 g/dL. Kappa light chain elevated to 512.1, lambda light chain 20.3, ratio 25.23.  -Bone met survey from 07/20/2023 showed rxpansile lucent lesions in the right T1 transverse process, posterior right sixth rib, and left posteromedial fourth rib -Bone marrow biopsy confirmed plasma cell neoplasm measuring 28% of all cells.  -Recommend Velcade plus Dexamethasone therapy with plans to add Revlimid with Cycle 2 if tolerable.  -Tentative plan to start in one week.   #Peripheral neuropathy: --Evaluated by neurology before MM diagnosis.  --EMG nerve conduction study done on 01/30/2020 by Dr. Salli Crawley confirms severe axonal sensorimotor polyneuropathy as well as moderate right carpal tunnel.  --Currently on gabapentin  600 mg TID.  --Monitor closely with initiation of velcade as neuropathy can worsen.   #Supportive Care -- chemotherapy education to be scheduled  -- zofran  8mg  q8H PRN and compazine 10mg  PO q6H for nausea -- acyclovir 400mg  PO BID for VCZ prophylaxis -- no pain medication required at this time.   #Age related screenings -Colorectal screening UTD. -PSA from 07/13/2023 was normal at 0.4.    Patient  expressed understanding of the recommended workup and is agreeable to move forward.   All questions were answered. The patient knows to call the clinic with any problems, questions or concerns.  Orders Placed This Encounter  Procedures   CBC with Differential (Cancer Center Only)    Standing Status:   Future    Expected Date:   09/13/2023    Expiration Date:   09/12/2024   CMP (Cancer Center only)    Standing Status:   Future    Expected Date:   09/13/2023    Expiration Date:   09/12/2024   CBC with Differential (Cancer Center Only)    Standing Status:   Future    Expected Date:   09/20/2023    Expiration Date:   09/19/2024   CMP (Cancer Center only)    Standing Status:   Future    Expected Date:   09/20/2023    Expiration Date:   09/19/2024   CBC with Differential (Cancer Center Only)    Standing Status:   Future    Expected Date:   09/06/2023    Expiration Date:   09/05/2024   CMP (Cancer Center only)    Standing Status:   Future    Expected Date:   09/06/2023    Expiration Date:   09/05/2024   CBC with Differential (Cancer Center Only)    Standing Status:   Future    Expected Date:   10/04/2023    Expiration Date:   10/03/2024   CMP (Cancer Center only)    Standing Status:   Future    Expected Date:   10/04/2023    Expiration Date:   10/03/2024   CBC with Differential (Cancer Center Only)    Standing Status:   Future    Expected Date:   10/11/2023    Expiration Date:   10/10/2024   CMP (Cancer Center only)    Standing Status:   Future    Expected Date:   10/11/2023    Expiration Date:   10/10/2024   CBC with Differential (Cancer Center Only)    Standing Status:   Future    Expected Date:   09/27/2023    Expiration Date:   09/26/2024  CMP (Cancer Center only)    Standing Status:   Future    Expected Date:   09/27/2023    Expiration Date:   09/26/2024   CBC with Differential (Cancer Center Only)    Standing Status:   Future    Expected Date:   10/25/2023    Expiration Date:   10/24/2024   CMP  (Cancer Center only)    Standing Status:   Future    Expected Date:   10/25/2023    Expiration Date:   10/24/2024   CBC with Differential (Cancer Center Only)    Standing Status:   Future    Expected Date:   11/01/2023    Expiration Date:   10/31/2024   CMP (Cancer Center only)    Standing Status:   Future    Expected Date:   11/01/2023    Expiration Date:   10/31/2024   CBC with Differential (Cancer Center Only)    Standing Status:   Future    Expected Date:   10/18/2023    Expiration Date:   10/17/2024   CMP (Cancer Center only)    Standing Status:   Future    Expected Date:   10/18/2023    Expiration Date:   10/17/2024    I have spent a total of 30 minutes minutes of face-to-face and non-face-to-face time, preparing to see the patient, obtaining and/or reviewing separately obtained history, performing a medically appropriate examination, counseling and educating the patient, ordering medications/tests/procedures, referring and communicating with other health care professionals, documenting clinical information in the electronic health record, independently interpreting results and communicating results to the patient, and care coordination.   Wyline Hearing PA-C Dept of Hematology and Oncology [facility] Phone: [phone]

## 2023-08-09 ENCOUNTER — Encounter (HOSPITAL_COMMUNITY): Payer: Self-pay | Admitting: Physician Assistant

## 2023-08-09 ENCOUNTER — Encounter: Payer: Self-pay | Admitting: Hematology and Oncology

## 2023-08-09 ENCOUNTER — Telehealth: Payer: Self-pay | Admitting: Hematology and Oncology

## 2023-08-09 MED ORDER — DEXAMETHASONE 4 MG PO TABS: ORAL_TABLET | ORAL | 0 refills | Status: DC

## 2023-08-09 NOTE — Progress Notes (Signed)
 Pt called for pre procedure instructions. Arrival time 0745 NPO after midnight explained Instructed to take am meds with sip of water and confirmed blood thinner consistency Instructed pt need for ride home tomorrow and have responsible adult with them for 24 hrs post procedure.

## 2023-08-09 NOTE — Anesthesia Preprocedure Evaluation (Signed)
 Anesthesia Evaluation    Reviewed: Allergy & Precautions, Patient's Chart, lab work & pertinent test results, Unable to perform ROS - Chart review only  Airway        Dental   Pulmonary neg pulmonary ROS          Cardiovascular hypertension, + Valvular Problems/Murmurs MR   Echo  1. Left ventricular ejection fraction, by estimation, is 60 to 65%. The left ventricle has normal function. The left ventricle has no regional wall motion abnormalities. Left ventricular diastolic parameters were normal.   2. Right ventricular systolic function is normal. The right ventricular size is normal.   3. The mitral valve is normal in structure. Mild mitral valve regurgitation. No evidence of mitral stenosis.   4. The aortic valve is calcified. There is moderate calcification of the aortic valve. There is moderate thickening of the aortic valve. Aortic valve regurgitation is not visualized. No aortic stenosis is present.   5. The inferior vena cava is normal in size with greater than 50% respiratory variability, suggesting right atrial pressure of 3 mmHg.     Neuro/Psych  PSYCHIATRIC DISORDERS  Depression    negative neurological ROS     GI/Hepatic negative GI ROS, Neg liver ROS,,,  Endo/Other  negative endocrine ROS    Renal/GU Renal disease     Musculoskeletal negative musculoskeletal ROS (+)    Abdominal   Peds  Hematology negative hematology ROS (+)   Anesthesia Other Findings   Reproductive/Obstetrics                              Anesthesia Physical Anesthesia Plan  ASA: 3  Anesthesia Plan: General   Post-op Pain Management: Minimal or no pain anticipated   Induction:   PONV Risk Score and Plan: 2 and Treatment may vary due to age or medical condition, Propofol infusion and TIVA  Airway Management Planned: Natural Airway  Additional Equipment:   Intra-op Plan:   Post-operative Plan:    Informed Consent:   Plan Discussed with:   Anesthesia Plan Comments:          Anesthesia Quick Evaluation

## 2023-08-10 ENCOUNTER — Encounter (HOSPITAL_COMMUNITY)
Admission: RE | Disposition: A | Discharge status: Home / Self Care | Payer: Self-pay | Source: Home / Self Care | Attending: Cardiology

## 2023-08-10 ENCOUNTER — Encounter (HOSPITAL_COMMUNITY): Payer: Self-pay

## 2023-08-10 ENCOUNTER — Ambulatory Visit (HOSPITAL_COMMUNITY)
Admission: RE | Admit: 2023-08-10 | Discharge: 2023-08-10 | Disposition: A | Discharge status: Home / Self Care | Attending: Cardiology | Admitting: Cardiology

## 2023-08-10 DIAGNOSIS — I10 Essential (primary) hypertension: Secondary | ICD-10-CM | POA: Diagnosis not present

## 2023-08-10 DIAGNOSIS — E782 Mixed hyperlipidemia: Secondary | ICD-10-CM | POA: Diagnosis not present

## 2023-08-10 DIAGNOSIS — I480 Paroxysmal atrial fibrillation: Secondary | ICD-10-CM | POA: Insufficient documentation

## 2023-08-10 DIAGNOSIS — Z539 Procedure and treatment not carried out, unspecified reason: Secondary | ICD-10-CM | POA: Insufficient documentation

## 2023-08-10 DIAGNOSIS — Z79899 Other long term (current) drug therapy: Secondary | ICD-10-CM | POA: Insufficient documentation

## 2023-08-10 SURGERY — CARDIOVERSION (CATH LAB)
Anesthesia: Monitor Anesthesia Care

## 2023-08-10 NOTE — Interval H&P Note (Signed)
 History and Physical Interval Note:  08/10/2023 7:48 AM  David Mcgrath  has presented today for surgery, with the diagnosis of AFIB.  The various methods of treatment have been discussed with the patient and family. After consideration of risks, benefits and other options for treatment, the patient has consented to  Procedure(s): CARDIOVERSION (N/A) as a surgical intervention.  The patient's history has been reviewed, patient examined, no change in status, stable for surgery.  I have reviewed the patient's chart and labs.  Questions were answered to the patient's satisfaction.     [name]

## 2023-08-11 ENCOUNTER — Telehealth: Payer: Self-pay | Admitting: Cardiology

## 2023-08-11 NOTE — Telephone Encounter (Signed)
  Pt is requesting to switch from Dr. Lafayette Pierre to Dr. Berry Bristol. They are closer to go to [employer] office

## 2023-08-11 NOTE — Telephone Encounter (Signed)
I am okay with this

## 2023-08-15 ENCOUNTER — Inpatient Hospital Stay

## 2023-08-15 NOTE — Telephone Encounter (Signed)
  LVM to schedule with Dr. Berry Bristol. Provide switch approved

## 2023-08-16 ENCOUNTER — Telehealth: Payer: Self-pay | Admitting: *Deleted

## 2023-08-16 ENCOUNTER — Other Ambulatory Visit: Payer: Self-pay | Admitting: Physician Assistant

## 2023-08-16 ENCOUNTER — Encounter: Payer: Self-pay | Admitting: Hematology and Oncology

## 2023-08-16 DIAGNOSIS — F32A Depression, unspecified: Secondary | ICD-10-CM | POA: Diagnosis not present

## 2023-08-16 DIAGNOSIS — M109 Gout, unspecified: Secondary | ICD-10-CM | POA: Diagnosis not present

## 2023-08-16 DIAGNOSIS — E785 Hyperlipidemia, unspecified: Secondary | ICD-10-CM | POA: Diagnosis not present

## 2023-08-16 DIAGNOSIS — G603 Idiopathic progressive neuropathy: Secondary | ICD-10-CM | POA: Diagnosis not present

## 2023-08-16 DIAGNOSIS — C9000 Multiple myeloma not having achieved remission: Secondary | ICD-10-CM

## 2023-08-16 DIAGNOSIS — J690 Pneumonitis due to inhalation of food and vomit: Secondary | ICD-10-CM | POA: Diagnosis not present

## 2023-08-16 DIAGNOSIS — F419 Anxiety disorder, unspecified: Secondary | ICD-10-CM | POA: Diagnosis not present

## 2023-08-16 DIAGNOSIS — K219 Gastro-esophageal reflux disease without esophagitis: Secondary | ICD-10-CM | POA: Diagnosis not present

## 2023-08-16 DIAGNOSIS — I10 Essential (primary) hypertension: Secondary | ICD-10-CM | POA: Diagnosis not present

## 2023-08-16 DIAGNOSIS — Z85828 Personal history of other malignant neoplasm of skin: Secondary | ICD-10-CM | POA: Diagnosis not present

## 2023-08-16 DIAGNOSIS — K760 Fatty (change of) liver, not elsewhere classified: Secondary | ICD-10-CM | POA: Diagnosis not present

## 2023-08-16 DIAGNOSIS — J101 Influenza due to other identified influenza virus with other respiratory manifestations: Secondary | ICD-10-CM | POA: Diagnosis not present

## 2023-08-16 DIAGNOSIS — M899 Disorder of bone, unspecified: Secondary | ICD-10-CM | POA: Diagnosis not present

## 2023-08-16 DIAGNOSIS — G4733 Obstructive sleep apnea (adult) (pediatric): Secondary | ICD-10-CM | POA: Diagnosis not present

## 2023-08-16 NOTE — Telephone Encounter (Signed)
 Received vm message from pt's wife. Asking for a call back. TCT pt's wife, David Mcgrath and spoke with her. She states that David Mcgrath had an episode of chills last night and some shaking. Denies fever.He did have some diarrhea yesterday that has happened off and on. He also started the Acyclovir  yesterday in preparation for beginning his treatment for Multiple Myeloma. She is asking if it was the Acyclovir . Advised that likely it is not. It may be related to his diarrhea that he has had prior to the Acyclovir . Encouraged fluids, continue lomotil  as directed for the diarrhea. She wanted to make sure he was ok to start his treatment tomorrow. Advised that it would be ok to start as planned. Advised we will get VS and labs and make sure he is feeling ok prior to treatment. Wife voiced understanding.

## 2023-08-16 NOTE — Telephone Encounter (Signed)
 Message received from [facility] nurse - Floreen Hunger - wanting to give report of status "I think his nurse is Darrol Emmer?"  She states pt is fatigued, short of breath and shaky.  Overall vitals are stable including O2 status.  She gave return call number for her as [phone]  Post call - reviewed chart -pt is under the care of Dr Rosaline Coma.  This note will be forwarded.

## 2023-08-17 ENCOUNTER — Inpatient Hospital Stay

## 2023-08-17 ENCOUNTER — Other Ambulatory Visit: Payer: Self-pay

## 2023-08-17 VITALS — BP 122/64 | HR 97 | Resp 18 | Wt 224.0 lb

## 2023-08-17 DIAGNOSIS — Z5112 Encounter for antineoplastic immunotherapy: Secondary | ICD-10-CM | POA: Diagnosis not present

## 2023-08-17 DIAGNOSIS — C9000 Multiple myeloma not having achieved remission: Secondary | ICD-10-CM | POA: Diagnosis not present

## 2023-08-17 LAB — CMP (CANCER CENTER ONLY)
ALT: 37 U/L (ref 0–44)
AST: 51 U/L — ABNORMAL HIGH (ref 15–41)
Albumin: 3.2 g/dL — ABNORMAL LOW (ref 3.5–5.0)
Alkaline Phosphatase: 56 U/L (ref 38–126)
Anion gap: 8 (ref 5–15)
BUN: 7 mg/dL — ABNORMAL LOW (ref 8–23)
CO2: 25 mmol/L (ref 22–32)
Calcium: 8.1 mg/dL — ABNORMAL LOW (ref 8.9–10.3)
Chloride: 102 mmol/L (ref 98–111)
Creatinine: 0.75 mg/dL (ref 0.61–1.24)
GFR, Estimated: >60 mL/min (ref >60)
Glucose, Bld: 222 mg/dL — ABNORMAL HIGH (ref 70–99)
Potassium: 3.4 mmol/L — ABNORMAL LOW (ref 3.5–5.1)
Sodium: 135 mmol/L (ref 135–145)
Total Bilirubin: 0.4 mg/dL (ref 0.0–1.2)
Total Protein: 5.7 g/dL — ABNORMAL LOW (ref 6.5–8.1)

## 2023-08-17 LAB — CBC WITH DIFFERENTIAL (CANCER CENTER ONLY)
Abs Immature Granulocytes: 0.06 10*3/uL (ref 0.00–0.07)
Basophils Absolute: 0 10*3/uL (ref 0.0–0.1)
Basophils Relative: 0 %
Eosinophils Absolute: 0 10*3/uL (ref 0.0–0.5)
Eosinophils Relative: 0 %
HCT: 33 % — ABNORMAL LOW (ref 39.0–52.0)
Hemoglobin: 11.5 g/dL — ABNORMAL LOW (ref 13.0–17.0)
Immature Granulocytes: 0 %
Lymphocytes Relative: 2 %
Lymphs Abs: 0.3 10*3/uL — ABNORMAL LOW (ref 0.7–4.0)
MCH: 31.9 pg (ref 26.0–34.0)
MCHC: 34.8 g/dL (ref 30.0–36.0)
MCV: 91.7 fL (ref 80.0–100.0)
Monocytes Absolute: 0.1 10*3/uL (ref 0.1–1.0)
Monocytes Relative: 1 %
Neutro Abs: 14.7 10*3/uL — ABNORMAL HIGH (ref 1.7–7.7)
Neutrophils Relative %: 97 %
Platelet Count: 436 10*3/uL — ABNORMAL HIGH (ref 150–400)
RBC: 3.6 MIL/uL — ABNORMAL LOW (ref 4.22–5.81)
RDW: 15 % (ref 11.5–15.5)
WBC Count: 15.2 10*3/uL — ABNORMAL HIGH (ref 4.0–10.5)
nRBC: 0 % (ref 0.0–0.2)

## 2023-08-17 MED ORDER — BORTEZOMIB CHEMO SQ INJECTION 3.5 MG (2.5MG/ML)
1.3000 mg/m2 | Freq: Once | INTRAMUSCULAR | Status: AC
  Administered 2023-08-17: 3 mg via SUBCUTANEOUS
  Filled 2023-08-17: qty 1.2

## 2023-08-17 NOTE — Patient Instructions (Signed)
 [facility] - A DEPT OF [facility]   Discharge Instructions: Thank you for choosing [facility] to provide your oncology and hematology care.   If you have a lab appointment with the Cancer Center, please go directly to the Cancer Center and check in at the registration area.   Wear comfortable clothing and clothing appropriate for easy access to any Portacath or PICC line.   We strive to give you quality time with your provider. You may need to reschedule your appointment if you arrive late (15 or more minutes).  Arriving late affects you and other patients whose appointments are after yours.  Also, if you miss three or more appointments without notifying the office, you may be dismissed from the clinic at the provider's discretion.      For prescription refill requests, have your pharmacy contact our office and allow 72 hours for refills to be completed.    Today you received the following chemotherapy and/or immunotherapy agents: Bortezomib (Velcade)      To help prevent nausea and vomiting after your treatment, we encourage you to take your nausea medication as directed.  BELOW ARE SYMPTOMS THAT SHOULD BE REPORTED IMMEDIATELY: *FEVER GREATER THAN 100.4 F (38 C) OR HIGHER *CHILLS OR SWEATING *NAUSEA AND VOMITING THAT IS NOT CONTROLLED WITH YOUR NAUSEA MEDICATION *UNUSUAL SHORTNESS OF BREATH *UNUSUAL BRUISING OR BLEEDING *URINARY PROBLEMS (pain or burning when urinating, or frequent urination) *BOWEL PROBLEMS (unusual diarrhea, constipation, pain near the anus) TENDERNESS IN MOUTH AND THROAT WITH OR WITHOUT PRESENCE OF ULCERS (sore throat, sores in mouth, or a toothache) UNUSUAL RASH, SWELLING OR PAIN  UNUSUAL VAGINAL DISCHARGE OR ITCHING   Items with * indicate a potential emergency and should be followed up as soon as possible or go to the Emergency Department if any problems should occur.  Please show the CHEMOTHERAPY ALERT CARD or  IMMUNOTHERAPY ALERT CARD at check-in to the Emergency Department and triage nurse.  Should you have questions after your visit or need to cancel or reschedule your appointment, please contact [facility] - A DEPT OF [facility]  Dept: [phone]  and follow the prompts.  Office hours are 8:00 a.m. to 4:30 p.m. Monday - Friday. Please note that voicemails left after 4:00 p.m. may not be returned until the following business day.  We are closed weekends and major holidays. You have access to a nurse at all times for urgent questions. Please call the main number to the clinic Dept: [phone] and follow the prompts.   For any non-urgent questions, you may also contact your provider using MyChart. We now offer e-Visits for anyone 75 and older to request care online for non-urgent symptoms. For details visit [email or web address].   Also download the MyChart app! Go to the app store, search "MyChart", open the app, select , and log in with your MyChart username and password.

## 2023-08-18 DIAGNOSIS — F419 Anxiety disorder, unspecified: Secondary | ICD-10-CM | POA: Diagnosis not present

## 2023-08-18 DIAGNOSIS — K219 Gastro-esophageal reflux disease without esophagitis: Secondary | ICD-10-CM | POA: Diagnosis not present

## 2023-08-18 DIAGNOSIS — I10 Essential (primary) hypertension: Secondary | ICD-10-CM | POA: Diagnosis not present

## 2023-08-18 DIAGNOSIS — K760 Fatty (change of) liver, not elsewhere classified: Secondary | ICD-10-CM | POA: Diagnosis not present

## 2023-08-18 DIAGNOSIS — M109 Gout, unspecified: Secondary | ICD-10-CM | POA: Diagnosis not present

## 2023-08-18 DIAGNOSIS — M899 Disorder of bone, unspecified: Secondary | ICD-10-CM | POA: Diagnosis not present

## 2023-08-18 DIAGNOSIS — G4733 Obstructive sleep apnea (adult) (pediatric): Secondary | ICD-10-CM | POA: Diagnosis not present

## 2023-08-18 DIAGNOSIS — E785 Hyperlipidemia, unspecified: Secondary | ICD-10-CM | POA: Diagnosis not present

## 2023-08-18 DIAGNOSIS — J101 Influenza due to other identified influenza virus with other respiratory manifestations: Secondary | ICD-10-CM | POA: Diagnosis not present

## 2023-08-18 DIAGNOSIS — G603 Idiopathic progressive neuropathy: Secondary | ICD-10-CM | POA: Diagnosis not present

## 2023-08-18 DIAGNOSIS — Z85828 Personal history of other malignant neoplasm of skin: Secondary | ICD-10-CM | POA: Diagnosis not present

## 2023-08-18 DIAGNOSIS — J690 Pneumonitis due to inhalation of food and vomit: Secondary | ICD-10-CM | POA: Diagnosis not present

## 2023-08-18 DIAGNOSIS — F32A Depression, unspecified: Secondary | ICD-10-CM | POA: Diagnosis not present

## 2023-08-18 LAB — KAPPA/LAMBDA LIGHT CHAINS
Kappa free light chain: 347.1 mg/L — ABNORMAL HIGH (ref 3.3–19.4)
Kappa, lambda light chain ratio: 15.57 — ABNORMAL HIGH (ref 0.26–1.65)
Lambda free light chains: 22.3 mg/L (ref 5.7–26.3)

## 2023-08-22 DIAGNOSIS — J101 Influenza due to other identified influenza virus with other respiratory manifestations: Secondary | ICD-10-CM | POA: Diagnosis not present

## 2023-08-22 DIAGNOSIS — J690 Pneumonitis due to inhalation of food and vomit: Secondary | ICD-10-CM | POA: Diagnosis not present

## 2023-08-22 DIAGNOSIS — K219 Gastro-esophageal reflux disease without esophagitis: Secondary | ICD-10-CM | POA: Diagnosis not present

## 2023-08-22 DIAGNOSIS — K760 Fatty (change of) liver, not elsewhere classified: Secondary | ICD-10-CM | POA: Diagnosis not present

## 2023-08-22 DIAGNOSIS — E785 Hyperlipidemia, unspecified: Secondary | ICD-10-CM | POA: Diagnosis not present

## 2023-08-22 DIAGNOSIS — M899 Disorder of bone, unspecified: Secondary | ICD-10-CM | POA: Diagnosis not present

## 2023-08-22 DIAGNOSIS — F419 Anxiety disorder, unspecified: Secondary | ICD-10-CM | POA: Diagnosis not present

## 2023-08-22 DIAGNOSIS — Z85828 Personal history of other malignant neoplasm of skin: Secondary | ICD-10-CM | POA: Diagnosis not present

## 2023-08-22 DIAGNOSIS — M109 Gout, unspecified: Secondary | ICD-10-CM | POA: Diagnosis not present

## 2023-08-22 DIAGNOSIS — F32A Depression, unspecified: Secondary | ICD-10-CM | POA: Diagnosis not present

## 2023-08-22 DIAGNOSIS — G603 Idiopathic progressive neuropathy: Secondary | ICD-10-CM | POA: Diagnosis not present

## 2023-08-22 DIAGNOSIS — I10 Essential (primary) hypertension: Secondary | ICD-10-CM | POA: Diagnosis not present

## 2023-08-22 DIAGNOSIS — G4733 Obstructive sleep apnea (adult) (pediatric): Secondary | ICD-10-CM | POA: Diagnosis not present

## 2023-08-22 LAB — MULTIPLE MYELOMA PANEL, SERUM
Albumin SerPl Elph-Mcnc: 2.4 g/dL — ABNORMAL LOW (ref 2.9–4.4)
Albumin/Glob SerPl: 0.9 (ref 0.7–1.7)
Alpha 1: 0.4 g/dL (ref 0.0–0.4)
Alpha2 Glob SerPl Elph-Mcnc: 0.9 g/dL (ref 0.4–1.0)
B-Globulin SerPl Elph-Mcnc: 0.6 g/dL — ABNORMAL LOW (ref 0.7–1.3)
Gamma Glob SerPl Elph-Mcnc: 0.9 g/dL (ref 0.4–1.8)
Globulin, Total: 2.8 g/dL (ref 2.2–3.9)
IgA: 86 mg/dL (ref 61–437)
IgG (Immunoglobin G), Serum: 777 mg/dL (ref 603–1613)
IgM (Immunoglobulin M), Srm: 54 mg/dL (ref 15–143)
Total Protein ELP: 5.2 g/dL — ABNORMAL LOW (ref 6.0–8.5)

## 2023-08-23 ENCOUNTER — Inpatient Hospital Stay (HOSPITAL_BASED_OUTPATIENT_CLINIC_OR_DEPARTMENT_OTHER): Admitting: Physician Assistant

## 2023-08-23 ENCOUNTER — Inpatient Hospital Stay

## 2023-08-23 ENCOUNTER — Encounter: Payer: Self-pay | Admitting: Medical Oncology

## 2023-08-23 VITALS — BP 122/62 | HR 95 | Temp 97.6°F | Resp 16 | Wt 212.2 lb

## 2023-08-23 DIAGNOSIS — C9000 Multiple myeloma not having achieved remission: Secondary | ICD-10-CM | POA: Diagnosis not present

## 2023-08-23 DIAGNOSIS — Z5112 Encounter for antineoplastic immunotherapy: Secondary | ICD-10-CM | POA: Diagnosis not present

## 2023-08-23 LAB — CBC WITH DIFFERENTIAL (CANCER CENTER ONLY)
Abs Immature Granulocytes: 0.06 10*3/uL (ref 0.00–0.07)
Basophils Absolute: 0 10*3/uL (ref 0.0–0.1)
Basophils Relative: 0 %
Eosinophils Absolute: 0.1 10*3/uL (ref 0.0–0.5)
Eosinophils Relative: 1 %
HCT: 33.4 % — ABNORMAL LOW (ref 39.0–52.0)
Hemoglobin: 11.4 g/dL — ABNORMAL LOW (ref 13.0–17.0)
Immature Granulocytes: 1 %
Lymphocytes Relative: 5 %
Lymphs Abs: 0.5 10*3/uL — ABNORMAL LOW (ref 0.7–4.0)
MCH: 32 pg (ref 26.0–34.0)
MCHC: 34.1 g/dL (ref 30.0–36.0)
MCV: 93.8 fL (ref 80.0–100.0)
Monocytes Absolute: 0.5 10*3/uL (ref 0.1–1.0)
Monocytes Relative: 5 %
Neutro Abs: 9.7 10*3/uL — ABNORMAL HIGH (ref 1.7–7.7)
Neutrophils Relative %: 88 %
Platelet Count: 375 10*3/uL (ref 150–400)
RBC: 3.56 MIL/uL — ABNORMAL LOW (ref 4.22–5.81)
RDW: 15.2 % (ref 11.5–15.5)
WBC Count: 10.9 10*3/uL — ABNORMAL HIGH (ref 4.0–10.5)
nRBC: 0 % (ref 0.0–0.2)

## 2023-08-23 LAB — CMP (CANCER CENTER ONLY)
ALT: 42 U/L (ref 0–44)
AST: 48 U/L — ABNORMAL HIGH (ref 15–41)
Albumin: 3.2 g/dL — ABNORMAL LOW (ref 3.5–5.0)
Alkaline Phosphatase: 52 U/L (ref 38–126)
Anion gap: 6 (ref 5–15)
BUN: 7 mg/dL — ABNORMAL LOW (ref 8–23)
CO2: 29 mmol/L (ref 22–32)
Calcium: 8.3 mg/dL — ABNORMAL LOW (ref 8.9–10.3)
Chloride: 103 mmol/L (ref 98–111)
Creatinine: 0.75 mg/dL (ref 0.61–1.24)
GFR, Estimated: >60 mL/min (ref >60)
Glucose, Bld: 146 mg/dL — ABNORMAL HIGH (ref 70–99)
Potassium: 3.5 mmol/L (ref 3.5–5.1)
Sodium: 138 mmol/L (ref 135–145)
Total Bilirubin: 0.4 mg/dL (ref 0.0–1.2)
Total Protein: 5.6 g/dL — ABNORMAL LOW (ref 6.5–8.1)

## 2023-08-23 MED ORDER — DIPHENOXYLATE-ATROPINE 2.5-0.025 MG PO TABS: 1.0000 | ORAL_TABLET | Freq: Four times a day (QID) | ORAL | 0 refills | Status: DC | PRN

## 2023-08-23 MED ORDER — BORTEZOMIB CHEMO SQ INJECTION 3.5 MG (2.5MG/ML)
1.3000 mg/m2 | Freq: Once | INTRAMUSCULAR | Status: AC
  Administered 2023-08-23: 3 mg via SUBCUTANEOUS
  Filled 2023-08-23: qty 1.2

## 2023-08-23 NOTE — Patient Instructions (Signed)
 [facility]  Discharge Instructions: Thank you for choosing [facility] to provide your oncology and hematology care.   If you have a lab appointment with the Cancer Center, please go directly to the Cancer Center and check in at the registration area.   Wear comfortable clothing and clothing appropriate for easy access to any Portacath or PICC line.   We strive to give you quality time with your provider. You may need to reschedule your appointment if you arrive late (15 or more minutes).  Arriving late affects you and other patients whose appointments are after yours.  Also, if you miss three or more appointments without notifying the office, you may be dismissed from the clinic at the provider's discretion.      For prescription refill requests, have your pharmacy contact our office and allow 72 hours for refills to be completed.    Today you received the following chemotherapy and/or immunotherapy agents: Velcade    To help prevent nausea and vomiting after your treatment, we encourage you to take your nausea medication as directed.  BELOW ARE SYMPTOMS THAT SHOULD BE REPORTED IMMEDIATELY: *FEVER GREATER THAN 100.4 F (38 C) OR HIGHER *CHILLS OR SWEATING *NAUSEA AND VOMITING THAT IS NOT CONTROLLED WITH YOUR NAUSEA MEDICATION *UNUSUAL SHORTNESS OF BREATH *UNUSUAL BRUISING OR BLEEDING *URINARY PROBLEMS (pain or burning when urinating, or frequent urination) *BOWEL PROBLEMS (unusual diarrhea, constipation, pain near the anus) TENDERNESS IN MOUTH AND THROAT WITH OR WITHOUT PRESENCE OF ULCERS (sore throat, sores in mouth, or a toothache) UNUSUAL RASH, SWELLING OR PAIN  UNUSUAL VAGINAL DISCHARGE OR ITCHING   Items with * indicate a potential emergency and should be followed up as soon as possible or go to the Emergency Department if any problems should occur.  Please show the CHEMOTHERAPY ALERT CARD or IMMUNOTHERAPY  ALERT CARD at check-in to the Emergency Department and triage nurse.  Should you have questions after your visit or need to cancel or reschedule your appointment, please contact [facility] - [facility]  Dept: [phone]  and follow the prompts.  Office hours are 8:00 a.m. to 4:30 p.m. Monday - Friday. Please note that voicemails left after 4:00 p.m. may not be returned until the following business day.  We are closed weekends and major holidays. You have access to a nurse at all times for urgent questions. Please call the main number to the clinic Dept: [phone] and follow the prompts.   For any non-urgent questions, you may also contact your provider using MyChart. We now offer e-Visits for anyone 69 and older to request care online for non-urgent symptoms. For details visit [email or web address].   Also download the MyChart app! Go to the app store, search "MyChart", open the app, select , and log in with your MyChart username and password.

## 2023-08-23 NOTE — Progress Notes (Signed)
 [facility] [phone]   [phone]  PROGRESS NOTES:  Patient Care Team: Windell Hasty, DO as PCP - General (Internal Medicine)  CHIEF COMPLAINTS/PURPOSE OF CONSULTATION:   IgG Kappa multiple myeloma.   ONCOLOGIC/HEMATOLOGIC HISTORY: 06/08/2023: Underwent CT CAP during admission for acute hypoxic respiratory failure in the setting of Influenza A and CAP vs aspiration pneumonia. Findings showed scattered bony lytic lesions in L 4th rib, pelvis including right ilium and thoracic spine.  06/09/2023: Labs collected during admission includes protein electrophoresis did not observe an M spike. UPEP 24 hour urine showed elevated free kappa light chains (611.43) and free lambda light chains (78.28) with a normal free kappa/lambda ratio.  07/13/2023: Established care with [facility] SPEP detected M protein measuring 0.3 g/dL. IFE showed IgG monoclonal protein with kappa light chain specificity. Serum free light chains showed elevated kappa light chain measuring 512.1, lambda light chain 20.3, ratio 25.23.  07/31/2023: Bone marrow biopsy revealed hypercellular bone marrow with plasma cell neoplasm representing 28% of all cells.  08/17/2023: Cycle 1, Day 1 of Velcade /Dex  HISTORY OF PRESENTING ILLNESS:  David Mcgrath 73 y.o. male presents to the clinic for continued management of multiple myeloma. He is accompanied by his wife for this visit. He was last seen on 08/08/2023 and in the interim, he started Velcade /dex therapy.   On exam today, David Mcgrath reports he has tolerated Velcade /dex therapy without any significant symptoms. His energy and appetite are overall stable. He continues to have intermittent episodes of diarrhea that is well managed with Lomotil  as needed. He denies nausea, vomiting or abdominal pain.  He denies easy bruising or signs of active bleeding.  His chronic neuropathy is unchanged predominantly affecting his lower  legs without any worsening symptoms with Velcade  therapy. He denies fevers, chills, night sweats, shortness of breath, chest pain, bone or back pain.  He has no other complaints.  MEDICAL HISTORY:  Past Medical History:  Diagnosis Date   Acute hypoxic respiratory failure (HCC) 06/04/2023   AKI (acute kidney injury) (HCC) 06/04/2023   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/04/2018   Depression 02/25/2014   Managed well with Prozac  20 mg po daily.  Patient reports he does not have symptoms as of 02/25/14.     Diarrhea 06/04/2023   Essential hypertension 06/04/2023   Fatty liver 06/04/2023   Flu 06/04/2023   Hyperlipidemia 06/04/2023   Ileus (HCC) 06/04/2023   Neuropathy    Paroxysmal atrial fibrillation (HCC) 07/04/2023   Pneumonia 06/04/2023    SURGICAL HISTORY: Past Surgical History:  Procedure Laterality Date   IR BONE MARROW BIOPSY & ASPIRATION  07/31/2023   NO PAST SURGERIES      SOCIAL HISTORY: Social History   Socioeconomic History   Marital status: Married    Spouse name: Nellie Banas   Number of children: 0   Years of education: Not on file   Highest education level: Not on file  Occupational History   Occupation: Event organiser  Tobacco Use   Smoking status: Never   Smokeless tobacco: Never  Substance and Sexual Activity   Alcohol  use: Yes    Comment: socialy   Drug use: No   Sexual activity: Not on file  Other Topics Concern   Not on file  Social History Narrative   Lives with spouse   Right handed   Drinks 4+ cups of caffeine daily   Retired    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 investment banker  Strain: Not on file  Food Insecurity: No Food Insecurity (06/04/2023)   Hunger Vital Sign    Worried About Running Out of Food in the Last Year: Never true    Ran Out of Food in the Last Year: Never true  Transportation Needs: No Transportation Needs (06/04/2023)   PRAPARE - Administrator, Civil Service (Medical): No    Lack of Transportation  (Non-Medical): No  Physical Activity: Not on file  Stress: Not on file  Social Connections: Socially Integrated (06/04/2023)   Social Connection and Isolation Panel [NHANES]    Frequency of Communication with Friends and Family: More than three times a week    Frequency of Social Gatherings with Friends and Family: Twice a week    Attends Religious Services: More than 4 times per year    Active Member of [employer] or Organizations: Yes    Attends Engineer, structural: More than 4 times per year    Marital Status: Married  Catering manager Violence: Not At Risk (06/04/2023)   Humiliation, Afraid, Rape, and Kick questionnaire    Fear of Current or Ex-Partner: No    Emotionally Abused: No    Physically Abused: No    Sexually Abused: No    FAMILY HISTORY: Family History  Problem Relation Age of Onset   Diabetes Mother    Cancer Mother    Heart disease Mother    Cancer Father    Hyperlipidemia Father    Neuropathy Paternal Uncle    Migraines Neg Hx     ALLERGIES:  is allergic to shrimp [shellfish allergy].  MEDICATIONS:  Current Outpatient Medications  Medication Sig Dispense Refill   acyclovir  (ZOVIRAX ) 400 MG tablet Take 1 tablet (400 mg total) by mouth 2 (two) times daily. 60 tablet 3   allopurinol  (ZYLOPRIM ) 300 MG tablet Take 300 mg by mouth daily.     apixaban  (ELIQUIS ) 5 MG TABS tablet Take 1 tablet (5 mg total) by mouth 2 (two) times daily. 60 tablet 12   Cholecalciferol (VITAMIN D-3) 125 MCG (5000 UT) TABS Take 5,000 Units by mouth daily.     Cyanocobalamin  (VITAMIN B12) 1000 MCG TBCR Take 1,000 mcg by mouth daily.     dexamethasone  (DECADRON ) 4 MG tablet Take 40 mg by mouth once a week on day of Velcade  injection. 40 tablet 0   digoxin  (LANOXIN ) 0.125 MG tablet Digoxin  0.25 mg(2 tablets) daily for 1 week then decrease it to 0.125 mg (1 tablet) daily (Patient taking differently: Take 0.125 mg by mouth daily.) 97 tablet 3   ezetimibe (ZETIA) 10 MG tablet Take 10 mg by  mouth every evening.     FLUoxetine  (PROZAC ) 20 MG capsule Take 20 mg by mouth daily.     fluticasone (FLONASE) 50 MCG/ACT nasal spray Place 1 spray into both nostrils daily.     gabapentin  (NEURONTIN ) 300 MG capsule Take 2 capsules (600 mg total) by mouth 2 (two) times daily. (Patient taking differently: Take 600 mg by mouth 3 (three) times daily.)     guaiFENesin  (MUCINEX ) 600 MG 12 hr tablet Take 600 mg by mouth daily.     guaifenesin  (ROBITUSSIN) 100 MG/5ML syrup Take 200 mg by mouth daily as needed for cough.     melatonin 5 MG TABS Take 5 mg by mouth at bedtime.     omeprazole (PRILOSEC) 40 MG capsule Take 40 mg by mouth daily.     ondansetron  (ZOFRAN ) 8 MG tablet Take 1 tablet (8 mg total)  by mouth every 8 (eight) hours as needed for nausea or vomiting. 30 tablet 1   pravastatin (PRAVACHOL) 80 MG tablet Take 80 mg by mouth at bedtime.     prochlorperazine  (COMPAZINE ) 10 MG tablet Take 1 tablet (10 mg total) by mouth every 6 (six) hours as needed for nausea or vomiting. 30 tablet 1   pyridoxine (B-6) 100 MG tablet Take 100 mg by mouth daily.     diphenoxylate -atropine  (LOMOTIL ) 2.5-0.025 MG tablet Take 1 tablet by mouth 4 (four) times daily as needed for diarrhea or loose stools. 120 tablet 0   No current facility-administered medications for this visit.    REVIEW OF SYSTEMS:   All other systems are reviewed and are negative for acute change except as noted in the HPI.  PHYSICAL EXAMINATION: ECOG PERFORMANCE STATUS: 1 - Symptomatic but completely ambulatory  Vitals:   08/23/23 1058  BP: 122/62  Pulse: 95  Resp: 16  Temp: 97.6 F (36.4 C)  SpO2: 99%    Filed Weights   08/23/23 1058  Weight: 212 lb 3.2 oz (96.3 kg)     Physical Exam Vitals reviewed.  Constitutional:      Appearance: He is not ill-appearing or toxic-appearing.  HENT:     Head: Normocephalic.     Right Ear: External ear normal.     Left Ear: External ear normal.     Mouth/Throat:     Mouth: Mucous  membranes are moist.     Pharynx: Oropharynx is clear.  Eyes:     General: No scleral icterus.    Conjunctiva/sclera: Conjunctivae normal.  Cardiovascular:     Rate and Rhythm: Normal rate and regular rhythm.     Pulses: Normal pulses.     Heart sounds: Murmur heard.  Pulmonary:     Effort: Pulmonary effort is normal.     Breath sounds: Normal breath sounds.  Musculoskeletal:        General: Normal range of motion.     Cervical back: Normal range of motion.  Skin:    General: Skin is warm and dry.  Neurological:     Mental Status: He is alert.  Psychiatric:        Mood and Affect: Mood normal.       LABORATORY DATA:  I have reviewed the data as listed    Latest Ref Rng & Units 08/23/2023   10:32 AM 08/17/2023    3:14 PM 08/08/2023   11:54 AM  CBC  WBC 4.0 - 10.5 K/uL 10.9  15.2  8.6   Hemoglobin 13.0 - 17.0 g/dL 16.1  09.6  04.5   Hematocrit 39.0 - 52.0 % 33.4  33.0  32.2   Platelets 150 - 400 K/uL 375  436  352        Latest Ref Rng & Units 08/23/2023   10:32 AM 08/17/2023    3:14 PM 08/08/2023   11:54 AM  CMP  Glucose 70 - 99 mg/dL 409  811  96   BUN 8 - 23 mg/dL 7  7  8    Creatinine 0.61 - 1.24 mg/dL 9.14  7.82  9.56   Sodium 135 - 145 mmol/L 138  135  138   Potassium 3.5 - 5.1 mmol/L 3.5  3.4  3.3   Chloride 98 - 111 mmol/L 103  102  105   CO2 22 - 32 mmol/L 29  25  27    Calcium 8.9 - 10.3 mg/dL 8.3  8.1  8.3   Total  Protein 6.5 - 8.1 g/dL 5.6  5.7  5.8   Total Bilirubin 0.0 - 1.2 mg/dL 0.4  0.4  0.5   Alkaline Phos 38 - 126 U/L 52  56  60   AST 15 - 41 U/L 48  51  38   ALT 0 - 44 U/L 42  37  30      RADIOGRAPHIC STUDIES: I have personally reviewed the radiological images as listed and agreed with the findings in the report. IR BONE MARROW BIOPSY & ASPIRATION Result Date: 07/31/2023 INDICATION: 73 year old male with a history of monoclonal gammopathy of undetermined significance. He presents for fluoroscopic guided bone marrow aspiration and core biopsy.  EXAM: FLUOROSCOPIC GUIDED BONE MARROW ASPIRATION AND CORE BIOPSY Interventional Radiologist:  Roxie Cord, MD MEDICATIONS: None. ANESTHESIA/SEDATION: Moderate (conscious) sedation was employed during this procedure. A total of 1 milligrams versed  and 50 micrograms fentanyl  were administered intravenously. The patient's level of consciousness and vital signs were monitored continuously by radiology nursing throughout the procedure under my direct supervision. Total monitored sedation time: 10 minutes FLUOROSCOPY: RADIATION EXPOSURE INDEX: 2 MGY REFERENCE AIR KERMA COMPLICATIONS: None immediate. Estimated blood loss: <25 mL PROCEDURE: Informed written consent was obtained from the patient after a thorough discussion of the procedural risks, benefits and alternatives. All questions were addressed. Maximal Sterile Barrier Technique was utilized including caps, mask, sterile gowns, sterile gloves, sterile drape, hand hygiene and skin antiseptic. A timeout was performed prior to the initiation of the procedure. The patient was positioned prone and non-contrast localization fluoroscopy was performed of the pelvis to demonstrate the iliac marrow spaces. Maximal barrier sterile technique utilized including caps, mask, sterile gowns, sterile gloves, large sterile drape, hand hygiene, and betadine prep. Under sterile conditions and local anesthesia, an 11 gauge coaxial bone biopsy needle was advanced into the left iliac marrow space. Needle position was confirmed with imaging. Initially, bone marrow aspiration was performed. Next, the 11 gauge outer cannula was utilized to obtain a left iliac bone marrow core biopsy. Needle was removed. Hemostasis was obtained with compression. The patient tolerated the procedure well. Samples were prepared with the cytotechnologist. IMPRESSION: Successful fluoroscopic guided bone marrow aspiration and core biopsy of the left iliac bone. Electronically Signed   By: Fernando Hoyer  M.D.   On: 07/31/2023 09:37   ECHOCARDIOGRAM COMPLETE Result Date: 07/27/2023    ECHOCARDIOGRAM REPORT   Patient Name:   David Mcgrath Date of Exam: 07/27/2023 Medical Rec #:  [id number]               Height:       76.0 in Accession #:    [id number]              Weight:       215.9 lb Date of Birth:  July 19, 1950               BSA:          2.288 m Patient Age:    73 years                BP:           129/67 mmHg Patient Gender: M                       HR:           70 bpm. Exam Location:  [place] Procedure: 2D Echo, Cardiac Doppler and Color Doppler (Both Spectral and Color  Flow Doppler were utilized during procedure). Indications:    Paroxysmal atrial fibrillation (HCC) [I48.0 (ICD-10-CM)  History:        Patient has no prior history of Echocardiogram examinations.                 Arrythmias:Atrial Fibrillation and Tachycardia; Risk                 Factors:Hypertension, Dyslipidemia and Non-Smoker.  Sonographer:    Lyndal Sandy RDMS, RVT, RDCS Referring Phys: [id number] RAJAN R [facility] IMPRESSIONS  1. Left ventricular ejection fraction, by estimation, is 60 to 65%. The left ventricle has normal function. The left ventricle has no regional wall motion abnormalities. Left ventricular diastolic parameters were normal.  2. Right ventricular systolic function is normal. The right ventricular size is normal.  3. The mitral valve is normal in structure. Mild mitral valve regurgitation. No evidence of mitral stenosis.  4. The aortic valve is calcified. There is moderate calcification of the aortic valve. There is moderate thickening of the aortic valve. Aortic valve regurgitation is not visualized. No aortic stenosis is present.  5. The inferior vena cava is normal in size with greater than 50% respiratory variability, suggesting right atrial pressure of 3 mmHg. FINDINGS  Left Ventricle: Left ventricular ejection fraction, by estimation, is 60 to 65%. The left ventricle has normal function. The left  ventricle has no regional wall motion abnormalities. The left ventricular internal cavity size was normal in size. There is  no left ventricular hypertrophy. Left ventricular diastolic parameters were normal. Right Ventricle: The right ventricular size is normal. No increase in right ventricular wall thickness. Right ventricular systolic function is normal. Left Atrium: Left atrial size was normal in size. Right Atrium: Right atrial size was normal in size. Pericardium: There is no evidence of pericardial effusion. Mitral Valve: The mitral valve is normal in structure. Mild mitral valve regurgitation. No evidence of mitral valve stenosis. Tricuspid Valve: The tricuspid valve is normal in structure. Tricuspid valve regurgitation is mild . No evidence of tricuspid stenosis. Aortic Valve: The aortic valve is calcified. There is moderate calcification of the aortic valve. There is moderate thickening of the aortic valve. Aortic valve regurgitation is not visualized. No aortic stenosis is present. Aortic valve mean gradient measures 5.5 mmHg. Aortic valve peak gradient measures 9.2 mmHg. Aortic valve area, by VTI measures 2.29 cm. Pulmonic Valve: The pulmonic valve was normal in structure. Pulmonic valve regurgitation is not visualized. No evidence of pulmonic stenosis. Aorta: The aortic root is normal in size and structure. Venous: The inferior vena cava is normal in size with greater than 50% respiratory variability, suggesting right atrial pressure of 3 mmHg. IAS/Shunts: No atrial level shunt detected by color flow Doppler.  LEFT VENTRICLE PLAX 2D LVIDd:         3.20 cm      Diastology LVIDs:         2.00 cm      LV e' medial:    7.83 cm/s LV PW:         1.10 cm      LV E/e' medial:  11.1 LV IVS:        1.50 cm      LV e' lateral:   9.68 cm/s LVOT diam:     2.00 cm      LV E/e' lateral: 9.0 LV SV:         75 LV SV Index:   33 LVOT Area:  3.14 cm  LV Volumes (MOD) LV vol d, MOD A2C: 113.0 ml LV vol d, MOD A4C:  95.8 ml LV vol s, MOD A2C: 35.5 ml LV vol s, MOD A4C: 30.8 ml LV SV MOD A2C:     77.5 ml LV SV MOD A4C:     95.8 ml LV SV MOD BP:      70.1 ml RIGHT VENTRICLE RV S prime:     11.90 cm/s TAPSE (M-mode): 2.1 cm LEFT ATRIUM           Index        RIGHT ATRIUM           Index LA diam:      3.50 cm 1.53 cm/m   RA Area:     19.20 cm LA Vol (A2C): 42.9 ml 18.75 ml/m  RA Volume:   51.50 ml  22.50 ml/m LA Vol (A4C): 81.9 ml 35.79 ml/m  AORTIC VALVE AV Area (Vmax):    2.38 cm AV Area (Vmean):   2.10 cm AV Area (VTI):     2.29 cm AV Vmax:           151.50 cm/s AV Vmean:          110.500 cm/s AV VTI:            0.329 m AV Peak Grad:      9.2 mmHg AV Mean Grad:      5.5 mmHg LVOT Vmax:         115.00 cm/s LVOT Vmean:        74.000 cm/s LVOT VTI:          0.240 m LVOT/AV VTI ratio: 0.73  AORTA Ao Root diam: 4.00 cm Ao Asc diam:  3.50 cm MITRAL VALVE               TRICUSPID VALVE MV Area (PHT): 4.86 cm    TR Peak grad:   35.8 mmHg MV Decel Time: 156 msec    TR Vmax:        299.00 cm/s MR Peak grad: 51.6 mmHg MR Vmax:      359.00 cm/s  SHUNTS MV E velocity: 87.30 cm/s  Systemic VTI:  0.24 m MV A velocity: 76.60 cm/s  Systemic Diam: 2.00 cm MV E/A ratio:  1.14 Ralene Burger MD Electronically signed by Ralene Burger MD Signature Date/Time: 07/27/2023/9:25:28 PM    Final     ASSESSMENT & PLAN David Mcgrath is a 73 y.o. male presenting [facility] the [facility] for consultation regarding lytic lesions. We have reviewed etiologies including metastatic disease vs lymphoproliferative disorder . Patient will proceed with laboratory workup today.   #IgG Kappa Multiple Myeloma: -Initially presented with scattered bony lytic lesions on CT CAP from 06/08/2023.  -Baseline labs from 07/13/2023 included SPEP detecting M protein measuring 0.3 g/dL. Kappa light chain elevated to 512.1, lambda light chain 20.3, ratio 25.23.  -Bone met survey from 07/20/2023 showed rxpansile lucent lesions in the right T1 transverse  process, posterior right sixth rib, and left posteromedial fourth rib -Bone marrow biopsy confirmed plasma cell neoplasm measuring 28% of all cells.  -Recommend Velcade  plus Dexamethasone  therapy, started on 08/17/2023. Plan to add Revlimid with Cycle 2 if tolerable.  PLAN: --Due for Cycle 1, Day 8 of Velcade  plus Dexamethasone  today --Labs from today were reviewed and adequate for treatment. WBC 10.9, Hgb 11.4, Plt 375, Creatinine normal.  --Proceed with weekly treatments without any dose modifications.  --RTC in 2 weeks  for a toxicity check  #Peripheral neuropathy: --Evaluated by neurology before MM diagnosis.  --EMG nerve conduction study done on 01/30/2020 by Dr. Salli Crawley confirms severe axonal sensorimotor polyneuropathy as well as moderate right carpal tunnel.  --Currently on gabapentin  600 mg TID.  --Monitor closely with initiation of velcade  as neuropathy can worsen.   #Supportive Care -- chemotherapy education to be scheduled  -- zofran  8mg  q8H PRN and compazine  10mg  PO q6H for nausea -- acyclovir  400mg  PO BID for VCZ prophylaxis -- will plan to add Zometa once he obtains dental clearance.  -- no pain medication required at this time.   #Age related screenings -Colorectal screening UTD. -PSA from 07/13/2023 was normal at 0.4.    Patient expressed understanding of the recommended workup and is agreeable to move forward.   All questions were answered. The patient knows to call the clinic with any problems, questions or concerns.  No orders of the defined types were placed in this encounter.   I have spent a total of 30 minutes minutes of face-to-face and non-face-to-face time, preparing to see the patient, obtaining and/or reviewing separately obtained history, performing a medically appropriate examination, counseling and educating the patient, ordering medications/tests/procedures, referring and communicating with other health care professionals, documenting clinical  information in the electronic health record, independently interpreting results and communicating results to the patient, and care coordination.   Wyline Hearing PA-C Dept of Hematology and Oncology [facility] Phone: [phone]

## 2023-08-24 ENCOUNTER — Encounter: Payer: Self-pay | Admitting: *Deleted

## 2023-08-24 ENCOUNTER — Encounter: Payer: Self-pay | Admitting: Hematology and Oncology

## 2023-08-24 DIAGNOSIS — Z85828 Personal history of other malignant neoplasm of skin: Secondary | ICD-10-CM | POA: Diagnosis not present

## 2023-08-24 DIAGNOSIS — G603 Idiopathic progressive neuropathy: Secondary | ICD-10-CM | POA: Diagnosis not present

## 2023-08-24 DIAGNOSIS — M899 Disorder of bone, unspecified: Secondary | ICD-10-CM | POA: Diagnosis not present

## 2023-08-24 DIAGNOSIS — J690 Pneumonitis due to inhalation of food and vomit: Secondary | ICD-10-CM | POA: Diagnosis not present

## 2023-08-24 DIAGNOSIS — E785 Hyperlipidemia, unspecified: Secondary | ICD-10-CM | POA: Diagnosis not present

## 2023-08-24 DIAGNOSIS — M109 Gout, unspecified: Secondary | ICD-10-CM | POA: Diagnosis not present

## 2023-08-24 DIAGNOSIS — F419 Anxiety disorder, unspecified: Secondary | ICD-10-CM | POA: Diagnosis not present

## 2023-08-24 DIAGNOSIS — F32A Depression, unspecified: Secondary | ICD-10-CM | POA: Diagnosis not present

## 2023-08-24 DIAGNOSIS — I10 Essential (primary) hypertension: Secondary | ICD-10-CM | POA: Diagnosis not present

## 2023-08-24 DIAGNOSIS — J101 Influenza due to other identified influenza virus with other respiratory manifestations: Secondary | ICD-10-CM | POA: Diagnosis not present

## 2023-08-24 DIAGNOSIS — K219 Gastro-esophageal reflux disease without esophagitis: Secondary | ICD-10-CM | POA: Diagnosis not present

## 2023-08-24 DIAGNOSIS — K760 Fatty (change of) liver, not elsewhere classified: Secondary | ICD-10-CM | POA: Diagnosis not present

## 2023-08-24 DIAGNOSIS — G4733 Obstructive sleep apnea (adult) (pediatric): Secondary | ICD-10-CM | POA: Diagnosis not present

## 2023-08-25 DIAGNOSIS — I10 Essential (primary) hypertension: Secondary | ICD-10-CM | POA: Diagnosis not present

## 2023-08-25 DIAGNOSIS — F32A Depression, unspecified: Secondary | ICD-10-CM | POA: Diagnosis not present

## 2023-08-25 DIAGNOSIS — G603 Idiopathic progressive neuropathy: Secondary | ICD-10-CM | POA: Diagnosis not present

## 2023-08-25 DIAGNOSIS — G4733 Obstructive sleep apnea (adult) (pediatric): Secondary | ICD-10-CM | POA: Diagnosis not present

## 2023-08-25 DIAGNOSIS — J101 Influenza due to other identified influenza virus with other respiratory manifestations: Secondary | ICD-10-CM | POA: Diagnosis not present

## 2023-08-25 DIAGNOSIS — K760 Fatty (change of) liver, not elsewhere classified: Secondary | ICD-10-CM | POA: Diagnosis not present

## 2023-08-25 DIAGNOSIS — J690 Pneumonitis due to inhalation of food and vomit: Secondary | ICD-10-CM | POA: Diagnosis not present

## 2023-08-25 DIAGNOSIS — K219 Gastro-esophageal reflux disease without esophagitis: Secondary | ICD-10-CM | POA: Diagnosis not present

## 2023-08-25 DIAGNOSIS — E785 Hyperlipidemia, unspecified: Secondary | ICD-10-CM | POA: Diagnosis not present

## 2023-08-25 DIAGNOSIS — Z85828 Personal history of other malignant neoplasm of skin: Secondary | ICD-10-CM | POA: Diagnosis not present

## 2023-08-25 DIAGNOSIS — M899 Disorder of bone, unspecified: Secondary | ICD-10-CM | POA: Diagnosis not present

## 2023-08-25 DIAGNOSIS — M109 Gout, unspecified: Secondary | ICD-10-CM | POA: Diagnosis not present

## 2023-08-25 DIAGNOSIS — F419 Anxiety disorder, unspecified: Secondary | ICD-10-CM | POA: Diagnosis not present

## 2023-08-27 DIAGNOSIS — F32A Depression, unspecified: Secondary | ICD-10-CM | POA: Diagnosis not present

## 2023-08-27 DIAGNOSIS — F419 Anxiety disorder, unspecified: Secondary | ICD-10-CM | POA: Diagnosis not present

## 2023-08-27 DIAGNOSIS — K760 Fatty (change of) liver, not elsewhere classified: Secondary | ICD-10-CM | POA: Diagnosis not present

## 2023-08-27 DIAGNOSIS — M109 Gout, unspecified: Secondary | ICD-10-CM | POA: Diagnosis not present

## 2023-08-27 DIAGNOSIS — G603 Idiopathic progressive neuropathy: Secondary | ICD-10-CM | POA: Diagnosis not present

## 2023-08-27 DIAGNOSIS — J101 Influenza due to other identified influenza virus with other respiratory manifestations: Secondary | ICD-10-CM | POA: Diagnosis not present

## 2023-08-27 DIAGNOSIS — M899 Disorder of bone, unspecified: Secondary | ICD-10-CM | POA: Diagnosis not present

## 2023-08-27 DIAGNOSIS — G4733 Obstructive sleep apnea (adult) (pediatric): Secondary | ICD-10-CM | POA: Diagnosis not present

## 2023-08-27 DIAGNOSIS — I10 Essential (primary) hypertension: Secondary | ICD-10-CM | POA: Diagnosis not present

## 2023-08-27 DIAGNOSIS — E785 Hyperlipidemia, unspecified: Secondary | ICD-10-CM | POA: Diagnosis not present

## 2023-08-27 DIAGNOSIS — Z85828 Personal history of other malignant neoplasm of skin: Secondary | ICD-10-CM | POA: Diagnosis not present

## 2023-08-27 DIAGNOSIS — K219 Gastro-esophageal reflux disease without esophagitis: Secondary | ICD-10-CM | POA: Diagnosis not present

## 2023-08-27 DIAGNOSIS — J690 Pneumonitis due to inhalation of food and vomit: Secondary | ICD-10-CM | POA: Diagnosis not present

## 2023-08-29 DIAGNOSIS — I10 Essential (primary) hypertension: Secondary | ICD-10-CM | POA: Diagnosis not present

## 2023-08-29 DIAGNOSIS — M109 Gout, unspecified: Secondary | ICD-10-CM | POA: Diagnosis not present

## 2023-08-29 DIAGNOSIS — Z85828 Personal history of other malignant neoplasm of skin: Secondary | ICD-10-CM | POA: Diagnosis not present

## 2023-08-29 DIAGNOSIS — K760 Fatty (change of) liver, not elsewhere classified: Secondary | ICD-10-CM | POA: Diagnosis not present

## 2023-08-29 DIAGNOSIS — E785 Hyperlipidemia, unspecified: Secondary | ICD-10-CM | POA: Diagnosis not present

## 2023-08-29 DIAGNOSIS — K219 Gastro-esophageal reflux disease without esophagitis: Secondary | ICD-10-CM | POA: Diagnosis not present

## 2023-08-29 DIAGNOSIS — F419 Anxiety disorder, unspecified: Secondary | ICD-10-CM | POA: Diagnosis not present

## 2023-08-29 DIAGNOSIS — F32A Depression, unspecified: Secondary | ICD-10-CM | POA: Diagnosis not present

## 2023-08-29 DIAGNOSIS — M899 Disorder of bone, unspecified: Secondary | ICD-10-CM | POA: Diagnosis not present

## 2023-08-29 DIAGNOSIS — G4733 Obstructive sleep apnea (adult) (pediatric): Secondary | ICD-10-CM | POA: Diagnosis not present

## 2023-08-29 DIAGNOSIS — J690 Pneumonitis due to inhalation of food and vomit: Secondary | ICD-10-CM | POA: Diagnosis not present

## 2023-08-29 DIAGNOSIS — G603 Idiopathic progressive neuropathy: Secondary | ICD-10-CM | POA: Diagnosis not present

## 2023-08-29 DIAGNOSIS — J101 Influenza due to other identified influenza virus with other respiratory manifestations: Secondary | ICD-10-CM | POA: Diagnosis not present

## 2023-08-30 DIAGNOSIS — K760 Fatty (change of) liver, not elsewhere classified: Secondary | ICD-10-CM | POA: Diagnosis not present

## 2023-08-30 DIAGNOSIS — K219 Gastro-esophageal reflux disease without esophagitis: Secondary | ICD-10-CM | POA: Diagnosis not present

## 2023-08-30 DIAGNOSIS — Z7901 Long term (current) use of anticoagulants: Secondary | ICD-10-CM | POA: Diagnosis not present

## 2023-08-30 DIAGNOSIS — F419 Anxiety disorder, unspecified: Secondary | ICD-10-CM | POA: Diagnosis not present

## 2023-08-30 DIAGNOSIS — M109 Gout, unspecified: Secondary | ICD-10-CM | POA: Diagnosis not present

## 2023-08-30 DIAGNOSIS — Z85828 Personal history of other malignant neoplasm of skin: Secondary | ICD-10-CM | POA: Diagnosis not present

## 2023-08-30 DIAGNOSIS — G603 Idiopathic progressive neuropathy: Secondary | ICD-10-CM | POA: Diagnosis not present

## 2023-08-30 DIAGNOSIS — G4733 Obstructive sleep apnea (adult) (pediatric): Secondary | ICD-10-CM | POA: Diagnosis not present

## 2023-08-30 DIAGNOSIS — M899 Disorder of bone, unspecified: Secondary | ICD-10-CM | POA: Diagnosis not present

## 2023-08-30 DIAGNOSIS — I10 Essential (primary) hypertension: Secondary | ICD-10-CM | POA: Diagnosis not present

## 2023-08-30 DIAGNOSIS — E785 Hyperlipidemia, unspecified: Secondary | ICD-10-CM | POA: Diagnosis not present

## 2023-08-31 ENCOUNTER — Inpatient Hospital Stay

## 2023-08-31 ENCOUNTER — Other Ambulatory Visit: Payer: Self-pay | Admitting: Hematology and Oncology

## 2023-08-31 VITALS — BP 129/87 | HR 86 | Temp 98.0°F | Resp 18 | Ht 76.0 in | Wt 214.0 lb

## 2023-08-31 DIAGNOSIS — C9000 Multiple myeloma not having achieved remission: Secondary | ICD-10-CM

## 2023-08-31 DIAGNOSIS — Z5112 Encounter for antineoplastic immunotherapy: Secondary | ICD-10-CM | POA: Diagnosis not present

## 2023-08-31 LAB — CBC WITH DIFFERENTIAL (CANCER CENTER ONLY)
Abs Immature Granulocytes: 0.03 10*3/uL (ref 0.00–0.07)
Basophils Absolute: 0 10*3/uL (ref 0.0–0.1)
Basophils Relative: 0 %
Eosinophils Absolute: 0 10*3/uL (ref 0.0–0.5)
Eosinophils Relative: 0 %
HCT: 33.8 % — ABNORMAL LOW (ref 39.0–52.0)
Hemoglobin: 11.5 g/dL — ABNORMAL LOW (ref 13.0–17.0)
Immature Granulocytes: 0 %
Lymphocytes Relative: 3 %
Lymphs Abs: 0.2 10*3/uL — ABNORMAL LOW (ref 0.7–4.0)
MCH: 31.7 pg (ref 26.0–34.0)
MCHC: 34 g/dL (ref 30.0–36.0)
MCV: 93.1 fL (ref 80.0–100.0)
Monocytes Absolute: 0.1 10*3/uL (ref 0.1–1.0)
Monocytes Relative: 1 %
Neutro Abs: 8.2 10*3/uL — ABNORMAL HIGH (ref 1.7–7.7)
Neutrophils Relative %: 96 %
Platelet Count: 272 10*3/uL (ref 150–400)
RBC: 3.63 MIL/uL — ABNORMAL LOW (ref 4.22–5.81)
RDW: 15.6 % — ABNORMAL HIGH (ref 11.5–15.5)
WBC Count: 8.6 10*3/uL (ref 4.0–10.5)
nRBC: 0 % (ref 0.0–0.2)

## 2023-08-31 LAB — CMP (CANCER CENTER ONLY)
ALT: 46 U/L — ABNORMAL HIGH (ref 0–44)
AST: 50 U/L — ABNORMAL HIGH (ref 15–41)
Albumin: 3.5 g/dL (ref 3.5–5.0)
Alkaline Phosphatase: 63 U/L (ref 38–126)
Anion gap: 5 (ref 5–15)
BUN: 6 mg/dL — ABNORMAL LOW (ref 8–23)
CO2: 29 mmol/L (ref 22–32)
Calcium: 8.5 mg/dL — ABNORMAL LOW (ref 8.9–10.3)
Chloride: 102 mmol/L (ref 98–111)
Creatinine: 0.66 mg/dL (ref 0.61–1.24)
GFR, Estimated: >60 mL/min (ref >60)
Glucose, Bld: 148 mg/dL — ABNORMAL HIGH (ref 70–99)
Potassium: 3.5 mmol/L (ref 3.5–5.1)
Sodium: 136 mmol/L (ref 135–145)
Total Bilirubin: 0.4 mg/dL (ref 0.0–1.2)
Total Protein: 6.1 g/dL — ABNORMAL LOW (ref 6.5–8.1)

## 2023-08-31 MED ORDER — BORTEZOMIB CHEMO SQ INJECTION 3.5 MG (2.5MG/ML)
1.3000 mg/m2 | Freq: Once | INTRAMUSCULAR | Status: AC
  Administered 2023-08-31: 3 mg via SUBCUTANEOUS
  Filled 2023-08-31: qty 1.2

## 2023-08-31 NOTE — Patient Instructions (Signed)
 [facility] - A DEPT OF [facility]   Discharge Instructions: Thank you for choosing [facility] to provide your oncology and hematology care.   If you have a lab appointment with the Cancer Center, please go directly to the Cancer Center and check in at the registration area.   Wear comfortable clothing and clothing appropriate for easy access to any Portacath or PICC line.   We strive to give you quality time with your provider. You may need to reschedule your appointment if you arrive late (15 or more minutes).  Arriving late affects you and other patients whose appointments are after yours.  Also, if you miss three or more appointments without notifying the office, you may be dismissed from the clinic at the provider's discretion.      For prescription refill requests, have your pharmacy contact our office and allow 72 hours for refills to be completed.    Today you received the following chemotherapy and/or immunotherapy agents: Bortezomib (Velcade)      To help prevent nausea and vomiting after your treatment, we encourage you to take your nausea medication as directed.  BELOW ARE SYMPTOMS THAT SHOULD BE REPORTED IMMEDIATELY: *FEVER GREATER THAN 100.4 F (38 C) OR HIGHER *CHILLS OR SWEATING *NAUSEA AND VOMITING THAT IS NOT CONTROLLED WITH YOUR NAUSEA MEDICATION *UNUSUAL SHORTNESS OF BREATH *UNUSUAL BRUISING OR BLEEDING *URINARY PROBLEMS (pain or burning when urinating, or frequent urination) *BOWEL PROBLEMS (unusual diarrhea, constipation, pain near the anus) TENDERNESS IN MOUTH AND THROAT WITH OR WITHOUT PRESENCE OF ULCERS (sore throat, sores in mouth, or a toothache) UNUSUAL RASH, SWELLING OR PAIN  UNUSUAL VAGINAL DISCHARGE OR ITCHING   Items with * indicate a potential emergency and should be followed up as soon as possible or go to the Emergency Department if any problems should occur.  Please show the CHEMOTHERAPY ALERT CARD or  IMMUNOTHERAPY ALERT CARD at check-in to the Emergency Department and triage nurse.  Should you have questions after your visit or need to cancel or reschedule your appointment, please contact [facility] - A DEPT OF [facility]  Dept: [phone]  and follow the prompts.  Office hours are 8:00 a.m. to 4:30 p.m. Monday - Friday. Please note that voicemails left after 4:00 p.m. may not be returned until the following business day.  We are closed weekends and major holidays. You have access to a nurse at all times for urgent questions. Please call the main number to the clinic Dept: [phone] and follow the prompts.   For any non-urgent questions, you may also contact your provider using MyChart. We now offer e-Visits for anyone 75 and older to request care online for non-urgent symptoms. For details visit [email or web address].   Also download the MyChart app! Go to the app store, search "MyChart", open the app, select , and log in with your MyChart username and password.

## 2023-08-31 NOTE — Progress Notes (Signed)
 Pt reports he took 40 mg of dexamethasone  at home this morining

## 2023-09-01 ENCOUNTER — Encounter: Payer: Self-pay | Admitting: Cardiology

## 2023-09-01 ENCOUNTER — Ambulatory Visit: Payer: Self-pay | Attending: Cardiology | Admitting: Cardiology

## 2023-09-01 ENCOUNTER — Other Ambulatory Visit: Payer: Self-pay

## 2023-09-01 VITALS — BP 122/68 | HR 74 | Ht 76.0 in | Wt 216.6 lb

## 2023-09-01 DIAGNOSIS — I700 Atherosclerosis of aorta: Secondary | ICD-10-CM | POA: Diagnosis not present

## 2023-09-01 DIAGNOSIS — E782 Mixed hyperlipidemia: Secondary | ICD-10-CM | POA: Diagnosis not present

## 2023-09-01 DIAGNOSIS — I480 Paroxysmal atrial fibrillation: Secondary | ICD-10-CM

## 2023-09-01 NOTE — Progress Notes (Signed)
 " Cardiology Office Note:  .   Date:  09/03/2023  ID:  David Mcgrath [name], DOB Feb 01, 1951, MRN [id number] PCP: [name], DO  [facility] Providers Cardiologist:  Gordy Bergamo, MD   History of Present Illness: .   David Mcgrath is a 73 y.o. Caucasian male patient with hypertension, hypercholesterolemia, hepatic steatosis, paroxysmal atrial fibrillation noted on 07/04/2023, scheduled for direct-current cardioversion on 08/10/2023 but back in sinus rhythm and wants to establish with me.  Echocardiogram 07/28/2023 revealed LVEF of 60 to 65% without significant valvular abnormalities with mild aortic root dilatation at 4 cm.  He was diagnosed with multiple myeloma with lytic lesions in the bones and spine during abdominal CT on 06/08/2023 performed for abdominal pain.  He wanted to transfer care under me as he lives close by.  Discussed the use of AI scribe software for clinical note transcription with the patient, who gave verbal consent to proceed.  History of Present Illness David Mcgrath is a 73 year old male with paroxysmal atrial fibrillation who presents for follow-up after recent hospitalization. He is accompanied by his wife. He was referred by Dr. Skates Valentin for evaluation of atrial fibrillation.  He has paroxysmal atrial fibrillation, first identified in April 2025, with no significant symptoms directly linked to AFib. He experiences shortness of breath and fatigue over the past nine months. A scheduled cardioversion was not performed as he was not in AFib at the time.  His symptoms also correlated with a new diagnosis of metastatic multiple myeloma.  He has lost significant amount of weight since recent hospitalization for management and evaluation of the same.  He is accompanied by his wife and presents to establish care.  He experienced a persistent cough for nine months, which has improved significantly. During a recent 11-day hospitalization, he lost 38  pounds. His strength is improving with physical therapy. He denies current chest pain or significant changes in energy levels. Breathing has improved with a reduction in cough.  He has high cholesterol and high blood pressure. Blood pressure medications were stopped during hospitalization due to hypotension. He was on olmesartan, reduced to 20 mg before discontinuation. He currently takes pravastatin  20 mg for cholesterol.  He has a history of smoking, quitting 40 years ago, and was previously athletic, attending the gym regularly until recent health issues. His mother had significant plaque buildup and complications from smoking.  Labs   Lab Results  Component Value Date   NA 136 08/31/2023   K 3.5 08/31/2023   CO2 29 08/31/2023   GLUCOSE 148 (H) 08/31/2023   BUN 6 (L) 08/31/2023   CREATININE 0.66 08/31/2023   CALCIUM  8.5 (L) 08/31/2023   EGFR 97 08/02/2023   GFRNONAA >60 08/31/2023      Latest Ref Rng & Units 08/31/2023    2:24 PM 08/23/2023   10:32 AM 08/17/2023    3:14 PM  BMP  Glucose 70 - 99 mg/dL 851  853  777   BUN 8 - 23 mg/dL 6  7  7    Creatinine 0.61 - 1.24 mg/dL 9.33  9.24  9.24   Sodium 135 - 145 mmol/L 136  138  135   Potassium 3.5 - 5.1 mmol/L 3.5  3.5  3.4   Chloride 98 - 111 mmol/L 102  103  102   CO2 22 - 32 mmol/L 29  29  25    Calcium  8.9 - 10.3 mg/dL 8.5  8.3  8.1  Latest Ref Rng & Units 08/31/2023    2:24 PM 08/23/2023   10:32 AM 08/17/2023    3:14 PM  CBC  WBC 4.0 - 10.5 K/uL 8.6  10.9  15.2   Hemoglobin 13.0 - 17.0 g/dL 88.4  88.5  88.4   Hematocrit 39.0 - 52.0 % 33.8  33.4  33.0   Platelets 150 - 400 K/uL 272  375  436    No results found for: HGBA1C  Lab Results  Component Value Date   TSH 4.560 (H) 07/04/2023     ROS  Review of Systems  Cardiovascular:  Negative for chest pain, dyspnea on exertion and leg swelling.  Musculoskeletal:  Positive for arthritis.  Neurological:  Positive for numbness and paresthesias.    Physical Exam:    VS:  BP 122/68   Pulse 74   Ht 6' 4 (1.93 m)   Wt 216 lb 9.6 oz (98.2 kg)   SpO2 98%   BMI 26.37 kg/m    Wt Readings from Last 3 Encounters:  09/01/23 216 lb 9.6 oz (98.2 kg)  08/31/23 214 lb (97.1 kg)  08/23/23 212 lb 3.2 oz (96.3 kg)    Physical Exam Neck:     Vascular: No carotid bruit or JVD.  Cardiovascular:     Rate and Rhythm: Normal rate and regular rhythm. Extrasystoles are present.    Pulses: Intact distal pulses.     Heart sounds: Normal heart sounds. No murmur heard.    No gallop.  Pulmonary:     Effort: Pulmonary effort is normal.     Breath sounds: Normal breath sounds.  Abdominal:     General: Bowel sounds are normal.     Palpations: Abdomen is soft.  Musculoskeletal:     Right lower leg: No edema.     Left lower leg: No edema.   Studies Reviewed: .    CT Chest and Abd 06/08/23: Cardiovascular: Heart is nonenlarged. No pericardial effusion. Coronary artery calcifications are seen. There are calcifications along the aortic valve. The thoracic aorta is normal course and caliber. There is a bovine type aortic arch. EKG:    EKG Interpretation Date/Time:  Friday Sep 01 2023 08:46:48 EDT Ventricular Rate:  71 PR Interval:    QRS Duration:  130 QT Interval:  404 QTC Calculation: 439 R Axis:   -54  Text Interpretation: EKG 09/01/2023: Normal sinus rhythm at rate of 71 bpm, left axis deviation, left anterior fascicular block.  Right bundle branch block.  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  Single PVC and PAC.  Compared to 08/10/2023, right bundle branch block new. Confirmed by Jakeira Seeman, Jagadeesh ([id number]) on 09/01/2023 8:54:57 AM    Medications and allergies    Allergies  Allergen Reactions   Shrimp [Shellfish Allergy] Anaphylaxis and Swelling    Swelling throat      Current Outpatient Medications:    acyclovir  (ZOVIRAX ) 400 MG tablet, Take 1 tablet (400 mg total) by mouth 2 (two) times daily., Disp: 60 tablet, Rfl: 3   allopurinol  (ZYLOPRIM ) 300 MG  tablet, Take 300 mg by mouth daily., Disp: , Rfl:    apixaban  (ELIQUIS ) 5 MG TABS tablet, Take 1 tablet (5 mg total) by mouth 2 (two) times daily., Disp: 60 tablet, Rfl: 12   Cholecalciferol  (VITAMIN D -3) 125 MCG (5000 UT) TABS, Take 5,000 Units by mouth daily., Disp: , Rfl:    Cyanocobalamin  (VITAMIN B12) 1000 MCG TBCR, Take 1,000 mcg by mouth daily., Disp: , Rfl:    dexamethasone  (  DECADRON ) 4 MG tablet, Take 40 mg by mouth once a week on day of Velcade  injection., Disp: 40 tablet, Rfl: 0   diphenoxylate -atropine  (LOMOTIL ) 2.5-0.025 MG tablet, Take 1 tablet by mouth 4 (four) times daily as needed for diarrhea or loose stools., Disp: 120 tablet, Rfl: 0   ezetimibe  (ZETIA ) 10 MG tablet, Take 10 mg by mouth every evening., Disp: , Rfl:    FLUoxetine  (PROZAC ) 20 MG capsule, Take 20 mg by mouth daily., Disp: , Rfl:    fluticasone  (FLONASE ) 50 MCG/ACT nasal spray, Place 1 spray into both nostrils daily., Disp: , Rfl:    gabapentin  (NEURONTIN ) 300 MG capsule, Take 2 capsules (600 mg total) by mouth 2 (two) times daily. (Patient taking differently: Take 600 mg by mouth 3 (three) times daily.), Disp: , Rfl:    guaiFENesin  (MUCINEX ) 600 MG 12 hr tablet, Take 600 mg by mouth daily., Disp: , Rfl:    guaifenesin  (ROBITUSSIN) 100 MG/5ML syrup, Take 200 mg by mouth daily as needed for cough., Disp: , Rfl:    melatonin 5 MG TABS, Take 5 mg by mouth at bedtime., Disp: , Rfl:    omeprazole (PRILOSEC) 40 MG capsule, Take 40 mg by mouth daily., Disp: , Rfl:    ondansetron  (ZOFRAN ) 8 MG tablet, Take 1 tablet (8 mg total) by mouth every 8 (eight) hours as needed for nausea or vomiting., Disp: 30 tablet, Rfl: 1   pravastatin  (PRAVACHOL ) 80 MG tablet, Take 80 mg by mouth at bedtime., Disp: , Rfl:    prochlorperazine  (COMPAZINE ) 10 MG tablet, Take 1 tablet (10 mg total) by mouth every 6 (six) hours as needed for nausea or vomiting., Disp: 30 tablet, Rfl: 1   pyridoxine  (B-6) 100 MG tablet, Take 100 mg by mouth daily.,  Disp: , Rfl:    No orders of the defined types were placed in this encounter.    Medications Discontinued During This Encounter  Medication Reason   digoxin  (LANOXIN ) 0.125 MG tablet Discontinued by provider     ASSESSMENT AND PLAN: .      ICD-10-CM   1. Paroxysmal atrial fibrillation (HCC)  I48.0 EKG 12-Lead    2. Mixed hyperlipidemia  E78.2     3. Aortic atherosclerosis (HCC)  I70.0      Click Here to Calculate/Change CHADS2VASc Score The patient's CHADS2-VASc score is 2, indicating a 2.2% annual risk of stroke.  Therefore, anticoagulation is recommended.   CHF History: No HTN History: No Diabetes History: No Stroke History: No Vascular Disease History: Yes  If HTN + then score is 3  Assessment and Plan Assessment & Plan Paroxysmal atrial fibrillation Paroxysmal atrial fibrillation diagnosed in April 2025. Currently in sinus rhythm and asymptomatic. Previous episodes associated with fatigue and shortness of breath, but difficult to attribute symptoms solely to AFib due to concurrent multiple myeloma. Risk of stroke estimated at 2-3% due to age and vascular disease. ACC and AHA guidelines recommend anticoagulation. - Discontinue digoxin  as it is not needed for rhythm control - Continue Eliquis  for stroke prevention  Multiple myeloma Multiple myeloma with lytic lesions and rapid progression noted in the past. Currently undergoing immunotherapy with three treatments completed. Symptoms of fatigue and shortness of breath have improved with treatment. - Continue immunotherapy as planned  Peripheral neuropathy Peripheral neuropathy present, contributing to significant disability.  Aortic atherosclerosis and Hyperlipidemia Mild aortic atherosclerosis with plaque buildup and hyperlipidemia managed with pravastatin . Current cholesterol levels are well-managed, but further reduction is desired. No acute issues requiring intervention. -  Continue pravastatin  20 mg  daily  Hypertension, resolved Hypertension previously diagnosed, now resolved likely due to significant weight loss. Blood pressure currently stable without medication.  Cardiac standpoint he remains stable, no further cardiac testing is indicated at this time without evidence of any angina or heart failure symptoms.  Also presently multiple myeloma precedes all other evaluation and management.  Encephali to see him back in 6 months or sooner if there are issues. Patient is newly established with me, this was a 33-minute office encounter plus additional 8 minutes for review and documentation.   Signed,  Gordy Bergamo, MD, [facility] 09/03/2023, 1:08 PM [facility] [address] [place], [place] [address] Phone: [phone]. Fax:  [phone]  "

## 2023-09-01 NOTE — Patient Instructions (Signed)
 Medication Instructions:  Stop digoxin  *If you need a refill on your cardiac medications before your next appointment, please call your pharmacy*  Lab Work: none If you have labs (blood work) drawn today and your tests are completely normal, you will receive your results only by: MyChart Message (if you have MyChart) OR A paper copy in the mail If you have any lab test that is abnormal or we need to change your treatment, we will call you to review the results.  Testing/Procedures: none  Follow-Up: At [facility], you and your health needs are our priority.  As part of our continuing mission to provide you with exceptional heart care, our providers are all part of one team.  This team includes your primary Cardiologist (physician) and Advanced Practice Providers or APPs (Physician Assistants and Nurse Practitioners) who all work together to provide you with the care you need, when you need it.  Your next appointment:   6 month(s)  Provider:   Knox Perl, MD    We recommend signing up for the patient portal called "MyChart".  Sign up information is provided on this After Visit Summary.  MyChart is used to connect with patients for Virtual Visits (Telemedicine).  Patients are able to view lab/test results, encounter notes, upcoming appointments, etc.  Non-urgent messages can be sent to your provider as well.   To learn more about what you can do with MyChart, go to [email or web address].   Other Instructions

## 2023-09-04 ENCOUNTER — Other Ambulatory Visit: Payer: Self-pay | Admitting: Hematology and Oncology

## 2023-09-04 ENCOUNTER — Encounter: Payer: Self-pay | Admitting: Cardiology

## 2023-09-04 ENCOUNTER — Telehealth: Payer: Self-pay | Admitting: *Deleted

## 2023-09-04 NOTE — Telephone Encounter (Signed)
 TCT pt's wife per her request from a vm received this morning. Spoke with her. She states Areli had issues with nausea and diarrhea over the weekend and a bit of diarrhea this am. She is asking if she should give him the nausea meds ATC. Advised they only be given as needed-at the first hint of nausea. If taken around the clock, the compazine  would make him too sleepy all day and the Zofran  can cause headaches and constipation. She voiced understanding. He is taking lomotil  for the diarrhea-reviewed with her how to take as well. Encouraged increased fluid intake and BRAT type diet during these episodes then resume his regular diet.  She voiced understanding.  She is aware of his appts this week.

## 2023-09-05 DIAGNOSIS — K760 Fatty (change of) liver, not elsewhere classified: Secondary | ICD-10-CM | POA: Diagnosis not present

## 2023-09-05 DIAGNOSIS — Z85828 Personal history of other malignant neoplasm of skin: Secondary | ICD-10-CM | POA: Diagnosis not present

## 2023-09-05 DIAGNOSIS — M109 Gout, unspecified: Secondary | ICD-10-CM | POA: Diagnosis not present

## 2023-09-05 DIAGNOSIS — I10 Essential (primary) hypertension: Secondary | ICD-10-CM | POA: Diagnosis not present

## 2023-09-05 DIAGNOSIS — G4733 Obstructive sleep apnea (adult) (pediatric): Secondary | ICD-10-CM | POA: Diagnosis not present

## 2023-09-05 DIAGNOSIS — E785 Hyperlipidemia, unspecified: Secondary | ICD-10-CM | POA: Diagnosis not present

## 2023-09-05 DIAGNOSIS — G603 Idiopathic progressive neuropathy: Secondary | ICD-10-CM | POA: Diagnosis not present

## 2023-09-05 DIAGNOSIS — K219 Gastro-esophageal reflux disease without esophagitis: Secondary | ICD-10-CM | POA: Diagnosis not present

## 2023-09-05 DIAGNOSIS — M899 Disorder of bone, unspecified: Secondary | ICD-10-CM | POA: Diagnosis not present

## 2023-09-05 DIAGNOSIS — Z7901 Long term (current) use of anticoagulants: Secondary | ICD-10-CM | POA: Diagnosis not present

## 2023-09-05 DIAGNOSIS — F419 Anxiety disorder, unspecified: Secondary | ICD-10-CM | POA: Diagnosis not present

## 2023-09-06 ENCOUNTER — Telehealth: Payer: Self-pay | Admitting: *Deleted

## 2023-09-06 DIAGNOSIS — F419 Anxiety disorder, unspecified: Secondary | ICD-10-CM | POA: Diagnosis not present

## 2023-09-06 DIAGNOSIS — G603 Idiopathic progressive neuropathy: Secondary | ICD-10-CM | POA: Diagnosis not present

## 2023-09-06 DIAGNOSIS — E785 Hyperlipidemia, unspecified: Secondary | ICD-10-CM | POA: Diagnosis not present

## 2023-09-06 DIAGNOSIS — M899 Disorder of bone, unspecified: Secondary | ICD-10-CM | POA: Diagnosis not present

## 2023-09-06 DIAGNOSIS — M109 Gout, unspecified: Secondary | ICD-10-CM | POA: Diagnosis not present

## 2023-09-06 DIAGNOSIS — I10 Essential (primary) hypertension: Secondary | ICD-10-CM | POA: Diagnosis not present

## 2023-09-06 DIAGNOSIS — G4733 Obstructive sleep apnea (adult) (pediatric): Secondary | ICD-10-CM | POA: Diagnosis not present

## 2023-09-06 DIAGNOSIS — K219 Gastro-esophageal reflux disease without esophagitis: Secondary | ICD-10-CM | POA: Diagnosis not present

## 2023-09-06 DIAGNOSIS — Z7901 Long term (current) use of anticoagulants: Secondary | ICD-10-CM | POA: Diagnosis not present

## 2023-09-06 DIAGNOSIS — K760 Fatty (change of) liver, not elsewhere classified: Secondary | ICD-10-CM | POA: Diagnosis not present

## 2023-09-06 DIAGNOSIS — Z85828 Personal history of other malignant neoplasm of skin: Secondary | ICD-10-CM | POA: Diagnosis not present

## 2023-09-06 NOTE — Telephone Encounter (Signed)
 Received call from pt's wife, David Mcgrath. She states David Mcgrath is still having diarrhea despite the lomotil . Advised that I have discussed this with Dr. Bobbe Burner nd he will discuss this tomorrow during his appt-he may reduce the dose of Velcade . In the meantime, advised to continue the lomotil  as directed, encourage fluid intake. David Mcgrath voiced understanding.

## 2023-09-07 ENCOUNTER — Inpatient Hospital Stay: Attending: Physician Assistant

## 2023-09-07 ENCOUNTER — Encounter: Payer: Self-pay | Admitting: Hematology and Oncology

## 2023-09-07 ENCOUNTER — Other Ambulatory Visit: Payer: Self-pay | Admitting: *Deleted

## 2023-09-07 ENCOUNTER — Telehealth: Payer: Self-pay

## 2023-09-07 ENCOUNTER — Inpatient Hospital Stay: Admitting: Hematology and Oncology

## 2023-09-07 ENCOUNTER — Telehealth: Payer: Self-pay | Admitting: Pharmacy Technician

## 2023-09-07 ENCOUNTER — Inpatient Hospital Stay

## 2023-09-07 ENCOUNTER — Other Ambulatory Visit (HOSPITAL_COMMUNITY): Payer: Self-pay

## 2023-09-07 ENCOUNTER — Ambulatory Visit: Admitting: Nutrition

## 2023-09-07 VITALS — BP 104/62 | HR 87 | Temp 97.7°F | Resp 15 | Wt 204.5 lb

## 2023-09-07 DIAGNOSIS — Z79899 Other long term (current) drug therapy: Secondary | ICD-10-CM | POA: Insufficient documentation

## 2023-09-07 DIAGNOSIS — Z7961 Long term (current) use of immunomodulator: Secondary | ICD-10-CM | POA: Insufficient documentation

## 2023-09-07 DIAGNOSIS — C9000 Multiple myeloma not having achieved remission: Secondary | ICD-10-CM

## 2023-09-07 DIAGNOSIS — Z5112 Encounter for antineoplastic immunotherapy: Secondary | ICD-10-CM | POA: Diagnosis not present

## 2023-09-07 DIAGNOSIS — G629 Polyneuropathy, unspecified: Secondary | ICD-10-CM | POA: Insufficient documentation

## 2023-09-07 DIAGNOSIS — Z7901 Long term (current) use of anticoagulants: Secondary | ICD-10-CM | POA: Diagnosis not present

## 2023-09-07 DIAGNOSIS — M898X9 Other specified disorders of bone, unspecified site: Secondary | ICD-10-CM | POA: Diagnosis not present

## 2023-09-07 DIAGNOSIS — Z79624 Long term (current) use of inhibitors of nucleotide synthesis: Secondary | ICD-10-CM | POA: Diagnosis not present

## 2023-09-07 LAB — CBC WITH DIFFERENTIAL (CANCER CENTER ONLY)
Abs Immature Granulocytes: 0.05 10*3/uL (ref 0.00–0.07)
Basophils Absolute: 0 10*3/uL (ref 0.0–0.1)
Basophils Relative: 0 %
Eosinophils Absolute: 0 10*3/uL (ref 0.0–0.5)
Eosinophils Relative: 0 %
HCT: 34.4 % — ABNORMAL LOW (ref 39.0–52.0)
Hemoglobin: 11.8 g/dL — ABNORMAL LOW (ref 13.0–17.0)
Immature Granulocytes: 0 %
Lymphocytes Relative: 2 %
Lymphs Abs: 0.2 10*3/uL — ABNORMAL LOW (ref 0.7–4.0)
MCH: 32.2 pg (ref 26.0–34.0)
MCHC: 34.3 g/dL (ref 30.0–36.0)
MCV: 93.7 fL (ref 80.0–100.0)
Monocytes Absolute: 0.2 10*3/uL (ref 0.1–1.0)
Monocytes Relative: 2 %
Neutro Abs: 12.1 10*3/uL — ABNORMAL HIGH (ref 1.7–7.7)
Neutrophils Relative %: 96 %
Platelet Count: 195 10*3/uL (ref 150–400)
RBC: 3.67 MIL/uL — ABNORMAL LOW (ref 4.22–5.81)
RDW: 15.6 % — ABNORMAL HIGH (ref 11.5–15.5)
WBC Count: 12.6 10*3/uL — ABNORMAL HIGH (ref 4.0–10.5)
nRBC: 0 % (ref 0.0–0.2)

## 2023-09-07 LAB — CMP (CANCER CENTER ONLY)
ALT: 43 U/L (ref 0–44)
AST: 46 U/L — ABNORMAL HIGH (ref 15–41)
Albumin: 3.5 g/dL (ref 3.5–5.0)
Alkaline Phosphatase: 59 U/L (ref 38–126)
Anion gap: 6 (ref 5–15)
BUN: 6 mg/dL — ABNORMAL LOW (ref 8–23)
CO2: 30 mmol/L (ref 22–32)
Calcium: 8.2 mg/dL — ABNORMAL LOW (ref 8.9–10.3)
Chloride: 100 mmol/L (ref 98–111)
Creatinine: 0.72 mg/dL (ref 0.61–1.24)
GFR, Estimated: >60 mL/min (ref >60)
Glucose, Bld: 119 mg/dL — ABNORMAL HIGH (ref 70–99)
Potassium: 3.2 mmol/L — ABNORMAL LOW (ref 3.5–5.1)
Sodium: 136 mmol/L (ref 135–145)
Total Bilirubin: 0.7 mg/dL (ref 0.0–1.2)
Total Protein: 6.1 g/dL — ABNORMAL LOW (ref 6.5–8.1)

## 2023-09-07 MED ORDER — BORTEZOMIB CHEMO SQ INJECTION 3.5 MG (2.5MG/ML)
1.0000 mg/m2 | Freq: Once | INTRAMUSCULAR | Status: AC
  Administered 2023-09-07: 2.25 mg via SUBCUTANEOUS
  Filled 2023-09-07: qty 0.9

## 2023-09-07 MED ORDER — DEXAMETHASONE 4 MG PO TABS: ORAL_TABLET | ORAL | 2 refills | Status: DC

## 2023-09-07 MED ORDER — SODIUM CHLORIDE 0.9 % IV SOLN: INTRAVENOUS | Status: DC

## 2023-09-07 MED ORDER — LENALIDOMIDE 25 MG PO CAPS: 25.0000 mg | ORAL_CAPSULE | Freq: Every day | ORAL | 0 refills | Status: DC

## 2023-09-07 MED ORDER — ZOLEDRONIC ACID 4 MG/100ML IV SOLN
4.0000 mg | Freq: Once | INTRAVENOUS | Status: AC
  Administered 2023-09-07: 4 mg via INTRAVENOUS
  Filled 2023-09-07: qty 100

## 2023-09-07 MED ORDER — POTASSIUM CHLORIDE CRYS ER 20 MEQ PO TBCR: 20.0000 meq | EXTENDED_RELEASE_TABLET | Freq: Every day | ORAL | 1 refills | Status: DC

## 2023-09-07 NOTE — Telephone Encounter (Signed)
 Oral Oncology Patient Advocate Encounter   Received notification that prior authorization for Lenolidomide is required.   PA submitted on 09/07/23 Key [id number] Status is pending   Roda Cirri, CPhT [facility]ty Pharm[facility] Patient Advocate Phone: [phone] Fax: [phone]

## 2023-09-07 NOTE — Progress Notes (Signed)
 [facility] [phone]   [phone]  PROGRESS NOTE  Patient Care Team: Windell Hasty, DO as PCP - General (Internal Medicine) Knox Perl, MD as PCP - Cardiology (Cardiology)  Hematological/Oncological History #  IgG Kappa multiple myeloma.    ONCOLOGIC/HEMATOLOGIC HISTORY: 06/08/2023: Underwent CT CAP during admission for acute hypoxic respiratory failure in the setting of Influenza A and CAP vs aspiration pneumonia. Findings showed scattered bony lytic lesions in L 4th rib, pelvis including right ilium and thoracic spine.  06/09/2023: Labs collected during admission includes protein electrophoresis did not observe an M spike. UPEP 24 hour urine showed elevated free kappa light chains (611.43) and free lambda light chains (78.28) with a normal free kappa/lambda ratio.  07/13/2023: Established care with [facility] SPEP detected M protein measuring 0.3 g/dL. IFE showed IgG monoclonal protein with kappa light chain specificity. Serum free light chains showed elevated kappa light chain measuring 512.1, lambda light chain 20.3, ratio 25.23.  07/31/2023: Bone marrow biopsy revealed hypercellular bone marrow with plasma cell neoplasm representing 28% of all cells.  08/17/2023: Cycle 1, Day 1 of Velcade /Dex  Interval History:  David Mcgrath 73 y.o. male with medical history significant for IgG Kappa MM who presents for a follow up visit. The patient's last visit was on 08/31/2023. In the interim since the last visit he has had daily diarrhea.   On exam today David Mcgrath reports he has been having some trouble with his chemotherapy.  He reports predominantly his diarrhea, bloating, and fullness sensation.  This has been something his appetite and he has not been drinking as much water.  He reports that he has watery diarrhea on a daily basis.  This occurred after his last shot and treatment.  It did not occur during the prior treatments.  He  reports he is not having any nosebleeds, gum bleeding, or dark stools but reports he did have "a drop of blood in the urine".  The steroids have been causing him some insomnia on the day he received his treatment.  He is having some swelling in his lower extremities with +2 pitting edema.  He does have chronic neuropathy which has not worsened with the Velcade  shots.  He notes that he is willing to continue with the Velcade  at a reduced dose to try and improve the diarrhea.  Additionally he was agreeable to working on the addition of Revlimid.  He otherwise denies any fevers, chills, sweats, nausea, vomiting, or constipation.  A full 10 point ROS was otherwise negative.  MEDICAL HISTORY:  Past Medical History:  Diagnosis Date   Acute hypoxic respiratory failure (HCC) 06/04/2023   AKI (acute kidney injury) (HCC) 06/04/2023   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/04/2018   Depression 02/25/2014   Managed well with Prozac  20 mg po daily.  Patient reports he does not have symptoms as of 02/25/14.     Diarrhea 06/04/2023   Essential hypertension 06/04/2023   Fatty liver 06/04/2023   Flu 06/04/2023   Hyperlipidemia 06/04/2023   Ileus (HCC) 06/04/2023   Neuropathy    Paroxysmal atrial fibrillation (HCC) 07/04/2023   Pneumonia 06/04/2023    SURGICAL HISTORY: Past Surgical History:  Procedure Laterality Date   IR BONE MARROW BIOPSY & ASPIRATION  07/31/2023   NO PAST SURGERIES      SOCIAL HISTORY: Social History   Socioeconomic History   Marital status: Married    Spouse name: Nellie Banas   Number of children: 0  Years of education: Not on file   Highest education level: Not on file  Occupational History   Occupation: appraiser  Tobacco Use   Smoking status: Never   Smokeless tobacco: Never  Substance and Sexual Activity   Alcohol  use: Yes    Comment: socialy   Drug use: No   Sexual activity: Not on file  Other Topics Concern   Not on file  Social History Narrative   Lives  with spouse   Right handed   Drinks 4+ cups of caffeine daily   Retired    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Corporate investment banker Strain: Not on file  Food Insecurity: No Food Insecurity (06/04/2023)   Hunger Vital Sign    Worried About Running Out of Food in the Last Year: Never true    Ran Out of Food in the Last Year: Never true  Transportation Needs: No Transportation Needs (06/04/2023)   PRAPARE - Administrator, Civil Service (Medical): No    Lack of Transportation (Non-Medical): No  Physical Activity: Not on file  Stress: Not on file  Social Connections: Socially Integrated (06/04/2023)   Social Connection and Isolation Panel [NHANES]    Frequency of Communication with Friends and Family: More than three times a week    Frequency of Social Gatherings with Friends and Family: Twice a week    Attends Religious Services: More than 4 times per year    Active Member of [employer] or Organizations: Yes    Attends Engineer, structural: More than 4 times per year    Marital Status: Married  Catering manager Violence: Not At Risk (06/04/2023)   Humiliation, Afraid, Rape, and Kick questionnaire    Fear of Current or Ex-Partner: No    Emotionally Abused: No    Physically Abused: No    Sexually Abused: No    FAMILY HISTORY: Family History  Problem Relation Age of Onset   Diabetes Mother    Cancer Mother    Heart disease Mother    Cancer Father    Hyperlipidemia Father    Neuropathy Paternal Uncle    Migraines Neg Hx     ALLERGIES:  is allergic to shrimp [shellfish allergy].  MEDICATIONS:  Current Outpatient Medications  Medication Sig Dispense Refill   potassium chloride SA (KLOR-CON M) 20 MEQ tablet Take 1 tablet (20 mEq total) by mouth daily. 30 tablet 1   acyclovir  (ZOVIRAX ) 400 MG tablet Take 1 tablet (400 mg total) by mouth 2 (two) times daily. 60 tablet 3   allopurinol  (ZYLOPRIM ) 300 MG tablet Take 300 mg by mouth daily.     apixaban  (ELIQUIS ) 5 MG TABS tablet  Take 1 tablet (5 mg total) by mouth 2 (two) times daily. 60 tablet 12   Cholecalciferol (VITAMIN D-3) 125 MCG (5000 UT) TABS Take 5,000 Units by mouth daily.     Cyanocobalamin  (VITAMIN B12) 1000 MCG TBCR Take 1,000 mcg by mouth daily.     dexamethasone  (DECADRON ) 4 MG tablet Take 40 mg by mouth once a week on day of Velcade  injection. (10 tablets) 40 tablet 2   diphenoxylate -atropine  (LOMOTIL ) 2.5-0.025 MG tablet Take 1 tablet by mouth 4 (four) times daily as needed for diarrhea or loose stools. 120 tablet 0   ezetimibe (ZETIA) 10 MG tablet Take 10 mg by mouth every evening.     FLUoxetine  (PROZAC ) 20 MG capsule Take 20 mg by mouth daily.     fluticasone (FLONASE) 50 MCG/ACT nasal spray Place 1  spray into both nostrils daily.     gabapentin  (NEURONTIN ) 300 MG capsule Take 2 capsules (600 mg total) by mouth 2 (two) times daily. (Patient taking differently: Take 600 mg by mouth 3 (three) times daily.)     guaiFENesin  (MUCINEX ) 600 MG 12 hr tablet Take 600 mg by mouth daily.     guaifenesin  (ROBITUSSIN) 100 MG/5ML syrup Take 200 mg by mouth daily as needed for cough.     lenalidomide (REVLIMID) 25 MG capsule Take 1 capsule (25 mg total) by mouth daily. [employer] Auth #  [id number]    Date Obtained 09/07/2023 Take one daily for 21  days and then none for 7 days 21 capsule 0   melatonin 5 MG TABS Take 5 mg by mouth at bedtime.     omeprazole (PRILOSEC) 40 MG capsule Take 40 mg by mouth daily.     ondansetron  (ZOFRAN ) 8 MG tablet Take 1 tablet (8 mg total) by mouth every 8 (eight) hours as needed for nausea or vomiting. 30 tablet 1   pravastatin (PRAVACHOL) 80 MG tablet Take 80 mg by mouth at bedtime.     prochlorperazine  (COMPAZINE ) 10 MG tablet Take 1 tablet (10 mg total) by mouth every 6 (six) hours as needed for nausea or vomiting. 30 tablet 1   pyridoxine (B-6) 100 MG tablet Take 100 mg by mouth daily.     No current facility-administered medications for this visit.   Facility-Administered Medications  Ordered in Other Visits  Medication Dose Route Frequency Provider Last Rate Last Admin   0.9 %  sodium chloride  infusion   Intravenous Continuous Ander Bame, MD   Stopped at 09/07/23 1419    REVIEW OF SYSTEMS:   Constitutional: ( - ) fevers, ( - )  chills , ( - ) night sweats Eyes: ( - ) blurriness of vision, ( - ) double vision, ( - ) watery eyes Ears, nose, mouth, throat, and face: ( - ) mucositis, ( - ) sore throat Respiratory: ( - ) cough, ( - ) dyspnea, ( - ) wheezes Cardiovascular: ( - ) palpitation, ( - ) chest discomfort, ( - ) lower extremity swelling Gastrointestinal:  ( - ) nausea, ( - ) heartburn, ( - ) change in bowel habits Skin: ( - ) abnormal skin rashes Lymphatics: ( - ) new lymphadenopathy, ( - ) easy bruising Neurological: ( - ) numbness, ( - ) tingling, ( - ) new weaknesses Behavioral/Psych: ( - ) mood change, ( - ) new changes  All other systems were reviewed with the patient and are negative.  PHYSICAL EXAMINATION:  Vitals:   09/07/23 1206  BP: 104/62  Pulse: 87  Resp: 15  Temp: 97.7 F (36.5 C)  SpO2: 100%   Filed Weights   09/07/23 1206  Weight: 204 lb 8 oz (92.8 kg)    GENERAL: Well-appearing elderly Caucasian male, alert, no distress and comfortable SKIN: skin color, texture, turgor are normal, no rashes or significant lesions EYES: conjunctiva are pink and non-injected, sclera clear LUNGS: clear to auscultation and percussion with normal breathing effort HEART: regular rate & rhythm and no murmurs and no lower extremity edema Musculoskeletal: no cyanosis of digits and no clubbing  PSYCH: alert & oriented x 3, fluent speech NEURO: no focal motor/sensory deficits  LABORATORY DATA:  I have reviewed the data as listed    Latest Ref Rng & Units 09/07/2023   11:44 AM 08/31/2023    2:24 PM 08/23/2023   10:32 AM  CBC  WBC 4.0 - 10.5 K/uL 12.6  8.6  10.9   Hemoglobin 13.0 - 17.0 g/dL 16.1  09.6  04.5   Hematocrit 39.0 - 52.0 % 34.4  33.8  33.4    Platelets 150 - 400 K/uL 195  272  375        Latest Ref Rng & Units 09/07/2023   11:44 AM 08/31/2023    2:24 PM 08/23/2023   10:32 AM  CMP  Glucose 70 - 99 mg/dL 409  811  914   BUN 8 - 23 mg/dL 6  6  7    Creatinine 0.61 - 1.24 mg/dL 7.82  9.56  2.13   Sodium 135 - 145 mmol/L 136  136  138   Potassium 3.5 - 5.1 mmol/L 3.2  3.5  3.5   Chloride 98 - 111 mmol/L 100  102  103   CO2 22 - 32 mmol/L 30  29  29    Calcium 8.9 - 10.3 mg/dL 8.2  8.5  8.3   Total Protein 6.5 - 8.1 g/dL 6.1  6.1  5.6   Total Bilirubin 0.0 - 1.2 mg/dL 0.7  0.4  0.4   Alkaline Phos 38 - 126 U/L 59  63  52   AST 15 - 41 U/L 46  50  48   ALT 0 - 44 U/L 43  46  42     Lab Results  Component Value Date   MPROTEIN Not Observed 08/17/2023   MPROTEIN 0.3 (H) 07/13/2023   Lab Results  Component Value Date   KPAFRELGTCHN 347.1 (H) 08/17/2023   KPAFRELGTCHN 512.1 (H) 07/13/2023   LAMBDASER 22.3 08/17/2023   LAMBDASER 20.3 07/13/2023   KAPLAMBRATIO 15.57 (H) 08/17/2023   KAPLAMBRATIO 25.23 (H) 07/13/2023   KAPLAMBRATIO 7.81 06/09/2023   RADIOGRAPHIC STUDIES: No results found.  ASSESSMENT & PLAN David Mcgrath 73 y.o. male with medical history significant for IgG Kappa MM who presents for a follow up visit.   #IgG Kappa Multiple Myeloma: -Initially presented with scattered bony lytic lesions on CT CAP from 06/08/2023.  -Baseline labs from 07/13/2023 included SPEP detecting M protein measuring 0.3 g/dL. Kappa light chain elevated to 512.1, lambda light chain 20.3, ratio 25.23.  -Bone met survey from 07/20/2023 showed rxpansile lucent lesions in the right T1 transverse process, posterior right sixth rib, and left posteromedial fourth rib -Bone marrow biopsy confirmed plasma cell neoplasm measuring 28% of all cells.  -Recommend Velcade  plus Dexamethasone  therapy, started on 08/17/2023. Plan to add Revlimid during Cycle 2 if tolerable.  PLAN: --Due for Cycle 2, Day 1 of Velcade  plus Dexamethasone  today. Will  start process of adding revlimid.  --Labs from today were reviewed and adequate for treatment. WBC 12.6, Hgb 11.8, MCV 93.7, Plt 195, Creatinine normal.  --Proceed with weekly treatments, dropped Velcade  to 1 mg/m due to diarrhea --Currently patient is receiving 40 mg p.o. Dex on treatment days, can drop to 20 mg if he has persistent symptoms. --RTC in 2 weeks for a toxicity check   #Peripheral neuropathy: --Evaluated by neurology before MM diagnosis.  --EMG nerve conduction study done on 01/30/2020 by Dr. Salli Crawley confirms severe axonal sensorimotor polyneuropathy as well as moderate right carpal tunnel.  --Currently on gabapentin  600 mg TID.  --Monitor closely with initiation of velcade  as neuropathy can worsen.    #Supportive Care -- chemotherapy education to be scheduled  -- zofran  8mg  q8H PRN and compazine  10mg  PO q6H for nausea -- acyclovir  400mg  PO BID for  VCZ prophylaxis --  adding Zometa today, we have dental clearance on file.  -- no pain medication required at this time.    #Age related screenings -Colorectal screening UTD. -PSA from 07/13/2023 was normal at 0.4.   No orders of the defined types were placed in this encounter.   All questions were answered. The patient knows to call the clinic with any problems, questions or concerns.  A total of more than 30 minutes were spent on this encounter with face-to-face time and non-face-to-face time, including preparing to see the patient, ordering tests and/or medications, counseling the patient and coordination of care as outlined above.   Rogerio Clay, MD Department of Hematology/Oncology [facility] at [facility] Phone: [phone] Pager: [phone] Email: [email or web address]  09/07/2023 3:23 PM

## 2023-09-07 NOTE — Telephone Encounter (Signed)
 Oral Oncology Pharmacist Encounter  Received new prescription for Revlimid (lenalidomide) for the treatment of newly diagnosed IgG kappa multiple myeloma in conjunction with velcade  and dexamethasone , planned duration until disease progression or unacceptable toxicity. Patient has received one full cycle of velcade  plus dexamethasone .   Labs from 09/07/2023 (CBC and CMP) assessed, no interventions needed. Prescription dose and frequency assessed for appropriateness.   Patient is on eliquis  and will not need aspirin 81mg .   Current medication list in Epic reviewed, no significant/ relevant DDIs with Revlimid identified.   Evaluated chart and no patient barriers to medication adherence noted.   Patient agreement for treatment documented in MD note on 09/07/2023.  Prescription has been e-scribed to the [facility] for benefits analysis and approval.  Oral Oncology Clinic will continue to follow for insurance authorization, copayment issues, initial counseling and start date.  Dhairya Corales, PharmD Hematology/Oncology Clinical Pharmacist [facility] [phone] 09/07/2023 3:39 PM

## 2023-09-07 NOTE — Patient Instructions (Signed)
 [facility] - A DEPT OF [facility]  Discharge Instructions: Thank you for choosing [facility] to provide your oncology and hematology care.   If you have a lab appointment with the Cancer Center, please go directly to the Cancer Center and check in at the registration area.   Wear comfortable clothing and clothing appropriate for easy access to any Portacath or PICC line.   We strive to give you quality time with your provider. You may need to reschedule your appointment if you arrive late (15 or more minutes).  Arriving late affects you and other patients whose appointments are after yours.  Also, if you miss three or more appointments without notifying the office, you may be dismissed from the clinic at the provider's discretion.      For prescription refill requests, have your pharmacy contact our office and allow 72 hours for refills to be completed.    Today you received the following chemotherapy and/or immunotherapy agents Velcade  and Zometa       To help prevent nausea and vomiting after your treatment, we encourage you to take your nausea medication as directed.  BELOW ARE SYMPTOMS THAT SHOULD BE REPORTED IMMEDIATELY: *FEVER GREATER THAN 100.4 F (38 C) OR HIGHER *CHILLS OR SWEATING *NAUSEA AND VOMITING THAT IS NOT CONTROLLED WITH YOUR NAUSEA MEDICATION *UNUSUAL SHORTNESS OF BREATH *UNUSUAL BRUISING OR BLEEDING *URINARY PROBLEMS (pain or burning when urinating, or frequent urination) *BOWEL PROBLEMS (unusual diarrhea, constipation, pain near the anus) TENDERNESS IN MOUTH AND THROAT WITH OR WITHOUT PRESENCE OF ULCERS (sore throat, sores in mouth, or a toothache) UNUSUAL RASH, SWELLING OR PAIN  UNUSUAL VAGINAL DISCHARGE OR ITCHING   Items with * indicate a potential emergency and should be followed up as soon as possible or go to the Emergency Department if any problems should occur.  Please show the CHEMOTHERAPY ALERT CARD or  IMMUNOTHERAPY ALERT CARD at check-in to the Emergency Department and triage nurse.  Should you have questions after your visit or need to cancel or reschedule your appointment, please contact [facility] - A DEPT OF [facility]  Dept: [phone]  and follow the prompts.  Office hours are 8:00 a.m. to 4:30 p.m. Monday - Friday. Please note that voicemails left after 4:00 p.m. may not be returned until the following business day.  We are closed weekends and major holidays. You have access to a nurse at all times for urgent questions. Please call the main number to the clinic Dept: [phone] and follow the prompts.   For any non-urgent questions, you may also contact your provider using MyChart. We now offer e-Visits for anyone 26 and older to request care online for non-urgent symptoms. For details visit [email or web address].   Also download the MyChart app! Go to the app store, search "MyChart", open the app, select [facility], and log in with your MyChart username and password.

## 2023-09-07 NOTE — Progress Notes (Signed)
 I was asked by nursing to see patient due to severe diarrhea.  This RD unable to see patient today.  I did provide him with a fact sheet on foods to eat with diarrhea and have arranged a telephone visit tomorrow.  Patient's wife provided me with a preferred phone number to contact them.  Patient added to schedule.

## 2023-09-08 ENCOUNTER — Inpatient Hospital Stay: Admitting: Nutrition

## 2023-09-08 ENCOUNTER — Telehealth: Payer: Self-pay

## 2023-09-08 ENCOUNTER — Other Ambulatory Visit (HOSPITAL_COMMUNITY): Payer: Self-pay

## 2023-09-08 LAB — KAPPA/LAMBDA LIGHT CHAINS
Kappa free light chain: 41.8 mg/L — ABNORMAL HIGH (ref 3.3–19.4)
Kappa, lambda light chain ratio: 2.4 — ABNORMAL HIGH (ref 0.26–1.65)
Lambda free light chains: 17.4 mg/L (ref 5.7–26.3)

## 2023-09-08 MED ORDER — LENALIDOMIDE 25 MG PO CAPS: 25.0000 mg | ORAL_CAPSULE | Freq: Every day | ORAL | 0 refills | Status: DC

## 2023-09-08 NOTE — Progress Notes (Signed)
 Telephone call per RN referral for diarrhea. I spoke with patient's wife.  73 year old male diagnosed with multiple myeloma and followed by Dr. Rosaline Coma.  He is receiving Velcade .  Received cycle 1, day 1 on May 15.  Plan to add Revlimid during cycle 2 if tolerable.  Past medical history includes AKI, depression, diarrhea, essential hypertension, hyperlipidemia, ileus, and atrial fibrillation.  Medications include Zofran , Zometa, vitamin D3, vitamin B12, Decadron , Lomotil , Prilosec, Compazine , and vitamin B6.  Labs include glucose 119, BUN 6, and potassium 3.2.  Height: 6 feet 4 inches. Weight: 204 pounds 8 ounces June 5. BMI: 24.89.  Patient weighed 250 pounds 4.8 ounces on June 07, 2023. 18% weight loss in 3 months/clinically significant. Expect severe malnutrition due to significant weight loss, muscle, fat loss and edema.  06/08/2023: Underwent CT CAP during admission for acute hypoxic respiratory failure in the setting of Influenza A and CAP vs aspiration pneumonia. Findings showed scattered bony lytic lesions in L 4th rib, pelvis including right ilium and thoracic spine.   Patient reported watery diarrhea on a daily basis along with bloating and a fullness sensation.  Noted to have 2+ pitting edema.  Patient lost 38 of the 46 pounds during hospitalization/rehab per wife.  He is currently tolerating a bland diet and has incorporated bananas and applesauce to help thicken stool.  He had his first watery stool of the day prior to this phone call.  Nutrition diagnosis: Unintentional weight loss related to multiple myeloma as evidenced by 18% weight loss over 3 months which is clinically significant.  Intervention: Educated on bland, low fiber diet.  Nutrition fact sheet was given to patient yesterday. Encourage foods to help thicken stool. Increase clear liquids as tolerated.  Try Ensure clear to supplement calories and protein.  Will provide coupons. Recommend adding Banatrol plus, twice  a day as tolerated.  Will provide samples. Continue whey protein powder as tolerated.   Replace regular milk with lactose-free milk such as fair life milk.  Monitoring, evaluation, goals: Patient will tolerate oral diet with improvement in diarrhea to minimize further weight loss.  Next visit: Thursday, July 3, during infusion.  I encouraged patient/wife to contact RD with questions or concerns as needed.  She has my contact information.  **Disclaimer: This note was dictated with voice recognition software. Similar sounding words can inadvertently be transcribed and this note may contain transcription errors which may not have been corrected upon publication of note.**

## 2023-09-08 NOTE — Telephone Encounter (Addendum)
 Oral Oncology Patient Advocate Encounter  Prior Authorization for Lenalidomide has been approved.    PA# [id number]  Effective dates: 09/07/23 through 09/06/24  Patients co-pay is $1,266.60.   Lenalidomide is a REMS medication and must be filled through [facility].   Script and all supporting documents sent to [facility] for processing and fulfillment.   Hansel Ley, CPhT Oncology Pharmacy Patient Advocate  [facility]  [phone] (phone) [phone] (fax) 09/08/2023 7:59 AM

## 2023-09-08 NOTE — Telephone Encounter (Signed)
 Oral Oncology Patient Advocate Encounter  Was successful in securing patient a $8,000.00 grant from [facility] to provide copayment coverage for Lenalidomide.  This will keep the out of pocket expense at $0.     [employer] ID: [id number]   The billing information is as follows and has been shared with [facility].    RxBin: [id number] PCN: [id number] Member ID: [id number] Group ID: [id number] Dates of Eligibility: 08/09/23 through 08/07/24  Fund:  Multiple Myeloma - Medicare Access   David Mcgrath, CPhT Pharmacy Technician Coordinator [facility] [phone] (Ph) 09/08/2023 8:08 AM

## 2023-09-11 ENCOUNTER — Telehealth: Payer: Self-pay | Admitting: *Deleted

## 2023-09-11 LAB — MULTIPLE MYELOMA PANEL, SERUM
Albumin SerPl Elph-Mcnc: 2.8 g/dL — ABNORMAL LOW (ref 2.9–4.4)
Albumin/Glob SerPl: 1.1 (ref 0.7–1.7)
Alpha 1: 0.4 g/dL (ref 0.0–0.4)
Alpha2 Glob SerPl Elph-Mcnc: 1 g/dL (ref 0.4–1.0)
B-Globulin SerPl Elph-Mcnc: 0.6 g/dL — ABNORMAL LOW (ref 0.7–1.3)
Gamma Glob SerPl Elph-Mcnc: 0.6 g/dL (ref 0.4–1.8)
Globulin, Total: 2.6 g/dL (ref 2.2–3.9)
IgA: 57 mg/dL — ABNORMAL LOW (ref 61–437)
IgG (Immunoglobin G), Serum: 644 mg/dL (ref 603–1613)
IgM (Immunoglobulin M), Srm: 54 mg/dL (ref 15–143)
Total Protein ELP: 5.4 g/dL — ABNORMAL LOW (ref 6.0–8.5)

## 2023-09-11 NOTE — Telephone Encounter (Signed)
 Received call from pt's wife, Paola Bohr.  She states that her husband is feeling much better today and over the weekend. He has not had any diarrhea after his last treatment (he got  dose reduced Velcade ). He tolerated this much better.  Paola Bohr also states they will be getting his Revlimid  tomorrow. She wanted to know when he needs to start this. Advised that he can start the morning of his next treatment which will be 09/14/23.  They also asked if he could decrease his weekly steroids from 40 mg to 20 mg. Discussed this with Dr. Rosaline Coma and he is agreeable to this. Paola Bohr voiced understanding of this medication change. No other questions or concerns.

## 2023-09-12 DIAGNOSIS — G603 Idiopathic progressive neuropathy: Secondary | ICD-10-CM | POA: Diagnosis not present

## 2023-09-12 DIAGNOSIS — G4733 Obstructive sleep apnea (adult) (pediatric): Secondary | ICD-10-CM | POA: Diagnosis not present

## 2023-09-12 DIAGNOSIS — I10 Essential (primary) hypertension: Secondary | ICD-10-CM | POA: Diagnosis not present

## 2023-09-12 DIAGNOSIS — K219 Gastro-esophageal reflux disease without esophagitis: Secondary | ICD-10-CM | POA: Diagnosis not present

## 2023-09-12 DIAGNOSIS — K760 Fatty (change of) liver, not elsewhere classified: Secondary | ICD-10-CM | POA: Diagnosis not present

## 2023-09-12 DIAGNOSIS — M109 Gout, unspecified: Secondary | ICD-10-CM | POA: Diagnosis not present

## 2023-09-12 DIAGNOSIS — F419 Anxiety disorder, unspecified: Secondary | ICD-10-CM | POA: Diagnosis not present

## 2023-09-12 DIAGNOSIS — E785 Hyperlipidemia, unspecified: Secondary | ICD-10-CM | POA: Diagnosis not present

## 2023-09-12 DIAGNOSIS — Z85828 Personal history of other malignant neoplasm of skin: Secondary | ICD-10-CM | POA: Diagnosis not present

## 2023-09-12 DIAGNOSIS — Z7901 Long term (current) use of anticoagulants: Secondary | ICD-10-CM | POA: Diagnosis not present

## 2023-09-12 DIAGNOSIS — M899 Disorder of bone, unspecified: Secondary | ICD-10-CM | POA: Diagnosis not present

## 2023-09-12 NOTE — Telephone Encounter (Signed)
 Received notification from [facility] that medication is scheduled to be delivered to patient today, 09/12/23. Patient will start taking Lenalidomide  on 09/14/23.    Hansel Ley, CPhT Supervisor Pharmacy Patient Advocate [facility] [phone] (Ph) 09/12/2023 9:38 AM

## 2023-09-13 DIAGNOSIS — M899 Disorder of bone, unspecified: Secondary | ICD-10-CM | POA: Diagnosis not present

## 2023-09-13 DIAGNOSIS — I10 Essential (primary) hypertension: Secondary | ICD-10-CM | POA: Diagnosis not present

## 2023-09-13 DIAGNOSIS — Z85828 Personal history of other malignant neoplasm of skin: Secondary | ICD-10-CM | POA: Diagnosis not present

## 2023-09-13 DIAGNOSIS — G603 Idiopathic progressive neuropathy: Secondary | ICD-10-CM | POA: Diagnosis not present

## 2023-09-13 DIAGNOSIS — K760 Fatty (change of) liver, not elsewhere classified: Secondary | ICD-10-CM | POA: Diagnosis not present

## 2023-09-13 DIAGNOSIS — Z7901 Long term (current) use of anticoagulants: Secondary | ICD-10-CM | POA: Diagnosis not present

## 2023-09-13 DIAGNOSIS — M109 Gout, unspecified: Secondary | ICD-10-CM | POA: Diagnosis not present

## 2023-09-13 DIAGNOSIS — F419 Anxiety disorder, unspecified: Secondary | ICD-10-CM | POA: Diagnosis not present

## 2023-09-13 DIAGNOSIS — E785 Hyperlipidemia, unspecified: Secondary | ICD-10-CM | POA: Diagnosis not present

## 2023-09-13 DIAGNOSIS — G4733 Obstructive sleep apnea (adult) (pediatric): Secondary | ICD-10-CM | POA: Diagnosis not present

## 2023-09-13 DIAGNOSIS — K219 Gastro-esophageal reflux disease without esophagitis: Secondary | ICD-10-CM | POA: Diagnosis not present

## 2023-09-14 ENCOUNTER — Inpatient Hospital Stay

## 2023-09-14 ENCOUNTER — Encounter: Payer: Self-pay | Admitting: Hematology and Oncology

## 2023-09-14 VITALS — BP 133/71 | HR 84 | Temp 97.8°F | Resp 16 | Ht 76.0 in | Wt 223.8 lb

## 2023-09-14 DIAGNOSIS — C9000 Multiple myeloma not having achieved remission: Secondary | ICD-10-CM

## 2023-09-14 DIAGNOSIS — Z7961 Long term (current) use of immunomodulator: Secondary | ICD-10-CM | POA: Diagnosis not present

## 2023-09-14 DIAGNOSIS — Z79624 Long term (current) use of inhibitors of nucleotide synthesis: Secondary | ICD-10-CM | POA: Diagnosis not present

## 2023-09-14 DIAGNOSIS — Z79899 Other long term (current) drug therapy: Secondary | ICD-10-CM | POA: Diagnosis not present

## 2023-09-14 DIAGNOSIS — Z5112 Encounter for antineoplastic immunotherapy: Secondary | ICD-10-CM | POA: Diagnosis not present

## 2023-09-14 DIAGNOSIS — Z7901 Long term (current) use of anticoagulants: Secondary | ICD-10-CM | POA: Diagnosis not present

## 2023-09-14 DIAGNOSIS — G629 Polyneuropathy, unspecified: Secondary | ICD-10-CM | POA: Diagnosis not present

## 2023-09-14 LAB — CMP (CANCER CENTER ONLY)
ALT: 35 U/L (ref 0–44)
AST: 44 U/L — ABNORMAL HIGH (ref 15–41)
Albumin: 2.6 g/dL — ABNORMAL LOW (ref 3.5–5.0)
Alkaline Phosphatase: 56 U/L (ref 38–126)
Anion gap: 12 (ref 5–15)
BUN: 7 mg/dL — ABNORMAL LOW (ref 8–23)
CO2: 21 mmol/L — ABNORMAL LOW (ref 22–32)
Calcium: 6.6 mg/dL — ABNORMAL LOW (ref 8.9–10.3)
Chloride: 100 mmol/L (ref 98–111)
Creatinine: 0.62 mg/dL (ref 0.61–1.24)
GFR, Estimated: >60 mL/min (ref >60)
Glucose, Bld: 217 mg/dL — ABNORMAL HIGH (ref 70–99)
Potassium: 3.6 mmol/L (ref 3.5–5.1)
Sodium: 133 mmol/L — ABNORMAL LOW (ref 135–145)
Total Bilirubin: 0.4 mg/dL (ref 0.0–1.2)
Total Protein: 5.1 g/dL — ABNORMAL LOW (ref 6.5–8.1)

## 2023-09-14 LAB — CBC WITH DIFFERENTIAL (CANCER CENTER ONLY)
Abs Immature Granulocytes: 0.02 10*3/uL (ref 0.00–0.07)
Basophils Absolute: 0 10*3/uL (ref 0.0–0.1)
Basophils Relative: 0 %
Eosinophils Absolute: 0 10*3/uL (ref 0.0–0.5)
Eosinophils Relative: 0 %
HCT: 30.5 % — ABNORMAL LOW (ref 39.0–52.0)
Hemoglobin: 10.8 g/dL — ABNORMAL LOW (ref 13.0–17.0)
Immature Granulocytes: 0 %
Lymphocytes Relative: 3 %
Lymphs Abs: 0.2 10*3/uL — ABNORMAL LOW (ref 0.7–4.0)
MCH: 32.5 pg (ref 26.0–34.0)
MCHC: 35.4 g/dL (ref 30.0–36.0)
MCV: 91.9 fL (ref 80.0–100.0)
Monocytes Absolute: 0.1 10*3/uL (ref 0.1–1.0)
Monocytes Relative: 2 %
Neutro Abs: 6.1 10*3/uL (ref 1.7–7.7)
Neutrophils Relative %: 95 %
Platelet Count: 212 10*3/uL (ref 150–400)
RBC: 3.32 MIL/uL — ABNORMAL LOW (ref 4.22–5.81)
RDW: 15.6 % — ABNORMAL HIGH (ref 11.5–15.5)
WBC Count: 6.4 10*3/uL (ref 4.0–10.5)
nRBC: 0 % (ref 0.0–0.2)

## 2023-09-14 MED ORDER — BORTEZOMIB CHEMO SQ INJECTION 3.5 MG (2.5MG/ML)
1.0000 mg/m2 | Freq: Once | INTRAMUSCULAR | Status: AC
  Administered 2023-09-14: 2.25 mg via SUBCUTANEOUS
  Filled 2023-09-14: qty 0.9

## 2023-09-14 NOTE — Patient Instructions (Signed)
 [facility] - A DEPT OF [facility]  Discharge Instructions: Thank you for choosing [facility] to provide your oncology and hematology care.   If you have a lab appointment with the Cancer Center, please go directly to the Cancer Center and check in at the registration area.   Wear comfortable clothing and clothing appropriate for easy access to any Portacath or PICC line.   We strive to give you quality time with your provider. You may need to reschedule your appointment if you arrive late (15 or more minutes).  Arriving late affects you and other patients whose appointments are after yours.  Also, if you miss three or more appointments without notifying the office, you may be dismissed from the clinic at the provider's discretion.      For prescription refill requests, have your pharmacy contact our office and allow 72 hours for refills to be completed.    Today you received the following chemotherapy and/or immunotherapy agent:Bortezomib (Velcade)     To help prevent nausea and vomiting after your treatment, we encourage you to take your nausea medication as directed.  BELOW ARE SYMPTOMS THAT SHOULD BE REPORTED IMMEDIATELY: *FEVER GREATER THAN 100.4 F (38 C) OR HIGHER *CHILLS OR SWEATING *NAUSEA AND VOMITING THAT IS NOT CONTROLLED WITH YOUR NAUSEA MEDICATION *UNUSUAL SHORTNESS OF BREATH *UNUSUAL BRUISING OR BLEEDING *URINARY PROBLEMS (pain or burning when urinating, or frequent urination) *BOWEL PROBLEMS (unusual diarrhea, constipation, pain near the anus) TENDERNESS IN MOUTH AND THROAT WITH OR WITHOUT PRESENCE OF ULCERS (sore throat, sores in mouth, or a toothache) UNUSUAL RASH, SWELLING OR PAIN  UNUSUAL VAGINAL DISCHARGE OR ITCHING   Items with * indicate a potential emergency and should be followed up as soon as possible or go to the Emergency Department if any problems should occur.  Please show the CHEMOTHERAPY ALERT CARD or  IMMUNOTHERAPY ALERT CARD at check-in to the Emergency Department and triage nurse.  Should you have questions after your visit or need to cancel or reschedule your appointment, please contact [facility] - A DEPT OF [facility]  Dept: [phone]  and follow the prompts.  Office hours are 8:00 a.m. to 4:30 p.m. Monday - Friday. Please note that voicemails left after 4:00 p.m. may not be returned until the following business day.  We are closed weekends and major holidays. You have access to a nurse at all times for urgent questions. Please call the main number to the clinic Dept: [phone] and follow the prompts.   For any non-urgent questions, you may also contact your provider using MyChart. We now offer e-Visits for anyone 67 and older to request care online for non-urgent symptoms. For details visit [email or web address].   Also download the MyChart app! Go to the app store, search "MyChart", open the app, select [facility], and log in with your MyChart username and password.  Bortezomib Injection What is this medication? BORTEZOMIB (bor TEZ oh mib) treats lymphoma. It may also be used to treat multiple myeloma, a type of bone marrow cancer. It works by blocking a protein that causes cancer cells to grow and multiply. This helps to slow or stop the spread of cancer cells. This medicine may be used for other purposes; ask your health care provider or pharmacist if you have questions. COMMON BRAND NAME(S): BORUZU, Velcade What should I tell my care team before I take this medication? They need to know  if you have any of these conditions: Dehydration Diabetes Heart disease Liver disease Tingling of the fingers or toes or other nerve disorder An unusual or allergic reaction to bortezomib, other medications, foods, dyes, or preservatives If you or your partner are pregnant or trying to get pregnant Breastfeeding How should I use this medication? This  medication is injected into a vein or under the skin. It is given by your care team in a hospital or clinic setting. Talk to your care team about the use of this medication in children. Special care may be needed. Overdosage: If you think you have taken too much of this medicine contact a poison control center or emergency room at once. NOTE: This medicine is only for you. Do not share this medicine with others. What if I miss a dose? Keep appointments for follow-up doses. It is important not to miss your dose. Call your care team if you are unable to keep an appointment. What may interact with this medication? Ketoconazole Rifampin This list may not describe all possible interactions. Give your health care provider a list of all the medicines, herbs, non-prescription drugs, or dietary supplements you use. Also tell them if you smoke, drink alcohol, or use illegal drugs. Some items may interact with your medicine. What should I watch for while using this medication? Your condition will be monitored carefully while you are receiving this medication. You may need blood work while taking this medication. This medication may affect your coordination, reaction time, or judgment. Do not drive or operate machinery until you know how this medication affects you. Sit up or stand slowly to reduce the risk of dizzy or fainting spells. Drinking alcohol with this medication can increase the risk of these side effects. This medication may increase your risk of getting an infection. Call your care team for advice if you get a fever, chills, sore throat, or other symptoms of a cold or flu. Do not treat yourself. Try to avoid being around people who are sick. Check with your care team if you have severe diarrhea, nausea, and vomiting, or if you sweat a lot. The loss of too much body fluid may make it dangerous for you to take this medication. Talk to your care team if you may be pregnant. Serious birth defects can  occur if you take this medication during pregnancy and for 7 months after the last dose. You will need a negative pregnancy test before starting this medication. Contraception is recommended while taking this medication and for 7 months after the last dose. Your care team can help you find the option that works for you. If your partner can get pregnant, use a condom during sex while taking this medication and for 4 months after the last dose. Do not breastfeed while taking this medication and for 2 months after the last dose. This medication may cause infertility. Talk to your care team if you are concerned about your fertility. What side effects may I notice from receiving this medication? Side effects that you should report to your care team as soon as possible: Allergic reactions--skin rash, itching, hives, swelling of the face, lips, tongue, or throat Bleeding--bloody or black, tar-like stools, vomiting blood or brown material that looks like coffee grounds, red or dark brown urine, small red or purple spots on skin, unusual bruising or bleeding Bleeding in the brain--severe headache, stiff neck, confusion, dizziness, change in vision, numbness or weakness of the face, arm, or leg, trouble speaking, trouble walking, vomiting  Bowel blockage--stomach cramping, unable to have a bowel movement or pass gas, loss of appetite, vomiting Heart failure--shortness of breath, swelling of the ankles, feet, or hands, sudden weight gain, unusual weakness or fatigue Infection--fever, chills, cough, sore throat, wounds that don't heal, pain or trouble when passing urine, general feeling of discomfort or being unwell Liver injury--right upper belly pain, loss of appetite, nausea, light-colored stool, dark yellow or brown urine, yellowing skin or eyes, unusual weakness or fatigue Low blood pressure--dizziness, feeling faint or lightheaded, blurry vision Lung injury--shortness of breath or trouble breathing, cough,  spitting up blood, chest pain, fever Pain, tingling, or numbness in the hands or feet Severe or prolonged diarrhea Stomach pain, bloody diarrhea, pale skin, unusual weakness or fatigue, decrease in the amount of urine, which may be signs of hemolytic uremic syndrome Sudden and severe headache, confusion, change in vision, seizures, which may be signs of posterior reversible encephalopathy syndrome (PRES) TTP--purple spots on the skin or inside the mouth, pale skin, yellowing skin or eyes, unusual weakness or fatigue, fever, fast or irregular heartbeat, confusion, change in vision, trouble speaking, trouble walking Tumor lysis syndrome (TLS)--nausea, vomiting, diarrhea, decrease in the amount of urine, dark urine, unusual weakness or fatigue, confusion, muscle pain or cramps, fast or irregular heartbeat, joint pain Side effects that usually do not require medical attention (report to your care team if they continue or are bothersome): Constipation Diarrhea Fatigue Loss of appetite Nausea This list may not describe all possible side effects. Call your doctor for medical advice about side effects. You may report side effects to FDA at 1-800-FDA-1088. Where should I keep my medication? This medication is given in a hospital or clinic. It will not be stored at home. NOTE: This sheet is a summary. It may not cover all possible information. If you have questions about this medicine, talk to your doctor, pharmacist, or health care provider.  2024 Elsevier/Gold Standard (2021-08-24 00:00:00)

## 2023-09-14 NOTE — Progress Notes (Signed)
 Pt. states he took Decadron  20 mg po at home today. Pt. has bilateral lower extremity pitting edema +2. States he has had for last 2 days, new onset, and denies any shortness of breath. No redness noted and not warm to touch. Increase in weight noted today from 09/07/23 approximately 18 lb increase. Polly Brink, [place] notified and in for observation/assessment. Pt. instructed to elevate lower extremities at home above heart and notify MD for increase in swelling. Pt. and spouse state they understand.

## 2023-09-14 NOTE — Telephone Encounter (Signed)
 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   Counseled patient on administration, dosing, side effects, monitoring, drug-food interactions, safe handling, storage, and disposal.  Patient will take Revlimid  25mg  capsules, 1 capsule by mouth once daily, without regard to food, with a full glass of water. Revlimid  will be given 21 days on, 7 days off, repeat every 28 days.  Revlimid  start date: 09/14/2023  Adverse effects of Revlimid  include but are not limited to: nausea, constipation, diarrhea, abdominal pain, rash, fatigue, drug fever, and decreased blood counts.    Reviewed with patient importance of keeping a medication schedule and plan for any missed doses. No barriers to medication adherence identified.  Medication reconciliation performed and medication/allergy list updated.  Insurance authorization for Revlimid  has been obtained. Revlimid  prescription is being dispensed from [facility] as it is a limited distribution medication.  All questions answered. Patient voiced understanding and appreciation.   Medication education handout placed in mail for patient. Patient knows to call the office with questions or concerns. Oral Chemotherapy Clinic phone number provided to patient.   David Mcgrath, PharmD Hematology/Oncology Clinical Pharmacist [facility] [phone] 09/14/2023    3:40 PM

## 2023-09-14 NOTE — Progress Notes (Signed)
 Ok to proceed with Velcade  CMET pending per Dr Rosaline Coma.

## 2023-09-20 ENCOUNTER — Telehealth: Payer: Self-pay | Admitting: *Deleted

## 2023-09-20 ENCOUNTER — Other Ambulatory Visit: Payer: Self-pay | Admitting: *Deleted

## 2023-09-20 DIAGNOSIS — M899 Disorder of bone, unspecified: Secondary | ICD-10-CM | POA: Diagnosis not present

## 2023-09-20 DIAGNOSIS — G603 Idiopathic progressive neuropathy: Secondary | ICD-10-CM | POA: Diagnosis not present

## 2023-09-20 DIAGNOSIS — E785 Hyperlipidemia, unspecified: Secondary | ICD-10-CM | POA: Diagnosis not present

## 2023-09-20 DIAGNOSIS — I10 Essential (primary) hypertension: Secondary | ICD-10-CM | POA: Diagnosis not present

## 2023-09-20 DIAGNOSIS — F419 Anxiety disorder, unspecified: Secondary | ICD-10-CM | POA: Diagnosis not present

## 2023-09-20 DIAGNOSIS — K760 Fatty (change of) liver, not elsewhere classified: Secondary | ICD-10-CM | POA: Diagnosis not present

## 2023-09-20 DIAGNOSIS — K219 Gastro-esophageal reflux disease without esophagitis: Secondary | ICD-10-CM | POA: Diagnosis not present

## 2023-09-20 DIAGNOSIS — Z7901 Long term (current) use of anticoagulants: Secondary | ICD-10-CM | POA: Diagnosis not present

## 2023-09-20 DIAGNOSIS — C9000 Multiple myeloma not having achieved remission: Secondary | ICD-10-CM

## 2023-09-20 DIAGNOSIS — Z85828 Personal history of other malignant neoplasm of skin: Secondary | ICD-10-CM | POA: Diagnosis not present

## 2023-09-20 DIAGNOSIS — G4733 Obstructive sleep apnea (adult) (pediatric): Secondary | ICD-10-CM | POA: Diagnosis not present

## 2023-09-20 DIAGNOSIS — M109 Gout, unspecified: Secondary | ICD-10-CM | POA: Diagnosis not present

## 2023-09-20 NOTE — Telephone Encounter (Signed)
 Received vm message from pt's wife asking for callback. Her husband is having dysuria when he voids.  TCT Paola Bohr and spoke to her. Advised that we will get a urine specimen tomorrow when he comes in for labs. Encouraged increase fluid intake-water. She also states he is having more diarrhea again this week. Advised that  she can discuss this with Dr. Rosaline Coma tomorrow when they come in for labs/clinic visit and his Velcade .. Paola Bohr is asking if there is another treatment available that does not cause this much diarrhea.  Advised that there is and this can also be discussed at tomorrow's visit. She voiced understanding.

## 2023-09-21 ENCOUNTER — Inpatient Hospital Stay: Admitting: Dietician

## 2023-09-21 ENCOUNTER — Inpatient Hospital Stay

## 2023-09-21 ENCOUNTER — Other Ambulatory Visit: Payer: Self-pay | Admitting: *Deleted

## 2023-09-21 ENCOUNTER — Inpatient Hospital Stay: Admitting: Hematology and Oncology

## 2023-09-21 VITALS — BP 125/56 | HR 57 | Temp 97.8°F | Resp 17 | Ht 76.0 in | Wt 211.3 lb

## 2023-09-21 DIAGNOSIS — C9000 Multiple myeloma not having achieved remission: Secondary | ICD-10-CM

## 2023-09-21 DIAGNOSIS — M898X9 Other specified disorders of bone, unspecified site: Secondary | ICD-10-CM

## 2023-09-21 DIAGNOSIS — G629 Polyneuropathy, unspecified: Secondary | ICD-10-CM | POA: Diagnosis not present

## 2023-09-21 DIAGNOSIS — Z7901 Long term (current) use of anticoagulants: Secondary | ICD-10-CM | POA: Diagnosis not present

## 2023-09-21 DIAGNOSIS — Z7961 Long term (current) use of immunomodulator: Secondary | ICD-10-CM | POA: Diagnosis not present

## 2023-09-21 DIAGNOSIS — Z79899 Other long term (current) drug therapy: Secondary | ICD-10-CM | POA: Diagnosis not present

## 2023-09-21 DIAGNOSIS — Z5112 Encounter for antineoplastic immunotherapy: Secondary | ICD-10-CM | POA: Diagnosis not present

## 2023-09-21 DIAGNOSIS — Z79624 Long term (current) use of inhibitors of nucleotide synthesis: Secondary | ICD-10-CM | POA: Diagnosis not present

## 2023-09-21 LAB — CBC WITH DIFFERENTIAL (CANCER CENTER ONLY)
Abs Immature Granulocytes: 0.09 10*3/uL — ABNORMAL HIGH (ref 0.00–0.07)
Basophils Absolute: 0 10*3/uL (ref 0.0–0.1)
Basophils Relative: 0 %
Eosinophils Absolute: 0 10*3/uL (ref 0.0–0.5)
Eosinophils Relative: 0 %
HCT: 31.8 % — ABNORMAL LOW (ref 39.0–52.0)
Hemoglobin: 11.1 g/dL — ABNORMAL LOW (ref 13.0–17.0)
Immature Granulocytes: 1 %
Lymphocytes Relative: 3 %
Lymphs Abs: 0.3 10*3/uL — ABNORMAL LOW (ref 0.7–4.0)
MCH: 32.1 pg (ref 26.0–34.0)
MCHC: 34.9 g/dL (ref 30.0–36.0)
MCV: 91.9 fL (ref 80.0–100.0)
Monocytes Absolute: 0.2 10*3/uL (ref 0.1–1.0)
Monocytes Relative: 2 %
Neutro Abs: 11.1 10*3/uL — ABNORMAL HIGH (ref 1.7–7.7)
Neutrophils Relative %: 94 %
Platelet Count: 197 10*3/uL (ref 150–400)
RBC: 3.46 MIL/uL — ABNORMAL LOW (ref 4.22–5.81)
RDW: 15.4 % (ref 11.5–15.5)
WBC Count: 11.7 10*3/uL — ABNORMAL HIGH (ref 4.0–10.5)
nRBC: 0 % (ref 0.0–0.2)

## 2023-09-21 LAB — CMP (CANCER CENTER ONLY)
ALT: 33 U/L (ref 0–44)
AST: 32 U/L (ref 15–41)
Albumin: 2.9 g/dL — ABNORMAL LOW (ref 3.5–5.0)
Alkaline Phosphatase: 62 U/L (ref 38–126)
Anion gap: 9 (ref 5–15)
BUN: 5 mg/dL — ABNORMAL LOW (ref 8–23)
CO2: 24 mmol/L (ref 22–32)
Calcium: 6 mg/dL — CL (ref 8.9–10.3)
Chloride: 103 mmol/L (ref 98–111)
Creatinine: 0.66 mg/dL (ref 0.61–1.24)
GFR, Estimated: >60 mL/min (ref >60)
Glucose, Bld: 156 mg/dL — ABNORMAL HIGH (ref 70–99)
Potassium: 3 mmol/L — ABNORMAL LOW (ref 3.5–5.1)
Sodium: 136 mmol/L (ref 135–145)
Total Bilirubin: 0.4 mg/dL (ref 0.0–1.2)
Total Protein: 5.1 g/dL — ABNORMAL LOW (ref 6.5–8.1)

## 2023-09-21 LAB — URINALYSIS, COMPLETE (UACMP) WITH MICROSCOPIC
Bacteria, UA: NONE SEEN
Bilirubin Urine: NEGATIVE
Glucose, UA: NEGATIVE mg/dL
Ketones, ur: NEGATIVE mg/dL
Nitrite: NEGATIVE
Protein, ur: 30 mg/dL — AB
Specific Gravity, Urine: 1.008 (ref 1.005–1.030)
WBC, UA: >50 WBC/hpf (ref 0–5)
pH: 6 (ref 5.0–8.0)

## 2023-09-21 MED ORDER — BORTEZOMIB CHEMO SQ INJECTION 3.5 MG (2.5MG/ML)
1.0000 mg/m2 | Freq: Once | INTRAMUSCULAR | Status: AC
  Administered 2023-09-21: 2.25 mg via SUBCUTANEOUS
  Filled 2023-09-21: qty 0.9

## 2023-09-21 MED ORDER — OYSTER SHELL CALCIUM/D3 500-5 MG-MCG PO TABS: 1.0000 | ORAL_TABLET | Freq: Two times a day (BID) | ORAL | 3 refills | Status: DC

## 2023-09-21 NOTE — Progress Notes (Signed)
 [facility] [phone]   [phone]  PROGRESS NOTE  Patient Care Team: [name], DO as PCP - General (Internal Medicine) Ladona Heinz, MD as PCP - Cardiology (Cardiology)  Hematological/Oncological History #  IgG Kappa multiple myeloma.    ONCOLOGIC/HEMATOLOGIC HISTORY: 06/08/2023: Underwent CT CAP during admission for acute hypoxic respiratory failure in the setting of Influenza A and CAP vs aspiration pneumonia. Findings showed scattered bony lytic lesions in L 4th rib, pelvis including right ilium and thoracic spine.  06/09/2023: Labs collected during admission includes protein electrophoresis did not observe an M spike. UPEP 24 hour urine showed elevated free kappa light chains (611.43) and free lambda light chains (78.28) with a normal free kappa/lambda ratio.  07/13/2023: Established care with [facility] SPEP detected M protein measuring 0.3 g/dL. IFE showed IgG monoclonal protein with kappa light chain specificity. Serum free light chains showed elevated kappa light chain measuring 512.1, lambda light chain 20.3, ratio 25.23.  07/31/2023: Bone marrow biopsy revealed hypercellular bone marrow with plasma cell neoplasm representing 28% of all cells.  08/17/2023: Cycle 1, Day 1 of Velcade /Dex 09/21/2023: Cycle 2, Day 1 of Velcade /Rev/Dex  Interval History:  David Mcgrath 73 y.o. male with medical history significant for IgG Kappa MM who presents for a follow up visit. The patient's last visit was on 09/07/2023. In the interim since the last visit he has had daily diarrhea.   On exam today David Mcgrath reports he continues to struggle with diarrhea as his major issue.  He reports he is trying Lomotil  and has been working with his dietitian or to try to improve his diarrhea.  He feels like if he can eat right he might be able to help with the loose stools.  He notes that the stool is mostly liquid but with no blood or black.  He  reports his appetite is good he is doing his best to stay hydrated.  He is not having any associated abdominal pain or nausea/vomiting.  He reports no lightheadedness, dizziness, or shortness of breath.  He reports sometimes he has a bowel movement every hour.  He is also having increased urination without any burning.  He reports that he has not noticed a difference in his standard neuropathy.  He is having continued swelling of his lower extremities.  He did unfortunately stay up all night when he was taking full dose of dexamethasone  or reports improvement on the 20 mg of Dex.  He otherwise denies any fevers, chills, sweats, nausea, vomiting or diarrhea.  Full 10 point ROS is otherwise negative.  MEDICAL HISTORY:  Past Medical History:  Diagnosis Date   Acute hypoxic respiratory failure (HCC) 06/04/2023   AKI (acute Mcgrath injury) (HCC) 06/04/2023   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/04/2018   Depression 02/25/2014   Managed well with Prozac  20 mg po daily.  Patient reports he does not have symptoms as of 02/25/14.     Diarrhea 06/04/2023   Essential hypertension 06/04/2023   Fatty liver 06/04/2023   Flu 06/04/2023   Hyperlipidemia 06/04/2023   Ileus (HCC) 06/04/2023   Neuropathy    Paroxysmal atrial fibrillation (HCC) 07/04/2023   Pneumonia 06/04/2023    SURGICAL HISTORY: Past Surgical History:  Procedure Laterality Date   IR BONE MARROW BIOPSY & ASPIRATION  07/31/2023   NO PAST SURGERIES      SOCIAL HISTORY: Social History   Socioeconomic History   Marital status: Married    Spouse name:  Elizabeth   Number of children: 0   Years of education: Not on file   Highest education level: Not on file  Occupational History   Occupation: Event organiser  Tobacco Use   Smoking status: Never   Smokeless tobacco: Never  Substance and Sexual Activity   Alcohol  use: Yes    Comment: socialy   Drug use: No   Sexual activity: Not on file  Other Topics Concern   Not on file  Social  History Narrative   Lives with spouse   Right handed   Drinks 4+ cups of caffeine daily   Retired    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Corporate investment banker Strain: Not on file  Food Insecurity: No Food Insecurity (06/04/2023)   Hunger Vital Sign    Worried About Running Out of Food in the Last Year: Never true    Ran Out of Food in the Last Year: Never true  Transportation Needs: No Transportation Needs (06/04/2023)   PRAPARE - Administrator, Civil Service (Medical): No    Lack of Transportation (Non-Medical): No  Physical Activity: Not on file  Stress: Not on file  Social Connections: Socially Integrated (06/04/2023)   Social Connection and Isolation Panel    Frequency of Communication with Friends and Family: More than three times a week    Frequency of Social Gatherings with Friends and Family: Twice a week    Attends Religious Services: More than 4 times per year    Active Member of [employer] or Organizations: Yes    Attends Engineer, structural: More than 4 times per year    Marital Status: Married  Catering manager Violence: Not At Risk (06/04/2023)   Humiliation, Afraid, Rape, and Kick questionnaire    Fear of Current or Ex-Partner: No    Emotionally Abused: No    Physically Abused: No    Sexually Abused: No    FAMILY HISTORY: Family History  Problem Relation Age of Onset   Diabetes Mother    Cancer Mother    Heart disease Mother    Cancer Father    Hyperlipidemia Father    Neuropathy Paternal Uncle    Migraines Neg Hx     ALLERGIES:  is allergic to shrimp [shellfish allergy].  MEDICATIONS:  Current Outpatient Medications  Medication Sig Dispense Refill   acyclovir  (ZOVIRAX ) 400 MG tablet Take 1 tablet (400 mg total) by mouth 2 (two) times daily. 60 tablet 3   allopurinol  (ZYLOPRIM ) 300 MG tablet Take 300 mg by mouth daily.     apixaban  (ELIQUIS ) 5 MG TABS tablet Take 1 tablet (5 mg total) by mouth 2 (two) times daily. 60 tablet 12   calcium -vitamin D  (OSCAL WITH D) 500-5 MG-MCG tablet Take 1 tablet by mouth 2 (two) times daily. 60 tablet 3   Cholecalciferol (VITAMIN D-3) 125 MCG (5000 UT) TABS Take 5,000 Units by mouth daily.     Cyanocobalamin  (VITAMIN B12) 1000 MCG TBCR Take 1,000 mcg by mouth daily.     dexamethasone  (DECADRON ) 4 MG tablet Take 40 mg by mouth once a week on day of Velcade  injection. (10 tablets) 40 tablet 2   diphenoxylate -atropine  (LOMOTIL ) 2.5-0.025 MG tablet Take 1 tablet by mouth 4 (four) times daily as needed for diarrhea or loose stools. 120 tablet 0   ezetimibe (ZETIA) 10 MG tablet Take 10 mg by mouth every evening.     FLUoxetine  (PROZAC ) 20 MG capsule Take 20 mg by mouth daily.  fluticasone (FLONASE) 50 MCG/ACT nasal spray Place 1 spray into both nostrils daily.     gabapentin  (NEURONTIN ) 300 MG capsule Take 2 capsules (600 mg total) by mouth 2 (two) times daily. (Patient taking differently: Take 600 mg by mouth 3 (three) times daily.)     guaiFENesin  (MUCINEX ) 600 MG 12 hr tablet Take 600 mg by mouth daily.     guaifenesin  (ROBITUSSIN) 100 MG/5ML syrup Take 200 mg by mouth daily as needed for cough.     lenalidomide  (REVLIMID ) 25 MG capsule Take 1 capsule (25 mg total) by mouth daily. Take one daily for 21 days and then none for 7 days. Repeat every 28 days. 21 capsule 0   melatonin 5 MG TABS Take 5 mg by mouth at bedtime.     omeprazole (PRILOSEC) 40 MG capsule Take 40 mg by mouth daily.     ondansetron  (ZOFRAN ) 8 MG tablet Take 1 tablet (8 mg total) by mouth every 8 (eight) hours as needed for nausea or vomiting. 30 tablet 1   potassium chloride  SA (KLOR-CON  M) 20 MEQ tablet Take 1 tablet (20 mEq total) by mouth 2 (two) times daily. 60 tablet 2   pravastatin (PRAVACHOL) 80 MG tablet Take 80 mg by mouth at bedtime.     prochlorperazine  (COMPAZINE ) 10 MG tablet Take 1 tablet (10 mg total) by mouth every 6 (six) hours as needed for nausea or vomiting. 30 tablet 1   pyridoxine (B-6) 100 MG tablet Take 100 mg by  mouth daily.     No current facility-administered medications for this visit.    REVIEW OF SYSTEMS:   Constitutional: ( - ) fevers, ( - )  chills , ( - ) night sweats Eyes: ( - ) blurriness of vision, ( - ) double vision, ( - ) watery eyes Ears, nose, mouth, throat, and face: ( - ) mucositis, ( - ) sore throat Respiratory: ( - ) cough, ( - ) dyspnea, ( - ) wheezes Cardiovascular: ( - ) palpitation, ( - ) chest discomfort, ( - ) lower extremity swelling Gastrointestinal:  ( - ) nausea, ( - ) heartburn, ( - ) change in bowel habits Skin: ( - ) abnormal skin rashes Lymphatics: ( - ) new lymphadenopathy, ( - ) easy bruising Neurological: ( - ) numbness, ( - ) tingling, ( - ) new weaknesses Behavioral/Psych: ( - ) mood change, ( - ) new changes  All other systems were reviewed with the patient and are negative.  PHYSICAL EXAMINATION:  Vitals:   09/21/23 1408  BP: (!) 125/56  Pulse: (!) 57  Resp: 17  Temp: 97.8 F (36.6 C)  SpO2: 99%    Filed Weights   09/21/23 1408  Weight: 211 lb 4.8 oz (95.8 kg)     GENERAL: Well-appearing elderly Caucasian male, alert, no distress and comfortable SKIN: skin color, texture, turgor are normal, no rashes or significant lesions EYES: conjunctiva are pink and non-injected, sclera clear LUNGS: clear to auscultation and percussion with normal breathing effort HEART: regular rate & rhythm and no murmurs and no lower extremity edema Musculoskeletal: no cyanosis of digits and no clubbing  PSYCH: alert & oriented x 3, fluent speech NEURO: no focal motor/sensory deficits  LABORATORY DATA:  I have reviewed the data as listed    Latest Ref Rng & Units 09/28/2023    3:07 PM 09/21/2023    1:39 PM 09/14/2023    2:51 PM  CBC  WBC 4.0 - 10.5 K/uL 5.9  11.7  6.4   Hemoglobin 13.0 - 17.0 g/dL 88.7  88.8  89.1   Hematocrit 39.0 - 52.0 % 32.6  31.8  30.5   Platelets 150 - 400 K/uL 219  197  212        Latest Ref Rng & Units 09/28/2023    3:07 PM  09/21/2023    1:39 PM 09/14/2023    2:51 PM  CMP  Glucose 70 - 99 mg/dL 804  843  782   BUN 8 - 23 mg/dL 8  5  7    Creatinine 0.61 - 1.24 mg/dL 9.33  9.33  9.37   Sodium 135 - 145 mmol/L 138  136  133   Potassium 3.5 - 5.1 mmol/L 4.7  3.0  3.6   Chloride 98 - 111 mmol/L 104  103  100   CO2 22 - 32 mmol/L 27  24  21    Calcium  8.9 - 10.3 mg/dL 6.8  6.0  6.6   Total Protein 6.5 - 8.1 g/dL 5.7  5.1  5.1   Total Bilirubin 0.0 - 1.2 mg/dL 0.3  0.4  0.4   Alkaline Phos 38 - 126 U/L 60  62  56   AST 15 - 41 U/L 39  32  44   ALT 0 - 44 U/L 36  33  35     Lab Results  Component Value Date   MPROTEIN Not Observed 09/07/2023   MPROTEIN Not Observed 08/17/2023   MPROTEIN 0.3 (H) 07/13/2023   Lab Results  Component Value Date   KPAFRELGTCHN 48.0 (H) 09/28/2023   KPAFRELGTCHN 41.8 (H) 09/07/2023   KPAFRELGTCHN 347.1 (H) 08/17/2023   LAMBDASER 31.4 (H) 09/28/2023   LAMBDASER 17.4 09/07/2023   LAMBDASER 22.3 08/17/2023   KAPLAMBRATIO 1.53 09/28/2023   KAPLAMBRATIO 2.40 (H) 09/07/2023   KAPLAMBRATIO 15.57 (H) 08/17/2023   RADIOGRAPHIC STUDIES: No results found.  ASSESSMENT & PLAN David Mcgrath 73 y.o. male with medical history significant for IgG Kappa MM who presents for a follow up visit.   #IgG Kappa Multiple Myeloma: -Initially presented with scattered bony lytic lesions on CT CAP from 06/08/2023.  -Baseline labs from 07/13/2023 included SPEP detecting M protein measuring 0.3 g/dL. Kappa light chain elevated to 512.1, lambda light chain 20.3, ratio 25.23.  -Bone met survey from 07/20/2023 showed rxpansile lucent lesions in the right T1 transverse process, posterior right sixth rib, and left posteromedial fourth rib -Bone marrow biopsy confirmed plasma cell neoplasm measuring 28% of all cells.  -Recommend Velcade  plus Dexamethasone  therapy, started on 08/17/2023. Added Revlimid  during Cycle 2  PLAN: --Due for Cycle 2, Day 1 of Velcade /Rev/Dexamethasone  today.  Revlimid  added  with cycle 2 --Labs from today were reviewed and adequate for treatment. WBC 5.9, hemoglobin 10.2, MCV 93.1, platelets 219, Creatinine normal.  --Proceed with weekly treatments, dropped Velcade  to 1 mg/m due to diarrhea --Currently patient is receiving 20 mg p.o. Dex on treatment days due to insomnia and side effects --RTC in 2 weeks for a toxicity check   #Diarrhea -- Modest improvement with dose reduction, patient not taking Lomotil  -- If we are unable to control his diarrhea could consider discontinuing Velcade  and transitioning to Darzalex -- Patient maintains good appetite and hydration. -- Continue to monitor  # Peripheral neuropathy: --Evaluated by neurology before MM diagnosis.  --EMG nerve conduction study done on 01/30/2020 by Dr. Margaret confirms severe axonal sensorimotor polyneuropathy as well as moderate right carpal tunnel.  --Currently on gabapentin  600 mg TID.  --  Monitor closely with initiation of velcade  as neuropathy can worsen.    #Supportive Care -- chemotherapy education to be scheduled  -- zofran  8mg  q8H PRN and compazine  10mg  PO q6H for nausea -- acyclovir  400mg  PO BID for VCZ prophylaxis --  adding Zometa  today, we have dental clearance on file.  -- no pain medication required at this time.    #Age related screenings -Colorectal screening UTD. -PSA from 07/13/2023 was normal at 0.4.   No orders of the defined types were placed in this encounter.   All questions were answered. The patient knows to call the clinic with any problems, questions or concerns.  A total of more than 30 minutes were spent on this encounter with face-to-face time and non-face-to-face time, including preparing to see the patient, ordering tests and/or medications, counseling the patient and coordination of care as outlined above.   David IVAR Kidney, MD Department of Hematology/Oncology [facility] at [facility] Phone: [phone] Pager:  [phone] Email: [email or web address]  10/01/2023 3:25 PM

## 2023-09-21 NOTE — Progress Notes (Signed)
 Nutrition Follow-up:  Pt with multiple myeloma. He is receiving Velcade  q21d. Revlimid  held due to diarrhea.   Met with pt and wife in infusion. Pt reports diarrhea is getting better. Dr. Rosaline Coma holding Revlimid  per wife. Banatrol samples were helpful. Wife has purchased this for home. Pt reports appetite is improving. Planning to have chicken tonight. Bilateral lower extremity swelling appreciated. Patient reports this is much better today. He denies nausea, vomiting.    Medications: reviewed   Labs: K 3.0, glucose 156, BUN 5, albumin 2.9 (trending up 2.6 on 6/12)  Anthropometrics: Wt 211 lb 4.8 oz today (fluid status likely contributing to increased wt)  6/5 - 204 lb 8 oz 5/21 - 212 lb 3.2 oz    NUTRITION DIAGNOSIS: Unintentional wt loss - improving    INTERVENTION:  Reviewed strategies for diarrhea, foods best tolerated Continue banatrol as needed Encourage protein source at every meal/snack    MONITORING, EVALUATION, GOAL: wt trends, intake   NEXT VISIT: Thursday July 3 during infusion with Felipa Horsfall

## 2023-09-21 NOTE — Progress Notes (Signed)
 Spoke with David Mcgrath after he was seen by Dr. Rosaline Coma. Advised that a prescription for Ca++ has been sent to his [facility]. He is to take this as his Calcium was low today @6 .0. also made him and his wife aware that his K+ is also low at 3.0. David Mcgrath is to increase his oral K+ to 1 tablet 2 x a day. Encouraged to eat high protein, high calcium foods as well.  Pty and wife voiced understanding.

## 2023-09-21 NOTE — Patient Instructions (Signed)
 [facility] - A DEPT OF [facility]  Discharge Instructions: Thank you for choosing [facility] to provide your oncology and hematology care.   If you have a lab appointment with the Cancer Center, please go directly to the Cancer Center and check in at the registration area.   Wear comfortable clothing and clothing appropriate for easy access to any Portacath or PICC line.   We strive to give you quality time with your provider. You may need to reschedule your appointment if you arrive late (15 or more minutes).  Arriving late affects you and other patients whose appointments are after yours.  Also, if you miss three or more appointments without notifying the office, you may be dismissed from the clinic at the provider's discretion.      For prescription refill requests, have your pharmacy contact our office and allow 72 hours for refills to be completed.    Today you received the following chemotherapy and/or immunotherapy agent:Bortezomib (Velcade)     To help prevent nausea and vomiting after your treatment, we encourage you to take your nausea medication as directed.  BELOW ARE SYMPTOMS THAT SHOULD BE REPORTED IMMEDIATELY: *FEVER GREATER THAN 100.4 F (38 C) OR HIGHER *CHILLS OR SWEATING *NAUSEA AND VOMITING THAT IS NOT CONTROLLED WITH YOUR NAUSEA MEDICATION *UNUSUAL SHORTNESS OF BREATH *UNUSUAL BRUISING OR BLEEDING *URINARY PROBLEMS (pain or burning when urinating, or frequent urination) *BOWEL PROBLEMS (unusual diarrhea, constipation, pain near the anus) TENDERNESS IN MOUTH AND THROAT WITH OR WITHOUT PRESENCE OF ULCERS (sore throat, sores in mouth, or a toothache) UNUSUAL RASH, SWELLING OR PAIN  UNUSUAL VAGINAL DISCHARGE OR ITCHING   Items with * indicate a potential emergency and should be followed up as soon as possible or go to the Emergency Department if any problems should occur.  Please show the CHEMOTHERAPY ALERT CARD or  IMMUNOTHERAPY ALERT CARD at check-in to the Emergency Department and triage nurse.  Should you have questions after your visit or need to cancel or reschedule your appointment, please contact [facility] - A DEPT OF [facility]  Dept: [phone]  and follow the prompts.  Office hours are 8:00 a.m. to 4:30 p.m. Monday - Friday. Please note that voicemails left after 4:00 p.m. may not be returned until the following business day.  We are closed weekends and major holidays. You have access to a nurse at all times for urgent questions. Please call the main number to the clinic Dept: [phone] and follow the prompts.   For any non-urgent questions, you may also contact your provider using MyChart. We now offer e-Visits for anyone 67 and older to request care online for non-urgent symptoms. For details visit [email or web address].   Also download the MyChart app! Go to the app store, search "MyChart", open the app, select [facility], and log in with your MyChart username and password.  Bortezomib Injection What is this medication? BORTEZOMIB (bor TEZ oh mib) treats lymphoma. It may also be used to treat multiple myeloma, a type of bone marrow cancer. It works by blocking a protein that causes cancer cells to grow and multiply. This helps to slow or stop the spread of cancer cells. This medicine may be used for other purposes; ask your health care provider or pharmacist if you have questions. COMMON BRAND NAME(S): BORUZU, Velcade What should I tell my care team before I take this medication? They need to know  if you have any of these conditions: Dehydration Diabetes Heart disease Liver disease Tingling of the fingers or toes or other nerve disorder An unusual or allergic reaction to bortezomib, other medications, foods, dyes, or preservatives If you or your partner are pregnant or trying to get pregnant Breastfeeding How should I use this medication? This  medication is injected into a vein or under the skin. It is given by your care team in a hospital or clinic setting. Talk to your care team about the use of this medication in children. Special care may be needed. Overdosage: If you think you have taken too much of this medicine contact a poison control center or emergency room at once. NOTE: This medicine is only for you. Do not share this medicine with others. What if I miss a dose? Keep appointments for follow-up doses. It is important not to miss your dose. Call your care team if you are unable to keep an appointment. What may interact with this medication? Ketoconazole Rifampin This list may not describe all possible interactions. Give your health care provider a list of all the medicines, herbs, non-prescription drugs, or dietary supplements you use. Also tell them if you smoke, drink alcohol, or use illegal drugs. Some items may interact with your medicine. What should I watch for while using this medication? Your condition will be monitored carefully while you are receiving this medication. You may need blood work while taking this medication. This medication may affect your coordination, reaction time, or judgment. Do not drive or operate machinery until you know how this medication affects you. Sit up or stand slowly to reduce the risk of dizzy or fainting spells. Drinking alcohol with this medication can increase the risk of these side effects. This medication may increase your risk of getting an infection. Call your care team for advice if you get a fever, chills, sore throat, or other symptoms of a cold or flu. Do not treat yourself. Try to avoid being around people who are sick. Check with your care team if you have severe diarrhea, nausea, and vomiting, or if you sweat a lot. The loss of too much body fluid may make it dangerous for you to take this medication. Talk to your care team if you may be pregnant. Serious birth defects can  occur if you take this medication during pregnancy and for 7 months after the last dose. You will need a negative pregnancy test before starting this medication. Contraception is recommended while taking this medication and for 7 months after the last dose. Your care team can help you find the option that works for you. If your partner can get pregnant, use a condom during sex while taking this medication and for 4 months after the last dose. Do not breastfeed while taking this medication and for 2 months after the last dose. This medication may cause infertility. Talk to your care team if you are concerned about your fertility. What side effects may I notice from receiving this medication? Side effects that you should report to your care team as soon as possible: Allergic reactions--skin rash, itching, hives, swelling of the face, lips, tongue, or throat Bleeding--bloody or black, tar-like stools, vomiting blood or brown material that looks like coffee grounds, red or dark brown urine, small red or purple spots on skin, unusual bruising or bleeding Bleeding in the brain--severe headache, stiff neck, confusion, dizziness, change in vision, numbness or weakness of the face, arm, or leg, trouble speaking, trouble walking, vomiting  Bowel blockage--stomach cramping, unable to have a bowel movement or pass gas, loss of appetite, vomiting Heart failure--shortness of breath, swelling of the ankles, feet, or hands, sudden weight gain, unusual weakness or fatigue Infection--fever, chills, cough, sore throat, wounds that don't heal, pain or trouble when passing urine, general feeling of discomfort or being unwell Liver injury--right upper belly pain, loss of appetite, nausea, light-colored stool, dark yellow or brown urine, yellowing skin or eyes, unusual weakness or fatigue Low blood pressure--dizziness, feeling faint or lightheaded, blurry vision Lung injury--shortness of breath or trouble breathing, cough,  spitting up blood, chest pain, fever Pain, tingling, or numbness in the hands or feet Severe or prolonged diarrhea Stomach pain, bloody diarrhea, pale skin, unusual weakness or fatigue, decrease in the amount of urine, which may be signs of hemolytic uremic syndrome Sudden and severe headache, confusion, change in vision, seizures, which may be signs of posterior reversible encephalopathy syndrome (PRES) TTP--purple spots on the skin or inside the mouth, pale skin, yellowing skin or eyes, unusual weakness or fatigue, fever, fast or irregular heartbeat, confusion, change in vision, trouble speaking, trouble walking Tumor lysis syndrome (TLS)--nausea, vomiting, diarrhea, decrease in the amount of urine, dark urine, unusual weakness or fatigue, confusion, muscle pain or cramps, fast or irregular heartbeat, joint pain Side effects that usually do not require medical attention (report to your care team if they continue or are bothersome): Constipation Diarrhea Fatigue Loss of appetite Nausea This list may not describe all possible side effects. Call your doctor for medical advice about side effects. You may report side effects to FDA at 1-800-FDA-1088. Where should I keep my medication? This medication is given in a hospital or clinic. It will not be stored at home. NOTE: This sheet is a summary. It may not cover all possible information. If you have questions about this medicine, talk to your doctor, pharmacist, or health care provider.  2024 Elsevier/Gold Standard (2021-08-24 00:00:00)

## 2023-09-22 ENCOUNTER — Telehealth: Payer: Self-pay | Admitting: *Deleted

## 2023-09-22 LAB — URINE CULTURE: Culture: <10000 — AB

## 2023-09-22 NOTE — Telephone Encounter (Signed)
 TCT pt's wife, Paola Bohr with U/A results. Per Dr. Rosaline Coma:  His UA did not show any evidence of urinary tract infection, however there was evidence of inflammation. It is possible to chemotherapy is irritating his bladder and urethra some. There is no specific treatment we recommend for this, though we could consider over-the-counter AZO which can sometimes help with this discomfort.  Paola Bohr voiced understanding and will pick up some AZO today. She states he feels better today, no burning with urination.  Advised to continue with fluids, cranberry juice and the AZO as needed. She voiced understanding.

## 2023-09-26 DIAGNOSIS — Z7901 Long term (current) use of anticoagulants: Secondary | ICD-10-CM | POA: Diagnosis not present

## 2023-09-26 DIAGNOSIS — Z85828 Personal history of other malignant neoplasm of skin: Secondary | ICD-10-CM | POA: Diagnosis not present

## 2023-09-26 DIAGNOSIS — E785 Hyperlipidemia, unspecified: Secondary | ICD-10-CM | POA: Diagnosis not present

## 2023-09-26 DIAGNOSIS — M109 Gout, unspecified: Secondary | ICD-10-CM | POA: Diagnosis not present

## 2023-09-26 DIAGNOSIS — K219 Gastro-esophageal reflux disease without esophagitis: Secondary | ICD-10-CM | POA: Diagnosis not present

## 2023-09-26 DIAGNOSIS — M899 Disorder of bone, unspecified: Secondary | ICD-10-CM | POA: Diagnosis not present

## 2023-09-26 DIAGNOSIS — F419 Anxiety disorder, unspecified: Secondary | ICD-10-CM | POA: Diagnosis not present

## 2023-09-26 DIAGNOSIS — K760 Fatty (change of) liver, not elsewhere classified: Secondary | ICD-10-CM | POA: Diagnosis not present

## 2023-09-26 DIAGNOSIS — G603 Idiopathic progressive neuropathy: Secondary | ICD-10-CM | POA: Diagnosis not present

## 2023-09-26 DIAGNOSIS — G4733 Obstructive sleep apnea (adult) (pediatric): Secondary | ICD-10-CM | POA: Diagnosis not present

## 2023-09-26 DIAGNOSIS — I10 Essential (primary) hypertension: Secondary | ICD-10-CM | POA: Diagnosis not present

## 2023-09-27 DIAGNOSIS — M109 Gout, unspecified: Secondary | ICD-10-CM | POA: Diagnosis not present

## 2023-09-27 DIAGNOSIS — G4733 Obstructive sleep apnea (adult) (pediatric): Secondary | ICD-10-CM | POA: Diagnosis not present

## 2023-09-27 DIAGNOSIS — K219 Gastro-esophageal reflux disease without esophagitis: Secondary | ICD-10-CM | POA: Diagnosis not present

## 2023-09-27 DIAGNOSIS — G603 Idiopathic progressive neuropathy: Secondary | ICD-10-CM | POA: Diagnosis not present

## 2023-09-27 DIAGNOSIS — F419 Anxiety disorder, unspecified: Secondary | ICD-10-CM | POA: Diagnosis not present

## 2023-09-27 DIAGNOSIS — C44311 Basal cell carcinoma of skin of nose: Secondary | ICD-10-CM | POA: Diagnosis not present

## 2023-09-27 DIAGNOSIS — I10 Essential (primary) hypertension: Secondary | ICD-10-CM | POA: Diagnosis not present

## 2023-09-27 DIAGNOSIS — E785 Hyperlipidemia, unspecified: Secondary | ICD-10-CM | POA: Diagnosis not present

## 2023-09-27 DIAGNOSIS — M899 Disorder of bone, unspecified: Secondary | ICD-10-CM | POA: Diagnosis not present

## 2023-09-27 DIAGNOSIS — Z7901 Long term (current) use of anticoagulants: Secondary | ICD-10-CM | POA: Diagnosis not present

## 2023-09-27 DIAGNOSIS — K760 Fatty (change of) liver, not elsewhere classified: Secondary | ICD-10-CM | POA: Diagnosis not present

## 2023-09-27 DIAGNOSIS — Z85828 Personal history of other malignant neoplasm of skin: Secondary | ICD-10-CM | POA: Diagnosis not present

## 2023-09-28 ENCOUNTER — Inpatient Hospital Stay

## 2023-09-28 ENCOUNTER — Other Ambulatory Visit: Payer: Self-pay | Admitting: *Deleted

## 2023-09-28 ENCOUNTER — Other Ambulatory Visit: Payer: Self-pay | Admitting: Hematology and Oncology

## 2023-09-28 VITALS — BP 132/74 | HR 93 | Temp 98.0°F | Resp 17 | Wt 224.0 lb

## 2023-09-28 DIAGNOSIS — Z79624 Long term (current) use of inhibitors of nucleotide synthesis: Secondary | ICD-10-CM | POA: Diagnosis not present

## 2023-09-28 DIAGNOSIS — C9000 Multiple myeloma not having achieved remission: Secondary | ICD-10-CM | POA: Diagnosis not present

## 2023-09-28 DIAGNOSIS — Z79899 Other long term (current) drug therapy: Secondary | ICD-10-CM | POA: Diagnosis not present

## 2023-09-28 DIAGNOSIS — G629 Polyneuropathy, unspecified: Secondary | ICD-10-CM | POA: Diagnosis not present

## 2023-09-28 DIAGNOSIS — Z7961 Long term (current) use of immunomodulator: Secondary | ICD-10-CM | POA: Diagnosis not present

## 2023-09-28 DIAGNOSIS — Z7901 Long term (current) use of anticoagulants: Secondary | ICD-10-CM | POA: Diagnosis not present

## 2023-09-28 DIAGNOSIS — Z5112 Encounter for antineoplastic immunotherapy: Secondary | ICD-10-CM | POA: Diagnosis not present

## 2023-09-28 LAB — CBC WITH DIFFERENTIAL (CANCER CENTER ONLY)
Abs Immature Granulocytes: 0.04 10*3/uL (ref 0.00–0.07)
Basophils Absolute: 0 10*3/uL (ref 0.0–0.1)
Basophils Relative: 0 %
Eosinophils Absolute: 0 10*3/uL (ref 0.0–0.5)
Eosinophils Relative: 0 %
HCT: 32.6 % — ABNORMAL LOW (ref 39.0–52.0)
Hemoglobin: 11.2 g/dL — ABNORMAL LOW (ref 13.0–17.0)
Immature Granulocytes: 1 %
Lymphocytes Relative: 4 %
Lymphs Abs: 0.2 10*3/uL — ABNORMAL LOW (ref 0.7–4.0)
MCH: 32 pg (ref 26.0–34.0)
MCHC: 34.4 g/dL (ref 30.0–36.0)
MCV: 93.1 fL (ref 80.0–100.0)
Monocytes Absolute: 0.1 10*3/uL (ref 0.1–1.0)
Monocytes Relative: 2 %
Neutro Abs: 5.5 10*3/uL (ref 1.7–7.7)
Neutrophils Relative %: 93 %
Platelet Count: 219 10*3/uL (ref 150–400)
RBC: 3.5 MIL/uL — ABNORMAL LOW (ref 4.22–5.81)
RDW: 15.1 % (ref 11.5–15.5)
WBC Count: 5.9 10*3/uL (ref 4.0–10.5)
nRBC: 0 % (ref 0.0–0.2)

## 2023-09-28 LAB — CMP (CANCER CENTER ONLY)
ALT: 36 U/L (ref 0–44)
AST: 39 U/L (ref 15–41)
Albumin: 3.1 g/dL — ABNORMAL LOW (ref 3.5–5.0)
Alkaline Phosphatase: 60 U/L (ref 38–126)
Anion gap: 7 (ref 5–15)
BUN: 8 mg/dL (ref 8–23)
CO2: 27 mmol/L (ref 22–32)
Calcium: 6.8 mg/dL — ABNORMAL LOW (ref 8.9–10.3)
Chloride: 104 mmol/L (ref 98–111)
Creatinine: 0.66 mg/dL (ref 0.61–1.24)
GFR, Estimated: >60 mL/min (ref >60)
Glucose, Bld: 195 mg/dL — ABNORMAL HIGH (ref 70–99)
Potassium: 4.7 mmol/L (ref 3.5–5.1)
Sodium: 138 mmol/L (ref 135–145)
Total Bilirubin: 0.3 mg/dL (ref 0.0–1.2)
Total Protein: 5.7 g/dL — ABNORMAL LOW (ref 6.5–8.1)

## 2023-09-28 MED ORDER — POTASSIUM CHLORIDE CRYS ER 20 MEQ PO TBCR: 20.0000 meq | EXTENDED_RELEASE_TABLET | Freq: Two times a day (BID) | ORAL | 2 refills | Status: DC

## 2023-09-28 MED ORDER — BORTEZOMIB CHEMO SQ INJECTION 3.5 MG (2.5MG/ML)
1.0000 mg/m2 | Freq: Once | INTRAMUSCULAR | Status: AC
  Administered 2023-09-28: 2.25 mg via SUBCUTANEOUS
  Filled 2023-09-28: qty 0.9

## 2023-09-28 NOTE — Patient Instructions (Signed)
 [facility] - A DEPT OF [facility]  Discharge Instructions: Thank you for choosing [facility] to provide your oncology and hematology care.   If you have a lab appointment with the Cancer Center, please go directly to the Cancer Center and check in at the registration area.   Wear comfortable clothing and clothing appropriate for easy access to any Portacath or PICC line.   We strive to give you quality time with your provider. You may need to reschedule your appointment if you arrive late (15 or more minutes).  Arriving late affects you and other patients whose appointments are after yours.  Also, if you miss three or more appointments without notifying the office, you may be dismissed from the clinic at the provider's discretion.      For prescription refill requests, have your pharmacy contact our office and allow 72 hours for refills to be completed.    Today you received the following chemotherapy and/or immunotherapy agents velcade      To help prevent nausea and vomiting after your treatment, we encourage you to take your nausea medication as directed.  BELOW ARE SYMPTOMS THAT SHOULD BE REPORTED IMMEDIATELY: *FEVER GREATER THAN 100.4 F (38 C) OR HIGHER *CHILLS OR SWEATING *NAUSEA AND VOMITING THAT IS NOT CONTROLLED WITH YOUR NAUSEA MEDICATION *UNUSUAL SHORTNESS OF BREATH *UNUSUAL BRUISING OR BLEEDING *URINARY PROBLEMS (pain or burning when urinating, or frequent urination) *BOWEL PROBLEMS (unusual diarrhea, constipation, pain near the anus) TENDERNESS IN MOUTH AND THROAT WITH OR WITHOUT PRESENCE OF ULCERS (sore throat, sores in mouth, or a toothache) UNUSUAL RASH, SWELLING OR PAIN  UNUSUAL VAGINAL DISCHARGE OR ITCHING   Items with * indicate a potential emergency and should be followed up as soon as possible or go to the Emergency Department if any problems should occur.  Please show the CHEMOTHERAPY ALERT CARD or IMMUNOTHERAPY  ALERT CARD at check-in to the Emergency Department and triage nurse.  Should you have questions after your visit or need to cancel or reschedule your appointment, please contact [facility] - A DEPT OF [facility]  Dept: [phone]  and follow the prompts.  Office hours are 8:00 a.m. to 4:30 p.m. Monday - Friday. Please note that voicemails left after 4:00 p.m. may not be returned until the following business day.  We are closed weekends and major holidays. You have access to a nurse at all times for urgent questions. Please call the main number to the clinic Dept: [phone] and follow the prompts.   For any non-urgent questions, you may also contact your provider using MyChart. We now offer e-Visits for anyone 85 and older to request care online for non-urgent symptoms. For details visit [email or web address].   Also download the MyChart app! Go to the app store, search "MyChart", open the app, select [place], and log in with your MyChart username and password.

## 2023-09-29 DIAGNOSIS — F419 Anxiety disorder, unspecified: Secondary | ICD-10-CM | POA: Diagnosis not present

## 2023-09-29 DIAGNOSIS — K760 Fatty (change of) liver, not elsewhere classified: Secondary | ICD-10-CM | POA: Diagnosis not present

## 2023-09-29 DIAGNOSIS — M109 Gout, unspecified: Secondary | ICD-10-CM | POA: Diagnosis not present

## 2023-09-29 DIAGNOSIS — I10 Essential (primary) hypertension: Secondary | ICD-10-CM | POA: Diagnosis not present

## 2023-09-29 DIAGNOSIS — K219 Gastro-esophageal reflux disease without esophagitis: Secondary | ICD-10-CM | POA: Diagnosis not present

## 2023-09-29 DIAGNOSIS — G4733 Obstructive sleep apnea (adult) (pediatric): Secondary | ICD-10-CM | POA: Diagnosis not present

## 2023-09-29 DIAGNOSIS — G603 Idiopathic progressive neuropathy: Secondary | ICD-10-CM | POA: Diagnosis not present

## 2023-09-29 DIAGNOSIS — Z7901 Long term (current) use of anticoagulants: Secondary | ICD-10-CM | POA: Diagnosis not present

## 2023-09-29 DIAGNOSIS — M899 Disorder of bone, unspecified: Secondary | ICD-10-CM | POA: Diagnosis not present

## 2023-09-29 DIAGNOSIS — E785 Hyperlipidemia, unspecified: Secondary | ICD-10-CM | POA: Diagnosis not present

## 2023-09-29 DIAGNOSIS — Z85828 Personal history of other malignant neoplasm of skin: Secondary | ICD-10-CM | POA: Diagnosis not present

## 2023-09-29 LAB — KAPPA/LAMBDA LIGHT CHAINS
Kappa free light chain: 48 mg/L — ABNORMAL HIGH (ref 3.3–19.4)
Kappa, lambda light chain ratio: 1.53 (ref 0.26–1.65)
Lambda free light chains: 31.4 mg/L — ABNORMAL HIGH (ref 5.7–26.3)

## 2023-10-01 ENCOUNTER — Encounter: Payer: Self-pay | Admitting: Hematology and Oncology

## 2023-10-02 ENCOUNTER — Encounter: Payer: Self-pay | Admitting: Hematology and Oncology

## 2023-10-02 ENCOUNTER — Other Ambulatory Visit: Payer: Self-pay | Admitting: *Deleted

## 2023-10-02 DIAGNOSIS — G603 Idiopathic progressive neuropathy: Secondary | ICD-10-CM | POA: Diagnosis not present

## 2023-10-02 DIAGNOSIS — K760 Fatty (change of) liver, not elsewhere classified: Secondary | ICD-10-CM | POA: Diagnosis not present

## 2023-10-02 DIAGNOSIS — I10 Essential (primary) hypertension: Secondary | ICD-10-CM | POA: Diagnosis not present

## 2023-10-02 DIAGNOSIS — C9000 Multiple myeloma not having achieved remission: Secondary | ICD-10-CM

## 2023-10-02 DIAGNOSIS — M109 Gout, unspecified: Secondary | ICD-10-CM | POA: Diagnosis not present

## 2023-10-02 DIAGNOSIS — K219 Gastro-esophageal reflux disease without esophagitis: Secondary | ICD-10-CM | POA: Diagnosis not present

## 2023-10-02 DIAGNOSIS — G4733 Obstructive sleep apnea (adult) (pediatric): Secondary | ICD-10-CM | POA: Diagnosis not present

## 2023-10-02 DIAGNOSIS — Z85828 Personal history of other malignant neoplasm of skin: Secondary | ICD-10-CM | POA: Diagnosis not present

## 2023-10-02 DIAGNOSIS — Z7901 Long term (current) use of anticoagulants: Secondary | ICD-10-CM | POA: Diagnosis not present

## 2023-10-02 DIAGNOSIS — F419 Anxiety disorder, unspecified: Secondary | ICD-10-CM | POA: Diagnosis not present

## 2023-10-02 DIAGNOSIS — E785 Hyperlipidemia, unspecified: Secondary | ICD-10-CM | POA: Diagnosis not present

## 2023-10-02 DIAGNOSIS — M899 Disorder of bone, unspecified: Secondary | ICD-10-CM | POA: Diagnosis not present

## 2023-10-02 LAB — MULTIPLE MYELOMA PANEL, SERUM
Albumin SerPl Elph-Mcnc: 2.6 g/dL — ABNORMAL LOW (ref 2.9–4.4)
Albumin/Glob SerPl: 1.1 (ref 0.7–1.7)
Alpha 1: 0.3 g/dL (ref 0.0–0.4)
Alpha2 Glob SerPl Elph-Mcnc: 0.9 g/dL (ref 0.4–1.0)
B-Globulin SerPl Elph-Mcnc: 0.6 g/dL — ABNORMAL LOW (ref 0.7–1.3)
Gamma Glob SerPl Elph-Mcnc: 0.8 g/dL (ref 0.4–1.8)
Globulin, Total: 2.6 g/dL (ref 2.2–3.9)
IgA: 142 mg/dL (ref 61–437)
IgG (Immunoglobin G), Serum: 789 mg/dL (ref 603–1613)
IgM (Immunoglobulin M), Srm: 76 mg/dL (ref 15–143)
Total Protein ELP: 5.2 g/dL — ABNORMAL LOW (ref 6.0–8.5)

## 2023-10-02 MED ORDER — LENALIDOMIDE 25 MG PO CAPS: 25.0000 mg | ORAL_CAPSULE | Freq: Every day | ORAL | 0 refills | Status: DC

## 2023-10-03 DIAGNOSIS — Z7901 Long term (current) use of anticoagulants: Secondary | ICD-10-CM | POA: Diagnosis not present

## 2023-10-03 DIAGNOSIS — K760 Fatty (change of) liver, not elsewhere classified: Secondary | ICD-10-CM | POA: Diagnosis not present

## 2023-10-03 DIAGNOSIS — I10 Essential (primary) hypertension: Secondary | ICD-10-CM | POA: Diagnosis not present

## 2023-10-03 DIAGNOSIS — G603 Idiopathic progressive neuropathy: Secondary | ICD-10-CM | POA: Diagnosis not present

## 2023-10-03 DIAGNOSIS — M899 Disorder of bone, unspecified: Secondary | ICD-10-CM | POA: Diagnosis not present

## 2023-10-03 DIAGNOSIS — M109 Gout, unspecified: Secondary | ICD-10-CM | POA: Diagnosis not present

## 2023-10-03 DIAGNOSIS — G4733 Obstructive sleep apnea (adult) (pediatric): Secondary | ICD-10-CM | POA: Diagnosis not present

## 2023-10-03 DIAGNOSIS — Z85828 Personal history of other malignant neoplasm of skin: Secondary | ICD-10-CM | POA: Diagnosis not present

## 2023-10-03 DIAGNOSIS — K219 Gastro-esophageal reflux disease without esophagitis: Secondary | ICD-10-CM | POA: Diagnosis not present

## 2023-10-03 DIAGNOSIS — F419 Anxiety disorder, unspecified: Secondary | ICD-10-CM | POA: Diagnosis not present

## 2023-10-03 DIAGNOSIS — E785 Hyperlipidemia, unspecified: Secondary | ICD-10-CM | POA: Diagnosis not present

## 2023-10-04 ENCOUNTER — Ambulatory Visit: Admitting: Cardiology

## 2023-10-05 ENCOUNTER — Inpatient Hospital Stay: Admitting: Nutrition

## 2023-10-05 ENCOUNTER — Inpatient Hospital Stay: Attending: Physician Assistant

## 2023-10-05 ENCOUNTER — Other Ambulatory Visit: Payer: Self-pay | Admitting: *Deleted

## 2023-10-05 ENCOUNTER — Inpatient Hospital Stay: Admitting: Hematology and Oncology

## 2023-10-05 ENCOUNTER — Inpatient Hospital Stay

## 2023-10-05 VITALS — BP 143/75 | HR 81 | Temp 97.9°F | Resp 16 | Wt 227.2 lb

## 2023-10-05 DIAGNOSIS — Z5112 Encounter for antineoplastic immunotherapy: Secondary | ICD-10-CM | POA: Insufficient documentation

## 2023-10-05 DIAGNOSIS — C9000 Multiple myeloma not having achieved remission: Secondary | ICD-10-CM | POA: Diagnosis not present

## 2023-10-05 DIAGNOSIS — M898X9 Other specified disorders of bone, unspecified site: Secondary | ICD-10-CM | POA: Diagnosis not present

## 2023-10-05 DIAGNOSIS — Z7901 Long term (current) use of anticoagulants: Secondary | ICD-10-CM | POA: Diagnosis not present

## 2023-10-05 DIAGNOSIS — Z79899 Other long term (current) drug therapy: Secondary | ICD-10-CM | POA: Insufficient documentation

## 2023-10-05 DIAGNOSIS — Z7961 Long term (current) use of immunomodulator: Secondary | ICD-10-CM | POA: Diagnosis not present

## 2023-10-05 DIAGNOSIS — Z79624 Long term (current) use of inhibitors of nucleotide synthesis: Secondary | ICD-10-CM | POA: Diagnosis not present

## 2023-10-05 DIAGNOSIS — G629 Polyneuropathy, unspecified: Secondary | ICD-10-CM | POA: Insufficient documentation

## 2023-10-05 LAB — CMP (CANCER CENTER ONLY)
ALT: 35 U/L (ref 0–44)
AST: 37 U/L (ref 15–41)
Albumin: 2.8 g/dL — ABNORMAL LOW (ref 3.5–5.0)
Alkaline Phosphatase: 60 U/L (ref 38–126)
Anion gap: 5 (ref 5–15)
BUN: 7 mg/dL — ABNORMAL LOW (ref 8–23)
CO2: 29 mmol/L (ref 22–32)
Calcium: 7.8 mg/dL — ABNORMAL LOW (ref 8.9–10.3)
Chloride: 103 mmol/L (ref 98–111)
Creatinine: 0.59 mg/dL — ABNORMAL LOW (ref 0.61–1.24)
GFR, Estimated: >60 mL/min (ref >60)
Glucose, Bld: 113 mg/dL — ABNORMAL HIGH (ref 70–99)
Potassium: 4.3 mmol/L (ref 3.5–5.1)
Sodium: 137 mmol/L (ref 135–145)
Total Bilirubin: 0.4 mg/dL (ref 0.0–1.2)
Total Protein: 5.2 g/dL — ABNORMAL LOW (ref 6.5–8.1)

## 2023-10-05 LAB — CBC WITH DIFFERENTIAL (CANCER CENTER ONLY)
Abs Immature Granulocytes: 0.02 10*3/uL (ref 0.00–0.07)
Basophils Absolute: 0 10*3/uL (ref 0.0–0.1)
Basophils Relative: 1 %
Eosinophils Absolute: 0 10*3/uL (ref 0.0–0.5)
Eosinophils Relative: 0 %
HCT: 30.9 % — ABNORMAL LOW (ref 39.0–52.0)
Hemoglobin: 10.3 g/dL — ABNORMAL LOW (ref 13.0–17.0)
Immature Granulocytes: 0 %
Lymphocytes Relative: 5 %
Lymphs Abs: 0.3 10*3/uL — ABNORMAL LOW (ref 0.7–4.0)
MCH: 31.1 pg (ref 26.0–34.0)
MCHC: 33.3 g/dL (ref 30.0–36.0)
MCV: 93.4 fL (ref 80.0–100.0)
Monocytes Absolute: 0.3 10*3/uL (ref 0.1–1.0)
Monocytes Relative: 6 %
Neutro Abs: 4.7 10*3/uL (ref 1.7–7.7)
Neutrophils Relative %: 88 %
Platelet Count: 248 10*3/uL (ref 150–400)
RBC: 3.31 MIL/uL — ABNORMAL LOW (ref 4.22–5.81)
RDW: 16.1 % — ABNORMAL HIGH (ref 11.5–15.5)
WBC Count: 5.4 10*3/uL (ref 4.0–10.5)
nRBC: 0 % (ref 0.0–0.2)

## 2023-10-05 MED ORDER — LENALIDOMIDE 10 MG PO CAPS: 10.0000 mg | ORAL_CAPSULE | Freq: Every day | ORAL | 0 refills | Status: DC

## 2023-10-05 MED ORDER — BORTEZOMIB CHEMO SQ INJECTION 3.5 MG (2.5MG/ML)
1.0000 mg/m2 | Freq: Once | INTRAMUSCULAR | Status: AC
  Administered 2023-10-05: 2.25 mg via SUBCUTANEOUS
  Filled 2023-10-05: qty 0.9

## 2023-10-05 NOTE — Progress Notes (Signed)
 [facility] [phone]   [phone]  PROGRESS NOTE  Patient Care Team: [name], DO as PCP - General (Internal Medicine) Ladona Heinz, MD as PCP - Cardiology (Cardiology)  Hematological/Oncological History #  IgG Kappa multiple myeloma.    ONCOLOGIC/HEMATOLOGIC HISTORY: 06/08/2023: Underwent CT CAP during admission for acute hypoxic respiratory failure in the setting of Influenza A and CAP vs aspiration pneumonia. Findings showed scattered bony lytic lesions in L 4th rib, pelvis including right ilium and thoracic spine.  06/09/2023: Labs collected during admission includes protein electrophoresis did not observe an M spike. UPEP 24 hour urine showed elevated free kappa light chains (611.43) and free lambda light chains (78.28) with a normal free kappa/lambda ratio.  07/13/2023: Established care with [facility] SPEP detected M protein measuring 0.3 g/dL. IFE showed IgG monoclonal protein with kappa light chain specificity. Serum free light chains showed elevated kappa light chain measuring 512.1, lambda light chain 20.3, ratio 25.23.  07/31/2023: Bone marrow biopsy revealed hypercellular bone marrow with plasma cell neoplasm representing 28% of all cells.  08/17/2023: Cycle 1, Day 1 of Velcade /Dex 09/21/2023: Cycle 2, Day 1 of Velcade /Rev/Dex  Interval History:  David Mcgrath 73 y.o. male with medical history significant for IgG Kappa MM who presents for a follow up visit. The patient's last visit was on 09/07/2023. In the interim since the last visit he has had daily diarrhea.   On exam today David Mcgrath reports his diarrhea has dropped dramatically while off the Revlimid  and with a decreased Velcade  dose.  He reports that he is willing to try Revlimid  at a lower dose of 10 mg p.o. daily.  He reports that he does have some discomfort in his left armpit and thinks it may have been due to overactivity.  He reports that it does not  appear to be neuropathic in nature nor does he have any swelling in his arm which would be concerning for VTE.  He reports that he is having no issues with nausea or vomiting.  He reports his energy levels are quite good.  He notes that he is willing to increase the dose of Velcade  at his next visit.  He is also interested in starting tumeric supplementation.  Otherwise he denies any infectious symptoms such as fevers, chills, sweats.  He is willing and able to proceed with myeloma treatment at this time.  MEDICAL HISTORY:  Past Medical History:  Diagnosis Date   Acute hypoxic respiratory failure (HCC) 06/04/2023   AKI (acute kidney injury) (HCC) 06/04/2023   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/04/2018   Depression 02/25/2014   Managed well with Prozac  20 mg po daily.  Patient reports he does not have symptoms as of 02/25/14.     Diarrhea 06/04/2023   Essential hypertension 06/04/2023   Fatty liver 06/04/2023   Flu 06/04/2023   Hyperlipidemia 06/04/2023   Ileus (HCC) 06/04/2023   Neuropathy    Paroxysmal atrial fibrillation (HCC) 07/04/2023   Pneumonia 06/04/2023    SURGICAL HISTORY: Past Surgical History:  Procedure Laterality Date   IR BONE MARROW BIOPSY & ASPIRATION  07/31/2023   NO PAST SURGERIES      SOCIAL HISTORY: Social History   Socioeconomic History   Marital status: Married    Spouse name: Almarie   Number of children: 0   Years of education: Not on file   Highest education level: Not on file  Occupational History   Occupation: Event organiser  Tobacco Use  Smoking status: Never   Smokeless tobacco: Never  Substance and Sexual Activity   Alcohol  use: Yes    Comment: socialy   Drug use: No   Sexual activity: Not on file  Other Topics Concern   Not on file  Social History Narrative   Lives with spouse   Right handed   Drinks 4+ cups of caffeine daily   Retired    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[employer] investment banker Strain: Not on file  Food Insecurity: No  Food Insecurity (06/04/2023)   Hunger Vital Sign    Worried About Running Out of Food in the Last Year: Never true    Ran Out of Food in the Last Year: Never true  Transportation Needs: No Transportation Needs (06/04/2023)   PRAPARE - Administrator, Civil Service (Medical): No    Lack of Transportation (Non-Medical): No  Physical Activity: Not on file  Stress: Not on file  Social Connections: Socially Integrated (06/04/2023)   Social Connection and Isolation Panel    Frequency of Communication with Friends and Family: More than three times a week    Frequency of Social Gatherings with Friends and Family: Twice a week    Attends Religious Services: More than 4 times per year    Active Member of [employer] or Organizations: Yes    Attends Engineer, structural: More than 4 times per year    Marital Status: Married  Catering manager Violence: Not At Risk (06/04/2023)   Humiliation, Afraid, Rape, and Kick questionnaire    Fear of Current or Ex-Partner: No    Emotionally Abused: No    Physically Abused: No    Sexually Abused: No    FAMILY HISTORY: Family History  Problem Relation Age of Onset   Diabetes Mother    Cancer Mother    Heart disease Mother    Cancer Father    Hyperlipidemia Father    Neuropathy Paternal Uncle    Migraines Neg Hx     ALLERGIES:  is allergic to shrimp [shellfish allergy].  MEDICATIONS:  Current Outpatient Medications  Medication Sig Dispense Refill   acyclovir  (ZOVIRAX ) 400 MG tablet Take 1 tablet (400 mg total) by mouth 2 (two) times daily. 60 tablet 3   allopurinol  (ZYLOPRIM ) 300 MG tablet Take 300 mg by mouth daily.     apixaban  (ELIQUIS ) 5 MG TABS tablet Take 1 tablet (5 mg total) by mouth 2 (two) times daily. 60 tablet 12   calcium -vitamin D (OSCAL WITH D) 500-5 MG-MCG tablet Take 1 tablet by mouth 2 (two) times daily. 60 tablet 3   Cholecalciferol (VITAMIN D-3) 125 MCG (5000 UT) TABS Take 5,000 Units by mouth daily.     Cyanocobalamin   (VITAMIN B12) 1000 MCG TBCR Take 1,000 mcg by mouth daily.     dexamethasone  (DECADRON ) 4 MG tablet Take 40 mg by mouth once a week on day of Velcade  injection. (10 tablets) 40 tablet 2   diphenoxylate -atropine  (LOMOTIL ) 2.5-0.025 MG tablet Take 1 tablet by mouth 4 (four) times daily as needed for diarrhea or loose stools. 120 tablet 0   ezetimibe (ZETIA) 10 MG tablet Take 10 mg by mouth every evening.     FLUoxetine  (PROZAC ) 20 MG capsule Take 20 mg by mouth daily.     fluticasone (FLONASE) 50 MCG/ACT nasal spray Place 1 spray into both nostrils daily.     gabapentin  (NEURONTIN ) 300 MG capsule Take 2 capsules (600 mg total) by mouth 2 (two) times daily. (Patient  taking differently: Take 600 mg by mouth 3 (three) times daily.)     guaiFENesin  (MUCINEX ) 600 MG 12 hr tablet Take 600 mg by mouth daily.     guaifenesin  (ROBITUSSIN) 100 MG/5ML syrup Take 200 mg by mouth daily as needed for cough.     lenalidomide  (REVLIMID ) 10 MG capsule Take 1 capsule (10 mg total) by mouth daily. Bertrum Barrows # [id number]     Date Obtained 10/05/23 Take one daily for 21 days then none for 7 days. 21 capsule 0   melatonin 5 MG TABS Take 5 mg by mouth at bedtime.     omeprazole (PRILOSEC) 40 MG capsule Take 40 mg by mouth daily.     ondansetron  (ZOFRAN ) 8 MG tablet Take 1 tablet (8 mg total) by mouth every 8 (eight) hours as needed for nausea or vomiting. 30 tablet 1   potassium chloride  SA (KLOR-CON  M) 20 MEQ tablet Take 1 tablet (20 mEq total) by mouth 2 (two) times daily. 60 tablet 2   pravastatin (PRAVACHOL) 80 MG tablet Take 80 mg by mouth at bedtime.     prochlorperazine  (COMPAZINE ) 10 MG tablet Take 1 tablet (10 mg total) by mouth every 6 (six) hours as needed for nausea or vomiting. 30 tablet 1   pyridoxine (B-6) 100 MG tablet Take 100 mg by mouth daily.     No current facility-administered medications for this visit.    REVIEW OF SYSTEMS:   Constitutional: ( - ) fevers, ( - )  chills , ( - ) night sweats Eyes:  ( - ) blurriness of vision, ( - ) double vision, ( - ) watery eyes Ears, nose, mouth, throat, and face: ( - ) mucositis, ( - ) sore throat Respiratory: ( - ) cough, ( - ) dyspnea, ( - ) wheezes Cardiovascular: ( - ) palpitation, ( - ) chest discomfort, ( - ) lower extremity swelling Gastrointestinal:  ( - ) nausea, ( - ) heartburn, ( - ) change in bowel habits Skin: ( - ) abnormal skin rashes Lymphatics: ( - ) new lymphadenopathy, ( - ) easy bruising Neurological: ( - ) numbness, ( - ) tingling, ( - ) new weaknesses Behavioral/Psych: ( - ) mood change, ( - ) new changes  All other systems were reviewed with the patient and are negative.  PHYSICAL EXAMINATION:  Vitals:   10/05/23 1155  BP: (!) 143/75  Pulse: 81  Resp: 16  Temp: 97.9 F (36.6 C)  SpO2: 100%     Filed Weights   10/05/23 1155  Weight: 227 lb 3.2 oz (103.1 kg)      GENERAL: Well-appearing elderly Caucasian male, alert, no distress and comfortable SKIN: skin color, texture, turgor are normal, no rashes or significant lesions EYES: conjunctiva are pink and non-injected, sclera clear LUNGS: clear to auscultation and percussion with normal breathing effort HEART: regular rate & rhythm and no murmurs and no lower extremity edema Musculoskeletal: no cyanosis of digits and no clubbing  PSYCH: alert & oriented x 3, fluent speech NEURO: no focal motor/sensory deficits  LABORATORY DATA:  I have reviewed the data as listed    Latest Ref Rng & Units 10/05/2023   11:11 AM 09/28/2023    3:07 PM 09/21/2023    1:39 PM  CBC  WBC 4.0 - 10.5 K/uL 5.4  5.9  11.7   Hemoglobin 13.0 - 17.0 g/dL 89.6  88.7  88.8   Hematocrit 39.0 - 52.0 % 30.9  32.6  31.8  Platelets 150 - 400 K/uL 248  219  197        Latest Ref Rng & Units 10/05/2023   11:11 AM 09/28/2023    3:07 PM 09/21/2023    1:39 PM  CMP  Glucose 70 - 99 mg/dL 886  804  843   BUN 8 - 23 mg/dL 7  8  5    Creatinine 0.61 - 1.24 mg/dL 9.40  9.33  9.33   Sodium 135 - 145  mmol/L 137  138  136   Potassium 3.5 - 5.1 mmol/L 4.3  4.7  3.0   Chloride 98 - 111 mmol/L 103  104  103   CO2 22 - 32 mmol/L 29  27  24    Calcium  8.9 - 10.3 mg/dL 7.8  6.8  6.0   Total Protein 6.5 - 8.1 g/dL 5.2  5.7  5.1   Total Bilirubin 0.0 - 1.2 mg/dL 0.4  0.3  0.4   Alkaline Phos 38 - 126 U/L 60  60  62   AST 15 - 41 U/L 37  39  32   ALT 0 - 44 U/L 35  36  33     Lab Results  Component Value Date   MPROTEIN Not Observed 09/28/2023   MPROTEIN Not Observed 09/07/2023   MPROTEIN Not Observed 08/17/2023   Lab Results  Component Value Date   KPAFRELGTCHN 48.0 (H) 09/28/2023   KPAFRELGTCHN 41.8 (H) 09/07/2023   KPAFRELGTCHN 347.1 (H) 08/17/2023   LAMBDASER 31.4 (H) 09/28/2023   LAMBDASER 17.4 09/07/2023   LAMBDASER 22.3 08/17/2023   KAPLAMBRATIO 1.53 09/28/2023   KAPLAMBRATIO 2.40 (H) 09/07/2023   KAPLAMBRATIO 15.57 (H) 08/17/2023   RADIOGRAPHIC STUDIES: No results found.  ASSESSMENT & PLAN David Mcgrath 73 y.o. male with medical history significant for IgG Kappa MM who presents for a follow up visit.   #IgG Kappa Multiple Myeloma: -Initially presented with scattered bony lytic lesions on CT CAP from 06/08/2023.  -Baseline labs from 07/13/2023 included SPEP detecting M protein measuring 0.3 g/dL. Kappa light chain elevated to 512.1, lambda light chain 20.3, ratio 25.23.  -Bone met survey from 07/20/2023 showed rxpansile lucent lesions in the right T1 transverse process, posterior right sixth rib, and left posteromedial fourth rib -Bone marrow biopsy confirmed plasma cell neoplasm measuring 28% of all cells.  -Recommend Velcade  plus Dexamethasone  therapy, started on 08/17/2023. Added Revlimid  during Cycle 2  PLAN: --Due for Cycle 3, Day 8 of Velcade /Rev/Dexamethasone  today.  Revlimid  added with cycle 2 --Labs from today were reviewed and adequate for treatment. WBC 5.4, Hgb 10.3, MCV 93.4, Plt 248, Creatinine normal.  --Proceed with weekly treatments, dropped Velcade   to 1 mg/m due to diarrhea --Currently patient is receiving 40 mg p.o. Dex on treatment days (is experiencing insomnia)  --revlimid  25 mg PO on pause x 2 weeks.  Will restart at Revlimid  10 mg p.o. daily 21 days on and 7 days off. --RTC in 2 weeks for a toxicity check   #Diarrhea -- Modest improvement with dose reduction, patient not taking Lomotil  -- If we are unable to control his diarrhea could consider discontinuing Velcade  and transitioning to Darzalex -- Patient maintains good appetite and hydration. -- Continue to monitor  # Peripheral neuropathy: --Evaluated by neurology before MM diagnosis.  --EMG nerve conduction study done on 01/30/2020 by Dr. Margaret confirms severe axonal sensorimotor polyneuropathy as well as moderate right carpal tunnel.  --Currently on gabapentin  600 mg TID.  --Monitor closely with initiation of velcade   as neuropathy can worsen.    #Supportive Care -- chemotherapy education to be scheduled  -- zofran  8mg  q8H PRN and compazine  10mg  PO q6H for nausea -- acyclovir  400mg  PO BID for VCZ prophylaxis --  added Zometa , we have dental clearance on file. First dose on 09/07/2023.  Due again in September 2025 -- no pain medication required at this time.    #Age related screenings -Colorectal screening UTD. -PSA from 07/13/2023 was normal at 0.4.   No orders of the defined types were placed in this encounter.   All questions were answered. The patient knows to call the clinic with any problems, questions or concerns.  A total of more than 30 minutes were spent on this encounter with face-to-face time and non-face-to-face time, including preparing to see the patient, ordering tests and/or medications, counseling the patient and coordination of care as outlined above.   David IVAR Kidney, MD Department of Hematology/Oncology [facility] Phone: [phone] Pager: [phone] Email: [email or web address]  10/08/2023  6:27 PM

## 2023-10-05 NOTE — Progress Notes (Signed)
 Nutrition follow-up completed with patient in the infusion room.  Patient diagnosed with multiple myeloma and is followed by Dr. Federico.  Revlimid  is on hold.  Patient receiving Velcade .  Labs include glucose 113, BUN 7, creatinine 0.59, and albumin 2.8.  Weight:  227 pounds 3.2 ounces July 3. 204 pounds 8 ounces June 5. 213 pounds 9.6 ounces May 6  250 pounds 4.8 ounces March 5  Patient feels much better and says diarrhea has completely resolved.  Says stools are softly formed.  He used Banatrol plus twice daily and has ordered more.  Reports he is eating okay denies other nutrition impact symptoms.  MD is resuming treatment at a lower dose.  Nutrition diagnosis:  Unintentional weight loss cannot be evaluated due to edema.  Intervention: Educated to continue Banatrol plus as needed. Follow low fiber diet if diarrhea resumes. Continue to work to increase protein. Continue strategies to improve edema per MD.  Monitoring, evaluation, goals: Patient will tolerate adequate calories and protein to maintain lean body mass.  Next visit: Thursday, July 31 during infusion.  **Disclaimer: This note was dictated with voice recognition software. Similar sounding words can inadvertently be transcribed and this note may contain transcription errors which may not have been corrected upon publication of note.**

## 2023-10-05 NOTE — Patient Instructions (Signed)
 [facility]  Discharge Instructions: Thank you for choosing [facility] to provide your oncology and hematology care.   If you have a lab appointment with the Cancer Center, please go directly to the Cancer Center and check in at the registration area.   Wear comfortable clothing and clothing appropriate for easy access to any Portacath or PICC line.   We strive to give you quality time with your provider. You may need to reschedule your appointment if you arrive late (15 or more minutes).  Arriving late affects you and other patients whose appointments are after yours.  Also, if you miss three or more appointments without notifying the office, you may be dismissed from the clinic at the provider's discretion.      For prescription refill requests, have your pharmacy contact our office and allow 72 hours for refills to be completed.    Today you received the following chemotherapy and/or immunotherapy agents: Velcade    To help prevent nausea and vomiting after your treatment, we encourage you to take your nausea medication as directed.  BELOW ARE SYMPTOMS THAT SHOULD BE REPORTED IMMEDIATELY: *FEVER GREATER THAN 100.4 F (38 C) OR HIGHER *CHILLS OR SWEATING *NAUSEA AND VOMITING THAT IS NOT CONTROLLED WITH YOUR NAUSEA MEDICATION *UNUSUAL SHORTNESS OF BREATH *UNUSUAL BRUISING OR BLEEDING *URINARY PROBLEMS (pain or burning when urinating, or frequent urination) *BOWEL PROBLEMS (unusual diarrhea, constipation, pain near the anus) TENDERNESS IN MOUTH AND THROAT WITH OR WITHOUT PRESENCE OF ULCERS (sore throat, sores in mouth, or a toothache) UNUSUAL RASH, SWELLING OR PAIN  UNUSUAL VAGINAL DISCHARGE OR ITCHING   Items with * indicate a potential emergency and should be followed up as soon as possible or go to the Emergency Department if any problems should occur.  Please show the CHEMOTHERAPY ALERT CARD or IMMUNOTHERAPY  ALERT CARD at check-in to the Emergency Department and triage nurse.  Should you have questions after your visit or need to cancel or reschedule your appointment, please contact [facility] - [facility]  Dept: [phone]  and follow the prompts.  Office hours are 8:00 a.m. to 4:30 p.m. Monday - Friday. Please note that voicemails left after 4:00 p.m. may not be returned until the following business day.  We are closed weekends and major holidays. You have access to a nurse at all times for urgent questions. Please call the main number to the clinic Dept: [phone] and follow the prompts.   For any non-urgent questions, you may also contact your provider using MyChart. We now offer e-Visits for anyone 69 and older to request care online for non-urgent symptoms. For details visit [email or web address].   Also download the MyChart app! Go to the app store, search "MyChart", open the app, select , and log in with your MyChart username and password.

## 2023-10-08 ENCOUNTER — Encounter: Payer: Self-pay | Admitting: Hematology and Oncology

## 2023-10-12 ENCOUNTER — Other Ambulatory Visit: Payer: Self-pay | Admitting: Hematology and Oncology

## 2023-10-12 ENCOUNTER — Inpatient Hospital Stay

## 2023-10-12 ENCOUNTER — Other Ambulatory Visit

## 2023-10-12 VITALS — BP 134/80 | HR 98 | Temp 97.6°F | Resp 16 | Wt 225.5 lb

## 2023-10-12 DIAGNOSIS — C9000 Multiple myeloma not having achieved remission: Secondary | ICD-10-CM

## 2023-10-12 DIAGNOSIS — R112 Nausea with vomiting, unspecified: Secondary | ICD-10-CM | POA: Diagnosis not present

## 2023-10-12 DIAGNOSIS — K56609 Unspecified intestinal obstruction, unspecified as to partial versus complete obstruction: Secondary | ICD-10-CM | POA: Diagnosis not present

## 2023-10-12 DIAGNOSIS — K760 Fatty (change of) liver, not elsewhere classified: Secondary | ICD-10-CM | POA: Diagnosis not present

## 2023-10-12 DIAGNOSIS — Z79899 Other long term (current) drug therapy: Secondary | ICD-10-CM | POA: Diagnosis not present

## 2023-10-12 DIAGNOSIS — K219 Gastro-esophageal reflux disease without esophagitis: Secondary | ICD-10-CM | POA: Diagnosis not present

## 2023-10-12 DIAGNOSIS — R1110 Vomiting, unspecified: Secondary | ICD-10-CM | POA: Diagnosis not present

## 2023-10-12 DIAGNOSIS — K5669 Other partial intestinal obstruction: Secondary | ICD-10-CM | POA: Diagnosis not present

## 2023-10-12 DIAGNOSIS — E860 Dehydration: Secondary | ICD-10-CM | POA: Diagnosis not present

## 2023-10-12 DIAGNOSIS — R0989 Other specified symptoms and signs involving the circulatory and respiratory systems: Secondary | ICD-10-CM | POA: Diagnosis not present

## 2023-10-12 DIAGNOSIS — Z833 Family history of diabetes mellitus: Secondary | ICD-10-CM | POA: Diagnosis not present

## 2023-10-12 DIAGNOSIS — M899 Disorder of bone, unspecified: Secondary | ICD-10-CM | POA: Diagnosis not present

## 2023-10-12 DIAGNOSIS — E876 Hypokalemia: Secondary | ICD-10-CM | POA: Diagnosis not present

## 2023-10-12 DIAGNOSIS — I10 Essential (primary) hypertension: Secondary | ICD-10-CM | POA: Diagnosis not present

## 2023-10-12 DIAGNOSIS — G4733 Obstructive sleep apnea (adult) (pediatric): Secondary | ICD-10-CM | POA: Diagnosis not present

## 2023-10-12 DIAGNOSIS — R188 Other ascites: Secondary | ICD-10-CM | POA: Diagnosis not present

## 2023-10-12 DIAGNOSIS — Z4682 Encounter for fitting and adjustment of non-vascular catheter: Secondary | ICD-10-CM | POA: Diagnosis not present

## 2023-10-12 DIAGNOSIS — M109 Gout, unspecified: Secondary | ICD-10-CM | POA: Diagnosis not present

## 2023-10-12 DIAGNOSIS — F32A Depression, unspecified: Secondary | ICD-10-CM | POA: Diagnosis not present

## 2023-10-12 DIAGNOSIS — G629 Polyneuropathy, unspecified: Secondary | ICD-10-CM | POA: Diagnosis not present

## 2023-10-12 DIAGNOSIS — E785 Hyperlipidemia, unspecified: Secondary | ICD-10-CM | POA: Diagnosis not present

## 2023-10-12 DIAGNOSIS — Z7901 Long term (current) use of anticoagulants: Secondary | ICD-10-CM | POA: Diagnosis not present

## 2023-10-12 DIAGNOSIS — F419 Anxiety disorder, unspecified: Secondary | ICD-10-CM | POA: Diagnosis not present

## 2023-10-12 DIAGNOSIS — D72829 Elevated white blood cell count, unspecified: Secondary | ICD-10-CM | POA: Diagnosis not present

## 2023-10-12 DIAGNOSIS — K6389 Other specified diseases of intestine: Secondary | ICD-10-CM | POA: Diagnosis not present

## 2023-10-12 DIAGNOSIS — I480 Paroxysmal atrial fibrillation: Secondary | ICD-10-CM | POA: Diagnosis not present

## 2023-10-12 DIAGNOSIS — Z85828 Personal history of other malignant neoplasm of skin: Secondary | ICD-10-CM | POA: Diagnosis not present

## 2023-10-12 DIAGNOSIS — R918 Other nonspecific abnormal finding of lung field: Secondary | ICD-10-CM | POA: Diagnosis not present

## 2023-10-12 DIAGNOSIS — K567 Ileus, unspecified: Secondary | ICD-10-CM | POA: Diagnosis not present

## 2023-10-12 DIAGNOSIS — G603 Idiopathic progressive neuropathy: Secondary | ICD-10-CM | POA: Diagnosis not present

## 2023-10-12 DIAGNOSIS — Z91013 Allergy to seafood: Secondary | ICD-10-CM | POA: Diagnosis not present

## 2023-10-12 DIAGNOSIS — D649 Anemia, unspecified: Secondary | ICD-10-CM | POA: Diagnosis not present

## 2023-10-12 DIAGNOSIS — K5900 Constipation, unspecified: Secondary | ICD-10-CM | POA: Diagnosis not present

## 2023-10-12 DIAGNOSIS — Z8249 Family history of ischemic heart disease and other diseases of the circulatory system: Secondary | ICD-10-CM | POA: Diagnosis not present

## 2023-10-12 DIAGNOSIS — K226 Gastro-esophageal laceration-hemorrhage syndrome: Secondary | ICD-10-CM | POA: Diagnosis not present

## 2023-10-12 DIAGNOSIS — J9811 Atelectasis: Secondary | ICD-10-CM | POA: Diagnosis not present

## 2023-10-12 DIAGNOSIS — Z83438 Family history of other disorder of lipoprotein metabolism and other lipidemia: Secondary | ICD-10-CM | POA: Diagnosis not present

## 2023-10-12 LAB — CMP (CANCER CENTER ONLY)
ALT: 35 U/L (ref 0–44)
AST: 37 U/L (ref 15–41)
Albumin: 3.4 g/dL — ABNORMAL LOW (ref 3.5–5.0)
Alkaline Phosphatase: 54 U/L (ref 38–126)
Anion gap: 8 (ref 5–15)
BUN: 9 mg/dL (ref 8–23)
CO2: 24 mmol/L (ref 22–32)
Calcium: 8.6 mg/dL — ABNORMAL LOW (ref 8.9–10.3)
Chloride: 104 mmol/L (ref 98–111)
Creatinine: 0.67 mg/dL (ref 0.61–1.24)
GFR, Estimated: >60 mL/min (ref >60)
Glucose, Bld: 194 mg/dL — ABNORMAL HIGH (ref 70–99)
Potassium: 4.1 mmol/L (ref 3.5–5.1)
Sodium: 136 mmol/L (ref 135–145)
Total Bilirubin: 0.3 mg/dL (ref 0.0–1.2)
Total Protein: 6.2 g/dL — ABNORMAL LOW (ref 6.5–8.1)

## 2023-10-12 LAB — CBC WITH DIFFERENTIAL (CANCER CENTER ONLY)
Abs Immature Granulocytes: 0.03 K/uL (ref 0.00–0.07)
Basophils Absolute: 0 K/uL (ref 0.0–0.1)
Basophils Relative: 0 %
Eosinophils Absolute: 0 K/uL (ref 0.0–0.5)
Eosinophils Relative: 0 %
HCT: 33.7 % — ABNORMAL LOW (ref 39.0–52.0)
Hemoglobin: 11.4 g/dL — ABNORMAL LOW (ref 13.0–17.0)
Immature Granulocytes: 0 %
Lymphocytes Relative: 2 %
Lymphs Abs: 0.2 K/uL — ABNORMAL LOW (ref 0.7–4.0)
MCH: 31.5 pg (ref 26.0–34.0)
MCHC: 33.8 g/dL (ref 30.0–36.0)
MCV: 93.1 fL (ref 80.0–100.0)
Monocytes Absolute: 0.1 K/uL (ref 0.1–1.0)
Monocytes Relative: 1 %
Neutro Abs: 7.8 K/uL — ABNORMAL HIGH (ref 1.7–7.7)
Neutrophils Relative %: 97 %
Platelet Count: 288 K/uL (ref 150–400)
RBC: 3.62 MIL/uL — ABNORMAL LOW (ref 4.22–5.81)
RDW: 15.9 % — ABNORMAL HIGH (ref 11.5–15.5)
WBC Count: 8.1 K/uL (ref 4.0–10.5)
nRBC: 0 % (ref 0.0–0.2)

## 2023-10-12 MED ORDER — BORTEZOMIB CHEMO SQ INJECTION 3.5 MG (2.5MG/ML)
1.3000 mg/m2 | Freq: Once | INTRAMUSCULAR | Status: AC
  Administered 2023-10-12: 3 mg via SUBCUTANEOUS
  Filled 2023-10-12: qty 1.2

## 2023-10-12 NOTE — Patient Instructions (Signed)
 [facility] - A DEPT OF [facility]  Discharge Instructions: Thank you for choosing [facility] to provide your oncology and hematology care.   If you have a lab appointment with the Cancer Center, please go directly to the Cancer Center and check in at the registration area.   Wear comfortable clothing and clothing appropriate for easy access to any Portacath or PICC line.   We strive to give you quality time with your provider. You may need to reschedule your appointment if you arrive late (15 or more minutes).  Arriving late affects you and other patients whose appointments are after yours.  Also, if you miss three or more appointments without notifying the office, you may be dismissed from the clinic at the provider's discretion.      For prescription refill requests, have your pharmacy contact our office and allow 72 hours for refills to be completed.    Today you received the following chemotherapy and/or immunotherapy agents velcade      To help prevent nausea and vomiting after your treatment, we encourage you to take your nausea medication as directed.  BELOW ARE SYMPTOMS THAT SHOULD BE REPORTED IMMEDIATELY: *FEVER GREATER THAN 100.4 F (38 C) OR HIGHER *CHILLS OR SWEATING *NAUSEA AND VOMITING THAT IS NOT CONTROLLED WITH YOUR NAUSEA MEDICATION *UNUSUAL SHORTNESS OF BREATH *UNUSUAL BRUISING OR BLEEDING *URINARY PROBLEMS (pain or burning when urinating, or frequent urination) *BOWEL PROBLEMS (unusual diarrhea, constipation, pain near the anus) TENDERNESS IN MOUTH AND THROAT WITH OR WITHOUT PRESENCE OF ULCERS (sore throat, sores in mouth, or a toothache) UNUSUAL RASH, SWELLING OR PAIN  UNUSUAL VAGINAL DISCHARGE OR ITCHING   Items with * indicate a potential emergency and should be followed up as soon as possible or go to the Emergency Department if any problems should occur.  Please show the CHEMOTHERAPY ALERT CARD or IMMUNOTHERAPY  ALERT CARD at check-in to the Emergency Department and triage nurse.  Should you have questions after your visit or need to cancel or reschedule your appointment, please contact [facility] - A DEPT OF [facility]  Dept: [phone]  and follow the prompts.  Office hours are 8:00 a.m. to 4:30 p.m. Monday - Friday. Please note that voicemails left after 4:00 p.m. may not be returned until the following business day.  We are closed weekends and major holidays. You have access to a nurse at all times for urgent questions. Please call the main number to the clinic Dept: [phone] and follow the prompts.   For any non-urgent questions, you may also contact your provider using MyChart. We now offer e-Visits for anyone 85 and older to request care online for non-urgent symptoms. For details visit [email or web address].   Also download the MyChart app! Go to the app store, search "MyChart", open the app, select [place], and log in with your MyChart username and password.

## 2023-10-13 ENCOUNTER — Other Ambulatory Visit: Payer: Self-pay

## 2023-10-13 DIAGNOSIS — F419 Anxiety disorder, unspecified: Secondary | ICD-10-CM | POA: Diagnosis not present

## 2023-10-13 DIAGNOSIS — Z7901 Long term (current) use of anticoagulants: Secondary | ICD-10-CM | POA: Diagnosis not present

## 2023-10-13 DIAGNOSIS — K219 Gastro-esophageal reflux disease without esophagitis: Secondary | ICD-10-CM | POA: Diagnosis not present

## 2023-10-13 DIAGNOSIS — I10 Essential (primary) hypertension: Secondary | ICD-10-CM | POA: Diagnosis not present

## 2023-10-13 DIAGNOSIS — K760 Fatty (change of) liver, not elsewhere classified: Secondary | ICD-10-CM | POA: Diagnosis not present

## 2023-10-13 DIAGNOSIS — Z85828 Personal history of other malignant neoplasm of skin: Secondary | ICD-10-CM | POA: Diagnosis not present

## 2023-10-13 DIAGNOSIS — M899 Disorder of bone, unspecified: Secondary | ICD-10-CM | POA: Diagnosis not present

## 2023-10-13 DIAGNOSIS — G603 Idiopathic progressive neuropathy: Secondary | ICD-10-CM | POA: Diagnosis not present

## 2023-10-13 DIAGNOSIS — M109 Gout, unspecified: Secondary | ICD-10-CM | POA: Diagnosis not present

## 2023-10-13 DIAGNOSIS — G4733 Obstructive sleep apnea (adult) (pediatric): Secondary | ICD-10-CM | POA: Diagnosis not present

## 2023-10-13 DIAGNOSIS — E785 Hyperlipidemia, unspecified: Secondary | ICD-10-CM | POA: Diagnosis not present

## 2023-10-14 ENCOUNTER — Encounter (HOSPITAL_COMMUNITY): Payer: Self-pay | Admitting: Family Medicine

## 2023-10-14 ENCOUNTER — Emergency Department (HOSPITAL_COMMUNITY)

## 2023-10-14 ENCOUNTER — Inpatient Hospital Stay (HOSPITAL_COMMUNITY)
Admission: EM | Admit: 2023-10-14 | Discharge: 2023-10-20 | DRG: 388 | Disposition: A | Discharge status: Home Health | Attending: Internal Medicine | Admitting: Internal Medicine

## 2023-10-14 ENCOUNTER — Other Ambulatory Visit: Payer: Self-pay

## 2023-10-14 DIAGNOSIS — R188 Other ascites: Secondary | ICD-10-CM | POA: Diagnosis present

## 2023-10-14 DIAGNOSIS — I10 Essential (primary) hypertension: Secondary | ICD-10-CM | POA: Diagnosis present

## 2023-10-14 DIAGNOSIS — G4733 Obstructive sleep apnea (adult) (pediatric): Secondary | ICD-10-CM | POA: Diagnosis present

## 2023-10-14 DIAGNOSIS — Z833 Family history of diabetes mellitus: Secondary | ICD-10-CM | POA: Diagnosis not present

## 2023-10-14 DIAGNOSIS — Z7901 Long term (current) use of anticoagulants: Secondary | ICD-10-CM

## 2023-10-14 DIAGNOSIS — Z83438 Family history of other disorder of lipoprotein metabolism and other lipidemia: Secondary | ICD-10-CM | POA: Diagnosis not present

## 2023-10-14 DIAGNOSIS — Z79890 Hormone replacement therapy (postmenopausal): Secondary | ICD-10-CM

## 2023-10-14 DIAGNOSIS — Z79899 Other long term (current) drug therapy: Secondary | ICD-10-CM

## 2023-10-14 DIAGNOSIS — I480 Paroxysmal atrial fibrillation: Secondary | ICD-10-CM | POA: Diagnosis present

## 2023-10-14 DIAGNOSIS — C9000 Multiple myeloma not having achieved remission: Secondary | ICD-10-CM | POA: Diagnosis present

## 2023-10-14 DIAGNOSIS — G629 Polyneuropathy, unspecified: Secondary | ICD-10-CM | POA: Diagnosis present

## 2023-10-14 DIAGNOSIS — K226 Gastro-esophageal laceration-hemorrhage syndrome: Secondary | ICD-10-CM | POA: Diagnosis present

## 2023-10-14 DIAGNOSIS — Z8249 Family history of ischemic heart disease and other diseases of the circulatory system: Secondary | ICD-10-CM

## 2023-10-14 DIAGNOSIS — E876 Hypokalemia: Secondary | ICD-10-CM | POA: Diagnosis present

## 2023-10-14 DIAGNOSIS — K56609 Unspecified intestinal obstruction, unspecified as to partial versus complete obstruction: Secondary | ICD-10-CM | POA: Diagnosis present

## 2023-10-14 DIAGNOSIS — D72829 Elevated white blood cell count, unspecified: Secondary | ICD-10-CM | POA: Diagnosis present

## 2023-10-14 DIAGNOSIS — K920 Hematemesis: Principal | ICD-10-CM

## 2023-10-14 DIAGNOSIS — E860 Dehydration: Secondary | ICD-10-CM | POA: Diagnosis present

## 2023-10-14 DIAGNOSIS — E785 Hyperlipidemia, unspecified: Secondary | ICD-10-CM | POA: Diagnosis present

## 2023-10-14 DIAGNOSIS — R1110 Vomiting, unspecified: Secondary | ICD-10-CM

## 2023-10-14 DIAGNOSIS — F32A Depression, unspecified: Secondary | ICD-10-CM | POA: Diagnosis present

## 2023-10-14 DIAGNOSIS — D649 Anemia, unspecified: Secondary | ICD-10-CM | POA: Diagnosis present

## 2023-10-14 DIAGNOSIS — K567 Ileus, unspecified: Secondary | ICD-10-CM | POA: Diagnosis present

## 2023-10-14 DIAGNOSIS — Z91013 Allergy to seafood: Secondary | ICD-10-CM | POA: Diagnosis not present

## 2023-10-14 LAB — CBC WITH DIFFERENTIAL/PLATELET
Abs Immature Granulocytes: 0.07 K/uL (ref 0.00–0.07)
Basophils Absolute: 0 K/uL (ref 0.0–0.1)
Basophils Relative: 0 %
Eosinophils Absolute: 0 K/uL (ref 0.0–0.5)
Eosinophils Relative: 0 %
HCT: 33.6 % — ABNORMAL LOW (ref 39.0–52.0)
Hemoglobin: 10.8 g/dL — ABNORMAL LOW (ref 13.0–17.0)
Immature Granulocytes: 0 %
Lymphocytes Relative: 3 %
Lymphs Abs: 0.4 K/uL — ABNORMAL LOW (ref 0.7–4.0)
MCH: 31.1 pg (ref 26.0–34.0)
MCHC: 32.1 g/dL (ref 30.0–36.0)
MCV: 96.8 fL (ref 80.0–100.0)
Monocytes Absolute: 1.4 K/uL — ABNORMAL HIGH (ref 0.1–1.0)
Monocytes Relative: 8 %
Neutro Abs: 14.3 K/uL — ABNORMAL HIGH (ref 1.7–7.7)
Neutrophils Relative %: 89 %
Platelets: 260 K/uL (ref 150–400)
RBC: 3.47 MIL/uL — ABNORMAL LOW (ref 4.22–5.81)
RDW: 16.6 % — ABNORMAL HIGH (ref 11.5–15.5)
WBC: 16.2 K/uL — ABNORMAL HIGH (ref 4.0–10.5)
nRBC: 0 % (ref 0.0–0.2)

## 2023-10-14 LAB — CBC
HCT: 33.4 % — ABNORMAL LOW (ref 39.0–52.0)
HCT: 30.2 % — ABNORMAL LOW (ref 39.0–52.0)
Hemoglobin: 10.8 g/dL — ABNORMAL LOW (ref 13.0–17.0)
Hemoglobin: 10 g/dL — ABNORMAL LOW (ref 13.0–17.0)
MCH: 31.4 pg (ref 26.0–34.0)
MCH: 31.9 pg (ref 26.0–34.0)
MCHC: 32.3 g/dL (ref 30.0–36.0)
MCHC: 33.1 g/dL (ref 30.0–36.0)
MCV: 97.1 fL (ref 80.0–100.0)
MCV: 96.5 fL (ref 80.0–100.0)
Platelets: 248 K/uL (ref 150–400)
Platelets: 218 K/uL (ref 150–400)
RBC: 3.44 MIL/uL — ABNORMAL LOW (ref 4.22–5.81)
RBC: 3.13 MIL/uL — ABNORMAL LOW (ref 4.22–5.81)
RDW: 16.6 % — ABNORMAL HIGH (ref 11.5–15.5)
RDW: 16.5 % — ABNORMAL HIGH (ref 11.5–15.5)
WBC: 13.3 K/uL — ABNORMAL HIGH (ref 4.0–10.5)
WBC: 14.1 K/uL — ABNORMAL HIGH (ref 4.0–10.5)
nRBC: 0 % (ref 0.0–0.2)
nRBC: 0 % (ref 0.0–0.2)

## 2023-10-14 LAB — URINALYSIS, ROUTINE W REFLEX MICROSCOPIC
Bacteria, UA: NONE SEEN
Bilirubin Urine: NEGATIVE
Glucose, UA: NEGATIVE mg/dL
Ketones, ur: NEGATIVE mg/dL
Nitrite: NEGATIVE
Protein, ur: 100 mg/dL — AB
Specific Gravity, Urine: >1.046 — ABNORMAL HIGH (ref 1.005–1.030)
WBC, UA: >50 WBC/hpf (ref 0–5)
pH: 8 (ref 5.0–8.0)

## 2023-10-14 LAB — ABO/RH: ABO/RH(D): O POS

## 2023-10-14 LAB — COMPREHENSIVE METABOLIC PANEL WITH GFR
ALT: 33 U/L (ref 0–44)
AST: 33 U/L (ref 15–41)
Albumin: 2.7 g/dL — ABNORMAL LOW (ref 3.5–5.0)
Alkaline Phosphatase: 45 U/L (ref 38–126)
Anion gap: 11 (ref 5–15)
BUN: 14 mg/dL (ref 8–23)
CO2: 30 mmol/L (ref 22–32)
Calcium: 8.6 mg/dL — ABNORMAL LOW (ref 8.9–10.3)
Chloride: 96 mmol/L — ABNORMAL LOW (ref 98–111)
Creatinine, Ser: 0.72 mg/dL (ref 0.61–1.24)
GFR, Estimated: >60 mL/min
Glucose, Bld: 106 mg/dL — ABNORMAL HIGH (ref 70–99)
Potassium: 3.3 mmol/L — ABNORMAL LOW (ref 3.5–5.1)
Sodium: 137 mmol/L (ref 135–145)
Total Bilirubin: 0.4 mg/dL (ref 0.0–1.2)
Total Protein: 5.4 g/dL — ABNORMAL LOW (ref 6.5–8.1)

## 2023-10-14 LAB — TYPE AND SCREEN
ABO/RH(D): O POS
Antibody Screen: NEGATIVE

## 2023-10-14 LAB — OCCULT BLOOD GASTRIC / DUODENUM (SPECIMEN CUP)
Occult Blood, Gastric: POSITIVE — AB
pH, Gastric: 3

## 2023-10-14 LAB — PROTIME-INR
INR: 1.4 — ABNORMAL HIGH (ref 0.8–1.2)
Prothrombin Time: 17.5 s — ABNORMAL HIGH (ref 11.4–15.2)

## 2023-10-14 LAB — LACTIC ACID, PLASMA
Lactic Acid, Venous: 1.9 mmol/L (ref 0.5–1.9)
Lactic Acid, Venous: 1.7 mmol/L (ref 0.5–1.9)

## 2023-10-14 LAB — MAGNESIUM: Magnesium: 1.5 mg/dL — ABNORMAL LOW (ref 1.7–2.4)

## 2023-10-14 MED ORDER — MORPHINE SULFATE (PF) 2 MG/ML IV SOLN
2.0000 mg | INTRAVENOUS | Status: DC | PRN
  Administered 2023-10-15 – 2023-10-17 (×13): 2 mg via INTRAVENOUS
  Filled 2023-10-14 (×14): qty 1

## 2023-10-14 MED ORDER — PANTOPRAZOLE SODIUM 40 MG IV SOLR
40.0000 mg | Freq: Two times a day (BID) | INTRAVENOUS | Status: DC
  Administered 2023-10-14 – 2023-10-20 (×12): 40 mg via INTRAVENOUS
  Filled 2023-10-14 (×12): qty 10

## 2023-10-14 MED ORDER — DIPHENHYDRAMINE HCL 50 MG/ML IJ SOLN: 25.0000 mg | Freq: Once | INTRAMUSCULAR | Status: AC

## 2023-10-14 MED ORDER — ONDANSETRON HCL 4 MG/2ML IJ SOLN
4.0000 mg | Freq: Once | INTRAMUSCULAR | Status: AC
  Administered 2023-10-14: 4 mg via INTRAVENOUS
  Filled 2023-10-14: qty 2

## 2023-10-14 MED ORDER — DIPHENHYDRAMINE HCL 50 MG/ML IJ SOLN
INTRAMUSCULAR | Status: AC
  Administered 2023-10-14: 25 mg via INTRAVENOUS
  Filled 2023-10-14: qty 1

## 2023-10-14 MED ORDER — SODIUM CHLORIDE 0.9 % IV SOLN: INTRAVENOUS | Status: AC

## 2023-10-14 MED ORDER — POTASSIUM CHLORIDE 10 MEQ/100ML IV SOLN
10.0000 meq | Freq: Once | INTRAVENOUS | Status: AC
  Administered 2023-10-14: 10 meq via INTRAVENOUS
  Filled 2023-10-14: qty 100

## 2023-10-14 MED ORDER — LACTATED RINGERS IV BOLUS
1000.0000 mL | Freq: Once | INTRAVENOUS | Status: AC
  Administered 2023-10-14: 1000 mL via INTRAVENOUS

## 2023-10-14 MED ORDER — ACETAMINOPHEN 325 MG PO TABS
650.0000 mg | ORAL_TABLET | Freq: Four times a day (QID) | ORAL | Status: DC | PRN
  Filled 2023-10-14 (×2): qty 2

## 2023-10-14 MED ORDER — MAGNESIUM SULFATE 2 GM/50ML IV SOLN
2.0000 g | Freq: Once | INTRAVENOUS | Status: AC
  Administered 2023-10-14: 2 g via INTRAVENOUS
  Filled 2023-10-14: qty 50

## 2023-10-14 MED ORDER — IOHEXOL 350 MG/ML SOLN
100.0000 mL | Freq: Once | INTRAVENOUS | Status: AC | PRN
  Administered 2023-10-14: 100 mL via INTRAVENOUS

## 2023-10-14 MED ORDER — PANTOPRAZOLE SODIUM 40 MG IV SOLR
40.0000 mg | Freq: Once | INTRAVENOUS | Status: DC
  Filled 2023-10-14: qty 10

## 2023-10-14 MED ORDER — METOPROLOL TARTRATE 5 MG/5ML IV SOLN: 2.5000 mg | INTRAVENOUS | Status: DC | PRN

## 2023-10-14 MED ORDER — ONDANSETRON HCL 4 MG/2ML IJ SOLN
4.0000 mg | Freq: Four times a day (QID) | INTRAMUSCULAR | Status: DC | PRN
  Filled 2023-10-14: qty 2

## 2023-10-14 MED ORDER — ACETAMINOPHEN 650 MG RE SUPP
650.0000 mg | Freq: Four times a day (QID) | RECTAL | Status: DC | PRN
Start: 2023-10-14 — End: 2023-10-20

## 2023-10-14 MED ORDER — METOCLOPRAMIDE HCL 5 MG/ML IJ SOLN
10.0000 mg | Freq: Once | INTRAMUSCULAR | Status: AC
  Administered 2023-10-14: 10 mg via INTRAVENOUS
  Filled 2023-10-14: qty 2

## 2023-10-14 MED ORDER — ONDANSETRON HCL 4 MG PO TABS: 4.0000 mg | ORAL_TABLET | Freq: Four times a day (QID) | ORAL | Status: DC | PRN

## 2023-10-14 NOTE — Assessment & Plan Note (Signed)
 Likely from exogenous losses from vomiting  Check magnesium  Replete and trend

## 2023-10-14 NOTE — ED Notes (Signed)
 Patient transported to CT

## 2023-10-14 NOTE — Assessment & Plan Note (Signed)
 Likely inflammatory Check UA, CXR with no infectious findings  IVF and trend  Follow fever curve

## 2023-10-14 NOTE — ED Notes (Signed)
 Pt vomited a moderate amount of dark brown emesis- sent to lab

## 2023-10-14 NOTE — H&P (Signed)
 History and Physical    Patient: David Mcgrath FMW:[id number] DOB: 07-21-1950 DOA: 10/14/2023 DOS: the patient was seen and examined on 10/14/2023 PCP: [name], DO  Patient coming from: Home - lives with his wife. Ambulates with walker.    Chief Complaint: vomiting   HPI: David Mcgrath is a 73 y.o. male with medical history significant of depression, HTN, HLD, PAF, neuropathy and recent diagnosis in 06/2023 of Multiple Myeloma on Velcade  and Revlimid . Last treatment on 10/12/2023, cycle 3 who presented to ED with coffee ground emesis and continued intractable vomiting. He had his chemo on Thursday and didn't feel great. He also started back  his revlimid  at 10mg , dose reduced due to side effects of diarrhea. Friday afternoon he was tired and then around 10:30 last night he started to have vomiting, about every 3 hours. It has been described as coffee ground emesis the entire time. He denies any black/tarry stool or blood in stool. He has had some generalized abdominal discomfort., but denies any pain now. He denies any surgeries, specifically to his abdomen. Last time he had a BM was yesterday afternoon. He is still passing gas. He has had a colonoscopy in 03/2023. It was normal per patient. He follows with [facility].    Denies any fever/chills, vision changes/headaches, chest pain or palpitations, shortness of breath or cough, abdominal pain, N/V/D, dysuria or leg swelling.    He does not smoke or drink alcohol .   ER Course:  vitals: afebrile, bp: 105/53, HR: 78, RR: 16, oxygen: 97%RA Pertinent labs: wbc: 16.2, hgb: 10.8, gastric occult positive, potassium: 3.3,  CXR: Low lung volume radiograph with bibasilar atelectasis. 2. Rounded opacity at the LEFT lateral approximate seventh rib, likely a lytic lesion. Possible pathologic fracture of the RIGHT posterior sixth rib, similar compared to prior. 3. If clinical concern for intraperitoneal free air, recommend dedicated  cross-sectional imaging. CTA abdomen/pelvis: No contrast accumulation in the GI tract to localize site of GI bleed. 2. Progressive small bowel distension with suspected transition point in the right hemiabdomen, suspicious for small bowel obstruction. 3. Question of internal hernia with transverse colon coursing posterior to the stomach, but no inflammation or obstructive change at this site. 4. Small volume abdominopelvic ascites, new from prior exam. Generalized body wall edema/anasarca. 5. Dependent ground-glass opacities in the lung bases may represent hypoventilatory change or infection. Aspiration is also considered. 6. Known lytic lesions, without significant change. In ED: general surgery consulted. Given reglan , protonix , zofran , 1L IVF bolus. TRH asked to admit.    Review of Systems: As mentioned in the history of present illness. All other systems reviewed and are negative. 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  Patient reports he does not have symptoms as of 02/25/14.     Diarrhea 06/04/2023   Essential hypertension 06/04/2023   Fatty liver 06/04/2023   Flu 06/04/2023   Hyperlipidemia 06/04/2023   Ileus (HCC) 06/04/2023   Neuropathy    Paroxysmal atrial fibrillation (HCC) 07/04/2023   Pneumonia 06/04/2023   Past Surgical History:  Procedure Laterality Date   IR BONE MARROW BIOPSY & ASPIRATION  07/31/2023   NO PAST SURGERIES     Social History:  reports that he has never smoked. He has never used smokeless tobacco.  He reports current alcohol  use. He reports that he does not use drugs.  Allergies  Allergen Reactions   Shrimp [Shellfish Allergy] Anaphylaxis and Swelling    Swelling throat     Family History  Problem Relation Age of Onset   Diabetes  Mother    Cancer Mother    Heart disease Mother    Cancer Father    Hyperlipidemia Father    Neuropathy Paternal Uncle    Migraines Neg Hx     Prior to Admission medications   Medication Sig Start Date End Date Taking? Authorizing Provider  acyclovir  (ZOVIRAX ) 400 MG tablet Take 1 tablet (400 mg total) by mouth 2 (two) times daily. 08/08/23   Federico Norleen ONEIDA MADISON, MD  allopurinol  (ZYLOPRIM ) 300 MG tablet Take 300 mg by mouth daily.    [provider]  apixaban  (ELIQUIS ) 5 MG TABS tablet Take 1 tablet (5 mg total) by mouth 2 (two) times daily. 07/04/23   Revankar, Jennifer SAUNDERS, MD  calcium -vitamin D (OSCAL WITH D) 500-5 MG-MCG tablet Take 1 tablet by mouth 2 (two) times daily. 09/21/23   Federico Norleen ONEIDA MADISON, MD  Cholecalciferol (VITAMIN D-3) 125 MCG (5000 UT) TABS Take 5,000 Units by mouth daily.    [provider]  Cyanocobalamin  (VITAMIN B12) 1000 MCG TBCR Take 1,000 mcg by mouth daily.    [provider]  dexamethasone  (DECADRON ) 4 MG tablet Take 40 mg by mouth once a week on day of Velcade  injection. (10 tablets) 09/07/23   Dorsey, Kane T IV, MD  diphenoxylate -atropine  (LOMOTIL ) 2.5-0.025 MG tablet Take 1 tablet by mouth 4 (four) times daily as needed for diarrhea or loose stools. 08/23/23   Thayil, Irene T, PA-C  ezetimibe (ZETIA) 10 MG tablet Take 10 mg by mouth every evening. 06/01/19   [provider]  FLUoxetine  (PROZAC ) 20 MG capsule Take 20 mg by mouth daily.    [provider]  fluticasone (FLONASE) 50 MCG/ACT nasal spray Place 1 spray into both nostrils daily.    [provider]  gabapentin  (NEURONTIN ) 300 MG capsule Take 2 capsules (600 mg total) by mouth 2 (two) times daily. Patient taking differently: Take 600 mg by mouth 3 (three) times daily. 06/15/23   Briana Elgin LABOR, MD  guaiFENesin  (MUCINEX ) 600 MG 12 hr tablet Take 600 mg by mouth daily.    [provider]  guaifenesin  (ROBITUSSIN) 100 MG/5ML syrup Take 200 mg by mouth daily as  needed for cough.    [provider]  lenalidomide  (REVLIMID ) 10 MG capsule Take 1 capsule (10 mg total) by mouth daily. Bertrum Barrows # [id number]     Date Obtained 10/05/23 Take one daily for 21 days then none for 7 days. 10/05/23   Dorsey, Ulrick T IV, MD  melatonin 5 MG TABS Take 5 mg by mouth at bedtime.    [provider]  omeprazole (PRILOSEC) 40 MG capsule Take 40 mg by mouth daily.    [provider]  ondansetron  (ZOFRAN ) 8 MG tablet Take 1 tablet (8 mg total) by mouth every 8 (eight) hours as needed for nausea or vomiting. 08/08/23   Federico Norleen ONEIDA MADISON, MD  potassium chloride  SA (KLOR-CON  M) 20 MEQ tablet Take 1 tablet (20 mEq total) by mouth 2 (two) times daily. 09/28/23   Dorsey, Leroy T IV, MD  pravastatin (PRAVACHOL) 80 MG tablet Take 80 mg by mouth at bedtime.    [provider]  prochlorperazine  (COMPAZINE ) 10 MG tablet Take 1  tablet (10 mg total) by mouth every 6 (six) hours as needed for nausea or vomiting. 08/08/23   Federico Norleen ONEIDA MADISON, MD  pyridoxine (B-6) 100 MG tablet Take 100 mg by mouth daily.    [provider]    Physical Exam: Vitals:   10/14/23 1343 10/14/23 1400 10/14/23 1515 10/14/23 1730  BP:  (!) 139/56  124/78  Pulse: (!) 47 (!) 47 (!) 58 (!) 116  Resp: (!) 28 (!) 26 (!) 26 16  Temp:    98.9 F (37.2 C)  TempSrc:    Oral  SpO2: 98% 98% 94% 97%  Weight:      Height:       General:  Appears calm and comfortable and is in NAD Eyes:  PERRL, EOMI, normal lids, iris ENT:  grossly normal hearing, lips & tongue, mmm; appropriate dentition Neck:  no LAD, masses or thyromegaly; no carotid bruits Cardiovascular:  irregularly irregular, no m/r/g. No LE edema.  Respiratory:   CTA bilaterally with no wheezes/rales/rhonchi.  Normal respiratory effort. Abdomen:  soft, NT, ND, NABS. No rebound or guarding  Back:   normal alignment, no CVAT Skin:  no rash or induration seen on limited exam Musculoskeletal:  grossly normal tone BUE/BLE, good  ROM, no bony abnormality Lower extremity:  No LE edema.  Limited foot exam with no ulcerations.  2+ distal pulses. Psychiatric:  grossly normal mood and affect, speech fluent and appropriate, AOx3 Neurologic:  CN 2-12 grossly intact, moves all extremities in coordinated fashion, sensation intact   Radiological Exams on Admission: Independently reviewed - see discussion in A/P where applicable  CT Angio Abd/Pel W and/or Wo Contrast Result Date: 10/14/2023 CLINICAL DATA:  Lower GI bleed. Coffee ground emesis. History of multiple myeloma. EXAM: CTA ABDOMEN AND PELVIS WITHOUT AND WITH CONTRAST TECHNIQUE: Multidetector CT imaging of the abdomen and pelvis was performed using the standard protocol during bolus administration of intravenous contrast. Multiplanar reconstructed images and MIPs were obtained and reviewed to evaluate the vascular anatomy. RADIATION DOSE REDUCTION: This exam was performed according to the departmental dose-optimization program which includes automated exposure control, adjustment of the mA and/or kV according to patient size and/or use of iterative reconstruction technique. CONTRAST:  OMNIPAQUE  IOHEXOL  350 MG/ML SOLN COMPARISON:  CT 06/08/2023 FINDINGS: VASCULAR Aorta: Normal caliber aorta without aneurysm, dissection, vasculitis or significant stenosis. Moderate aortic atherosclerosis. Celiac: Dense plaque at the origin causes high-grade stenosis with mild poststenotic dilatation. No dissection or aneurysm. SMA: Patent without evidence of aneurysm, dissection, vasculitis or significant stenosis. Renals: Mild plaque at the origin of both renal arteries without significant stenosis. Both renal arteries are patent without evidence of aneurysm, dissection, vasculitis, or fibromuscular dysplasia. IMA: Patent without evidence of aneurysm, dissection, vasculitis or significant stenosis. Inflow: Coarse plaque at the origin of the common iliac arteries causes less than 50% stenosis.  Distal branches are patent. Proximal Outflow: Plaque in the left femoral artery without severe stenosis. No dissection or acute findings. Veins: Venous phase imaging demonstrates no acute findings. Patent portal and splenic veins. Patent mesenteric veins. No portal venous or mesenteric gas. Review of the MIP images confirms the above findings. NON-VASCULAR Lower chest: Minimal left pleural thickening is unchanged from prior exam. Dependent ground-glass opacities may represent hypoventilatory change or infection. Hepatobiliary: No focal liver abnormality is seen. No gallstones, gallbladder wall thickening, or biliary dilatation. Pancreas: No ductal dilatation or inflammation. Spleen: Normal in size without focal abnormality. Adrenals/Urinary Tract: Normal adrenal glands. No hydronephrosis or renal calculi.  No suspicious renal abnormality. The urinary bladder is partially distended, mildly thick walled. Stomach/Bowel: No contrast accumulation in the GI tract to localize site of GI bleed. Progressive small bowel distension with suspected transition point in the right hemiabdomen, series 8, image 97. There is adjacent mesenteric edema. Small bowel distal to this is decompressed. Small bowel proximally is dilated and fluid-filled. The stomach is dilated and fluid-filled. Fluid distention of the included distal esophagus. The transverse colon is tortuous, courses posterior to the stomach. Question of underlying mesenteric defect in internal hernia, but no wall thickening or obstruction at this point. Mild left colonic diverticulosis without diverticulitis. The appendix is not confidently visualized. Lymphatic: No enlarged lymph nodes in the abdomen or pelvis. Reproductive: Prostate is unremarkable. Other: Small volume abdominopelvic ascites, new from prior exam. Generalized body wall edema. There is fluid within both inguinal canals. No free air or focal fluid collection. Small left lumbar hernia contains only fat.  Musculoskeletal: Scattered lytic lesions are again seen, not significantly changed. No evidence of pathologic fracture or compression deformity. Multilevel degenerative change in the spine. IMPRESSION: 1. No contrast accumulation in the GI tract to localize site of GI bleed. 2. Progressive small bowel distension with suspected transition point in the right hemiabdomen, suspicious for small bowel obstruction. 3. Question of internal hernia with transverse colon coursing posterior to the stomach, but no inflammation or obstructive change at this site. 4. Small volume abdominopelvic ascites, new from prior exam. Generalized body wall edema/anasarca. 5. Dependent ground-glass opacities in the lung bases may represent hypoventilatory change or infection. Aspiration is also considered. 6. Known lytic lesions, without significant change. Aortic Atherosclerosis (ICD10-I70.0). Electronically Signed   By: Andrea Gasman M.D.   On: 10/14/2023 15:58   DG Chest Portable 1 View Result Date: 10/14/2023 CLINICAL DATA:  repeated forceful vomiting EXAM: PORTABLE CHEST 1 VIEW COMPARISON:  June 04, 2023, June 08, 2023 FINDINGS: The cardiomediastinal silhouette is unchanged in contour. No pleural effusion. No pneumothorax. Patchy bibasilar platelike opacities, favored to reflect atelectasis. Low lung volume radiograph. Rounded opacity at the LEFT lateral approximate seventh rib, likely a lytic lesion this level. Gaseous distension of bowel beneath the diaphragm. Possible pathologic fracture of the RIGHT posterior sixth rib, similar compared to prior IMPRESSION: 1. Low lung volume radiograph with bibasilar atelectasis. 2. Rounded opacity at the LEFT lateral approximate seventh rib, likely a lytic lesion. Possible pathologic fracture of the RIGHT posterior sixth rib, similar compared to prior. 3. If clinical concern for intraperitoneal free air, recommend dedicated cross-sectional imaging. Electronically Signed   By: Corean Salter M.D.   On: 10/14/2023 14:18    EKG: Independently reviewed.  Atrial fibrillation with rate 92; nonspecific ST changes with no evidence of acute ischemia   Labs on Admission: I have personally reviewed the available labs and imaging studies at the time of the admission.  Pertinent labs:   wbc: 16.2,  hgb: 10.8,  gastric occult positive,  potassium: 3.3  Assessment and Plan: Principal Problem:   Small bowel obstruction with coffee ground emesis Active Problems:   Leukocytosis   Hypokalemia   Paroxysmal atrial fibrillation (HCC)   Multiple myeloma (HCC)   Neuropathy   Essential hypertension   Depression   Hyperlipidemia   OSA (obstructive sleep apnea)    Assessment and Plan: * Small bowel obstruction with coffee ground emesis 73 year old male presenting to ED with intractable vomiting described as coffee ground emesis with some non specific generalized abdominal pain found to have  SBO -Admit to progressive  -NPO  -general surgery consulted -NG tube  -Gentle IV fluid hydration -encourage ambulation -Monitor electrolytes -anti emetics  -gastric +occult. CTA abdomen negative for active bleed. BUN wnl. Discussed with GI on call. Hgb stable, will still give protonix  IV BID, trend CBC and monitor what comes out of NG tube when placed. Hold eliquis . Advised to consult if change in clinical picture    Leukocytosis Likely inflammatory Check UA, CXR with no infectious findings  IVF and trend  Follow fever curve   Hypokalemia Likely from exogenous losses from vomiting  Check magnesium  Replete and trend   Paroxysmal atrial fibrillation (HCC) Diagnosed in April of 2025 Follows with Dr. Ladona Atrial fib. On no rate controlling drugs Hold eliquis   If hgb stable and no concern UGIB consider heparin  if continues to be NPO Was on digoxin  and taken off in June since in NSR Metoprolol  q5 min PRN x 2 for rate   Multiple myeloma (HCC) IgG Kappa MM, diagnosed in march of  2025 Followed by Dr.Dorsey  On Velcade  plus Dexamethasone  and Revlimid , last infusion 10/12/2023. Day 15, cycle 3  weekly treatments, dropped Velcade  to 1 mg/m due to diarrhea  From office note on 7/3: revlimid  25 mg PO on pause x 2 weeks.  Will restart at Revlimid  10 mg p.o. daily 21 days on and 7 days off. He started this on Thursday night (AE of diarrhea)   Neuropathy -EMG nerve conduction study done on 01/30/2020 by Dr. Margaret confirms severe axonal sensorimotor polyneuropathy as well as moderate right carpal tunnel.  --Currently on gabapentin  600 mg TID.   Essential hypertension Maintained normotensive readings since significant weight loss Continue to monitor   Depression Continue prozac  daily   Hyperlipidemia Continue pravachol and zetia   OSA (obstructive sleep apnea) Oxygen at night until NG tube removed and then cpap     Advance Care Planning:   Code Status: Full Code   Consults: general surgery   DVT Prophylaxis: SCDs   Family Communication: wife at bedside   Severity of Illness: The appropriate patient status for this patient is INPATIENT. Inpatient status is judged to be reasonable and necessary in order to provide the required intensity of service to ensure the patient's safety. The patient's presenting symptoms, physical exam findings, and initial radiographic and laboratory data in the context of their chronic comorbidities is felt to place them at high risk for further clinical deterioration. Furthermore, it is not anticipated that the patient will be medically stable for discharge from the hospital within 2 midnights of admission.   * I certify that at the point of admission it is my clinical judgment that the patient will require inpatient hospital care spanning beyond 2 midnights from the point of admission due to high intensity of service, high risk for further deterioration and high frequency of surveillance required.*  Author: Isaiah Geralds,  MD 10/14/2023 5:52 PM  For on call review [email or web address].

## 2023-10-14 NOTE — Assessment & Plan Note (Signed)
Continue pravachol and zetia

## 2023-10-14 NOTE — Consult Note (Signed)
 Reason for Consult:possible SBO Referring Physician: Dr. RONAL Waddell David Mcgrath is an 73 y.o. male.  HPI: This is a 73 year old gentleman with a history of multiple myeloma oral chemotherapy presents with nausea and vomiting.  He just had treatment last Thursday and then last evening started developing nausea and vomiting.  He was having coffee-ground emesis.  He had also been having some diarrhea.  He presented to the hospital and surgery has been asked to the patient regarding a possible small bowel obstruction.  He has had no previous history of abdominal surgery and currently has no abdominal pain.  He is passing flatus.  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  Patient reports he does not have symptoms as of 02/25/14.     Diarrhea 06/04/2023   Essential hypertension 06/04/2023   Fatty liver 06/04/2023   Flu 06/04/2023   Hyperlipidemia 06/04/2023   Ileus (HCC) 06/04/2023   Neuropathy    Paroxysmal atrial fibrillation (HCC) 07/04/2023   Pneumonia 06/04/2023    Past Surgical History:  Procedure Laterality Date   IR BONE MARROW BIOPSY & ASPIRATION  07/31/2023   NO PAST SURGERIES      Family History  Problem Relation Age of Onset   Diabetes Mother    Cancer Mother    Heart disease Mother    Cancer Father    Hyperlipidemia Father    Neuropathy Paternal Uncle    Migraines Neg Hx     Social History:  reports that he has never smoked. He has never used smokeless tobacco. He reports current alcohol  use. He reports that he does not use drugs.  Allergies:  Allergies  Allergen Reactions   Shrimp [Shellfish Allergy] Anaphylaxis and Swelling    Swelling throat     Medications: I have reviewed the patient's current  medications.  Results for orders placed or performed during the hospital encounter of 10/14/23 (from the past 48 hours)  Occult bld gastric/duodenum (cup to lab)     Status: Abnormal   Collection Time: 10/14/23  1:11 PM  Result Value Ref Range   pH, Gastric 3    Occult Blood, Gastric POSITIVE (A) NEGATIVE    Comment: Performed at [facility], [address], [place], [place] [address]  CBC with Differential     Status: Abnormal   Collection Time: 10/14/23  2:06 PM  Result Value Ref Range   WBC 16.2 (H) 4.0 - 10.5 K/uL   RBC 3.47 (L) 4.22 - 5.81 MIL/uL   Hemoglobin 10.8 (L) 13.0 - 17.0 g/dL   HCT 66.3 (L) 60.9 - 47.9 %   MCV 96.8 80.0 - 100.0 fL   MCH 31.1 26.0 - 34.0 pg   MCHC 32.1 30.0 - 36.0 g/dL   RDW 83.3 (H) 88.4 - 84.4 %   Platelets 260 150 - 400 K/uL   nRBC 0.0 0.0 - 0.2 %   Neutrophils Relative % 89 %   Neutro Abs 14.3 (H) 1.7 - 7.7 K/uL   Lymphocytes Relative 3 %   Lymphs Abs 0.4 (L) 0.7 - 4.0 K/uL   Monocytes Relative 8 %   Monocytes Absolute 1.4 (H) 0.1 - 1.0 K/uL   Eosinophils Relative 0 %   Eosinophils  Absolute 0.0 0.0 - 0.5 K/uL   Basophils Relative 0 %   Basophils Absolute 0.0 0.0 - 0.1 K/uL   Immature Granulocytes 0 %   Abs Immature Granulocytes 0.07 0.00 - 0.07 K/uL    Comment: Performed at [facility], [address], [place], [place] [address]  Comprehensive metabolic panel     Status: Abnormal   Collection Time: 10/14/23  2:06 PM  Result Value Ref Range   Sodium 137 135 - 145 mmol/L   Potassium 3.3 (L) 3.5 - 5.1 mmol/L   Chloride 96 (L) 98 - 111 mmol/L   CO2 30 22 - 32 mmol/L   Glucose, Bld 106 (H) 70 - 99 mg/dL    Comment: Glucose reference range applies only to samples taken after fasting for at least 8 hours.   BUN 14 8 - 23 mg/dL   Creatinine, Ser 9.27 0.61 - 1.24 mg/dL   Calcium  8.6 (L) 8.9 - 10.3 mg/dL   Total Protein 5.4 (L) 6.5 - 8.1 g/dL   Albumin 2.7 (L) 3.5 - 5.0 g/dL   AST 33 15 - 41 U/L   ALT 33  0 - 44 U/L   Alkaline Phosphatase 45 38 - 126 U/L   Total Bilirubin 0.4 0.0 - 1.2 mg/dL   GFR, Estimated >39 >39 mL/min    Comment: (NOTE) Calculated using the CKD-EPI Creatinine Equation (2021)    Anion gap 11 5 - 15    Comment: Performed at [facility], [address], [place], [place] [address]  Protime-INR     Status: Abnormal   Collection Time: 10/14/23  2:06 PM  Result Value Ref Range   Prothrombin Time 17.5 (H) 11.4 - 15.2 seconds   INR 1.4 (H) 0.8 - 1.2    Comment: (NOTE) INR goal varies based on device and disease states. Performed at [facility], [address] [place], [place] [address]     CT Angio Abd/Pel W and/or Wo Contrast Result Date: 10/14/2023 CLINICAL DATA:  Lower GI bleed. Coffee ground emesis. History of multiple myeloma. EXAM: CTA ABDOMEN AND PELVIS WITHOUT AND WITH CONTRAST TECHNIQUE: Multidetector CT imaging of the abdomen and pelvis was performed using the standard protocol during bolus administration of intravenous contrast. Multiplanar reconstructed images and MIPs were obtained and reviewed to evaluate the vascular anatomy. RADIATION DOSE REDUCTION: This exam was performed according to the departmental dose-optimization program which includes automated exposure control, adjustment of the mA and/or kV according to patient size and/or use of iterative reconstruction technique. CONTRAST:  OMNIPAQUE  IOHEXOL  350 MG/ML SOLN COMPARISON:  CT 06/08/2023 FINDINGS: VASCULAR Aorta: Normal caliber aorta without aneurysm, dissection, vasculitis or significant stenosis. Moderate aortic atherosclerosis. Celiac: Dense plaque at the origin causes high-grade stenosis with mild poststenotic dilatation. No dissection or aneurysm. SMA: Patent without evidence of aneurysm, dissection, vasculitis or significant stenosis. Renals: Mild plaque at the origin of both renal arteries without significant stenosis. Both renal arteries are patent  without evidence of aneurysm, dissection, vasculitis, or fibromuscular dysplasia. IMA: Patent without evidence of aneurysm, dissection, vasculitis or significant stenosis. Inflow: Coarse plaque at the origin of the common iliac arteries causes less than 50% stenosis. Distal branches are patent. Proximal Outflow: Plaque in the left femoral artery without severe stenosis. No dissection or acute findings. Veins: Venous phase imaging demonstrates no acute findings. Patent portal and splenic veins. Patent mesenteric veins. No portal venous or mesenteric gas. Review of the MIP images confirms the above findings. NON-VASCULAR Lower  chest: Minimal left pleural thickening is unchanged from prior exam. Dependent ground-glass opacities may represent hypoventilatory change or infection. Hepatobiliary: No focal liver abnormality is seen. No gallstones, gallbladder wall thickening, or biliary dilatation. Pancreas: No ductal dilatation or inflammation. Spleen: Normal in size without focal abnormality. Adrenals/Urinary Tract: Normal adrenal glands. No hydronephrosis or renal calculi. No suspicious renal abnormality. The urinary bladder is partially distended, mildly thick walled. Stomach/Bowel: No contrast accumulation in the GI tract to localize site of GI bleed. Progressive small bowel distension with suspected transition point in the right hemiabdomen, series 8, image 97. There is adjacent mesenteric edema. Small bowel distal to this is decompressed. Small bowel proximally is dilated and fluid-filled. The stomach is dilated and fluid-filled. Fluid distention of the included distal esophagus. The transverse colon is tortuous, courses posterior to the stomach. Question of underlying mesenteric defect in internal hernia, but no wall thickening or obstruction at this point. Mild left colonic diverticulosis without diverticulitis. The appendix is not confidently visualized. Lymphatic: No enlarged lymph nodes in the abdomen or pelvis.  Reproductive: Prostate is unremarkable. Other: Small volume abdominopelvic ascites, new from prior exam. Generalized body wall edema. There is fluid within both inguinal canals. No free air or focal fluid collection. Small left lumbar hernia contains only fat. Musculoskeletal: Scattered lytic lesions are again seen, not significantly changed. No evidence of pathologic fracture or compression deformity. Multilevel degenerative change in the spine. IMPRESSION: 1. No contrast accumulation in the GI tract to localize site of GI bleed. 2. Progressive small bowel distension with suspected transition point in the right hemiabdomen, suspicious for small bowel obstruction. 3. Question of internal hernia with transverse colon coursing posterior to the stomach, but no inflammation or obstructive change at this site. 4. Small volume abdominopelvic ascites, new from prior exam. Generalized body wall edema/anasarca. 5. Dependent ground-glass opacities in the lung bases may represent hypoventilatory change or infection. Aspiration is also considered. 6. Known lytic lesions, without significant change. Aortic Atherosclerosis (ICD10-I70.0). Electronically Signed   By: Andrea Gasman M.D.   On: 10/14/2023 15:58   DG Chest Portable 1 View Result Date: 10/14/2023 CLINICAL DATA:  repeated forceful vomiting EXAM: PORTABLE CHEST 1 VIEW COMPARISON:  June 04, 2023, June 08, 2023 FINDINGS: The cardiomediastinal silhouette is unchanged in contour. No pleural effusion. No pneumothorax. Patchy bibasilar platelike opacities, favored to reflect atelectasis. Low lung volume radiograph. Rounded opacity at the LEFT lateral approximate seventh rib, likely a lytic lesion this level. Gaseous distension of bowel beneath the diaphragm. Possible pathologic fracture of the RIGHT posterior sixth rib, similar compared to prior IMPRESSION: 1. Low lung volume radiograph with bibasilar atelectasis. 2. Rounded opacity at the LEFT lateral approximate seventh  rib, likely a lytic lesion. Possible pathologic fracture of the RIGHT posterior sixth rib, similar compared to prior. 3. If clinical concern for intraperitoneal free air, recommend dedicated cross-sectional imaging. Electronically Signed   By: Corean Salter M.D.   On: 10/14/2023 14:18    Review of Systems  Constitutional:  Negative for chills and fever.  Gastrointestinal:  Negative for abdominal pain and blood in stool.  Genitourinary:  Negative for dysuria.  All other systems reviewed and are negative.  Blood pressure 124/78, pulse (!) 116, temperature 98.9 F (37.2 C), temperature source Oral, resp. rate 16, height 6' 4 (1.93 m), weight 102 kg, SpO2 97%. Physical Exam Constitutional:      Appearance: Normal appearance. He is not toxic-appearing.  HENT:     Head: Normocephalic and atraumatic.  Right Ear: External ear normal.     Left Ear: External ear normal.     Nose: Nose normal.  Eyes:     General: No scleral icterus. Cardiovascular:     Rate and Rhythm: Normal rate. Rhythm irregular.  Pulmonary:     Effort: Pulmonary effort is normal. No respiratory distress.  Abdominal:     General: There is no distension.     Palpations: Abdomen is soft.     Tenderness: There is no abdominal tenderness.  Skin:    General: Skin is warm and dry.     Coloration: Skin is not jaundiced.  Neurological:     General: No focal deficit present.     Mental Status: He is alert.  Psychiatric:        Behavior: Behavior normal.        Judgment: Judgment normal.     Assessment/Plan: Ileus versus small bowel obstruction  I have reviewed the patient's history as well as reviewed his CT scan of the abdomen and pelvis.  I suspect this is an ileus secondary to his medications.  He has no prior surgical history small bowel obstruction less likely.  He currently has a nasogastric tube in place.  The contents in the nasogastric tube appear slightly coffee-ground.  This persists, he may need GI to  evaluate him for an endoscopy.  His Eliquis  is currently on hold.  For now, we will hold on ordering the small bowel protocol to allow further decompression of the GI tract with the NG tube.  His abdominal exam is benign and he does not urgent surgery.  I explained this to him in detail and he understands and agrees with plans.  Moderately complex medical decision making  David Mcgrath 10/14/2023, 5:57 PM

## 2023-10-14 NOTE — ED Triage Notes (Signed)
 Arrived with wife, Cancer patient receiving oral treatments with recent adjustment to dose. States last night started vomiting dark, almost black colored. Reports overall feeling fatigue. Denies noting any obvious blood in stool or in urine. No other issues reported

## 2023-10-14 NOTE — Assessment & Plan Note (Signed)
Continue prozac daily  ?

## 2023-10-14 NOTE — Assessment & Plan Note (Signed)
 Oxygen at night until NG tube removed and then cpap

## 2023-10-14 NOTE — Assessment & Plan Note (Signed)
 73 year old male presenting to ED with intractable vomiting described as coffee ground emesis with some non specific generalized abdominal pain found to have SBO -Admit to progressive  -NPO  -general surgery consulted -NG tube  -Gentle IV fluid hydration -encourage ambulation -Monitor electrolytes -anti emetics  -gastric +occult. CTA abdomen negative for active bleed. BUN wnl. Discussed with GI on call. Hgb stable, will still give protonix  IV BID, trend CBC and monitor what comes out of NG tube when placed. Hold eliquis . Advised to consult if change in clinical picture

## 2023-10-14 NOTE — Assessment & Plan Note (Signed)
 Maintained normotensive readings since significant weight loss Continue to monitor

## 2023-10-14 NOTE — Assessment & Plan Note (Addendum)
 Diagnosed in April of 2025 Follows with Dr. Ladona Atrial fib. On no rate controlling drugs Hold eliquis   If hgb stable and no concern UGIB consider heparin  if continues to be NPO Was on digoxin  and taken off in June since in NSR Metoprolol  q5 min PRN x 2 for rate

## 2023-10-14 NOTE — ED Provider Notes (Signed)
 [facility]PARTMENT AT [facility] Provider Note   CSN: [id number] Arrival date & time: 10/14/23  1216     History  Chief Complaint  Patient presents with   Emesis    David Mcgrath is a 73 y.o. male with PMH as listed below who presents with nausea/vomiting. Patient undergoing chemotherapy for multiple myeloma. Had doses recently decreased d/t significant diarrhea, then doses were increased again once diarrhea improved. Significant nausea/vomiting that is a new symptom started lats night at 10:30 and it was black in color. Reports overall feeling fatigue. Denies noting any obvious blood in stool or in urine. Took zofran  at night but it didn't help. Doesn't take ibuprofen/aspirin/NSAIDs. Drinks alcohol  socially. No tobacco use. Takes eliquis .    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  Patient reports he does not have symptoms as of 02/25/14.     Diarrhea 06/04/2023   Essential hypertension 06/04/2023   Fatty liver 06/04/2023   Flu 06/04/2023   Hyperlipidemia 06/04/2023   Ileus (HCC) 06/04/2023   Neuropathy    Paroxysmal atrial fibrillation (HCC) 07/04/2023   Pneumonia 06/04/2023       Home Medications Prior to Admission medications   Medication Sig Start Date End Date Taking? Authorizing Provider  acyclovir  (ZOVIRAX ) 400 MG tablet Take 1 tablet (400 mg total) by mouth 2 (two) times daily. 08/08/23   Federico Norleen ONEIDA MADISON, MD  allopurinol  (ZYLOPRIM ) 300 MG tablet Take 300 mg by mouth daily.    [provider]  apixaban  (ELIQUIS ) 5 MG TABS tablet Take 1 tablet (5 mg total) by mouth 2 (two) times daily. 07/04/23   Revankar, Jennifer SAUNDERS, MD  calcium -vitamin D (OSCAL WITH D) 500-5 MG-MCG tablet Take 1 tablet by mouth 2 (two) times  daily. 09/21/23   Federico Norleen ONEIDA MADISON, MD  Cholecalciferol (VITAMIN D-3) 125 MCG (5000 UT) TABS Take 5,000 Units by mouth daily.    [provider]  Cyanocobalamin  (VITAMIN B12) 1000 MCG TBCR Take 1,000 mcg by mouth daily.    [provider]  dexamethasone  (DECADRON ) 4 MG tablet Take 40 mg by mouth once a week on day of Velcade  injection. (10 tablets) 09/07/23   Dorsey, Langston T IV, MD  diphenoxylate -atropine  (LOMOTIL ) 2.5-0.025 MG tablet Take 1 tablet by mouth 4 (four) times daily as needed for diarrhea or loose stools. 08/23/23   Thayil, Irene T, PA-C  ezetimibe (ZETIA) 10 MG tablet Take 10 mg by mouth every evening. 06/01/19   [provider]  FLUoxetine  (PROZAC ) 20 MG capsule Take 20 mg by mouth daily.    [provider]  fluticasone (FLONASE) 50 MCG/ACT nasal spray Place 1 spray into both nostrils daily.    [provider]  gabapentin  (NEURONTIN ) 300 MG capsule Take 2 capsules (600 mg total) by mouth 2 (two) times daily. Patient taking differently: Take 600 mg by mouth 3 (three) times daily. 06/15/23   Briana Elgin LABOR, MD  guaiFENesin  (MUCINEX ) 600 MG 12 hr tablet Take 600 mg by mouth daily.    [provider]  guaifenesin  (ROBITUSSIN) 100 MG/5ML syrup Take 200 mg by mouth daily as needed for cough.    [provider]  lenalidomide  (REVLIMID ) 10 MG capsule Take 1 capsule (10 mg total) by mouth  daily. [employer] Auth # [id number]     Date Obtained 10/05/23 Take one daily for 21 days then none for 7 days. 10/05/23   Dorsey, Amado T IV, MD  melatonin 5 MG TABS Take 5 mg by mouth at bedtime.    [provider]  omeprazole (PRILOSEC) 40 MG capsule Take 40 mg by mouth daily.    [provider]  ondansetron  (ZOFRAN ) 8 MG tablet Take 1 tablet (8 mg total) by mouth every 8 (eight) hours as needed for nausea or vomiting. 08/08/23   Federico Norleen ONEIDA MADISON, MD  potassium chloride  SA (KLOR-CON  M) 20 MEQ tablet Take 1 tablet (20 mEq total) by mouth 2 (two)  times daily. 09/28/23   Dorsey, Trafton T IV, MD  pravastatin (PRAVACHOL) 80 MG tablet Take 80 mg by mouth at bedtime.    [provider]  prochlorperazine  (COMPAZINE ) 10 MG tablet Take 1 tablet (10 mg total) by mouth every 6 (six) hours as needed for nausea or vomiting. 08/08/23   Federico Norleen ONEIDA MADISON, MD  pyridoxine (B-6) 100 MG tablet Take 100 mg by mouth daily.    [provider]      Allergies    Shrimp [shellfish allergy]    Review of Systems   Review of Systems A 10 point review of systems was performed and is negative unless otherwise reported in HPI.  Physical Exam Updated Vital Signs BP (!) 139/56   Pulse (!) 58   Temp (!) 97.5 F (36.4 C) (Oral)   Resp (!) 26   Ht 6' 4 (1.93 m)   Wt 102 kg   SpO2 94%   BMI 27.37 kg/m  Physical Exam General: Normal appearing male, lying in bed.  HEENT: PERRLA, Sclera anicteric, MMM, trachea midline.  Cardiology: RRR, no murmurs/rubs/gallops.   Resp: Normal respiratory rate and effort. CTAB, no wheezes, rhonchi, crackles.  Abd: Soft, mildly distended mildly diffusely tender abdomen, Rebound tenderness or guarding.  GU: Deferred. MSK: No peripheral edema or signs of trauma. Extremities without deformity or TTP. No cyanosis or clubbing. Skin: warm, dry.  Neuro: A&Ox4, CNs II-XII grossly intact. MAEs. Sensation grossly intact.  Psych: Normal mood and affect.   ED Results / Procedures / Treatments   Labs (all labs ordered are listed, but only abnormal results are displayed) Labs Reviewed  OCCULT BLOOD GASTRIC / DUODENUM (SPECIMEN CUP) - Abnormal; Notable for the following components:      Result Value   Occult Blood, Gastric POSITIVE (*)    All other components within normal limits  CBC WITH DIFFERENTIAL/PLATELET - Abnormal; Notable for the following components:   WBC 16.2 (*)    RBC 3.47 (*)    Hemoglobin 10.8 (*)    HCT 33.6 (*)    RDW 16.6 (*)    Neutro Abs 14.3 (*)    Lymphs Abs 0.4 (*)    Monocytes Absolute 1.4  (*)    All other components within normal limits  COMPREHENSIVE METABOLIC PANEL WITH GFR - Abnormal; Notable for the following components:   Potassium 3.3 (*)    Chloride 96 (*)    Glucose, Bld 106 (*)    Calcium  8.6 (*)    Total Protein 5.4 (*)    Albumin 2.7 (*)    All other components within normal limits  PROTIME-INR - Abnormal; Notable for the following components:   Prothrombin Time 17.5 (*)    INR 1.4 (*)    All other components within normal limits  CBC  CBC  CBC  URINALYSIS, ROUTINE W REFLEX MICROSCOPIC  MAGNESIUM   TYPE AND SCREEN    EKG EKG Interpretation Date/Time:  Saturday October 14 2023 13:41:55 EDT Ventricular Rate:  92 PR Interval:    QRS Duration:  171 QT Interval:  430 QTC Calculation: 532 R Axis:   144  Text Interpretation: Atrial fibrillation RBBB and LPFB Confirmed by Franklyn Gills [phone]) on 10/14/2023 3:20:07 PM  Radiology CT Angio Abd/Pel W and/or Wo Contrast Result Date: 10/14/2023 CLINICAL DATA:  Lower GI bleed. Coffee ground emesis. History of multiple myeloma. EXAM: CTA ABDOMEN AND PELVIS WITHOUT AND WITH CONTRAST TECHNIQUE: Multidetector CT imaging of the abdomen and pelvis was performed using the standard protocol during bolus administration of intravenous contrast. Multiplanar reconstructed images and MIPs were obtained and reviewed to evaluate the vascular anatomy. RADIATION DOSE REDUCTION: This exam was performed according to the departmental dose-optimization program which includes automated exposure control, adjustment of the mA and/or kV according to patient size and/or use of iterative reconstruction technique. CONTRAST:  OMNIPAQUE  IOHEXOL  350 MG/ML SOLN COMPARISON:  CT 06/08/2023 FINDINGS: VASCULAR Aorta: Normal caliber aorta without aneurysm, dissection, vasculitis or significant stenosis. Moderate aortic atherosclerosis. Celiac: Dense plaque at the origin causes high-grade stenosis with mild poststenotic dilatation. No dissection or  aneurysm. SMA: Patent without evidence of aneurysm, dissection, vasculitis or significant stenosis. Renals: Mild plaque at the origin of both renal arteries without significant stenosis. Both renal arteries are patent without evidence of aneurysm, dissection, vasculitis, or fibromuscular dysplasia. IMA: Patent without evidence of aneurysm, dissection, vasculitis or significant stenosis. Inflow: Coarse plaque at the origin of the common iliac arteries causes less than 50% stenosis. Distal branches are patent. Proximal Outflow: Plaque in the left femoral artery without severe stenosis. No dissection or acute findings. Veins: Venous phase imaging demonstrates no acute findings. Patent portal and splenic veins. Patent mesenteric veins. No portal venous or mesenteric gas. Review of the MIP images confirms the above findings. NON-VASCULAR Lower chest: Minimal left pleural thickening is unchanged from prior exam. Dependent ground-glass opacities may represent hypoventilatory change or infection. Hepatobiliary: No focal liver abnormality is seen. No gallstones, gallbladder wall thickening, or biliary dilatation. Pancreas: No ductal dilatation or inflammation. Spleen: Normal in size without focal abnormality. Adrenals/Urinary Tract: Normal adrenal glands. No hydronephrosis or renal calculi. No suspicious renal abnormality. The urinary bladder is partially distended, mildly thick walled. Stomach/Bowel: No contrast accumulation in the GI tract to localize site of GI bleed. Progressive small bowel distension with suspected transition point in the right hemiabdomen, series 8, image 97. There is adjacent mesenteric edema. Small bowel distal to this is decompressed. Small bowel proximally is dilated and fluid-filled. The stomach is dilated and fluid-filled. Fluid distention of the included distal esophagus. The transverse colon is tortuous, courses posterior to the stomach. Question of underlying mesenteric defect in internal  hernia, but no wall thickening or obstruction at this point. Mild left colonic diverticulosis without diverticulitis. The appendix is not confidently visualized. Lymphatic: No enlarged lymph nodes in the abdomen or pelvis. Reproductive: Prostate is unremarkable. Other: Small volume abdominopelvic ascites, new from prior exam. Generalized body wall edema. There is fluid within both inguinal canals. No free air or focal fluid collection. Small left lumbar hernia contains only fat. Musculoskeletal: Scattered lytic lesions are again seen, not significantly changed. No evidence of pathologic fracture or compression deformity. Multilevel degenerative change in the spine. IMPRESSION: 1. No contrast accumulation in the GI tract to localize site of GI bleed. 2. Progressive small  bowel distension with suspected transition point in the right hemiabdomen, suspicious for small bowel obstruction. 3. Question of internal hernia with transverse colon coursing posterior to the stomach, but no inflammation or obstructive change at this site. 4. Small volume abdominopelvic ascites, new from prior exam. Generalized body wall edema/anasarca. 5. Dependent ground-glass opacities in the lung bases may represent hypoventilatory change or infection. Aspiration is also considered. 6. Known lytic lesions, without significant change. Aortic Atherosclerosis (ICD10-I70.0). Electronically Signed   By: Andrea Gasman M.D.   On: 10/14/2023 15:58   DG Chest Portable 1 View Result Date: 10/14/2023 CLINICAL DATA:  repeated forceful vomiting EXAM: PORTABLE CHEST 1 VIEW COMPARISON:  June 04, 2023, June 08, 2023 FINDINGS: The cardiomediastinal silhouette is unchanged in contour. No pleural effusion. No pneumothorax. Patchy bibasilar platelike opacities, favored to reflect atelectasis. Low lung volume radiograph. Rounded opacity at the LEFT lateral approximate seventh rib, likely a lytic lesion this level. Gaseous distension of bowel beneath the  diaphragm. Possible pathologic fracture of the RIGHT posterior sixth rib, similar compared to prior IMPRESSION: 1. Low lung volume radiograph with bibasilar atelectasis. 2. Rounded opacity at the LEFT lateral approximate seventh rib, likely a lytic lesion. Possible pathologic fracture of the RIGHT posterior sixth rib, similar compared to prior. 3. If clinical concern for intraperitoneal free air, recommend dedicated cross-sectional imaging. Electronically Signed   By: Corean Salter M.D.   On: 10/14/2023 14:18    Procedures Procedures    Medications Ordered in ED Medications  pantoprazole  (PROTONIX ) injection 40 mg (has no administration in time range)  ondansetron  (ZOFRAN ) injection 4 mg (4 mg Intravenous Given 10/14/23 1337)  lactated ringers  bolus 1,000 mL (0 mLs Intravenous Stopped 10/14/23 1513)  iohexol  (OMNIPAQUE ) 350 MG/ML injection 100 mL (100 mLs Intravenous Contrast Given 10/14/23 1518)  metoCLOPramide  (REGLAN ) injection 10 mg (10 mg Intravenous Given 10/14/23 1525)  diphenhydrAMINE  (BENADRYL ) injection 25 mg (25 mg Intravenous Given 10/14/23 1548)    ED Course/ Medical Decision Making/ A&P                          Medical Decision Making Amount and/or Complexity of Data Reviewed Labs: ordered. Decision-making details documented in ED Course. Radiology: ordered. Decision-making details documented in ED Course.  Risk Prescription drug management. Decision regarding hospitalization.    This patient presents to the ED for concern of N/V, abd pain, this involves an extensive number of treatment options, and is a complaint that carries with it a high risk of complications and morbidity.  I considered the following differential and admission for this acute, potentially life threatening condition. Overall non-toxic appearing, afebrile.  MDM:    He has no significant electrolyte derangements or renal injury reassuringly.  He is dehydrated with dry mucous membranes and will fluid  resuscitate.  Patient request water several times and then immediately regurgitates coffee-ground emesis.  High degree of concern for small bowel obstruction.  Also consider acute hepatobiliary disease.  Lower concern for acute appendicitis or diverticulitis. CXR doesn't show any pneumomediastinum but does show lytic lesions. EKG w/o signs of ischemia.  Gastric occult positive. Does take eliquis , last dose was yesterday at 9 am. Angiogram shows no active extrav but does show SBO.  Consider possible intestinal ischemia or other cause of GI bleeding including gastritis/duodenitis/PUD, AVM.  No known liver disease.  Hemoglobin is consistent with priors.  Given Protonix  and will discuss with surgery.  Clinical Course as of 10/14/23 1700  Sat Oct 14, 2023  1418 WBC(!): 16.2 +leukocytosis w/ left shift [HN]  1418 Platelets: 260 okay [HN]  1418 Hemoglobin(!): 10.8 C/w priors [HN]  1419 Patient multiple times requested to try to drink water. He then had significant high volume dark vomit after taking a small sip. Will obtain CT abd pelvis.  [HN]  1456 Occult Blood, Gastric(!): POSITIVE [HN]  1457 Creatinine: 0.72 [HN]  1457 BUN: 14 [HN]  1457 DG Chest Portable 1 View 1. Low lung volume radiograph with bibasilar atelectasis. 2. Rounded opacity at the LEFT lateral approximate seventh rib, likely a lytic lesion. Possible pathologic fracture of the RIGHT posterior sixth rib, similar compared to prior. 3. If clinical concern for intraperitoneal free air, recommend dedicated cross-sectional imaging.   [HN]  1602 CT Angio Abd/Pel W and/or Wo Contrast 1. No contrast accumulation in the GI tract to localize site of GI bleed. 2. Progressive small bowel distension with suspected transition point in the right hemiabdomen, suspicious for small bowel obstruction. 3. Question of internal hernia with transverse colon coursing posterior to the stomach, but no inflammation or obstructive change at this  site. 4. Small volume abdominopelvic ascites, new from prior exam. Generalized body wall edema/anasarca. 5. Dependent ground-glass opacities in the lung bases may represent hypoventilatory change or infection. Aspiration is also considered. 6. Known lytic lesions, without significant change.   [HN]  1607 D/w Dr. Vernetta who will see patient in consult. [HN]  1608 Ordered NG tube and IV protonix  as well. [HN]  1608 Consulted to hospitalist. [HN]  1610 Last eliquis  was 9 am yesterday [HN]    Clinical Course User Index [HN] Franklyn Sid SAILOR, MD    Labs: I Ordered, and personally interpreted labs.  The pertinent results include:  those listed above  Imaging Studies ordered: I ordered imaging studies including CXR I independently visualized and interpreted imaging. I agree with the radiologist interpretation  Additional history obtained from chart review, wife at bedside.    Cardiac Monitoring: The patient was maintained on a cardiac monitor.  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  Patient reports he does not have symptoms as of 02/25/14.     Diarrhea 06/04/2023   Essential hypertension 06/04/2023   Fatty liver 06/04/2023   Flu 06/04/2023   Hyperlipidemia 06/04/2023   Ileus (HCC) 06/04/2023   Neuropathy    Paroxysmal atrial fibrillation (HCC) 07/04/2023   Pneumonia  06/04/2023     Medicines Meds ordered this encounter  Medications   ondansetron  (ZOFRAN ) injection 4 mg   lactated ringers  bolus 1,000 mL   iohexol  (OMNIPAQUE ) 350 MG/ML injection 100 mL   metoCLOPramide  (REGLAN ) injection 10 mg   diphenhydrAMINE  (BENADRYL ) injection 25 mg   diphenhydrAMINE  (BENADRYL ) 50 MG/ML injection    Ezzard Palma C: cabinet override   DISCONTD: pantoprazole  (PROTONIX ) injection 40 mg   pantoprazole  (PROTONIX ) injection 40 mg    I have reviewed the patients home medicines and have  made adjustments as needed  Problem List / ED Course: Problem List Items Addressed This Visit       Digestive   * (Principal) Small bowel obstruction (HCC)   Other Visit Diagnoses       Coffee ground emesis    -  Primary     Uncontrollable vomiting                       This note was created using dictation software, which may contain spelling or grammatical errors.    Franklyn Sid SAILOR, MD 10/14/23 [phone]

## 2023-10-14 NOTE — Assessment & Plan Note (Addendum)
 IgG Kappa MM, diagnosed in march of 2025 Followed by Dr.Dorsey  On Velcade  plus Dexamethasone  and Revlimid , last infusion 10/12/2023. Day 15, cycle 3  weekly treatments, dropped Velcade  to 1 mg/m due to diarrhea  From office note on 7/3: revlimid  25 mg PO on pause x 2 weeks.  Will restart at Revlimid  10 mg p.o. daily 21 days on and 7 days off. David Mcgrath started this on Thursday night (AE of diarrhea)

## 2023-10-14 NOTE — Assessment & Plan Note (Signed)
-  EMG nerve conduction study done on 01/30/2020 by Dr. Margaret confirms severe axonal sensorimotor polyneuropathy as well as moderate right carpal tunnel.  --Currently on gabapentin  600 mg TID.

## 2023-10-15 ENCOUNTER — Inpatient Hospital Stay (HOSPITAL_COMMUNITY)

## 2023-10-15 DIAGNOSIS — K56609 Unspecified intestinal obstruction, unspecified as to partial versus complete obstruction: Secondary | ICD-10-CM | POA: Diagnosis not present

## 2023-10-15 LAB — MAGNESIUM: Magnesium: 2 mg/dL (ref 1.7–2.4)

## 2023-10-15 LAB — CBC
HCT: 28.7 % — ABNORMAL LOW (ref 39.0–52.0)
Hemoglobin: 9.5 g/dL — ABNORMAL LOW (ref 13.0–17.0)
MCH: 31.9 pg (ref 26.0–34.0)
MCHC: 33.1 g/dL (ref 30.0–36.0)
MCV: 96.3 fL (ref 80.0–100.0)
Platelets: 196 K/uL (ref 150–400)
RBC: 2.98 MIL/uL — ABNORMAL LOW (ref 4.22–5.81)
RDW: 16.8 % — ABNORMAL HIGH (ref 11.5–15.5)
WBC: 13.2 K/uL — ABNORMAL HIGH (ref 4.0–10.5)
nRBC: 0 % (ref 0.0–0.2)

## 2023-10-15 LAB — BASIC METABOLIC PANEL WITH GFR
Anion gap: 9 (ref 5–15)
BUN: 20 mg/dL (ref 8–23)
CO2: 30 mmol/L (ref 22–32)
Calcium: 7.7 mg/dL — ABNORMAL LOW (ref 8.9–10.3)
Chloride: 96 mmol/L — ABNORMAL LOW (ref 98–111)
Creatinine, Ser: 0.83 mg/dL (ref 0.61–1.24)
GFR, Estimated: >60 mL/min (ref >60)
Glucose, Bld: 83 mg/dL (ref 70–99)
Potassium: 3 mmol/L — ABNORMAL LOW (ref 3.5–5.1)
Sodium: 135 mmol/L (ref 135–145)

## 2023-10-15 MED ORDER — DIATRIZOATE MEGLUMINE & SODIUM 66-10 % PO SOLN
90.0000 mL | Freq: Once | ORAL | Status: AC
  Administered 2023-10-15: 90 mL via NASOGASTRIC
  Filled 2023-10-15: qty 90

## 2023-10-15 MED ORDER — SODIUM CHLORIDE 0.9 % IV SOLN: INTRAVENOUS | Status: AC

## 2023-10-15 MED ORDER — MENTHOL 3 MG MT LOZG
1.0000 | LOZENGE | OROMUCOSAL | Status: DC | PRN
  Filled 2023-10-15: qty 9

## 2023-10-15 MED ORDER — POTASSIUM CHLORIDE 10 MEQ/100ML IV SOLN
10.0000 meq | INTRAVENOUS | Status: AC
  Administered 2023-10-15 (×4): 10 meq via INTRAVENOUS
  Filled 2023-10-15 (×3): qty 100

## 2023-10-15 MED ORDER — SODIUM CHLORIDE 0.9% FLUSH
10.0000 mL | INTRAVENOUS | Status: DC | PRN
  Administered 2023-10-16: 10 mL

## 2023-10-15 MED ORDER — PHENOL 1.4 % MT LIQD
1.0000 | OROMUCOSAL | Status: DC | PRN
  Administered 2023-10-15: 1 via OROMUCOSAL
  Filled 2023-10-15: qty 177

## 2023-10-15 NOTE — Progress Notes (Signed)
 PROGRESS NOTE  David Mcgrath  DOB: 12/03/1950  PCP: David Skates, DO FMW:[id number]  DOA: 10/14/2023  LOS: 1 day  Hospital Day: 2  Brief narrative: David Mcgrath is a 73 y.o. male with PMH significant for HTN, HLD, paroxysmal A-fib, neuropathy, fatty liver, recent diagnosis of multiple myeloma 06/2023 currently on chemotherapy with Velcade  infusion and oral  Revlimid .  Last treatment was on 10/12/2023, cycle 3. Posttreatment, did not feel good.  Due to diarrhea resumed Revlimid  at a lower dose only.  Continue to have nausea, vomiting, 1 episode of coffee-ground content 7/12, 2 days after the last treatment, patient presented to the ED with complaint of intractable nausea vomiting, coffee-ground emesis.  In the ED, patient is afebrile, blood pressure in low normal range, breathing on room air. Labs with WC count 16.2, hemoglobin 10.8, potassium 3.3 FOBT positive Urinalysis showed cloudy yellow urine with small hemoglobin, large leukocytes  CT angio abdomen pelvis showed  1. No c evidence of GI bleeding 2. Progressive small bowel distension with suspected transition point in the right hemiabdomen, suspicious for small bowel obstruction. 3. Question of internal hernia with transverse colon coursing posterior to the stomach, but no inflammation or obstructive change at this site. 4. Small volume ascites, Generalized body wall edema/anasarca. 5. Dependent ground-glass opacities in the lung bases may represent hypoventilatory change or infection. Aspiration is also considered. 6. Known lytic lesions, without significant change.  Patient was given IV fluid, IV Protonix , IV Zofran , IV Reglan  Admitted to [facility] General Surgery was consulted   Subjective: Patient was seen and examined this morning. Lying on bed.  Not in distress.  Feels uncomfortable because of NG tube in place.  NG tube suction on.  Coffee-ground content noted. Chart reviewed. Had a low grade fever 100.7  last night, This morning, heart rate 80s, blood pressure 120s, breathing on room air Most recent labs from this morning showed WBC count 13.2, hemoglobin 9.5, potassium 3..  Assessment and plan: Small bowel obstruction  Presented with nausea, vomiting  Imagings as above raising suspicion of SBO versus ileus General surgery consulted Conservative management started with NG tube insertion, bowel rest This morning, passing some flatus.  Noted plan of small bowel protocol  No acute upper GI bleeding  Mentioned coffee ground emesis, Hemoccult positive NG tube showing coffee-ground color. CT angio abdomen pelvis with no source of bleeding Could have had some mucosal tear due to intense retching Eliquis  held, last dose was on the morning of 7/12. Hemoglobin chronically low, 9.5 today.  Continue to monitor Continue IV Protonix  twice daily Will get GI consult after bowel obstruction resolves.  May need EGD inpatient or outpatient. Recent Labs    03/14/23 0909 04/25/23 1627 07/13/23 1351 07/31/23 0748 10/12/23 1440 10/14/23 1406 10/14/23 1801 10/14/23 2233 10/15/23 0415  HGB  --    < >  --    < > 11.4* 10.8* 10.8* 10.0* 9.5*  MCV  --    < >  --    < > 93.1 96.8 97.1 96.5 96.3  VITAMINB12 850  --  992*  --   --   --   --   --   --   FOLATE  --   --  18.3  --   --   --   --   --   --   TIBC  --   --  241*  --   --   --   --   --   --  IRON  --   --  70  --   --   --   --   --   --    < > = values in this interval not displayed.   Hypokalemia/hypomagnesemia Potassium low at 3, magnesium  at 1.5 Replacements given.  Potassium is still low at 3 today.  IV replacement ordered. Recent Labs  Lab 10/12/23 1440 10/14/23 1406 10/14/23 1801 10/15/23 0415  K 4.1 3.3*  --  3.0*  MG  --   --  1.5* 2.0   Multiple myeloma IgG Kappa MM, diagnosed in march of 2025 Followed by David Mcgrath  On Velcade  plus Dexamethasone  and Revlimid , last infusion 10/12/2023. Day 15, cycle 3  Was also on Zyvox   twice daily.  Resume the same  Leukocytosis Likely inflammatory as well as with her use of dexamethasone  recently. No evidence of infection Recent Labs  Lab 10/12/23 1440 10/14/23 1406 10/14/23 1801 10/14/23 1802 10/14/23 1947 10/14/23 2233 10/15/23 0415  WBC 8.1 16.2* 13.3*  --   --  14.1* 13.2*  LATICACIDVEN  --   --   --  1.9 1.7  --   --    Paroxysmal atrial fibrillation  Not on rate controlling agents  Chronically anticoagulated with Eliquis , currently on hold Follows up with David Mcgrath   Neuropathy Per report, EMG nerve conduction study done on 01/30/2020 by David Mcgrath confirms severe axonal sensorimotor polyneuropathy as well as moderate right carpal tunnel.  Resume gabapentin  600 mg TID when able to   Essential hypertension Maintained normotensive readings since significant weight loss PTA meds-none   Depression Resume prozac  daily when able to   Hyperlipidemia Resume pravachol and zetia when able to   OSA (obstructive sleep apnea) Oxygen at night until NG tube removed and then cpap     Mobility: Encourage ambulation.  PT eval ordered  Goals of care   Code Status: Full Code     DVT prophylaxis:  SCDs Start: 10/14/23 1729   Antimicrobials: None Fluid: Currently on NS at 75 mL/h Consultants: General Surgery Family Communication: None at bedside  Status: Inpatient Level of care:  Progressive   Patient is from: Home Needs to continue in-hospital care: Bowel obstruction Anticipated d/c to: Pending clinical course, pending PT recommendations    Diet:  Diet Order             Diet NPO time specified  Diet effective now                   Scheduled Meds:  diatrizoate  meglumine -sodium  90 mL Per NG tube Once   pantoprazole  (PROTONIX ) IV  40 mg Intravenous Q12H    PRN meds: acetaminophen  **OR** acetaminophen , menthol -cetylpyridinium, metoprolol  tartrate, morphine  injection, ondansetron  **OR** ondansetron  (ZOFRAN ) IV, phenol    Infusions:   sodium chloride  75 mL/hr at 10/15/23 0850   potassium chloride       Antimicrobials: Anti-infectives (From admission, onward)    None       Objective: Vitals:   10/15/23 0128 10/15/23 0659  BP: (!) 109/59 121/65  Pulse: (!) 54 80  Resp: 20 (!) 22  Temp: 99 F (37.2 C) 98.2 F (36.8 C)  SpO2: 98% 99%    Intake/Output Summary (Last 24 hours) at 10/15/2023 1140 Last data filed at 10/15/2023 0500 Gross per 24 hour  Intake 1137.09 ml  Output 1855 ml  Net -717.91 ml   Filed Weights   10/14/23 1229  Weight: 102 kg   Weight change:  Body mass  index is 27.37 kg/m.   Physical Exam: General exam: Pleasant, elderly Caucasian male.  In distress due to NG tube Skin: No rashes, lesions or ulcers. HEENT: Atraumatic, normocephalic, no obvious bleeding Lungs: Clear to auscultation bilaterally,  CVS: S1, S2, no murmur,   GI/Abd: Soft, nontender, distended, bowel sound sluggish, NG tube with coffee-ground colored content CNS: Alert, awake, oriented x 3 Psychiatry: Mood appropriate Extremities: No pedal edema, no calf tenderness,   Data Review: I have personally reviewed the laboratory data and studies available.  F/u labs  Unresulted Labs (From admission, onward)     Start     Ordered   Unscheduled  CBC with Differential/Platelet  Daily,   R      10/15/23 1140   Unscheduled  Basic metabolic panel with GFR  Daily,   R      10/15/23 1140            Signed, Chapman Rota, MD [facility] 10/15/2023

## 2023-10-15 NOTE — Progress Notes (Signed)
 Subjective/Chief Complaint: Having some flatus, no ab pain   Objective: Vital signs in last 24 hours: Temp:  [97.5 F (36.4 C)-100.7 F (38.2 C)] 98.2 F (36.8 C) (07/13 0659) Pulse Rate:  [47-116] 80 (07/13 0659) Resp:  [16-28] 22 (07/13 0659) BP: (105-139)/(53-79) 121/65 (07/13 0659) SpO2:  [94 %-99 %] 99 % (07/13 0659) Weight:  [102 kg] 102 kg (07/12 1229) Last BM Date : 10/13/23  Intake/Output from previous day: 07/12 0701 - 07/13 0700 In: 1137.1 [I.V.:65.1; IV Piggyback:1072] Out: 1855 [Urine:30; Emesis/NG output:1825] Intake/Output this shift: No intake/output data recorded.  Ab soft nontender nondistended  Lab Results:  Recent Labs    10/14/23 2233 10/15/23 0415  WBC 14.1* 13.2*  HGB 10.0* 9.5*  HCT 30.2* 28.7*  PLT 218 196   BMET Recent Labs    10/14/23 1406 10/15/23 0415  NA 137 135  K 3.3* 3.0*  CL 96* 96*  CO2 30 30  GLUCOSE 106* 83  BUN 14 20  CREATININE 0.72 0.83  CALCIUM  8.6* 7.7*   PT/INR Recent Labs    10/14/23 1406  LABPROT 17.5*  INR 1.4*   ABG No results for input(s): PHART, HCO3 in the last 72 hours.  Invalid input(s): PCO2, PO2  Studies/Results: CT Angio Abd/Pel W and/or Wo Contrast Result Date: 10/14/2023 CLINICAL DATA:  Lower GI bleed. Coffee ground emesis. History of multiple myeloma. EXAM: CTA ABDOMEN AND PELVIS WITHOUT AND WITH CONTRAST TECHNIQUE: Multidetector CT imaging of the abdomen and pelvis was performed using the standard protocol during bolus administration of intravenous contrast. Multiplanar reconstructed images and MIPs were obtained and reviewed to evaluate the vascular anatomy. RADIATION DOSE REDUCTION: This exam was performed according to the departmental dose-optimization program which includes automated exposure control, adjustment of the mA and/or kV according to patient size and/or use of iterative reconstruction technique. CONTRAST:  OMNIPAQUE  IOHEXOL  350 MG/ML SOLN COMPARISON:  CT  06/08/2023 FINDINGS: VASCULAR Aorta: Normal caliber aorta without aneurysm, dissection, vasculitis or significant stenosis. Moderate aortic atherosclerosis. Celiac: Dense plaque at the origin causes high-grade stenosis with mild poststenotic dilatation. No dissection or aneurysm. SMA: Patent without evidence of aneurysm, dissection, vasculitis or significant stenosis. Renals: Mild plaque at the origin of both renal arteries without significant stenosis. Both renal arteries are patent without evidence of aneurysm, dissection, vasculitis, or fibromuscular dysplasia. IMA: Patent without evidence of aneurysm, dissection, vasculitis or significant stenosis. Inflow: Coarse plaque at the origin of the common iliac arteries causes less than 50% stenosis. Distal branches are patent. Proximal Outflow: Plaque in the left femoral artery without severe stenosis. No dissection or acute findings. Veins: Venous phase imaging demonstrates no acute findings. Patent portal and splenic veins. Patent mesenteric veins. No portal venous or mesenteric gas. Review of the MIP images confirms the above findings. NON-VASCULAR Lower chest: Minimal left pleural thickening is unchanged from prior exam. Dependent ground-glass opacities may represent hypoventilatory change or infection. Hepatobiliary: No focal liver abnormality is seen. No gallstones, gallbladder wall thickening, or biliary dilatation. Pancreas: No ductal dilatation or inflammation. Spleen: Normal in size without focal abnormality. Adrenals/Urinary Tract: Normal adrenal glands. No hydronephrosis or renal calculi. No suspicious renal abnormality. The urinary bladder is partially distended, mildly thick walled. Stomach/Bowel: No contrast accumulation in the GI tract to localize site of GI bleed. Progressive small bowel distension with suspected transition point in the right hemiabdomen, series 8, image 97. There is adjacent mesenteric edema. Small bowel distal to this is  decompressed. Small bowel proximally is dilated and fluid-filled.  The stomach is dilated and fluid-filled. Fluid distention of the included distal esophagus. The transverse colon is tortuous, courses posterior to the stomach. Question of underlying mesenteric defect in internal hernia, but no wall thickening or obstruction at this point. Mild left colonic diverticulosis without diverticulitis. The appendix is not confidently visualized. Lymphatic: No enlarged lymph nodes in the abdomen or pelvis. Reproductive: Prostate is unremarkable. Other: Small volume abdominopelvic ascites, new from prior exam. Generalized body wall edema. There is fluid within both inguinal canals. No free air or focal fluid collection. Small left lumbar hernia contains only fat. Musculoskeletal: Scattered lytic lesions are again seen, not significantly changed. No evidence of pathologic fracture or compression deformity. Multilevel degenerative change in the spine. IMPRESSION: 1. No contrast accumulation in the GI tract to localize site of GI bleed. 2. Progressive small bowel distension with suspected transition point in the right hemiabdomen, suspicious for small bowel obstruction. 3. Question of internal hernia with transverse colon coursing posterior to the stomach, but no inflammation or obstructive change at this site. 4. Small volume abdominopelvic ascites, new from prior exam. Generalized body wall edema/anasarca. 5. Dependent ground-glass opacities in the lung bases may represent hypoventilatory change or infection. Aspiration is also considered. 6. Known lytic lesions, without significant change. Aortic Atherosclerosis (ICD10-I70.0). Electronically Signed   By: Andrea Gasman M.D.   On: 10/14/2023 15:58   DG Chest Portable 1 View Result Date: 10/14/2023 CLINICAL DATA:  repeated forceful vomiting EXAM: PORTABLE CHEST 1 VIEW COMPARISON:  June 04, 2023, June 08, 2023 FINDINGS: The cardiomediastinal silhouette is unchanged in  contour. No pleural effusion. No pneumothorax. Patchy bibasilar platelike opacities, favored to reflect atelectasis. Low lung volume radiograph. Rounded opacity at the LEFT lateral approximate seventh rib, likely a lytic lesion this level. Gaseous distension of bowel beneath the diaphragm. Possible pathologic fracture of the RIGHT posterior sixth rib, similar compared to prior IMPRESSION: 1. Low lung volume radiograph with bibasilar atelectasis. 2. Rounded opacity at the LEFT lateral approximate seventh rib, likely a lytic lesion. Possible pathologic fracture of the RIGHT posterior sixth rib, similar compared to prior. 3. If clinical concern for intraperitoneal free air, recommend dedicated cross-sectional imaging. Electronically Signed   By: Corean Salter M.D.   On: 10/14/2023 14:18    Anti-infectives: Anti-infectives (From admission, onward)    None       Assessment/Plan: SBO vs ileus -having some flatus, ng with high output -ct reviewed and radiology concerns -clinically does not need any operation -will do protocol today -pharm dvt prophylaxis recommended  I reviewed hospitalist notes, last 24 h vitals and pain scores, last 48 h intake and output, last 24 h labs and trends, and last 24 h imaging results.   David Mcgrath 10/15/2023

## 2023-10-15 NOTE — Progress Notes (Signed)
   10/15/23 1145  TOC Brief Assessment  Insurance and Status Reviewed  Patient has primary care physician Yes  Home environment has been reviewed single family home  Prior level of function: independent  Prior/Current Home Services No current home services  Social Drivers of Health Review SDOH reviewed no interventions necessary  Readmission risk has been reviewed Yes  Transition of care needs transition of care needs identified, TOC will continue to follow    Heather Saltness, MSW, LCSW 10/15/2023 11:46 AM

## 2023-10-16 ENCOUNTER — Inpatient Hospital Stay (HOSPITAL_COMMUNITY)

## 2023-10-16 DIAGNOSIS — K56609 Unspecified intestinal obstruction, unspecified as to partial versus complete obstruction: Secondary | ICD-10-CM | POA: Diagnosis not present

## 2023-10-16 LAB — CBC WITH DIFFERENTIAL/PLATELET
Abs Immature Granulocytes: 0.03 K/uL (ref 0.00–0.07)
Basophils Absolute: 0 K/uL (ref 0.0–0.1)
Basophils Relative: 0 %
Eosinophils Absolute: 0.1 K/uL (ref 0.0–0.5)
Eosinophils Relative: 1 %
HCT: 27.8 % — ABNORMAL LOW (ref 39.0–52.0)
Hemoglobin: 9.2 g/dL — ABNORMAL LOW (ref 13.0–17.0)
Immature Granulocytes: 0 %
Lymphocytes Relative: 6 %
Lymphs Abs: 0.5 K/uL — ABNORMAL LOW (ref 0.7–4.0)
MCH: 32.3 pg (ref 26.0–34.0)
MCHC: 33.1 g/dL (ref 30.0–36.0)
MCV: 97.5 fL (ref 80.0–100.0)
Monocytes Absolute: 0.9 K/uL (ref 0.1–1.0)
Monocytes Relative: 9 %
Neutro Abs: 8.2 K/uL — ABNORMAL HIGH (ref 1.7–7.7)
Neutrophils Relative %: 84 %
Platelets: 150 K/uL (ref 150–400)
RBC: 2.85 MIL/uL — ABNORMAL LOW (ref 4.22–5.81)
RDW: 16.6 % — ABNORMAL HIGH (ref 11.5–15.5)
WBC: 9.7 K/uL (ref 4.0–10.5)
nRBC: 0 % (ref 0.0–0.2)

## 2023-10-16 LAB — BASIC METABOLIC PANEL WITH GFR
Anion gap: 10 (ref 5–15)
BUN: 23 mg/dL (ref 8–23)
CO2: 28 mmol/L (ref 22–32)
Calcium: 7 mg/dL — ABNORMAL LOW (ref 8.9–10.3)
Chloride: 98 mmol/L (ref 98–111)
Creatinine, Ser: 0.72 mg/dL (ref 0.61–1.24)
GFR, Estimated: >60 mL/min (ref >60)
Glucose, Bld: 72 mg/dL (ref 70–99)
Potassium: 2.8 mmol/L — ABNORMAL LOW (ref 3.5–5.1)
Sodium: 136 mmol/L (ref 135–145)

## 2023-10-16 MED ORDER — POTASSIUM CHLORIDE 10 MEQ/100ML IV SOLN
10.0000 meq | INTRAVENOUS | Status: AC
  Administered 2023-10-16 – 2023-10-17 (×4): 10 meq via INTRAVENOUS
  Filled 2023-10-16 (×4): qty 100

## 2023-10-16 MED ORDER — POTASSIUM CHLORIDE 10 MEQ/100ML IV SOLN
10.0000 meq | INTRAVENOUS | Status: AC
  Administered 2023-10-16 (×4): 10 meq via INTRAVENOUS
  Filled 2023-10-16 (×4): qty 100

## 2023-10-16 MED ORDER — HEPARIN SODIUM (PORCINE) 5000 UNIT/ML IJ SOLN
5000.0000 [IU] | Freq: Three times a day (TID) | INTRAMUSCULAR | Status: DC
  Administered 2023-10-16 – 2023-10-18 (×7): 5000 [IU] via SUBCUTANEOUS
  Filled 2023-10-16 (×8): qty 1

## 2023-10-16 MED ORDER — LACTATED RINGERS IV BOLUS
1000.0000 mL | Freq: Once | INTRAVENOUS | Status: AC
  Administered 2023-10-16: 1000 mL via INTRAVENOUS

## 2023-10-16 NOTE — Progress Notes (Signed)
 PROGRESS NOTE  David Mcgrath  DOB: May 06, 1950  PCP: [name], DO FMW:[id number]  DOA: 10/14/2023  LOS: 2 days  Hospital Day: 3  Brief narrative: David Mcgrath is a 73 y.o. male with PMH significant for HTN, HLD, paroxysmal A-fib, neuropathy, fatty liver, recent diagnosis of multiple myeloma 06/2023 currently on chemotherapy with Velcade  infusion and oral  Revlimid .  Last treatment was on 10/12/2023, cycle 3. Posttreatment, did not feel good.  Due to diarrhea resumed Revlimid  at a lower dose only.  Continue to have nausea, vomiting, 1 episode of coffee-ground content 7/12, 2 days after the last treatment, patient presented to the ED with complaint of intractable nausea vomiting, coffee-ground emesis.  In the ED, patient is afebrile, blood pressure in low normal range, breathing on room air. Labs with WC count 16.2, hemoglobin 10.8, potassium 3.3 FOBT positive Urinalysis showed cloudy yellow urine with small hemoglobin, large leukocytes  CT angio abdomen pelvis showed  1. No c evidence of GI bleeding 2. Progressive small bowel distension with suspected transition point in the right hemiabdomen, suspicious for small bowel obstruction. 3. Question of internal hernia with transverse colon coursing posterior to the stomach, but no inflammation or obstructive change at this site. 4. Small volume ascites, Generalized body wall edema/anasarca. 5. Dependent ground-glass opacities in the lung bases may represent hypoventilatory change or infection. Aspiration is also considered. 6. Known lytic lesions, without significant change.  Patient was given IV fluid, IV Protonix , IV Zofran , IV Reglan  Admitted to [facility] General Surgery was consulted   Subjective: Patient was seen and examined this morning. Lying on bed.  NG tube to suction.  Continues to have high output No bowel movement. Underwent SBO protocol yesterday.  8-hour film with persistent SBO  Assessment and  plan: Small bowel obstruction  Presented with nausea, vomiting  Imagings as above raising suspicion of SBO versus ileus General surgery consulted Conservative management started with NG tube insertion, bowel rest This morning, passing some flatus. 7/13, underwent SBO protocol.  Abdominal film with persistent obstruction.  No acute upper GI bleeding  Mentioned coffee ground emesis, Hemoccult positive NG tube showed coffee-ground color. CT angio abdomen pelvis did not show any source of bleeding Patient could have had esophageal mucosal tear due to intense retching Eliquis  was held, last dose was on the morning of 7/12. Hemoglobin chronically low, 9.5 today. Continue to monitor. Continue IV Protonix  twice daily NG tube bilious output Will get GI consult after bowel obstruction resolves.  May need EGD inpatient or outpatient. Recent Labs    03/14/23 0909 04/25/23 1627 07/13/23 1351 07/31/23 0748 10/14/23 1406 10/14/23 1801 10/14/23 2233 10/15/23 0415 10/16/23 0248  HGB  --    < >  --    < > 10.8* 10.8* 10.0* 9.5* 9.2*  MCV  --    < >  --    < > 96.8 97.1 96.5 96.3 97.5  VITAMINB12 850  --  992*  --   --   --   --   --   --   FOLATE  --   --  18.3  --   --   --   --   --   --   TIBC  --   --  241*  --   --   --   --   --   --   IRON  --   --  70  --   --   --   --   --   --    < > =  values in this interval not displayed.   Hypokalemia/hypomagnesemia Potassium remains low at 2.8 this morning.  Replacement given. Continue to monitor Recent Labs  Lab 10/12/23 1440 10/14/23 1406 10/14/23 1801 10/15/23 0415 10/16/23 0248  K 4.1 3.3*  --  3.0* 2.8*  MG  --   --  1.5* 2.0  --    Multiple myeloma IgG Kappa MM, diagnosed in march of 2025 Followed by Dr.Dorsey  Was on Velcade  plus Dexamethasone  and Revlimid , last infusion 10/12/2023. Day 15, cycle 3  Was also on Zyvox  twice daily.  Resume the same  Leukocytosis Likely inflammatory as well as with her use of dexamethasone   recently. No evidence of infection Recent Labs  Lab 10/14/23 1406 10/14/23 1801 10/14/23 1802 10/14/23 1947 10/14/23 2233 10/15/23 0415 10/16/23 0248  WBC 16.2* 13.3*  --   --  14.1* 13.2* 9.7  LATICACIDVEN  --   --  1.9 1.7  --   --   --    Paroxysmal atrial fibrillation  Not on rate controlling agents  Chronically anticoagulated with Eliquis , currently on hold. Follows up with Dr. Ladona   Neuropathy Per report, EMG nerve conduction study done on 01/30/2020 by Dr. Margaret confirms severe axonal sensorimotor polyneuropathy as well as moderate right carpal tunnel.  Resume gabapentin  600 mg TID when able to   Essential hypertension Maintained normotensive readings since significant weight loss PTA meds-none   Depression Resume prozac  daily when able to   Hyperlipidemia Resume pravachol and zetia when able to   OSA (obstructive sleep apnea) Oxygen at night until NG tube removed and then cpap     Mobility: Encourage ambulation.  PT eval obtained.  Home with PT recommended  Goals of care   Code Status: Full Code     DVT prophylaxis:  heparin  injection 5,000 Units Start: 10/16/23 1400 SCDs Start: 10/14/23 1729   Antimicrobials: None Fluid: Currently on NS at 75 mL/h Consultants: General Surgery Family Communication: None at bedside  Status: Inpatient Level of care:  Progressive   Patient is from: Home Needs to continue in-hospital care: Bowel obstruction not resolved yet. Anticipated d/c to: Pending clinical course, pending PT recommendations    Diet:  Diet Order             Diet NPO time specified  Diet effective now                   Scheduled Meds:  heparin  injection (subcutaneous)  5,000 Units Subcutaneous Q8H   pantoprazole  (PROTONIX ) IV  40 mg Intravenous Q12H    PRN meds: acetaminophen  **OR** acetaminophen , menthol -cetylpyridinium, metoprolol  tartrate, morphine  injection, ondansetron  **OR** ondansetron  (ZOFRAN ) IV, phenol, sodium  chloride flush   Infusions:     Antimicrobials: Anti-infectives (From admission, onward)    None       Objective: Vitals:   10/15/23 2115 10/16/23 0357  BP: 135/78 122/65  Pulse: 83   Resp: 16 17  Temp: 98.1 F (36.7 C) 97.8 F (36.6 C)  SpO2: 99% 95%    Intake/Output Summary (Last 24 hours) at 10/16/2023 1401 Last data filed at 10/16/2023 1016 Gross per 24 hour  Intake 340.25 ml  Output 3100 ml  Net -2759.75 ml   Filed Weights   10/14/23 1229  Weight: 102 kg   Weight change:  Body mass index is 27.37 kg/m.   Physical Exam: General exam: Pleasant, elderly Caucasian male.  Skin: No rashes, lesions or ulcers. HEENT: Atraumatic, normocephalic, no obvious bleeding Lungs: Clear to auscultation bilaterally,  CVS: S1, S2, no murmur,   GI/Abd: Soft, nontender, distended, bowel sound sluggish, NG tube with bilious output CNS: Alert, awake, oriented x 3 Psychiatry: Mood appropriate Extremities: No pedal edema, no calf tenderness,   Data Review: I have personally reviewed the laboratory data and studies available.  F/u labs  Unresulted Labs (From admission, onward)     Start     Ordered   10/16/23 0500  CBC with Differential/Platelet  Daily,   R      10/15/23 1140   10/16/23 0500  Basic metabolic panel with GFR  Daily,   R      10/15/23 1140            Signed, Chapman Rota, MD [facility] 10/16/2023

## 2023-10-16 NOTE — Progress Notes (Signed)
 Subjective: Patient with small amount of flatus.  High NGT output.  Has already put out 1L since shift change this morning.  No BM.  No abdominal pain this am.  Eating about 2 cups of ice per day.  ROS: See above, otherwise other systems negative  Objective: Vital signs in last 24 hours: Temp:  [97.7 F (36.5 C)-98.1 F (36.7 C)] 97.8 F (36.6 C) (07/14 0357) Pulse Rate:  [83] 83 (07/13 2115) Resp:  [16-20] 17 (07/14 0357) BP: (116-135)/(65-78) 122/65 (07/14 0357) SpO2:  [94 %-99 %] 95 % (07/14 0357) Last BM Date : 10/13/23  Intake/Output from previous day: 07/13 0701 - 07/14 0700 In: 340.3 [I.V.:140.3; IV Piggyback:200] Out: 2300 [Urine:800; Emesis/NG output:1500] Intake/Output this shift: No intake/output data recorded.  PE: Gen: NAD Abd: soft, not really tender, NGT with high bilious output  Lab Results:  Recent Labs    10/15/23 0415 10/16/23 0248  WBC 13.2* 9.7  HGB 9.5* 9.2*  HCT 28.7* 27.8*  PLT 196 150   BMET Recent Labs    10/15/23 0415 10/16/23 0248  NA 135 136  K 3.0* 2.8*  CL 96* 98  CO2 30 28  GLUCOSE 83 72  BUN 20 23  CREATININE 0.83 0.72  CALCIUM  7.7* 7.0*   PT/INR Recent Labs    10/14/23 1406  LABPROT 17.5*  INR 1.4*   CMP     Component Value Date/Time   NA 136 10/16/2023 0248   NA 141 08/02/2023 0942   K 2.8 (L) 10/16/2023 0248   CL 98 10/16/2023 0248   CO2 28 10/16/2023 0248   GLUCOSE 72 10/16/2023 0248   BUN 23 10/16/2023 0248   BUN 7 (L) 08/02/2023 0942   CREATININE 0.72 10/16/2023 0248   CREATININE 0.67 10/12/2023 1440   CALCIUM  7.0 (L) 10/16/2023 0248   PROT 5.4 (L) 10/14/2023 1406   PROT 7.0 01/09/2020 1445   ALBUMIN 2.7 (L) 10/14/2023 1406   AST 33 10/14/2023 1406   AST 37 10/12/2023 1440   ALT 33 10/14/2023 1406   ALT 35 10/12/2023 1440   ALKPHOS 45 10/14/2023 1406   BILITOT 0.4 10/14/2023 1406   BILITOT 0.3 10/12/2023 1440   GFRNONAA >60 10/16/2023 0248   GFRNONAA >60 10/12/2023 1440   Lipase  No  results found for: LIPASE     Studies/Results: DG Abd Portable 1V-Small Bowel Obstruction Protocol-initial, 8 hr delay Result Date: 10/15/2023 CLINICAL DATA:  Small-bowel obstruction-8 hour delay EXAM: PORTABLE ABDOMEN - 1 VIEW COMPARISON:  CT abdomen pelvis 10/14/2023 FINDINGS: Persistent dilation of the small bowel. Contrast is not definitively visualized the on the stomach. Contrast in the bladder from prior CT. IMPRESSION: Persistent small bowel obstruction. Electronically Signed   By: Norman Gatlin M.D.   On: 10/15/2023 18:39   CT Angio Abd/Pel W and/or Wo Contrast Result Date: 10/14/2023 CLINICAL DATA:  Lower GI bleed. Coffee ground emesis. History of multiple myeloma. EXAM: CTA ABDOMEN AND PELVIS WITHOUT AND WITH CONTRAST TECHNIQUE: Multidetector CT imaging of the abdomen and pelvis was performed using the standard protocol during bolus administration of intravenous contrast. Multiplanar reconstructed images and MIPs were obtained and reviewed to evaluate the vascular anatomy. RADIATION DOSE REDUCTION: This exam was performed according to the departmental dose-optimization program which includes automated exposure control, adjustment of the mA and/or kV according to patient size and/or use of iterative reconstruction technique. CONTRAST:  OMNIPAQUE  IOHEXOL  350 MG/ML SOLN COMPARISON:  CT 06/08/2023 FINDINGS: VASCULAR Aorta: Normal caliber  aorta without aneurysm, dissection, vasculitis or significant stenosis. Moderate aortic atherosclerosis. Celiac: Dense plaque at the origin causes high-grade stenosis with mild poststenotic dilatation. No dissection or aneurysm. SMA: Patent without evidence of aneurysm, dissection, vasculitis or significant stenosis. Renals: Mild plaque at the origin of both renal arteries without significant stenosis. Both renal arteries are patent without evidence of aneurysm, dissection, vasculitis, or fibromuscular dysplasia. IMA: Patent without evidence of aneurysm,  dissection, vasculitis or significant stenosis. Inflow: Coarse plaque at the origin of the common iliac arteries causes less than 50% stenosis. Distal branches are patent. Proximal Outflow: Plaque in the left femoral artery without severe stenosis. No dissection or acute findings. Veins: Venous phase imaging demonstrates no acute findings. Patent portal and splenic veins. Patent mesenteric veins. No portal venous or mesenteric gas. Review of the MIP images confirms the above findings. NON-VASCULAR Lower chest: Minimal left pleural thickening is unchanged from prior exam. Dependent ground-glass opacities may represent hypoventilatory change or infection. Hepatobiliary: No focal liver abnormality is seen. No gallstones, gallbladder wall thickening, or biliary dilatation. Pancreas: No ductal dilatation or inflammation. Spleen: Normal in size without focal abnormality. Adrenals/Urinary Tract: Normal adrenal glands. No hydronephrosis or renal calculi. No suspicious renal abnormality. The urinary bladder is partially distended, mildly thick walled. Stomach/Bowel: No contrast accumulation in the GI tract to localize site of GI bleed. Progressive small bowel distension with suspected transition point in the right hemiabdomen, series 8, image 97. There is adjacent mesenteric edema. Small bowel distal to this is decompressed. Small bowel proximally is dilated and fluid-filled. The stomach is dilated and fluid-filled. Fluid distention of the included distal esophagus. The transverse colon is tortuous, courses posterior to the stomach. Question of underlying mesenteric defect in internal hernia, but no wall thickening or obstruction at this point. Mild left colonic diverticulosis without diverticulitis. The appendix is not confidently visualized. Lymphatic: No enlarged lymph nodes in the abdomen or pelvis. Reproductive: Prostate is unremarkable. Other: Small volume abdominopelvic ascites, new from prior exam. Generalized body  wall edema. There is fluid within both inguinal canals. No free air or focal fluid collection. Small left lumbar hernia contains only fat. Musculoskeletal: Scattered lytic lesions are again seen, not significantly changed. No evidence of pathologic fracture or compression deformity. Multilevel degenerative change in the spine. IMPRESSION: 1. No contrast accumulation in the GI tract to localize site of GI bleed. 2. Progressive small bowel distension with suspected transition point in the right hemiabdomen, suspicious for small bowel obstruction. 3. Question of internal hernia with transverse colon coursing posterior to the stomach, but no inflammation or obstructive change at this site. 4. Small volume abdominopelvic ascites, new from prior exam. Generalized body wall edema/anasarca. 5. Dependent ground-glass opacities in the lung bases may represent hypoventilatory change or infection. Aspiration is also considered. 6. Known lytic lesions, without significant change. Aortic Atherosclerosis (ICD10-I70.0). Electronically Signed   By: Andrea Gasman M.D.   On: 10/14/2023 15:58   DG Chest Portable 1 View Result Date: 10/14/2023 CLINICAL DATA:  repeated forceful vomiting EXAM: PORTABLE CHEST 1 VIEW COMPARISON:  June 04, 2023, June 08, 2023 FINDINGS: The cardiomediastinal silhouette is unchanged in contour. No pleural effusion. No pneumothorax. Patchy bibasilar platelike opacities, favored to reflect atelectasis. Low lung volume radiograph. Rounded opacity at the LEFT lateral approximate seventh rib, likely a lytic lesion this level. Gaseous distension of bowel beneath the diaphragm. Possible pathologic fracture of the RIGHT posterior sixth rib, similar compared to prior IMPRESSION: 1. Low lung volume radiograph with bibasilar atelectasis. 2.  Rounded opacity at the LEFT lateral approximate seventh rib, likely a lytic lesion. Possible pathologic fracture of the RIGHT posterior sixth rib, similar compared to prior. 3.  If clinical concern for intraperitoneal free air, recommend dedicated cross-sectional imaging. Electronically Signed   By: Corean Salter M.D.   On: 10/14/2023 14:18    Anti-infectives: Anti-infectives (From admission, onward)    None        Assessment/Plan SBO -significant NGT output.   1500cc yesterday, 1L so far today since shift change. -small amount of flatus, no BM -no abdominal pain and ND currently -8-hr delay film with persistent SBO.  Repeat 24 hrs film now. -concerned given high output and lack of bowel function that patient may end up needing at least a dx lap.  Will review new film and make further decisions.   -K 2.8, replace per medicine, in process. -continue to hold Eliquis , may start heparin  gtt if needs to. -spoke to wife at bedside.  Spoke to RN on ward  FEN - NPO/NGT/IVFs VTE - ok for prophylaxis from our standpoint ID - none currently needed  MM - on oral chemo PAF HTN Depression HLD OSA  I reviewed hospitalist notes, last 24 h vitals and pain scores, last 48 h intake and output, last 24 h labs and trends, and last 24 h imaging results.   LOS: 2 days    Burnard FORBES Banter , [facility] 10/16/2023, 10:17 AM Please see Amion for pager number during day hours 7:00am-4:30pm or 7:00am -11:30am on weekends

## 2023-10-16 NOTE — Evaluation (Signed)
 Physical Therapy Evaluation Patient Details Name: David Mcgrath MRN: [id number] DOB: 12-16-1950 Today's Date: 10/16/2023  History of Present Illness  73 y.o. male adm with N/V, 1 episode of coffee ground emesis;  CT = Progressive small bowel distension with suspected transition point in the right hemiabdomen, suspicious for small bowel obstruction.  PMH significant for HTN, HLD, paroxysmal A-fib, neuropathy, fatty liver, recent diagnosis of multiple myeloma 06/2023 currently on chemotherapy  Clinical Impression  Pt admitted with above diagnosis.  Pt reports he  is mod I at baseline, has been getting HHPT/OT since last admission. Pt only agreeable to bed level activity at this time, educated  on importance of mobility with regard to dx. Pt continued to decline amb/OOB d/t pain from NGT. RN gave IV pain meds during session.  Will follow in acute setting. Will likely need to resume HHPT at d/c    Pt currently with functional limitations due to the deficits listed below (see PT Problem List). Pt will benefit from acute skilled PT to increase their independence and safety with mobility to allow discharge.           If plan is discharge home, recommend the following: Assistance with cooking/housework;Help with stairs or ramp for entrance   Can travel by private vehicle        Equipment Recommendations None recommended by PT  Recommendations for Other Services       Functional Status Assessment Patient has had a recent decline in their functional status and demonstrates the ability to make significant improvements in function in a reasonable and predictable amount of time.     Precautions / Restrictions Precautions Precautions: Fall Restrictions Weight Bearing Restrictions Per Provider Order: No      Mobility  Bed Mobility               General bed mobility comments: pt declined d/t pain from NGT. pt getting morphine  every 3 hrs per his report. reviewed importance of  mobility given SOB and pt continued to decline for today--next time    Transfers                        Ambulation/Gait                  Stairs            Wheelchair Mobility     Tilt Bed    Modified Rankin (Stroke Patients Only)       Balance Overall balance assessment:  (denies falls)                                           Pertinent Vitals/Pain Pain Assessment Pain Assessment: Faces Faces Pain Scale: Hurts even more Pain Location: NGT/nose Pain Descriptors / Indicators: Sore Pain Intervention(s): Monitored during session, RN gave pain meds during session, Patient requesting pain meds-RN notified    Home Living Family/patient expects to be discharged to:: Private residence Living Arrangements: Spouse/significant other Available Help at Discharge: Family Type of Home: House Home Access: Stairs to enter Entrance Stairs-Rails: Left Entrance Stairs-Number of Steps: 2   Home Layout: One level Home Equipment: Pharmacist, hospital (2 wheels);Cane - single point      Prior Function Prior Level of Function : Independent/Modified Independent             Mobility Comments: uses  RW d/t neuropathy progression, typically does balance exercises at home ADLs Comments: Mod I with ADLs, uses shower chair.     Extremity/Trunk Assessment   Upper Extremity Assessment Upper Extremity Assessment: Overall WFL for tasks assessed;Defer to OT evaluation    Lower Extremity Assessment Lower Extremity Assessment: Overall WFL for tasks assessed       Communication   Communication Communication: No apparent difficulties    Cognition Arousal: Alert Behavior During Therapy: WFL for tasks assessed/performed   PT - Cognitive impairments: No apparent impairments                         Following commands: Intact       Cueing Cueing Techniques: Verbal cues     General Comments      Exercises      Assessment/Plan    PT Assessment Patient needs continued PT services  PT Problem List Decreased activity tolerance;Decreased mobility       PT Treatment Interventions DME instruction;Therapeutic exercise;Gait training;Functional mobility training;Therapeutic activities;Patient/family education;Balance training    PT Goals (Current goals can be found in the Care Plan section)  Acute Rehab PT Goals Patient Stated Goal: get rid of NGT PT Goal Formulation: With patient Time For Goal Achievement: 10/30/23 Potential to Achieve Goals: Good    Frequency Min 2X/week     Co-evaluation               AM-PAC PT 6 Clicks Mobility  Outcome Measure Help needed turning from your back to your side while in a flat bed without using bedrails?: A Little Help needed moving from lying on your back to sitting on the side of a flat bed without using bedrails?: A Little Help needed moving to and from a bed to a chair (including a wheelchair)?: A Little Help needed standing up from a chair using your arms (e.g., wheelchair or bedside chair)?: A Little Help needed to walk in hospital room?: A Little Help needed climbing 3-5 steps with a railing? : A Little 6 Click Score: 18    End of Session   Activity Tolerance: Patient tolerated treatment well;Patient limited by pain (declined OOB activity) Patient left: in bed;with family/visitor present;with call bell/phone within reach;with bed alarm set;with nursing/sitter in room   PT Visit Diagnosis: Other abnormalities of gait and mobility (R26.89)    Time: 8866-8852 PT Time Calculation (min) (ACUTE ONLY): 14 min   Charges:   PT Evaluation $PT Eval Low Complexity: 1 Low   PT General Charges $$ ACUTE PT VISIT: 1 Visit         Jaena Brocato, PT  Acute Rehab Dept [facility]) [phone]  10/16/2023   [facility] 10/16/2023, 11:57 AM

## 2023-10-16 NOTE — TOC Initial Note (Signed)
 Transition of Care [facility]) - Initial/Assessment Note   Patient Details  Name: David Mcgrath MRN: [id number] Date of Birth: [birth year]  Transition of [facility]) CM/SW Contact:    Duwaine GORMAN Aran, LCSW Phone Number: 10/16/2023, 2:53 PM  Clinical Narrative: PT evaluation recommended HH. CSW met with patient and spouse, David Mcgrath, to discuss recommendations. Patient and spouse confirmed patient is already active with [facility] for HHPT/OT and plan to resume after discharge. CSW followed up with Shylice with [facility]. HH orders requested.  Expected Discharge Plan: Home w Home Health Services Barriers to Discharge: Continued Medical Work up  Patient Goals and CMS Choice Patient states their goals for this hospitalization and ongoing recovery are:: Resume HHPT/OT with [facility]  Expected Discharge Plan and Services In-house Referral: Clinical Social Work Post Acute Care Choice: Resumption of Paediatric nurse, Home Health Living arrangements for the past 2 months: Single Family Home           DME Arranged: N/A DME Agency: NA HH Agency: [facility] ([facility]) Date HH Agency Contacted: 10/16/23 Time HH Agency Contacted: [id number] Representative spoke with at [facility] Agency: Shylice  Prior Living Arrangements/Services Living arrangements for the past 2 months: Single Family Home Lives with:: Spouse Patient language and need for interpreter reviewed:: Yes Do you feel safe going back to the place where you live?: Yes      Need for Family Participation in Patient Care: No (Comment) Care giver support system in place?: Yes (comment) Current home services: Home OT, Home PT (Active with [facility]) Criminal Activity/Legal Involvement Pertinent to Current Situation/Hospitalization: No - Comment as needed  Activities of Daily Living ADL Screening (condition at time of admission) Independently performs ADLs?: Yes (appropriate for developmental age) Is the patient deaf or have difficulty  hearing?: No Does the patient have difficulty seeing, even when wearing glasses/contacts?: No Does the patient have difficulty concentrating, remembering, or making decisions?: No  Permission Sought/Granted Permission sought to share information with : Other (comment) Permission granted to share information with : Yes, Verbal Permission Granted Permission granted to share info w AGENCY: [facility]  Emotional Assessment Attitude/Demeanor/Rapport: Gracious Affect (typically observed): Accepting Orientation: : Oriented to Self, Oriented to Place, Oriented to  Time, Oriented to Situation Alcohol  / Substance Use: Not Applicable Psych Involvement: No (comment)  Admission diagnosis:  Uncontrollable vomiting [R11.10] Small bowel obstruction (HCC) [K56.609] Coffee ground emesis [K92.0] Patient Active Problem List   Diagnosis Date Noted   Small bowel obstruction with coffee ground emesis 10/14/2023   Hypokalemia 10/14/2023   Leukocytosis 10/14/2023   OSA (obstructive sleep apnea) 10/14/2023   Multiple myeloma (HCC) 08/04/2023   Paroxysmal atrial fibrillation (HCC) 07/04/2023   Neuropathy    Essential hypertension 06/04/2023   Fatty liver 06/04/2023   Hyperlipidemia 06/04/2023   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/04/2018   Depression 02/25/2014   PCP:  [name], DO Pharmacy:   [facility] [place], [place] - [address] [facility] [address] [place] [address] Phone: [phone] Fax: [phone]  [facility] [place], [place] - [address] [place] [address] Phone: [phone] Fax: [phone]  Social Drivers of Health (SDOH) Social History: SDOH Screenings   Food Insecurity: No Food Insecurity (10/14/2023)  Housing: Low Risk  (10/14/2023)  Transportation Needs: No Transportation Needs (10/14/2023)  Utilities: Not At Risk (10/14/2023)  Depression (PHQ2-9): Low Risk  (10/12/2023)  Social Connections:  Socially Integrated (10/14/2023)  Tobacco Use: Low  Risk  (09/04/2023)   SDOH Interventions:    Readmission Risk Interventions    10/15/2023   11:45 AM  Readmission Risk Prevention Plan  Transportation Screening Complete  PCP or Specialist Appt within 3-5 Days Complete  HRI or Home Care Consult Complete  Social Work Consult for Recovery Care Planning/Counseling Complete  Palliative Care Screening Not Applicable  Medication Review Oceanographer) Complete

## 2023-10-16 NOTE — Plan of Care (Signed)
 Low IS to NGT Ice chips Passing gas IVFs dc  Morphine  for pain Problem: Clinical Measurements: Goal: Ability to maintain clinical measurements within normal limits will improve Outcome: Progressing   Problem: Clinical Measurements: Goal: Will remain free from infection Outcome: Progressing   Problem: Clinical Measurements: Goal: Diagnostic test results will improve Outcome: Progressing

## 2023-10-17 ENCOUNTER — Inpatient Hospital Stay (HOSPITAL_COMMUNITY)

## 2023-10-17 ENCOUNTER — Encounter (HOSPITAL_COMMUNITY): Payer: Self-pay | Admitting: Family Medicine

## 2023-10-17 DIAGNOSIS — K56609 Unspecified intestinal obstruction, unspecified as to partial versus complete obstruction: Secondary | ICD-10-CM | POA: Diagnosis not present

## 2023-10-17 LAB — BASIC METABOLIC PANEL WITH GFR
Anion gap: 12 (ref 5–15)
BUN: 17 mg/dL (ref 8–23)
CO2: 23 mmol/L (ref 22–32)
Calcium: 6.9 mg/dL — ABNORMAL LOW (ref 8.9–10.3)
Chloride: 99 mmol/L (ref 98–111)
Creatinine, Ser: 0.71 mg/dL (ref 0.61–1.24)
GFR, Estimated: >60 mL/min (ref >60)
Glucose, Bld: 63 mg/dL — ABNORMAL LOW (ref 70–99)
Potassium: 3.1 mmol/L — ABNORMAL LOW (ref 3.5–5.1)
Sodium: 134 mmol/L — ABNORMAL LOW (ref 135–145)

## 2023-10-17 LAB — CBC WITH DIFFERENTIAL/PLATELET
Abs Immature Granulocytes: 0.05 K/uL (ref 0.00–0.07)
Basophils Absolute: 0 K/uL (ref 0.0–0.1)
Basophils Relative: 0 %
Eosinophils Absolute: 0.2 K/uL (ref 0.0–0.5)
Eosinophils Relative: 2 %
HCT: 29.9 % — ABNORMAL LOW (ref 39.0–52.0)
Hemoglobin: 9.7 g/dL — ABNORMAL LOW (ref 13.0–17.0)
Immature Granulocytes: 1 %
Lymphocytes Relative: 5 %
Lymphs Abs: 0.6 K/uL — ABNORMAL LOW (ref 0.7–4.0)
MCH: 32.3 pg (ref 26.0–34.0)
MCHC: 32.4 g/dL (ref 30.0–36.0)
MCV: 99.7 fL (ref 80.0–100.0)
Monocytes Absolute: 0.8 K/uL (ref 0.1–1.0)
Monocytes Relative: 8 %
Neutro Abs: 9.1 K/uL — ABNORMAL HIGH (ref 1.7–7.7)
Neutrophils Relative %: 84 %
Platelets: 167 K/uL (ref 150–400)
RBC: 3 MIL/uL — ABNORMAL LOW (ref 4.22–5.81)
RDW: 16 % — ABNORMAL HIGH (ref 11.5–15.5)
WBC: 10.7 K/uL — ABNORMAL HIGH (ref 4.0–10.5)
nRBC: 0 % (ref 0.0–0.2)

## 2023-10-17 LAB — PHOSPHORUS: Phosphorus: 1.8 mg/dL — ABNORMAL LOW (ref 2.5–4.6)

## 2023-10-17 LAB — MAGNESIUM: Magnesium: 2.1 mg/dL (ref 1.7–2.4)

## 2023-10-17 MED ORDER — ACETAMINOPHEN 10 MG/ML IV SOLN
1000.0000 mg | Freq: Once | INTRAVENOUS | Status: AC
  Administered 2023-10-17: 1000 mg via INTRAVENOUS
  Filled 2023-10-17: qty 100

## 2023-10-17 MED ORDER — SODIUM PHOSPHATES 45 MMOLE/15ML IV SOLN
30.0000 mmol | Freq: Once | INTRAVENOUS | Status: AC
  Administered 2023-10-17: 30 mmol via INTRAVENOUS
  Filled 2023-10-17: qty 10

## 2023-10-17 MED ORDER — DEXTROSE-SODIUM CHLORIDE 5-0.9 % IV SOLN: INTRAVENOUS | Status: AC

## 2023-10-17 NOTE — Progress Notes (Signed)
 Pt had small, mushy, paste like BM, light brown, lots of gas & burping

## 2023-10-17 NOTE — Progress Notes (Signed)
 PROGRESS NOTE  David Mcgrath  DOB: 08/06/1950  PCP: [name], DO FMW:[id number]  DOA: 10/14/2023  LOS: 3 days  Hospital Day: 4  Brief narrative: David Mcgrath is a 73 y.o. male with PMH significant for HTN, HLD, paroxysmal A-fib, neuropathy, fatty liver, recent diagnosis of multiple myeloma 06/2023 currently on chemotherapy with Velcade  infusion and oral  Revlimid .  Last treatment was on 10/12/2023, cycle 3. Posttreatment, did not feel good.  Due to diarrhea resumed Revlimid  at a lower dose only.  Continue to have nausea, vomiting, 1 episode of coffee-ground content 7/12, 2 days after the last treatment, patient presented to the ED with complaint of intractable nausea vomiting, coffee-ground emesis.  In the ED, patient is afebrile, blood pressure in low normal range, breathing on room air. Labs with WC count 16.2, hemoglobin 10.8, potassium 3.3 FOBT positive Urinalysis showed cloudy yellow urine with small hemoglobin, large leukocytes  CT angio abdomen pelvis showed  1. No c evidence of GI bleeding 2. Progressive small bowel distension with suspected transition point in the right hemiabdomen, suspicious for small bowel obstruction. 3. Question of internal hernia with transverse colon coursing posterior to the stomach, but no inflammation or obstructive change at this site. 4. Small volume ascites, Generalized body wall edema/anasarca. 5. Dependent ground-glass opacities in the lung bases may represent hypoventilatory change or infection. Aspiration is also considered. 6. Known lytic lesions, without significant change.  Patient was given IV fluid, IV Protonix , IV Zofran , IV Reglan  Admitted to [facility] General Surgery was consulted   Subjective: Patient was seen and examined this morning.  Lying down in bed.  Feels better.  Had bowel movement this morning.  Passing gas. Wife at bedside. Plan from general surgery noted to try NG tube clamping and clear  liquid Chart reviewed Hemodynamically stable. Labs from this morning with potassium low at 3.1, hemoglobin 9.7, blood glucose level this morning was low at 63.  Started on D5 NS at 77 mL/h Potassium replacement ordered  Assessment and plan: Small bowel obstruction  Presented with nausea, vomiting  Imagings as above raising suspicion of SBO versus ileus General surgery consulted Started on conservative management with NG tube insertion, bowel rest 7/13, underwent SBO protocol. Bowel function returned.  Had bowel movement today.  Noted a plan from general surgery to try NG tube clamping and clear liquid today.  No acute upper GI bleeding  Mentioned coffee ground emesis, Hemoccult positive NG tube showed coffee-ground color. CT angio abdomen pelvis did not show any source of bleeding Patient could have had esophageal mucosal tear due to intense retching Eliquis  was held, last dose was on the morning of 7/12. Hemoglobin remains stable Currently on IV Protonix  twice daily Follows up with GI Dr. Dianna as an outpatient.  Hemoglobin gets monitored at cancer center as well. Recent Labs    03/14/23 0909 04/25/23 1627 07/13/23 1351 07/31/23 0748 10/14/23 1801 10/14/23 2233 10/15/23 0415 10/16/23 0248 10/17/23 0435  HGB  --    < >  --    < > 10.8* 10.0* 9.5* 9.2* 9.7*  MCV  --    < >  --    < > 97.1 96.5 96.3 97.5 99.7  VITAMINB12 850  --  992*  --   --   --   --   --   --   FOLATE  --   --  18.3  --   --   --   --   --   --  TIBC  --   --  241*  --   --   --   --   --   --   IRON  --   --  70  --   --   --   --   --   --    < > = values in this interval not displayed.   Hypokalemia/hypomagnesemia/hypophosphatemia Potassium low at 3.1 today.  Replacement given. Phosphorus level low at 1.8.  IV sodium phosphate  ordered. Recent Labs  Lab 10/12/23 1440 10/14/23 1406 10/14/23 1801 10/15/23 0415 10/16/23 0248 10/17/23 0435  K 4.1 3.3*  --  3.0* 2.8* 3.1*  MG  --   --  1.5*  2.0  --  2.1  PHOS  --   --   --   --   --  1.8*   Multiple myeloma IgG Kappa MM, diagnosed in march of 2025 Followed by Dr.Dorsey  Was on Velcade  plus Dexamethasone  and Revlimid , last infusion 10/12/2023. Day 15, cycle 3  Was also on Zyvox  twice daily.  Continue the same  Leukocytosis Likely inflammatory as well as with her use of dexamethasone  recently. No evidence of infection Recent Labs  Lab 10/14/23 1801 10/14/23 1802 10/14/23 1947 10/14/23 2233 10/15/23 0415 10/16/23 0248 10/17/23 0435  WBC 13.3*  --   --  14.1* 13.2* 9.7 10.7*  LATICACIDVEN  --  1.9 1.7  --   --   --   --    Paroxysmal atrial fibrillation  Not on rate controlling agents  Chronically anticoagulated with Eliquis , plan as above Follows up with Dr. Ladona   Neuropathy Per report, EMG nerve conduction study done on 01/30/2020 by Dr. Margaret confirmed severe axonal sensorimotor polyneuropathy as well as moderate right carpal tunnel.  Continue gabapentin  600 mg TID when able to   Essential hypertension Maintained normotensive readings since significant weight loss PTA meds-none   Depression Resume prozac  daily when able to   Hyperlipidemia Resume pravachol and zetia when able to   OSA (obstructive sleep apnea) Oxygen at night until NG tube removed and then cpap     Mobility: Seen by PT.  Home health PT recommended.  Goals of care   Code Status: Full Code     DVT prophylaxis:  heparin  injection 5,000 Units Start: 10/16/23 1400 SCDs Start: 10/14/23 1729   Antimicrobials: None Fluid: Currently on NS at 75 mL/h Consultants: General Surgery Family Communication: None at bedside  Status: Inpatient Level of care:  Progressive   Patient is from: Home Needs to continue in-hospital care: Bowel obstruction resolving Anticipated d/c to: Today versus tomorrow depending on tolerance of oral intake.    Diet:  Diet Order             Diet clear liquid Room service appropriate? Yes; Fluid  consistency: Thin  Diet effective now                   Scheduled Meds:  heparin  injection (subcutaneous)  5,000 Units Subcutaneous Q8H   pantoprazole  (PROTONIX ) IV  40 mg Intravenous Q12H    PRN meds: acetaminophen  **OR** acetaminophen , menthol -cetylpyridinium, metoprolol  tartrate, morphine  injection, ondansetron  **OR** ondansetron  (ZOFRAN ) IV, phenol, sodium chloride  flush   Infusions:   dextrose  5 % and 0.9 % NaCl 125 mL/hr at 10/17/23 0929   sodium phosphate  30 mmol in sodium chloride  0.9 % 250 mL infusion 30 mmol (10/17/23 0929)     Antimicrobials: Anti-infectives (From admission, onward)    None  Objective: Vitals:   10/16/23 1927 10/17/23 0402  BP: 131/73 127/72  Pulse: 79 83  Resp: 19 16  Temp:    SpO2: 98% 100%    Intake/Output Summary (Last 24 hours) at 10/17/2023 1148 Last data filed at 10/17/2023 0911 Gross per 24 hour  Intake 803.65 ml  Output 1775 ml  Net -971.35 ml   Filed Weights   10/14/23 1229  Weight: 102 kg   Weight change:  Body mass index is 27.37 kg/m.   Physical Exam: General exam: Pleasant, elderly Caucasian male.  Skin: No rashes, lesions or ulcers. HEENT: Atraumatic, normocephalic, no obvious bleeding Lungs: Clear to auscultation bilaterally,  CVS: S1, S2, no murmur,   GI/Abd: Soft, nontender, distended, bowel sound sluggish, NG tube clamped CNS: Alert, awake, oriented x 3 Psychiatry: Mood appropriate Extremities: No pedal edema, no calf tenderness,   Data Review: I have personally reviewed the laboratory data and studies available.  F/u labs  Unresulted Labs (From admission, onward)     Start     Ordered   10/16/23 0500  CBC with Differential/Platelet  Daily,   R      10/15/23 1140   10/16/23 0500  Basic metabolic panel with GFR  Daily,   R      10/15/23 1140            Signed, David Rota, MD [facility] 10/17/2023

## 2023-10-17 NOTE — Progress Notes (Signed)
 Overnight surgery & [facility] contacted re: low UOP, pt feeling dehydrated & concerns about agitation & morphine   See eMAR

## 2023-10-17 NOTE — Plan of Care (Signed)
  Problem: Education: Goal: Knowledge of General Education information will improve Description: Including pain rating scale, medication(s)/side effects and non-pharmacologic comfort measures Outcome: Progressing   Problem: Health Behavior/Discharge Planning: Goal: Ability to manage health-related needs will improve Outcome: Progressing   Problem: Activity: Goal: Risk for activity intolerance will decrease Outcome: Progressing   Problem: Nutrition: Goal: Adequate nutrition will be maintained Outcome: Progressing   Problem: Coping: Goal: Level of anxiety will decrease Outcome: Progressing   Problem: Elimination: Goal: Will not experience complications related to bowel motility Outcome: Progressing Goal: Will not experience complications related to urinary retention Outcome: Progressing   Problem: Pain Managment: Goal: General experience of comfort will improve and/or be controlled Outcome: Progressing

## 2023-10-17 NOTE — Progress Notes (Signed)
 Check on patient with NGT clamped.  Feels well and has had another BM, but residual checked with over 700cc of bilious output and still draining.  Will return NGT to [facility] for tonight.  Unable to give Reglan  as he had some shaking and neuro effects when he was given it in the ED upon arrival.  Continue to monitor.  Burnard FORBES Banter 2:28 PM  10/17/2023

## 2023-10-17 NOTE — Progress Notes (Signed)
 Subjective: Bowel movement this morning feeling much better.  Wants his NG tube out and wants to go home to watch a baseball game tonight.  ROS: See above, otherwise other systems negative  Objective: Vital signs in last 24 hours: Temp:  [97.7 F (36.5 C)] 97.7 F (36.5 C) (07/14 1926) Pulse Rate:  [79-83] 83 (07/15 0402) Resp:  [16-20] 16 (07/15 0402) BP: (127-131)/(72-73) 127/72 (07/15 0402) SpO2:  [98 %-100 %] 100 % (07/15 0402) Last BM Date : 10/17/23  Intake/Output from previous day: 07/14 0701 - 07/15 0700 In: 903.7 [P.O.:460; IV Piggyback:443.7] Out: 2425 [Urine:675; Emesis/NG output:1750] Intake/Output this shift: Total I/O In: -  Out: 250 [Urine:250]  PE: Gen: NAD Abd: soft, not really tender, NGT with high bilious output  Lab Results:  Recent Labs    10/16/23 0248 10/17/23 0435  WBC 9.7 10.7*  HGB 9.2* 9.7*  HCT 27.8* 29.9*  PLT 150 167   BMET Recent Labs    10/16/23 0248 10/17/23 0435  NA 136 134*  K 2.8* 3.1*  CL 98 99  CO2 28 23  GLUCOSE 72 63*  BUN 23 17  CREATININE 0.72 0.71  CALCIUM  7.0* 6.9*   PT/INR Recent Labs    10/14/23 1406  LABPROT 17.5*  INR 1.4*   CMP     Component Value Date/Time   NA 134 (L) 10/17/2023 0435   NA 141 08/02/2023 0942   K 3.1 (L) 10/17/2023 0435   CL 99 10/17/2023 0435   CO2 23 10/17/2023 0435   GLUCOSE 63 (L) 10/17/2023 0435   BUN 17 10/17/2023 0435   BUN 7 (L) 08/02/2023 0942   CREATININE 0.71 10/17/2023 0435   CREATININE 0.67 10/12/2023 1440   CALCIUM  6.9 (L) 10/17/2023 0435   PROT 5.4 (L) 10/14/2023 1406   PROT 7.0 01/09/2020 1445   ALBUMIN 2.7 (L) 10/14/2023 1406   AST 33 10/14/2023 1406   AST 37 10/12/2023 1440   ALT 33 10/14/2023 1406   ALT 35 10/12/2023 1440   ALKPHOS 45 10/14/2023 1406   BILITOT 0.4 10/14/2023 1406   BILITOT 0.3 10/12/2023 1440   GFRNONAA >60 10/17/2023 0435   GFRNONAA >60 10/12/2023 1440   Lipase  No results found for:  LIPASE     Studies/Results: DG Abd Portable 1V Result Date: 10/17/2023 CLINICAL DATA:  73 year old male with suspected small-bowel obstruction on CTA abdomen and pelvis 3 days ago. EXAM: PORTABLE ABDOMEN - 1 VIEW COMPARISON:  10/16/2023 radiographs and earlier. FINDINGS: Portable AP supine view at 0618 hours. Retained stool in the rectum has increased since the prior CT. Bowel-gas pattern otherwise not significantly changed since that time. Ileus versus small-bowel obstruction. No pneumoperitoneum identified on these supine views. Stable visualized osseous structures. IMPRESSION: Stable gas pattern suggesting ileus versus small-bowel obstruction. Increased retained stool in the rectum from the recent CT. Electronically Signed   By: VEAR Hurst M.D.   On: 10/17/2023 10:10   DG Abd Portable 1V Result Date: 10/16/2023 CLINICAL DATA:  Small-bowel obstruction EXAM: PORTABLE ABDOMEN - 1 VIEW COMPARISON:  Abdominal radiograph dated 10/15/2023 FINDINGS: Partially imaged enteric tube tip projects over the left upper quadrant, likely within the stomach. Similar diffuse gas-filled bowel dilation throughout the abdomen. Gas is seen at the level of the rectum. IMPRESSION: Similar diffuse gas-filled bowel dilation throughout the abdomen, which may represent small-bowel obstruction or ileus. Electronically Signed   By: Limin  Xu M.D.   On: 10/16/2023 10:55   DG Abd Portable  1V-Small Bowel Obstruction Protocol-initial, 8 hr delay Result Date: 10/15/2023 CLINICAL DATA:  Small-bowel obstruction-8 hour delay EXAM: PORTABLE ABDOMEN - 1 VIEW COMPARISON:  CT abdomen pelvis 10/14/2023 FINDINGS: Persistent dilation of the small bowel. Contrast is not definitively visualized the on the stomach. Contrast in the bladder from prior CT. IMPRESSION: Persistent small bowel obstruction. Electronically Signed   By: Norman Gatlin M.D.   On: 10/15/2023 18:39    Anti-infectives: Anti-infectives (From admission, onward)    None         Assessment/Plan SBO  -significant NGT output, but having bowel movements and very much wanting his NG tube out - We will try clamp trial today and allow clear liquids around the tube.  If he does well with the tube clamped throughout the day could consider removing the tube this afternoon.  FEN -NG tube clamp trial with clear liquid diet IVFs VTE - ok for prophylaxis from our standpoint ID - none currently needed  MM - on oral chemo PAF HTN Depression HLD OSA  I reviewed hospitalist notes, last 24 h vitals and pain scores, last 48 h intake and output, last 24 h labs and trends, and last 24 h imaging results.   LOS: 3 days    Deward JINNY Foy , MD [facility] 10/17/2023, 10:23 AM Please see Amion for pager number during day hours 7:00am-4:30pm or 7:00am -11:30am on weekends

## 2023-10-18 ENCOUNTER — Inpatient Hospital Stay (HOSPITAL_COMMUNITY)

## 2023-10-18 DIAGNOSIS — K56609 Unspecified intestinal obstruction, unspecified as to partial versus complete obstruction: Secondary | ICD-10-CM | POA: Diagnosis not present

## 2023-10-18 LAB — BASIC METABOLIC PANEL WITH GFR
Anion gap: 12 (ref 5–15)
BUN: 11 mg/dL (ref 8–23)
CO2: 23 mmol/L (ref 22–32)
Calcium: 6.7 mg/dL — ABNORMAL LOW (ref 8.9–10.3)
Chloride: 101 mmol/L (ref 98–111)
Creatinine, Ser: 0.7 mg/dL (ref 0.61–1.24)
GFR, Estimated: >60 mL/min (ref >60)
Glucose, Bld: 124 mg/dL — ABNORMAL HIGH (ref 70–99)
Potassium: 2.5 mmol/L — CL (ref 3.5–5.1)
Sodium: 136 mmol/L (ref 135–145)

## 2023-10-18 LAB — CBC WITH DIFFERENTIAL/PLATELET
Abs Immature Granulocytes: 0.05 K/uL (ref 0.00–0.07)
Basophils Absolute: 0 K/uL (ref 0.0–0.1)
Basophils Relative: 0 %
Eosinophils Absolute: 0.2 K/uL (ref 0.0–0.5)
Eosinophils Relative: 1 %
HCT: 28.3 % — ABNORMAL LOW (ref 39.0–52.0)
Hemoglobin: 9.4 g/dL — ABNORMAL LOW (ref 13.0–17.0)
Immature Granulocytes: 0 %
Lymphocytes Relative: 6 %
Lymphs Abs: 0.7 K/uL (ref 0.7–4.0)
MCH: 31.8 pg (ref 26.0–34.0)
MCHC: 33.2 g/dL (ref 30.0–36.0)
MCV: 95.6 fL (ref 80.0–100.0)
Monocytes Absolute: 0.9 K/uL (ref 0.1–1.0)
Monocytes Relative: 8 %
Neutro Abs: 10.1 K/uL — ABNORMAL HIGH (ref 1.7–7.7)
Neutrophils Relative %: 85 %
Platelets: 169 K/uL (ref 150–400)
RBC: 2.96 MIL/uL — ABNORMAL LOW (ref 4.22–5.81)
RDW: 15.9 % — ABNORMAL HIGH (ref 11.5–15.5)
WBC: 11.9 K/uL — ABNORMAL HIGH (ref 4.0–10.5)
nRBC: 0 % (ref 0.0–0.2)

## 2023-10-18 MED ORDER — ACETAMINOPHEN 10 MG/ML IV SOLN
1000.0000 mg | Freq: Once | INTRAVENOUS | Status: AC
  Administered 2023-10-18: 1000 mg via INTRAVENOUS
  Filled 2023-10-18: qty 100

## 2023-10-18 MED ORDER — CALCIUM GLUCONATE-NACL 1-0.675 GM/50ML-% IV SOLN
1.0000 g | Freq: Once | INTRAVENOUS | Status: AC
  Administered 2023-10-18: 1000 mg via INTRAVENOUS
  Filled 2023-10-18: qty 50

## 2023-10-18 MED ORDER — POTASSIUM CHLORIDE 20 MEQ PO PACK
40.0000 meq | PACK | Freq: Once | ORAL | Status: AC
  Administered 2023-10-18: 40 meq via ORAL
  Filled 2023-10-18: qty 2

## 2023-10-18 MED ORDER — HEPARIN (PORCINE) 25000 UT/250ML-% IV SOLN
1800.0000 [IU]/h | INTRAVENOUS | Status: DC
  Administered 2023-10-18: 1400 [IU]/h via INTRAVENOUS
  Administered 2023-10-19 – 2023-10-20 (×2): 1600 [IU]/h via INTRAVENOUS
  Filled 2023-10-18 (×3): qty 250

## 2023-10-18 MED ORDER — POTASSIUM CHLORIDE 10 MEQ/100ML IV SOLN
10.0000 meq | INTRAVENOUS | Status: AC
  Administered 2023-10-18 (×6): 10 meq via INTRAVENOUS
  Filled 2023-10-18 (×6): qty 100

## 2023-10-18 MED ORDER — DEXTROSE-SODIUM CHLORIDE 5-0.9 % IV SOLN: INTRAVENOUS | Status: AC

## 2023-10-18 NOTE — Progress Notes (Signed)
 PHARMACY - ANTICOAGULATION CONSULT NOTE  Pharmacy Consult for IV UFH (PTA apixaban ) Indication: atrial fibrillation  Allergies  Allergen Reactions   Shrimp [Shellfish Allergy] Anaphylaxis and Swelling    Swelling throat     Patient Measurements: Height: 6' 4 (193 cm) Weight: 102 kg (224 lb 13.9 oz) IBW/kg (Calculated) : 86.8 HEPARIN  DW (KG): 102  Vital Signs: Temp: 98.2 F (36.8 C) (07/16 1219) Temp Source: Oral (07/16 1219) BP: 135/81 (07/16 1219) Pulse Rate: 76 (07/16 1219)  Labs: Recent Labs    10/16/23 0248 10/17/23 0435 10/18/23 0330  HGB 9.2* 9.7* 9.4*  HCT 27.8* 29.9* 28.3*  PLT 150 167 169  CREATININE 0.72 0.71 0.70    Estimated Creatinine Clearance: 102.5 mL/min (by C-G formula based on SCr of 0.7 mg/dL).   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  Patient reports he does not have symptoms as of 02/25/14.     Diarrhea 06/04/2023   Essential hypertension 06/04/2023   Fatty liver 06/04/2023   Flu 06/04/2023   Hyperlipidemia 06/04/2023   Ileus (HCC) 06/04/2023   Neuropathy    Paroxysmal atrial fibrillation (HCC) 07/04/2023   Pneumonia 06/04/2023    Medications:  Scheduled:   pantoprazole  (PROTONIX ) IV  40 mg Intravenous Q12H   Infusions:   dextrose  5 % and 0.9 % NaCl 75 mL/hr at 10/18/23 1327    Assessment: 73 yo with bowel obstruction on apixaban  PTA for Afib hx. Has been on hold since admission started on SQ heparin  for DVT ppx. Now Md electing to change to IV heparin  with no bolus until safe to resume apixaban  when patient can tolerate PO again.   Goal of Therapy:  Heparin  level 0.3-0.7 units/ml Monitor platelets by anticoagulation protocol: Yes   Plan:  NO IV heparin  bolus  Start IV heparin  at rate of 1400  units/hr Check heparin  level in 8 hours Daily CBC  Britta Eva Na 10/18/2023,4:07 PM

## 2023-10-18 NOTE — Significant Event (Signed)
 Patient had an episode of RVR this afternoon when his heart rate went up to 120s-140s on ambulation. He has known history of persistent A-fib but has been rate controlled without any meds so far. He was also on Eliquis  at home which has been on hold since admission in anticipation of potential need of surgical intervention for bowel obstruction.  Seen and examined this afternoon again Sitting up in recliner.  Not in distress.  Does not feel palpitation. Heart rate in 80s, rhythm A-fib On D5 NS at 75 mL/h  So far, I had tried to avoid heparin  drip with the thought that bowel obstruction would likely improve with conservative measures in 2 to 3 days.  Also, there was mention of coffee-ground emesis on admission and his hemoglobin had dropped.  Hemoglobin has now remained stable for few days.  Bowel obstruction is taking longer than expected to resolve. I will initiate heparin  drip without bolus while off Eliquis . Communicated with bedside RN

## 2023-10-18 NOTE — Progress Notes (Signed)
 PROGRESS NOTE  David Mcgrath  DOB: 1950-10-29  PCP: David Skates, DO FMW:[id number]  DOA: 10/14/2023  LOS: 4 days  Hospital Day: 5  Brief narrative: David Mcgrath is a 73 y.o. male with PMH significant for HTN, HLD, paroxysmal A-fib, neuropathy, fatty liver, recent diagnosis of multiple myeloma 06/2023 currently on chemotherapy with Velcade  infusion and oral  Revlimid .  Last treatment was on 10/12/2023, cycle 3. Posttreatment, did not feel good.  Due to diarrhea resumed Revlimid  at a lower dose only.  Continue to have nausea, vomiting, 1 episode of coffee-ground content 7/12, 2 days after the last treatment, patient presented to the ED with complaint of intractable nausea vomiting, coffee-ground emesis.  In the ED, patient is afebrile, blood pressure in low normal range, breathing on room air. Labs with WC count 16.2, hemoglobin 10.8, potassium 3.3 FOBT positive Urinalysis showed cloudy yellow urine with small hemoglobin, large leukocytes  CT angio abdomen pelvis showed  1. No c evidence of GI bleeding 2. Progressive small bowel distension with suspected transition point in the right hemiabdomen, suspicious for small bowel obstruction. 3. Question of internal hernia with transverse colon coursing posterior to the stomach, but no inflammation or obstructive change at this site. 4. Small volume ascites, Generalized body wall edema/anasarca. 5. Dependent ground-glass opacities in the lung bases may represent hypoventilatory change or infection. Aspiration is also considered. 6. Known lytic lesions, without significant change.  Patient was given IV fluid, IV Protonix , IV Zofran , IV Reglan  Admitted to [facility] General Surgery was consulted See below for details   Subjective: Patient was seen and examined this morning. Afebrile, hemodynamically stable Despite having return of bowel function yesterday, patient had about 700 cc of bilious output and his NG tube was returned  to LIWS. General surgery follow-up this morning appreciated.  NG was pulled out and started on clear liquid Later this morning, had a solid bowel movement Labs on this morning with potassium low at 2.5, calcium  low at 6.7, WBC 11.9, hemoglobin 9.4  Assessment and plan: Small bowel obstruction  Presented with nausea, vomiting  Imagings as above raising suspicion of SBO versus ileus General surgery consulted Started on conservative management with NG tube insertion, bowel rest 7/13, underwent SBO protocol. Bowel function returned.  Had bowel movement yesterday but also had residual NG tube and hence it was kept attached to suction.  NGT removed today.  Started on clear liquid diet.  Continue to monitor.  Acute upper GI bleeding  Chronic anemia Mentioned coffee ground emesis, Hemoccult positive NG tube showed coffee-ground color. CT angio abdomen pelvis did not show any source of bleeding Patient could have had esophageal mucosal tear due to intense retching Eliquis  was held, last dose was on the morning of 7/12. Hemoglobin remains stable Currently on IV Protonix  twice daily Follows up with GI Dr. Dianna as an outpatient.  Hemoglobin gets monitored at cancer center as well. Recent Labs    03/14/23 0909 04/25/23 1627 07/13/23 1351 07/31/23 0748 10/14/23 2233 10/15/23 0415 10/16/23 0248 10/17/23 0435 10/18/23 0330  HGB  --    < >  --    < > 10.0* 9.5* 9.2* 9.7* 9.4*  MCV  --    < >  --    < > 96.5 96.3 97.5 99.7 95.6  VITAMINB12 850  --  992*  --   --   --   --   --   --   FOLATE  --   --  18.3  --   --   --   --   --   --  TIBC  --   --  241*  --   --   --   --   --   --   IRON  --   --  70  --   --   --   --   --   --    < > = values in this interval not displayed.   Hypokalemia/hypomagnesemia/hypophosphatemia Potassium low at 2.5 today.  Aggressive IV and oral replacement ordered. Phosphorus was replaced yesterday Recent Labs  Lab 10/14/23 1406 10/14/23 1801  10/15/23 0415 10/16/23 0248 10/17/23 0435 10/18/23 0330  K 3.3*  --  3.0* 2.8* 3.1* 2.5*  MG  --  1.5* 2.0  --  2.1  --   PHOS  --   --   --   --  1.8*  --    Hypocalcemia Calcium  level low at 6.7.  1 dose of IV calcium  gluconate given. Recent Labs  Lab 10/12/23 1440 10/14/23 1406 10/15/23 0415 10/16/23 0248 10/17/23 0435 10/18/23 0330  CALCIUM  8.6* 8.6* 7.7* 7.0* 6.9* 6.7*    Multiple myeloma IgG Kappa MM, diagnosed in march of 2025 Followed by Dr.Dorsey  Was on Velcade  plus Dexamethasone  and Revlimid , last infusion 10/12/2023. Day 15, cycle 3  Was also on Zyvox  twice daily.  Continue the same  Leukocytosis Likely inflammatory as well as with her use of dexamethasone  recently. No evidence of infection Recent Labs  Lab 10/14/23 1802 10/14/23 1947 10/14/23 2233 10/15/23 0415 10/16/23 0248 10/17/23 0435 10/18/23 0330  WBC  --   --  14.1* 13.2* 9.7 10.7* 11.9*  LATICACIDVEN 1.9 1.7  --   --   --   --   --    Paroxysmal atrial fibrillation  Per RN, heart rate transiently went up to 120s and 140s today on ambulating. He was not on any rate control medications at home.  Ectopy and RVR are likely due to low potassium.  Replacement in process. Continue to monitor for now in telemetry Chronically anticoagulated with Eliquis .currently on hold.  Plan as above Follows up with Dr. Ladona   Neuropathy Per report, EMG nerve conduction study done on 01/30/2020 by Dr. Margaret confirmed severe axonal sensorimotor polyneuropathy as well as moderate right carpal tunnel.  Continue gabapentin  600 mg TID when able to   Essential hypertension Maintained normotensive readings since significant weight loss PTA meds-none   Depression Resume prozac  daily when able to   Hyperlipidemia Resume pravachol and zetia when able to   OSA (obstructive sleep apnea) Oxygen at night until NG tube removed and then cpap     Mobility: Seen by PT.  Home health PT recommended.  Goals of care    Code Status: Full Code     DVT prophylaxis:  heparin  injection 5,000 Units Start: 10/16/23 1400 SCDs Start: 10/14/23 1729   Antimicrobials: None Fluid: Currently on D5NS at 75 mL/h Consultants: General Surgery Family Communication: None at bedside  Status: Inpatient Level of care:  Progressive   Patient is from: Home Needs to continue in-hospital care: Bowel obstruction resolving Anticipated d/c to: Today versus tomorrow depending on tolerance of oral intake.    Diet:  Diet Order             Diet clear liquid Room service appropriate? Yes; Fluid consistency: Thin  Diet effective now                   Scheduled Meds:  heparin  injection (subcutaneous)  5,000 Units Subcutaneous Q8H  pantoprazole  (PROTONIX ) IV  40 mg Intravenous Q12H    PRN meds: acetaminophen  **OR** acetaminophen , menthol -cetylpyridinium, metoprolol  tartrate, morphine  injection, ondansetron  **OR** ondansetron  (ZOFRAN ) IV, phenol, sodium chloride  flush   Infusions:   dextrose  5 % and 0.9 % NaCl        Antimicrobials: Anti-infectives (From admission, onward)    None       Objective: Vitals:   10/18/23 0425 10/18/23 1219  BP: (!) 144/80 135/81  Pulse: 83 76  Resp: 18 16  Temp: 97.7 F (36.5 C) 98.2 F (36.8 C)  SpO2: 100% 99%    Intake/Output Summary (Last 24 hours) at 10/18/2023 1307 Last data filed at 10/18/2023 1000 Gross per 24 hour  Intake 3438.94 ml  Output 3150 ml  Net 288.94 ml   Filed Weights   10/14/23 1229  Weight: 102 kg   Weight change:  Body mass index is 27.37 kg/m.   Physical Exam: General exam: Pleasant, elderly Caucasian male.  Skin: No rashes, lesions or ulcers. HEENT: Atraumatic, normocephalic, no obvious bleeding Lungs: Clear to auscultation bilaterally,  CVS: S1, S2, no murmur,   GI/Abd: Soft, nontender, distended, bowel sound sluggish, NG tube removed CNS: Alert, awake, oriented x 3 Psychiatry: Mood appropriate Extremities: No pedal edema,  no calf tenderness,   Data Review: I have personally reviewed the laboratory data and studies available.  F/u labs  Unresulted Labs (From admission, onward)     Start     Ordered   10/19/23 0500  CBC with Differential/Platelet  Tomorrow morning,   R       Question:  Specimen collection method  Answer:  IV Team=IV Team collect   10/18/23 1307   10/19/23 0500  Comprehensive metabolic panel with GFR  Tomorrow morning,   R       Question:  Specimen collection method  Answer:  IV Team=IV Team collect   10/18/23 1307   10/19/23 0500  Phosphorus  Tomorrow morning,   R       Question:  Specimen collection method  Answer:  IV Team=IV Team collect   10/18/23 1307   10/19/23 0500  Magnesium   Tomorrow morning,   R       Question:  Specimen collection method  Answer:  IV Team=IV Team collect   10/18/23 1307   10/18/23 0825  Hepatic function panel  Add-on,   AD       Question:  Specimen collection method  Answer:  IV Team=IV Team collect   10/18/23 0824   10/18/23 0825  Prealbumin  Add-on,   AD       Question:  Specimen collection method  Answer:  IV Team=IV Team collect   10/18/23 9175            Signed, Chapman Rota, MD [facility] 10/18/2023

## 2023-10-18 NOTE — Progress Notes (Signed)
 Subjective: NG tube output is decreased overnight.  Abdomen is soft nontender.  He is not nauseated.  He is desperate to have the NG tube removed.  He is passing gas and had a bowel movement yesterday.  ROS: See above, otherwise other systems negative  Objective: Vital signs in last 24 hours: Temp:  [97.7 F (36.5 C)-98 F (36.7 C)] 97.7 F (36.5 C) (07/16 0425) Pulse Rate:  [83-87] 83 (07/16 0425) Resp:  [17-18] 18 (07/16 0425) BP: (136-145)/(69-83) 144/80 (07/16 0425) SpO2:  [97 %-100 %] 100 % (07/16 0425) Last BM Date : 10/17/23  Intake/Output from previous day: 07/15 0701 - 07/16 0700 In: 3198.9 [P.O.:240; I.V.:2562.3; IV Piggyback:396.6] Out: 3250 [Urine:700; Emesis/NG output:2550] Intake/Output this shift: No intake/output data recorded.  PE: Gen: NAD Abd: soft, not really tender, NGT with high bilious output  Lab Results:  Recent Labs    10/17/23 0435 10/18/23 0330  WBC 10.7* 11.9*  HGB 9.7* 9.4*  HCT 29.9* 28.3*  PLT 167 169   BMET Recent Labs    10/17/23 0435 10/18/23 0330  NA 134* 136  K 3.1* 2.5*  CL 99 101  CO2 23 23  GLUCOSE 63* 124*  BUN 17 11  CREATININE 0.71 0.70  CALCIUM  6.9* 6.7*   PT/INR No results for input(s): LABPROT, INR in the last 72 hours.  CMP     Component Value Date/Time   NA 136 10/18/2023 0330   NA 141 08/02/2023 0942   K 2.5 (LL) 10/18/2023 0330   CL 101 10/18/2023 0330   CO2 23 10/18/2023 0330   GLUCOSE 124 (H) 10/18/2023 0330   BUN 11 10/18/2023 0330   BUN 7 (L) 08/02/2023 0942   CREATININE 0.70 10/18/2023 0330   CREATININE 0.67 10/12/2023 1440   CALCIUM  6.7 (L) 10/18/2023 0330   PROT 5.4 (L) 10/14/2023 1406   PROT 7.0 01/09/2020 1445   ALBUMIN 2.7 (L) 10/14/2023 1406   AST 33 10/14/2023 1406   AST 37 10/12/2023 1440   ALT 33 10/14/2023 1406   ALT 35 10/12/2023 1440   ALKPHOS 45 10/14/2023 1406   BILITOT 0.4 10/14/2023 1406   BILITOT 0.3 10/12/2023 1440   GFRNONAA >60 10/18/2023 0330    GFRNONAA >60 10/12/2023 1440   Lipase  No results found for: LIPASE     Studies/Results: DG Abd Portable 1V Result Date: 10/18/2023 CLINICAL DATA:  74 year old male with suspected small-bowel obstruction on recent CTA. EXAM: PORTABLE ABDOMEN - 1 VIEW COMPARISON:  Abdominal radiographs yesterday and earlier. FINDINGS: Portable AP supine view at 0752 hours. Enteric tube now is visible in the left upper quadrant, terminating in the stomach. Ongoing gas distended bowel loops in the abdomen and pelvis, most of which appears to be small bowel. Mid abdominal small bowel loops remain dilated approximately 5.5 cm diameter. Bowel-gas pattern has not significantly improved compared to the CTA on 10/14/2023. No pneumoperitoneum is evident on these supine views. Stable visualized osseous structures. IMPRESSION: 1. Enteric tube terminates in the stomach. 2. No improvement in gas distended mostly small bowel loops in the abdomen and pelvis. Pattern currently favors small-bowel obstruction over ileus. Electronically Signed   By: VEAR Hurst M.D.   On: 10/18/2023 08:57   DG Abd Portable 1V Result Date: 10/17/2023 CLINICAL DATA:  73 year old male with suspected small-bowel obstruction on CTA abdomen and pelvis 3 days ago. EXAM: PORTABLE ABDOMEN - 1 VIEW COMPARISON:  10/16/2023 radiographs and earlier. FINDINGS: Portable AP supine view at 0618 hours.  Retained stool in the rectum has increased since the prior CT. Bowel-gas pattern otherwise not significantly changed since that time. Ileus versus small-bowel obstruction. No pneumoperitoneum identified on these supine views. Stable visualized osseous structures. IMPRESSION: Stable gas pattern suggesting ileus versus small-bowel obstruction. Increased retained stool in the rectum from the recent CT. Electronically Signed   By: VEAR Hurst M.D.   On: 10/17/2023 10:10   DG Abd Portable 1V Result Date: 10/16/2023 CLINICAL DATA:  Small-bowel obstruction EXAM: PORTABLE ABDOMEN - 1  VIEW COMPARISON:  Abdominal radiograph dated 10/15/2023 FINDINGS: Partially imaged enteric tube tip projects over the left upper quadrant, likely within the stomach. Similar diffuse gas-filled bowel dilation throughout the abdomen. Gas is seen at the level of the rectum. IMPRESSION: Similar diffuse gas-filled bowel dilation throughout the abdomen, which may represent small-bowel obstruction or ileus. Electronically Signed   By: Limin  Xu M.D.   On: 10/16/2023 10:55    Anti-infectives: Anti-infectives (From admission, onward)    None        Assessment/Plan SBO  -NG output decreased - Remove NG and try clear liquids today, will take slow and monitor closely  FEN - CLD VTE - ok for prophylaxis from our standpoint ID - none currently needed  MM - on oral chemo PAF HTN Depression HLD OSA  I reviewed hospitalist notes, last 24 h vitals and pain scores, last 48 h intake and output, last 24 h labs and trends, and last 24 h imaging results.   LOS: 4 days    Deward JINNY Foy , MD [facility] Surgery 10/18/2023, 9:22 AM Please see Amion for pager number during day hours 7:00am-4:30pm or 7:00am -11:30am on weekends

## 2023-10-18 NOTE — Progress Notes (Signed)
 Physical Therapy Treatment Patient Details Name: David Mcgrath MRN: [id number] DOB: 04/08/1950 Today's Date: 10/18/2023   History of Present Illness 73 y.o. male adm with N/V, 1 episode of coffee ground emesis;  CT = Progressive small bowel distension with suspected transition point in the right hemiabdomen, suspicious for small bowel obstruction.  PMH significant for HTN, HLD, paroxysmal A-fib, neuropathy, fatty liver, recent diagnosis of multiple myeloma 06/2023 currently on chemotherapy    PT Comments   Pt admitted with above diagnosis.  Pt currently with functional limitations due to the deficits listed below (see PT Problem List). Pt in bed when PT arrived. NG tube removed in am. Pt reported concern per repeat nausea and nurse reported providing pt with medication to address nausea earlier in the day. Pt required CGA and increased time with use of hospital bed for supine to sit, CGA and cues for sit to stand  from elevated EOB, pt reported having dizziness prior with standing and positional transitions and stated he needed time, pt required cues, CGA and RW for amb to bathroom 15 feet, pt elected to sit on standard commode with pt's tall stature PT recommends BSC over standard, pt required time to sit to void bowels on commode, A for hygiene and lower body dressing seated on commode, pt reported feeling fatigued and needing to return to sitting on commode before exiting the bathroom, pt required CGA to min A for RW management for gait tasks from bathroom to recliner with cues for proper posture and distance from RW as well as cues for safety and approach to recliner and squaring up with sitting surface with RW and use of B UE support for eccentric control to sitting surface. Pt left in recliner and all needs in place. Nursing staff made PT aware that pt had episodes of abn HR and elevated to 160s with intervention. Pt will benefit from acute skilled PT to increase their independence and safety  with mobility to allow discharge.      If plan is discharge home, recommend the following: Assistance with cooking/housework;Help with stairs or ramp for entrance   Can travel by private vehicle        Equipment Recommendations  None recommended by PT    Recommendations for Other Services       Precautions / Restrictions Precautions Precautions: Fall (monitor HR) Restrictions Weight Bearing Restrictions Per Provider Order: No     Mobility  Bed Mobility Overal bed mobility: Needs Assistance Bed Mobility: Supine to Sit     Supine to sit: Contact guard, HOB elevated     General bed mobility comments: min cues and increased time due to fatigue    Transfers Overall transfer level: Needs assistance Equipment used: Rolling walker (2 wheels) Transfers: Sit to/from Stand Sit to Stand: Contact guard assist           General transfer comment: min cues for safety and RW mangement for bed, standard commode and recliner transfers, pt will require reinforcement for proper RW placement and appoach to sitting surfaces (pt declined for PT to place [facility] over standard commode)    Ambulation/Gait Ambulation/Gait assistance: Contact guard assist Gait Distance (Feet): 18 Feet Assistive device: Rolling walker (2 wheels) Gait Pattern/deviations: Step-to pattern, Trunk flexed Gait velocity: decreased     General Gait Details: cues for proper distance from RW, posture, and encoruagement with short stride length limited foot clerance and slow cadence with trunk flexion and heavy reliance on B UE support at RW due  to fatigue   Stairs             Wheelchair Mobility     Tilt Bed    Modified Rankin (Stroke Patients Only)       Balance Overall balance assessment: Mild deficits observed, not formally tested                                          Communication Communication Communication: No apparent difficulties  Cognition Arousal: Alert Behavior  During Therapy: WFL for tasks assessed/performed   PT - Cognitive impairments: No apparent impairments                         Following commands: Intact      Cueing Cueing Techniques: Verbal cues  Exercises      General Comments        Pertinent Vitals/Pain Pain Assessment Pain Assessment: Faces Faces Pain Scale: Hurts a little bit Pain Location: abdomen Pain Descriptors / Indicators: Sore Pain Intervention(s): Limited activity within patient's tolerance, Monitored during session    Home Living                          Prior Function            PT Goals (current goals can now be found in the care plan section) Acute Rehab PT Goals Patient Stated Goal: get rid of NGT PT Goal Formulation: With patient Time For Goal Achievement: 10/30/23 Potential to Achieve Goals: Good Progress towards PT goals: Progressing toward goals    Frequency    Min 2X/week      PT Plan      Co-evaluation              AM-PAC PT 6 Clicks Mobility   Outcome Measure  Help needed turning from your back to your side while in a flat bed without using bedrails?: A Little Help needed moving from lying on your back to sitting on the side of a flat bed without using bedrails?: A Little Help needed moving to and from a bed to a chair (including a wheelchair)?: A Little Help needed standing up from a chair using your arms (e.g., wheelchair or bedside chair)?: A Little Help needed to walk in hospital room?: A Little Help needed climbing 3-5 steps with a railing? : A Lot 6 Click Score: 17    End of Session Equipment Utilized During Treatment: Gait belt Activity Tolerance: Patient limited by fatigue Patient left: with call bell/phone within reach;with nursing/sitter in room;in chair Nurse Communication: Mobility status PT Visit Diagnosis: Other abnormalities of gait and mobility (R26.89)     Time: 8785-8747 PT Time Calculation (min) (ACUTE ONLY): 38  min  Charges:    $Gait Training: 8-22 mins $Therapeutic Activity: 23-37 mins PT General Charges $$ ACUTE PT VISIT: 1 Visit                     [place], PT Acute Rehab    [place] VEAR Drone 10/18/2023, 1:02 PM

## 2023-10-18 NOTE — Plan of Care (Signed)

## 2023-10-18 NOTE — TOC Transition Note (Signed)
 Transition of Care [facility]) - Discharge Note  Patient Details  Name: David Mcgrath MRN: [id number] Date of Birth: 1950/07/16  Transition of Care [facility]) CM/SW Contact:  Duwaine GORMAN Aran, LCSW Phone Number: 10/18/2023, 11:41 AM  Clinical Narrative: HHPT/OT orders placed by hospitalist. CSW notified Shylice with [facility] of orders. Care management signing off.  Final next level of care: Home w Home Health Services Barriers to Discharge: Barriers Resolved  Patient Goals and CMS Choice Patient states their goals for this hospitalization and ongoing recovery are:: Resume HHPT/OT with [facility]  Discharge Plan and Services Additional resources added to the After Visit Summary for   In-house Referral: Clinical Social Work Post Acute Care Choice: Resumption of Paediatric nurse, Home Health          DME Arranged: N/A DME Agency: NA HH Arranged: PT, OT HH Agency: [facility] Date HH Agency Contacted: 10/16/23 Time HH Agency Contacted: 1446 Representative spoke with at [facility] Agency: Advertising copywriter  Social Drivers of Health (SDOH) Interventions SDOH Screenings   Food Insecurity: No Food Insecurity (10/14/2023)  Housing: Low Risk  (10/14/2023)  Transportation Needs: No Transportation Needs (10/14/2023)  Utilities: Not At Risk (10/14/2023)  Depression (PHQ2-9): Low Risk  (10/12/2023)  Social Connections: Socially Integrated (10/14/2023)  Tobacco Use: Low Risk  (10/17/2023)   Readmission Risk Interventions    10/15/2023   11:45 AM  Readmission Risk Prevention Plan  Transportation Screening Complete  PCP or Specialist Appt within 3-5 Days Complete  HRI or Home Care Consult Complete  Social Work Consult for Recovery Care Planning/Counseling Complete  Palliative Care Screening Not Applicable  Medication Review Oceanographer) Complete

## 2023-10-19 ENCOUNTER — Inpatient Hospital Stay

## 2023-10-19 ENCOUNTER — Inpatient Hospital Stay: Admitting: Hematology and Oncology

## 2023-10-19 DIAGNOSIS — K56609 Unspecified intestinal obstruction, unspecified as to partial versus complete obstruction: Secondary | ICD-10-CM | POA: Diagnosis not present

## 2023-10-19 LAB — CBC WITH DIFFERENTIAL/PLATELET
Abs Immature Granulocytes: 0.08 K/uL — ABNORMAL HIGH (ref 0.00–0.07)
Basophils Absolute: 0 K/uL (ref 0.0–0.1)
Basophils Relative: 0 %
Eosinophils Absolute: 0.2 K/uL (ref 0.0–0.5)
Eosinophils Relative: 2 %
HCT: 30.9 % — ABNORMAL LOW (ref 39.0–52.0)
Hemoglobin: 10.1 g/dL — ABNORMAL LOW (ref 13.0–17.0)
Immature Granulocytes: 1 %
Lymphocytes Relative: 5 %
Lymphs Abs: 0.8 K/uL (ref 0.7–4.0)
MCH: 31.7 pg (ref 26.0–34.0)
MCHC: 32.7 g/dL (ref 30.0–36.0)
MCV: 96.9 fL (ref 80.0–100.0)
Monocytes Absolute: 1 K/uL (ref 0.1–1.0)
Monocytes Relative: 7 %
Neutro Abs: 13.5 K/uL — ABNORMAL HIGH (ref 1.7–7.7)
Neutrophils Relative %: 85 %
Platelets: 213 K/uL (ref 150–400)
RBC: 3.19 MIL/uL — ABNORMAL LOW (ref 4.22–5.81)
RDW: 15.9 % — ABNORMAL HIGH (ref 11.5–15.5)
WBC: 15.6 K/uL — ABNORMAL HIGH (ref 4.0–10.5)
nRBC: 0 % (ref 0.0–0.2)

## 2023-10-19 LAB — HEPARIN LEVEL (UNFRACTIONATED)
Heparin Unfractionated: 0.22 [IU]/mL — ABNORMAL LOW (ref 0.30–0.70)
Heparin Unfractionated: 0.46 [IU]/mL (ref 0.30–0.70)

## 2023-10-19 LAB — PREALBUMIN: Prealbumin: 7 mg/dL — ABNORMAL LOW (ref 18–38)

## 2023-10-19 LAB — COMPREHENSIVE METABOLIC PANEL WITH GFR
ALT: 26 U/L (ref 0–44)
AST: 22 U/L (ref 15–41)
Albumin: 2.4 g/dL — ABNORMAL LOW (ref 3.5–5.0)
Alkaline Phosphatase: 51 U/L (ref 38–126)
Anion gap: 8 (ref 5–15)
BUN: 10 mg/dL (ref 8–23)
CO2: 22 mmol/L (ref 22–32)
Calcium: 7 mg/dL — ABNORMAL LOW (ref 8.9–10.3)
Chloride: 106 mmol/L (ref 98–111)
Creatinine, Ser: 0.62 mg/dL (ref 0.61–1.24)
GFR, Estimated: >60 mL/min (ref >60)
Glucose, Bld: 113 mg/dL — ABNORMAL HIGH (ref 70–99)
Potassium: 3.1 mmol/L — ABNORMAL LOW (ref 3.5–5.1)
Sodium: 136 mmol/L (ref 135–145)
Total Bilirubin: 0.8 mg/dL (ref 0.0–1.2)
Total Protein: 5.3 g/dL — ABNORMAL LOW (ref 6.5–8.1)

## 2023-10-19 LAB — PHOSPHORUS: Phosphorus: 1.8 mg/dL — ABNORMAL LOW (ref 2.5–4.6)

## 2023-10-19 LAB — MAGNESIUM: Magnesium: 1.9 mg/dL (ref 1.7–2.4)

## 2023-10-19 MED ORDER — SODIUM PHOSPHATES 45 MMOLE/15ML IV SOLN
30.0000 mmol | Freq: Once | INTRAVENOUS | Status: AC
  Administered 2023-10-19: 30 mmol via INTRAVENOUS
  Filled 2023-10-19: qty 10

## 2023-10-19 MED ORDER — K PHOS MONO-SOD PHOS DI & MONO 155-852-130 MG PO TABS
500.0000 mg | ORAL_TABLET | Freq: Once | ORAL | Status: AC
  Administered 2023-10-19: 500 mg via ORAL
  Filled 2023-10-19: qty 2

## 2023-10-19 MED ORDER — POTASSIUM CHLORIDE 20 MEQ PO PACK
40.0000 meq | PACK | Freq: Once | ORAL | Status: DC
  Filled 2023-10-19: qty 2

## 2023-10-19 MED ORDER — POTASSIUM CHLORIDE CRYS ER 20 MEQ PO TBCR
40.0000 meq | EXTENDED_RELEASE_TABLET | ORAL | Status: DC
  Administered 2023-10-19: 40 meq via ORAL
  Filled 2023-10-19: qty 2

## 2023-10-19 MED ORDER — MELATONIN 5 MG PO TABS
5.0000 mg | ORAL_TABLET | Freq: Every evening | ORAL | Status: DC | PRN
  Administered 2023-10-19: 5 mg via ORAL
  Filled 2023-10-19: qty 1

## 2023-10-19 MED ORDER — CALCIUM GLUCONATE-NACL 1-0.675 GM/50ML-% IV SOLN
1.0000 g | Freq: Once | INTRAVENOUS | Status: AC
  Administered 2023-10-19: 1000 mg via INTRAVENOUS
  Filled 2023-10-19: qty 50

## 2023-10-19 MED ORDER — METOPROLOL TARTRATE 25 MG PO TABS
12.5000 mg | ORAL_TABLET | Freq: Two times a day (BID) | ORAL | Status: DC
  Administered 2023-10-19 – 2023-10-20 (×2): 12.5 mg via ORAL
  Filled 2023-10-19 (×2): qty 1

## 2023-10-19 NOTE — Progress Notes (Signed)
 Subjective: NG tube out. He denies nausea/vomiting. He is passing gas and had multiple BMs today. Tolerating his diet well.   ROS: See above, otherwise other systems negative  Objective: Vital signs in last 24 hours: Temp:  [98.2 F (36.8 C)-98.5 F (36.9 C)] 98.4 F (36.9 C) (07/17 0409) Pulse Rate:  [58-84] 84 (07/17 0409) Resp:  [16-18] 16 (07/17 0409) BP: (135-143)/(67-81) 138/67 (07/17 0409) SpO2:  [97 %-99 %] 98 % (07/17 0409) Last BM Date : 10/19/23  Intake/Output from previous day: 07/16 0701 - 07/17 0700 In: 1613.7 [P.O.:240; I.V.:1373.7] Out: 250 [Urine:250] Intake/Output this shift: No intake/output data recorded.  PE: Gen: NAD Abd: soft, not really tender, NGT with high bilious output  Lab Results:  Recent Labs    10/18/23 0330 10/19/23 0127  WBC 11.9* 15.6*  HGB 9.4* 10.1*  HCT 28.3* 30.9*  PLT 169 213   BMET Recent Labs    10/18/23 0330 10/19/23 0158  NA 136 136  K 2.5* 3.1*  CL 101 106  CO2 23 22  GLUCOSE 124* 113*  BUN 11 10  CREATININE 0.70 0.62  CALCIUM  6.7* 7.0*   PT/INR No results for input(s): LABPROT, INR in the last 72 hours.  CMP     Component Value Date/Time   NA 136 10/19/2023 0158   NA 141 08/02/2023 0942   K 3.1 (L) 10/19/2023 0158   CL 106 10/19/2023 0158   CO2 22 10/19/2023 0158   GLUCOSE 113 (H) 10/19/2023 0158   BUN 10 10/19/2023 0158   BUN 7 (L) 08/02/2023 0942   CREATININE 0.62 10/19/2023 0158   CREATININE 0.67 10/12/2023 1440   CALCIUM  7.0 (L) 10/19/2023 0158   PROT 5.3 (L) 10/19/2023 0158   PROT 7.0 01/09/2020 1445   ALBUMIN 2.4 (L) 10/19/2023 0158   AST 22 10/19/2023 0158   AST 37 10/12/2023 1440   ALT 26 10/19/2023 0158   ALT 35 10/12/2023 1440   ALKPHOS 51 10/19/2023 0158   BILITOT 0.8 10/19/2023 0158   BILITOT 0.3 10/12/2023 1440   GFRNONAA >60 10/19/2023 0158   GFRNONAA >60 10/12/2023 1440   Lipase  No results found for: LIPASE     Studies/Results: DG Abd Portable  1V Result Date: 10/18/2023 CLINICAL DATA:  73 year old male with suspected small-bowel obstruction on recent CTA. EXAM: PORTABLE ABDOMEN - 1 VIEW COMPARISON:  Abdominal radiographs yesterday and earlier. FINDINGS: Portable AP supine view at 0752 hours. Enteric tube now is visible in the left upper quadrant, terminating in the stomach. Ongoing gas distended bowel loops in the abdomen and pelvis, most of which appears to be small bowel. Mid abdominal small bowel loops remain dilated approximately 5.5 cm diameter. Bowel-gas pattern has not significantly improved compared to the CTA on 10/14/2023. No pneumoperitoneum is evident on these supine views. Stable visualized osseous structures. IMPRESSION: 1. Enteric tube terminates in the stomach. 2. No improvement in gas distended mostly small bowel loops in the abdomen and pelvis. Pattern currently favors small-bowel obstruction over ileus. Electronically Signed   By: VEAR Hurst M.D.   On: 10/18/2023 08:57    Anti-infectives: Anti-infectives (From admission, onward)    None        Assessment/Plan SBO  -NG out - FLD, will take slow and monitor closely, tolerating diet well so far.   FEN - FLD  VTE - ok for prophylaxis from our standpoint ID - none currently needed  MM - on oral chemo PAF HTN Depression HLD OSA  I reviewed hospitalist notes, last 24 h vitals and pain scores, last 48 h intake and output, last 24 h labs and trends, and last 24 h imaging results.   LOS: 5 days    Eulah Hammonds, [facility] 10/19/2023, 10:10 AM Please see Amion for pager number during day hours 7:00am-4:30pm or 7:00am -11:30am on weekends

## 2023-10-19 NOTE — Progress Notes (Signed)
 PHARMACY - ANTICOAGULATION CONSULT NOTE  Pharmacy Consult for IV UFH (PTA apixaban ) Indication: atrial fibrillation  Allergies  Allergen Reactions   Shrimp [Shellfish Allergy] Anaphylaxis and Swelling    Swelling throat     Patient Measurements: Height: 6' 4 (193 cm) Weight: 102 kg (224 lb 13.9 oz) IBW/kg (Calculated) : 86.8 HEPARIN  DW (KG): 102  Vital Signs: Temp: 98.1 F (36.7 C) (07/17 1615) Temp Source: Oral (07/17 1615) BP: 143/68 (07/17 1615) Pulse Rate: 70 (07/17 1615)  Labs: Recent Labs    10/17/23 0435 10/18/23 0330 10/19/23 0127 10/19/23 0158 10/19/23 0552 10/19/23 1600  HGB 9.7* 9.4* 10.1*  --   --   --   HCT 29.9* 28.3* 30.9*  --   --   --   PLT 167 169 213  --   --   --   HEPARINUNFRC  --   --   --   --  0.22* 0.46  CREATININE 0.71 0.70  --  0.62  --   --     Estimated Creatinine Clearance: 102.5 mL/min (by C-G formula based on SCr of 0.62 mg/dL).   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  Patient reports he does not have symptoms as of 02/25/14.     Diarrhea 06/04/2023   Essential hypertension 06/04/2023   Fatty liver 06/04/2023   Flu 06/04/2023   Hyperlipidemia 06/04/2023   Ileus (HCC) 06/04/2023   Neuropathy    Paroxysmal atrial fibrillation (HCC) 07/04/2023   Pneumonia 06/04/2023    Medications:  Scheduled:   metoprolol  tartrate  12.5 mg Oral BID   pantoprazole  (PROTONIX ) IV  40 mg Intravenous Q12H   potassium chloride   40 mEq Oral Once   Infusions:   heparin  1,600 Units/hr (10/19/23 1220)   sodium phosphate  30 mmol in sodium chloride  0.9 % 250 mL infusion 30 mmol (10/19/23 1628)    Assessment: 73 yo with bowel obstruction on apixaban  PTA for Afib hx. Has been on hold since admission started on SQ  heparin  for DVT ppx. Patient had an episode of RVR 7/16 afternoon, so MD electing to change to IV heparin  with no bolus until safe to resume apixaban  (when patient can tolerate PO again).   Today, 10/19/23: Most recent heparin  level now therapeutic on 1600 units/hr Hgb 10.1, plts 213--stable, improved No s/sx of bleeding documented  Goal of Therapy:  Heparin  level 0.3-0.7 units/ml Monitor platelets by anticoagulation protocol: Yes   Plan:  Continue IV heparin  at 1600 units/hr Check confirmatory heparin  level with AM labs Daily CBC & heparin  level Continue to monitor for s/sx of bleeding   Bard Jeans, PharmD, BCPS [phone] 10/19/2023, 5:18 PM

## 2023-10-19 NOTE — Progress Notes (Signed)
 PHARMACY - ANTICOAGULATION CONSULT NOTE  Pharmacy Consult for IV UFH (PTA apixaban ) Indication: atrial fibrillation  Allergies  Allergen Reactions   Shrimp [Shellfish Allergy] Anaphylaxis and Swelling    Swelling throat     Patient Measurements: Height: 6' 4 (193 cm) Weight: 102 kg (224 lb 13.9 oz) IBW/kg (Calculated) : 86.8 HEPARIN  DW (KG): 102  Vital Signs: Temp: 98.4 F (36.9 C) (07/17 0409) Temp Source: Oral (07/17 0409) BP: 138/67 (07/17 0409) Pulse Rate: 84 (07/17 0409)  Labs: Recent Labs    10/17/23 0435 10/18/23 0330 10/19/23 0127 10/19/23 0158 10/19/23 0552  HGB 9.7* 9.4* 10.1*  --   --   HCT 29.9* 28.3* 30.9*  --   --   PLT 167 169 213  --   --   HEPARINUNFRC  --   --   --   --  0.22*  CREATININE 0.71 0.70  --  0.62  --     Estimated Creatinine Clearance: 102.5 mL/min (by C-G formula based on SCr of 0.62 mg/dL).   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  Patient reports he does not have symptoms as of 02/25/14.     Diarrhea 06/04/2023   Essential hypertension 06/04/2023   Fatty liver 06/04/2023   Flu 06/04/2023   Hyperlipidemia 06/04/2023   Ileus (HCC) 06/04/2023   Neuropathy    Paroxysmal atrial fibrillation (HCC) 07/04/2023   Pneumonia 06/04/2023    Medications:  Scheduled:   pantoprazole  (PROTONIX ) IV  40 mg Intravenous Q12H   Infusions:   dextrose  5 % and 0.9 % NaCl 75 mL/hr at 10/19/23 0618   heparin  1,400 Units/hr (10/19/23 0333)    Assessment: 73 yo with bowel obstruction on apixaban  PTA for Afib hx. Has been on hold since admission started on SQ heparin  for DVT ppx. Patient had an episode of RVR 7/16 afternoon, so MD electing to change to IV heparin  with no bolus until safe to resume apixaban  (when  patient can tolerate PO again).   Today, 10/19/23: Heparin  level 0.22--subtherapeutic on heparin  1400 units/hr Hgb 10.1, plts 213--stable, improved No s/sx of bleeding documented  Goal of Therapy:  Heparin  level 0.3-0.7 units/ml Monitor platelets by anticoagulation protocol: Yes   Plan:  Increase IV heparin  rate to 1600 units/hr Check heparin  level in 8 hours Daily CBC, heparin  level Continue to monitor for s/sx of bleeding   Lacinda Moats, PharmD Clinical Pharmacist  7/17/20257:02 AM

## 2023-10-19 NOTE — Progress Notes (Signed)
 VAST consulted to obtain heparin  level from midline. Night shift VAST nurse started a PIV at 0217 this am and requested unit nurse move heparin  drip to PIV so that heparin  levels could be collected from midline.  Upon arrival as patient's bedside, noted heparin  drip to be infusing through midline. VAST RN disconnected heparin  drip and moved infusion to PIV in right arm. Proceeded to flush midline with 40mLs NS prior to drawing waste and collecting heparin  level (midline has sluggish blood return and needs tension on extension tubing for blood return although it flushes easily).  VAST RN phoned and notified pharmacy of collection so that lab levels can be redrawn if inconsistent.  Educated patient that midline may not be able to be used for lab draws for much longer, at which time phlebotomy will have to come stick him for labs. He verbalized understanding.

## 2023-10-19 NOTE — Progress Notes (Signed)
 PROGRESS NOTE  David Mcgrath  DOB: Mar 01, 1951  PCP: [name], DO FMW:[id number]  DOA: 10/14/2023  LOS: 5 days  Hospital Day: 6  Brief narrative: David Mcgrath is a 73 y.o. male with PMH significant for HTN, HLD, paroxysmal A-fib, neuropathy, fatty liver, recent diagnosis of multiple myeloma 06/2023 currently on chemotherapy with Velcade  infusion and oral  Revlimid .  Last treatment was on 10/12/2023, cycle 3. Posttreatment, did not feel good.  Due to diarrhea resumed Revlimid  at a lower dose only.  Continue to have nausea, vomiting, 1 episode of coffee-ground content 7/12, 2 days after the last treatment, patient presented to the ED with complaint of intractable nausea vomiting, coffee-ground emesis.  In the ED, patient is afebrile, blood pressure in low normal range, breathing on room air. Labs with WC count 16.2, hemoglobin 10.8, potassium 3.3 FOBT positive Urinalysis showed cloudy yellow urine with small hemoglobin, large leukocytes  CT angio abdomen pelvis showed  1. No c evidence of GI bleeding 2. Progressive small bowel distension with suspected transition point in the right hemiabdomen, suspicious for small bowel obstruction. 3. Question of internal hernia with transverse colon coursing posterior to the stomach, but no inflammation or obstructive change at this site. 4. Small volume ascites, Generalized body wall edema/anasarca. 5. Dependent ground-glass opacities in the lung bases may represent hypoventilatory change or infection. Aspiration is also considered. 6. Known lytic lesions, without significant change.  Patient was given IV fluid, IV Protonix , IV Zofran , IV Reglan  Admitted to [facility] General Surgery was consulted See below for details   Subjective: Patient was seen and examined this morning. Lying down in bed.  Not in distress. On clear liquid diet.  General surgery following. Heparin  drip since yesterday.   Hemodynamically  stable  Assessment and plan: Small bowel obstruction  Presented with nausea, vomiting  Imagings as above raising suspicion of SBO versus ileus General surgery consulted Started on conservative management with NG tube insertion, bowel rest 7/13, underwent SBO protocol. Bowel function returned.  NG tube is removed.  Currently on clear liquid diet.   A-fib with RVR  H/o paroxysmal A-fib 7/16, heart rate transiently went up to 120s and 140s today on ambulating. He was not on any rate control medications at home.  Ectopy and RVR are likely due to low potassium.  Replacement is given. He was off Eliquis  for several days.  IV heparin  drip started Continue to monitor for now in telemetry Per RN, his heart rate still jumps when he stands up or walk.  Check orthostatic vital signs.  Continue IV fluid.  If no drop in blood pressure, will start on oral metoprolol  12.5 mg twice daily Last echo from April 2025 with EF 60 to 65%, no WMA, mild MR. Follows up with Dr. Ladona  Acute upper GI bleeding  Chronic anemia Mentioned coffee ground emesis, Hemoccult positive NG tube showed coffee-ground color. CT angio abdomen pelvis did not show any source of bleeding Patient could have had esophageal mucosal tear due to intense retching Eliquis  was held.  Hemoglobin remains stable. Currently on IV heparin  drip since 7/16 Currently on IV Protonix  twice daily Follows up with GI Dr. Dianna as an outpatient.  Hemoglobin gets monitored at cancer center as well. Recent Labs    03/14/23 0909 04/25/23 1627 07/13/23 1351 07/31/23 0748 10/15/23 0415 10/16/23 0248 10/17/23 0435 10/18/23 0330 10/19/23 0127  HGB  --    < >  --    < > 9.5* 9.2* 9.7*  9.4* 10.1*  MCV  --    < >  --    < > 96.3 97.5 99.7 95.6 96.9  VITAMINB12 850  --  992*  --   --   --   --   --   --   FOLATE  --   --  18.3  --   --   --   --   --   --   TIBC  --   --  241*  --   --   --   --   --   --   IRON  --   --  70  --   --   --   --    --   --    < > = values in this interval not displayed.   Hypokalemia/hypomagnesemia/hypophosphatemia Labs from this morning with potassium level low at 3.1 and phosphorus level low at 1.8.  Replacement given Recent Labs  Lab 10/14/23 1801 10/15/23 0415 10/16/23 0248 10/17/23 0435 10/18/23 0330 10/19/23 0158  K  --  3.0* 2.8* 3.1* 2.5* 3.1*  MG 1.5* 2.0  --  2.1  --  1.9  PHOS  --   --   --  1.8*  --  1.8*   Hypocalcemia Calcium  level low low at 7. 1 dose of IV calcium  gluconate ordered again today. Recent Labs  Lab 10/12/23 1440 10/14/23 1406 10/15/23 0415 10/16/23 0248 10/17/23 0435 10/18/23 0330 10/19/23 0158  CALCIUM  8.6* 8.6* 7.7* 7.0* 6.9* 6.7* 7.0*    Multiple myeloma IgG Kappa MM, diagnosed in march of 2025 Followed by Dr.Dorsey  Was on Velcade  plus Dexamethasone  and Revlimid , last infusion 10/12/2023. Day 15, cycle 3  Was also on Zyvox  twice daily.  Continue the same  Leukocytosis No evidence of infection. No fever.  WBC count up trending. Recent Labs  Lab 10/14/23 1802 10/14/23 1947 10/14/23 2233 10/15/23 0415 10/16/23 0248 10/17/23 0435 10/18/23 0330 10/19/23 0127  WBC  --   --    < > 13.2* 9.7 10.7* 11.9* 15.6*  LATICACIDVEN 1.9 1.7  --   --   --   --   --   --    < > = values in this interval not displayed.    Neuropathy Per report, EMG nerve conduction study done on 01/30/2020 by Dr. Margaret confirmed severe axonal sensorimotor polyneuropathy as well as moderate right carpal tunnel.  Continue gabapentin  600 mg TID when able to.   Depression Resume prozac  daily when able to   Hyperlipidemia Resume pravachol and zetia when able to   OSA (obstructive sleep apnea) Oxygen at night until NG tube removed and then cpap     Mobility: Seen by PT.  Home health PT recommended.  Goals of care   Code Status: Full Code     DVT prophylaxis:  SCDs Start: 10/14/23 1729   Antimicrobials: None Fluid: Currently on D5NS at 75 mL/h Consultants:  General Surgery Family Communication: None at bedside  Status: Inpatient Level of care:  Progressive   Patient is from: Home Needs to continue in-hospital care: Bowel obstruction resolving. Monitor orthostatics Anticipated d/c to: Today versus tomorrow    Diet:  Diet Order             Diet clear liquid Room service appropriate? Yes; Fluid consistency: Thin  Diet effective now                   Scheduled Meds:  pantoprazole  (PROTONIX ) IV  40 mg Intravenous Q12H    PRN meds: acetaminophen  **OR** acetaminophen , menthol -cetylpyridinium, metoprolol  tartrate, morphine  injection, ondansetron  **OR** ondansetron  (ZOFRAN ) IV, phenol, sodium chloride  flush   Infusions:   calcium  gluconate     dextrose  5 % and 0.9 % NaCl 75 mL/hr at 10/19/23 0911   heparin  1,600 Units/hr (10/19/23 0735)      Antimicrobials: Anti-infectives (From admission, onward)    None       Objective: Vitals:   10/18/23 1930 10/19/23 0409  BP: (!) 143/78 138/67  Pulse: (!) 58 84  Resp: 18 16  Temp: 98.5 F (36.9 C) 98.4 F (36.9 C)  SpO2: 97% 98%    Intake/Output Summary (Last 24 hours) at 10/19/2023 1113 Last data filed at 10/19/2023 1103 Gross per 24 hour  Intake 1613.72 ml  Output 100 ml  Net 1513.72 ml   Filed Weights   10/14/23 1229  Weight: 102 kg   Weight change:  Body mass index is 27.37 kg/m.   Physical Exam: General exam: Pleasant, elderly Caucasian male.  Skin: No rashes, lesions or ulcers. HEENT: Atraumatic, normocephalic, no obvious bleeding Lungs: Clear to auscultation bilaterally,  CVS: S1, S2, no murmur,   GI/Abd: Soft, nontender, distended, bowel sound present CNS: Alert, awake, oriented x 3 Psychiatry: Mood appropriate Extremities: No pedal edema, no calf tenderness,   Data Review: I have personally reviewed the laboratory data and studies available.  F/u labs  Unresulted Labs (From admission, onward)     Start     Ordered   10/19/23 1530  Heparin   level (unfractionated)  Once-Timed,   TIMED       Question:  Specimen collection method  Answer:  IV Team=IV Team collect   10/19/23 0940   10/19/23 0500  CBC with Differential/Platelet  Tomorrow morning,   R       Question:  Specimen collection method  Answer:  IV Team=IV Team collect   10/18/23 1307   10/19/23 0500  CBC  Daily,   R     Question:  Specimen collection method  Answer:  IV Team=IV Team collect   10/18/23 1609            Signed, Chapman Rota, MD [facility] 10/19/2023

## 2023-10-20 ENCOUNTER — Other Ambulatory Visit (HOSPITAL_BASED_OUTPATIENT_CLINIC_OR_DEPARTMENT_OTHER): Payer: Self-pay

## 2023-10-20 ENCOUNTER — Other Ambulatory Visit (HOSPITAL_COMMUNITY): Payer: Self-pay

## 2023-10-20 ENCOUNTER — Encounter: Payer: Self-pay | Admitting: Cardiology

## 2023-10-20 DIAGNOSIS — K56609 Unspecified intestinal obstruction, unspecified as to partial versus complete obstruction: Secondary | ICD-10-CM | POA: Diagnosis not present

## 2023-10-20 LAB — CBC
HCT: 28.8 % — ABNORMAL LOW (ref 39.0–52.0)
Hemoglobin: 9.3 g/dL — ABNORMAL LOW (ref 13.0–17.0)
MCH: 31.7 pg (ref 26.0–34.0)
MCHC: 32.3 g/dL (ref 30.0–36.0)
MCV: 98.3 fL (ref 80.0–100.0)
Platelets: 222 K/uL (ref 150–400)
RBC: 2.93 MIL/uL — ABNORMAL LOW (ref 4.22–5.81)
RDW: 16.4 % — ABNORMAL HIGH (ref 11.5–15.5)
WBC: 14.5 K/uL — ABNORMAL HIGH (ref 4.0–10.5)
nRBC: 0 % (ref 0.0–0.2)

## 2023-10-20 LAB — HEPARIN LEVEL (UNFRACTIONATED): Heparin Unfractionated: 0.27 [IU]/mL — ABNORMAL LOW (ref 0.30–0.70)

## 2023-10-20 MED ORDER — METOPROLOL TARTRATE 25 MG PO TABS
12.5000 mg | ORAL_TABLET | Freq: Every day | ORAL | 0 refills | Status: DC | PRN
  Filled 2023-10-20: qty 30, 60d supply, fill #0

## 2023-10-20 MED ORDER — ORAL CARE MOUTH RINSE: 15.0000 mL | OROMUCOSAL | Status: DC | PRN

## 2023-10-20 MED ORDER — APIXABAN 5 MG PO TABS
5.0000 mg | ORAL_TABLET | Freq: Two times a day (BID) | ORAL | Status: DC
  Administered 2023-10-20: 5 mg via ORAL
  Filled 2023-10-20: qty 1

## 2023-10-20 NOTE — Progress Notes (Signed)
       Subjective: Patient denies nausea/vomiting. He is passing gas and had loose BM this AM. He is tolerating his diet well.   ROS: See above, otherwise other systems negative  Objective: Vital signs in last 24 hours: Temp:  [97.7 F (36.5 C)-98.1 F (36.7 C)] 97.7 F (36.5 C) (07/18 0545) Pulse Rate:  [59-83] 59 (07/18 0545) Resp:  [18-33] 18 (07/18 0545) BP: (117-143)/(67-71) 117/71 (07/18 0545) SpO2:  [100 %] 100 % (07/18 0545) Last BM Date : 10/20/23  Intake/Output from previous day: 07/17 0701 - 07/18 0700 In: 1721.2 [P.O.:1080; I.V.:381.2; IV Piggyback:260] Out: 100 [Urine:100] Intake/Output this shift: No intake/output data recorded.  PE: Gen: NAD Abd: soft, not really tender, ND.   Lab Results:  Recent Labs    10/19/23 0127 10/20/23 0410  WBC 15.6* 14.5*  HGB 10.1* 9.3*  HCT 30.9* 28.8*  PLT 213 222   BMET Recent Labs    10/18/23 0330 10/19/23 0158  NA 136 136  K 2.5* 3.1*  CL 101 106  CO2 23 22  GLUCOSE 124* 113*  BUN 11 10  CREATININE 0.70 0.62  CALCIUM  6.7* 7.0*   PT/INR No results for input(s): LABPROT, INR in the last 72 hours.  CMP     Component Value Date/Time   NA 136 10/19/2023 0158   NA 141 08/02/2023 0942   K 3.1 (L) 10/19/2023 0158   CL 106 10/19/2023 0158   CO2 22 10/19/2023 0158   GLUCOSE 113 (H) 10/19/2023 0158   BUN 10 10/19/2023 0158   BUN 7 (L) 08/02/2023 0942   CREATININE 0.62 10/19/2023 0158   CREATININE 0.67 10/12/2023 1440   CALCIUM  7.0 (L) 10/19/2023 0158   PROT 5.3 (L) 10/19/2023 0158   PROT 7.0 01/09/2020 1445   ALBUMIN 2.4 (L) 10/19/2023 0158   AST 22 10/19/2023 0158   AST 37 10/12/2023 1440   ALT 26 10/19/2023 0158   ALT 35 10/12/2023 1440   ALKPHOS 51 10/19/2023 0158   BILITOT 0.8 10/19/2023 0158   BILITOT 0.3 10/12/2023 1440   GFRNONAA >60 10/19/2023 0158   GFRNONAA >60 10/12/2023 1440   Lipase  No results found for: LIPASE     Studies/Results: No results  found.   Anti-infectives: Anti-infectives (From admission, onward)    None        Assessment/Plan SBO  - FLD>>>Soft, will take slow and monitor closely.  -Patient is stable for DC from a surgical standpoint- NGT out, no N/V, +BM, +Flatus, tolerating diet.   FEN - FLD>>>Soft  VTE - Okay for chemical prophylaxis from our standpoint, SCDs ID - none currently needed  MM - on oral chemo PAF HTN Depression HLD OSA  I reviewed hospitalist notes, last 24 h vitals and pain scores, last 48 h intake and output, last 24 h labs and trends, and last 24 h imaging results.   LOS: 6 days    Eulah Hammonds, [facility] 10/20/2023, 8:07 AM Please see Amion for pager number during day hours 7:00am-4:30pm or 7:00am -11:30am on weekends

## 2023-10-20 NOTE — Progress Notes (Signed)
 PHARMACY - ANTICOAGULATION CONSULT NOTE  Pharmacy Consult for IV UFH (PTA apixaban ) Indication: atrial fibrillation  Allergies  Allergen Reactions   Shrimp [Shellfish Allergy] Anaphylaxis and Swelling    Swelling throat     Patient Measurements: Height: 6' 4 (193 cm) Weight: 102 kg (224 lb 13.9 oz) IBW/kg (Calculated) : 86.8 HEPARIN  DW (KG): 102  Vital Signs: Temp: 98 F (36.7 C) (07/17 2125) Temp Source: Oral (07/17 2125) BP: 139/67 (07/17 2125) Pulse Rate: 83 (07/17 2125)  Labs: Recent Labs    10/18/23 0330 10/19/23 0127 10/19/23 0158 10/19/23 0552 10/19/23 1600 10/20/23 0410  HGB 9.4* 10.1*  --   --   --  9.3*  HCT 28.3* 30.9*  --   --   --  28.8*  PLT 169 213  --   --   --  222  HEPARINUNFRC  --   --   --  0.22* 0.46 0.27*  CREATININE 0.70  --  0.62  --   --   --     Estimated Creatinine Clearance: 102.5 mL/min (by C-G formula based on SCr of 0.62 mg/dL).   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  Patient reports he does not have symptoms as of 02/25/14.     Diarrhea 06/04/2023   Essential hypertension 06/04/2023   Fatty liver 06/04/2023   Flu 06/04/2023   Hyperlipidemia 06/04/2023   Ileus (HCC) 06/04/2023   Neuropathy    Paroxysmal atrial fibrillation (HCC) 07/04/2023   Pneumonia 06/04/2023    Medications:  Scheduled:   metoprolol  tartrate  12.5 mg Oral BID   pantoprazole  (PROTONIX ) IV  40 mg Intravenous Q12H   potassium chloride   40 mEq Oral Once   Infusions:   heparin  1,600 Units/hr (10/20/23 0416)    Assessment: 73 yo with bowel obstruction on apixaban  PTA for Afib hx. Has been on hold since admission started on SQ heparin  for DVT ppx. Patient had an episode of RVR 7/16 afternoon, so MD electing to change to  IV heparin  with no bolus until safe to resume apixaban  (when patient can tolerate PO again).   Today, 10/20/23: Heparin  level 0.27- now sub-therapeutic on IV heparin  1600 units/hr Lab collected by IV RN from midline; heparin  infusing peripehrally Hgb 9.3, plts 222--stable No s/sx of bleeding or infusion related concerns reported by RN  Goal of Therapy:  Heparin  level 0.3-0.7 units/ml Monitor platelets by anticoagulation protocol: Yes   Plan:  Increase IV heparin  to 1800 units/hr Check heparin  level 8h after rate increase Daily CBC & heparin  level Continue to monitor for s/sx of bleeding  Rosaline Millet, PharmD, BCPS 10/20/2023, 4:51 AM

## 2023-10-20 NOTE — Discharge Summary (Signed)
 Physician Discharge Summary  David Mcgrath FMW:[id number] DOB: 1950/04/30 DOA: 10/14/2023  PCP: [name], DO  Admit date: 10/14/2023 Discharge date: 10/20/2023  Admitted From: Home Discharge disposition: Home with home health PT, OT  Recommendations at discharge:  Stay on soft diet for next 1 to 2 days and gradually advance as tolerated Can take metoprolol  tartrate 12.5 mg as needed for persistent palpitation.  I have sent a message to Dr. Ladona.  His office will call for an appointment if needed.  Brief narrative: David Mcgrath is a 73 y.o. male with PMH significant for HTN, HLD, paroxysmal A-fib, neuropathy, fatty liver, recent diagnosis of multiple myeloma 06/2023 currently on chemotherapy with Velcade  infusion and oral  Revlimid .  Last treatment was on 10/12/2023, cycle 3. Posttreatment, did not feel good.  Due to diarrhea resumed Revlimid  at a lower dose only.  Continue to have nausea, vomiting, 1 episode of coffee-ground content 7/12, 2 days after the last treatment, patient presented to the ED with complaint of intractable nausea vomiting, coffee-ground emesis.  In the ED, patient is afebrile, blood pressure in low normal range, breathing on room air. Labs with WC count 16.2, hemoglobin 10.8, potassium 3.3 FOBT positive Urinalysis showed cloudy yellow urine with small hemoglobin, large leukocytes  CT angio abdomen pelvis showed  1. No c evidence of GI bleeding 2. Progressive small bowel distension with suspected transition point in the right hemiabdomen, suspicious for small bowel obstruction.  Patient was given IV fluid, IV Protonix , IV Zofran , IV Reglan  Admitted to [facility] General Surgery was consulted See below for details   Subjective: Patient was seen and examined this morning. Lying down in bed.  Not in distress. Tolerating soft diet. Cleared by general surgery for discharge today Switched from heparin  drip back to Eliquis  this morning. I had a  long discussion with patient and his wife at bedside.  Assessment and plan: Small bowel obstruction  Presented with nausea, vomiting  Imagings as above raising suspicion of SBO versus ileus General surgery was consulted.  Patient was started on conservative management with NG tube insertion, bowel rest 7/13, underwent SBO protocol. Bowel function returned.  NG tube was removed. Clear liquid diet was started and advance very gradually.  Patient has had consistent bowel function, able to tolerate soft food and stable for discharge today  A-fib with RVR  H/o paroxysmal A-fib 7/16, heart rate transiently went up to 120s and 140s today on ambulating. He was not on any rate control medications at home.  Ectopy and RVR are likely due to low potassium.  Electrolytes were replaced. 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 Patient states that he was seen by Dr. Ladona in the past and noted to have tachycardia intermittently on standing up.  He was recommended to maneuver slowly while standing up.  He was not started on any rate control medicine. His blood pressure allowed us  to start him on metoprolol  tartrate 12.5 mg twice daily.  At discharge, I would only recommend to use it as needed for persistent palpitation. Last echo from April 2025 with EF 60 to 65%, no WMA, mild MR. I have sent a staff message in epic to Dr. Ladona to set up an outpatient follow-up. Continue Eliquis  as before.  Acute upper GI bleeding  Chronic anemia Mentioned coffee ground emesis, Hemoccult positive NG tube showed coffee-ground color. CT angio abdomen pelvis did not show any  source of bleeding Patient could have had esophageal mucosal tear due to intense retching While in the hospital, he did not have any further bleeding.  Hemoglobin remained stable. Eliquis  has been resumed Follows up with GI Dr. Dianna as an outpatient.  Hemoglobin gets  monitored at cancer center as well. Recent Labs    03/14/23 0909 04/25/23 1627 07/13/23 1351 07/31/23 0748 10/16/23 0248 10/17/23 0435 10/18/23 0330 10/19/23 0127 10/20/23 0410  HGB  --    < >  --    < > 9.2* 9.7* 9.4* 10.1* 9.3*  MCV  --    < >  --    < > 97.5 99.7 95.6 96.9 98.3  VITAMINB12 850  --  992*  --   --   --   --   --   --   FOLATE  --   --  18.3  --   --   --   --   --   --   TIBC  --   --  241*  --   --   --   --   --   --   IRON  --   --  70  --   --   --   --   --   --    < > = values in this interval not displayed.   Hypokalemia/hypomagnesemia/hypophosphatemia/hypocalcemia Electrolyte levels were low because of poor intake, NG output.  Aggressively replaced.   Multiple myeloma IgG Kappa MM, diagnosed in march of 2025 Followed by Dr.Dorsey  Was on Velcade  plus Dexamethasone  and Revlimid , last infusion 10/12/2023. Day 15, cycle 3  Was also on Zyvox  twice daily.  Continue the same  Leukocytosis No evidence of infection. No fever.  WBC count downtrending now. Recent Labs  Lab 10/14/23 1802 10/14/23 1947 10/14/23 2233 10/16/23 0248 10/17/23 0435 10/18/23 0330 10/19/23 0127 10/20/23 0410  WBC  --   --    < > 9.7 10.7* 11.9* 15.6* 14.5*  LATICACIDVEN 1.9 1.7  --   --   --   --   --   --    < > = values in this interval not displayed.    Neuropathy Per report, EMG nerve conduction study done on 01/30/2020 by Dr. Margaret confirmed severe axonal sensorimotor polyneuropathy as well as moderate right carpal tunnel.  Continue gabapentin  600 mg TID when able to.   Depression Continue prozac  daily as before   Hyperlipidemia Continue pravachol and zetia as before   OSA (obstructive sleep apnea) Oxygen at night until NG tube removed and then cpap     Mobility: Seen by PT.  Home health PT recommended.  Goals of care   Code Status: Full Code   Diet:  Diet Order             DIET SOFT Room service appropriate? Yes; Fluid consistency: Thin  Diet  effective now           Diet general                   Nutritional status:  Body mass index is 27.37 kg/m.       Wounds:  - Wound 10/14/23 1730 Other (Comment) Nose Mid (Active)  Date First Assessed/Time First Assessed: 10/14/23 1730   Present on Original Admission: Yes  Primary Wound Type: (c) Other (Comment)  Location: Nose  Location Orientation: Mid  Wound Outcome: Healed    Assessments 10/14/2023  5:30 PM 10/19/2023  7:59 AM  Wound Image     Site / Wound Assessment Clean;Dry Dry;Pink  Peri-wound Assessment Intact Intact  Wound Length (cm) 0.5 cm --  Wound Width (cm) 0.5 cm --  Wound Surface Area (cm^2) 0.2 cm^2 --  Drainage Amount None None  Treatments Other (Comment) --  Dressing Type None None     No associated orders.    Discharge Exam:   Vitals:   10/19/23 1654 10/19/23 1700 10/19/23 2125 10/20/23 0545  BP:   139/67 117/71  Pulse:   83 (!) 59  Resp: (!) 33 20 18 18   Temp:   98 F (36.7 C) 97.7 F (36.5 C)  TempSrc:   Oral Oral  SpO2:   100% 100%  Weight:      Height:        Body mass index is 27.37 kg/m.  General exam: Pleasant, elderly Caucasian male.  Skin: No rashes, lesions or ulcers. HEENT: Atraumatic, normocephalic, no obvious bleeding Lungs: Clear to auscultation bilaterally,  CVS: S1, S2, no murmur,   GI/Abd: Soft, nontender, distended, bowel sound present CNS: Alert, awake, oriented x 3 Psychiatry: Mood appropriate Extremities: Chronic bilateral trace pedal edema, no calf tenderness,   Follow ups:    Follow-up Information     [facility] Follow up.   Why: [facility] will resume PT and OT in the home after discharge.        [name], DO Follow up.   Specialty: Internal Medicine Contact information: [address] [place] [address] [phone]         Ladona Heinz, MD Follow up.   Specialty: Cardiology Contact information: [address] [place] [address] [phone]                  Discharge Instructions:   Discharge Instructions     Call MD for:  difficulty breathing, headache or visual disturbances   Complete by: As directed    Call MD for:  extreme fatigue   Complete by: As directed    Call MD for:  hives   Complete by: As directed    Call MD for:  persistant dizziness or light-headedness   Complete by: As directed    Call MD for:  persistant nausea and vomiting   Complete by: As directed    Call MD for:  severe uncontrolled pain   Complete by: As directed    Call MD for:  temperature >100.4   Complete by: As directed    Diet general   Complete by: As directed    Discharge instructions   Complete by: As directed    Recommendations at discharge:   Stay on soft diet for next 1 to 2 days and gradually advance as tolerated  Can take metoprolol  tartrate 12.5 mg as needed for persistent palpitation.  I have sent a message to Dr. Ladona.  His office will call for an appointment if needed.  General discharge instructions: Follow with Primary MD [name], DO in 7 days  Please request your PCP  to go over your hospital tests, procedures, radiology results at the follow up. Please get your medicines reviewed and adjusted.  Your PCP may decide to repeat certain labs or tests as needed. Do not drive, operate heavy machinery, perform activities at heights, swimming or participation in water activities or provide baby sitting services if your were admitted for syncope or siezures until you have seen by Primary MD or a Neurologist and advised to do so again.  [place]  Controlled Substance Reporting System database was reviewed. Do not drive, operate heavy machinery, perform activities at heights, swim, participate in water activities or provide baby-sitting services while on medications for pain, sleep and mood until your outpatient physician has reevaluated you and advised to do so again.  You are strongly recommended to comply with the dose,  frequency and duration of prescribed medications. Activity: As tolerated with Full fall precautions use walker/cane & assistance as needed Avoid using any recreational substances like cigarette, tobacco, alcohol , or non-prescribed drug. If you experience worsening of your admission symptoms, develop shortness of breath, life threatening emergency, suicidal or homicidal thoughts you must seek medical attention immediately by calling 911 or calling your MD immediately  if symptoms less severe. You must read complete instructions/literature along with all the possible adverse reactions/side effects for all the medicines you take and that have been prescribed to you. Take any new medicine only after you have completely understood and accepted all the possible adverse reactions/side effects.  Wear Seat belts while driving. You were cared for by a hospitalist during your hospital stay. If you have any questions about your discharge medications or the care you received while you were in the hospital after you are discharged, you can call the unit and ask to speak with the hospitalist or the covering physician. Once you are discharged, your primary care physician will handle any further medical issues. Please note that NO REFILLS for any discharge medications will be authorized once you are discharged, as it is imperative that you return to your primary care physician (or establish a relationship with a primary care physician if you do not have one).   Discharge wound care:   Complete by: As directed    Increase activity slowly   Complete by: As directed        Discharge Medications:   Allergies as of 10/20/2023       Reactions   Shrimp [shellfish Allergy] Anaphylaxis, Swelling   Swelling throat         Medication List     TAKE these medications    acyclovir  400 MG tablet Commonly known as: ZOVIRAX  Take 1 tablet (400 mg total) by mouth 2 (two) times daily.   allopurinol  300 MG  tablet Commonly known as: ZYLOPRIM  Take 300 mg by mouth daily.   apixaban  5 MG Tabs tablet Commonly known as: ELIQUIS  Take 1 tablet (5 mg total) by mouth 2 (two) times daily.   calcium -vitamin D 500-5 MG-MCG tablet Commonly known as: OSCAL WITH D Take 1 tablet by mouth 2 (two) times daily.   dexamethasone  4 MG tablet Commonly known as: DECADRON  Take 40 mg by mouth once a week on day of Velcade  injection. (10 tablets)   diphenoxylate -atropine  2.5-0.025 MG tablet Commonly known as: LOMOTIL  Take 1 tablet by mouth 4 (four) times daily as needed for diarrhea or loose stools.   ezetimibe 10 MG tablet Commonly known as: ZETIA Take 10 mg by mouth every evening.   FLUoxetine  20 MG capsule Commonly known as: PROZAC  Take 20 mg by mouth daily.   fluticasone 50 MCG/ACT nasal spray Commonly known as: FLONASE Place 1 spray into both nostrils daily.   gabapentin  300 MG capsule Commonly known as: NEURONTIN  Take 2 capsules (600 mg total) by mouth 2 (two) times daily. What changed: when to take this   guaiFENesin  600 MG 12 hr tablet Commonly known as: MUCINEX  Take 600 mg by mouth daily.   guaifenesin  100 MG/5ML syrup Commonly  known as: ROBITUSSIN Take 200 mg by mouth daily as needed for cough.   lenalidomide  10 MG capsule Commonly known as: REVLIMID  Take 1 capsule (10 mg total) by mouth daily. Bertrum Barrows # [id number]     Date Obtained 10/05/23 Take one daily for 21 days then none for 7 days.   melatonin 5 MG Tabs Take 5 mg by mouth at bedtime.   metoprolol  tartrate 25 MG tablet Commonly known as: LOPRESSOR  Take 0.5 tablets (12.5 mg total) by mouth daily as needed (For persistent palpitation).   omeprazole 40 MG capsule Commonly known as: PRILOSEC Take 40 mg by mouth daily.   ondansetron  8 MG tablet Commonly known as: Zofran  Take 1 tablet (8 mg total) by mouth every 8 (eight) hours as needed for nausea or vomiting.   potassium chloride  SA 20 MEQ tablet Commonly known as:  KLOR-CON  M Take 1 tablet (20 mEq total) by mouth 2 (two) times daily.   pravastatin 80 MG tablet Commonly known as: PRAVACHOL Take 80 mg by mouth at bedtime.   prochlorperazine  10 MG tablet Commonly known as: COMPAZINE  Take 1 tablet (10 mg total) by mouth every 6 (six) hours as needed for nausea or vomiting.   pyridoxine 100 MG tablet Commonly known as: B-6 Take 100 mg by mouth daily.   Vitamin B12 1000 MCG Tbcr Take 1,000 mcg by mouth daily.   Vitamin D-3 125 MCG (5000 UT) Tabs Take 5,000 Units by mouth daily.               Discharge Care Instructions  (From admission, onward)           Start     Ordered   10/20/23 0000  Discharge wound care:        10/20/23 1105             The results of significant diagnostics from this hospitalization (including imaging, microbiology, ancillary and laboratory) are listed below for reference.    Procedures and Diagnostic Studies:   DG Abd Portable 1V-Small Bowel Obstruction Protocol-initial, 8 hr delay Result Date: 10/15/2023 CLINICAL DATA:  Small-bowel obstruction-8 hour delay EXAM: PORTABLE ABDOMEN - 1 VIEW COMPARISON:  CT abdomen pelvis 10/14/2023 FINDINGS: Persistent dilation of the small bowel. Contrast is not definitively visualized the on the stomach. Contrast in the bladder from prior CT. IMPRESSION: Persistent small bowel obstruction. Electronically Signed   By: Norman Gatlin M.D.   On: 10/15/2023 18:39   CT Angio Abd/Pel W and/or Wo Contrast Result Date: 10/14/2023 CLINICAL DATA:  Lower GI bleed. Coffee ground emesis. History of multiple myeloma. EXAM: CTA ABDOMEN AND PELVIS WITHOUT AND WITH CONTRAST TECHNIQUE: Multidetector CT imaging of the abdomen and pelvis was performed using the standard protocol during bolus administration of intravenous contrast. Multiplanar reconstructed images and MIPs were obtained and reviewed to evaluate the vascular anatomy. RADIATION DOSE REDUCTION: This exam was performed  according to the departmental dose-optimization program which includes automated exposure control, adjustment of the mA and/or kV according to patient size and/or use of iterative reconstruction technique. CONTRAST:  OMNIPAQUE  IOHEXOL  350 MG/ML SOLN COMPARISON:  CT 06/08/2023 FINDINGS: VASCULAR Aorta: Normal caliber aorta without aneurysm, dissection, vasculitis or significant stenosis. Moderate aortic atherosclerosis. Celiac: Dense plaque at the origin causes high-grade stenosis with mild poststenotic dilatation. No dissection or aneurysm. SMA: Patent without evidence of aneurysm, dissection, vasculitis or significant stenosis. Renals: Mild plaque at the origin of both renal arteries without significant stenosis. Both renal arteries are patent without evidence of  aneurysm, dissection, vasculitis, or fibromuscular dysplasia. IMA: Patent without evidence of aneurysm, dissection, vasculitis or significant stenosis. Inflow: Coarse plaque at the origin of the common iliac arteries causes less than 50% stenosis. Distal branches are patent. Proximal Outflow: Plaque in the left femoral artery without severe stenosis. No dissection or acute findings. Veins: Venous phase imaging demonstrates no acute findings. Patent portal and splenic veins. Patent mesenteric veins. No portal venous or mesenteric gas. Review of the MIP images confirms the above findings. NON-VASCULAR Lower chest: Minimal left pleural thickening is unchanged from prior exam. Dependent ground-glass opacities may represent hypoventilatory change or infection. Hepatobiliary: No focal liver abnormality is seen. No gallstones, gallbladder wall thickening, or biliary dilatation. Pancreas: No ductal dilatation or inflammation. Spleen: Normal in size without focal abnormality. Adrenals/Urinary Tract: Normal adrenal glands. No hydronephrosis or renal calculi. No suspicious renal abnormality. The urinary bladder is partially distended, mildly thick walled.  Stomach/Bowel: No contrast accumulation in the GI tract to localize site of GI bleed. Progressive small bowel distension with suspected transition point in the right hemiabdomen, series 8, image 97. There is adjacent mesenteric edema. Small bowel distal to this is decompressed. Small bowel proximally is dilated and fluid-filled. The stomach is dilated and fluid-filled. Fluid distention of the included distal esophagus. The transverse colon is tortuous, courses posterior to the stomach. Question of underlying mesenteric defect in internal hernia, but no wall thickening or obstruction at this point. Mild left colonic diverticulosis without diverticulitis. The appendix is not confidently visualized. Lymphatic: No enlarged lymph nodes in the abdomen or pelvis. Reproductive: Prostate is unremarkable. Other: Small volume abdominopelvic ascites, new from prior exam. Generalized body wall edema. There is fluid within both inguinal canals. No free air or focal fluid collection. Small left lumbar hernia contains only fat. Musculoskeletal: Scattered lytic lesions are again seen, not significantly changed. No evidence of pathologic fracture or compression deformity. Multilevel degenerative change in the spine. IMPRESSION: 1. No contrast accumulation in the GI tract to localize site of GI bleed. 2. Progressive small bowel distension with suspected transition point in the right hemiabdomen, suspicious for small bowel obstruction. 3. Question of internal hernia with transverse colon coursing posterior to the stomach, but no inflammation or obstructive change at this site. 4. Small volume abdominopelvic ascites, new from prior exam. Generalized body wall edema/anasarca. 5. Dependent ground-glass opacities in the lung bases may represent hypoventilatory change or infection. Aspiration is also considered. 6. Known lytic lesions, without significant change. Aortic Atherosclerosis (ICD10-I70.0). Electronically Signed   By: Andrea Gasman M.D.   On: 10/14/2023 15:58   DG Chest Portable 1 View Result Date: 10/14/2023 CLINICAL DATA:  repeated forceful vomiting EXAM: PORTABLE CHEST 1 VIEW COMPARISON:  June 04, 2023, June 08, 2023 FINDINGS: The cardiomediastinal silhouette is unchanged in contour. No pleural effusion. No pneumothorax. Patchy bibasilar platelike opacities, favored to reflect atelectasis. Low lung volume radiograph. Rounded opacity at the LEFT lateral approximate seventh rib, likely a lytic lesion this level. Gaseous distension of bowel beneath the diaphragm. Possible pathologic fracture of the RIGHT posterior sixth rib, similar compared to prior IMPRESSION: 1. Low lung volume radiograph with bibasilar atelectasis. 2. Rounded opacity at the LEFT lateral approximate seventh rib, likely a lytic lesion. Possible pathologic fracture of the RIGHT posterior sixth rib, similar compared to prior. 3. If clinical concern for intraperitoneal free air, recommend dedicated cross-sectional imaging. Electronically Signed   By: Corean Salter M.D.   On: 10/14/2023 14:18     Labs:   Basic  Metabolic Panel: Recent Labs  Lab 10/14/23 1801 10/15/23 0415 10/16/23 0248 10/17/23 0435 10/18/23 0330 10/19/23 0158  NA  --  135 136 134* 136 136  K  --  3.0* 2.8* 3.1* 2.5* 3.1*  CL  --  96* 98 99 101 106  CO2  --  30 28 23 23 22   GLUCOSE  --  83 72 63* 124* 113*  BUN  --  20 23 17 11 10   CREATININE  --  0.83 0.72 0.71 0.70 0.62  CALCIUM   --  7.7* 7.0* 6.9* 6.7* 7.0*  MG 1.5* 2.0  --  2.1  --  1.9  PHOS  --   --   --  1.8*  --  1.8*   GFR Estimated Creatinine Clearance: 102.5 mL/min (by C-G formula based on SCr of 0.62 mg/dL). Liver Function Tests: Recent Labs  Lab 10/14/23 1406 10/19/23 0158  AST 33 22  ALT 33 26  ALKPHOS 45 51  BILITOT 0.4 0.8  PROT 5.4* 5.3*  ALBUMIN 2.7* 2.4*   No results for input(s): LIPASE, AMYLASE in the last 168 hours. No results for input(s): AMMONIA in the last 168  hours. Coagulation profile Recent Labs  Lab 10/14/23 1406  INR 1.4*    CBC: Recent Labs  Lab 10/14/23 1406 10/14/23 1801 10/16/23 0248 10/17/23 0435 10/18/23 0330 10/19/23 0127 10/20/23 0410  WBC 16.2*   < > 9.7 10.7* 11.9* 15.6* 14.5*  NEUTROABS 14.3*  --  8.2* 9.1* 10.1* 13.5*  --   HGB 10.8*   < > 9.2* 9.7* 9.4* 10.1* 9.3*  HCT 33.6*   < > 27.8* 29.9* 28.3* 30.9* 28.8*  MCV 96.8   < > 97.5 99.7 95.6 96.9 98.3  PLT 260   < > 150 167 169 213 222   < > = values in this interval not displayed.   Cardiac Enzymes: No results for input(s): CKTOTAL, CKMB, CKMBINDEX, TROPONINI in the last 168 hours. BNP: Invalid input(s): POCBNP CBG: No results for input(s): GLUCAP in the last 168 hours. D-Dimer No results for input(s): DDIMER in the last 72 hours. Hgb A1c No results for input(s): HGBA1C in the last 72 hours. Lipid Profile No results for input(s): CHOL, HDL, LDLCALC, TRIG, CHOLHDL, LDLDIRECT in the last 72 hours. Thyroid  function studies No results for input(s): TSH, T4TOTAL, T3FREE, THYROIDAB in the last 72 hours.  Invalid input(s): FREET3 Anemia work up No results for input(s): VITAMINB12, FOLATE, FERRITIN, TIBC, IRON, RETICCTPCT in the last 72 hours. Microbiology No results found for this or any previous visit (from the past 240 hours).  Time coordinating discharge: 45 minutes  Signed: Lysette Lindenbaum  [facility] 10/20/2023, 11:05 AM

## 2023-10-20 NOTE — Plan of Care (Signed)

## 2023-10-20 NOTE — Progress Notes (Signed)
 Physical Therapy Treatment Patient Details Name: David Mcgrath MRN: [id number] DOB: [birth year] Today's Date: 10/20/2023   History of Present Illness 73 y.o. male adm with N/V, 1 episode of coffee ground emesis;  CT = Progressive small bowel distension with suspected transition point in the right hemiabdomen, suspicious for small bowel obstruction.  Pt admitted for SBO on 10/14/23 and also had A-fib with RVR this admission. PMH significant for HTN, HLD, paroxysmal A-fib, neuropathy, fatty liver, recent diagnosis of multiple myeloma 06/2023 currently on chemotherapy    PT Comments  Pt assisted with ambulating however only tolerating short distances at this time due to fatigue and elevated HR.  Pt required seated rest break and use of gait belt.  Spouse reports she has gait belt at home, and pt is currently working with HHPT and plans to return to this upon d/c.   Pt and spouse anticipate d/c home today.  RN notified of HR with activity.   If plan is discharge home, recommend the following: Assistance with cooking/housework;Help with stairs or ramp for entrance   Can travel by private vehicle        Equipment Recommendations  None recommended by PT    Recommendations for Other Services       Precautions / Restrictions Precautions Precautions: Fall Precaution/Restrictions Comments: monitor HR     Mobility  Bed Mobility Overal bed mobility: Needs Assistance Bed Mobility: Supine to Sit     Supine to sit: Contact guard, HOB elevated, Used rails     General bed mobility comments: increased time and effort    Transfers Overall transfer level: Needs assistance Equipment used: Rolling walker (2 wheels) Transfers: Sit to/from Stand Sit to Stand: Contact guard assist, Min assist, From elevated surface           General transfer comment: CGA from elevated surface (however pt also 6'4)  min assist from regular height chair, cues for hand placement     Ambulation/Gait Ambulation/Gait assistance: Contact guard assist Gait Distance (Feet): 25 Feet (x2) Assistive device: Rolling walker (2 wheels) Gait Pattern/deviations: Trunk flexed, Step-through pattern, Decreased stride length, Decreased dorsiflexion - right, Decreased dorsiflexion - left, Shuffle Gait velocity: decreased     General Gait Details: cues for proper distance from RW, posture,  limited foot clearance observed, heavy reliance on B UE support at RW due to fatigue; seated rest break after 25 ft due to fatigue and elevated HR (up to 150 bpm on telemetry, RN notified)   Stairs             Wheelchair Mobility     Tilt Bed    Modified Rankin (Stroke Patients Only)       Balance Overall balance assessment: Mild deficits observed, not formally tested                                          Communication Communication Communication: No apparent difficulties  Cognition Arousal: Alert Behavior During Therapy: WFL for tasks assessed/performed   PT - Cognitive impairments: No apparent impairments                         Following commands: Intact      Cueing Cueing Techniques: Verbal cues  Exercises      General Comments        Pertinent Vitals/Pain Pain Assessment Pain Assessment: No/denies pain  Home Living                          Prior Function            PT Goals (current goals can now be found in the care plan section) Progress towards PT goals: Progressing toward goals    Frequency    Min 2X/week      PT Plan      Co-evaluation              AM-PAC PT 6 Clicks Mobility   Outcome Measure  Help needed turning from your back to your side while in a flat bed without using bedrails?: A Little Help needed moving from lying on your back to sitting on the side of a flat bed without using bedrails?: A Little Help needed moving to and from a bed to a chair (including a wheelchair)?: A  Little Help needed standing up from a chair using your arms (e.g., wheelchair or bedside chair)?: A Little Help needed to walk in hospital room?: A Little Help needed climbing 3-5 steps with a railing? : A Lot 6 Click Score: 17    End of Session Equipment Utilized During Treatment: Gait belt Activity Tolerance: Patient limited by fatigue Patient left: in chair;with call bell/phone within reach;with family/visitor present Nurse Communication: Mobility status (notified of elevated HR with activity) PT Visit Diagnosis: Other abnormalities of gait and mobility (R26.89)     Time: 8862-8841 PT Time Calculation (min) (ACUTE ONLY): 21 min  Charges:    $Gait Training: 8-22 mins PT General Charges $$ ACUTE PT VISIT: 1 Visit                    Tari KLEIN, DPT Physical Therapist Acute Rehabilitation Services Office: [phone]   [name] 10/20/2023, 1:13 PM

## 2023-10-23 DIAGNOSIS — K219 Gastro-esophageal reflux disease without esophagitis: Secondary | ICD-10-CM | POA: Diagnosis not present

## 2023-10-23 DIAGNOSIS — K760 Fatty (change of) liver, not elsewhere classified: Secondary | ICD-10-CM | POA: Diagnosis not present

## 2023-10-23 DIAGNOSIS — M899 Disorder of bone, unspecified: Secondary | ICD-10-CM | POA: Diagnosis not present

## 2023-10-23 DIAGNOSIS — M109 Gout, unspecified: Secondary | ICD-10-CM | POA: Diagnosis not present

## 2023-10-23 DIAGNOSIS — F419 Anxiety disorder, unspecified: Secondary | ICD-10-CM | POA: Diagnosis not present

## 2023-10-23 DIAGNOSIS — Z7901 Long term (current) use of anticoagulants: Secondary | ICD-10-CM | POA: Diagnosis not present

## 2023-10-23 DIAGNOSIS — I10 Essential (primary) hypertension: Secondary | ICD-10-CM | POA: Diagnosis not present

## 2023-10-23 DIAGNOSIS — G603 Idiopathic progressive neuropathy: Secondary | ICD-10-CM | POA: Diagnosis not present

## 2023-10-23 DIAGNOSIS — G4733 Obstructive sleep apnea (adult) (pediatric): Secondary | ICD-10-CM | POA: Diagnosis not present

## 2023-10-23 DIAGNOSIS — Z85828 Personal history of other malignant neoplasm of skin: Secondary | ICD-10-CM | POA: Diagnosis not present

## 2023-10-23 DIAGNOSIS — E785 Hyperlipidemia, unspecified: Secondary | ICD-10-CM | POA: Diagnosis not present

## 2023-10-26 ENCOUNTER — Inpatient Hospital Stay

## 2023-10-26 ENCOUNTER — Inpatient Hospital Stay: Admitting: Hematology and Oncology

## 2023-10-26 VITALS — BP 120/71 | HR 87 | Temp 97.3°F | Resp 14 | Wt 205.6 lb

## 2023-10-26 DIAGNOSIS — C9000 Multiple myeloma not having achieved remission: Secondary | ICD-10-CM | POA: Diagnosis not present

## 2023-10-26 DIAGNOSIS — Z7961 Long term (current) use of immunomodulator: Secondary | ICD-10-CM | POA: Diagnosis not present

## 2023-10-26 DIAGNOSIS — Z5112 Encounter for antineoplastic immunotherapy: Secondary | ICD-10-CM | POA: Diagnosis not present

## 2023-10-26 DIAGNOSIS — G629 Polyneuropathy, unspecified: Secondary | ICD-10-CM | POA: Diagnosis not present

## 2023-10-26 DIAGNOSIS — M898X9 Other specified disorders of bone, unspecified site: Secondary | ICD-10-CM | POA: Diagnosis not present

## 2023-10-26 DIAGNOSIS — Z7901 Long term (current) use of anticoagulants: Secondary | ICD-10-CM | POA: Diagnosis not present

## 2023-10-26 DIAGNOSIS — Z79899 Other long term (current) drug therapy: Secondary | ICD-10-CM | POA: Diagnosis not present

## 2023-10-26 DIAGNOSIS — Z79624 Long term (current) use of inhibitors of nucleotide synthesis: Secondary | ICD-10-CM | POA: Diagnosis not present

## 2023-10-26 LAB — CMP (CANCER CENTER ONLY)
ALT: 24 U/L (ref 0–44)
AST: 26 U/L (ref 15–41)
Albumin: 2.8 g/dL — ABNORMAL LOW (ref 3.5–5.0)
Alkaline Phosphatase: 55 U/L (ref 38–126)
Anion gap: 7 (ref 5–15)
BUN: 8 mg/dL (ref 8–23)
CO2: 22 mmol/L (ref 22–32)
Calcium: 7.5 mg/dL — ABNORMAL LOW (ref 8.9–10.3)
Chloride: 105 mmol/L (ref 98–111)
Creatinine: 0.73 mg/dL (ref 0.61–1.24)
GFR, Estimated: >60 mL/min (ref >60)
Glucose, Bld: 137 mg/dL — ABNORMAL HIGH (ref 70–99)
Potassium: 4.5 mmol/L (ref 3.5–5.1)
Sodium: 134 mmol/L — ABNORMAL LOW (ref 135–145)
Total Bilirubin: 0.3 mg/dL (ref 0.0–1.2)
Total Protein: 5.4 g/dL — ABNORMAL LOW (ref 6.5–8.1)

## 2023-10-26 LAB — CBC WITH DIFFERENTIAL (CANCER CENTER ONLY)
Abs Immature Granulocytes: 0.02 K/uL (ref 0.00–0.07)
Basophils Absolute: 0 K/uL (ref 0.0–0.1)
Basophils Relative: 0 %
Eosinophils Absolute: 0 K/uL (ref 0.0–0.5)
Eosinophils Relative: 0 %
HCT: 30.1 % — ABNORMAL LOW (ref 39.0–52.0)
Hemoglobin: 10.1 g/dL — ABNORMAL LOW (ref 13.0–17.0)
Immature Granulocytes: 0 %
Lymphocytes Relative: 3 %
Lymphs Abs: 0.2 K/uL — ABNORMAL LOW (ref 0.7–4.0)
MCH: 31.4 pg (ref 26.0–34.0)
MCHC: 33.6 g/dL (ref 30.0–36.0)
MCV: 93.5 fL (ref 80.0–100.0)
Monocytes Absolute: 0.2 K/uL (ref 0.1–1.0)
Monocytes Relative: 2 %
Neutro Abs: 7 K/uL (ref 1.7–7.7)
Neutrophils Relative %: 95 %
Platelet Count: 390 K/uL (ref 150–400)
RBC: 3.22 MIL/uL — ABNORMAL LOW (ref 4.22–5.81)
RDW: 16.5 % — ABNORMAL HIGH (ref 11.5–15.5)
WBC Count: 7.5 K/uL (ref 4.0–10.5)
nRBC: 0 % (ref 0.0–0.2)

## 2023-10-26 MED ORDER — BORTEZOMIB CHEMO SQ INJECTION 3.5 MG (2.5MG/ML)
1.3000 mg/m2 | Freq: Once | INTRAMUSCULAR | Status: AC
  Administered 2023-10-26: 3 mg via SUBCUTANEOUS
  Filled 2023-10-26: qty 1.2

## 2023-10-26 NOTE — Patient Instructions (Signed)
 [facility] - A DEPT OF [facility]  Discharge Instructions: Thank you for choosing [facility] to provide your oncology and hematology care.   If you have a lab appointment with the Cancer Center, please go directly to the Cancer Center and check in at the registration area.   Wear comfortable clothing and clothing appropriate for easy access to any Portacath or PICC line.   We strive to give you quality time with your provider. You may need to reschedule your appointment if you arrive late (15 or more minutes).  Arriving late affects you and other patients whose appointments are after yours.  Also, if you miss three or more appointments without notifying the office, you may be dismissed from the clinic at the provider's discretion.      For prescription refill requests, have your pharmacy contact our office and allow 72 hours for refills to be completed.    Today you received the following chemotherapy and/or immunotherapy agents: bortezomib      To help prevent nausea and vomiting after your treatment, we encourage you to take your nausea medication as directed.  BELOW ARE SYMPTOMS THAT SHOULD BE REPORTED IMMEDIATELY: *FEVER GREATER THAN 100.4 F (38 C) OR HIGHER *CHILLS OR SWEATING *NAUSEA AND VOMITING THAT IS NOT CONTROLLED WITH YOUR NAUSEA MEDICATION *UNUSUAL SHORTNESS OF BREATH *UNUSUAL BRUISING OR BLEEDING *URINARY PROBLEMS (pain or burning when urinating, or frequent urination) *BOWEL PROBLEMS (unusual diarrhea, constipation, pain near the anus) TENDERNESS IN MOUTH AND THROAT WITH OR WITHOUT PRESENCE OF ULCERS (sore throat, sores in mouth, or a toothache) UNUSUAL RASH, SWELLING OR PAIN  UNUSUAL VAGINAL DISCHARGE OR ITCHING   Items with * indicate a potential emergency and should be followed up as soon as possible or go to the Emergency Department if any problems should occur.  Please show the CHEMOTHERAPY ALERT CARD or IMMUNOTHERAPY  ALERT CARD at check-in to the Emergency Department and triage nurse.  Should you have questions after your visit or need to cancel or reschedule your appointment, please contact [facility] - [facility] DE[facility]  Dept: [phone]  and follow the prompts.  Office hours are 8:00 a.m. to 4:30 p.m. Monday - Friday. Please note that voicemails left after 4:00 p.m. may not be returned until the following business day.  We are closed weekends and major holidays. You have access to a nurse at all times for urgent questions. Please call the main number to the clinic Dept: [phone] and follow the prompts.   For any non-urgent questions, you may also contact your provider using MyChart. We now offer e-Visits for anyone 52 and older to request care online for non-urgent symptoms. For details visit [email or web address].   Also download the MyChart app! Go to the app store, search "MyChart", open the app, select , and log in with your MyChart username and password.

## 2023-10-26 NOTE — Progress Notes (Signed)
 [facility] [phone]   [phone]  PROGRESS NOTE  Patient Care Team: [name], DO as PCP - General (Internal Medicine) Ladona Heinz, MD as PCP - Cardiology (Cardiology)  Hematological/Oncological History #  IgG Kappa multiple myeloma.    ONCOLOGIC/HEMATOLOGIC HISTORY: 06/08/2023: Underwent CT CAP during admission for acute hypoxic respiratory failure in the setting of Influenza A and CAP vs aspiration pneumonia. Findings showed scattered bony lytic lesions in L 4th rib, pelvis including right ilium and thoracic spine.  06/09/2023: Labs collected during admission includes protein electrophoresis did not observe an M spike. UPEP 24 hour urine showed elevated free kappa light chains (611.43) and free lambda light chains (78.28) with a normal free kappa/lambda ratio.  07/13/2023: Established care with [facility] SPEP detected M protein measuring 0.3 g/dL. IFE showed IgG monoclonal protein with kappa light chain specificity. Serum free light chains showed elevated kappa light chain measuring 512.1, lambda light chain 20.3, ratio 25.23.  07/31/2023: Bone marrow biopsy revealed hypercellular bone marrow with plasma cell neoplasm representing 28% of all cells.  08/17/2023: Cycle 1, Day 1 of Velcade /Dex 09/21/2023: Cycle 2, Day 1 of Velcade /Rev/Dex  Interval History:  David Mcgrath 73 y.o. male with medical history significant for IgG Kappa MM who presents for a follow up visit. The patient's last visit was on 10/05/2023. In the interim since the last visit he has had no major changes in his health.  On exam today David Mcgrath reports he is currently off Revlimid  altogether because after restarting he developed severe nausea and vomiting.  He was hospitalized in the week with intestinal obstruction had NG tube in place.  He notes that his stools are a little soft but not like water.  He reports that his energy levels have not particularly  rebounded well since his last visit.  He reports that he does feel like he is getting better every day.  He notes that his desire to eat is there though his weight continues to drop.  He is down to 205 pounds.  He notes that otherwise he has been at his baseline level of health with no fevers, chills, sweats, nausea, vomiting.  Full 10 point ROS is otherwise negative.  Overall he is willing and able to continue on Velcade  therapy at this time with consideration of restarting Revlimid  in the future.  MEDICAL HISTORY:  Past Medical History:  Diagnosis Date   Acute hypoxic respiratory failure (HCC) 06/04/2023   AKI (acute kidney injury) (HCC) 06/04/2023   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/04/2018   Depression 02/25/2014   Managed well with Prozac  20 mg po daily.  Patient reports he does not have symptoms as of 02/25/14.     Diarrhea 06/04/2023   Essential hypertension 06/04/2023   Fatty liver 06/04/2023   Flu 06/04/2023   Hyperlipidemia 06/04/2023   Ileus (HCC) 06/04/2023   Neuropathy    Paroxysmal atrial fibrillation (HCC) 07/04/2023   Pneumonia 06/04/2023    SURGICAL HISTORY: Past Surgical History:  Procedure Laterality Date   IR BONE MARROW BIOPSY & ASPIRATION  07/31/2023   NO PAST SURGERIES      SOCIAL HISTORY: Social History   Socioeconomic History   Marital status: Married    Spouse name: Almarie   Number of children: 0   Years of education: Not on file   Highest education level: Not on file  Occupational History   Occupation: Event organiser  Tobacco Use   Smoking status: Never  Smokeless tobacco: Never  Substance and Sexual Activity   Alcohol  use: Yes    Comment: socialy   Drug use: No   Sexual activity: Not on file  Other Topics Concern   Not on file  Social History Narrative   Lives with spouse   Right handed   Drinks 4+ cups of caffeine daily   Retired    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Corporate investment banker Strain: Not on file  Food Insecurity: No Food  Insecurity (10/14/2023)   Hunger Vital Sign    Worried About Running Out of Food in the Last Year: Never true    Ran Out of Food in the Last Year: Never true  Transportation Needs: No Transportation Needs (10/14/2023)   PRAPARE - Administrator, Civil Service (Medical): No    Lack of Transportation (Non-Medical): No  Physical Activity: Not on file  Stress: Not on file  Social Connections: Socially Integrated (10/14/2023)   Social Connection and Isolation Panel    Frequency of Communication with Friends and Family: More than three times a week    Frequency of Social Gatherings with Friends and Family: Twice a week    Attends Religious Services: 1 to 4 times per year    Active Member of [employer] or Organizations: Yes    Attends Engineer, structural: More than 4 times per year    Marital Status: Married  Catering manager Violence: Not At Risk (10/14/2023)   Humiliation, Afraid, Rape, and Kick questionnaire    Fear of Current or Ex-Partner: No    Emotionally Abused: No    Physically Abused: No    Sexually Abused: No    FAMILY HISTORY: Family History  Problem Relation Age of Onset   Diabetes Mother    Cancer Mother    Heart disease Mother    Cancer Father    Hyperlipidemia Father    Neuropathy Paternal Uncle    Migraines Neg Hx     ALLERGIES:  is allergic to shrimp [shellfish allergy].  MEDICATIONS:  Current Outpatient Medications  Medication Sig Dispense Refill   acyclovir  (ZOVIRAX ) 400 MG tablet Take 1 tablet (400 mg total) by mouth 2 (two) times daily. 60 tablet 3   allopurinol  (ZYLOPRIM ) 300 MG tablet Take 300 mg by mouth daily.     apixaban  (ELIQUIS ) 5 MG TABS tablet Take 1 tablet (5 mg total) by mouth 2 (two) times daily. 60 tablet 12   calcium -vitamin D (OSCAL WITH D) 500-5 MG-MCG tablet Take 1 tablet by mouth 2 (two) times daily. 60 tablet 3   Cholecalciferol (VITAMIN D-3) 125 MCG (5000 UT) TABS Take 5,000 Units by mouth daily.     Cyanocobalamin   (VITAMIN B12) 1000 MCG TBCR Take 1,000 mcg by mouth daily.     dexamethasone  (DECADRON ) 4 MG tablet Take 40 mg by mouth once a week on day of Velcade  injection. (10 tablets) 40 tablet 2   diphenoxylate -atropine  (LOMOTIL ) 2.5-0.025 MG tablet Take 1 tablet by mouth 4 (four) times daily as needed for diarrhea or loose stools. 120 tablet 0   ezetimibe (ZETIA) 10 MG tablet Take 10 mg by mouth every evening.     FLUoxetine  (PROZAC ) 20 MG capsule Take 20 mg by mouth daily.     fluticasone (FLONASE) 50 MCG/ACT nasal spray Place 1 spray into both nostrils daily.     gabapentin  (NEURONTIN ) 300 MG capsule Take 2 capsules (600 mg total) by mouth 2 (two) times daily. (Patient taking differently: Take 600 mg  by mouth 3 (three) times daily.)     guaiFENesin  (MUCINEX ) 600 MG 12 hr tablet Take 600 mg by mouth daily.     guaifenesin  (ROBITUSSIN) 100 MG/5ML syrup Take 200 mg by mouth daily as needed for cough.     lenalidomide  (REVLIMID ) 10 MG capsule Take 1 capsule (10 mg total) by mouth daily. Bertrum Barrows # [id number]     Date Obtained 10/05/23 Take one daily for 21 days then none for 7 days. 21 capsule 0   melatonin 5 MG TABS Take 5 mg by mouth at bedtime.     metoprolol  tartrate (LOPRESSOR ) 25 MG tablet Take 0.5 tablets (12.5 mg total) by mouth daily as needed (For persistent palpitation). 30 tablet 0   omeprazole (PRILOSEC) 40 MG capsule Take 40 mg by mouth daily.     ondansetron  (ZOFRAN ) 8 MG tablet Take 1 tablet (8 mg total) by mouth every 8 (eight) hours as needed for nausea or vomiting. 30 tablet 1   potassium chloride  SA (KLOR-CON  M) 20 MEQ tablet Take 1 tablet (20 mEq total) by mouth 2 (two) times daily. 60 tablet 2   pravastatin (PRAVACHOL) 80 MG tablet Take 80 mg by mouth at bedtime.     prochlorperazine  (COMPAZINE ) 10 MG tablet Take 1 tablet (10 mg total) by mouth every 6 (six) hours as needed for nausea or vomiting. 30 tablet 1   pyridoxine (B-6) 100 MG tablet Take 100 mg by mouth daily.     No current  facility-administered medications for this visit.    REVIEW OF SYSTEMS:   Constitutional: ( - ) fevers, ( - )  chills , ( - ) night sweats Eyes: ( - ) blurriness of vision, ( - ) double vision, ( - ) watery eyes Ears, nose, mouth, throat, and face: ( - ) mucositis, ( - ) sore throat Respiratory: ( - ) cough, ( - ) dyspnea, ( - ) wheezes Cardiovascular: ( - ) palpitation, ( - ) chest discomfort, ( - ) lower extremity swelling Gastrointestinal:  ( - ) nausea, ( - ) heartburn, ( - ) change in bowel habits Skin: ( - ) abnormal skin rashes Lymphatics: ( - ) new lymphadenopathy, ( - ) easy bruising Neurological: ( - ) numbness, ( - ) tingling, ( - ) new weaknesses Behavioral/Psych: ( - ) mood change, ( - ) new changes  All other systems were reviewed with the patient and are negative.  PHYSICAL EXAMINATION:  Vitals:   10/26/23 1525  BP: 120/71  Pulse: 87  Resp: 14  Temp: (!) 97.3 F (36.3 C)  SpO2: 97%      Filed Weights   10/26/23 1525  Weight: 205 lb 9.6 oz (93.3 kg)       GENERAL: Well-appearing elderly Caucasian male, alert, no distress and comfortable SKIN: skin color, texture, turgor are normal, no rashes or significant lesions EYES: conjunctiva are pink and non-injected, sclera clear LUNGS: clear to auscultation and percussion with normal breathing effort HEART: regular rate & rhythm and no murmurs and no lower extremity edema Musculoskeletal: no cyanosis of digits and no clubbing  PSYCH: alert & oriented x 3, fluent speech NEURO: no focal motor/sensory deficits  LABORATORY DATA:  I have reviewed the data as listed    Latest Ref Rng & Units 10/26/2023    2:47 PM 10/20/2023    4:10 AM 10/19/2023    1:27 AM  CBC  WBC 4.0 - 10.5 K/uL 7.5  14.5  15.6   Hemoglobin  13.0 - 17.0 g/dL 89.8  9.3  89.8   Hematocrit 39.0 - 52.0 % 30.1  28.8  30.9   Platelets 150 - 400 K/uL 390  222  213        Latest Ref Rng & Units 10/26/2023    2:47 PM 10/19/2023    1:58 AM 10/18/2023     3:30 AM  CMP  Glucose 70 - 99 mg/dL 862  886  875   BUN 8 - 23 mg/dL 8  10  11    Creatinine 0.61 - 1.24 mg/dL 9.26  9.37  9.29   Sodium 135 - 145 mmol/L 134  136  136   Potassium 3.5 - 5.1 mmol/L 4.5  3.1  2.5   Chloride 98 - 111 mmol/L 105  106  101   CO2 22 - 32 mmol/L 22  22  23    Calcium  8.9 - 10.3 mg/dL 7.5  7.0  6.7   Total Protein 6.5 - 8.1 g/dL 5.4  5.3    Total Bilirubin 0.0 - 1.2 mg/dL 0.3  0.8    Alkaline Phos 38 - 126 U/L 55  51    AST 15 - 41 U/L 26  22    ALT 0 - 44 U/L 24  26      Lab Results  Component Value Date   MPROTEIN Not Observed 09/28/2023   MPROTEIN Not Observed 09/07/2023   MPROTEIN Not Observed 08/17/2023   Lab Results  Component Value Date   KPAFRELGTCHN 51.7 (H) 10/26/2023   KPAFRELGTCHN 48.0 (H) 09/28/2023   KPAFRELGTCHN 41.8 (H) 09/07/2023   LAMBDASER 45.6 (H) 10/26/2023   LAMBDASER 31.4 (H) 09/28/2023   LAMBDASER 17.4 09/07/2023   KAPLAMBRATIO 1.13 10/26/2023   KAPLAMBRATIO 1.53 09/28/2023   KAPLAMBRATIO 2.40 (H) 09/07/2023   RADIOGRAPHIC STUDIES: DG Abd Portable 1V Result Date: 10/18/2023 CLINICAL DATA:  73 year old male with suspected small-bowel obstruction on recent CTA. EXAM: PORTABLE ABDOMEN - 1 VIEW COMPARISON:  Abdominal radiographs yesterday and earlier. FINDINGS: Portable AP supine view at 0752 hours. Enteric tube now is visible in the left upper quadrant, terminating in the stomach. Ongoing gas distended bowel loops in the abdomen and pelvis, most of which appears to be small bowel. Mid abdominal small bowel loops remain dilated approximately 5.5 cm diameter. Bowel-gas pattern has not significantly improved compared to the CTA on 10/14/2023. No pneumoperitoneum is evident on these supine views. Stable visualized osseous structures. IMPRESSION: 1. Enteric tube terminates in the stomach. 2. No improvement in gas distended mostly small bowel loops in the abdomen and pelvis. Pattern currently favors small-bowel obstruction over ileus.  Electronically Signed   By: VEAR Hurst M.D.   On: 10/18/2023 08:57   DG Abd Portable 1V Result Date: 10/17/2023 CLINICAL DATA:  73 year old male with suspected small-bowel obstruction on CTA abdomen and pelvis 3 days ago. EXAM: PORTABLE ABDOMEN - 1 VIEW COMPARISON:  10/16/2023 radiographs and earlier. FINDINGS: Portable AP supine view at 0618 hours. Retained stool in the rectum has increased since the prior CT. Bowel-gas pattern otherwise not significantly changed since that time. Ileus versus small-bowel obstruction. No pneumoperitoneum identified on these supine views. Stable visualized osseous structures. IMPRESSION: Stable gas pattern suggesting ileus versus small-bowel obstruction. Increased retained stool in the rectum from the recent CT. Electronically Signed   By: VEAR Hurst M.D.   On: 10/17/2023 10:10   DG Abd Portable 1V Result Date: 10/16/2023 CLINICAL DATA:  Small-bowel obstruction EXAM: PORTABLE ABDOMEN - 1 VIEW COMPARISON:  Abdominal radiograph  dated 10/15/2023 FINDINGS: Partially imaged enteric tube tip projects over the left upper quadrant, likely within the stomach. Similar diffuse gas-filled bowel dilation throughout the abdomen. Gas is seen at the level of the rectum. IMPRESSION: Similar diffuse gas-filled bowel dilation throughout the abdomen, which may represent small-bowel obstruction or ileus. Electronically Signed   By: Limin  Xu M.D.   On: 10/16/2023 10:55   DG Abd Portable 1V-Small Bowel Obstruction Protocol-initial, 8 hr delay Result Date: 10/15/2023 CLINICAL DATA:  Small-bowel obstruction-8 hour delay EXAM: PORTABLE ABDOMEN - 1 VIEW COMPARISON:  CT abdomen pelvis 10/14/2023 FINDINGS: Persistent dilation of the small bowel. Contrast is not definitively visualized the on the stomach. Contrast in the bladder from prior CT. IMPRESSION: Persistent small bowel obstruction. Electronically Signed   By: Norman Gatlin M.D.   On: 10/15/2023 18:39   CT Angio Abd/Pel W and/or Wo  Contrast Result Date: 10/14/2023 CLINICAL DATA:  Lower GI bleed. Coffee ground emesis. History of multiple myeloma. EXAM: CTA ABDOMEN AND PELVIS WITHOUT AND WITH CONTRAST TECHNIQUE: Multidetector CT imaging of the abdomen and pelvis was performed using the standard protocol during bolus administration of intravenous contrast. Multiplanar reconstructed images and MIPs were obtained and reviewed to evaluate the vascular anatomy. RADIATION DOSE REDUCTION: This exam was performed according to the departmental dose-optimization program which includes automated exposure control, adjustment of the mA and/or kV according to patient size and/or use of iterative reconstruction technique. CONTRAST:  OMNIPAQUE  IOHEXOL  350 MG/ML SOLN COMPARISON:  CT 06/08/2023 FINDINGS: VASCULAR Aorta: Normal caliber aorta without aneurysm, dissection, vasculitis or significant stenosis. Moderate aortic atherosclerosis. Celiac: Dense plaque at the origin causes high-grade stenosis with mild poststenotic dilatation. No dissection or aneurysm. SMA: Patent without evidence of aneurysm, dissection, vasculitis or significant stenosis. Renals: Mild plaque at the origin of both renal arteries without significant stenosis. Both renal arteries are patent without evidence of aneurysm, dissection, vasculitis, or fibromuscular dysplasia. IMA: Patent without evidence of aneurysm, dissection, vasculitis or significant stenosis. Inflow: Coarse plaque at the origin of the common iliac arteries causes less than 50% stenosis. Distal branches are patent. Proximal Outflow: Plaque in the left femoral artery without severe stenosis. No dissection or acute findings. Veins: Venous phase imaging demonstrates no acute findings. Patent portal and splenic veins. Patent mesenteric veins. No portal venous or mesenteric gas. Review of the MIP images confirms the above findings. NON-VASCULAR Lower chest: Minimal left pleural thickening is unchanged from prior exam.  Dependent ground-glass opacities may represent hypoventilatory change or infection. Hepatobiliary: No focal liver abnormality is seen. No gallstones, gallbladder wall thickening, or biliary dilatation. Pancreas: No ductal dilatation or inflammation. Spleen: Normal in size without focal abnormality. Adrenals/Urinary Tract: Normal adrenal glands. No hydronephrosis or renal calculi. No suspicious renal abnormality. The urinary bladder is partially distended, mildly thick walled. Stomach/Bowel: No contrast accumulation in the GI tract to localize site of GI bleed. Progressive small bowel distension with suspected transition point in the right hemiabdomen, series 8, image 97. There is adjacent mesenteric edema. Small bowel distal to this is decompressed. Small bowel proximally is dilated and fluid-filled. The stomach is dilated and fluid-filled. Fluid distention of the included distal esophagus. The transverse colon is tortuous, courses posterior to the stomach. Question of underlying mesenteric defect in internal hernia, but no wall thickening or obstruction at this point. Mild left colonic diverticulosis without diverticulitis. The appendix is not confidently visualized. Lymphatic: No enlarged lymph nodes in the abdomen or pelvis. Reproductive: Prostate is unremarkable. Other: Small volume abdominopelvic ascites, new  from prior exam. Generalized body wall edema. There is fluid within both inguinal canals. No free air or focal fluid collection. Small left lumbar hernia contains only fat. Musculoskeletal: Scattered lytic lesions are again seen, not significantly changed. No evidence of pathologic fracture or compression deformity. Multilevel degenerative change in the spine. IMPRESSION: 1. No contrast accumulation in the GI tract to localize site of GI bleed. 2. Progressive small bowel distension with suspected transition point in the right hemiabdomen, suspicious for small bowel obstruction. 3. Question of internal  hernia with transverse colon coursing posterior to the stomach, but no inflammation or obstructive change at this site. 4. Small volume abdominopelvic ascites, new from prior exam. Generalized body wall edema/anasarca. 5. Dependent ground-glass opacities in the lung bases may represent hypoventilatory change or infection. Aspiration is also considered. 6. Known lytic lesions, without significant change. Aortic Atherosclerosis (ICD10-I70.0). Electronically Signed   By: Andrea Gasman M.D.   On: 10/14/2023 15:58   DG Chest Portable 1 View Result Date: 10/14/2023 CLINICAL DATA:  repeated forceful vomiting EXAM: PORTABLE CHEST 1 VIEW COMPARISON:  June 04, 2023, June 08, 2023 FINDINGS: The cardiomediastinal silhouette is unchanged in contour. No pleural effusion. No pneumothorax. Patchy bibasilar platelike opacities, favored to reflect atelectasis. Low lung volume radiograph. Rounded opacity at the LEFT lateral approximate seventh rib, likely a lytic lesion this level. Gaseous distension of bowel beneath the diaphragm. Possible pathologic fracture of the RIGHT posterior sixth rib, similar compared to prior IMPRESSION: 1. Low lung volume radiograph with bibasilar atelectasis. 2. Rounded opacity at the LEFT lateral approximate seventh rib, likely a lytic lesion. Possible pathologic fracture of the RIGHT posterior sixth rib, similar compared to prior. 3. If clinical concern for intraperitoneal free air, recommend dedicated cross-sectional imaging. Electronically Signed   By: Corean Salter M.D.   On: 10/14/2023 14:18    ASSESSMENT & PLAN David Mcgrath 73 y.o. male with medical history significant for IgG Kappa MM who presents for a follow up visit.   #IgG Kappa Multiple Myeloma: -Initially presented with scattered bony lytic lesions on CT CAP from 06/08/2023.  -Baseline labs from 07/13/2023 included SPEP detecting M protein measuring 0.3 g/dL. Kappa light chain elevated to 512.1, lambda light chain  20.3, ratio 25.23.  -Bone met survey from 07/20/2023 showed rxpansile lucent lesions in the right T1 transverse process, posterior right sixth rib, and left posteromedial fourth rib -Bone marrow biopsy confirmed plasma cell neoplasm measuring 28% of all cells.  -Recommend Velcade  plus Dexamethasone  therapy, started on 08/17/2023. Added Revlimid  during Cycle 2  PLAN: --Due for Cycle 3, Day 8 of Velcade /Rev/Dexamethasone  today.  Revlimid  added with cycle 2 --Labs from today were reviewed and adequate for treatment. WBC 7.5, hemoglobin 10.1, MCV 93.5, platelets 390 --Proceed with weekly treatments, dropped Velcade  to 1 mg/m due to diarrhea --Currently patient is receiving 40 mg p.o. Dex on treatment days (is experiencing insomnia)  --revlimid  25 mg PO on pause.  Revlimid  10 stopped after hospitalization for intestinal obstruction.  Will reassess at next visit and determine if we can restart. --RTC in 2 weeks for a toxicity check   #Diarrhea -- Modest improvement with dose reduction, patient not taking Lomotil  -- If we are unable to control his diarrhea could consider discontinuing Velcade  and transitioning to Darzalex -- Patient maintains good appetite and hydration. -- Continue to monitor  # Peripheral neuropathy: --Evaluated by neurology before MM diagnosis.  --EMG nerve conduction study done on 01/30/2020 by Dr. Margaret confirms severe axonal sensorimotor polyneuropathy  as well as moderate right carpal tunnel.  --Currently on gabapentin  600 mg TID.  --Monitor closely with initiation of velcade  as neuropathy can worsen.    #Supportive Care -- chemotherapy education to be scheduled  -- zofran  8mg  q8H PRN and compazine  10mg  PO q6H for nausea -- acyclovir  400mg  PO BID for VCZ prophylaxis --  added Zometa , we have dental clearance on file. First dose on 09/07/2023.  Due again in September 2025 -- no pain medication required at this time.    #Age related screenings -Colorectal screening  UTD. -PSA from 07/13/2023 was normal at 0.4.   No orders of the defined types were placed in this encounter.   All questions were answered. The patient knows to call the clinic with any problems, questions or concerns.  A total of more than 30 minutes were spent on this encounter with face-to-face time and non-face-to-face time, including preparing to see the patient, ordering tests and/or medications, counseling the patient and coordination of care as outlined above.   David IVAR Kidney, MD Department of Hematology/Oncology [facility] Phone: [phone] Pager: [phone] Email: [email or web address]  10/29/2023 11:05 PM

## 2023-10-27 DIAGNOSIS — F419 Anxiety disorder, unspecified: Secondary | ICD-10-CM | POA: Diagnosis not present

## 2023-10-27 DIAGNOSIS — G4733 Obstructive sleep apnea (adult) (pediatric): Secondary | ICD-10-CM | POA: Diagnosis not present

## 2023-10-27 DIAGNOSIS — K760 Fatty (change of) liver, not elsewhere classified: Secondary | ICD-10-CM | POA: Diagnosis not present

## 2023-10-27 DIAGNOSIS — I10 Essential (primary) hypertension: Secondary | ICD-10-CM | POA: Diagnosis not present

## 2023-10-27 DIAGNOSIS — C9000 Multiple myeloma not having achieved remission: Secondary | ICD-10-CM | POA: Diagnosis not present

## 2023-10-27 DIAGNOSIS — Z85828 Personal history of other malignant neoplasm of skin: Secondary | ICD-10-CM | POA: Diagnosis not present

## 2023-10-27 DIAGNOSIS — Z7901 Long term (current) use of anticoagulants: Secondary | ICD-10-CM | POA: Diagnosis not present

## 2023-10-27 DIAGNOSIS — M109 Gout, unspecified: Secondary | ICD-10-CM | POA: Diagnosis not present

## 2023-10-27 DIAGNOSIS — K219 Gastro-esophageal reflux disease without esophagitis: Secondary | ICD-10-CM | POA: Diagnosis not present

## 2023-10-27 DIAGNOSIS — M899 Disorder of bone, unspecified: Secondary | ICD-10-CM | POA: Diagnosis not present

## 2023-10-27 DIAGNOSIS — G603 Idiopathic progressive neuropathy: Secondary | ICD-10-CM | POA: Diagnosis not present

## 2023-10-27 DIAGNOSIS — E785 Hyperlipidemia, unspecified: Secondary | ICD-10-CM | POA: Diagnosis not present

## 2023-10-27 LAB — KAPPA/LAMBDA LIGHT CHAINS
Kappa free light chain: 51.7 mg/L — ABNORMAL HIGH (ref 3.3–19.4)
Kappa, lambda light chain ratio: 1.13 (ref 0.26–1.65)
Lambda free light chains: 45.6 mg/L — ABNORMAL HIGH (ref 5.7–26.3)

## 2023-10-29 ENCOUNTER — Encounter: Payer: Self-pay | Admitting: Hematology and Oncology

## 2023-10-29 LAB — MULTIPLE MYELOMA PANEL, SERUM
Albumin SerPl Elph-Mcnc: 2.7 g/dL — ABNORMAL LOW (ref 2.9–4.4)
Albumin/Glob SerPl: 1.1 (ref 0.7–1.7)
Alpha 1: 0.2 g/dL (ref 0.0–0.4)
Alpha2 Glob SerPl Elph-Mcnc: 0.9 g/dL (ref 0.4–1.0)
B-Globulin SerPl Elph-Mcnc: 0.6 g/dL — ABNORMAL LOW (ref 0.7–1.3)
Gamma Glob SerPl Elph-Mcnc: 0.9 g/dL (ref 0.4–1.8)
Globulin, Total: 2.6 g/dL (ref 2.2–3.9)
IgA: 194 mg/dL (ref 61–437)
IgG (Immunoglobin G), Serum: 841 mg/dL (ref 603–1613)
IgM (Immunoglobulin M), Srm: 94 mg/dL (ref 15–143)
Total Protein ELP: 5.3 g/dL — ABNORMAL LOW (ref 6.0–8.5)

## 2023-10-30 ENCOUNTER — Telehealth: Payer: Self-pay | Admitting: *Deleted

## 2023-10-30 NOTE — Telephone Encounter (Signed)
 Received call from pt's wife, Vertell. She states that her husband restarted his Lenalidamide last Thursday and his diarrhea has returned almost immediately. He has diarrhea 3 x a day. He is weak and using w/c in the house. He is now refusing to take it. Advised that I would let Dr. Federico know of the situation and that I would call back if there was a change in David Mcgrath's treatment plan. Vertell voiced understanding.

## 2023-11-01 ENCOUNTER — Other Ambulatory Visit: Payer: Self-pay

## 2023-11-01 ENCOUNTER — Inpatient Hospital Stay

## 2023-11-01 ENCOUNTER — Inpatient Hospital Stay (HOSPITAL_BASED_OUTPATIENT_CLINIC_OR_DEPARTMENT_OTHER): Admitting: Physician Assistant

## 2023-11-01 ENCOUNTER — Telehealth: Payer: Self-pay | Admitting: *Deleted

## 2023-11-01 ENCOUNTER — Other Ambulatory Visit: Payer: Self-pay | Admitting: *Deleted

## 2023-11-01 VITALS — BP 126/61 | HR 68 | Temp 97.6°F | Resp 18

## 2023-11-01 VITALS — BP 128/73 | HR 63 | Resp 16

## 2023-11-01 DIAGNOSIS — Z6824 Body mass index (BMI) 24.0-24.9, adult: Secondary | ICD-10-CM | POA: Diagnosis not present

## 2023-11-01 DIAGNOSIS — I10 Essential (primary) hypertension: Secondary | ICD-10-CM | POA: Diagnosis not present

## 2023-11-01 DIAGNOSIS — R197 Diarrhea, unspecified: Secondary | ICD-10-CM

## 2023-11-01 DIAGNOSIS — C9000 Multiple myeloma not having achieved remission: Secondary | ICD-10-CM

## 2023-11-01 DIAGNOSIS — R112 Nausea with vomiting, unspecified: Secondary | ICD-10-CM | POA: Diagnosis not present

## 2023-11-01 DIAGNOSIS — R140 Abdominal distension (gaseous): Secondary | ICD-10-CM | POA: Diagnosis not present

## 2023-11-01 DIAGNOSIS — E860 Dehydration: Secondary | ICD-10-CM | POA: Diagnosis not present

## 2023-11-01 DIAGNOSIS — I4891 Unspecified atrial fibrillation: Secondary | ICD-10-CM | POA: Diagnosis not present

## 2023-11-01 DIAGNOSIS — F32A Depression, unspecified: Secondary | ICD-10-CM | POA: Diagnosis not present

## 2023-11-01 DIAGNOSIS — Z79899 Other long term (current) drug therapy: Secondary | ICD-10-CM | POA: Diagnosis not present

## 2023-11-01 DIAGNOSIS — E440 Moderate protein-calorie malnutrition: Secondary | ICD-10-CM | POA: Diagnosis not present

## 2023-11-01 DIAGNOSIS — K56609 Unspecified intestinal obstruction, unspecified as to partial versus complete obstruction: Secondary | ICD-10-CM | POA: Diagnosis not present

## 2023-11-01 DIAGNOSIS — K760 Fatty (change of) liver, not elsewhere classified: Secondary | ICD-10-CM | POA: Diagnosis not present

## 2023-11-01 DIAGNOSIS — K5289 Other specified noninfective gastroenteritis and colitis: Secondary | ICD-10-CM | POA: Diagnosis not present

## 2023-11-01 DIAGNOSIS — G4733 Obstructive sleep apnea (adult) (pediatric): Secondary | ICD-10-CM | POA: Diagnosis not present

## 2023-11-01 DIAGNOSIS — Z7952 Long term (current) use of systemic steroids: Secondary | ICD-10-CM | POA: Diagnosis not present

## 2023-11-01 DIAGNOSIS — K5669 Other partial intestinal obstruction: Secondary | ICD-10-CM | POA: Diagnosis not present

## 2023-11-01 DIAGNOSIS — Z83438 Family history of other disorder of lipoprotein metabolism and other lipidemia: Secondary | ICD-10-CM | POA: Diagnosis not present

## 2023-11-01 DIAGNOSIS — Z8249 Family history of ischemic heart disease and other diseases of the circulatory system: Secondary | ICD-10-CM | POA: Diagnosis not present

## 2023-11-01 DIAGNOSIS — K529 Noninfective gastroenteritis and colitis, unspecified: Secondary | ICD-10-CM | POA: Diagnosis not present

## 2023-11-01 DIAGNOSIS — E876 Hypokalemia: Secondary | ICD-10-CM

## 2023-11-01 DIAGNOSIS — Z91013 Allergy to seafood: Secondary | ICD-10-CM | POA: Diagnosis not present

## 2023-11-01 DIAGNOSIS — N3289 Other specified disorders of bladder: Secondary | ICD-10-CM | POA: Diagnosis not present

## 2023-11-01 DIAGNOSIS — Z8579 Personal history of other malignant neoplasms of lymphoid, hematopoietic and related tissues: Secondary | ICD-10-CM | POA: Diagnosis not present

## 2023-11-01 DIAGNOSIS — K219 Gastro-esophageal reflux disease without esophagitis: Secondary | ICD-10-CM | POA: Diagnosis not present

## 2023-11-01 DIAGNOSIS — F419 Anxiety disorder, unspecified: Secondary | ICD-10-CM | POA: Diagnosis not present

## 2023-11-01 DIAGNOSIS — G5601 Carpal tunnel syndrome, right upper limb: Secondary | ICD-10-CM | POA: Diagnosis not present

## 2023-11-01 DIAGNOSIS — E785 Hyperlipidemia, unspecified: Secondary | ICD-10-CM | POA: Diagnosis not present

## 2023-11-01 DIAGNOSIS — Z7901 Long term (current) use of anticoagulants: Secondary | ICD-10-CM | POA: Diagnosis not present

## 2023-11-01 DIAGNOSIS — Z8701 Personal history of pneumonia (recurrent): Secondary | ICD-10-CM | POA: Diagnosis not present

## 2023-11-01 DIAGNOSIS — G629 Polyneuropathy, unspecified: Secondary | ICD-10-CM | POA: Diagnosis not present

## 2023-11-01 DIAGNOSIS — Z8719 Personal history of other diseases of the digestive system: Secondary | ICD-10-CM | POA: Diagnosis not present

## 2023-11-01 DIAGNOSIS — I480 Paroxysmal atrial fibrillation: Secondary | ICD-10-CM | POA: Diagnosis not present

## 2023-11-01 DIAGNOSIS — K56699 Other intestinal obstruction unspecified as to partial versus complete obstruction: Secondary | ICD-10-CM | POA: Diagnosis not present

## 2023-11-01 DIAGNOSIS — R188 Other ascites: Secondary | ICD-10-CM | POA: Diagnosis not present

## 2023-11-01 DIAGNOSIS — R935 Abnormal findings on diagnostic imaging of other abdominal regions, including retroperitoneum: Secondary | ICD-10-CM | POA: Diagnosis not present

## 2023-11-01 LAB — CMP (CANCER CENTER ONLY)
ALT: 31 U/L (ref 0–44)
AST: 27 U/L (ref 15–41)
Albumin: 2.8 g/dL — ABNORMAL LOW (ref 3.5–5.0)
Alkaline Phosphatase: 57 U/L (ref 38–126)
Anion gap: 11 (ref 5–15)
BUN: 11 mg/dL (ref 8–23)
CO2: 22 mmol/L (ref 22–32)
Calcium: 6.6 mg/dL — ABNORMAL LOW (ref 8.9–10.3)
Chloride: 102 mmol/L (ref 98–111)
Creatinine: 0.85 mg/dL (ref 0.61–1.24)
GFR, Estimated: >60 mL/min (ref >60)
Glucose, Bld: 102 mg/dL — ABNORMAL HIGH (ref 70–99)
Potassium: 3 mmol/L — ABNORMAL LOW (ref 3.5–5.1)
Sodium: 135 mmol/L (ref 135–145)
Total Bilirubin: 0.5 mg/dL (ref 0.0–1.2)
Total Protein: 5.4 g/dL — ABNORMAL LOW (ref 6.5–8.1)

## 2023-11-01 LAB — CBC WITH DIFFERENTIAL (CANCER CENTER ONLY)
Abs Immature Granulocytes: 0.07 K/uL (ref 0.00–0.07)
Basophils Absolute: 0 K/uL (ref 0.0–0.1)
Basophils Relative: 0 %
Eosinophils Absolute: 0.1 K/uL (ref 0.0–0.5)
Eosinophils Relative: 1 %
HCT: 35.2 % — ABNORMAL LOW (ref 39.0–52.0)
Hemoglobin: 12.2 g/dL — ABNORMAL LOW (ref 13.0–17.0)
Immature Granulocytes: 1 %
Lymphocytes Relative: 8 %
Lymphs Abs: 1 K/uL (ref 0.7–4.0)
MCH: 31.4 pg (ref 26.0–34.0)
MCHC: 34.7 g/dL (ref 30.0–36.0)
MCV: 90.7 fL (ref 80.0–100.0)
Monocytes Absolute: 0.6 K/uL (ref 0.1–1.0)
Monocytes Relative: 5 %
Neutro Abs: 10.6 K/uL — ABNORMAL HIGH (ref 1.7–7.7)
Neutrophils Relative %: 85 %
Platelet Count: 316 K/uL (ref 150–400)
RBC: 3.88 MIL/uL — ABNORMAL LOW (ref 4.22–5.81)
RDW: 16.5 % — ABNORMAL HIGH (ref 11.5–15.5)
WBC Count: 12.5 K/uL — ABNORMAL HIGH (ref 4.0–10.5)
nRBC: 0 % (ref 0.0–0.2)

## 2023-11-01 LAB — MAGNESIUM: Magnesium: 1 mg/dL — ABNORMAL LOW (ref 1.7–2.4)

## 2023-11-01 MED ORDER — FAMOTIDINE IN NACL 20-0.9 MG/50ML-% IV SOLN
20.0000 mg | Freq: Once | INTRAVENOUS | Status: AC
  Administered 2023-11-01: 20 mg via INTRAVENOUS
  Filled 2023-11-01: qty 50

## 2023-11-01 MED ORDER — LENALIDOMIDE 10 MG PO CAPS: 10.0000 mg | ORAL_CAPSULE | Freq: Every day | ORAL | 0 refills | Status: DC

## 2023-11-01 MED ORDER — MAGNESIUM SULFATE 4 GM/100ML IV SOLN
4.0000 g | Freq: Once | INTRAVENOUS | Status: AC
  Administered 2023-11-01: 4 g via INTRAVENOUS
  Filled 2023-11-01: qty 100

## 2023-11-01 MED ORDER — SODIUM CHLORIDE 0.9 % IV SOLN: Freq: Once | INTRAVENOUS | Status: AC

## 2023-11-01 MED ORDER — POTASSIUM CHLORIDE 10 MEQ/100ML IV SOLN
10.0000 meq | INTRAVENOUS | Status: AC
  Administered 2023-11-01 (×3): 10 meq via INTRAVENOUS
  Filled 2023-11-01 (×3): qty 100

## 2023-11-01 MED ORDER — ONDANSETRON HCL 4 MG/2ML IJ SOLN
4.0000 mg | Freq: Once | INTRAMUSCULAR | Status: AC
  Administered 2023-11-01: 4 mg via INTRAVENOUS
  Filled 2023-11-01: qty 2

## 2023-11-01 NOTE — Progress Notes (Signed)
 Orders for 11/02/23 placed per Mallie ORN PA-C.

## 2023-11-01 NOTE — Progress Notes (Signed)
 Symptom Management Consult Note David Mcgrath    Patient Care Team: [name], DO as PCP - General (Internal Medicine) Ladona Heinz, MD as PCP - Cardiology (Cardiology)    Name / MRN / DOB: David Mcgrath  [id number]  [birth year]   Date of visit: 11/01/2023   Chief Complaint/Reason for visit: diarrhea    Current Therapy: Velcade /Revlimid /Dexamethasone   Last treatment:  Day 1   Cycle 4 on 10/26/23    ASSESSMENT AND PLAN Patient is a 73 y.o. male with oncologic history of IgG Kappa multiple myeloma followed by Dr. Federico.  I have viewed most recent oncology note and lab work.  #IgG Kappa multiple myeloma  - Next appointment with oncologist is tomorrow 11/02/23  #Diarrhea - Chart review shows patient had recent admission for SBO that resolved with NG tube. Discharge note on 10/20/23 reported bowel function had returned to normal and he was tolerating a soft diet. - Likely related to treatment however will check stool studies to rule out infectious etiology. Patient sent home with collection kit and plans to bring sample in tomorrow. - Patient received 1L IVF in clinic for hydration support. BP improved on recheck  #Nausea and vomiting - Non tender abdomen - 4 mg zofran  administered in clinic. Patient tolerating PO fluids. - Considered SBO given recent history. Patient reports symptoms today feel different than that. Would consider imaging if emesis persists. Patient prefers to monitor symptoms at this time.  #Electrolyte derangement  - Labs today show K 3.0 and magnesium  1.0. Patient received 30 mEq IV potassium and 4 g IV magnesium  in clinic. Patient has PO potassium at home to take. PO magnesium  may need to be prescribed after recheck tomorrow. - Enagaged in shared decision making. Patient feeling improved after IVF and prefers to return to clinic tomorrow to recheck labs and see oncologist and avoid hospital admission at this time. Oncologist updated and  agreeable with plan.   Strict ED precautions discussed should symptoms worsen.   HEME/ONC HISTORY Oncology History  Multiple myeloma (HCC)  08/04/2023 Initial Diagnosis   Multiple myeloma (HCC)   08/17/2023 -  Chemotherapy   Patient is on Treatment Plan : MYELOMA NON-TRANSPLANT CANDIDATES VRd weekly q21d         INTERVAL HISTORY  Discussed the use of AI scribe software for clinical note transcription with the patient, who gave verbal consent to proceed.    David Mcgrath is a 73 y.o. male with oncologic history as above presenting to [facility] today with chief complaint of diarrhea. Accompanied to clinic today by spouse who provides additional history.  Patient had treatment x 6 days ago. His diarrhea started 2 days later and has constant x the last 4 days. He has approximately 3 episodes of diarrhea daily. Stool is liquid and light brown in color. He had a mild episode of lower abdominal pain last night that spontaneously resolved. Nausea started last night and he has had 3 episodes of emesis the spouse describes as looking like bile. He took zofran  around 6 am this morning. He vomited on arrival to clinic and denies any nausea now. He has symptoms of acid reflux which he attributes to the emesis. Denies any fevers or abdominal pain.  Patient restarted his Revlimid  on 07/24 and took it x 3 days. Once he started having diarrhea he stopped the Revlimid  because he felt poorly. He has been drinking water and Gatorade but reports that drinking makes him feel nauseous. He ate  minimal food yesterday due to nausea, including two half cups of applesauce and a bologna sandwich.  He was hospitalized recently for a bowel obstruction, where he had an NG tube placed. The obstruction was thought to have resolved as he began having bowel movements and eating again. He has been home for almost two weeks since discharge. There is no evidence of black or coffee-ground vomit, and he is still having bowel  movements. His current symptoms started after recent treatment.    ROS  All other systems are reviewed and are negative for acute change except as noted in the HPI.    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  Patient reports he does not have symptoms as of 02/25/14.     Diarrhea 06/04/2023   Essential hypertension 06/04/2023   Fatty liver 06/04/2023   Flu 06/04/2023   Hyperlipidemia 06/04/2023   Ileus (HCC) 06/04/2023   Neuropathy    Paroxysmal atrial fibrillation (HCC) 07/04/2023   Pneumonia 06/04/2023     Past Surgical History:  Procedure Laterality Date   IR BONE MARROW BIOPSY & ASPIRATION  07/31/2023   NO PAST SURGERIES      Social History   Socioeconomic History   Marital status: Married    Spouse name: Almarie   Number of children: 0   Years of education: Not on file   Highest education level: Not on file  Occupational History   Occupation: Event organiser  Tobacco Use   Smoking status: Never   Smokeless tobacco: Never  Substance and Sexual Activity   Alcohol  use: Yes    Comment: socialy   Drug use: No   Sexual activity: Not on file  Other Topics Concern   Not on file  Social History Narrative   Lives with spouse   Right handed   Drinks 4+ cups of caffeine daily   Retired    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Corporate investment banker Strain: Not on file  Food Insecurity: No Food Insecurity (10/14/2023)   Hunger Vital Sign    Worried About Running Out of Food in the Last Year: Never true    Ran Out of Food in the Last Year: Never true  Transportation Needs: No Transportation Needs (10/14/2023)   PRAPARE - Scientist, research (physical sciences) (Medical): No    Lack of Transportation (Non-Medical): No  Physical Activity: Not on file  Stress: Not on file  Social Connections: Socially Integrated (10/14/2023)   Social Connection and Isolation Panel    Frequency of Communication with Friends and Family: More than three times a week    Frequency of Social Gatherings with Friends and Family: Twice a week    Attends Religious Services: 1 to 4 times per year    Active Member of [employer] or Organizations: Yes    Attends Engineer, structural: More than 4 times per year    Marital Status: Married  Catering manager Violence: Not At Risk (10/14/2023)   Humiliation, Afraid, Rape, and Kick questionnaire    Fear of Current or Ex-Partner: No    Emotionally Abused: No    Physically Abused: No    Sexually  Abused: No    Family History  Problem Relation Age of Onset   Diabetes Mother    Cancer Mother    Heart disease Mother    Cancer Father    Hyperlipidemia Father    Neuropathy Paternal Uncle    Migraines Neg Hx      Current Outpatient Medications:    acyclovir  (ZOVIRAX ) 400 MG tablet, Take 1 tablet (400 mg total) by mouth 2 (two) times daily., Disp: 60 tablet, Rfl: 3   allopurinol  (ZYLOPRIM ) 300 MG tablet, Take 300 mg by mouth daily., Disp: , Rfl:    apixaban  (ELIQUIS ) 5 MG TABS tablet, Take 1 tablet (5 mg total) by mouth 2 (two) times daily., Disp: 60 tablet, Rfl: 12   calcium -vitamin D (OSCAL WITH D) 500-5 MG-MCG tablet, Take 1 tablet by mouth 2 (two) times daily., Disp: 60 tablet, Rfl: 3   Cholecalciferol (VITAMIN D-3) 125 MCG (5000 UT) TABS, Take 5,000 Units by mouth daily., Disp: , Rfl:    Cyanocobalamin  (VITAMIN B12) 1000 MCG TBCR, Take 1,000 mcg by mouth daily., Disp: , Rfl:    dexamethasone  (DECADRON ) 4 MG tablet, Take 40 mg by mouth once a week on day of Velcade  injection. (10 tablets), Disp: 40 tablet, Rfl: 2   diphenoxylate -atropine  (LOMOTIL ) 2.5-0.025 MG tablet, Take 1 tablet by mouth 4 (four) times daily  as needed for diarrhea or loose stools., Disp: 120 tablet, Rfl: 0   ezetimibe (ZETIA) 10 MG tablet, Take 10 mg by mouth every evening., Disp: , Rfl:    FLUoxetine  (PROZAC ) 20 MG capsule, Take 20 mg by mouth daily., Disp: , Rfl:    fluticasone (FLONASE) 50 MCG/ACT nasal spray, Place 1 spray into both nostrils daily., Disp: , Rfl:    gabapentin  (NEURONTIN ) 300 MG capsule, Take 2 capsules (600 mg total) by mouth 2 (two) times daily. (Patient taking differently: Take 600 mg by mouth 3 (three) times daily.), Disp: , Rfl:    guaiFENesin  (MUCINEX ) 600 MG 12 hr tablet, Take 600 mg by mouth daily., Disp: , Rfl:    guaifenesin  (ROBITUSSIN) 100 MG/5ML syrup, Take 200 mg by mouth daily as needed for cough., Disp: , Rfl:    lenalidomide  (REVLIMID ) 10 MG capsule, Take 1 capsule (10 mg total) by mouth daily. Bertrum Barrows #   [id number]   Date Obtained 11/01/23 Take one daily for 21 days then none for 7 days., Disp: 21 capsule, Rfl: 0   melatonin 5 MG TABS, Take 5 mg by mouth at bedtime., Disp: , Rfl:    metoprolol  tartrate (LOPRESSOR ) 25 MG tablet, Take 0.5 tablets (12.5 mg total) by mouth daily as needed (For persistent palpitation)., Disp: 30 tablet, Rfl: 0   omeprazole (PRILOSEC) 40 MG capsule, Take 40 mg by mouth daily., Disp: , Rfl:    ondansetron  (ZOFRAN ) 8 MG tablet, Take 1 tablet (8 mg total) by mouth every 8 (eight) hours as needed for nausea or vomiting., Disp: 30 tablet, Rfl: 1   potassium chloride  SA (KLOR-CON  M) 20 MEQ tablet, Take 1 tablet (20 mEq total) by mouth 2 (two) times daily., Disp: 60 tablet, Rfl: 2   pravastatin (PRAVACHOL) 80 MG tablet, Take 80 mg by mouth at bedtime., Disp: , Rfl:    prochlorperazine  (COMPAZINE ) 10 MG tablet, Take 1 tablet (10 mg total) by mouth every 6 (six) hours as needed for nausea or vomiting., Disp: 30 tablet, Rfl: 1   pyridoxine (B-6) 100 MG tablet, Take 100 mg by mouth daily., Disp: , Rfl:  No current  facility-administered medications for this  visit.  Facility-Administered Medications Ordered in Other Visits:    potassium chloride  10 mEq in 100 mL IVPB, 10 mEq, Intravenous, Q1 Hr x 3, Walisiewicz, Shaina Gullatt E, PA-C, Last Rate: 100 mL/hr at 11/01/23 1508, 10 mEq at 11/01/23 1508  PHYSICAL EXAM ECOG FS:1 - Symptomatic but completely ambulatory    Vitals:   11/01/23 1145 11/01/23 1222  BP: (!) 92/55 126/61  Pulse: 68   Resp: 18   Temp: 97.6 F (36.4 C)   TempSrc: Temporal   SpO2: 100%    Physical Exam Vitals and nursing note reviewed.  Constitutional:      Appearance: He is ill-appearing. He is not toxic-appearing.  HENT:     Head: Normocephalic.     Mouth/Throat:     Mouth: Mucous membranes are dry.  Eyes:     Conjunctiva/sclera: Conjunctivae normal.  Cardiovascular:     Rate and Rhythm: Normal rate. Rhythm irregular.     Pulses: Normal pulses.     Heart sounds: Normal heart sounds.  Pulmonary:     Effort: Pulmonary effort is normal.     Breath sounds: Normal breath sounds.  Abdominal:     General: Bowel sounds are normal. There is no distension.     Palpations: Abdomen is soft.     Tenderness: There is no abdominal tenderness.  Musculoskeletal:     Cervical back: Normal range of motion.  Skin:    General: Skin is warm and dry.  Neurological:     Mental Status: He is alert.        LABORATORY DATA I have reviewed the data as listed    Latest Ref Rng & Units 11/01/2023   11:25 AM 10/26/2023    2:47 PM 10/20/2023    4:10 AM  CBC  WBC 4.0 - 10.5 K/uL 12.5  7.5  14.5   Hemoglobin 13.0 - 17.0 g/dL 87.7  89.8  9.3   Hematocrit 39.0 - 52.0 % 35.2  30.1  28.8   Platelets 150 - 400 K/uL 316  390  222         Latest Ref Rng & Units 11/01/2023   11:25 AM 10/26/2023    2:47 PM 10/19/2023    1:58 AM  CMP  Glucose 70 - 99 mg/dL 897  862  886   BUN 8 - 23 mg/dL 11  8  10    Creatinine 0.61 - 1.24 mg/dL 9.14  9.26  9.37   Sodium 135 - 145 mmol/L 135  134  136   Potassium 3.5 - 5.1 mmol/L 3.0  4.5  3.1    Chloride 98 - 111 mmol/L 102  105  106   CO2 22 - 32 mmol/L 22  22  22    Calcium  8.9 - 10.3 mg/dL 6.6  7.5  7.0   Total Protein 6.5 - 8.1 g/dL 5.4  5.4  5.3   Total Bilirubin 0.0 - 1.2 mg/dL 0.5  0.3  0.8   Alkaline Phos 38 - 126 U/L 57  55  51   AST 15 - 41 U/L 27  26  22    ALT 0 - 44 U/L 31  24  26         RADIOGRAPHIC STUDIES (from last 24 hours if applicable) I have personally reviewed the radiological images as listed and agreed with the findings in the report. No results found.      Visit Diagnosis: 1. Nausea and vomiting, unspecified vomiting type   2. Multiple  myeloma not having achieved remission (HCC)   3. Diarrhea, unspecified type   4. Hypomagnesemia   5. Hypokalemia      Orders Placed This Encounter  Procedures   C difficile quick screen w PCR reflex    Standing Status:   Future    Expected Date:   11/02/2023    Expiration Date:   10/31/2024    All questions were answered. The patient knows to call the clinic with any problems, questions or concerns. No barriers to learning was detected.  A total of more than 30 minutes were spent on this encounter with face-to-face time and non-face-to-face time, including preparing to see the patient, ordering tests and/or medications, counseling the patient and coordination of care as outlined above.    Thank you for allowing me to participate in the care of this patient.    Benney Sommerville E  Walisiewicz, PA-C Department of Hematology/Oncology [facility] at [facility] Phone: [phone]  [phone]    11/01/2023 4:01 PM

## 2023-11-01 NOTE — Patient Instructions (Signed)
 Nausea and Vomiting, Adult Nausea is feeling that you have an upset stomach and that you are about to vomit. Vomiting is when food in your stomach forcefully comes out of your mouth. Vomiting can make you feel weak. If you vomit, or if you are not able to drink enough fluids, you may not have enough water in your body (get dehydrated). If you do not have enough water in your body, you may: Feel tired. Feel thirsty. Have a dry mouth. Have cracked lips. Pee (urinate) less often. Older adults and people with other diseases or a weak body defense system (immune system) are at higher risk for not having enough water in the body. If you feel like you may vomit or you vomit, it is important to follow instructions from your doctor about how to take care of yourself. Follow these instructions at home: Watch your symptoms for any changes. Tell your doctor about them. Eating and drinking     Take an ORS (oral rehydration solution). This is a drink that is sold at pharmacies and stores. Drink clear fluids in small amounts as you are able, such as: Water. Ice chips. Fruit juice that has water added (diluted fruit juice). Low-calorie sports drinks. Eat bland, easy-to-digest foods in small amounts as you are able, such as: Bananas. Applesauce. Rice. Low-fat (lean) meats. Toast. Crackers. Avoid drinking fluids that have a lot of sugar or caffeine in them. This includes energy drinks, sports drinks, and soda. Avoid alcohol. Avoid spicy or fatty foods. General instructions Take over-the-counter and prescription medicines only as told by your doctor. Drink enough fluid to keep your pee (urine) pale yellow. Wash your hands often with soap and water for at least 20 seconds. If you cannot use soap and water, use hand sanitizer. Make sure that everyone in your home washes their hands well and often. Rest at home until you feel better. Watch your condition for any changes. Take slow and deep breaths  when you feel like you may vomit. Keep all follow-up visits. Contact a doctor if: Your symptoms get worse. You have new symptoms. You have a fever. You cannot drink fluids without vomiting. You feel like you may vomit for more than 2 days. You feel light-headed or dizzy. You have a headache. You have muscle cramps. You have a rash. You have pain while peeing. Get help right away if: You have pain in your chest, neck, arm, or jaw. You feel very weak or you faint. You vomit again and again. You have vomit that is bright red or looks like black coffee grounds. You have bloody or black poop (stools) or poop that looks like tar. You have a very bad headache, a stiff neck, or both. You have very bad pain, cramping, or bloating in your belly (abdomen). You have trouble breathing. You are breathing very quickly. Your heart is beating very quickly. Your skin feels cold and clammy. You feel confused. You have signs of losing too much water in your body, such as: Dark pee, very little pee, or no pee. Cracked lips. Dry mouth. Sunken eyes. Sleepiness. Weakness. These symptoms may be an emergency. Get help right away. Call 911. Do not wait to see if the symptoms will go away. Do not drive yourself to the hospital. Summary Nausea is feeling that you have an upset stomach and that you are about to vomit. Vomiting is when food in your stomach comes out of your mouth. Follow instructions from your doctor about eating and drinking.  Take over-the-counter and prescription medicines only as told by your doctor. Contact your doctor if your symptoms get worse or you have new symptoms. Keep all follow-up visits. This information is not intended to replace advice given to you by your health care provider. Make sure you discuss any questions you have with your health care provider. Document Revised: 09/25/2020 Document Reviewed: 09/25/2020 Elsevier Patient Education  2024 [employer].

## 2023-11-01 NOTE — Telephone Encounter (Signed)
 Received call from pt's wife, Vertell. She states that Aulton is still having diarrhea. She does not think it is related to his treatment at this point.  She states he is having watery, light colored type diarrhea. Also c/o nausea and vomiting. Denies fever. She states that he is very weak, cannot eat much. She doesn't think she can get to his treatment tomorrow. Advised that he needs to be seen. Advised that we can see him in [facility] or he can go to the ED. She is agreeable to bringing him here to be seen in [facility]  Notified Mallie, W PA in [facility] and pt can be seen at 11:30 am Made wife aware that we can see him today@ 11:30 am. She sates she can get him here.  Dr. Federico made aware.

## 2023-11-01 NOTE — Telephone Encounter (Signed)
 See previous note

## 2023-11-02 ENCOUNTER — Encounter: Payer: Self-pay | Admitting: General Practice

## 2023-11-02 ENCOUNTER — Inpatient Hospital Stay (HOSPITAL_COMMUNITY)

## 2023-11-02 ENCOUNTER — Other Ambulatory Visit: Payer: Self-pay

## 2023-11-02 ENCOUNTER — Encounter: Payer: Self-pay | Admitting: Nutrition

## 2023-11-02 ENCOUNTER — Inpatient Hospital Stay

## 2023-11-02 ENCOUNTER — Encounter (HOSPITAL_COMMUNITY): Payer: Self-pay

## 2023-11-02 ENCOUNTER — Encounter (HOSPITAL_COMMUNITY): Payer: Self-pay | Admitting: Family Medicine

## 2023-11-02 ENCOUNTER — Inpatient Hospital Stay: Admitting: Nutrition

## 2023-11-02 ENCOUNTER — Inpatient Hospital Stay: Admitting: Hematology and Oncology

## 2023-11-02 ENCOUNTER — Inpatient Hospital Stay (HOSPITAL_COMMUNITY)
Admission: EM | Admit: 2023-11-02 | Discharge: 2023-11-05 | DRG: 345 | Disposition: A | Discharge status: Home / Self Care | Source: Ambulatory Visit | Attending: Internal Medicine | Admitting: Internal Medicine

## 2023-11-02 VITALS — BP 120/61 | HR 67 | Temp 98.3°F | Resp 16

## 2023-11-02 DIAGNOSIS — E860 Dehydration: Principal | ICD-10-CM | POA: Diagnosis present

## 2023-11-02 DIAGNOSIS — R112 Nausea with vomiting, unspecified: Secondary | ICD-10-CM | POA: Diagnosis present

## 2023-11-02 DIAGNOSIS — Z7952 Long term (current) use of systemic steroids: Secondary | ICD-10-CM | POA: Diagnosis not present

## 2023-11-02 DIAGNOSIS — K760 Fatty (change of) liver, not elsewhere classified: Secondary | ICD-10-CM | POA: Diagnosis present

## 2023-11-02 DIAGNOSIS — K529 Noninfective gastroenteritis and colitis, unspecified: Secondary | ICD-10-CM | POA: Diagnosis not present

## 2023-11-02 DIAGNOSIS — C9000 Multiple myeloma not having achieved remission: Secondary | ICD-10-CM | POA: Diagnosis present

## 2023-11-02 DIAGNOSIS — R197 Diarrhea, unspecified: Secondary | ICD-10-CM

## 2023-11-02 DIAGNOSIS — K56699 Other intestinal obstruction unspecified as to partial versus complete obstruction: Principal | ICD-10-CM | POA: Diagnosis present

## 2023-11-02 DIAGNOSIS — E440 Moderate protein-calorie malnutrition: Secondary | ICD-10-CM | POA: Diagnosis present

## 2023-11-02 DIAGNOSIS — Z6824 Body mass index (BMI) 24.0-24.9, adult: Secondary | ICD-10-CM

## 2023-11-02 DIAGNOSIS — R188 Other ascites: Secondary | ICD-10-CM | POA: Diagnosis present

## 2023-11-02 DIAGNOSIS — F32A Depression, unspecified: Secondary | ICD-10-CM | POA: Diagnosis present

## 2023-11-02 DIAGNOSIS — G4733 Obstructive sleep apnea (adult) (pediatric): Secondary | ICD-10-CM | POA: Diagnosis present

## 2023-11-02 DIAGNOSIS — I10 Essential (primary) hypertension: Secondary | ICD-10-CM | POA: Diagnosis present

## 2023-11-02 DIAGNOSIS — K219 Gastro-esophageal reflux disease without esophagitis: Secondary | ICD-10-CM | POA: Diagnosis present

## 2023-11-02 DIAGNOSIS — Z91013 Allergy to seafood: Secondary | ICD-10-CM

## 2023-11-02 DIAGNOSIS — K56609 Unspecified intestinal obstruction, unspecified as to partial versus complete obstruction: Secondary | ICD-10-CM | POA: Diagnosis not present

## 2023-11-02 DIAGNOSIS — F419 Anxiety disorder, unspecified: Secondary | ICD-10-CM | POA: Diagnosis present

## 2023-11-02 DIAGNOSIS — Z8249 Family history of ischemic heart disease and other diseases of the circulatory system: Secondary | ICD-10-CM

## 2023-11-02 DIAGNOSIS — G5601 Carpal tunnel syndrome, right upper limb: Secondary | ICD-10-CM | POA: Diagnosis present

## 2023-11-02 DIAGNOSIS — Z7901 Long term (current) use of anticoagulants: Secondary | ICD-10-CM | POA: Diagnosis not present

## 2023-11-02 DIAGNOSIS — Z8719 Personal history of other diseases of the digestive system: Secondary | ICD-10-CM

## 2023-11-02 DIAGNOSIS — Z83438 Family history of other disorder of lipoprotein metabolism and other lipidemia: Secondary | ICD-10-CM | POA: Diagnosis not present

## 2023-11-02 DIAGNOSIS — Z79899 Other long term (current) drug therapy: Secondary | ICD-10-CM

## 2023-11-02 DIAGNOSIS — E785 Hyperlipidemia, unspecified: Secondary | ICD-10-CM | POA: Diagnosis present

## 2023-11-02 DIAGNOSIS — I480 Paroxysmal atrial fibrillation: Secondary | ICD-10-CM | POA: Diagnosis present

## 2023-11-02 DIAGNOSIS — G629 Polyneuropathy, unspecified: Secondary | ICD-10-CM | POA: Diagnosis present

## 2023-11-02 DIAGNOSIS — Z8701 Personal history of pneumonia (recurrent): Secondary | ICD-10-CM | POA: Diagnosis not present

## 2023-11-02 DIAGNOSIS — I4891 Unspecified atrial fibrillation: Secondary | ICD-10-CM | POA: Diagnosis not present

## 2023-11-02 LAB — PHOSPHORUS: Phosphorus: 2.3 mg/dL — ABNORMAL LOW (ref 2.5–4.6)

## 2023-11-02 LAB — CBC WITH DIFFERENTIAL (CANCER CENTER ONLY)
Abs Immature Granulocytes: 0.04 K/uL (ref 0.00–0.07)
Basophils Absolute: 0 K/uL (ref 0.0–0.1)
Basophils Relative: 0 %
Eosinophils Absolute: 0 K/uL (ref 0.0–0.5)
Eosinophils Relative: 0 %
HCT: 36.2 % — ABNORMAL LOW (ref 39.0–52.0)
Hemoglobin: 12.4 g/dL — ABNORMAL LOW (ref 13.0–17.0)
Immature Granulocytes: 0 %
Lymphocytes Relative: 6 %
Lymphs Abs: 0.7 K/uL (ref 0.7–4.0)
MCH: 31.2 pg (ref 26.0–34.0)
MCHC: 34.3 g/dL (ref 30.0–36.0)
MCV: 91 fL (ref 80.0–100.0)
Monocytes Absolute: 0.4 K/uL (ref 0.1–1.0)
Monocytes Relative: 4 %
Neutro Abs: 9.6 K/uL — ABNORMAL HIGH (ref 1.7–7.7)
Neutrophils Relative %: 90 %
Platelet Count: 329 K/uL (ref 150–400)
RBC: 3.98 MIL/uL — ABNORMAL LOW (ref 4.22–5.81)
RDW: 16.5 % — ABNORMAL HIGH (ref 11.5–15.5)
WBC Count: 10.8 K/uL — ABNORMAL HIGH (ref 4.0–10.5)
nRBC: 0 % (ref 0.0–0.2)

## 2023-11-02 LAB — BASIC METABOLIC PANEL WITH GFR
Anion gap: 13 (ref 5–15)
BUN: 13 mg/dL (ref 8–23)
CO2: 19 mmol/L — ABNORMAL LOW (ref 22–32)
Calcium: 6.7 mg/dL — ABNORMAL LOW (ref 8.9–10.3)
Chloride: 103 mmol/L (ref 98–111)
Creatinine, Ser: 0.91 mg/dL (ref 0.61–1.24)
GFR, Estimated: >60 mL/min (ref >60)
Glucose, Bld: 101 mg/dL — ABNORMAL HIGH (ref 70–99)
Potassium: 3.7 mmol/L (ref 3.5–5.1)
Sodium: 135 mmol/L (ref 135–145)

## 2023-11-02 LAB — CMP (CANCER CENTER ONLY)
ALT: 30 U/L (ref 0–44)
AST: 27 U/L (ref 15–41)
Albumin: 3 g/dL — ABNORMAL LOW (ref 3.5–5.0)
Alkaline Phosphatase: 62 U/L (ref 38–126)
Anion gap: 13 (ref 5–15)
BUN: 11 mg/dL (ref 8–23)
CO2: 21 mmol/L — ABNORMAL LOW (ref 22–32)
Calcium: 6.7 mg/dL — ABNORMAL LOW (ref 8.9–10.3)
Chloride: 101 mmol/L (ref 98–111)
Creatinine: 0.74 mg/dL (ref 0.61–1.24)
GFR, Estimated: >60 mL/min (ref >60)
Glucose, Bld: 111 mg/dL — ABNORMAL HIGH (ref 70–99)
Potassium: 3.5 mmol/L (ref 3.5–5.1)
Sodium: 135 mmol/L (ref 135–145)
Total Bilirubin: 0.5 mg/dL (ref 0.0–1.2)
Total Protein: 5.8 g/dL — ABNORMAL LOW (ref 6.5–8.1)

## 2023-11-02 LAB — MAGNESIUM
Magnesium: 1.7 mg/dL (ref 1.7–2.4)
Magnesium: 1.8 mg/dL (ref 1.7–2.4)

## 2023-11-02 LAB — CK: Total CK: 188 U/L (ref 49–397)

## 2023-11-02 MED ORDER — ALBUTEROL SULFATE (2.5 MG/3ML) 0.083% IN NEBU: 2.5000 mg | INHALATION_SOLUTION | RESPIRATORY_TRACT | Status: DC | PRN

## 2023-11-02 MED ORDER — ACETAMINOPHEN 650 MG RE SUPP
650.0000 mg | Freq: Four times a day (QID) | RECTAL | Status: DC | PRN
Start: 2023-11-02 — End: 2023-11-05

## 2023-11-02 MED ORDER — ACETAMINOPHEN 325 MG PO TABS
650.0000 mg | ORAL_TABLET | Freq: Four times a day (QID) | ORAL | Status: AC | PRN
Start: 2023-11-02 — End: ?
  Administered 2023-11-03 – 2023-11-04 (×2): 650 mg via ORAL
  Filled 2023-11-02 (×2): qty 2

## 2023-11-02 MED ORDER — SODIUM CHLORIDE 0.9 % IV SOLN: INTRAVENOUS | Status: AC

## 2023-11-02 MED ORDER — FENTANYL CITRATE PF 50 MCG/ML IJ SOSY
12.5000 ug | PREFILLED_SYRINGE | INTRAMUSCULAR | Status: DC | PRN
  Administered 2023-11-02: 50 ug via INTRAVENOUS
  Filled 2023-11-02: qty 1

## 2023-11-02 MED ORDER — ONDANSETRON HCL 4 MG/2ML IJ SOLN
4.0000 mg | Freq: Four times a day (QID) | INTRAMUSCULAR | Status: DC | PRN
  Administered 2023-11-02 – 2023-11-03 (×2): 4 mg via INTRAVENOUS
  Filled 2023-11-02: qty 2

## 2023-11-02 MED ORDER — ONDANSETRON HCL 4 MG PO TABS: 4.0000 mg | ORAL_TABLET | Freq: Four times a day (QID) | ORAL | Status: DC | PRN

## 2023-11-02 NOTE — H&P (Signed)
 David Mcgrath FMW:[id number] DOB: 02-12-1951 DOA: 11/02/2023     PCP: [name], DO   Outpatient Specialists:   CARDS:   Dr. Gordy Bergamo, MD    Oncology   Dr. Federico Darlyn Margarete Darlyn Dr. Dianna  Patient arrived to ER on  at  Referred by Attending Drusilla Sabas RAMAN, MD   Patient coming from:    home Lives  With family    Chief Complaint:  N/V/D   HPI: David Mcgrath is a 73 y.o. male with medical history significant of   multiple myeloma, HTN, HLD, neuropathy, paroxysmal atrial fibrillation, gout, SBO     Presented with hydration nausea vomiting diarrhea sent in from oncology clinic Patient was seen at oncology clinic today for follow-up for IgG Kappa MM  Patient has been having nausea vomiting and diarrhea had to be seen recently for electrolyte repletion Has been having profuse watery diarrhea no blood no black stools Has been vomiting 4 to 5-day ems a day Was seen in clinic received IV fluid bolus diarrhea has stopped but he still has poor p.o. intake bilious vomiting patient wanted to be admitted to the hospital oncology arrange for direct admission His medical history is consistent with history of aspiration pneumonia in March 2025 when he was found to have lytic lesions later on he was found to have elevated free kappa light chains and establish care with oncology has been getting Velcade /Rev/Dex cycle 2 was on 19th of June  Was admitted 2 wks ago for SBO   Hemoccult was positive positive  CT angio abdomen pelvis showed No c evidence of GI bleeding 2. Progressive small bowel distension with suspected transition point in the right hemiabdomen, suspicious for small bowel obstruction. Placed  NG tube  And he improved with conservative management  He has not been able to tolerate his meds has not had eliquis  this AM  Not able to tolerate meds   Diarrhea stopped 2 days ago  Not passing gas today     Denies significant ETOH intake  Does not smoke     Regarding pertinent Chronic problems:    Hyperlipidemia -  on statins Pravachol (pravastatin)  Lipid Panel  No results found for: CHOL, TRIG, HDL, CHOLHDL, VLDL, LDLCALC, LDLDIRECT, LABVLDL   HTN on Lopressor   last echo  Recent Results (from the past 56199 hours)  ECHOCARDIOGRAM COMPLETE   Collection Time: 07/27/23 12:25 PM  Result Value   S' Lateral 2.00   Area-P 1/2 4.86   Ao pk vel 1.52   AR max vel 2.38   AV Peak grad 9.2   AV Area VTI 2.29   AV Area mean vel 2.10   AV Mean grad 5.5   MV M vel 3.59   MV Peak grad 51.6   Single Plane A2C EF 68.6   Single Plane A4C EF 67.8   Calc EF 68.1   Est EF 60 - 65%   Narrative      ECHOCARDIOGRAM REPORT     IMPRESSIONS    1. Left ventricular ejection fraction, by estimation, is 60 to 65%. The left ventricle has normal function. The left ventricle has no regional wall motion abnormalities. Left ventricular diastolic parameters were normal.  2. Right ventricular systolic function is normal. The right ventricular size is normal.  3. The mitral valve is normal in structure. Mild mitral valve regurgitation. No evidence of mitral stenosis.  4. The aortic valve is calcified. There is moderate calcification  of the aortic valve. There is moderate thickening of the aortic valve. Aortic valve regurgitation is not visualized. No aortic stenosis is present.  5. The inferior vena cava is normal in size with greater than 50% respiratory variability, suggesting right atrial pressure of 3 mmHg.          OSA -on nocturna CPAP     A. Fib -   atrial fibrillation CHA2DS2 vas score  2       current  on anticoagulation with  Eliquis ,         Chronic anemia - baseline hg Hemoglobin & Hematocrit  Recent Labs    10/26/23 1447 11/01/23 1125 11/02/23 1443  HGB 10.1* 12.2* 12.4*     Cancer: Multiple myeloma followed by Dr. Federico Pizza cell count 10.8 hemoglobin 12.5 creatinine 0.74 sodium 135 potassium 3.5 bicarb 21 normal  LFTs    Lab Orders         Magnesium          Phosphorus         Comprehensive metabolic panel         CBC         Basic metabolic panel with GFR         Magnesium          Phosphorus         CK     KUB - ordered    CTabd/pelvis - ordered      ED Triage Vitals [11/02/23 1747]  Encounter Vitals Group     BP (!) 140/86     Girls Systolic BP Percentile      Girls Diastolic BP Percentile      Boys Systolic BP Percentile      Boys Diastolic BP Percentile      Pulse Rate 94     Resp 19     Temp 98.3 F (36.8 C)     Temp Source Axillary     SpO2      Weight 202 lb 2.6 oz (91.7 kg)     Height 6' 4 (1.93 m)     Head Circumference      Peak Flow      Pain Score 5     Pain Loc      Pain Education      Exclude from Growth Chart   UFJK(75)@     _________________________________________ Significant initial  Findings: Abnormal Labs Reviewed  BASIC METABOLIC PANEL WITH GFR - Abnormal; Notable for the following components:      Result Value   CO2 19 (*)    Glucose, Bld 101 (*)    Calcium  6.7 (*)    All other components within normal limits  PHOSPHORUS - Abnormal; Notable for the following components:   Phosphorus 2.3 (*)    All other components within normal limits     Cardiac Panel (last 3 results) Recent Labs    11/02/23 2138  CKTOTAL 188     ECG: Ordered      The recent clinical data is shown below. Vitals:   11/02/23 1747  BP: (!) 140/86  Pulse: 94  Resp: 19  Temp: 98.3 F (36.8 C)  TempSrc: Axillary  Weight: 91.7 kg  Height: 6' 4 (1.93 m)    WBC     Component Value Date/Time   WBC 10.8 (H) 11/02/2023 1443   WBC 14.5 (H) 10/20/2023 0410   LYMPHSABS 0.7 11/02/2023 1443   MONOABS 0.4 11/02/2023 1443   EOSABS 0.0 11/02/2023  1443   BASOSABS 0.0 11/02/2023 1443      UA  ordered     Results for orders placed or performed in visit on 09/21/23  Urine Culture     Status: Abnormal   Collection Time: 09/21/23  1:39 PM   Specimen: Urine, Clean  Catch  Result Value Ref Range Status   Specimen Description   Final    URINE, CLEAN CATCH Performed at [facility], [address] [place], [place] [address]    Special Requests   Final    NONE Performed at [facility], [address], [place], [place] [address]    Culture (A)  Final    <10,000 COLONIES/mL INSIGNIFICANT GROWTH Performed at [facility], [address] [place], [place] [address]    Report Status 09/22/2023 FINAL  Final   __________________________________________________ Recent Labs  Lab 11/01/23 1125 11/02/23 1443 11/02/23 2138  NA 135 135 135  K 3.0* 3.5 3.7  CO2 22 21* 19*  GLUCOSE 102* 111* 101*  BUN 11 11 13   CREATININE 0.85 0.74 0.91  CALCIUM  6.6* 6.7* 6.7*  MG 1.0* 1.7 1.8  PHOS  --   --  2.3*    Cr  stable,    Lab Results  Component Value Date   CREATININE 0.91 11/02/2023   CREATININE 0.74 11/02/2023   CREATININE 0.85 11/01/2023    Recent Labs  Lab 11/01/23 1125 11/02/23 1443  AST 27 27  ALT 31 30  ALKPHOS 57 62  BILITOT 0.5 0.5  PROT 5.4* 5.8*  ALBUMIN 2.8* 3.0*   Lab Results  Component Value Date   CALCIUM  6.7 (L) 11/02/2023   PHOS 2.3 (L) 11/02/2023        Plt: Lab Results  Component Value Date   PLT 329 11/02/2023       Recent Labs  Lab 11/01/23 1125 11/02/23 1443  WBC 12.5* 10.8*  NEUTROABS 10.6* 9.6*  HGB 12.2* 12.4*  HCT 35.2* 36.2*  MCV 90.7 91.0  PLT 316 329    HG/HCT  stable,      Component Value Date/Time   HGB 12.4 (L) 11/02/2023 1443   HGB 10.9 (L) 08/02/2023 0942   HCT 36.2 (L) 11/02/2023 1443   HCT 32.0 (L) 08/02/2023 0942   MCV 91.0 11/02/2023 1443   MCV 96 08/02/2023 0942     _______________________________________________ Hospitalist was called for admission for  N/V/D   The following Work up has been ordered so far:  Orders Placed This Encounter  Procedures   Insert peripheral IV     OTHER Significant initial   Findings:  labs showing:     DM  labs:  HbA1C: No results for input(s): HGBA1C in the last 8760 hours.     CBG (last 3)  No results for input(s): GLUCAP in the last 72 hours.        Cultures:    Component Value Date/Time   SDES  09/21/2023 1339    URINE, CLEAN CATCH Performed at [facility], [address] [place], [place] [address]    SPECREQUEST  09/21/2023 1339    NONE Performed at [facility], [address], [place], [place] [address]    CULT (A) 09/21/2023 1339    <10,000 COLONIES/mL INSIGNIFICANT GROWTH Performed at [facility], [address], [place], [place] [address]    REPTSTATUS 09/22/2023 FINAL 09/21/2023 1339     Radiological Exams on Admission:  DG Abd 1 View Result Date: 11/02/2023 CLINICAL DATA:  Small bowel obstruction. Nausea, vomiting, and lower quadrant abdominal pain. History of cancer. EXAM: ABDOMEN - 1 VIEW COMPARISON:  10/18/2023 FINDINGS: Moderately distended gas-filled upper abdominal small bowel with scattered gas and stool in the colon. Gaseous distention of lower abdominal small bowel is improved since prior study. No radiopaque stones. Degenerative changes in the spine and hips. Visualized lung bases are clear. IMPRESSION: Persisting gaseous distention of small bowel but demonstrating some improvement since prior study. Electronically Signed   By: Elsie Gravely M.D.   On: 11/02/2023 19:33   _______________________________________________________________________________________________________ Latest  Blood pressure (!) 140/86, pulse 94, temperature 98.3 F (36.8 C), temperature source Axillary, resp. rate 19, height 6' 4 (1.93 m), weight 91.7 kg.   Vitals  labs and radiology finding personally reviewed  Review of Systems:    Pertinent positives include:  abdominal pain, nausea, vomiting, diarrhea,  Constitutional:  No weight loss, night sweats, Fevers, chills, fatigue,  weight loss  HEENT:  No headaches, Difficulty swallowing,Tooth/dental problems,Sore throat,  No sneezing, itching, ear ache, nasal congestion, post nasal drip,  Cardio-vascular:  No chest pain, Orthopnea, PND, anasarca, dizziness, palpitations.no Bilateral lower extremity swelling  GI:  No heartburn, indigestion, change in bowel habits, loss of appetite, melena, blood in stool, hematemesis Resp:  no shortness of breath at rest. No dyspnea on exertion, No excess mucus, no productive cough, No non-productive cough, No coughing up of blood.No change in color of mucus.No wheezing. Skin:  no rash or lesions. No jaundice GU:  no dysuria, change in color of urine, no urgency or frequency. No straining to urinate.  No flank pain.  Musculoskeletal:  No joint pain or no joint swelling. No decreased range of motion. No back pain.  Psych:  No change in mood or affect. No depression or anxiety. No memory loss.  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  Patient reports he does not have symptoms as of 02/25/14.     Diarrhea 06/04/2023   Essential hypertension 06/04/2023   Fatty liver 06/04/2023   Flu 06/04/2023   Hyperlipidemia 06/04/2023   Ileus (HCC) 06/04/2023   Neuropathy    Paroxysmal atrial fibrillation (HCC) 07/04/2023   Pneumonia 06/04/2023     Past Surgical History:  Procedure Laterality Date   IR BONE MARROW BIOPSY & ASPIRATION  07/31/2023   NO PAST  SURGERIES      Social History:  Ambulatory   cane,  or walker     reports that he has never smoked. He has never used smokeless tobacco. He reports current alcohol  use. He reports that he does not use drugs.   Family History:  Family History  Problem Relation Age of Onset   Diabetes Mother    Cancer Mother    Heart disease Mother    Cancer Father    Hyperlipidemia Father    Neuropathy Paternal Uncle    Migraines Neg Hx    ______________________________________________________________________________________________ Allergies: Allergies  Allergen Reactions   Shrimp [Shellfish Allergy] Anaphylaxis and Swelling    Swelling  throat     Prior to Admission medications   Medication Sig Start Date End Date Taking? Authorizing Provider  acyclovir  (ZOVIRAX ) 400 MG tablet Take 1 tablet (400 mg total) by mouth 2 (two) times daily. 08/08/23   Federico Norleen ONEIDA MADISON, MD  allopurinol  (ZYLOPRIM ) 300 MG tablet Take 300 mg by mouth daily.    [provider]  apixaban  (ELIQUIS ) 5 MG TABS tablet Take 1 tablet (5 mg total) by mouth 2 (two) times daily. 07/04/23   Revankar, Jennifer SAUNDERS, MD  calcium -vitamin D (OSCAL WITH D) 500-5 MG-MCG tablet Take 1 tablet by mouth 2 (two) times daily. 09/21/23   Federico Norleen ONEIDA MADISON, MD  Cholecalciferol (VITAMIN D-3) 125 MCG (5000 UT) TABS Take 5,000 Units by mouth daily.    [provider]  Cyanocobalamin  (VITAMIN B12) 1000 MCG TBCR Take 1,000 mcg by mouth daily.    [provider]  dexamethasone  (DECADRON ) 4 MG tablet Take 40 mg by mouth once a week on day of Velcade  injection. (10 tablets) 09/07/23   Dorsey, Djibril T IV, MD  diphenoxylate -atropine  (LOMOTIL ) 2.5-0.025 MG tablet Take 1 tablet by mouth 4 (four) times daily as needed for diarrhea or loose stools. 08/23/23   Thayil, Irene T, PA-C  ezetimibe (ZETIA) 10 MG tablet Take 10 mg by mouth every evening. 06/01/19   [provider]  FLUoxetine  (PROZAC ) 20 MG capsule Take 20 mg by mouth daily.     [provider]  fluticasone (FLONASE) 50 MCG/ACT nasal spray Place 1 spray into both nostrils daily.    [provider]  gabapentin  (NEURONTIN ) 300 MG capsule Take 2 capsules (600 mg total) by mouth 2 (two) times daily. Patient taking differently: Take 600 mg by mouth 3 (three) times daily. 06/15/23   Briana Elgin LABOR, MD  guaiFENesin  (MUCINEX ) 600 MG 12 hr tablet Take 600 mg by mouth daily.    [provider]  guaifenesin  (ROBITUSSIN) 100 MG/5ML syrup Take 200 mg by mouth daily as needed for cough.    [provider]  lenalidomide  (REVLIMID ) 10 MG capsule Take 1 capsule (10 mg total) by mouth daily. Bertrum Barrows #   [id number]   Date Obtained 11/01/23 Take one daily for 21 days then none for 7 days. 11/01/23   Dorsey, Keo T IV, MD  melatonin 5 MG TABS Take 5 mg by mouth at bedtime.    [provider]  metoprolol  tartrate (LOPRESSOR ) 25 MG tablet Take 0.5 tablets (12.5 mg total) by mouth daily as needed (For persistent palpitation). 10/20/23   Arlice Reichert, MD  omeprazole (PRILOSEC) 40 MG capsule Take 40 mg by mouth daily.    [provider]  ondansetron  (ZOFRAN ) 8 MG tablet Take 1 tablet (8 mg total) by mouth every 8 (eight) hours as needed for nausea or vomiting. 08/08/23   Federico Norleen ONEIDA MADISON, MD  potassium chloride  SA (KLOR-CON  M) 20 MEQ tablet Take 1 tablet (20 mEq total) by mouth 2 (two) times daily. 09/28/23   Dorsey, Jaye T IV, MD  pravastatin (PRAVACHOL) 80 MG tablet Take 80 mg by mouth at bedtime.    [provider]  prochlorperazine  (COMPAZINE ) 10 MG tablet Take 1 tablet (10 mg total) by mouth every 6 (six) hours as needed for nausea or vomiting. 08/08/23   Federico Norleen ONEIDA MADISON, MD  pyridoxine (B-6) 100 MG tablet Take 100 mg by mouth daily.    [provider]    ___________________________________________________________________________________________________ Physical Exam:    11/02/2023  5:47 PM 11/02/2023    3:08 PM  11/01/2023    4:17 PM  Vitals with BMI  Height 6' 4    Weight 202 lbs 3 oz --   BMI 24.62    Systolic 140 120 871  Diastolic 86 61 73  Pulse 94 67 63     1. General:  in No  Acute distress    Chronically ill   -appearing 2. Psychological: Alert and   Oriented 3. Head/ENT:   Dry Mucous Membranes                          Head Non traumatic, neck supple                           Poor Dentition 4. SKIN:  decreased Skin turgor,  Skin clean Dry and intact no rash    5. Heart: Regular rate and rhythm no  Murmur, no Rub or gallop 6. Lungs:   no wheezes or crackles   7. Abdomen: Soft,  non-tender, Non distended   bowel sounds diminished 8. Lower extremities: no clubbing, cyanosis, no  edema 9. Neurologically Grossly intact, moving all 4 extremities equally   10. MSK: Normal range of motion    Chart has been reviewed  ______________________________________________________________________________________________  Assessment/Plan  73 y.o. male with medical history significant of   multiple myeloma, HTN, HLD, neuropathy, paroxysmal atrial fibrillation, gout, SBO    Admitted for N/v/D   Present on Admission:  Dehydration  Paroxysmal atrial fibrillation (HCC)  Multiple myeloma (HCC)  Essential hypertension  Hyperlipidemia  OSA (obstructive sleep apnea)  Nausea & vomiting  SBO (small bowel obstruction) (HCC)     Paroxysmal atrial fibrillation (HCC) Hold Eliquis  for now until small bowel obstruction evaluated. Patient may need further GI evaluation.  Multiple myeloma (HCC) Appreciate Dr. Federico involvement oncology will follow up in the morning  Neuropathy Hold Neurontin  for tonight until had further evaluation the patient is able to tolerate p.o.  Essential hypertension Allow permissive hypertension for today  Hyperlipidemia Resume statins when able to tolerate  Dehydration Will rehydrate and recheck fluid status  OSA (obstructive sleep apnea) Hold off on CPAP  while possible SBO  Nausea & vomiting Obtain imaging to rule out small bowel obstruction  SBO (small bowel obstruction) (HCC) Will need CT to further evel, recurrent issue, no vomiting now  Pt refused NG  GI to see in AM  If confirmed on CT may benefit from surgery consult too   Other plan as per orders.  DVT prophylaxis:  SCD        Code Status:    Code Status: Prior FULL CODE  as per patient  I had personally discussed CODE STATUS with patient  ACP  none  Family Communication:   Family not at  Bedside  plan of care was discussed on the phone with   Wife,   Diet  npo  Disposition Plan:        To home once workup is complete and patient is stable  Following barriers for discharge:                                                        Pain controlled with PO medications  Will need to be able to tolerate PO                                                        Will need consultants to evaluate patient prior to discharge                                                Would benefit from PT/OT eval prior to DC  Ordered                                     Consults called: oncology is aware , [facility] is aware        inpatient     I Expect 2 midnight stay secondary to severity of patient's current illness need for inpatient interventions justified by the following:     Severe lab/radiological/exam abnormalities including:     and extensive comorbidities including:   malignancy,   That are currently affecting medical management.   I expect  patient to be hospitalized for 2 midnights requiring inpatient medical care.  Patient is at high risk for adverse outcome (such as loss of life or disability) if not treated.  Indication for inpatient stay as follows:   inability to maintain oral hydration    Need for , IV fluids, IV pain medications     Level of care     tele  For 12H   Shavona Gunderman 11/02/2023,  11:50 PM    [facility]     after 2 AM please page floor coverage   If 7AM-7PM, please contact the day team taking care of the patient using [email or web address]     '

## 2023-11-02 NOTE — Progress Notes (Signed)
 Nutrition follow up could not be completed. Patient to be admitted to the hospital.

## 2023-11-02 NOTE — Assessment & Plan Note (Signed)
 Resume statins when able to tolerate

## 2023-11-02 NOTE — Assessment & Plan Note (Signed)
 Will need CT to further evel, recurrent issue, no vomiting now  Pt refused NG  GI to see in AM  If confirmed on CT may benefit from surgery consult too

## 2023-11-02 NOTE — Progress Notes (Signed)
 Taking ice chips as needed.

## 2023-11-02 NOTE — Assessment & Plan Note (Signed)
 Hold off on CPAP while possible SBO

## 2023-11-02 NOTE — Assessment & Plan Note (Signed)
 Allow permissive hypertension for today ?

## 2023-11-02 NOTE — Progress Notes (Signed)
 [facility] Spiritual Care Note  Referred by Mallie Cooter for patient and family support. Reached Mr Gomillion's wife by phone while he was sleeping. She was appreciative of call, recognizing chaplain's name and helpfulness of referral. Due to Mr Rust's pressing physical needs and associated consult with Dr Federico, we plan to follow up by phone in 1-2 weeks, per her request.   Chaplain Olam Corrigan, MDiv, [facility] Pager [phone] Voicemail [phone]

## 2023-11-02 NOTE — Progress Notes (Signed)
 [facility] [phone]   [phone]  PROGRESS NOTE  Patient Care Team: David Skates, DO as PCP - General (Internal Medicine) David Heinz, MD as PCP - Cardiology (Cardiology)  Hematological/Oncological History #  IgG Kappa multiple myeloma.    ONCOLOGIC/HEMATOLOGIC HISTORY: 06/08/2023: Underwent CT CAP during admission for acute hypoxic respiratory failure in the setting of Influenza A and CAP vs aspiration pneumonia. Findings showed scattered bony lytic lesions in L 4th rib, pelvis including right ilium and thoracic spine.  06/09/2023: Labs collected during admission includes protein electrophoresis did not observe an M spike. UPEP 24 hour urine showed elevated free kappa light chains (611.43) and free lambda light chains (78.28) with a normal free kappa/lambda ratio.  07/13/2023: Established care with [facility] SPEP detected M protein measuring 0.3 g/dL. IFE showed IgG monoclonal protein with kappa light chain specificity. Serum free light chains showed elevated kappa light chain measuring 512.1, lambda light chain 20.3, ratio 25.23.  07/31/2023: Bone marrow biopsy revealed hypercellular bone marrow with plasma cell neoplasm representing 28% of all cells.  08/17/2023: Cycle 1, Day 1 of Velcade /Dex 09/21/2023: Cycle 2, Day 1 of Velcade /Rev/Dex  Interval History:  David Mcgrath 73 y.o. male with medical history significant for IgG Kappa MM who presents for a follow up visit. The patient's last visit was on 10/26/2023. In the interim since the last visit he has developed severe nausea, vomiting, and diarrhea and was seen yesterday in symptom management clinic for electrolyte repletion.  On exam today David Mcgrath is accompanied by his wife. He reports he initially tolerated his last treatment well but then on Saturday he began developing severe diarrhea.  He reports her was profuse watery diarrhea with no blood or black stools.  There is  no maroon-colored stools.  He reports he be also began vomiting 4-5 times per day.  He notes that he was seen in symptom management yesterday and did feel better after receiving the fluids but has not yet had a resolution of his other symptoms.  The diarrhea has stopped.  He does continue to have poor tolerance of both solids and liquids.  He reports before his vomit was quite black but now it is more bilious.  He reports that he is okay with being admitted to the hospital for further workup and evaluation of his symptoms.  MEDICAL HISTORY:  Past Medical History:  Diagnosis Date   Acute hypoxic respiratory failure (HCC) 06/04/2023   AKI (acute kidney injury) (HCC) 06/04/2023   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/04/2018   Depression 02/25/2014   Managed well with Prozac  20 mg po daily.  Patient reports he does not have symptoms as of 02/25/14.     Diarrhea 06/04/2023   Essential hypertension 06/04/2023   Fatty liver 06/04/2023   Flu 06/04/2023   Hyperlipidemia 06/04/2023   Ileus (HCC) 06/04/2023   Neuropathy    Paroxysmal atrial fibrillation (HCC) 07/04/2023   Pneumonia 06/04/2023    SURGICAL HISTORY: Past Surgical History:  Procedure Laterality Date   IR BONE MARROW BIOPSY & ASPIRATION  07/31/2023   NO PAST SURGERIES      SOCIAL HISTORY: Social History   Socioeconomic History   Marital status: Married    Spouse name: Almarie   Number of children: 0   Years of education: Not on file   Highest education level: Not on file  Occupational History   Occupation: Event organiser  Tobacco Use   Smoking status: Never  Smokeless tobacco: Never  Substance and Sexual Activity   Alcohol  use: Yes    Comment: socialy   Drug use: No   Sexual activity: Not on file  Other Topics Concern   Not on file  Social History Narrative   Lives with spouse   Right handed   Drinks 4+ cups of caffeine daily   Retired    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Corporate investment banker Strain: Not on file   Food Insecurity: No Food Insecurity (10/14/2023)   Hunger Vital Sign    Worried About Running Out of Food in the Last Year: Never true    Ran Out of Food in the Last Year: Never true  Transportation Needs: No Transportation Needs (10/14/2023)   PRAPARE - Administrator, Civil Service (Medical): No    Lack of Transportation (Non-Medical): No  Physical Activity: Not on file  Stress: Not on file  Social Connections: Socially Integrated (10/14/2023)   Social Connection and Isolation Panel    Frequency of Communication with Friends and Family: More than three times a week    Frequency of Social Gatherings with Friends and Family: Twice a week    Attends Religious Services: 1 to 4 times per year    Active Member of [employer] or Organizations: Yes    Attends Engineer, structural: More than 4 times per year    Marital Status: Married  Catering manager Violence: Not At Risk (10/14/2023)   Humiliation, Afraid, Rape, and Kick questionnaire    Fear of Current or Ex-Partner: No    Emotionally Abused: No    Physically Abused: No    Sexually Abused: No    FAMILY HISTORY: Family History  Problem Relation Age of Onset   Diabetes Mother    Cancer Mother    Heart disease Mother    Cancer Father    Hyperlipidemia Father    Neuropathy Paternal Uncle    Migraines Neg Hx     ALLERGIES:  is allergic to shrimp [shellfish allergy].  MEDICATIONS:  Current Outpatient Medications  Medication Sig Dispense Refill   acyclovir  (ZOVIRAX ) 400 MG tablet Take 1 tablet (400 mg total) by mouth 2 (two) times daily. 60 tablet 3   allopurinol  (ZYLOPRIM ) 300 MG tablet Take 300 mg by mouth daily.     apixaban  (ELIQUIS ) 5 MG TABS tablet Take 1 tablet (5 mg total) by mouth 2 (two) times daily. 60 tablet 12   calcium -vitamin D (OSCAL WITH D) 500-5 MG-MCG tablet Take 1 tablet by mouth 2 (two) times daily. 60 tablet 3   Cholecalciferol (VITAMIN D-3) 125 MCG (5000 UT) TABS Take 5,000 Units by mouth daily.      Cyanocobalamin  (VITAMIN B12) 1000 MCG TBCR Take 1,000 mcg by mouth daily.     dexamethasone  (DECADRON ) 4 MG tablet Take 40 mg by mouth once a week on day of Velcade  injection. (10 tablets) 40 tablet 2   diphenoxylate -atropine  (LOMOTIL ) 2.5-0.025 MG tablet Take 1 tablet by mouth 4 (four) times daily as needed for diarrhea or loose stools. 120 tablet 0   ezetimibe (ZETIA) 10 MG tablet Take 10 mg by mouth every evening.     FLUoxetine  (PROZAC ) 20 MG capsule Take 20 mg by mouth daily.     fluticasone (FLONASE) 50 MCG/ACT nasal spray Place 1 spray into both nostrils daily.     gabapentin  (NEURONTIN ) 300 MG capsule Take 2 capsules (600 mg total) by mouth 2 (two) times daily. (Patient taking differently: Take 600 mg  by mouth 3 (three) times daily.)     guaiFENesin  (MUCINEX ) 600 MG 12 hr tablet Take 600 mg by mouth daily.     guaifenesin  (ROBITUSSIN) 100 MG/5ML syrup Take 200 mg by mouth daily as needed for cough.     lenalidomide  (REVLIMID ) 10 MG capsule Take 1 capsule (10 mg total) by mouth daily. Bertrum Barrows #   [id number]   Date Obtained 11/01/23 Take one daily for 21 days then none for 7 days. 21 capsule 0   melatonin 5 MG TABS Take 5 mg by mouth at bedtime.     metoprolol  tartrate (LOPRESSOR ) 25 MG tablet Take 0.5 tablets (12.5 mg total) by mouth daily as needed (For persistent palpitation). 30 tablet 0   omeprazole (PRILOSEC) 40 MG capsule Take 40 mg by mouth daily.     ondansetron  (ZOFRAN ) 8 MG tablet Take 1 tablet (8 mg total) by mouth every 8 (eight) hours as needed for nausea or vomiting. 30 tablet 1   potassium chloride  SA (KLOR-CON  M) 20 MEQ tablet Take 1 tablet (20 mEq total) by mouth 2 (two) times daily. 60 tablet 2   pravastatin (PRAVACHOL) 80 MG tablet Take 80 mg by mouth at bedtime.     prochlorperazine  (COMPAZINE ) 10 MG tablet Take 1 tablet (10 mg total) by mouth every 6 (six) hours as needed for nausea or vomiting. 30 tablet 1   pyridoxine (B-6) 100 MG tablet Take 100 mg by mouth  daily.     No current facility-administered medications for this visit.    REVIEW OF SYSTEMS:   Constitutional: ( - ) fevers, ( - )  chills , ( - ) night sweats Eyes: ( - ) blurriness of vision, ( - ) double vision, ( - ) watery eyes Ears, nose, mouth, throat, and face: ( - ) mucositis, ( - ) sore throat Respiratory: ( - ) cough, ( - ) dyspnea, ( - ) wheezes Cardiovascular: ( - ) palpitation, ( - ) chest discomfort, ( - ) lower extremity swelling Gastrointestinal:  ( - ) nausea, ( - ) heartburn, ( - ) change in bowel habits Skin: ( - ) abnormal skin rashes Lymphatics: ( - ) new lymphadenopathy, ( - ) easy bruising Neurological: ( - ) numbness, ( - ) tingling, ( - ) new weaknesses Behavioral/Psych: ( - ) mood change, ( - ) new changes  All other systems were reviewed with the patient and are negative.  PHYSICAL EXAMINATION:  Vitals:   11/02/23 1508  BP: 120/61  Pulse: 67  Resp: 16  Temp: 98.3 F (36.8 C)  SpO2: 97%    Filed Weights   GENERAL: Well-appearing elderly Caucasian male, alert, no distress and comfortable SKIN: skin color, texture, turgor are normal, no rashes or significant lesions EYES: conjunctiva are pink and non-injected, sclera clear LUNGS: clear to auscultation and percussion with normal breathing effort HEART: regular rate & rhythm and no murmurs and no lower extremity edema Musculoskeletal: no cyanosis of digits and no clubbing  PSYCH: alert & oriented x 3, fluent speech NEURO: no focal motor/sensory deficits  LABORATORY DATA:  I have reviewed the data as listed    Latest Ref Rng & Units 11/02/2023    2:43 PM 11/01/2023   11:25 AM 10/26/2023    2:47 PM  CBC  WBC 4.0 - 10.5 K/uL 10.8  12.5  7.5   Hemoglobin 13.0 - 17.0 g/dL 87.5  87.7  89.8   Hematocrit 39.0 - 52.0 % 36.2  35.2  30.1   Platelets 150 - 400 K/uL 329  316  390        Latest Ref Rng & Units 11/02/2023    2:43 PM 11/01/2023   11:25 AM 10/26/2023    2:47 PM  CMP  Glucose 70 - 99 mg/dL  888  897  862   BUN 8 - 23 mg/dL 11  11  8    Creatinine 0.61 - 1.24 mg/dL 9.25  9.14  9.26   Sodium 135 - 145 mmol/L 135  135  134   Potassium 3.5 - 5.1 mmol/L 3.5  3.0  4.5   Chloride 98 - 111 mmol/L 101  102  105   CO2 22 - 32 mmol/L 21  22  22    Calcium  8.9 - 10.3 mg/dL 6.7  6.6  7.5   Total Protein 6.5 - 8.1 g/dL 5.8  5.4  5.4   Total Bilirubin 0.0 - 1.2 mg/dL 0.5  0.5  0.3   Alkaline Phos 38 - 126 U/L 62  57  55   AST 15 - 41 U/L 27  27  26    ALT 0 - 44 U/L 30  31  24      Lab Results  Component Value Date   MPROTEIN Not Observed 10/26/2023   MPROTEIN Not Observed 09/28/2023   MPROTEIN Not Observed 09/07/2023   Lab Results  Component Value Date   KPAFRELGTCHN 51.7 (H) 10/26/2023   KPAFRELGTCHN 48.0 (H) 09/28/2023   KPAFRELGTCHN 41.8 (H) 09/07/2023   LAMBDASER 45.6 (H) 10/26/2023   LAMBDASER 31.4 (H) 09/28/2023   LAMBDASER 17.4 09/07/2023   KAPLAMBRATIO 1.13 10/26/2023   KAPLAMBRATIO 1.53 09/28/2023   KAPLAMBRATIO 2.40 (H) 09/07/2023   RADIOGRAPHIC STUDIES: DG Abd Portable 1V Result Date: 10/18/2023 CLINICAL DATA:  73 year old male with suspected small-bowel obstruction on recent CTA. EXAM: PORTABLE ABDOMEN - 1 VIEW COMPARISON:  Abdominal radiographs yesterday and earlier. FINDINGS: Portable AP supine view at 0752 hours. Enteric tube now is visible in the left upper quadrant, terminating in the stomach. Ongoing gas distended bowel loops in the abdomen and pelvis, most of which appears to be small bowel. Mid abdominal small bowel loops remain dilated approximately 5.5 cm diameter. Bowel-gas pattern has not significantly improved compared to the CTA on 10/14/2023. No pneumoperitoneum is evident on these supine views. Stable visualized osseous structures. IMPRESSION: 1. Enteric tube terminates in the stomach. 2. No improvement in gas distended mostly small bowel loops in the abdomen and pelvis. Pattern currently favors small-bowel obstruction over ileus. Electronically Signed   By:  VEAR Hurst M.D.   On: 10/18/2023 08:57   DG Abd Portable 1V Result Date: 10/17/2023 CLINICAL DATA:  73 year old male with suspected small-bowel obstruction on CTA abdomen and pelvis 3 days ago. EXAM: PORTABLE ABDOMEN - 1 VIEW COMPARISON:  10/16/2023 radiographs and earlier. FINDINGS: Portable AP supine view at 0618 hours. Retained stool in the rectum has increased since the prior CT. Bowel-gas pattern otherwise not significantly changed since that time. Ileus versus small-bowel obstruction. No pneumoperitoneum identified on these supine views. Stable visualized osseous structures. IMPRESSION: Stable gas pattern suggesting ileus versus small-bowel obstruction. Increased retained stool in the rectum from the recent CT. Electronically Signed   By: VEAR Hurst M.D.   On: 10/17/2023 10:10   DG Abd Portable 1V Result Date: 10/16/2023 CLINICAL DATA:  Small-bowel obstruction EXAM: PORTABLE ABDOMEN - 1 VIEW COMPARISON:  Abdominal radiograph dated 10/15/2023 FINDINGS: Partially imaged enteric tube tip projects over the left upper quadrant, likely within  the stomach. Similar diffuse gas-filled bowel dilation throughout the abdomen. Gas is seen at the level of the rectum. IMPRESSION: Similar diffuse gas-filled bowel dilation throughout the abdomen, which may represent small-bowel obstruction or ileus. Electronically Signed   By: Limin  Xu M.D.   On: 10/16/2023 10:55   DG Abd Portable 1V-Small Bowel Obstruction Protocol-initial, 8 hr delay Result Date: 10/15/2023 CLINICAL DATA:  Small-bowel obstruction-8 hour delay EXAM: PORTABLE ABDOMEN - 1 VIEW COMPARISON:  CT abdomen pelvis 10/14/2023 FINDINGS: Persistent dilation of the small bowel. Contrast is not definitively visualized the on the stomach. Contrast in the bladder from prior CT. IMPRESSION: Persistent small bowel obstruction. Electronically Signed   By: Norman Gatlin M.D.   On: 10/15/2023 18:39   CT Angio Abd/Pel W and/or Wo Contrast Result Date: 10/14/2023 CLINICAL  DATA:  Lower GI bleed. Coffee ground emesis. History of multiple myeloma. EXAM: CTA ABDOMEN AND PELVIS WITHOUT AND WITH CONTRAST TECHNIQUE: Multidetector CT imaging of the abdomen and pelvis was performed using the standard protocol during bolus administration of intravenous contrast. Multiplanar reconstructed images and MIPs were obtained and reviewed to evaluate the vascular anatomy. RADIATION DOSE REDUCTION: This exam was performed according to the departmental dose-optimization program which includes automated exposure control, adjustment of the mA and/or kV according to patient size and/or use of iterative reconstruction technique. CONTRAST:  OMNIPAQUE  IOHEXOL  350 MG/ML SOLN COMPARISON:  CT 06/08/2023 FINDINGS: VASCULAR Aorta: Normal caliber aorta without aneurysm, dissection, vasculitis or significant stenosis. Moderate aortic atherosclerosis. Celiac: Dense plaque at the origin causes high-grade stenosis with mild poststenotic dilatation. No dissection or aneurysm. SMA: Patent without evidence of aneurysm, dissection, vasculitis or significant stenosis. Renals: Mild plaque at the origin of both renal arteries without significant stenosis. Both renal arteries are patent without evidence of aneurysm, dissection, vasculitis, or fibromuscular dysplasia. IMA: Patent without evidence of aneurysm, dissection, vasculitis or significant stenosis. Inflow: Coarse plaque at the origin of the common iliac arteries causes less than 50% stenosis. Distal branches are patent. Proximal Outflow: Plaque in the left femoral artery without severe stenosis. No dissection or acute findings. Veins: Venous phase imaging demonstrates no acute findings. Patent portal and splenic veins. Patent mesenteric veins. No portal venous or mesenteric gas. Review of the MIP images confirms the above findings. NON-VASCULAR Lower chest: Minimal left pleural thickening is unchanged from prior exam. Dependent ground-glass opacities may represent  hypoventilatory change or infection. Hepatobiliary: No focal liver abnormality is seen. No gallstones, gallbladder wall thickening, or biliary dilatation. Pancreas: No ductal dilatation or inflammation. Spleen: Normal in size without focal abnormality. Adrenals/Urinary Tract: Normal adrenal glands. No hydronephrosis or renal calculi. No suspicious renal abnormality. The urinary bladder is partially distended, mildly thick walled. Stomach/Bowel: No contrast accumulation in the GI tract to localize site of GI bleed. Progressive small bowel distension with suspected transition point in the right hemiabdomen, series 8, image 97. There is adjacent mesenteric edema. Small bowel distal to this is decompressed. Small bowel proximally is dilated and fluid-filled. The stomach is dilated and fluid-filled. Fluid distention of the included distal esophagus. The transverse colon is tortuous, courses posterior to the stomach. Question of underlying mesenteric defect in internal hernia, but no wall thickening or obstruction at this point. Mild left colonic diverticulosis without diverticulitis. The appendix is not confidently visualized. Lymphatic: No enlarged lymph nodes in the abdomen or pelvis. Reproductive: Prostate is unremarkable. Other: Small volume abdominopelvic ascites, new from prior exam. Generalized body wall edema. There is fluid within both inguinal canals. No free  air or focal fluid collection. Small left lumbar hernia contains only fat. Musculoskeletal: Scattered lytic lesions are again seen, not significantly changed. No evidence of pathologic fracture or compression deformity. Multilevel degenerative change in the spine. IMPRESSION: 1. No contrast accumulation in the GI tract to localize site of GI bleed. 2. Progressive small bowel distension with suspected transition point in the right hemiabdomen, suspicious for small bowel obstruction. 3. Question of internal hernia with transverse colon coursing posterior to  the stomach, but no inflammation or obstructive change at this site. 4. Small volume abdominopelvic ascites, new from prior exam. Generalized body wall edema/anasarca. 5. Dependent ground-glass opacities in the lung bases may represent hypoventilatory change or infection. Aspiration is also considered. 6. Known lytic lesions, without significant change. Aortic Atherosclerosis (ICD10-I70.0). Electronically Signed   By: Andrea Gasman M.D.   On: 10/14/2023 15:58   DG Chest Portable 1 View Result Date: 10/14/2023 CLINICAL DATA:  repeated forceful vomiting EXAM: PORTABLE CHEST 1 VIEW COMPARISON:  June 04, 2023, June 08, 2023 FINDINGS: The cardiomediastinal silhouette is unchanged in contour. No pleural effusion. No pneumothorax. Patchy bibasilar platelike opacities, favored to reflect atelectasis. Low lung volume radiograph. Rounded opacity at the LEFT lateral approximate seventh rib, likely a lytic lesion this level. Gaseous distension of bowel beneath the diaphragm. Possible pathologic fracture of the RIGHT posterior sixth rib, similar compared to prior IMPRESSION: 1. Low lung volume radiograph with bibasilar atelectasis. 2. Rounded opacity at the LEFT lateral approximate seventh rib, likely a lytic lesion. Possible pathologic fracture of the RIGHT posterior sixth rib, similar compared to prior. 3. If clinical concern for intraperitoneal free air, recommend dedicated cross-sectional imaging. Electronically Signed   By: Corean Salter M.D.   On: 10/14/2023 14:18    ASSESSMENT & PLAN Tykee Heideman Mcgrath 73 y.o. male with medical history significant for IgG Kappa MM who presents for a follow up visit.   # Intractable Nausea/ Vomiting # Diarrhea # Poor PO Intake -- Patient seen in symptom management clinic yesterday with electrolyte repletion.  Electrolytes do appear improved today -- Patient continues to have nausea and vomiting is not able to keep down food or liquid -- Patient was initially  having severe diarrhea starting on Saturday but this has subsequently stopped. -- Patient seen previously in hospital on concern for bowel obstruction.  Recommend admission with consideration of image and consult to GI/surgery -- Oncology will follow patient in house.  # IgG Kappa Multiple Myeloma: -Initially presented with scattered bony lytic lesions on CT CAP from 06/08/2023.  -Baseline labs from 07/13/2023 included SPEP detecting M protein measuring 0.3 g/dL. Kappa light chain elevated to 512.1, lambda light chain 20.3, ratio 25.23.  -Bone met survey from 07/20/2023 showed rxpansile lucent lesions in the right T1 transverse process, posterior right sixth rib, and left posteromedial fourth rib -Bone marrow biopsy confirmed plasma cell neoplasm measuring 28% of all cells.  -Recommend Velcade  plus Dexamethasone  therapy, started on 08/17/2023. Added Revlimid  during Cycle 2  PLAN: --Due for Cycle 4, Day 8 of Velcade /Rev/Dexamethasone  today. HOLDING Revlimid  indefinitely due to difficulty tolerating medication.  Will hold chemotherapy treatment today due to intractable nausea and vomiting.  Will likely add Darzalex to his Velcade  for future treatments. --Labs from today were reviewed and showed ** --Drop Velcade  to 1 mg/m due to diarrhea and continue this dose moving forward. --Currently patient is receiving 40 mg p.o. Dex on treatment days (is experiencing insomnia)  --RTC in 2 weeks for consideration of restarting treatment.   #  Diarrhea -- Modest improvement with dose reduction, patient not taking Lomotil  -- If we are unable to control his diarrhea could consider discontinuing Velcade  and transitioning to Darzalex -- Patient maintains good appetite and hydration. -- Continue to monitor  # Peripheral neuropathy: --Evaluated by neurology before MM diagnosis.  --EMG nerve conduction study done on 01/30/2020 by Dr. Margaret confirms severe axonal sensorimotor polyneuropathy as well as moderate  right carpal tunnel.  --Currently on gabapentin  600 mg TID.  --Monitor closely with initiation of velcade  as neuropathy can worsen.    #Supportive Care -- chemotherapy education to be scheduled  -- zofran  8mg  q8H PRN and compazine  10mg  PO q6H for nausea -- acyclovir  400mg  PO BID for VCZ prophylaxis --  added Zometa , we have dental clearance on file. First dose on 09/07/2023.  Due again in September 2025 -- no pain medication required at this time.    #Age related screenings -Colorectal screening UTD. -PSA from 07/13/2023 was normal at 0.4.   Orders Placed This Encounter  Procedures   Multiple Myeloma Panel (SPEP&IFE w/QIG)    Standing Status:   Future    Expected Date:   12/22/2023    Expiration Date:   12/21/2024   Kappa/lambda light chains    Standing Status:   Future    Expected Date:   12/22/2023    Expiration Date:   12/21/2024   CBC with Differential (Cancer Center Only)    Standing Status:   Future    Expected Date:   12/22/2023    Expiration Date:   12/21/2024   CMP (Cancer Center only)    Standing Status:   Future    Expected Date:   12/22/2023    Expiration Date:   12/21/2024   CBC with Differential (Cancer Center Only)    Standing Status:   Future    Expected Date:   12/29/2023    Expiration Date:   12/28/2024   CMP (Cancer Center only)    Standing Status:   Future    Expected Date:   12/29/2023    Expiration Date:   12/28/2024   CBC with Differential (Cancer Center Only)    Standing Status:   Future    Expected Date:   01/05/2024    Expiration Date:   01/04/2025   CMP (Cancer Center only)    Standing Status:   Future    Expected Date:   01/05/2024    Expiration Date:   01/04/2025    All questions were answered. The patient knows to call the clinic with any problems, questions or concerns.  A total of more than 30 minutes were spent on this encounter with face-to-face time and non-face-to-face time, including preparing to see the patient, ordering tests and/or  medications, counseling the patient and coordination of care as outlined above.   David IVAR Kidney, MD Department of Hematology/Oncology [facility] Phone: [phone] Pager: [phone] Email: [email or web address]  11/02/2023 4:23 PM

## 2023-11-02 NOTE — Assessment & Plan Note (Signed)
 Obtain imaging to rule out small bowel obstruction

## 2023-11-02 NOTE — Progress Notes (Signed)
 Pt being admitted to 1608 for intractable nausea and vomiting, r/o SBO. Report called to [place], Charity fundraiser. Transported via w/c. Wife to meet pt in his room after retrieving her care from valet parking.

## 2023-11-02 NOTE — Assessment & Plan Note (Signed)
 Will rehydrate and recheck fluid status

## 2023-11-02 NOTE — Assessment & Plan Note (Signed)
 Appreciate Dr. Federico involvement oncology will follow up in the morning

## 2023-11-02 NOTE — Subjective & Objective (Signed)
 Patient was seen at oncology clinic today for follow-up for IgG Kappa MM  Patient has been having nausea vomiting and diarrhea had to be seen recently for electrolyte repletion Has been having profuse watery diarrhea no blood no black stools Has been vomiting 4 to 5-day ems a day Was seen in clinic received IV fluid bolus diarrhea has stopped but he still has poor p.o. intake bilious vomiting patient wanted to be admitted to the hospital oncology arrange for direct admission His medical history is consistent with history of aspiration pneumonia in March 2025 when he was found to have lytic lesions later on he was found to have elevated free kappa light chains and establish care with oncology has been getting Velcade /Rev/Dex cycle 2 was on 19th of June

## 2023-11-02 NOTE — Assessment & Plan Note (Signed)
 Hold Eliquis  for now until small bowel obstruction evaluated. Patient may need further GI evaluation.

## 2023-11-02 NOTE — Assessment & Plan Note (Signed)
 Hold Neurontin  for tonight until had further evaluation the patient is able to tolerate p.o.

## 2023-11-03 ENCOUNTER — Inpatient Hospital Stay (HOSPITAL_COMMUNITY)

## 2023-11-03 ENCOUNTER — Encounter (HOSPITAL_COMMUNITY)
Admission: EM | Disposition: A | Discharge status: Home / Self Care | Payer: Self-pay | Source: Ambulatory Visit | Attending: Internal Medicine

## 2023-11-03 ENCOUNTER — Encounter (HOSPITAL_COMMUNITY): Payer: Self-pay | Admitting: Internal Medicine

## 2023-11-03 DIAGNOSIS — K56609 Unspecified intestinal obstruction, unspecified as to partial versus complete obstruction: Secondary | ICD-10-CM

## 2023-11-03 DIAGNOSIS — K529 Noninfective gastroenteritis and colitis, unspecified: Secondary | ICD-10-CM | POA: Diagnosis not present

## 2023-11-03 DIAGNOSIS — E860 Dehydration: Secondary | ICD-10-CM | POA: Diagnosis not present

## 2023-11-03 DIAGNOSIS — I10 Essential (primary) hypertension: Secondary | ICD-10-CM | POA: Diagnosis not present

## 2023-11-03 DIAGNOSIS — I480 Paroxysmal atrial fibrillation: Secondary | ICD-10-CM | POA: Diagnosis not present

## 2023-11-03 DIAGNOSIS — I4891 Unspecified atrial fibrillation: Secondary | ICD-10-CM

## 2023-11-03 HISTORY — PX: LAPAROSCOPY: SHX197

## 2023-11-03 LAB — COMPREHENSIVE METABOLIC PANEL WITH GFR
ALT: 25 U/L (ref 0–44)
AST: 25 U/L (ref 15–41)
Albumin: 2.2 g/dL — ABNORMAL LOW (ref 3.5–5.0)
Alkaline Phosphatase: 53 U/L (ref 38–126)
Anion gap: 12 (ref 5–15)
BUN: 12 mg/dL (ref 8–23)
CO2: 19 mmol/L — ABNORMAL LOW (ref 22–32)
Calcium: 6.4 mg/dL — CL (ref 8.9–10.3)
Chloride: 104 mmol/L (ref 98–111)
Creatinine, Ser: 0.78 mg/dL (ref 0.61–1.24)
GFR, Estimated: >60 mL/min (ref >60)
Glucose, Bld: 81 mg/dL (ref 70–99)
Potassium: 3.7 mmol/L (ref 3.5–5.1)
Sodium: 135 mmol/L (ref 135–145)
Total Bilirubin: 0.9 mg/dL (ref 0.0–1.2)
Total Protein: 4.9 g/dL — ABNORMAL LOW (ref 6.5–8.1)

## 2023-11-03 LAB — PHOSPHORUS: Phosphorus: 2.6 mg/dL (ref 2.5–4.6)

## 2023-11-03 LAB — CBC
HCT: 32.6 % — ABNORMAL LOW (ref 39.0–52.0)
Hemoglobin: 10.5 g/dL — ABNORMAL LOW (ref 13.0–17.0)
MCH: 30.7 pg (ref 26.0–34.0)
MCHC: 32.2 g/dL (ref 30.0–36.0)
MCV: 95.3 fL (ref 80.0–100.0)
Platelets: 243 K/uL (ref 150–400)
RBC: 3.42 MIL/uL — ABNORMAL LOW (ref 4.22–5.81)
RDW: 16.7 % — ABNORMAL HIGH (ref 11.5–15.5)
WBC: 8.7 K/uL (ref 4.0–10.5)
nRBC: 0 % (ref 0.0–0.2)

## 2023-11-03 LAB — TYPE AND SCREEN
ABO/RH(D): O POS
Antibody Screen: NEGATIVE

## 2023-11-03 LAB — MAGNESIUM: Magnesium: 1.8 mg/dL (ref 1.7–2.4)

## 2023-11-03 SURGERY — LAPAROSCOPY, DIAGNOSTIC
Anesthesia: General

## 2023-11-03 MED ORDER — ALBUMIN HUMAN 5 % IV SOLN: INTRAVENOUS | Status: DC | PRN

## 2023-11-03 MED ORDER — FENTANYL CITRATE (PF) 100 MCG/2ML IJ SOLN
INTRAMUSCULAR | Status: DC | PRN
  Administered 2023-11-03: 50 ug via INTRAVENOUS

## 2023-11-03 MED ORDER — HYDROMORPHONE HCL 1 MG/ML IJ SOLN: 0.5000 mg | INTRAMUSCULAR | Status: DC | PRN

## 2023-11-03 MED ORDER — ROCURONIUM BROMIDE 100 MG/10ML IV SOLN
INTRAVENOUS | Status: DC | PRN
  Administered 2023-11-03: 30 mg via INTRAVENOUS

## 2023-11-03 MED ORDER — EPHEDRINE 5 MG/ML INJ
INTRAVENOUS | Status: AC
  Filled 2023-11-03: qty 5

## 2023-11-03 MED ORDER — ALBUMIN HUMAN 5 % IV SOLN
INTRAVENOUS | Status: AC
  Filled 2023-11-03: qty 250

## 2023-11-03 MED ORDER — LIDOCAINE HCL (PF) 2 % IJ SOLN
INTRAMUSCULAR | Status: AC
  Filled 2023-11-03: qty 5

## 2023-11-03 MED ORDER — DEXAMETHASONE SODIUM PHOSPHATE 10 MG/ML IJ SOLN
INTRAMUSCULAR | Status: AC
  Filled 2023-11-03: qty 1

## 2023-11-03 MED ORDER — SUCCINYLCHOLINE CHLORIDE 200 MG/10ML IV SOSY
PREFILLED_SYRINGE | INTRAVENOUS | Status: DC | PRN
Start: 2023-11-03 — End: 2023-11-03
  Administered 2023-11-03: 100 mg via INTRAVENOUS

## 2023-11-03 MED ORDER — POTASSIUM PHOSPHATES 15 MMOLE/5ML IV SOLN
15.0000 mmol | Freq: Once | INTRAVENOUS | Status: AC
  Administered 2023-11-03: 15 mmol via INTRAVENOUS
  Filled 2023-11-03: qty 5

## 2023-11-03 MED ORDER — DROPERIDOL 2.5 MG/ML IJ SOLN: 0.6250 mg | Freq: Once | INTRAMUSCULAR | Status: DC | PRN

## 2023-11-03 MED ORDER — PROPOFOL 10 MG/ML IV BOLUS
INTRAVENOUS | Status: AC
  Filled 2023-11-03: qty 20

## 2023-11-03 MED ORDER — SUGAMMADEX SODIUM 200 MG/2ML IV SOLN
INTRAVENOUS | Status: AC
  Filled 2023-11-03: qty 2

## 2023-11-03 MED ORDER — OXYCODONE HCL 5 MG/5ML PO SOLN: 5.0000 mg | Freq: Once | ORAL | Status: DC | PRN

## 2023-11-03 MED ORDER — OXYCODONE HCL 5 MG PO TABS: 5.0000 mg | ORAL_TABLET | Freq: Once | ORAL | Status: DC | PRN

## 2023-11-03 MED ORDER — SUGAMMADEX SODIUM 200 MG/2ML IV SOLN
INTRAVENOUS | Status: DC | PRN
Start: 2023-11-03 — End: 2023-11-03
  Administered 2023-11-03: 200 mg via INTRAVENOUS

## 2023-11-03 MED ORDER — PROPOFOL 10 MG/ML IV BOLUS
INTRAVENOUS | Status: DC | PRN
  Administered 2023-11-03: 150 mg via INTRAVENOUS

## 2023-11-03 MED ORDER — MIDAZOLAM HCL 2 MG/2ML IJ SOLN
INTRAMUSCULAR | Status: AC
  Filled 2023-11-03: qty 2

## 2023-11-03 MED ORDER — BUPIVACAINE-EPINEPHRINE 0.25% -1:200000 IJ SOLN
INTRAMUSCULAR | Status: DC | PRN
  Administered 2023-11-03: 20 mL

## 2023-11-03 MED ORDER — PHENYLEPHRINE HCL (PRESSORS) 10 MG/ML IV SOLN
INTRAVENOUS | Status: DC | PRN
Start: 2023-11-03 — End: 2023-11-03
  Administered 2023-11-03: 160 ug via INTRAVENOUS

## 2023-11-03 MED ORDER — BUPIVACAINE-EPINEPHRINE (PF) 0.25% -1:200000 IJ SOLN
INTRAMUSCULAR | Status: AC
  Filled 2023-11-03: qty 30

## 2023-11-03 MED ORDER — PHENYLEPHRINE 80 MCG/ML (10ML) SYRINGE FOR IV PUSH (FOR BLOOD PRESSURE SUPPORT)
PREFILLED_SYRINGE | INTRAVENOUS | Status: AC
  Filled 2023-11-03: qty 10

## 2023-11-03 MED ORDER — ONDANSETRON HCL 4 MG/2ML IJ SOLN
INTRAMUSCULAR | Status: AC
  Filled 2023-11-03: qty 2

## 2023-11-03 MED ORDER — PHENYLEPHRINE HCL-NACL 20-0.9 MG/250ML-% IV SOLN
INTRAVENOUS | Status: DC | PRN
  Administered 2023-11-03: 25 ug/min via INTRAVENOUS

## 2023-11-03 MED ORDER — FENTANYL CITRATE PF 50 MCG/ML IJ SOSY: 25.0000 ug | PREFILLED_SYRINGE | INTRAMUSCULAR | Status: DC | PRN

## 2023-11-03 MED ORDER — DEXAMETHASONE SODIUM PHOSPHATE 10 MG/ML IJ SOLN
INTRAMUSCULAR | Status: DC | PRN
  Administered 2023-11-03: 8 mg via INTRAVENOUS

## 2023-11-03 MED ORDER — PHENYLEPHRINE 80 MCG/ML (10ML) SYRINGE FOR IV PUSH (FOR BLOOD PRESSURE SUPPORT)
PREFILLED_SYRINGE | INTRAVENOUS | Status: DC | PRN
  Administered 2023-11-03: 80 ug via INTRAVENOUS

## 2023-11-03 MED ORDER — 0.9 % SODIUM CHLORIDE (POUR BTL) OPTIME
TOPICAL | Status: DC | PRN
Start: 2023-11-03 — End: 2023-11-03
  Administered 2023-11-03: 1000 mL

## 2023-11-03 MED ORDER — IOHEXOL 300 MG/ML  SOLN
100.0000 mL | Freq: Once | INTRAMUSCULAR | Status: AC | PRN
  Administered 2023-11-03: 100 mL via INTRAVENOUS

## 2023-11-03 MED ORDER — CHLORHEXIDINE GLUCONATE 0.12 % MT SOLN
15.0000 mL | Freq: Once | OROMUCOSAL | Status: AC
  Administered 2023-11-03: 15 mL via OROMUCOSAL

## 2023-11-03 MED ORDER — LIDOCAINE HCL (CARDIAC) PF 100 MG/5ML IV SOSY
PREFILLED_SYRINGE | INTRAVENOUS | Status: DC | PRN
  Administered 2023-11-03: 100 mg via INTRAVENOUS

## 2023-11-03 MED ORDER — K PHOS MONO-SOD PHOS DI & MONO 155-852-130 MG PO TABS: 250.0000 mg | ORAL_TABLET | Freq: Once | ORAL | Status: DC

## 2023-11-03 MED ORDER — ROCURONIUM BROMIDE 10 MG/ML (PF) SYRINGE
PREFILLED_SYRINGE | INTRAVENOUS | Status: AC
  Filled 2023-11-03: qty 10

## 2023-11-03 MED ORDER — MIDAZOLAM HCL 5 MG/5ML IJ SOLN
INTRAMUSCULAR | Status: DC | PRN
  Administered 2023-11-03: 1 mg via INTRAVENOUS

## 2023-11-03 MED ORDER — TRAMADOL HCL 50 MG PO TABS: 50.0000 mg | ORAL_TABLET | Freq: Four times a day (QID) | ORAL | Status: DC | PRN

## 2023-11-03 MED ORDER — SODIUM CHLORIDE 0.9 % IV SOLN
2.0000 g | INTRAVENOUS | Status: AC
  Administered 2023-11-03: 2 g via INTRAVENOUS
  Filled 2023-11-03: qty 20

## 2023-11-03 MED ORDER — ACETAMINOPHEN 10 MG/ML IV SOLN: 1000.0000 mg | Freq: Once | INTRAVENOUS | Status: DC | PRN

## 2023-11-03 MED ORDER — LACTATED RINGERS IV SOLN: INTRAVENOUS | Status: DC

## 2023-11-03 MED ORDER — SODIUM CHLORIDE 0.9 % IV SOLN: INTRAVENOUS | Status: AC

## 2023-11-03 MED ORDER — FENTANYL CITRATE (PF) 100 MCG/2ML IJ SOLN
INTRAMUSCULAR | Status: AC
  Filled 2023-11-03: qty 2

## 2023-11-03 SURGICAL SUPPLY — 42 items
BAG COUNTER SPONGE SURGICOUNT (BAG) IMPLANT
BLADE EXTENDED COATED 6.5IN (ELECTRODE) IMPLANT
BLADE SURG SZ10 CARB STEEL (BLADE) IMPLANT
CELLS DAT CNTRL 66122 CELL SVR (MISCELLANEOUS) IMPLANT
CHLORAPREP W/TINT 26 (MISCELLANEOUS) ×1 IMPLANT
CLIP APPLIE 5 13 M/L LIGAMAX5 (MISCELLANEOUS) IMPLANT
CLIP APPLIE ROT 10 11.4 M/L (STAPLE) IMPLANT
COVER MAYO STAND STRL (DRAPES) IMPLANT
COVER SURGICAL LIGHT HANDLE (MISCELLANEOUS) ×1 IMPLANT
DERMABOND ADVANCED .7 DNX12 (GAUZE/BANDAGES/DRESSINGS) IMPLANT
DRAPE WARM FLUID 44X44 (DRAPES) IMPLANT
ELECT REM PT RETURN 15FT ADLT (MISCELLANEOUS) ×1 IMPLANT
GAUZE SPONGE 4X4 12PLY STRL (GAUZE/BANDAGES/DRESSINGS) ×1 IMPLANT
GOWN STRL REUS W/ TWL LRG LVL3 (GOWN DISPOSABLE) ×1 IMPLANT
GOWN STRL REUS W/ TWL XL LVL3 (GOWN DISPOSABLE) ×1 IMPLANT
HANDLE SUCTION POOLE (INSTRUMENTS) IMPLANT
IRRIGATION SUCT STRKRFLW 2 WTP (MISCELLANEOUS) IMPLANT
KIT BASIN OR (CUSTOM PROCEDURE TRAY) ×1 IMPLANT
KIT TURNOVER KIT A (KITS) ×1 IMPLANT
RETRACTOR WND ALEXIS 18 MED (MISCELLANEOUS) IMPLANT
SCISSORS LAP 5X35 DISP (ENDOMECHANICALS) ×1 IMPLANT
SHEARS HARMONIC 36 ACE (MISCELLANEOUS) IMPLANT
SLEEVE Z-THREAD 5X100MM (TROCAR) IMPLANT
SPIKE FLUID TRANSFER (MISCELLANEOUS) ×1 IMPLANT
STRIP CLOSURE SKIN 1/2X4 (GAUZE/BANDAGES/DRESSINGS) IMPLANT
SUT MNCRL AB 4-0 PS2 18 (SUTURE) IMPLANT
SUT PDS AB 1 TP1 96 (SUTURE) IMPLANT
SUT PROLENE 2 0 SH DA (SUTURE) IMPLANT
SUT SILK 2 0 SH CR/8 (SUTURE) IMPLANT
SUT SILK 2-0 18XBRD TIE 12 (SUTURE) IMPLANT
SUT SILK 3 0 SH CR/8 (SUTURE) IMPLANT
SUT SILK 3-0 18XBRD TIE 12 (SUTURE) IMPLANT
SYR BULB IRRIG 60ML STRL (SYRINGE) IMPLANT
SYSTEM LAPSCP GELPORT 120MM (MISCELLANEOUS) IMPLANT
TOWEL OR 17X26 10 PK STRL BLUE (TOWEL DISPOSABLE) ×1 IMPLANT
TRAY FOLEY MTR SLVR 16FR STAT (SET/KITS/TRAYS/PACK) ×1 IMPLANT
TRAY LAPAROSCOPIC (CUSTOM PROCEDURE TRAY) ×1 IMPLANT
TROCAR 11X100 Z THREAD (TROCAR) IMPLANT
TROCAR XCEL NON-BLD 5MMX100MML (ENDOMECHANICALS) IMPLANT
TROCAR Z THREAD OPTICAL 12X100 (TROCAR) IMPLANT
TROCAR Z-THREAD OPTICAL 5X100M (TROCAR) ×1 IMPLANT
YANKAUER SUCT BULB TIP NO VENT (SUCTIONS) IMPLANT

## 2023-11-03 NOTE — Consult Note (Signed)
 Consult/Admission Note  David Mcgrath 1950/12/03  [id number].    Requesting MD:  Dr. Eric [place], DO  Chief Complaint/Reason for Consult:  Nausea/Vomiting/SBO  HPI:  David Mcgrath is a 73 year old male that was a direct admit from [facility] cancer ct to Med-surg for intractable nausea and vomiting.   Of note, patient previously admitted from 10/14/2023-10/20/2023 due to SBO that was managed nonoperatively. Patient reports that once discharged, his symptoms improved.  However, on Saturday morning 7/27 he began to experience diarrhea that he thought was related to his multiple myeloma treatments. However, the diarrhea did not resolve. On Tuesday 7/29, patient experienced vomiting. He describes the emesis as bile like. Tuesday was the last time patient reports intake of food. Patient also reports significant abdominal pain yesterday that has now mostly resolved. Patient reports that he last had a bowel movement this morning. Describes bowel movement as diarrhea.   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  Patient reports that he last took Eliquis  Monday evening 7/28. Patient previously on hypertension medications but states these have been discontinued.  Patient denies previous abdominal surgeries. Reports some alcohol  consumption socially. Denies illicit drug use. Denies history or current use of tobacco products. No known drug allergies  ROS: All negative with the exception of above.   Family History  Problem Relation Age of Onset   Diabetes Mother    Cancer Mother    Heart disease Mother    Cancer Father    Hyperlipidemia Father    Neuropathy Paternal Uncle    Migraines Neg Hx     Past Medical History:  Diagnosis Date   Acute hypoxic respiratory failure (HCC) 06/04/2023   AKI (acute kidney  injury) (HCC) 06/04/2023   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/04/2018   Depression 02/25/2014   Managed well with Prozac  20 mg po daily.  Patient reports he does not have symptoms as of 02/25/14.     Diarrhea 06/04/2023   Essential hypertension 06/04/2023   Fatty liver 06/04/2023   Flu 06/04/2023   Hyperlipidemia 06/04/2023   Ileus (HCC) 06/04/2023   Neuropathy    Paroxysmal atrial fibrillation (HCC) 07/04/2023   Pneumonia 06/04/2023    Past Surgical History:  Procedure Laterality Date   IR BONE MARROW BIOPSY & ASPIRATION  07/31/2023   NO PAST SURGERIES      Social History:  reports that he has never smoked. He has never used smokeless tobacco. He reports current alcohol  use. He reports that he does not use drugs.  Allergies:  Allergies  Allergen Reactions   Shrimp [Shellfish Allergy] Anaphylaxis and Swelling    Swelling throat     Medications Prior to Admission  Medication Sig Dispense Refill   acyclovir  (ZOVIRAX ) 400 MG tablet Take 1 tablet (400 mg total) by mouth 2 (two) times daily. 60 tablet 3   allopurinol  (ZYLOPRIM ) 300 MG tablet Take 300 mg by mouth daily.     apixaban  (ELIQUIS ) 5 MG TABS tablet Take 1 tablet (5 mg total) by mouth 2 (two) times daily. 60 tablet 12   calcium -vitamin D (OSCAL WITH D) 500-5 MG-MCG tablet Take 1 tablet by mouth 2 (two) times daily. 60 tablet 3   Cholecalciferol (VITAMIN D-3) 125 MCG (5000 UT) TABS Take 5,000 Units by mouth daily.     Cyanocobalamin  (VITAMIN B12) 1000 MCG TBCR Take 1,000 mcg  by mouth daily.     dexamethasone  (DECADRON ) 4 MG tablet Take 40 mg by mouth once a week on day of Velcade  injection. (10 tablets) 40 tablet 2   diphenoxylate -atropine  (LOMOTIL ) 2.5-0.025 MG tablet Take 1 tablet by mouth 4 (four) times daily as needed for diarrhea or loose stools. 120 tablet 0   ezetimibe (ZETIA) 10 MG tablet Take 10 mg by mouth every evening.     FLUoxetine  (PROZAC ) 20 MG capsule Take 20 mg by mouth daily.     fluticasone  (FLONASE) 50 MCG/ACT nasal spray Place 1 spray into both nostrils daily.     gabapentin  (NEURONTIN ) 300 MG capsule Take 2 capsules (600 mg total) by mouth 2 (two) times daily. (Patient taking differently: Take 600 mg by mouth 3 (three) times daily.)     guaiFENesin  (MUCINEX ) 600 MG 12 hr tablet Take 600 mg by mouth daily.     guaifenesin  (ROBITUSSIN) 100 MG/5ML syrup Take 200 mg by mouth daily as needed for cough.     lenalidomide  (REVLIMID ) 10 MG capsule Take 1 capsule (10 mg total) by mouth daily. Bertrum Barrows #   [id number]   Date Obtained 11/01/23 Take one daily for 21 days then none for 7 days. 21 capsule 0   melatonin 5 MG TABS Take 5 mg by mouth at bedtime.     metoprolol  tartrate (LOPRESSOR ) 25 MG tablet Take 0.5 tablets (12.5 mg total) by mouth daily as needed (For persistent palpitation). 30 tablet 0   omeprazole (PRILOSEC) 40 MG capsule Take 40 mg by mouth daily.     ondansetron  (ZOFRAN ) 8 MG tablet Take 1 tablet (8 mg total) by mouth every 8 (eight) hours as needed for nausea or vomiting. 30 tablet 1   potassium chloride  SA (KLOR-CON  M) 20 MEQ tablet Take 1 tablet (20 mEq total) by mouth 2 (two) times daily. 60 tablet 2   pravastatin (PRAVACHOL) 80 MG tablet Take 80 mg by mouth at bedtime.     prochlorperazine  (COMPAZINE ) 10 MG tablet Take 1 tablet (10 mg total) by mouth every 6 (six) hours as needed for nausea or vomiting. 30 tablet 1   pyridoxine (B-6) 100 MG tablet Take 100 mg by mouth daily.      Blood pressure 118/70, pulse 74, temperature 97.8 F (36.6 C), temperature source Oral, resp. rate 14, height 6' 4 (1.93 m), weight 91.7 kg, SpO2 100%. Physical Exam:  General: Pleasant, male who is laying in bed in NAD. HEENT: Sclera are noninjected. Conjunctiva anicteric. EOMI. Heart: Regular, rate, and rhythm.   Lungs: Respiratory effort nonlabored Abd: Soft, NT, ND. No guarding or rebound tenderness. MS: Upper extremities without concerns. Lower extremities noted to have edema  bilaterally. No cyanosis, clubbing. 2+ DP pulses of both extremities.  Psych: A&Ox3 with an appropriate affect.   Results for orders placed or performed during the hospital encounter of 11/02/23 (from the past 48 hours)  Basic metabolic panel with GFR     Status: Abnormal   Collection Time: 11/02/23  9:38 PM  Result Value Ref Range   Sodium 135 135 - 145 mmol/L   Potassium 3.7 3.5 - 5.1 mmol/L   Chloride 103 98 - 111 mmol/L   CO2 19 (L) 22 - 32 mmol/L   Glucose, Bld 101 (H) 70 - 99 mg/dL    Comment: Glucose reference range applies only to samples taken after fasting for at least 8 hours.   BUN 13 8 - 23 mg/dL   Creatinine, Ser 9.08 0.61 -  1.24 mg/dL   Calcium  6.7 (L) 8.9 - 10.3 mg/dL   GFR, Estimated >39 >39 mL/min    Comment: (NOTE) Calculated using the CKD-EPI Creatinine Equation (2021)    Anion gap 13 5 - 15    Comment: Performed at [facility], [address], [place], [place] [address]  Magnesium      Status: None   Collection Time: 11/02/23  9:38 PM  Result Value Ref Range   Magnesium  1.8 1.7 - 2.4 mg/dL    Comment: Performed at [facility], [address], [place], [place] [address]  Phosphorus     Status: Abnormal   Collection Time: 11/02/23  9:38 PM  Result Value Ref Range   Phosphorus 2.3 (L) 2.5 - 4.6 mg/dL    Comment: Performed at [facility], [address] [place], [place] [address]  CK     Status: None   Collection Time: 11/02/23  9:38 PM  Result Value Ref Range   Total CK 188 49 - 397 U/L    Comment: Performed at [facility], [address], [place], [place] [address]  Magnesium      Status: None   Collection Time: 11/03/23  5:43 AM  Result Value Ref Range   Magnesium  1.8 1.7 - 2.4 mg/dL    Comment: Performed at [facility], [address], [place], [place] [address]  Phosphorus     Status: None   Collection Time: 11/03/23  5:43 AM  Result Value Ref Range    Phosphorus 2.6 2.5 - 4.6 mg/dL    Comment: Performed at [facility], [address], [place], [place] [address]  Comprehensive metabolic panel     Status: Abnormal   Collection Time: 11/03/23  5:43 AM  Result Value Ref Range   Sodium 135 135 - 145 mmol/L   Potassium 3.7 3.5 - 5.1 mmol/L   Chloride 104 98 - 111 mmol/L   CO2 19 (L) 22 - 32 mmol/L   Glucose, Bld 81 70 - 99 mg/dL    Comment: Glucose reference range applies only to samples taken after fasting for at least 8 hours.   BUN 12 8 - 23 mg/dL   Creatinine, Ser 9.21 0.61 - 1.24 mg/dL   Calcium  6.4 (LL) 8.9 - 10.3 mg/dL    Comment: CRITICAL RESULT CALLED TO, READ BACK BY AND VERIFIED WITH R. PEAKS,RN ON 11/03/2023 AT 0738 AM BY SL    Total Protein 4.9 (L) 6.5 - 8.1 g/dL   Albumin 2.2 (L) 3.5 - 5.0 g/dL   AST 25 15 - 41 U/L   ALT 25 0 - 44 U/L   Alkaline Phosphatase 53 38 - 126 U/L   Total Bilirubin 0.9 0.0 - 1.2 mg/dL   GFR, Estimated >39 >39 mL/min    Comment: (NOTE) Calculated using the CKD-EPI Creatinine Equation (2021)    Anion gap 12 5 - 15    Comment: Performed at [facility], [address] [place], [place] [address]  CBC     Status: Abnormal   Collection Time: 11/03/23  5:43 AM  Result Value Ref Range   WBC 8.7 4.0 - 10.5 K/uL   RBC 3.42 (L) 4.22 - 5.81 MIL/uL   Hemoglobin 10.5 (L) 13.0 - 17.0 g/dL   HCT 67.3 (L) 60.9 - 47.9 %   MCV 95.3 80.0 - 100.0 fL   MCH 30.7 26.0 - 34.0 pg   MCHC 32.2 30.0 - 36.0  g/dL   RDW 83.2 (H) 88.4 - 84.4 %   Platelets 243 150 - 400 K/uL   nRBC 0.0 0.0 - 0.2 %    Comment: Performed at [facility], [address], [place], [place] [address]   CT ABDOMEN PELVIS W CONTRAST Result Date: 11/03/2023 CLINICAL DATA:  Acute abdominal pain, history of multiple myeloma EXAM: CT ABDOMEN AND PELVIS WITH CONTRAST TECHNIQUE: Multidetector CT imaging of the abdomen and pelvis was performed using the standard protocol following bolus  administration of intravenous contrast. RADIATION DOSE REDUCTION: This exam was performed according to the departmental dose-optimization program which includes automated exposure control, adjustment of the mA and/or kV according to patient size and/or use of iterative reconstruction technique. CONTRAST:  OMNIPAQUE  IOHEXOL  300 MG/ML  SOLN COMPARISON:  10/14/2023 FINDINGS: Lower chest: Dependent ground-glass opacities are again identified stable from the prior exam. No new focal abnormality is seen. Hepatobiliary: No focal liver abnormality is seen. No gallstones, gallbladder wall thickening, or biliary dilatation. Mild perihepatic fluid is noted similar to that seen on the prior exam. Pancreas: Unremarkable. No pancreatic ductal dilatation or surrounding inflammatory changes. Spleen: Normal in size without focal abnormality. Mild perisplenic fluid is noted. Adrenals/Urinary Tract: Adrenal glands are within normal limits. Kidneys demonstrate a normal enhancement pattern bilaterally. No renal calculi or obstructive changes noted. The bladder is partially distended. Stomach/Bowel: Scattered fecal material is noted throughout the colon. No obstructive changes are noted. The appendix is not well visualized although no inflammatory changes to suggest appendicitis are seen. Stomach is within normal limits. Persistent small bowel dilatation is noted mildly improved when compared with the prior study. Mild transition point is noted deep in the pelvis best seen on image number 83 of series 2. The more distal small bowel is within normal limits. Vascular/Lymphatic: Aortic atherosclerosis. No enlarged abdominal or pelvic lymph nodes. Reproductive: Prostate is unremarkable. Other: Mild free fluid is noted within the pelvis and abdomen and likely reactive to the small-bowel obstruction. The overall appearance is stable from the prior exam. Musculoskeletal: No acute or significant osseous findings. IMPRESSION: Slight  improvement in small bowel dilatation when compared with the prior exam. Stable free fluid is noted within the abdomen. Stable ground-glass opacities in the bases bilaterally. Electronically Signed   By: Oneil Devonshire M.D.   On: 11/03/2023 01:52   DG Abd 1 View Result Date: 11/02/2023 CLINICAL DATA:  Small bowel obstruction. Nausea, vomiting, and lower quadrant abdominal pain. History of cancer. EXAM: ABDOMEN - 1 VIEW COMPARISON:  10/18/2023 FINDINGS: Moderately distended gas-filled upper abdominal small bowel with scattered gas and stool in the colon. Gaseous distention of lower abdominal small bowel is improved since prior study. No radiopaque stones. Degenerative changes in the spine and hips. Visualized lung bases are clear. IMPRESSION: Persisting gaseous distention of small bowel but demonstrating some improvement since prior study. Electronically Signed   By: Elsie Gravely M.D.   On: 11/02/2023 19:33      Assessment/Plan SBO - CT Abdomen Pelvis W Contrast on 11/03/2023 showed persistent small bowel dilatation noted mildly improved when compared with prior study. Mild transition point is noted deep in the pelvis. The more distal small bowel is within normal limits. Mild free fluid is noted within the pelvis and abdomen and likely reactive to the small-bowel obstruction.  - WBC 8.7 from 10.8 (7/31) - Hemoglobin 10.5 - Discussed history, labs, and imaging that is worrisome for a small bowel obstruction. Reviewed recommendations for diagnostic laparoscopy, possible exploratory laparotomy,  possible bowel resection. Discussed possible risks in detail. All questions were answered. Patient agrees to proceed.   FEN: NPO; 0.9% sodium chloride  at 75 mL/hr VTE: SCDs ID: [email or web address]   I reviewed specialists notes, hospitalist notes, last 24 h vitals and pain scores, last 48 h intake and output, last 24 h labs and trends, and last 24 h imaging results.  This care required high  level of medical  decision making.   Marjorie Carlyon Favre, [facility] 11/03/2023, 8:06 AM Please see Amion for pager number during day hours 7:00am-4:30pm

## 2023-11-03 NOTE — Progress Notes (Signed)
 OT Cancellation Note  Patient Details Name: David Mcgrath MRN: [id number] DOB: [birth year]   Cancelled Treatment:    Reason Eval/Treat Not Completed: Patient at procedure or test/ unavailable  OT will continue to follow and evaluate when medically ready as schedule allows.   Mayetta Castleman OT/L Acute Rehabilitation Department  [phone]'   11/03/2023, 10:32 AM

## 2023-11-03 NOTE — Consult Note (Signed)
 [facility] Consult  Referring Provider: Federico Norleen ONEIDA MADISON, MD Primary Care Physician:  [name], DO Primary Gastroenterologist: [facility] - Dr. Dianna  Reason for Consultation: Nausea, vomiting  SUBJECTIVE:   HPI: David Mcgrath is a 73 y.o. male with past medical history significant for multiple myeloma undergoing treatment through [facility]. Previously treated with lenalinomide, though further therapy being held given poor tolerance to medication (patient noted having abdominal pain, nausea, vomiting). Other medical history significant for hypertension, hyperlipidemia, paroxysmal atrial fibrillation and hepatic steatosis.   He was admitted from 10/14/23-10/20/23 for symptoms related to small bowel obstruction (transition point suspected in the right hemi-abdomen on CT imaging) and received non-operative management. He noted receiving chemotherapy on 10/26/23. He began to have diarrheal symptoms on 10/28/23. His diarrhea stopped abruptly on 10/31/23 and he began to have nausea and vomiting. Current CT imaging shows persistent small bowel dilatation (mildly improved from prior) with mild transition point noted deep in pelvis. He went to OR today, findings suspicious for intermittent volvulus of small bowel to explain his symptoms and CT imaging.   On my evaluation today, patient was resting comfortably. He denied current abdominal pain, no nausea or vomiting. No chest pain or shortness of breath.   Colonoscopy 03/21/23 (Dr. Dianna) showed diverticulosis, internal hemorrhoids.  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  Patient reports he does not have symptoms as of 02/25/14.     Diarrhea 06/04/2023   Essential hypertension 06/04/2023    Fatty liver 06/04/2023   Flu 06/04/2023   Hyperlipidemia 06/04/2023   Ileus (HCC) 06/04/2023   Neuropathy    Paroxysmal atrial fibrillation (HCC) 07/04/2023   Pneumonia 06/04/2023   Past Surgical History:  Procedure Laterality Date   IR BONE MARROW BIOPSY & ASPIRATION  07/31/2023   NO PAST SURGERIES     Prior to Admission medications   Medication Sig Start Date End Date Taking? Authorizing Provider  acyclovir  (ZOVIRAX ) 400 MG tablet Take 1 tablet (400 mg total) by mouth 2 (two) times daily. 08/08/23  Yes Federico Norleen ONEIDA MADISON, MD  allopurinol  (ZYLOPRIM ) 300 MG tablet Take 300 mg by mouth daily.   Yes [provider]  apixaban  (ELIQUIS ) 5 MG TABS tablet Take 1 tablet (5 mg total) by mouth 2 (two) times daily. 07/04/23  Yes Revankar, Jennifer SAUNDERS, MD  calcium -vitamin D (OSCAL WITH D) 500-5 MG-MCG tablet Take 1 tablet by mouth 2 (two) times daily. 09/21/23  Yes Federico Norleen ONEIDA MADISON, MD  Cholecalciferol (VITAMIN D-3) 125 MCG (5000 UT) TABS Take 5,000 Units by mouth daily.   Yes [provider]  Cyanocobalamin  (VITAMIN B12) 1000 MCG TBCR Take 1,000 mcg by mouth daily.   Yes [provider]  dexamethasone  (DECADRON ) 4 MG tablet Take 40 mg by mouth once a week on day of Velcade  injection. (10 tablets) 09/07/23  Yes Dorsey, Dodger T IV, MD  diphenoxylate -atropine  (LOMOTIL ) 2.5-0.025 MG tablet Take 1 tablet by mouth 4 (four) times daily as needed for diarrhea or loose stools. 08/23/23  Yes Thayil, Irene T, PA-C  ezetimibe (ZETIA) 10 MG tablet Take 10 mg by mouth every evening. 06/01/19  Yes [provider]  FLUoxetine  (PROZAC ) 20 MG capsule Take 20 mg by mouth daily.   Yes [provider]  fluticasone (FLONASE) 50 MCG/ACT nasal spray Place 1 spray into both nostrils daily as needed for allergies or rhinitis.   Yes [provider]  gabapentin  (NEURONTIN ) 300 MG capsule Take 2 capsules (600 mg total) by mouth 2 (two) times daily. 06/15/23  Yes Briana Elgin LABOR, MD   guaiFENesin  (MUCINEX ) 600 MG 12 hr tablet Take 600 mg by mouth 2 (two) times daily as needed for cough or to loosen phlegm.   Yes [provider]  melatonin 5 MG TABS Take 5 mg by mouth at bedtime.   Yes [provider]  metoprolol  tartrate (LOPRESSOR ) 25 MG tablet Take 0.5 tablets (12.5 mg total) by mouth daily as needed (For persistent palpitation). 10/20/23  Yes Dahal, Chapman, MD  omeprazole (PRILOSEC) 40 MG capsule Take 40 mg by mouth daily.   Yes [provider]  ondansetron  (ZOFRAN ) 8 MG tablet Take 1 tablet (8 mg total) by mouth every 8 (eight) hours as needed for nausea or vomiting. 08/08/23  Yes Federico Norleen ONEIDA MADISON, MD  potassium chloride  SA (KLOR-CON  M) 20 MEQ tablet Take 1 tablet (20 mEq total) by mouth 2 (two) times daily. 09/28/23  Yes Dorsey, Kota T IV, MD  pravastatin (PRAVACHOL) 80 MG tablet Take 80 mg by mouth at bedtime.   Yes [provider]  prochlorperazine  (COMPAZINE ) 10 MG tablet Take 1 tablet (10 mg total) by mouth every 6 (six) hours as needed for nausea or vomiting. 08/08/23  Yes Federico Norleen ONEIDA MADISON, MD  pyridoxine (B-6) 100 MG tablet Take 100 mg by mouth daily.   Yes [provider]  lenalidomide  (REVLIMID ) 10 MG capsule Take 1 capsule (10 mg total) by mouth daily. Bertrum Barrows #   [id number]   Date Obtained 11/01/23 Take one daily for 21 days then none for 7 days. Patient not taking: Reported on 11/03/2023 11/01/23   Federico Norleen ONEIDA MADISON, MD   Current Facility-Administered Medications  Medication Dose Route Frequency Provider Last Rate Last Admin   0.9 %  sodium chloride  infusion   Intravenous Continuous [name] Mitzie LABOR, MD 75 mL/hr at 11/03/23 0805 New Bag at 11/03/23 0805   acetaminophen  (TYLENOL ) tablet 650 mg  650 mg Oral Q6H PRN [name] Mitzie LABOR, MD       Or   acetaminophen  (TYLENOL ) suppository 650 mg  650 mg Rectal Q6H PRN [name] Mitzie LABOR, MD       albuterol  (PROVENTIL ) (2.5 MG/3ML) 0.083% nebulizer solution 2.5 mg  2.5 mg  Nebulization Q2H PRN [name] Mitzie A, MD       HYDROmorphone (DILAUDID) injection 0.5 mg  0.5 mg Intravenous Q2H PRN [name] Mitzie A, MD       ondansetron  (ZOFRAN ) tablet 4 mg  4 mg Oral Q6H PRN [name] Mitzie LABOR, MD       Or   ondansetron  (ZOFRAN ) injection 4 mg  4 mg Intravenous Q6H PRN [name] Mitzie A, MD   4 mg at 11/03/23 1020   traMADol (ULTRAM) tablet 50 mg  50 mg Oral Q6H PRN [name] Mitzie LABOR, MD       Allergies as of 11/02/2023 - Review Complete 11/02/2023  Allergen Reaction Noted   Shrimp [shellfish allergy] Anaphylaxis and Swelling 01/09/2020   Family History  Problem Relation Age of Onset   Diabetes Mother    Cancer Mother    Heart disease Mother    Cancer Father    Hyperlipidemia Father    Neuropathy Paternal Uncle    Migraines Neg  Hx    Social History   Socioeconomic History   Marital status: Married    Spouse name: Almarie   Number of children: 0   Years of education: Not on file   Highest education level: Not on file  Occupational History   Occupation: Event organiser  Tobacco Use   Smoking status: Never   Smokeless tobacco: Never  Substance and Sexual Activity   Alcohol  use: Yes    Comment: socialy   Drug use: No   Sexual activity: Not on file  Other Topics Concern   Not on file  Social History Narrative   Lives with spouse   Right handed   Drinks 4+ cups of caffeine daily   Retired    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[employer] investment banker Strain: Not on file  Food Insecurity: No Food Insecurity (11/02/2023)   Hunger Vital Sign    Worried About Running Out of Food in the Last Year: Never true    Ran Out of Food in the Last Year: Never true  Transportation Needs: No Transportation Needs (11/02/2023)   PRAPARE - Administrator, Civil Service (Medical): No    Lack of Transportation (Non-Medical): No  Physical Activity: Not on file  Stress: Not on file  Social Connections: Socially Integrated (11/02/2023)   Social Connection and Isolation  Panel    Frequency of Communication with Friends and Family: More than three times a week    Frequency of Social Gatherings with Friends and Family: Twice a week    Attends Religious Services: 1 to 4 times per year    Active Member of [employer] or Organizations: Yes    Attends Engineer, structural: More than 4 times per year    Marital Status: Married  Catering manager Violence: Not At Risk (11/02/2023)   Humiliation, Afraid, Rape, and Kick questionnaire    Fear of Current or Ex-Partner: No    Emotionally Abused: No    Physically Abused: No    Sexually Abused: No   Review of Systems:  Review of Systems  Respiratory:  Negative for shortness of breath.   Cardiovascular:  Negative for chest pain.  Gastrointestinal:  Positive for diarrhea. Negative for abdominal pain, nausea and vomiting.    OBJECTIVE:   Temp:  [97.5 F (36.4 C)-98.3 F (36.8 C)] 97.8 F (36.6 C) (08/01 1345) Pulse Rate:  [56-116] 56 (08/01 1345) Resp:  [12-26] 16 (08/01 1345) BP: (118-142)/(60-90) 135/90 (08/01 1345) SpO2:  [100 %] 100 % (08/01 1345) Weight:  [91.7 kg] 91.7 kg (08/01 0908) Last BM Date : 11/01/23 Physical Exam Constitutional:      General: He is not in acute distress.    Appearance: He is not ill-appearing, toxic-appearing or diaphoretic.  Cardiovascular:     Rate and Rhythm: Normal rate and regular rhythm.  Pulmonary:     Effort: No respiratory distress.     Breath sounds: Normal breath sounds.  Abdominal:     General: Bowel sounds are normal. There is no distension.     Palpations: Abdomen is soft.     Tenderness: There is no abdominal tenderness. There is no guarding.  Skin:    General: Skin is warm and dry.     Comments: Surgical incisions appear clean/dry/intact.  Neurological:     Mental Status: He is alert.     Labs: Recent Labs    11/01/23 1125 11/02/23 1443 11/03/23 0543  WBC 12.5* 10.8* 8.7  HGB 12.2* 12.4* 10.5*  HCT 35.2*  36.2* 32.6*  PLT 316 329 243    BMET Recent Labs    11/02/23 1443 11/02/23 2138 11/03/23 0543  NA 135 135 135  K 3.5 3.7 3.7  CL 101 103 104  CO2 21* 19* 19*  GLUCOSE 111* 101* 81  BUN 11 13 12   CREATININE 0.74 0.91 0.78  CALCIUM  6.7* 6.7* 6.4*   LFT Recent Labs    11/03/23 0543  PROT 4.9*  ALBUMIN 2.2*  AST 25  ALT 25  ALKPHOS 53  BILITOT 0.9   PT/INR No results for input(s): LABPROT, INR in the last 72 hours.  Diagnostic imaging: CT ABDOMEN PELVIS W CONTRAST Result Date: 11/03/2023 CLINICAL DATA:  Acute abdominal pain, history of multiple myeloma EXAM: CT ABDOMEN AND PELVIS WITH CONTRAST TECHNIQUE: Multidetector CT imaging of the abdomen and pelvis was performed using the standard protocol following bolus administration of intravenous contrast. RADIATION DOSE REDUCTION: This exam was performed according to the departmental dose-optimization program which includes automated exposure control, adjustment of the mA and/or kV according to patient size and/or use of iterative reconstruction technique. CONTRAST:  OMNIPAQUE  IOHEXOL  300 MG/ML  SOLN COMPARISON:  10/14/2023 FINDINGS: Lower chest: Dependent ground-glass opacities are again identified stable from the prior exam. No new focal abnormality is seen. Hepatobiliary: No focal liver abnormality is seen. No gallstones, gallbladder wall thickening, or biliary dilatation. Mild perihepatic fluid is noted similar to that seen on the prior exam. Pancreas: Unremarkable. No pancreatic ductal dilatation or surrounding inflammatory changes. Spleen: Normal in size without focal abnormality. Mild perisplenic fluid is noted. Adrenals/Urinary Tract: Adrenal glands are within normal limits. Kidneys demonstrate a normal enhancement pattern bilaterally. No renal calculi or obstructive changes noted. The bladder is partially distended. Stomach/Bowel: Scattered fecal material is noted throughout the colon. No obstructive changes are noted. The appendix is not well visualized  although no inflammatory changes to suggest appendicitis are seen. Stomach is within normal limits. Persistent small bowel dilatation is noted mildly improved when compared with the prior study. Mild transition point is noted deep in the pelvis best seen on image number 83 of series 2. The more distal small bowel is within normal limits. Vascular/Lymphatic: Aortic atherosclerosis. No enlarged abdominal or pelvic lymph nodes. Reproductive: Prostate is unremarkable. Other: Mild free fluid is noted within the pelvis and abdomen and likely reactive to the small-bowel obstruction. The overall appearance is stable from the prior exam. Musculoskeletal: No acute or significant osseous findings. IMPRESSION: Slight improvement in small bowel dilatation when compared with the prior exam. Stable free fluid is noted within the abdomen. Stable ground-glass opacities in the bases bilaterally. Electronically Signed   By: Oneil Devonshire M.D.   On: 11/03/2023 01:52   DG Abd 1 View Result Date: 11/02/2023 CLINICAL DATA:  Small bowel obstruction. Nausea, vomiting, and lower quadrant abdominal pain. History of cancer. EXAM: ABDOMEN - 1 VIEW COMPARISON:  10/18/2023 FINDINGS: Moderately distended gas-filled upper abdominal small bowel with scattered gas and stool in the colon. Gaseous distention of lower abdominal small bowel is improved since prior study. No radiopaque stones. Degenerative changes in the spine and hips. Visualized lung bases are clear. IMPRESSION: Persisting gaseous distention of small bowel but demonstrating some improvement since prior study. Electronically Signed   By: Elsie Gravely M.D.   On: 11/02/2023 19:33   IMPRESSION: Recurrent nausea, vomiting Small bowel obstruction, concerning for intermittent volvulus on OR report Multiple myeloma on chemotherapy History diverticulosis Paroxysmal atrial fibrillation (on Eliquis )  PLAN: -Suspect nausea and vomiting multifactorial  in etiology from recurrent small  bowel obstruction from intermittent volvulus and chemotherapy agent(s) -Symptoms appear to be improved today  -General Surgery and Oncology management -No indication for endoscopic evaluation at this moment -Can utilize empiric PPI therapy -Monitor for any signs of GI bleeding -[facility] will be available as needed   LOS: 1 day   Estefana Keas, DO [facility]

## 2023-11-03 NOTE — Anesthesia Preprocedure Evaluation (Addendum)
 Anesthesia Evaluation  Patient identified by MRN, date of birth, ID band  Reviewed: Allergy & Precautions, NPO status , Patient's Chart, lab work & pertinent test results, Unable to perform ROS - Chart review only  History of Anesthesia Complications Negative for: history of anesthetic complications  Airway Mallampati: II  TM Distance: >3 FB Neck ROM: Full    Dental no notable dental hx.    Pulmonary sleep apnea    Pulmonary exam normal breath sounds clear to auscultation       Cardiovascular hypertension, (-) Past MI Normal cardiovascular exam+ dysrhythmias Atrial Fibrillation + Valvular Problems/Murmurs MR  Rhythm:Regular Rate:Normal  Echo  1. Left ventricular ejection fraction, by estimation, is 60 to 65%. The left ventricle has normal function. The left ventricle has no regional wall motion abnormalities. Left ventricular diastolic parameters were normal.   2. Right ventricular systolic function is normal. The right ventricular size is normal.   3. The mitral valve is normal in structure. Mild mitral valve regurgitation. No evidence of mitral stenosis.   4. The aortic valve is calcified. There is moderate calcification of the aortic valve. There is moderate thickening of the aortic valve. Aortic valve regurgitation is not visualized. No aortic stenosis is present.   5. The inferior vena cava is normal in size with greater than 50% respiratory variability, suggesting right atrial pressure of 3 mmHg.     Neuro/Psych  PSYCHIATRIC DISORDERS  Depression    negative neurological ROS     GI/Hepatic Neg liver ROS,,,SBO   Endo/Other  negative endocrine ROS    Renal/GU Renal disease     Musculoskeletal negative musculoskeletal ROS (+)    Abdominal   Peds  Hematology Multiple myeloma   Anesthesia Other Findings   Reproductive/Obstetrics                              Anesthesia Physical Anesthesia  Plan  ASA: 3  Anesthesia Plan: General   Post-op Pain Management:    Induction: Intravenous and Rapid sequence  PONV Risk Score and Plan: 2 and Treatment may vary due to age or medical condition, Ondansetron  and Dexamethasone   Airway Management Planned: Oral ETT  Additional Equipment: None  Intra-op Plan:   Post-operative Plan: Extubation in OR  Informed Consent: I have reviewed the patients History and Physical, chart, labs and discussed the procedure including the risks, benefits and alternatives for the proposed anesthesia with the patient or authorized representative who has indicated his/her understanding and acceptance.     Dental advisory given  Plan Discussed with: CRNA  Anesthesia Plan Comments:          Anesthesia Quick Evaluation

## 2023-11-03 NOTE — Op Note (Signed)
 Operative Note  David Mcgrath  [id number]  [id number]  11/03/2023   Surgeon: Mitzie Freund MD FACS   Procedure performed: diagnostic laparoscopy   Preop diagnosis: bowel obstruction, enteritis Post-op diagnosis/intraop findings: Small bowel appeared intermittently dilated and injected consistent with inflammation, small amount of clear ascites, mesenteric edema and injection.  No obstruction present however he does have a very long, mobile small bowel mesentery concerning for intermittent volvulus   Specimens: no Retained items: no  EBL: Minimal cc Complications: none   Description of procedure: After obtaining informed consent the patient was taken to the operating room and placed supine on operating room table where general endotracheal anesthesia was initiated, preoperative antibiotics were administered, SCDs applied, and a formal timeout was performed.  The abdomen was prepped and draped in the usual sterile fashion.  Peritoneal access was gained with an optical entry in the left upper quadrant followed by uneventful insufflation to 15 mL of mercury.  The abdominal cavity was inspected and there is no injury from our entry.  2 additional 5 mm trocars were placed in the left hemiabdomen under direct visualization.  On gross inspection, the small bowel appears intermittently dilated with erythema of the serosa consistent with inflammation and there is edema and intermittent injection of the small bowel mesentery.  Patient was placed in Trendelenburg and rotated to the left.  The ileocecal valve was identified and the bowel was followed proximally all the way to the ligament of Treitz.  There was no obstruction present.  It did appear that the small bowel had a tendency to migrate to the left upper quadrant and given very long, mobile small bowel mesentery suspect he may have intermittently volvulized the small bowel to contribute to his symptoms and appearance on CT scan.  This was all  easily reoriented to normal anatomic orientation.  The bowel was run back proximally to distally and again confirmed no obstructing pathology in that on completion the mesentery is all flat without any volvulus.  The stomach appears mildly dilated and the visible portions of the colon appear within normal limits.  At this point the procedure was completed, the abdomen was desufflated and trocars were removed.  The incisions were closed with subcuticular 4 Monocryl and Dermabond.  The patient was then awakened, extubated and taken to PACU in stable condition.    All counts were correct at the completion of the case.

## 2023-11-03 NOTE — Anesthesia Procedure Notes (Addendum)
 Procedure Name: Intubation Date/Time: 11/03/2023 10:08 AM  Performed by: Nada Corean CROME, CRNAPre-anesthesia Checklist: Patient identified, Patient being monitored, Timeout performed, Emergency Drugs available and Suction available Patient Re-evaluated:Patient Re-evaluated prior to induction Oxygen Delivery Method: Circle system utilized Preoxygenation: Pre-oxygenation with 100% oxygen Induction Type: IV induction Ventilation: Mask ventilation without difficulty Laryngoscope Size: Mac and 4 Grade View: Grade I Tube type: Oral Tube size: 7.0 mm Number of attempts: 1 Airway Equipment and Method: Stylet Placement Confirmation: ETT inserted through vocal cords under direct vision, positive ETCO2 and breath sounds checked- equal and bilateral Secured at: 21 cm Tube secured with: Tape Dental Injury: Teeth and Oropharynx as per pre-operative assessment

## 2023-11-03 NOTE — Plan of Care (Signed)

## 2023-11-03 NOTE — Transfer of Care (Signed)
 Immediate Anesthesia Transfer of Care Note  Patient: Oron Westrup III  Procedure(s) Performed: LAPAROSCOPY, DIAGNOSTIC  Patient Location: PACU  Anesthesia Type:General  Level of Consciousness: awake, alert , oriented, and patient cooperative  Airway & Oxygen Therapy: Patient Spontanous Breathing and Patient connected to nasal cannula oxygen  Post-op Assessment: Report given to RN and Post -op Vital signs reviewed and stable  Post vital signs: Reviewed and stable  Last Vitals:  Vitals Value Taken Time  BP 137/60 11/03/23 11:12  Temp    Pulse 69   Resp 20 11/03/23 11:13  SpO2 99   Vitals shown include unfiled device data.  Last Pain:  Vitals:   11/03/23 0909  TempSrc: Oral  PainSc:       Patients Stated Pain Goal: 2 (11/02/23 1835)  Complications: No notable events documented.

## 2023-11-03 NOTE — Plan of Care (Signed)
  Problem: Clinical Measurements: Goal: Ability to maintain clinical measurements within normal limits will improve Outcome: Progressing Goal: Diagnostic test results will improve Outcome: Progressing   Problem: Pain Managment: Goal: General experience of comfort will improve and/or be controlled Outcome: Progressing

## 2023-11-03 NOTE — Anesthesia Postprocedure Evaluation (Signed)
 Anesthesia Post Note  Patient: David Mcgrath  Procedure(s) Performed: LAPAROSCOPY, DIAGNOSTIC     Patient location during evaluation: PACU Anesthesia Type: General Level of consciousness: awake and alert Pain management: pain level controlled Vital Signs Assessment: post-procedure vital signs reviewed and stable Respiratory status: spontaneous breathing, nonlabored ventilation, respiratory function stable and patient connected to nasal cannula oxygen Cardiovascular status: blood pressure returned to baseline and stable Postop Assessment: no apparent nausea or vomiting Anesthetic complications: no   No notable events documented.  Last Vitals:  Vitals:   11/03/23 1115 11/03/23 1130  BP: (!) 141/61 129/62  Pulse: 70 70  Resp: 19 16  Temp: 36.6 C   SpO2: 100% 100%    Last Pain:  Vitals:   11/03/23 1130  TempSrc:   PainSc: 0-No pain                 Thom JONELLE Peoples

## 2023-11-03 NOTE — Progress Notes (Signed)
 PROGRESS NOTE    David Mcgrath  FMW:[id number] DOB: May 23, 1950 DOA: 11/02/2023 PCP: [name], DO    Brief Narrative:   David Mcgrath is a 73 y.o. male with past medical history significant for HTN, HLD, paroxysmal atrial fibrillation, depression, neuropathy, recent diagnosis multiple myeloma on 06/2023 on Velcade  and Revlimid  who presented to as a direct admission from [facility] on 11/01/2021 for intractable nausea, vomiting with concern of recurrent small bowel obstruction.  Patient endorses profuse watery diarrhea, denies blood or black stools.  Diarrhea now stopped 2 days ago.  Denies flatus.  Vomiting 4-5 times per day.  Poor oral intake and inability to tolerate medications.  Recently admitted 2 weeks ago for small bowel obstruction, Hemoccult was positive with CT angiogram abdomen/pelvis with no evidence of GI bleeding; but noted small bowel distention with suspected transition point right hemiabdomen.  Treated conservatively with NG tube decompression and ultimately discharged home.  Given poor oral intake, persistent nausea and vomiting, his oncologist requested direct admission for further evaluation and management.   Assessment & Plan:   Intractable nausea/vomiting Concern for recurrent small bowel obstruction Patient presenting as a direct admission from the cancer center with continued nausea/vomiting, poor oral intake.  Recently admitted for similar episode, treated conservatively with NG tube decompression and ultimately discharged home.  CT abdomen/pelvis with contrast with slight improvement in small bowel dilation when compared to prior exam but with mild transition point deep in the pelvis, no pancreatic ductal dilation, no inflammatory changes, stable free fluid within the abdomen.  General surgery was consulted and patient underwent diagnostic laparoscopy by Dr. [name] on 11/03/2023 findings of intermittently dilated small bowel with erythema of the  serosa consistent with inflammation/edema, intermittent injection of the small bowel mesentery, small bowel with tendency to migrate to the left upper quadrant with long/mobile small bowel mesentery with suspicion for intermittent volvulized small bowel contributing to his symptoms and findings on imaging. -- General Surgery following, appreciate assistance -- [facility] gastroenterology consulted -- Clear liquid diet -- Supportive care, antiemetics, pain control  Multiple myeloma Follows with medical oncology outpatient, Dr. Federico.  Last chemotherapy 09/21/2023 with cycle 2, day 1 of Velcade /Revlimid  and dexamethasone .   Leukocytosis Etiology likely secondary to hemoconcentration in the setting of dehydration. -- WBC 10.8>8.7  HTN Paroxysmal atrial fibrillation Not on rate controlling/antihypertensives at baseline -- Holding home Eliquis  until further recommendations from specialist  HLD -- Holding home Zetia and pravastatin until demonstrates tolerance of oral intake  Anxiety/depression -- Holding home Prozac  until demonstrates tolerance of oral intake  Peripheral neuropathy -- Continue to hold home gabapentin  for now  Moderate protein calorie malnutrition Body mass index is 24.61 kg/m.  Albumin 2.2. -- Nutrition consult  DVT prophylaxis: SCDs Start: 11/02/23 1859    Code Status: Full Code Family Communication: Daughter present at bedside  Disposition Plan:  Level of care: Telemetry Status is: Inpatient Remains inpatient appropriate because: Needs further advancement of diet with toleration    Consultants:  General Surgery [facility] gastroenterology  Procedures:  Exploratory laparoscopy, Dr. [name] 8/1  Antimicrobials:  Perioperative ceftriaxone   Subjective: Patient seen examined at bedside, lying in bed.  Family present at bedside.  No specific complaints currently.  Tolerating clear liquids at this point.  Underwent diagnostic laparoscopy this morning by Dr. [name].   Denies headache, no chest pain, no dizziness, no visual disturbance, no palpitations, no shortness of breath, no abdominal pain, no fever/chills/night sweats, no nausea/vomiting, no focal weakness, no  fatigue, no paresthesia.  Negative meds overnight per nurse staff.  Objective: Vitals:   11/03/23 1145 11/03/23 1209 11/03/23 1345 11/03/23 1345  BP: 135/72 122/76 (!) 135/90   Pulse: 64 64 (!) 116 (!) 56  Resp: 20  16   Temp: 97.7 F (36.5 C) (!) 97.5 F (36.4 C) 97.8 F (36.6 C)   TempSrc:  Oral Oral   SpO2: 100% 100% 100% 100%  Weight:      Height:        Intake/Output Summary (Last 24 hours) at 11/03/2023 1404 Last data filed at 11/03/2023 1114 Gross per 24 hour  Intake 1429.26 ml  Output 500 ml  Net 929.26 ml   Filed Weights   11/02/23 1747 11/03/23 0908  Weight: 91.7 kg 91.7 kg    Examination:  Physical Exam: GEN: NAD, alert and oriented x 3, elderly in appearance HEENT: NCAT, PERRL, EOMI, sclera clear, MMM PULM: CTAB w/o wheezes/crackles, normal respiratory effort, on 2 L nasal cannula with SpO2 100% at rest CV: RRR w/o M/G/R GI: abd soft, NTND, diminished/hypoactive bowel sounds, surgical port sites noted C/D/I MSK: no peripheral edema, moves all extremities independently NEURO: No focal neurological deficit PSYCH: normal mood/affect Integumentary: dry/intact, no rashes or wounds    Data Reviewed: I have personally reviewed following labs and imaging studies  CBC: Recent Labs  Lab 11/01/23 1125 11/02/23 1443 11/03/23 0543  WBC 12.5* 10.8* 8.7  NEUTROABS 10.6* 9.6*  --   HGB 12.2* 12.4* 10.5*  HCT 35.2* 36.2* 32.6*  MCV 90.7 91.0 95.3  PLT 316 329 243   Basic Metabolic Panel: Recent Labs  Lab 11/01/23 1125 11/02/23 1443 11/02/23 2138 11/03/23 0543  NA 135 135 135 135  K 3.0* 3.5 3.7 3.7  CL 102 101 103 104  CO2 22 21* 19* 19*  GLUCOSE 102* 111* 101* 81  BUN 11 11 13 12   CREATININE 0.85 0.74 0.91 0.78  CALCIUM  6.6* 6.7* 6.7* 6.4*  MG 1.0* 1.7  1.8 1.8  PHOS  --   --  2.3* 2.6   GFR: Estimated Creatinine Clearance: 102.5 mL/min (by C-G formula based on SCr of 0.78 mg/dL). Liver Function Tests: Recent Labs  Lab 11/01/23 1125 11/02/23 1443 11/03/23 0543  AST 27 27 25   ALT 31 30 25   ALKPHOS 57 62 53  BILITOT 0.5 0.5 0.9  PROT 5.4* 5.8* 4.9*  ALBUMIN 2.8* 3.0* 2.2*   No results for input(s): LIPASE, AMYLASE in the last 168 hours. No results for input(s): AMMONIA in the last 168 hours. Coagulation Profile: No results for input(s): INR, PROTIME in the last 168 hours. Cardiac Enzymes: Recent Labs  Lab 11/02/23 2138  CKTOTAL 188   BNP (last 3 results) No results for input(s): PROBNP in the last 8760 hours. HbA1C: No results for input(s): HGBA1C in the last 72 hours. CBG: No results for input(s): GLUCAP in the last 168 hours. Lipid Profile: No results for input(s): CHOL, HDL, LDLCALC, TRIG, CHOLHDL, LDLDIRECT in the last 72 hours. Thyroid  Function Tests: No results for input(s): TSH, T4TOTAL, FREET4, T3FREE, THYROIDAB in the last 72 hours. Anemia Panel: No results for input(s): VITAMINB12, FOLATE, FERRITIN, TIBC, IRON, RETICCTPCT in the last 72 hours. Sepsis Labs: No results for input(s): PROCALCITON, LATICACIDVEN in the last 168 hours.  No results found for this or any previous visit (from the past 240 hours).       Radiology Studies: CT ABDOMEN PELVIS W CONTRAST Result Date: 11/03/2023 CLINICAL DATA:  Acute abdominal pain, history of  multiple myeloma EXAM: CT ABDOMEN AND PELVIS WITH CONTRAST TECHNIQUE: Multidetector CT imaging of the abdomen and pelvis was performed using the standard protocol following bolus administration of intravenous contrast. RADIATION DOSE REDUCTION: This exam was performed according to the departmental dose-optimization program which includes automated exposure control, adjustment of the mA and/or kV according to patient size and/or use  of iterative reconstruction technique. CONTRAST:  OMNIPAQUE  IOHEXOL  300 MG/ML  SOLN COMPARISON:  10/14/2023 FINDINGS: Lower chest: Dependent ground-glass opacities are again identified stable from the prior exam. No new focal abnormality is seen. Hepatobiliary: No focal liver abnormality is seen. No gallstones, gallbladder wall thickening, or biliary dilatation. Mild perihepatic fluid is noted similar to that seen on the prior exam. Pancreas: Unremarkable. No pancreatic ductal dilatation or surrounding inflammatory changes. Spleen: Normal in size without focal abnormality. Mild perisplenic fluid is noted. Adrenals/Urinary Tract: Adrenal glands are within normal limits. Kidneys demonstrate a normal enhancement pattern bilaterally. No renal calculi or obstructive changes noted. The bladder is partially distended. Stomach/Bowel: Scattered fecal material is noted throughout the colon. No obstructive changes are noted. The appendix is not well visualized although no inflammatory changes to suggest appendicitis are seen. Stomach is within normal limits. Persistent small bowel dilatation is noted mildly improved when compared with the prior study. Mild transition point is noted deep in the pelvis best seen on image number 83 of series 2. The more distal small bowel is within normal limits. Vascular/Lymphatic: Aortic atherosclerosis. No enlarged abdominal or pelvic lymph nodes. Reproductive: Prostate is unremarkable. Other: Mild free fluid is noted within the pelvis and abdomen and likely reactive to the small-bowel obstruction. The overall appearance is stable from the prior exam. Musculoskeletal: No acute or significant osseous findings. IMPRESSION: Slight improvement in small bowel dilatation when compared with the prior exam. Stable free fluid is noted within the abdomen. Stable ground-glass opacities in the bases bilaterally. Electronically Signed   By: Oneil Devonshire M.D.   On: 11/03/2023 01:52   DG Abd 1  View Result Date: 11/02/2023 CLINICAL DATA:  Small bowel obstruction. Nausea, vomiting, and lower quadrant abdominal pain. History of cancer. EXAM: ABDOMEN - 1 VIEW COMPARISON:  10/18/2023 FINDINGS: Moderately distended gas-filled upper abdominal small bowel with scattered gas and stool in the colon. Gaseous distention of lower abdominal small bowel is improved since prior study. No radiopaque stones. Degenerative changes in the spine and hips. Visualized lung bases are clear. IMPRESSION: Persisting gaseous distention of small bowel but demonstrating some improvement since prior study. Electronically Signed   By: Elsie Gravely M.D.   On: 11/02/2023 19:33        Scheduled Meds: Continuous Infusions:  sodium chloride  75 mL/hr at 11/03/23 0805     LOS: 1 day    Time spent: 53 minutes spent on 11/03/2023 caring for this patient face-to-face including chart review, ordering labs/tests, documenting, discussion with nursing staff, consultants, updating family and interview/physical exam    Camellia PARAS [place], DO [facility] Available via Epic secure chat 7am-7pm After these hours, please refer to coverage provider listed on [email or web address] 11/03/2023, 2:04 PM

## 2023-11-03 NOTE — Progress Notes (Signed)
 PT Cancellation Note  Patient Details Name: David Mcgrath MRN: [id number] DOB: 09-04-1950   Cancelled Treatment:     PT order received but eval deferred.  Pt for diagnostic laparoscopy this date.  Will follow.   [name] 11/03/2023, 12:20 PM

## 2023-11-04 ENCOUNTER — Encounter (HOSPITAL_COMMUNITY): Payer: Self-pay | Admitting: Surgery

## 2023-11-04 DIAGNOSIS — E860 Dehydration: Secondary | ICD-10-CM | POA: Diagnosis not present

## 2023-11-04 LAB — CBC
HCT: 34.7 % — ABNORMAL LOW (ref 39.0–52.0)
Hemoglobin: 11.2 g/dL — ABNORMAL LOW (ref 13.0–17.0)
MCH: 30.7 pg (ref 26.0–34.0)
MCHC: 32.3 g/dL (ref 30.0–36.0)
MCV: 95.1 fL (ref 80.0–100.0)
Platelets: 251 K/uL (ref 150–400)
RBC: 3.65 MIL/uL — ABNORMAL LOW (ref 4.22–5.81)
RDW: 16.8 % — ABNORMAL HIGH (ref 11.5–15.5)
WBC: 11.4 K/uL — ABNORMAL HIGH (ref 4.0–10.5)
nRBC: 0 % (ref 0.0–0.2)

## 2023-11-04 LAB — COMPREHENSIVE METABOLIC PANEL WITH GFR
ALT: 25 U/L (ref 0–44)
AST: 27 U/L (ref 15–41)
Albumin: 2.4 g/dL — ABNORMAL LOW (ref 3.5–5.0)
Alkaline Phosphatase: 54 U/L (ref 38–126)
Anion gap: 4 — ABNORMAL LOW (ref 5–15)
BUN: 10 mg/dL (ref 8–23)
CO2: 23 mmol/L (ref 22–32)
Calcium: 6.2 mg/dL — CL (ref 8.9–10.3)
Chloride: 103 mmol/L (ref 98–111)
Creatinine, Ser: 0.66 mg/dL (ref 0.61–1.24)
GFR, Estimated: >60 mL/min (ref >60)
Glucose, Bld: 106 mg/dL — ABNORMAL HIGH (ref 70–99)
Potassium: 3.6 mmol/L (ref 3.5–5.1)
Sodium: 130 mmol/L — ABNORMAL LOW (ref 135–145)
Total Bilirubin: 0.8 mg/dL (ref 0.0–1.2)
Total Protein: 5 g/dL — ABNORMAL LOW (ref 6.5–8.1)

## 2023-11-04 LAB — PHOSPHORUS: Phosphorus: 1.7 mg/dL — ABNORMAL LOW (ref 2.5–4.6)

## 2023-11-04 LAB — MAGNESIUM: Magnesium: 1.6 mg/dL — ABNORMAL LOW (ref 1.7–2.4)

## 2023-11-04 MED ORDER — PANTOPRAZOLE SODIUM 40 MG PO TBEC
80.0000 mg | DELAYED_RELEASE_TABLET | Freq: Every day | ORAL | Status: DC
  Administered 2023-11-04 – 2023-11-05 (×2): 80 mg via ORAL
  Filled 2023-11-04 (×2): qty 2

## 2023-11-04 MED ORDER — PANTOPRAZOLE SODIUM 40 MG PO TBEC: 40.0000 mg | DELAYED_RELEASE_TABLET | Freq: Every day | ORAL | Status: DC

## 2023-11-04 MED ORDER — ENSURE PLUS HIGH PROTEIN PO LIQD: 237.0000 mL | Freq: Two times a day (BID) | ORAL | Status: DC

## 2023-11-04 MED ORDER — GABAPENTIN 300 MG PO CAPS
600.0000 mg | ORAL_CAPSULE | Freq: Two times a day (BID) | ORAL | Status: DC
  Administered 2023-11-04 – 2023-11-05 (×3): 600 mg via ORAL
  Filled 2023-11-04 (×3): qty 2

## 2023-11-04 MED ORDER — APIXABAN 5 MG PO TABS
5.0000 mg | ORAL_TABLET | Freq: Two times a day (BID) | ORAL | Status: DC
Start: 2023-11-04 — End: 2023-11-05
  Administered 2023-11-04 – 2023-11-05 (×3): 5 mg via ORAL
  Filled 2023-11-04 (×3): qty 1

## 2023-11-04 MED ORDER — MAGNESIUM SULFATE 2 GM/50ML IV SOLN
2.0000 g | Freq: Once | INTRAVENOUS | Status: AC
  Administered 2023-11-04: 2 g via INTRAVENOUS
  Filled 2023-11-04: qty 50

## 2023-11-04 MED ORDER — MELATONIN 3 MG PO TABS
3.0000 mg | ORAL_TABLET | Freq: Every day | ORAL | Status: DC
  Administered 2023-11-04: 3 mg via ORAL
  Filled 2023-11-04: qty 1

## 2023-11-04 MED ORDER — FLUOXETINE HCL 20 MG PO CAPS
20.0000 mg | ORAL_CAPSULE | Freq: Every day | ORAL | Status: DC
  Administered 2023-11-04 – 2023-11-05 (×2): 20 mg via ORAL
  Filled 2023-11-04 (×2): qty 1

## 2023-11-04 NOTE — Progress Notes (Signed)
 PROGRESS NOTE    David Mcgrath  FMW:[id number] DOB: 04/16/1950 DOA: 11/02/2023 PCP: [name], DO    Brief Narrative:   David Mcgrath is a 73 y.o. male with past medical history significant for HTN, HLD, paroxysmal atrial fibrillation, depression, neuropathy, recent diagnosis multiple myeloma on 06/2023 on Velcade  and Revlimid  who presented to as a direct admission from [facility] on 11/01/2021 for intractable nausea, vomiting with concern of recurrent small bowel obstruction.  Patient endorses profuse watery diarrhea, denies blood or black stools.  Diarrhea now stopped 2 days ago.  Denies flatus.  Vomiting 4-5 times per day.  Poor oral intake and inability to tolerate medications.  Recently admitted 2 weeks ago for small bowel obstruction, Hemoccult was positive with CT angiogram abdomen/pelvis with no evidence of GI bleeding; but noted small bowel distention with suspected transition point right hemiabdomen.  Treated conservatively with NG tube decompression and ultimately discharged home.  Given poor oral intake, persistent nausea and vomiting, his oncologist requested direct admission for further evaluation and management.   Assessment & Plan:   Intractable nausea/vomiting Concern for recurrent small bowel obstruction Patient presenting as a direct admission from the cancer center with continued nausea/vomiting, poor oral intake.  Recently admitted for similar episode, treated conservatively with NG tube decompression and ultimately discharged home.  CT abdomen/pelvis with contrast with slight improvement in small bowel dilation when compared to prior exam but with mild transition point deep in the pelvis, no pancreatic ductal dilation, no inflammatory changes, stable free fluid within the abdomen.  General surgery was consulted and patient underwent diagnostic laparoscopy by Dr. [name] on 11/03/2023 findings of intermittently dilated small bowel with erythema of the  serosa consistent with inflammation/edema, intermittent injection of the small bowel mesentery, small bowel with tendency to migrate to the left upper quadrant with long/mobile small bowel mesentery with suspicion for intermittent volvulized small bowel contributing to his symptoms and findings on imaging. -- General Surgery following, appreciate assistance -- Advance to full liquid diet, further advancement as tolerates -- Supportive care, antiemetics, pain control  Multiple myeloma Follows with medical oncology outpatient, Dr. Federico.  Last chemotherapy 09/21/2023 with cycle 2, day 1 of Velcade /Revlimid  and dexamethasone .  Continue outpatient follow-up  Leukocytosis Etiology likely secondary to hemoconcentration in the setting of dehydration. -- WBC 10.8>8.7>11.4  HTN Paroxysmal atrial fibrillation Not on rate controlling/antihypertensives at baseline -- Resume home Eliquis   HLD -- Plan to resume Zetia and pravastatin on discharge  Anxiety/depression -- Resume home Prozac   Peripheral neuropathy -- Resume home gabapentin   GERD: -- Protonix  80 mg p.o. daily (substituted for home omeprazole)  Moderate protein calorie malnutrition Body mass index is 24.61 kg/m.  Albumin  2.2. -- Nutrition consult  DVT prophylaxis: SCDs Start: 11/02/23 1859    Code Status: Full Code Family Communication: No family present at bedside this morning  Disposition Plan:  Level of care: Telemetry Status is: Inpatient Remains inpatient appropriate because: Needs further advancement of diet with toleration    Consultants:  General Surgery [facility] gastroenterology  Procedures:  Exploratory laparoscopy, Dr. [name] 8/1  Antimicrobials:  Perioperative ceftriaxone    Subjective: Patient seen examined at bedside, lying in bed.  No family present.  Seen by general surgery and clear liquid diet advance to full liquids today.  No complaints, no abdominal pain and tolerating liquid diet.  No other  concerns or questions at this time.  Denies headache, no chest pain, no dizziness, no visual disturbance, no palpitations, no shortness of  breath, no abdominal pain, no fever/chills/night sweats, no nausea/vomiting, no focal weakness, no fatigue, no paresthesia.  No acute concerns overnight per nursing staff.  Objective: Vitals:   11/03/23 1345 11/03/23 2202 11/04/23 0538 11/04/23 1342  BP:  (!) 141/75 118/61 112/63  Pulse: (!) 56 74 75 (!) 41  Resp:  20 17 15   Temp:  98.6 F (37 C) 97.7 F (36.5 C) 98.1 F (36.7 C)  TempSrc:    Oral  SpO2: 100% 99% 96% 93%  Weight:      Height:        Intake/Output Summary (Last 24 hours) at 11/04/2023 1350 Last data filed at 11/04/2023 1100 Gross per 24 hour  Intake 1846.84 ml  Output 550 ml  Net 1296.84 ml   Filed Weights   11/02/23 1747 11/03/23 0908  Weight: 91.7 kg 91.7 kg    Examination:  Physical Exam: GEN: NAD, alert and oriented x 3, elderly in appearance HEENT: NCAT, PERRL, EOMI, sclera clear, MMM PULM: CTAB w/o wheezes/crackles, normal respiratory effort, on 2 L nasal cannula with SpO2 100% at rest CV: RRR w/o M/G/R GI: abd soft, NTND, diminished/hypoactive bowel sounds, surgical port sites noted C/D/I MSK: no peripheral edema, moves all extremities independently NEURO: No focal neurological deficit PSYCH: normal mood/affect Integumentary: dry/intact, no rashes or wounds    Data Reviewed: I have personally reviewed following labs and imaging studies  CBC: Recent Labs  Lab 11/01/23 1125 11/02/23 1443 11/03/23 0543 11/04/23 0721  WBC 12.5* 10.8* 8.7 11.4*  NEUTROABS 10.6* 9.6*  --   --   HGB 12.2* 12.4* 10.5* 11.2*  HCT 35.2* 36.2* 32.6* 34.7*  MCV 90.7 91.0 95.3 95.1  PLT 316 329 243 251   Basic Metabolic Panel: Recent Labs  Lab 11/01/23 1125 11/02/23 1443 11/02/23 2138 11/03/23 0543 11/04/23 0721  NA 135 135 135 135 130*  K 3.0* 3.5 3.7 3.7 3.6  CL 102 101 103 104 103  CO2 22 21* 19* 19* 23  GLUCOSE  102* 111* 101* 81 106*  BUN 11 11 13 12 10   CREATININE 0.85 0.74 0.91 0.78 0.66  CALCIUM  6.6* 6.7* 6.7* 6.4* 6.2*  MG 1.0* 1.7 1.8 1.8 1.6*  PHOS  --   --  2.3* 2.6 1.7*   GFR: Estimated Creatinine Clearance: 102.5 mL/min (by C-G formula based on SCr of 0.66 mg/dL). Liver Function Tests: Recent Labs  Lab 11/01/23 1125 11/02/23 1443 11/03/23 0543 11/04/23 0721  AST 27 27 25 27   ALT 31 30 25 25   ALKPHOS 57 62 53 54  BILITOT 0.5 0.5 0.9 0.8  PROT 5.4* 5.8* 4.9* 5.0*  ALBUMIN  2.8* 3.0* 2.2* 2.4*   No results for input(s): LIPASE, AMYLASE in the last 168 hours. No results for input(s): AMMONIA in the last 168 hours. Coagulation Profile: No results for input(s): INR, PROTIME in the last 168 hours. Cardiac Enzymes: Recent Labs  Lab 11/02/23 2138  CKTOTAL 188   BNP (last 3 results) No results for input(s): PROBNP in the last 8760 hours. HbA1C: No results for input(s): HGBA1C in the last 72 hours. CBG: No results for input(s): GLUCAP in the last 168 hours. Lipid Profile: No results for input(s): CHOL, HDL, LDLCALC, TRIG, CHOLHDL, LDLDIRECT in the last 72 hours. Thyroid  Function Tests: No results for input(s): TSH, T4TOTAL, FREET4, T3FREE, THYROIDAB in the last 72 hours. Anemia Panel: No results for input(s): VITAMINB12, FOLATE, FERRITIN, TIBC, IRON, RETICCTPCT in the last 72 hours. Sepsis Labs: No results for input(s): PROCALCITON, LATICACIDVEN in  the last 168 hours.  No results found for this or any previous visit (from the past 240 hours).       Radiology Studies: CT ABDOMEN PELVIS W CONTRAST Result Date: 11/03/2023 CLINICAL DATA:  Acute abdominal pain, history of multiple myeloma EXAM: CT ABDOMEN AND PELVIS WITH CONTRAST TECHNIQUE: Multidetector CT imaging of the abdomen and pelvis was performed using the standard protocol following bolus administration of intravenous contrast. RADIATION DOSE REDUCTION: This exam  was performed according to the departmental dose-optimization program which includes automated exposure control, adjustment of the mA and/or kV according to patient size and/or use of iterative reconstruction technique. CONTRAST:  OMNIPAQUE  IOHEXOL  300 MG/ML  SOLN COMPARISON:  10/14/2023 FINDINGS: Lower chest: Dependent ground-glass opacities are again identified stable from the prior exam. No new focal abnormality is seen. Hepatobiliary: No focal liver abnormality is seen. No gallstones, gallbladder wall thickening, or biliary dilatation. Mild perihepatic fluid is noted similar to that seen on the prior exam. Pancreas: Unremarkable. No pancreatic ductal dilatation or surrounding inflammatory changes. Spleen: Normal in size without focal abnormality. Mild perisplenic fluid is noted. Adrenals/Urinary Tract: Adrenal glands are within normal limits. Kidneys demonstrate a normal enhancement pattern bilaterally. No renal calculi or obstructive changes noted. The bladder is partially distended. Stomach/Bowel: Scattered fecal material is noted throughout the colon. No obstructive changes are noted. The appendix is not well visualized although no inflammatory changes to suggest appendicitis are seen. Stomach is within normal limits. Persistent small bowel dilatation is noted mildly improved when compared with the prior study. Mild transition point is noted deep in the pelvis best seen on image number 83 of series 2. The more distal small bowel is within normal limits. Vascular/Lymphatic: Aortic atherosclerosis. No enlarged abdominal or pelvic lymph nodes. Reproductive: Prostate is unremarkable. Other: Mild free fluid is noted within the pelvis and abdomen and likely reactive to the small-bowel obstruction. The overall appearance is stable from the prior exam. Musculoskeletal: No acute or significant osseous findings. IMPRESSION: Slight improvement in small bowel dilatation when compared with the prior exam. Stable  free fluid is noted within the abdomen. Stable ground-glass opacities in the bases bilaterally. Electronically Signed   By: Oneil Devonshire M.D.   On: 11/03/2023 01:52   DG Abd 1 View Result Date: 11/02/2023 CLINICAL DATA:  Small bowel obstruction. Nausea, vomiting, and lower quadrant abdominal pain. History of cancer. EXAM: ABDOMEN - 1 VIEW COMPARISON:  10/18/2023 FINDINGS: Moderately distended gas-filled upper abdominal small bowel with scattered gas and stool in the colon. Gaseous distention of lower abdominal small bowel is improved since prior study. No radiopaque stones. Degenerative changes in the spine and hips. Visualized lung bases are clear. IMPRESSION: Persisting gaseous distention of small bowel but demonstrating some improvement since prior study. Electronically Signed   By: Elsie Gravely M.D.   On: 11/02/2023 19:33        Scheduled Meds:  feeding supplement  237 mL Oral BID BM   Continuous Infusions:     LOS: 2 days    Time spent: 53 minutes spent on 11/04/2023 caring for this patient face-to-face including chart review, ordering labs/tests, documenting, discussion with nursing staff, consultants, updating family and interview/physical exam    Camellia PARAS [place], DO [facility] Available via Epic secure chat 7am-7pm After these hours, please refer to coverage provider listed on [email or web address] 11/04/2023, 1:50 PM

## 2023-11-04 NOTE — Evaluation (Signed)
 Occupational Therapy Evaluation Patient Details Name: David Mcgrath MRN: [id number] DOB: 16-Nov-1950 Today's Date: 11/04/2023   History of Present Illness   Ulrich Soules III is a 73 y.o. male with past medical history significant for HTN, HLD, paroxysmal atrial fibrillation, depression, neuropathy, recent diagnosis multiple myeloma on 06/2023 on Velcade  and Revlimid  who presented to as a direct admission from [facility] on 11/01/2021 for intractable nausea, vomiting with concern of recurrent small bowel obstruction. 8/1 underwent diagnostic laparoscopy, findings suspicious for intermittent volvulus of small bowel.    Recently admitted 2 weeks ago for small bowel obstruction, Hemoccult was positive with CT angiogram abdomen/pelvis with no evidence of GI bleeding; but noted small bowel distention with suspected transition point right hemiabdomen.  Treated conservatively with NG tube decompression and ultimately discharged home.     Clinical Impressions PTA, pt was living at home with his wife and reports he was mostly independent with ADL and modified independent with functional mobility at RW level. Pt currently requires contact guard assistance for short distance functional mobility at RW level in the room. He demonstrates decreased activity tolerance, poor endurance, cardiopulmonary limitations impacting safety and independence with ADL/functional mobility. Pt's HR spiked to 140s with short distance mobility and pt communicated need to hurry up and sit down. Educated pt on energy conservation strategies, fall risk prevention and use of DME/AE to maximize safety and independence with ADL/IADL. Will continue to follow acutely and progress appropriately to allow safe d/c home with follow-up HHOT services and assistance from his wife as needed.      If plan is discharge home, recommend the following:   A little help with walking and/or transfers;A little help with  bathing/dressing/bathroom;Assistance with cooking/housework     Functional Status Assessment   Patient has had a recent decline in their functional status and demonstrates the ability to make significant improvements in function in a reasonable and predictable amount of time.     Equipment Recommendations   BSC/3in1     Recommendations for Other Services         Precautions/Restrictions   Precautions Precautions: Fall Restrictions Weight Bearing Restrictions Per Provider Order: No     Mobility Bed Mobility Overal bed mobility: Needs Assistance             General bed mobility comments: pt sitting in recliner upon arrival, returned to recliner    Transfers Overall transfer level: Needs assistance Equipment used: Rolling walker (2 wheels) Transfers: Sit to/from Stand Sit to Stand: Contact guard assist           General transfer comment: cga for powerup from recliner      Balance Overall balance assessment: Needs assistance Sitting-balance support: Feet supported, No upper extremity supported Sitting balance-Leahy Scale: Good     Standing balance support: During functional activity, Reliant on assistive device for balance, Bilateral upper extremity supported Standing balance-Leahy Scale: Poor Standing balance comment: reliant on at least single UE support                           ADL either performed or assessed with clinical judgement   ADL Overall ADL's : Needs assistance/impaired Eating/Feeding: Independent   Grooming: Set up;Sitting Grooming Details (indicate cue type and reason): educated on sitting during grooming task for energy conservation Upper Body Bathing: Set up;Sitting   Lower Body Bathing: Minimal assistance;Sit to/from stand Lower Body Bathing Details (indicate cue type and reason): assist to  access lower part of legs Upper Body Dressing : Set up;Sitting   Lower Body Dressing: Contact guard assist;Sit to/from  stand Lower Body Dressing Details (indicate cue type and reason): pt bending over to access bilateral feet, educated on use of AE to assist with dressing and for energy conservation Toilet Transfer: Contact guard assist;Ambulation;Rolling walker (2 wheels) Toilet Transfer Details (indicate cue type and reason): pt would benefit from short distance Toileting- Clothing Manipulation and Hygiene: Contact guard assist;Sit to/from stand       Functional mobility during ADLs: Contact guard assist;Rolling walker (2 wheels) General ADL Comments: cga for safety, pt significantly limited by HR (up to 140s during short distance in room ambulation) decreased activity tolerance and poor endurance     Vision Baseline Vision/History: 1 Wears glasses Ability to See in Adequate Light: 1 Impaired Patient Visual Report: No change from baseline       Perception         Praxis         Pertinent Vitals/Pain Pain Assessment Pain Assessment: No/denies pain     Extremity/Trunk Assessment Upper Extremity Assessment Upper Extremity Assessment: Generalized weakness   Lower Extremity Assessment Lower Extremity Assessment: Defer to PT evaluation RLE Deficits / Details: Grossly 4/5, R ankle limited ROM and reduce strength 2+/5  history of peripheral neuropathy. RLE Sensation: history of peripheral neuropathy LLE Deficits / Details: Grossly 3+/5, L hip flexion 3+/5, R ankle DF/PF 3+/5 history of peripheral neuropathy. LLE Sensation: history of peripheral neuropathy   Cervical / Trunk Assessment Cervical / Trunk Assessment: Normal   Communication Communication Communication: No apparent difficulties   Cognition Arousal: Alert Behavior During Therapy: WFL for tasks assessed/performed Cognition: Cognition impaired         Attention impairment (select first level of impairment): Divided attention   OT - Cognition Comments: pt and his wife report pt with recent difficulty with understanding and  processing complex information, attribute it to medication/treatment, cognition WNL for tasks assessed                 Following commands: Intact       Cueing  General Comments   Cueing Techniques: Verbal cues  HR up to 142bpm with in room mobility   Exercises     Shoulder Instructions      Home Living Family/patient expects to be discharged to:: Private residence Living Arrangements: Spouse/significant other Available Help at Discharge: Family Type of Home: House Home Access: Stairs to enter (plan to install ramp) Entrance Stairs-Number of Steps: 2 Entrance Stairs-Rails: Left Home Layout: One level         Bathroom Toilet: Standard     Home Equipment: Pharmacist, hospital (2 wheels);Cane - single point;Wheelchair - manual;Grab bars - toilet;Grab bars - tub/shower          Prior Functioning/Environment Prior Level of Function : Independent/Modified Independent             Mobility Comments: uses RW d/t neuropathy progression, typically does balance exercises at home ADLs Comments: Mod I with ADLs, uses shower chair.    OT Problem List: Decreased activity tolerance;Impaired balance (sitting and/or standing);Cardiopulmonary status limiting activity   OT Treatment/Interventions: Self-care/ADL training;Energy conservation;DME and/or AE instruction;Patient/family education;Balance training      OT Goals(Current goals can be found in the care plan section)   Acute Rehab OT Goals Patient Stated Goal: to go home tomorrow OT Goal Formulation: With patient Time For Goal Achievement: 11/18/23 Potential to Achieve Goals: Good ADL Goals  Pt Will Transfer to Toilet: with modified independence;ambulating Additional ADL Goal #1: Pt will demonstrate independence with 3 energy conservation strategies during ADL/IADL and functional mobility.   OT Frequency:  Min 1X/week    Co-evaluation              AM-PAC OT 6 Clicks Daily Activity      Outcome Measure Help from another person eating meals?: None Help from another person taking care of personal grooming?: A Little Help from another person toileting, which includes using toliet, bedpan, or urinal?: A Little Help from another person bathing (including washing, rinsing, drying)?: A Little Help from another person to put on and taking off regular upper body clothing?: A Little Help from another person to put on and taking off regular lower body clothing?: A Little 6 Click Score: 19   End of Session Equipment Utilized During Treatment: Rolling walker (2 wheels) Nurse Communication: Mobility status  Activity Tolerance: Patient tolerated treatment well;Patient limited by fatigue Patient left: in chair;with call bell/phone within reach;with chair alarm set;with family/visitor present  OT Visit Diagnosis: Other abnormalities of gait and mobility (R26.89)                Time: 8772-8742 OT Time Calculation (min): 30 min Charges:  OT General Charges $OT Visit: 1 Visit OT Evaluation $OT Eval Moderate Complexity: 1 Mod OT Treatments $Self Care/Home Management : 8-22 mins  Verneita OTR/L Acute Rehabilitation Services Office: [phone]   Verneita ONEIDA Moose 11/04/2023, 2:44 PM

## 2023-11-04 NOTE — Progress Notes (Signed)
 Call received from [facility].  Pt has had 2 episodes of 6-7 beats runs of Vtach.  Pt is asymptomatic and denies any palpitations or pain.    Magnesium  noted to be 1.6 this AM.  Sodium 130, Calcium  6.2 and Potassium 3.6 and Phos 1.7.    Discussed with Blondie NP who is covering for hospitalist team and will replace Mag.    11/04/23 2020  Vitals  Temp 98 F (36.7 C)  Temp Source Oral  BP 117/80  MAP (mmHg) 92  BP Location Left Arm  BP Method Automatic  Patient Position (if appropriate) Lying  ECG Heart Rate 76  Resp 18  MEWS COLOR  MEWS Score Color Green  Oxygen Therapy  SpO2 100 %  O2 Device Room Air   Glade Lee BSN RN [facility] 11/04/2023, 9:31 PM

## 2023-11-04 NOTE — Progress Notes (Signed)
 1 Day Post-Op   Subjective/Chief Complaint: Patient feels better.  Minimal pain.  No nausea vomiting.   Objective: Vital signs in last 24 hours: Temp:  [97.5 F (36.4 C)-98.6 F (37 C)] 97.7 F (36.5 C) (08/02 0538) Pulse Rate:  [56-116] 75 (08/02 0538) Resp:  [12-20] 17 (08/02 0538) BP: (118-141)/(60-90) 118/61 (08/02 0538) SpO2:  [96 %-100 %] 96 % (08/02 0538) FiO2 (%):  [28 %] 28 % (08/01 1345) Weight:  [91.7 kg] 91.7 kg (08/01 0908) Last BM Date : 11/01/23  Intake/Output from previous day: 08/01 0701 - 08/02 0700 In: 2561.3 [P.O.:440; I.V.:1771.3; IV Piggyback:350] Out: 550 [Urine:550] Intake/Output this shift: No intake/output data recorded.  Abdomen: Incisions clean dry intact.  Soft nontender without distention.  Lab Results:  Recent Labs    11/02/23 1443 11/03/23 0543  WBC 10.8* 8.7  HGB 12.4* 10.5*  HCT 36.2* 32.6*  PLT 329 243   BMET Recent Labs    11/02/23 2138 11/03/23 0543  NA 135 135  K 3.7 3.7  CL 103 104  CO2 19* 19*  GLUCOSE 101* 81  BUN 13 12  CREATININE 0.91 0.78  CALCIUM  6.7* 6.4*   PT/INR No results for input(s): LABPROT, INR in the last 72 hours. ABG No results for input(s): PHART, HCO3 in the last 72 hours.  Invalid input(s): PCO2, PO2  Studies/Results: CT ABDOMEN PELVIS W CONTRAST Result Date: 11/03/2023 CLINICAL DATA:  Acute abdominal pain, history of multiple myeloma EXAM: CT ABDOMEN AND PELVIS WITH CONTRAST TECHNIQUE: Multidetector CT imaging of the abdomen and pelvis was performed using the standard protocol following bolus administration of intravenous contrast. RADIATION DOSE REDUCTION: This exam was performed according to the departmental dose-optimization program which includes automated exposure control, adjustment of the mA and/or kV according to patient size and/or use of iterative reconstruction technique. CONTRAST:  OMNIPAQUE  IOHEXOL  300 MG/ML  SOLN COMPARISON:  10/14/2023 FINDINGS: Lower chest:  Dependent ground-glass opacities are again identified stable from the prior exam. No new focal abnormality is seen. Hepatobiliary: No focal liver abnormality is seen. No gallstones, gallbladder wall thickening, or biliary dilatation. Mild perihepatic fluid is noted similar to that seen on the prior exam. Pancreas: Unremarkable. No pancreatic ductal dilatation or surrounding inflammatory changes. Spleen: Normal in size without focal abnormality. Mild perisplenic fluid is noted. Adrenals/Urinary Tract: Adrenal glands are within normal limits. Kidneys demonstrate a normal enhancement pattern bilaterally. No renal calculi or obstructive changes noted. The bladder is partially distended. Stomach/Bowel: Scattered fecal material is noted throughout the colon. No obstructive changes are noted. The appendix is not well visualized although no inflammatory changes to suggest appendicitis are seen. Stomach is within normal limits. Persistent small bowel dilatation is noted mildly improved when compared with the prior study. Mild transition point is noted deep in the pelvis best seen on image number 83 of series 2. The more distal small bowel is within normal limits. Vascular/Lymphatic: Aortic atherosclerosis. No enlarged abdominal or pelvic lymph nodes. Reproductive: Prostate is unremarkable. Other: Mild free fluid is noted within the pelvis and abdomen and likely reactive to the small-bowel obstruction. The overall appearance is stable from the prior exam. Musculoskeletal: No acute or significant osseous findings. IMPRESSION: Slight improvement in small bowel dilatation when compared with the prior exam. Stable free fluid is noted within the abdomen. Stable ground-glass opacities in the bases bilaterally. Electronically Signed   By: Oneil Devonshire M.D.   On: 11/03/2023 01:52   DG Abd 1 View Result Date: 11/02/2023 CLINICAL DATA:  Small bowel obstruction. Nausea, vomiting, and lower quadrant abdominal pain. History of cancer.  EXAM: ABDOMEN - 1 VIEW COMPARISON:  10/18/2023 FINDINGS: Moderately distended gas-filled upper abdominal small bowel with scattered gas and stool in the colon. Gaseous distention of lower abdominal small bowel is improved since prior study. No radiopaque stones. Degenerative changes in the spine and hips. Visualized lung bases are clear. IMPRESSION: Persisting gaseous distention of small bowel but demonstrating some improvement since prior study. Electronically Signed   By: Elsie Gravely M.D.   On: 11/02/2023 19:33    Anti-infectives: Anti-infectives (From admission, onward)    Start     Dose/Rate Route Frequency Ordered Stop   11/03/23 0930  cefTRIAXone  (ROCEPHIN ) 2 g in sodium chloride  0.9 % 100 mL IVPB        2 g 200 mL/hr over 30 Minutes Intravenous On call to O.R. 11/03/23 9166 11/03/23 1152       Assessment/Plan: s/p Procedure(s) with comments: LAPAROSCOPY, DIAGNOSTIC (N/A) - POSSIBLE EX LAP Advance diet.  LOS: 2 days  Operative report reviewed with the patient and questions answered.  Debby DELENA Shipper MD. 11/04/2023

## 2023-11-04 NOTE — Evaluation (Addendum)
 Physical Therapy Evaluation Patient Details Name: David Mcgrath MRN: [id number] DOB: 07-May-1950 Today's Date: 11/04/2023  History of Present Illness  David Mcgrath is a 73 y.o. male with past medical history significant for HTN, HLD, paroxysmal atrial fibrillation, depression, neuropathy, recent diagnosis multiple myeloma on 06/2023 on Velcade  and Revlimid  who presented to as a direct admission from [facility] on 11/01/2021 for intractable nausea, vomiting with concern of recurrent small bowel obstruction. 8/1 underwent diagnostic laparoscopy, findings suspicious for intermittent volvulus of small bowel.    Recently admitted 2 weeks ago for small bowel obstruction, Hemoccult was positive with CT angiogram abdomen/pelvis with no evidence of GI bleeding; but noted small bowel distention with suspected transition point right hemiabdomen.  Treated conservatively with NG tube decompression and ultimately discharged home.  Clinical Impression  Patient evaluated by Physical Therapy with no further acute PT needs identified. All education has been completed and the patient has no further questions. Pt demonstrated bed mobility with HOB with CGA up to modA for trunk elevation, pt required encouragement to participate more fully secondary to fatigue and recovery s/p procedure above. Pt denies pain. Pt performed STS to RW and SPT bed<>BSC CGA, no physical assist required. Pt ambulated short distance in room~67ft with RW and CGA, trunk flexed, shortened stride length, seated recovery period provided. Pt presents with generalized weakness of BLE. Pt currently being seen for HHPT, recommend resumption of same. All DME needs met. Added to mobility specialist caseload and recommended pt ambulate 3x/daily to tolerance and change position at least every 2 hours. Education provided regarding pacing of activities and respecting activity tolerance, all questions answered and education completed. In recliner  with all needs met at end of session. See below for any follow-up Physical Therapy or equipment needs. PT is signing off. Thank you for this referral.    If plan is discharge home, recommend the following: Assistance with cooking/housework;Help with stairs or ramp for entrance;A little help with walking and/or transfers   Can travel by private vehicle        Equipment Recommendations None recommended by PT (Pt has recommended DME)  Recommendations for Other Services       Functional Status Assessment Patient has had a recent decline in their functional status and demonstrates the ability to make significant improvements in function in a reasonable and predictable amount of time.     Precautions / Restrictions Precautions Precautions: Fall Restrictions Weight Bearing Restrictions Per Provider Order: No      Mobility  Bed Mobility Overal bed mobility: Needs Assistance Bed Mobility: Supine to Sit     Supine to sit: Contact guard, HOB elevated, Used rails     General bed mobility comments: increased time and effort    Transfers Overall transfer level: Needs assistance Equipment used: Rolling walker (2 wheels) Transfers: Sit to/from Stand Sit to Stand: Contact guard assist           General transfer comment: Pt CGA for STS from EOB and BSC.    Ambulation/Gait Ambulation/Gait assistance: Contact guard assist Gait Distance (Feet): 8 Feet Assistive device: Rolling walker (2 wheels) Gait Pattern/deviations: Trunk flexed, Step-through pattern, Decreased stride length, Decreased dorsiflexion - right, Decreased dorsiflexion - left, Shuffle Gait velocity: decreased     General Gait Details: cues for proper distance from RW, posture,  limited foot clearance observed, heavy reliance on B UE support at RW due to fatigue;  Stairs  Wheelchair Mobility     Tilt Bed    Modified Rankin (Stroke Patients Only)       Balance Overall balance assessment:  Needs assistance Sitting-balance support: Feet supported, No upper extremity supported Sitting balance-Leahy Scale: Good     Standing balance support: During functional activity, Reliant on assistive device for balance, Bilateral upper extremity supported Standing balance-Leahy Scale: Poor                               Pertinent Vitals/Pain Pain Assessment Pain Assessment: No/denies pain    Home Living Family/patient expects to be discharged to:: Private residence Living Arrangements: Spouse/significant other Available Help at Discharge: Family Type of Home: House Home Access: Stairs to enter (plan to install ramp) Entrance Stairs-Rails: Left Entrance Stairs-Number of Steps: 2   Home Layout: One level Home Equipment: Pharmacist, hospital (2 wheels);Cane - single point;Wheelchair - manual;Grab bars - toilet;Grab bars - tub/shower      Prior Function Prior Level of Function : Independent/Modified Independent             Mobility Comments: uses RW d/t neuropathy progression, typically does balance exercises at home ADLs Comments: Mod I with ADLs, uses shower chair.     Extremity/Trunk Assessment   Upper Extremity Assessment Upper Extremity Assessment: Defer to OT evaluation    Lower Extremity Assessment Lower Extremity Assessment: Generalized weakness;RLE deficits/detail;LLE deficits/detail RLE Deficits / Details: Grossly 4/5, R ankle limited ROM and reduce strength 2+/5  history of peripheral neuropathy. RLE Sensation: history of peripheral neuropathy LLE Deficits / Details: Grossly 3+/5, L hip flexion 3+/5, R ankle DF/PF 3+/5 history of peripheral neuropathy. LLE Sensation: history of peripheral neuropathy       Communication   Communication Communication: No apparent difficulties    Cognition Arousal: Alert Behavior During Therapy: WFL for tasks assessed/performed   PT - Cognitive impairments: No apparent impairments                          Following commands: Intact       Cueing Cueing Techniques: Verbal cues     General Comments      Exercises     Assessment/Plan    PT Assessment Patient needs continued PT services;All further PT needs can be met in the next venue of care  PT Problem List Decreased activity tolerance;Decreased mobility       PT Treatment Interventions DME instruction;Therapeutic exercise;Gait training;Functional mobility training;Therapeutic activities;Patient/family education;Balance training    PT Goals (Current goals can be found in the Care Plan section)  Acute Rehab PT Goals Patient Stated Goal: Go home PT Goal Formulation: With patient Time For Goal Achievement: 11/18/23 Potential to Achieve Goals: Good    Frequency Min 1X/week     Co-evaluation               AM-PAC PT 6 Clicks Mobility  Outcome Measure Help needed turning from your back to your side while in a flat bed without using bedrails?: A Little Help needed moving from lying on your back to sitting on the side of a flat bed without using bedrails?: A Little Help needed moving to and from a bed to a chair (including a wheelchair)?: A Little Help needed standing up from a chair using your arms (e.g., wheelchair or bedside chair)?: A Little Help needed to walk in hospital room?: A Little Help needed climbing 3-5 steps with  a railing? : A Lot 6 Click Score: 17    End of Session Equipment Utilized During Treatment: Gait belt Activity Tolerance: Patient limited by fatigue Patient left: in chair;with call bell/phone within reach;with family/visitor present;with chair alarm set Nurse Communication: Mobility status PT Visit Diagnosis: Other abnormalities of gait and mobility (R26.89);Unsteadiness on feet (R26.81)    Time: 8984-8946 PT Time Calculation (min) (ACUTE ONLY): 38 min   Charges:   PT Evaluation $PT Eval Low Complexity: 1 Low PT Treatments $Therapeutic Activity: 23-37 mins PT General  Charges $$ ACUTE PT VISIT: 1 Visit         Elsie Grieves, PT, DPT [facility] Rehabilitation Department Office: [phone]  Elsie Grieves 11/04/2023, 11:30 AM

## 2023-11-05 DIAGNOSIS — E860 Dehydration: Secondary | ICD-10-CM | POA: Diagnosis not present

## 2023-11-05 LAB — BASIC METABOLIC PANEL WITH GFR
Anion gap: 6 (ref 5–15)
BUN: 8 mg/dL (ref 8–23)
CO2: 28 mmol/L (ref 22–32)
Calcium: 6.7 mg/dL — ABNORMAL LOW (ref 8.9–10.3)
Chloride: 101 mmol/L (ref 98–111)
Creatinine, Ser: 0.59 mg/dL — ABNORMAL LOW (ref 0.61–1.24)
GFR, Estimated: >60 mL/min (ref >60)
Glucose, Bld: 84 mg/dL (ref 70–99)
Potassium: 3.1 mmol/L — ABNORMAL LOW (ref 3.5–5.1)
Sodium: 135 mmol/L (ref 135–145)

## 2023-11-05 LAB — CBC
HCT: 32.7 % — ABNORMAL LOW (ref 39.0–52.0)
Hemoglobin: 10.7 g/dL — ABNORMAL LOW (ref 13.0–17.0)
MCH: 31.4 pg (ref 26.0–34.0)
MCHC: 32.7 g/dL (ref 30.0–36.0)
MCV: 95.9 fL (ref 80.0–100.0)
Platelets: 233 K/uL (ref 150–400)
RBC: 3.41 MIL/uL — ABNORMAL LOW (ref 4.22–5.81)
RDW: 16.9 % — ABNORMAL HIGH (ref 11.5–15.5)
WBC: 7.5 K/uL (ref 4.0–10.5)
nRBC: 0 % (ref 0.0–0.2)

## 2023-11-05 LAB — MAGNESIUM: Magnesium: 2 mg/dL (ref 1.7–2.4)

## 2023-11-05 MED ORDER — POTASSIUM CHLORIDE CRYS ER 20 MEQ PO TBCR
40.0000 meq | EXTENDED_RELEASE_TABLET | ORAL | Status: AC
  Administered 2023-11-05 (×2): 40 meq via ORAL
  Filled 2023-11-05 (×2): qty 2

## 2023-11-05 NOTE — Discharge Summary (Signed)
 Physician Discharge Summary  David Mcgrath FMW:[id number] DOB: 09/30/1950 DOA: 11/02/2023  PCP: [name], DO  Admit date: 11/02/2023 Discharge date: 11/05/2023  Admitted From: Home Disposition: Home  Recommendations for Outpatient Follow-up:  Follow up with PCP in 1-2 weeks Follow-up with medical oncology, Dr. Federico as scheduled  Home Health: No Equipment/Devices: None  Discharge Condition: Stable CODE STATUS: Full code Diet recommendation: Heart healthy diet  History of present illness:  David Mcgrath is a 73 y.o. male with past medical history significant for HTN, HLD, paroxysmal atrial fibrillation, depression, neuropathy, recent diagnosis multiple myeloma on 06/2023 on Velcade  and Revlimid  who presented to as a direct admission from [facility] on 11/01/2021 for intractable nausea, vomiting with concern of recurrent small bowel obstruction.  Patient endorses profuse watery diarrhea, denies blood or black stools.  Diarrhea now stopped 2 days ago.  Denies flatus.  Vomiting 4-5 times per day.  Poor oral intake and inability to tolerate medications.  Recently admitted 2 weeks ago for small bowel obstruction, Hemoccult was positive with CT angiogram abdomen/pelvis with no evidence of GI bleeding; but noted small bowel distention with suspected transition point right hemiabdomen.  Treated conservatively with NG tube decompression and ultimately discharged home.  Given poor oral intake, persistent nausea and vomiting, his oncologist requested direct admission for further evaluation and management.    Hospital course:  Intractable nausea/vomiting Concern for recurrent small bowel obstruction Patient presenting as a direct admission from the cancer center with continued nausea/vomiting, poor oral intake.  Recently admitted for similar episode, treated conservatively with NG tube decompression and ultimately discharged home.  CT abdomen/pelvis with contrast with  slight improvement in small bowel dilation when compared to prior exam but with mild transition point deep in the pelvis, no pancreatic ductal dilation, no inflammatory changes, stable free fluid within the abdomen.  General surgery was consulted and patient underwent diagnostic laparoscopy by Dr. [name] on 11/03/2023 findings of intermittently dilated small bowel with erythema of the serosa consistent with inflammation/edema, intermittent injection of the small bowel mesentery, small bowel with tendency to migrate to the left upper quadrant with long/mobile small bowel mesentery with suspicion/potential for intermittent volvulized small bowel contributing to his symptoms and findings on imaging.  Diet was slowly advanced with toleration.  No further needs per general surgery and discharging home.   Multiple myeloma Follows with medical oncology outpatient, Dr. Federico.  Last chemotherapy 09/21/2023 with cycle 2, day 1 of Velcade  and dexamethasone .  Reports no longer on Revlimid . Continue outpatient follow-up   Leukocytosis Etiology likely secondary to hemoconcentration in the setting of dehydration.  Supported with IV fluid hydration, WBC count 7.5 at time of discharge.   HTN Paroxysmal atrial fibrillation Not on rate controlling/antihypertensives at baseline.  Continue home Eliquis    HLD Continue home Zetia  and pravastatin     Anxiety/depression Continue Prozac    Peripheral neuropathy Continue gabapentin    GERD: Continue omeprazole   Moderate protein calorie malnutrition Body mass index is 24.61 kg/m.  Albumin  2.2.  Continue to encourage increased oral intake/supplementation.  Discharge Diagnoses:  Principal Problem:   Dehydration Active Problems:   SBO (small bowel obstruction) (HCC)   Paroxysmal atrial fibrillation (HCC)   Multiple myeloma (HCC)   Neuropathy   Essential hypertension   Hyperlipidemia   OSA (obstructive sleep apnea)   Nausea & vomiting    Discharge  Instructions  Discharge Instructions     Call MD for:  difficulty breathing, headache or visual disturbances  Complete by: As directed    Call MD for:  extreme fatigue   Complete by: As directed    Call MD for:  persistant dizziness or light-headedness   Complete by: As directed    Call MD for:  persistant nausea and vomiting   Complete by: As directed    Call MD for:  severe uncontrolled pain   Complete by: As directed    Call MD for:  temperature >100.4   Complete by: As directed    Diet - low sodium heart healthy   Complete by: As directed    Increase activity slowly   Complete by: As directed    No wound care   Complete by: As directed       Allergies as of 11/05/2023       Reactions   Shrimp [shellfish Allergy] Anaphylaxis, Swelling   Swelling throat    Reglan  [metoclopramide ] Other (See Comments)   caused tardive dyskinesia        Medication List     STOP taking these medications    lenalidomide  10 MG capsule Commonly known as: REVLIMID        TAKE these medications    acyclovir  400 MG tablet Commonly known as: ZOVIRAX  Take 1 tablet (400 mg total) by mouth 2 (two) times daily.   allopurinol  300 MG tablet Commonly known as: ZYLOPRIM  Take 300 mg by mouth daily.   apixaban  5 MG Tabs tablet Commonly known as: ELIQUIS  Take 1 tablet (5 mg total) by mouth 2 (two) times daily.   calcium -vitamin D 500-5 MG-MCG tablet Commonly known as: OSCAL WITH D Take 1 tablet by mouth 2 (two) times daily.   dexamethasone  4 MG tablet Commonly known as: DECADRON  Take 40 mg by mouth once a week on day of Velcade  injection. (10 tablets)   diphenoxylate -atropine  2.5-0.025 MG tablet Commonly known as: LOMOTIL  Take 1 tablet by mouth 4 (four) times daily as needed for diarrhea or loose stools.   ezetimibe 10 MG tablet Commonly known as: ZETIA Take 10 mg by mouth every evening.   FLUoxetine  20 MG capsule Commonly known as: PROZAC  Take 20 mg by mouth daily.    fluticasone 50 MCG/ACT nasal spray Commonly known as: FLONASE Place 1 spray into both nostrils daily as needed for allergies or rhinitis.   gabapentin  300 MG capsule Commonly known as: NEURONTIN  Take 2 capsules (600 mg total) by mouth 2 (two) times daily.   guaiFENesin  600 MG 12 hr tablet Commonly known as: MUCINEX  Take 600 mg by mouth 2 (two) times daily as needed for cough or to loosen phlegm.   melatonin 5 MG Tabs Take 5 mg by mouth at bedtime.   metoprolol  tartrate 25 MG tablet Commonly known as: LOPRESSOR  Take 0.5 tablets (12.5 mg total) by mouth daily as needed (For persistent palpitation).   omeprazole 40 MG capsule Commonly known as: PRILOSEC Take 40 mg by mouth daily.   ondansetron  8 MG tablet Commonly known as: Zofran  Take 1 tablet (8 mg total) by mouth every 8 (eight) hours as needed for nausea or vomiting.   potassium chloride  SA 20 MEQ tablet Commonly known as: KLOR-CON  M Take 1 tablet (20 mEq total) by mouth 2 (two) times daily.   pravastatin 80 MG tablet Commonly known as: PRAVACHOL Take 80 mg by mouth at bedtime.   prochlorperazine  10 MG tablet Commonly known as: COMPAZINE  Take 1 tablet (10 mg total) by mouth every 6 (six) hours as needed for nausea or vomiting.   pyridoxine  100 MG tablet Commonly known as: B-6 Take 100 mg by mouth daily.   Vitamin B12 1000 MCG Tbcr Take 1,000 mcg by mouth daily.   Vitamin D -3 125 MCG (5000 UT) Tabs Take 5,000 Units by mouth daily.        Follow-up Information     [name], DO. Schedule an appointment as soon as possible for a visit in 1 week(s).   Specialty: Internal Medicine Contact information: [address] [place] [address] [phone]         Federico Norleen ONEIDA MADISON, MD Follow up.   Specialty: Hematology and Oncology Contact information: [address] [place] [address] [phone]                Allergies  Allergen Reactions   Shrimp [Shellfish Allergy]  Anaphylaxis and Swelling    Swelling throat    Reglan  [Metoclopramide ] Other (See Comments)    caused tardive dyskinesia    Consultations: General surgery   Procedures/Studies: CT ABDOMEN PELVIS W CONTRAST Result Date: 11/03/2023 CLINICAL DATA:  Acute abdominal pain, history of multiple myeloma EXAM: CT ABDOMEN AND PELVIS WITH CONTRAST TECHNIQUE: Multidetector CT imaging of the abdomen and pelvis was performed using the standard protocol following bolus administration of intravenous contrast. RADIATION DOSE REDUCTION: This exam was performed according to the departmental dose-optimization program which includes automated exposure control, adjustment of the mA and/or kV according to patient size and/or use of iterative reconstruction technique. CONTRAST:  OMNIPAQUE  IOHEXOL  300 MG/ML  SOLN COMPARISON:  10/14/2023 FINDINGS: Lower chest: Dependent ground-glass opacities are again identified stable from the prior exam. No new focal abnormality is seen. Hepatobiliary: No focal liver abnormality is seen. No gallstones, gallbladder wall thickening, or biliary dilatation. Mild perihepatic fluid is noted similar to that seen on the prior exam. Pancreas: Unremarkable. No pancreatic ductal dilatation or surrounding inflammatory changes. Spleen: Normal in size without focal abnormality. Mild perisplenic fluid is noted. Adrenals/Urinary Tract: Adrenal glands are within normal limits. Kidneys demonstrate a normal enhancement pattern bilaterally. No renal calculi or obstructive changes noted. The bladder is partially distended. Stomach/Bowel: Scattered fecal material is noted throughout the colon. No obstructive changes are noted. The appendix is not well visualized although no inflammatory changes to suggest appendicitis are seen. Stomach is within normal limits. Persistent small bowel dilatation is noted mildly improved when compared with the prior study. Mild transition point is noted deep in the pelvis best  seen on image number 83 of series 2. The more distal small bowel is within normal limits. Vascular/Lymphatic: Aortic atherosclerosis. No enlarged abdominal or pelvic lymph nodes. Reproductive: Prostate is unremarkable. Other: Mild free fluid is noted within the pelvis and abdomen and likely reactive to the small-bowel obstruction. The overall appearance is stable from the prior exam. Musculoskeletal: No acute or significant osseous findings. IMPRESSION: Slight improvement in small bowel dilatation when compared with the prior exam. Stable free fluid is noted within the abdomen. Stable ground-glass opacities in the bases bilaterally. Electronically Signed   By: Oneil Devonshire M.D.   On: 11/03/2023 01:52   DG Abd 1 View Result Date: 11/02/2023 CLINICAL DATA:  Small bowel obstruction. Nausea, vomiting, and lower quadrant abdominal pain. History of cancer. EXAM: ABDOMEN - 1 VIEW COMPARISON:  10/18/2023 FINDINGS: Moderately distended gas-filled upper abdominal small bowel with scattered gas and stool in the colon. Gaseous distention of lower abdominal small bowel is improved since prior study. No radiopaque stones. Degenerative changes in the spine and hips.  Visualized lung bases are clear. IMPRESSION: Persisting gaseous distention of small bowel but demonstrating some improvement since prior study. Electronically Signed   By: Elsie Gravely M.D.   On: 11/02/2023 19:33   DG Abd Portable 1V Result Date: 10/18/2023 CLINICAL DATA:  73 year old male with suspected small-bowel obstruction on recent CTA. EXAM: PORTABLE ABDOMEN - 1 VIEW COMPARISON:  Abdominal radiographs yesterday and earlier. FINDINGS: Portable AP supine view at 0752 hours. Enteric tube now is visible in the left upper quadrant, terminating in the stomach. Ongoing gas distended bowel loops in the abdomen and pelvis, most of which appears to be small bowel. Mid abdominal small bowel loops remain dilated approximately 5.5 cm diameter. Bowel-gas pattern has  not significantly improved compared to the CTA on 10/14/2023. No pneumoperitoneum is evident on these supine views. Stable visualized osseous structures. IMPRESSION: 1. Enteric tube terminates in the stomach. 2. No improvement in gas distended mostly small bowel loops in the abdomen and pelvis. Pattern currently favors small-bowel obstruction over ileus. Electronically Signed   By: VEAR Hurst M.D.   On: 10/18/2023 08:57   DG Abd Portable 1V Result Date: 10/17/2023 CLINICAL DATA:  73 year old male with suspected small-bowel obstruction on CTA abdomen and pelvis 3 days ago. EXAM: PORTABLE ABDOMEN - 1 VIEW COMPARISON:  10/16/2023 radiographs and earlier. FINDINGS: Portable AP supine view at 0618 hours. Retained stool in the rectum has increased since the prior CT. Bowel-gas pattern otherwise not significantly changed since that time. Ileus versus small-bowel obstruction. No pneumoperitoneum identified on these supine views. Stable visualized osseous structures. IMPRESSION: Stable gas pattern suggesting ileus versus small-bowel obstruction. Increased retained stool in the rectum from the recent CT. Electronically Signed   By: VEAR Hurst M.D.   On: 10/17/2023 10:10   DG Abd Portable 1V Result Date: 10/16/2023 CLINICAL DATA:  Small-bowel obstruction EXAM: PORTABLE ABDOMEN - 1 VIEW COMPARISON:  Abdominal radiograph dated 10/15/2023 FINDINGS: Partially imaged enteric tube tip projects over the left upper quadrant, likely within the stomach. Similar diffuse gas-filled bowel dilation throughout the abdomen. Gas is seen at the level of the rectum. IMPRESSION: Similar diffuse gas-filled bowel dilation throughout the abdomen, which may represent small-bowel obstruction or ileus. Electronically Signed   By: Limin  Xu M.D.   On: 10/16/2023 10:55   DG Abd Portable 1V-Small Bowel Obstruction Protocol-initial, 8 hr delay Result Date: 10/15/2023 CLINICAL DATA:  Small-bowel obstruction-8 hour delay EXAM: PORTABLE ABDOMEN - 1 VIEW  COMPARISON:  CT abdomen pelvis 10/14/2023 FINDINGS: Persistent dilation of the small bowel. Contrast is not definitively visualized the on the stomach. Contrast in the bladder from prior CT. IMPRESSION: Persistent small bowel obstruction. Electronically Signed   By: Norman Gatlin M.D.   On: 10/15/2023 18:39   CT Angio Abd/Pel W and/or Wo Contrast Result Date: 10/14/2023 CLINICAL DATA:  Lower GI bleed. Coffee ground emesis. History of multiple myeloma. EXAM: CTA ABDOMEN AND PELVIS WITHOUT AND WITH CONTRAST TECHNIQUE: Multidetector CT imaging of the abdomen and pelvis was performed using the standard protocol during bolus administration of intravenous contrast. Multiplanar reconstructed images and MIPs were obtained and reviewed to evaluate the vascular anatomy. RADIATION DOSE REDUCTION: This exam was performed according to the departmental dose-optimization program which includes automated exposure control, adjustment of the mA and/or kV according to patient size and/or use of iterative reconstruction technique. CONTRAST:  OMNIPAQUE  IOHEXOL  350 MG/ML SOLN COMPARISON:  CT 06/08/2023 FINDINGS: VASCULAR Aorta: Normal caliber aorta without aneurysm, dissection, vasculitis or significant stenosis. Moderate aortic atherosclerosis. Celiac: Dense  plaque at the origin causes high-grade stenosis with mild poststenotic dilatation. No dissection or aneurysm. SMA: Patent without evidence of aneurysm, dissection, vasculitis or significant stenosis. Renals: Mild plaque at the origin of both renal arteries without significant stenosis. Both renal arteries are patent without evidence of aneurysm, dissection, vasculitis, or fibromuscular dysplasia. IMA: Patent without evidence of aneurysm, dissection, vasculitis or significant stenosis. Inflow: Coarse plaque at the origin of the common iliac arteries causes less than 50% stenosis. Distal branches are patent. Proximal Outflow: Plaque in the left femoral artery without severe  stenosis. No dissection or acute findings. Veins: Venous phase imaging demonstrates no acute findings. Patent portal and splenic veins. Patent mesenteric veins. No portal venous or mesenteric gas. Review of the MIP images confirms the above findings. NON-VASCULAR Lower chest: Minimal left pleural thickening is unchanged from prior exam. Dependent ground-glass opacities may represent hypoventilatory change or infection. Hepatobiliary: No focal liver abnormality is seen. No gallstones, gallbladder wall thickening, or biliary dilatation. Pancreas: No ductal dilatation or inflammation. Spleen: Normal in size without focal abnormality. Adrenals/Urinary Tract: Normal adrenal glands. No hydronephrosis or renal calculi. No suspicious renal abnormality. The urinary bladder is partially distended, mildly thick walled. Stomach/Bowel: No contrast accumulation in the GI tract to localize site of GI bleed. Progressive small bowel distension with suspected transition point in the right hemiabdomen, series 8, image 97. There is adjacent mesenteric edema. Small bowel distal to this is decompressed. Small bowel proximally is dilated and fluid-filled. The stomach is dilated and fluid-filled. Fluid distention of the included distal esophagus. The transverse colon is tortuous, courses posterior to the stomach. Question of underlying mesenteric defect in internal hernia, but no wall thickening or obstruction at this point. Mild left colonic diverticulosis without diverticulitis. The appendix is not confidently visualized. Lymphatic: No enlarged lymph nodes in the abdomen or pelvis. Reproductive: Prostate is unremarkable. Other: Small volume abdominopelvic ascites, new from prior exam. Generalized body wall edema. There is fluid within both inguinal canals. No free air or focal fluid collection. Small left lumbar hernia contains only fat. Musculoskeletal: Scattered lytic lesions are again seen, not significantly changed. No evidence of  pathologic fracture or compression deformity. Multilevel degenerative change in the spine. IMPRESSION: 1. No contrast accumulation in the GI tract to localize site of GI bleed. 2. Progressive small bowel distension with suspected transition point in the right hemiabdomen, suspicious for small bowel obstruction. 3. Question of internal hernia with transverse colon coursing posterior to the stomach, but no inflammation or obstructive change at this site. 4. Small volume abdominopelvic ascites, new from prior exam. Generalized body wall edema/anasarca. 5. Dependent ground-glass opacities in the lung bases may represent hypoventilatory change or infection. Aspiration is also considered. 6. Known lytic lesions, without significant change. Aortic Atherosclerosis (ICD10-I70.0). Electronically Signed   By: Andrea Gasman M.D.   On: 10/14/2023 15:58   DG Chest Portable 1 View Result Date: 10/14/2023 CLINICAL DATA:  repeated forceful vomiting EXAM: PORTABLE CHEST 1 VIEW COMPARISON:  June 04, 2023, June 08, 2023 FINDINGS: The cardiomediastinal silhouette is unchanged in contour. No pleural effusion. No pneumothorax. Patchy bibasilar platelike opacities, favored to reflect atelectasis. Low lung volume radiograph. Rounded opacity at the LEFT lateral approximate seventh rib, likely a lytic lesion this level. Gaseous distension of bowel beneath the diaphragm. Possible pathologic fracture of the RIGHT posterior sixth rib, similar compared to prior IMPRESSION: 1. Low lung volume radiograph with bibasilar atelectasis. 2. Rounded opacity at the LEFT lateral approximate seventh rib, likely a lytic lesion.  Possible pathologic fracture of the RIGHT posterior sixth rib, similar compared to prior. 3. If clinical concern for intraperitoneal free air, recommend dedicated cross-sectional imaging. Electronically Signed   By: Corean Salter M.D.   On: 10/14/2023 14:18     Subjective: Patient seen examined at bedside.  No  complaints this morning.  Seen by general surgery and diet advanced and signed off and okay for discharge home.  Patient with no other questions, concerns or complaints at this time.  Denies headache, no dizziness, no chest pain, no palpitations, no shortness of breath, no abdominal pain, no fever/chills/night sweats, no nausea/vomiting/diarrhea, no focal weakness, no fatigue, no paresthesias.  No acute events overnight per nursing staff.  Discharge Exam: Vitals:   11/04/23 2209 11/05/23 0548  BP: 119/86 114/62  Pulse: (!) 41 61  Resp: 20 15  Temp: 98.1 F (36.7 C) 97.9 F (36.6 C)  SpO2: 100% 100%   Vitals:   11/04/23 1342 11/04/23 2020 11/04/23 2209 11/05/23 0548  BP: 112/63 117/80 119/86 114/62  Pulse: (!) 41  (!) 41 61  Resp: 15 18 20 15   Temp: 98.1 F (36.7 C) 98 F (36.7 C) 98.1 F (36.7 C) 97.9 F (36.6 C)  TempSrc: Oral Oral Oral Oral  SpO2: 93% 100% 100% 100%  Weight:      Height:        Physical Exam: GEN: NAD, alert and oriented x 3, wd/wn HEENT: NCAT, PERRL, EOMI, sclera clear, MMM PULM: CTAB w/o wheezes/crackles, normal respiratory effort, on room air CV: RRR w/o M/G/R GI: abd soft, NTND, NABS, no R/G/M MSK: no peripheral edema, muscle strength globally intact 5/5 bilateral upper/lower extremities NEURO: CN II-XII intact, no focal deficits, sensation to light touch intact PSYCH: normal mood/affect Integumentary: dry/intact, no rashes or wounds    The results of significant diagnostics from this hospitalization (including imaging, microbiology, ancillary and laboratory) are listed below for reference.     Microbiology: No results found for this or any previous visit (from the past 240 hours).   Labs: BNP (last 3 results) No results for input(s): BNP in the last 8760 hours. Basic Metabolic Panel: Recent Labs  Lab 11/02/23 1443 11/02/23 2138 11/03/23 0543 11/04/23 0721 11/05/23 0716  NA 135 135 135 130* 135  K 3.5 3.7 3.7 3.6 3.1*  CL 101 103  104 103 101  CO2 21* 19* 19* 23 28  GLUCOSE 111* 101* 81 106* 84  BUN 11 13 12 10 8   CREATININE 0.74 0.91 0.78 0.66 0.59*  CALCIUM  6.7* 6.7* 6.4* 6.2* 6.7*  MG 1.7 1.8 1.8 1.6* 2.0  PHOS  --  2.3* 2.6 1.7*  --    Liver Function Tests: Recent Labs  Lab 11/01/23 1125 11/02/23 1443 11/03/23 0543 11/04/23 0721  AST 27 27 25 27   ALT 31 30 25 25   ALKPHOS 57 62 53 54  BILITOT 0.5 0.5 0.9 0.8  PROT 5.4* 5.8* 4.9* 5.0*  ALBUMIN  2.8* 3.0* 2.2* 2.4*   No results for input(s): LIPASE, AMYLASE in the last 168 hours. No results for input(s): AMMONIA in the last 168 hours. CBC: Recent Labs  Lab 11/01/23 1125 11/02/23 1443 11/03/23 0543 11/04/23 0721 11/05/23 0716  WBC 12.5* 10.8* 8.7 11.4* 7.5  NEUTROABS 10.6* 9.6*  --   --   --   HGB 12.2* 12.4* 10.5* 11.2* 10.7*  HCT 35.2* 36.2* 32.6* 34.7* 32.7*  MCV 90.7 91.0 95.3 95.1 95.9  PLT 316 329 243 251 233   Cardiac Enzymes: Recent  Labs  Lab 11/02/23 2138  CKTOTAL 188   BNP: Invalid input(s): POCBNP CBG: No results for input(s): GLUCAP in the last 168 hours. D-Dimer No results for input(s): DDIMER in the last 72 hours. Hgb A1c No results for input(s): HGBA1C in the last 72 hours. Lipid Profile No results for input(s): CHOL, HDL, LDLCALC, TRIG, CHOLHDL, LDLDIRECT in the last 72 hours. Thyroid  function studies No results for input(s): TSH, T4TOTAL, T3FREE, THYROIDAB in the last 72 hours.  Invalid input(s): FREET3 Anemia work up No results for input(s): VITAMINB12, FOLATE, FERRITIN, TIBC, IRON, RETICCTPCT in the last 72 hours. Urinalysis    Component Value Date/Time   COLORURINE YELLOW 10/14/2023 1845   APPEARANCEUR CLOUDY (A) 10/14/2023 1845   LABSPEC >1.046 (H) 10/14/2023 1845   PHURINE 8.0 10/14/2023 1845   GLUCOSEU NEGATIVE 10/14/2023 1845   HGBUR SMALL (A) 10/14/2023 1845   BILIRUBINUR NEGATIVE 10/14/2023 1845   KETONESUR NEGATIVE 10/14/2023 1845   PROTEINUR 100  (A) 10/14/2023 1845   NITRITE NEGATIVE 10/14/2023 1845   LEUKOCYTESUR LARGE (A) 10/14/2023 1845   Sepsis Labs Recent Labs  Lab 11/02/23 1443 11/03/23 0543 11/04/23 0721 11/05/23 0716  WBC 10.8* 8.7 11.4* 7.5   Microbiology No results found for this or any previous visit (from the past 240 hours).   Time coordinating discharge: Over 30 minutes  SIGNED:   Camellia PARAS [place], DO  [facility] 11/05/2023, 10:11 AM

## 2023-11-05 NOTE — Progress Notes (Signed)
   11/05/23 1031  TOC Brief Assessment  Insurance and Status Reviewed  Patient has primary care physician Yes  Home environment has been reviewed home w/ spouse  Prior level of function: independent  Prior/Current Home Services No current home services  Social Drivers of Health Review SDOH reviewed no interventions necessary  Readmission risk has been reviewed Yes  Transition of care needs no transition of care needs at this time   [facility] setup w/ [facility]

## 2023-11-05 NOTE — Progress Notes (Signed)
 2 Days Post-Op   Subjective/Chief Complaint: Patient feels well.  He is moving his bowels.  He had some diarrhea this morning but otherwise has no abdominal pain.   Objective: Vital signs in last 24 hours: Temp:  [97.9 F (36.6 C)-98.1 F (36.7 C)] 97.9 F (36.6 C) (08/03 0548) Pulse Rate:  [41-61] 61 (08/03 0548) Resp:  [15-20] 15 (08/03 0548) BP: (112-119)/(62-86) 114/62 (08/03 0548) SpO2:  [93 %-100 %] 100 % (08/03 0548) Last BM Date : 11/04/23  Intake/Output from previous day: 08/02 0701 - 08/03 0700 In: 527 [P.O.:477; IV Piggyback:50] Out: -  Intake/Output this shift: No intake/output data recorded.  Abdomen: Port sites clean dry intact.  Flat soft nontender  Lab Results:  Recent Labs    11/03/23 0543 11/04/23 0721  WBC 8.7 11.4*  HGB 10.5* 11.2*  HCT 32.6* 34.7*  PLT 243 251   BMET Recent Labs    11/03/23 0543 11/04/23 0721  NA 135 130*  K 3.7 3.6  CL 104 103  CO2 19* 23  GLUCOSE 81 106*  BUN 12 10  CREATININE 0.78 0.66  CALCIUM  6.4* 6.2*   PT/INR No results for input(s): LABPROT, INR in the last 72 hours. ABG No results for input(s): PHART, HCO3 in the last 72 hours.  Invalid input(s): PCO2, PO2  Studies/Results: No results found.  Anti-infectives: Anti-infectives (From admission, onward)    Start     Dose/Rate Route Frequency Ordered Stop   11/03/23 0930  cefTRIAXone  (ROCEPHIN ) 2 g in sodium chloride  0.9 % 100 mL IVPB        2 g 200 mL/hr over 30 Minutes Intravenous On call to O.R. 11/03/23 9166 11/03/23 1152       Assessment/Plan: s/p Procedure(s) with comments: LAPAROSCOPY, DIAGNOSTIC (N/A) - POSSIBLE EX LAP Stable surgically.  Okay for discharge.  He needs no follow-up with us  unless she has a problem.  May resume activity as tolerated.  A shower today or tomorrow and drive when he wants to.  I have discussed this with him today.  LOS: 3 days    Debby LABOR Treston Coker 11/05/2023

## 2023-11-05 NOTE — Discharge Instructions (Signed)
 [facility] ______[facility] LAPAROSCOPIC SURGERY: POST OP INSTRUCTIONS Always review your discharge instruction sheet given to you by the facility where your surgery was performed. IF YOU HAVE DISABILITY OR FAMILY LEAVE FORMS, YOU MUST BRING THEM TO THE OFFICE FOR PROCESSING.   DO NOT GIVE THEM TO YOUR DOCTOR.  A prescription for pain medication may be given to you upon discharge.  Take your pain medication as prescribed, if needed.  If narcotic pain medicine is not needed, then you may take acetaminophen  (Tylenol ) or ibuprofen (Advil) as needed. Take your usually prescribed medications unless otherwise directed. If you need a refill on your pain medication, please contact your pharmacy.  They will contact our office to request authorization. Prescriptions will not be filled after 5pm or on week-ends. You should follow a light diet the first few days after arrival home, such as soup and crackers, etc.  Be sure to include lots of fluids daily. Most patients will experience some swelling and bruising in the area of the incisions.  Ice packs will help.  Swelling and bruising can take several days to resolve.  It is common to experience some constipation if taking pain medication after surgery.  Increasing fluid intake and taking a stool softener (such as Colace) will usually help or prevent this problem from occurring.  A mild laxative (Milk of Magnesia or Miralax) should be taken according to package instructions if there are no bowel movements after 48 hours. Unless discharge instructions indicate otherwise, you may remove your bandages 24-48 hours after surgery, and you may shower at that time.  You may have steri-strips (small skin tapes) in place directly over the incision.  These strips should be left on the skin for 7-10 days.  If your surgeon used skin glue on the incision, you may shower in 24 hours.  The glue will flake off over the next 2-3 weeks.  Any sutures or staples will be  removed at the office during your follow-up visit. ACTIVITIES:  You may resume regular (light) daily activities beginning the next day--such as daily self-care, walking, climbing stairs--gradually increasing activities as tolerated.  You may have sexual intercourse when it is comfortable.  Refrain from any heavy lifting or straining until approved by your doctor. You may drive when you are no longer taking prescription pain medication, you can comfortably wear a seatbelt, and you can safely maneuver your car and apply brakes. RETURN TO WORK:  __Resume activity as tolerated ________________________________________________________ David Mcgrath should see your doctor in the office for a follow-up appointment approximately 2-3 weeks after your surgery.  Make sure that you call for this appointment within a day or two after you arrive home to insure a convenient appointment time. OTHER INSTRUCTIONS: __________________________________________________________________________________________________________________________ __________________________________________________________________________________________________________________________ WHEN TO CALL YOUR DOCTOR: Fever over 101.0 Inability to urinate Continued bleeding from incision. Increased pain, redness, or drainage from the incision. Increasing abdominal pain  The clinic staff is available to answer your questions during regular business hours.  Please don't hesitate to call and ask to speak to one of the nurses for clinical concerns.  If you have a medical emergency, go to the nearest emergency room or call 911.  A surgeon from [facility] is always on call at the hospital. [address], [place], [place]  [address] ? [address] [address], [place], [place]   [address] [phone] ? [phone] ? FAX [phone] Web site: [email or web address]

## 2023-11-05 NOTE — Progress Notes (Signed)
 [name] to be D/C'd Home per MD order.  Discussed with the patient and all questions fully answered.  VSS, Skin clean, dry and intact without evidence of skin break down, no evidence of skin tears noted. IV catheter discontinued intact. Site without signs and symptoms of complications. Dressing and pressure applied.  An After Visit Summary was printed and given to the patient.  D/c education completed with patient/family including follow up instructions, medication list, d/c activities limitations if indicated, with other d/c instructions as indicated by MD - patient able to verbalize understanding, all questions fully answered.   Patient instructed to return to ED, call 911, or call MD for any changes in condition.   Patient escorted via WC, and D/C home via private auto.  [name] 11/05/2023 12:00 PM

## 2023-11-06 DIAGNOSIS — R112 Nausea with vomiting, unspecified: Secondary | ICD-10-CM | POA: Diagnosis not present

## 2023-11-08 ENCOUNTER — Ambulatory Visit: Payer: Medicare Other | Admitting: Neurology

## 2023-11-09 ENCOUNTER — Inpatient Hospital Stay: Attending: Physician Assistant

## 2023-11-09 ENCOUNTER — Inpatient Hospital Stay: Admitting: Dietician

## 2023-11-09 ENCOUNTER — Inpatient Hospital Stay (HOSPITAL_BASED_OUTPATIENT_CLINIC_OR_DEPARTMENT_OTHER)

## 2023-11-09 ENCOUNTER — Other Ambulatory Visit: Payer: Self-pay | Admitting: Hematology and Oncology

## 2023-11-09 ENCOUNTER — Encounter: Payer: Self-pay | Admitting: Hematology and Oncology

## 2023-11-09 ENCOUNTER — Other Ambulatory Visit: Payer: Self-pay | Admitting: *Deleted

## 2023-11-09 VITALS — BP 112/62 | HR 62 | Temp 98.0°F | Resp 18 | Wt 177.5 lb

## 2023-11-09 DIAGNOSIS — C9000 Multiple myeloma not having achieved remission: Secondary | ICD-10-CM | POA: Insufficient documentation

## 2023-11-09 DIAGNOSIS — Z5112 Encounter for antineoplastic immunotherapy: Secondary | ICD-10-CM | POA: Diagnosis not present

## 2023-11-09 DIAGNOSIS — Z79899 Other long term (current) drug therapy: Secondary | ICD-10-CM | POA: Insufficient documentation

## 2023-11-09 LAB — CBC WITH DIFFERENTIAL (CANCER CENTER ONLY)
Abs Immature Granulocytes: 0.07 K/uL (ref 0.00–0.07)
Basophils Absolute: 0 K/uL (ref 0.0–0.1)
Basophils Relative: 0 %
Eosinophils Absolute: 0 K/uL (ref 0.0–0.5)
Eosinophils Relative: 0 %
HCT: 34.7 % — ABNORMAL LOW (ref 39.0–52.0)
Hemoglobin: 11.7 g/dL — ABNORMAL LOW (ref 13.0–17.0)
Immature Granulocytes: 1 %
Lymphocytes Relative: 3 %
Lymphs Abs: 0.3 K/uL — ABNORMAL LOW (ref 0.7–4.0)
MCH: 31 pg (ref 26.0–34.0)
MCHC: 33.7 g/dL (ref 30.0–36.0)
MCV: 91.8 fL (ref 80.0–100.0)
Monocytes Absolute: 0.1 K/uL (ref 0.1–1.0)
Monocytes Relative: 2 %
Neutro Abs: 8.6 K/uL — ABNORMAL HIGH (ref 1.7–7.7)
Neutrophils Relative %: 94 %
Platelet Count: 290 K/uL (ref 150–400)
RBC: 3.78 MIL/uL — ABNORMAL LOW (ref 4.22–5.81)
RDW: 16.4 % — ABNORMAL HIGH (ref 11.5–15.5)
WBC Count: 9.1 K/uL (ref 4.0–10.5)
nRBC: 0 % (ref 0.0–0.2)

## 2023-11-09 LAB — CMP (CANCER CENTER ONLY)
ALT: 32 U/L (ref 0–44)
AST: 36 U/L (ref 15–41)
Albumin: 3 g/dL — ABNORMAL LOW (ref 3.5–5.0)
Alkaline Phosphatase: 64 U/L (ref 38–126)
Anion gap: 7 (ref 5–15)
BUN: 9 mg/dL (ref 8–23)
CO2: 25 mmol/L (ref 22–32)
Calcium: 7.7 mg/dL — ABNORMAL LOW (ref 8.9–10.3)
Chloride: 100 mmol/L (ref 98–111)
Creatinine: 0.72 mg/dL (ref 0.61–1.24)
GFR, Estimated: >60 mL/min (ref >60)
Glucose, Bld: 106 mg/dL — ABNORMAL HIGH (ref 70–99)
Potassium: 3.8 mmol/L (ref 3.5–5.1)
Sodium: 132 mmol/L — ABNORMAL LOW (ref 135–145)
Total Bilirubin: 0.5 mg/dL (ref 0.0–1.2)
Total Protein: 5.5 g/dL — ABNORMAL LOW (ref 6.5–8.1)

## 2023-11-09 LAB — MAGNESIUM: Magnesium: 1.3 mg/dL — ABNORMAL LOW (ref 1.7–2.4)

## 2023-11-09 MED ORDER — BORTEZOMIB CHEMO SQ INJECTION 3.5 MG (2.5MG/ML)
1.0000 mg/m2 | Freq: Once | INTRAMUSCULAR | Status: AC
  Administered 2023-11-09: 2 mg via SUBCUTANEOUS
  Filled 2023-11-09: qty 0.8

## 2023-11-09 NOTE — Progress Notes (Signed)
 Per Dr. Federico, ok to use new wt to calculate Bortezomib  dose.   Tx plan adjusted to 2 mg (BSA = 2.08 m2)  Wilma Dollar, Pharm.D., CPP 11/09/2023@4 :07 PM

## 2023-11-09 NOTE — Progress Notes (Signed)
 Nutrition Follow-up: Reached out to patient and spouse at home # which is [employer] mobile#. Pt recently hospitalized with intractable nausea, vomiting with concern of recurrent small bowel obstruction, poor oral intake and inability to tolerate medications. Liquid diet was advanced to regular diet. Last vomited 8/5 after drinking a homemade smoothie ( Vanilla protein powder, lactose free milk, frozen strawberries blueberries, blueberries, and banana).  Bowels were loose watery diarrhea since discharge until this morning had small formed stool.  He scheduled this afternoon for his Velcade  treatment and labs.  Wife provided PO recall past day:  Cup apple juice, 4oz jello Chicken broth 5 saltines Soft boiled egg, toast Plenty of water    Medications: KCL, omeprazole    Labs: K 3.0, glucose 156, BUN 5, albumin  2.9 (trending up 2.6 on 6/12)   Anthropometrics:  11/03/23  202.1# 11/02/23  202.1# 10/14/23  224.8# 6/5 - 204 lb 8 oz 5/21 - 212 lb 3.2 oz      NUTRITION DIAGNOSIS: Unintentional wt loss - ongoing   INTERVENTION:  Relayed importance of chewing in digestive process to reduce risk of bowel obstruction. Reviewed strategies for diarrhea, avoiding skins and seeds encouraged electrolyte replacement drinks, small frequent feeds Trial lactose free milk or protein shake BID between attempted feeds Encouraged higher calorie beverages for hydration while PO intake decreased Encourage small frequent feeds of low fiber foods, with gradual increase in soluble fibers. Sent text with contact info to [employer].     MONITORING, EVALUATION, GOAL: wt trends, intake  Weight maintenance   NEXT VISIT: Next week during infusion with Elvie Micheline Craven, RDN, LDN Registered Dietitian, [facility] Part Time Remote (Usual office hours: Tuesday-Thursday) Mobile: [phone] Remote Office: [phone]

## 2023-11-09 NOTE — Patient Instructions (Signed)
 [facility]  Discharge Instructions: Thank you for choosing [facility] to provide your oncology and hematology care.   If you have a lab appointment with the Cancer Center, please go directly to the Cancer Center and check in at the registration area.   Wear comfortable clothing and clothing appropriate for easy access to any Portacath or PICC line.   We strive to give you quality time with your provider. You may need to reschedule your appointment if you arrive late (15 or more minutes).  Arriving late affects you and other patients whose appointments are after yours.  Also, if you miss three or more appointments without notifying the office, you may be dismissed from the clinic at the provider's discretion.      For prescription refill requests, have your pharmacy contact our office and allow 72 hours for refills to be completed.    Today you received the following chemotherapy and/or immunotherapy agents: Velcade    To help prevent nausea and vomiting after your treatment, we encourage you to take your nausea medication as directed.  BELOW ARE SYMPTOMS THAT SHOULD BE REPORTED IMMEDIATELY: *FEVER GREATER THAN 100.4 F (38 C) OR HIGHER *CHILLS OR SWEATING *NAUSEA AND VOMITING THAT IS NOT CONTROLLED WITH YOUR NAUSEA MEDICATION *UNUSUAL SHORTNESS OF BREATH *UNUSUAL BRUISING OR BLEEDING *URINARY PROBLEMS (pain or burning when urinating, or frequent urination) *BOWEL PROBLEMS (unusual diarrhea, constipation, pain near the anus) TENDERNESS IN MOUTH AND THROAT WITH OR WITHOUT PRESENCE OF ULCERS (sore throat, sores in mouth, or a toothache) UNUSUAL RASH, SWELLING OR PAIN  UNUSUAL VAGINAL DISCHARGE OR ITCHING   Items with * indicate a potential emergency and should be followed up as soon as possible or go to the Emergency Department if any problems should occur.  Please show the CHEMOTHERAPY ALERT CARD or IMMUNOTHERAPY  ALERT CARD at check-in to the Emergency Department and triage nurse.  Should you have questions after your visit or need to cancel or reschedule your appointment, please contact [facility] - [facility]  Dept: [phone]  and follow the prompts.  Office hours are 8:00 a.m. to 4:30 p.m. Monday - Friday. Please note that voicemails left after 4:00 p.m. may not be returned until the following business day.  We are closed weekends and major holidays. You have access to a nurse at all times for urgent questions. Please call the main number to the clinic Dept: [phone] and follow the prompts.   For any non-urgent questions, you may also contact your provider using MyChart. We now offer e-Visits for anyone 69 and older to request care online for non-urgent symptoms. For details visit [email or web address].   Also download the MyChart app! Go to the app store, search "MyChart", open the app, select , and log in with your MyChart username and password.

## 2023-11-09 NOTE — Progress Notes (Signed)
 Proceed w/ Bortezomib  today per Dr. Federico.  Danitra Payano, Pharm.D., CPP 11/09/2023@4 :01 PM

## 2023-11-10 LAB — KAPPA/LAMBDA LIGHT CHAINS
Kappa free light chain: 55.1 mg/L — ABNORMAL HIGH (ref 3.3–19.4)
Kappa, lambda light chain ratio: 1.93 — ABNORMAL HIGH (ref 0.26–1.65)
Lambda free light chains: 28.6 mg/L — ABNORMAL HIGH (ref 5.7–26.3)

## 2023-11-11 ENCOUNTER — Other Ambulatory Visit: Payer: Self-pay

## 2023-11-12 ENCOUNTER — Other Ambulatory Visit: Payer: Self-pay

## 2023-11-12 ENCOUNTER — Emergency Department (HOSPITAL_COMMUNITY)

## 2023-11-12 ENCOUNTER — Encounter (HOSPITAL_COMMUNITY): Payer: Self-pay | Admitting: Emergency Medicine

## 2023-11-12 ENCOUNTER — Inpatient Hospital Stay (HOSPITAL_COMMUNITY)
Admission: EM | Admit: 2023-11-12 | Discharge: 2024-01-03 | DRG: 388 | Disposition: E | Attending: Internal Medicine | Admitting: Internal Medicine

## 2023-11-12 DIAGNOSIS — E860 Dehydration: Secondary | ICD-10-CM | POA: Diagnosis present

## 2023-11-12 DIAGNOSIS — Z7901 Long term (current) use of anticoagulants: Secondary | ICD-10-CM

## 2023-11-12 DIAGNOSIS — E861 Hypovolemia: Secondary | ICD-10-CM | POA: Diagnosis not present

## 2023-11-12 DIAGNOSIS — R627 Adult failure to thrive: Secondary | ICD-10-CM | POA: Diagnosis present

## 2023-11-12 DIAGNOSIS — K56609 Unspecified intestinal obstruction, unspecified as to partial versus complete obstruction: Secondary | ICD-10-CM

## 2023-11-12 DIAGNOSIS — N401 Enlarged prostate with lower urinary tract symptoms: Secondary | ICD-10-CM | POA: Diagnosis present

## 2023-11-12 DIAGNOSIS — E872 Acidosis, unspecified: Secondary | ICD-10-CM | POA: Diagnosis not present

## 2023-11-12 DIAGNOSIS — I472 Ventricular tachycardia, unspecified: Secondary | ICD-10-CM | POA: Diagnosis not present

## 2023-11-12 DIAGNOSIS — R42 Dizziness and giddiness: Secondary | ICD-10-CM | POA: Diagnosis not present

## 2023-11-12 DIAGNOSIS — Z515 Encounter for palliative care: Secondary | ICD-10-CM

## 2023-11-12 DIAGNOSIS — I959 Hypotension, unspecified: Secondary | ICD-10-CM | POA: Diagnosis not present

## 2023-11-12 DIAGNOSIS — L89152 Pressure ulcer of sacral region, stage 2: Secondary | ICD-10-CM | POA: Diagnosis present

## 2023-11-12 DIAGNOSIS — I10 Essential (primary) hypertension: Secondary | ICD-10-CM | POA: Diagnosis present

## 2023-11-12 DIAGNOSIS — C9000 Multiple myeloma not having achieved remission: Secondary | ICD-10-CM | POA: Diagnosis present

## 2023-11-12 DIAGNOSIS — K567 Ileus, unspecified: Principal | ICD-10-CM | POA: Diagnosis present

## 2023-11-12 DIAGNOSIS — I4729 Other ventricular tachycardia: Secondary | ICD-10-CM | POA: Insufficient documentation

## 2023-11-12 DIAGNOSIS — E43 Unspecified severe protein-calorie malnutrition: Secondary | ICD-10-CM | POA: Diagnosis present

## 2023-11-12 DIAGNOSIS — L8990 Pressure ulcer of unspecified site, unspecified stage: Secondary | ICD-10-CM | POA: Insufficient documentation

## 2023-11-12 DIAGNOSIS — Z6825 Body mass index (BMI) 25.0-25.9, adult: Secondary | ICD-10-CM

## 2023-11-12 DIAGNOSIS — Z681 Body mass index (BMI) 19 or less, adult: Secondary | ICD-10-CM

## 2023-11-12 DIAGNOSIS — Z66 Do not resuscitate: Secondary | ICD-10-CM | POA: Diagnosis not present

## 2023-11-12 DIAGNOSIS — R7881 Bacteremia: Secondary | ICD-10-CM | POA: Diagnosis not present

## 2023-11-12 DIAGNOSIS — B952 Enterococcus as the cause of diseases classified elsewhere: Secondary | ICD-10-CM | POA: Diagnosis not present

## 2023-11-12 DIAGNOSIS — K631 Perforation of intestine (nontraumatic): Secondary | ICD-10-CM | POA: Diagnosis present

## 2023-11-12 DIAGNOSIS — I499 Cardiac arrhythmia, unspecified: Secondary | ICD-10-CM | POA: Diagnosis not present

## 2023-11-12 DIAGNOSIS — K5289 Other specified noninfective gastroenteritis and colitis: Secondary | ICD-10-CM | POA: Diagnosis not present

## 2023-11-12 DIAGNOSIS — E8809 Other disorders of plasma-protein metabolism, not elsewhere classified: Secondary | ICD-10-CM | POA: Diagnosis present

## 2023-11-12 DIAGNOSIS — I491 Atrial premature depolarization: Secondary | ICD-10-CM | POA: Diagnosis not present

## 2023-11-12 DIAGNOSIS — E785 Hyperlipidemia, unspecified: Secondary | ICD-10-CM | POA: Diagnosis present

## 2023-11-12 DIAGNOSIS — K30 Functional dyspepsia: Secondary | ICD-10-CM | POA: Diagnosis present

## 2023-11-12 DIAGNOSIS — Z79899 Other long term (current) drug therapy: Secondary | ICD-10-CM

## 2023-11-12 DIAGNOSIS — I480 Paroxysmal atrial fibrillation: Secondary | ICD-10-CM | POA: Diagnosis present

## 2023-11-12 DIAGNOSIS — T287XXA Corrosion of other parts of alimentary tract, initial encounter: Secondary | ICD-10-CM | POA: Diagnosis present

## 2023-11-12 DIAGNOSIS — I4510 Unspecified right bundle-branch block: Secondary | ICD-10-CM | POA: Diagnosis not present

## 2023-11-12 DIAGNOSIS — R1084 Generalized abdominal pain: Principal | ICD-10-CM

## 2023-11-12 DIAGNOSIS — I82592 Chronic embolism and thrombosis of other specified deep vein of left lower extremity: Secondary | ICD-10-CM | POA: Diagnosis present

## 2023-11-12 DIAGNOSIS — N3289 Other specified disorders of bladder: Secondary | ICD-10-CM | POA: Diagnosis not present

## 2023-11-12 DIAGNOSIS — R230 Cyanosis: Secondary | ICD-10-CM | POA: Diagnosis not present

## 2023-11-12 DIAGNOSIS — N390 Urinary tract infection, site not specified: Secondary | ICD-10-CM | POA: Diagnosis not present

## 2023-11-12 DIAGNOSIS — R112 Nausea with vomiting, unspecified: Secondary | ICD-10-CM | POA: Diagnosis present

## 2023-11-12 DIAGNOSIS — K2970 Gastritis, unspecified, without bleeding: Secondary | ICD-10-CM | POA: Diagnosis present

## 2023-11-12 DIAGNOSIS — G629 Polyneuropathy, unspecified: Secondary | ICD-10-CM | POA: Diagnosis present

## 2023-11-12 DIAGNOSIS — N179 Acute kidney failure, unspecified: Secondary | ICD-10-CM | POA: Diagnosis not present

## 2023-11-12 DIAGNOSIS — F32A Depression, unspecified: Secondary | ICD-10-CM | POA: Diagnosis present

## 2023-11-12 DIAGNOSIS — G2401 Drug induced subacute dyskinesia: Secondary | ICD-10-CM | POA: Diagnosis not present

## 2023-11-12 DIAGNOSIS — Z91013 Allergy to seafood: Secondary | ICD-10-CM

## 2023-11-12 DIAGNOSIS — I951 Orthostatic hypotension: Secondary | ICD-10-CM | POA: Diagnosis not present

## 2023-11-12 DIAGNOSIS — Z8249 Family history of ischemic heart disease and other diseases of the circulatory system: Secondary | ICD-10-CM

## 2023-11-12 DIAGNOSIS — R338 Other retention of urine: Secondary | ICD-10-CM

## 2023-11-12 DIAGNOSIS — R64 Cachexia: Secondary | ICD-10-CM | POA: Diagnosis present

## 2023-11-12 DIAGNOSIS — E162 Hypoglycemia, unspecified: Secondary | ICD-10-CM | POA: Diagnosis not present

## 2023-11-12 DIAGNOSIS — Z83438 Family history of other disorder of lipoprotein metabolism and other lipidemia: Secondary | ICD-10-CM

## 2023-11-12 DIAGNOSIS — E870 Hyperosmolality and hypernatremia: Secondary | ICD-10-CM | POA: Diagnosis not present

## 2023-11-12 DIAGNOSIS — R54 Age-related physical debility: Secondary | ICD-10-CM | POA: Diagnosis present

## 2023-11-12 DIAGNOSIS — R109 Unspecified abdominal pain: Secondary | ICD-10-CM | POA: Diagnosis present

## 2023-11-12 DIAGNOSIS — R197 Diarrhea, unspecified: Secondary | ICD-10-CM | POA: Diagnosis not present

## 2023-11-12 DIAGNOSIS — R262 Difficulty in walking, not elsewhere classified: Secondary | ICD-10-CM | POA: Diagnosis present

## 2023-11-12 DIAGNOSIS — E876 Hypokalemia: Secondary | ICD-10-CM | POA: Diagnosis not present

## 2023-11-12 DIAGNOSIS — R7401 Elevation of levels of liver transaminase levels: Secondary | ICD-10-CM

## 2023-11-12 DIAGNOSIS — Z888 Allergy status to other drugs, medicaments and biological substances status: Secondary | ICD-10-CM

## 2023-11-12 DIAGNOSIS — X58XXXA Exposure to other specified factors, initial encounter: Secondary | ICD-10-CM | POA: Diagnosis present

## 2023-11-12 DIAGNOSIS — S2231XD Fracture of one rib, right side, subsequent encounter for fracture with routine healing: Secondary | ICD-10-CM | POA: Diagnosis not present

## 2023-11-12 DIAGNOSIS — T5494XA Toxic effect of unspecified corrosive substance, undetermined, initial encounter: Secondary | ICD-10-CM | POA: Diagnosis present

## 2023-11-12 DIAGNOSIS — E663 Overweight: Secondary | ICD-10-CM | POA: Diagnosis present

## 2023-11-12 DIAGNOSIS — F419 Anxiety disorder, unspecified: Secondary | ICD-10-CM | POA: Diagnosis present

## 2023-11-12 DIAGNOSIS — K5641 Fecal impaction: Secondary | ICD-10-CM | POA: Diagnosis not present

## 2023-11-12 DIAGNOSIS — E871 Hypo-osmolality and hyponatremia: Secondary | ICD-10-CM | POA: Diagnosis not present

## 2023-11-12 DIAGNOSIS — I82513 Chronic embolism and thrombosis of femoral vein, bilateral: Secondary | ICD-10-CM | POA: Diagnosis present

## 2023-11-12 DIAGNOSIS — K219 Gastro-esophageal reflux disease without esophagitis: Secondary | ICD-10-CM | POA: Diagnosis present

## 2023-11-12 DIAGNOSIS — M7989 Other specified soft tissue disorders: Secondary | ICD-10-CM

## 2023-11-12 DIAGNOSIS — E875 Hyperkalemia: Secondary | ICD-10-CM | POA: Diagnosis not present

## 2023-11-12 DIAGNOSIS — R040 Epistaxis: Secondary | ICD-10-CM | POA: Diagnosis not present

## 2023-11-12 DIAGNOSIS — K760 Fatty (change of) liver, not elsewhere classified: Secondary | ICD-10-CM | POA: Diagnosis present

## 2023-11-12 DIAGNOSIS — K449 Diaphragmatic hernia without obstruction or gangrene: Secondary | ICD-10-CM | POA: Diagnosis present

## 2023-11-12 DIAGNOSIS — D539 Nutritional anemia, unspecified: Secondary | ICD-10-CM | POA: Diagnosis not present

## 2023-11-12 HISTORY — DX: Multiple myeloma not having achieved remission: C90.00

## 2023-11-12 LAB — CBC WITH DIFFERENTIAL/PLATELET
Abs Immature Granulocytes: 0.1 K/uL — ABNORMAL HIGH (ref 0.00–0.07)
Basophils Absolute: 0 K/uL (ref 0.0–0.1)
Basophils Relative: 0 %
Eosinophils Absolute: 0 K/uL (ref 0.0–0.5)
Eosinophils Relative: 0 %
HCT: 38.4 % — ABNORMAL LOW (ref 39.0–52.0)
Hemoglobin: 12.6 g/dL — ABNORMAL LOW (ref 13.0–17.0)
Immature Granulocytes: 1 %
Lymphocytes Relative: 8 %
Lymphs Abs: 0.8 K/uL (ref 0.7–4.0)
MCH: 30.9 pg (ref 26.0–34.0)
MCHC: 32.8 g/dL (ref 30.0–36.0)
MCV: 94.1 fL (ref 80.0–100.0)
Monocytes Absolute: 0.9 K/uL (ref 0.1–1.0)
Monocytes Relative: 9 %
Neutro Abs: 8.2 K/uL — ABNORMAL HIGH (ref 1.7–7.7)
Neutrophils Relative %: 82 %
Platelets: 218 K/uL (ref 150–400)
RBC: 4.08 MIL/uL — ABNORMAL LOW (ref 4.22–5.81)
RDW: 16.5 % — ABNORMAL HIGH (ref 11.5–15.5)
WBC: 10 K/uL (ref 4.0–10.5)
nRBC: 0 % (ref 0.0–0.2)

## 2023-11-12 LAB — COMPREHENSIVE METABOLIC PANEL WITH GFR
ALT: 33 U/L (ref 0–44)
AST: 32 U/L (ref 15–41)
Albumin: 2.6 g/dL — ABNORMAL LOW (ref 3.5–5.0)
Alkaline Phosphatase: 50 U/L (ref 38–126)
Anion gap: 12 (ref 5–15)
BUN: 9 mg/dL (ref 8–23)
CO2: 22 mmol/L (ref 22–32)
Calcium: 7.5 mg/dL — ABNORMAL LOW (ref 8.9–10.3)
Chloride: 98 mmol/L (ref 98–111)
Creatinine, Ser: 0.87 mg/dL (ref 0.61–1.24)
GFR, Estimated: >60 mL/min (ref >60)
Glucose, Bld: 80 mg/dL (ref 70–99)
Potassium: 3.5 mmol/L (ref 3.5–5.1)
Sodium: 132 mmol/L — ABNORMAL LOW (ref 135–145)
Total Bilirubin: 0.5 mg/dL (ref 0.0–1.2)
Total Protein: 5.7 g/dL — ABNORMAL LOW (ref 6.5–8.1)

## 2023-11-12 LAB — I-STAT CG4 LACTIC ACID, ED: Lactic Acid, Venous: 1.4 mmol/L (ref 0.5–1.9)

## 2023-11-12 MED ORDER — IOHEXOL 300 MG/ML  SOLN
100.0000 mL | Freq: Once | INTRAMUSCULAR | Status: AC | PRN
  Administered 2023-11-12: 100 mL via INTRAVENOUS

## 2023-11-12 NOTE — ED Provider Notes (Signed)
 [facility] Emergency Department Provider Note MRN:  [id number]  Arrival date & time: 11/13/23     Chief Complaint   Weakness, Dizziness, and Diarrhea (/)   History of Present Illness   David Mcgrath is a 73 y.o. year-old male presents to the ED with chief complaint of nausea and diarrhea.  Reports associated crampy abdominal pain.  Has recent diagnosis of multiple myeloma and takes Velcade  and Revlimid .  States that he was recently admitted at [facility] long for intractable nausea, vomiting, and recurrent small bowel obstruction.  He states that he feels similar to this again.  He attributes his symptoms to taking his injections of the above medications.  He states that he has not been able to eat anything today.  States that symptoms have been gradually worsening since Thursday when he took his injections.  Denies fevers or chills.  EMS reports that he has complained of some urinary retention, but patient states that he is voiding normally.SABRA  History provided by patient.   Review of Systems  Pertinent positive and negative review of systems noted in HPI.    Physical Exam   Vitals:   11/12/23 2300 11/13/23 0000  BP: 103/76   Pulse: 88 78  Resp: (!) 23   Temp:    SpO2: 99% 100%    CONSTITUTIONAL:  non toxic-appearing, NAD NEURO:  Alert and oriented x 3, CN 3-12 grossly intact EYES:  eyes equal and reactive ENT/NECK:  Supple, no stridor  CARDIO:  normal rate, regular rhythm, appears well-perfused  PULM:  No respiratory distress, CTAB GI/GU:  non-distended, no focal abdominal tederness MSK/SPINE:  No gross deformities, no edema, moves all extremities  SKIN:  no rash, atraumatic   *Additional and/or pertinent findings included in MDM below  Diagnostic and Interventional Summary    EKG Interpretation Date/Time:    Ventricular Rate:    PR Interval:    QRS Duration:    QT Interval:    QTC Calculation:   R Axis:      Text  Interpretation:         Labs Reviewed  COMPREHENSIVE METABOLIC PANEL WITH GFR - Abnormal; Notable for the following components:      Result Value   Sodium 132 (*)    Calcium  7.5 (*)    Total Protein 5.7 (*)    Albumin  2.6 (*)    All other components within normal limits  CBC WITH DIFFERENTIAL/PLATELET - Abnormal; Notable for the following components:   RBC 4.08 (*)    Hemoglobin 12.6 (*)    HCT 38.4 (*)    RDW 16.5 (*)    Neutro Abs 8.2 (*)    Abs Immature Granulocytes 0.10 (*)    All other components within normal limits  CULTURE, BLOOD (ROUTINE X 2)  CULTURE, BLOOD (ROUTINE X 2)  PROTIME-INR  URINALYSIS, W/ REFLEX TO CULTURE (INFECTION SUSPECTED)  I-STAT CG4 LACTIC ACID, ED  I-STAT CG4 LACTIC ACID, ED    CT ABDOMEN PELVIS W CONTRAST  Final Result    DG Chest Port 1 View  Final Result      Medications  iohexol  (OMNIPAQUE ) 300 MG/ML solution 100 mL (100 mLs Intravenous Contrast Given 11/12/23 2347)     Procedures  /  Critical Care Procedures  ED Course and Medical Decision Making  I have reviewed the triage vital signs, the nursing notes, and pertinent available records from the EMR.  Social Determinants Affecting Complexity of Care: Patient has no clinically significant  social determinants affecting this chief complaint..   ED Course:    Medical Decision Making Patient here with abdominal pain.  Recently admitted for small bowel obstruction.  He questions whether this is associated with his multiple myeloma medications.  CT today shows increased small bowel dilatation.  There is some caliber changes, but no definite transition point.  Given his symptoms, and how there is similar to what he experienced last time, feel that observation admission is indicated.  He does not appear septic or toxic.  Amount and/or Complexity of Data Reviewed Labs: ordered. Radiology: ordered.  Risk Prescription drug management. Decision regarding hospitalization.          Consultants: I consulted with Hospitalist, Dr. Shona, who is appreciated for admitting.   Treatment and Plan: Patient's exam and diagnostic results are concerning for early SBO.  Feel that patient will need admission to the hospital for further treatment and evaluation.    Final Clinical Impressions(s) / ED Diagnoses     ICD-10-CM   1. Generalized abdominal pain  R10.84       ED Discharge Orders     None         Discharge Instructions Discussed with and Provided to Patient:   Discharge Instructions   None      David Charleston, PA-C 11/13/23 0104    David Meth, MD 11/13/23 [phone]

## 2023-11-12 NOTE — ED Triage Notes (Signed)
 Pt to ED via [facility] from home c/o weakness, dizziness, and diarrhea x3 days.  States these symptoms have been intermittent since May when dx with multiple myeloma and started on immunotherapy injection once a week, last shot this past Thursday.  States diarrhea x2 today, denies falls. Denies pain or SOB, A&Ox4.  EMS vitals 90/60 BP, 100% RA, 68 HR, 94 CBG, given 500cc NS and 500cc LR en route.

## 2023-11-13 ENCOUNTER — Other Ambulatory Visit: Payer: Self-pay | Admitting: Hematology and Oncology

## 2023-11-13 DIAGNOSIS — I472 Ventricular tachycardia, unspecified: Secondary | ICD-10-CM | POA: Diagnosis not present

## 2023-11-13 DIAGNOSIS — G4733 Obstructive sleep apnea (adult) (pediatric): Secondary | ICD-10-CM | POA: Diagnosis not present

## 2023-11-13 DIAGNOSIS — R109 Unspecified abdominal pain: Secondary | ICD-10-CM | POA: Diagnosis not present

## 2023-11-13 DIAGNOSIS — K567 Ileus, unspecified: Secondary | ICD-10-CM | POA: Diagnosis not present

## 2023-11-13 DIAGNOSIS — C9000 Multiple myeloma not having achieved remission: Secondary | ICD-10-CM | POA: Diagnosis not present

## 2023-11-13 DIAGNOSIS — K5989 Other specified functional intestinal disorders: Secondary | ICD-10-CM | POA: Diagnosis not present

## 2023-11-13 DIAGNOSIS — E871 Hypo-osmolality and hyponatremia: Secondary | ICD-10-CM | POA: Diagnosis not present

## 2023-11-13 DIAGNOSIS — R197 Diarrhea, unspecified: Secondary | ICD-10-CM | POA: Diagnosis not present

## 2023-11-13 DIAGNOSIS — R230 Cyanosis: Secondary | ICD-10-CM | POA: Diagnosis not present

## 2023-11-13 DIAGNOSIS — Z515 Encounter for palliative care: Secondary | ICD-10-CM | POA: Diagnosis not present

## 2023-11-13 DIAGNOSIS — J9690 Respiratory failure, unspecified, unspecified whether with hypoxia or hypercapnia: Secondary | ICD-10-CM | POA: Diagnosis not present

## 2023-11-13 DIAGNOSIS — S2231XD Fracture of one rib, right side, subsequent encounter for fracture with routine healing: Secondary | ICD-10-CM | POA: Diagnosis not present

## 2023-11-13 DIAGNOSIS — E785 Hyperlipidemia, unspecified: Secondary | ICD-10-CM | POA: Diagnosis not present

## 2023-11-13 DIAGNOSIS — R935 Abnormal findings on diagnostic imaging of other abdominal regions, including retroperitoneum: Secondary | ICD-10-CM | POA: Diagnosis not present

## 2023-11-13 DIAGNOSIS — K9083 Intestinal failure: Secondary | ICD-10-CM | POA: Diagnosis not present

## 2023-11-13 DIAGNOSIS — K631 Perforation of intestine (nontraumatic): Secondary | ICD-10-CM | POA: Diagnosis present

## 2023-11-13 DIAGNOSIS — N390 Urinary tract infection, site not specified: Secondary | ICD-10-CM | POA: Diagnosis not present

## 2023-11-13 DIAGNOSIS — K5730 Diverticulosis of large intestine without perforation or abscess without bleeding: Secondary | ICD-10-CM | POA: Diagnosis not present

## 2023-11-13 DIAGNOSIS — R1084 Generalized abdominal pain: Secondary | ICD-10-CM | POA: Diagnosis not present

## 2023-11-13 DIAGNOSIS — I82592 Chronic embolism and thrombosis of other specified deep vein of left lower extremity: Secondary | ICD-10-CM | POA: Diagnosis present

## 2023-11-13 DIAGNOSIS — K56609 Unspecified intestinal obstruction, unspecified as to partial versus complete obstruction: Secondary | ICD-10-CM | POA: Diagnosis not present

## 2023-11-13 DIAGNOSIS — R112 Nausea with vomiting, unspecified: Secondary | ICD-10-CM | POA: Diagnosis not present

## 2023-11-13 DIAGNOSIS — R110 Nausea: Secondary | ICD-10-CM | POA: Diagnosis not present

## 2023-11-13 DIAGNOSIS — K5669 Other partial intestinal obstruction: Secondary | ICD-10-CM | POA: Diagnosis not present

## 2023-11-13 DIAGNOSIS — K760 Fatty (change of) liver, not elsewhere classified: Secondary | ICD-10-CM | POA: Diagnosis not present

## 2023-11-13 DIAGNOSIS — G603 Idiopathic progressive neuropathy: Secondary | ICD-10-CM | POA: Diagnosis not present

## 2023-11-13 DIAGNOSIS — Z681 Body mass index (BMI) 19 or less, adult: Secondary | ICD-10-CM | POA: Diagnosis not present

## 2023-11-13 DIAGNOSIS — K5641 Fecal impaction: Secondary | ICD-10-CM | POA: Diagnosis not present

## 2023-11-13 DIAGNOSIS — I480 Paroxysmal atrial fibrillation: Secondary | ICD-10-CM | POA: Diagnosis not present

## 2023-11-13 DIAGNOSIS — X58XXXA Exposure to other specified factors, initial encounter: Secondary | ICD-10-CM | POA: Diagnosis present

## 2023-11-13 DIAGNOSIS — K3184 Gastroparesis: Secondary | ICD-10-CM | POA: Diagnosis not present

## 2023-11-13 DIAGNOSIS — N3289 Other specified disorders of bladder: Secondary | ICD-10-CM | POA: Diagnosis not present

## 2023-11-13 DIAGNOSIS — E876 Hypokalemia: Secondary | ICD-10-CM | POA: Diagnosis not present

## 2023-11-13 DIAGNOSIS — E870 Hyperosmolality and hypernatremia: Secondary | ICD-10-CM | POA: Diagnosis not present

## 2023-11-13 DIAGNOSIS — E872 Acidosis, unspecified: Secondary | ICD-10-CM | POA: Diagnosis not present

## 2023-11-13 DIAGNOSIS — Z66 Do not resuscitate: Secondary | ICD-10-CM | POA: Diagnosis not present

## 2023-11-13 DIAGNOSIS — Z4682 Encounter for fitting and adjustment of non-vascular catheter: Secondary | ICD-10-CM | POA: Diagnosis not present

## 2023-11-13 DIAGNOSIS — R64 Cachexia: Secondary | ICD-10-CM | POA: Diagnosis present

## 2023-11-13 DIAGNOSIS — K449 Diaphragmatic hernia without obstruction or gangrene: Secondary | ICD-10-CM | POA: Diagnosis not present

## 2023-11-13 DIAGNOSIS — T287XXA Corrosion of other parts of alimentary tract, initial encounter: Secondary | ICD-10-CM | POA: Diagnosis present

## 2023-11-13 DIAGNOSIS — F32A Depression, unspecified: Secondary | ICD-10-CM | POA: Diagnosis not present

## 2023-11-13 DIAGNOSIS — R140 Abdominal distension (gaseous): Secondary | ICD-10-CM | POA: Diagnosis not present

## 2023-11-13 DIAGNOSIS — N179 Acute kidney failure, unspecified: Secondary | ICD-10-CM | POA: Diagnosis not present

## 2023-11-13 DIAGNOSIS — K913 Postprocedural intestinal obstruction, unspecified as to partial versus complete: Secondary | ICD-10-CM | POA: Diagnosis not present

## 2023-11-13 DIAGNOSIS — I10 Essential (primary) hypertension: Secondary | ICD-10-CM | POA: Diagnosis not present

## 2023-11-13 DIAGNOSIS — K30 Functional dyspepsia: Secondary | ICD-10-CM | POA: Diagnosis not present

## 2023-11-13 DIAGNOSIS — M7989 Other specified soft tissue disorders: Secondary | ICD-10-CM | POA: Diagnosis not present

## 2023-11-13 DIAGNOSIS — E43 Unspecified severe protein-calorie malnutrition: Secondary | ICD-10-CM | POA: Diagnosis present

## 2023-11-13 DIAGNOSIS — I82513 Chronic embolism and thrombosis of femoral vein, bilateral: Secondary | ICD-10-CM | POA: Diagnosis present

## 2023-11-13 DIAGNOSIS — L89152 Pressure ulcer of sacral region, stage 2: Secondary | ICD-10-CM | POA: Diagnosis present

## 2023-11-13 DIAGNOSIS — R918 Other nonspecific abnormal finding of lung field: Secondary | ICD-10-CM | POA: Diagnosis not present

## 2023-11-13 DIAGNOSIS — Z452 Encounter for adjustment and management of vascular access device: Secondary | ICD-10-CM | POA: Diagnosis not present

## 2023-11-13 DIAGNOSIS — Z7901 Long term (current) use of anticoagulants: Secondary | ICD-10-CM | POA: Diagnosis not present

## 2023-11-13 DIAGNOSIS — R0602 Shortness of breath: Secondary | ICD-10-CM | POA: Diagnosis not present

## 2023-11-13 DIAGNOSIS — I700 Atherosclerosis of aorta: Secondary | ICD-10-CM | POA: Diagnosis not present

## 2023-11-13 DIAGNOSIS — R7881 Bacteremia: Secondary | ICD-10-CM | POA: Diagnosis not present

## 2023-11-13 DIAGNOSIS — K6389 Other specified diseases of intestine: Secondary | ICD-10-CM | POA: Diagnosis not present

## 2023-11-13 LAB — MULTIPLE MYELOMA PANEL, SERUM
Albumin SerPl Elph-Mcnc: 2.8 g/dL — ABNORMAL LOW (ref 2.9–4.4)
Albumin/Glob SerPl: 1.2 (ref 0.7–1.7)
Alpha 1: 0.2 g/dL (ref 0.0–0.4)
Alpha2 Glob SerPl Elph-Mcnc: 0.8 g/dL (ref 0.4–1.0)
B-Globulin SerPl Elph-Mcnc: 0.5 g/dL — ABNORMAL LOW (ref 0.7–1.3)
Gamma Glob SerPl Elph-Mcnc: 0.9 g/dL (ref 0.4–1.8)
Globulin, Total: 2.5 g/dL (ref 2.2–3.9)
IgA: 191 mg/dL (ref 61–437)
IgG (Immunoglobin G), Serum: 929 mg/dL (ref 603–1613)
IgM (Immunoglobulin M), Srm: 95 mg/dL (ref 15–143)
Total Protein ELP: 5.3 g/dL — ABNORMAL LOW (ref 6.0–8.5)

## 2023-11-13 LAB — URINALYSIS, W/ REFLEX TO CULTURE (INFECTION SUSPECTED)
Bacteria, UA: NONE SEEN
Bilirubin Urine: NEGATIVE
Glucose, UA: NEGATIVE mg/dL
Hgb urine dipstick: NEGATIVE
Ketones, ur: NEGATIVE mg/dL
Leukocytes,Ua: NEGATIVE
Nitrite: NEGATIVE
Protein, ur: NEGATIVE mg/dL
Specific Gravity, Urine: >1.046 — ABNORMAL HIGH (ref 1.005–1.030)
pH: 5 (ref 5.0–8.0)

## 2023-11-13 LAB — BASIC METABOLIC PANEL WITH GFR
Anion gap: 10 (ref 5–15)
BUN: 9 mg/dL (ref 8–23)
CO2: 23 mmol/L (ref 22–32)
Calcium: 7.3 mg/dL — ABNORMAL LOW (ref 8.9–10.3)
Chloride: 99 mmol/L (ref 98–111)
Creatinine, Ser: 0.72 mg/dL (ref 0.61–1.24)
GFR, Estimated: >60 mL/min (ref >60)
Glucose, Bld: 67 mg/dL — ABNORMAL LOW (ref 70–99)
Potassium: 3.5 mmol/L (ref 3.5–5.1)
Sodium: 132 mmol/L — ABNORMAL LOW (ref 135–145)

## 2023-11-13 LAB — CBC
HCT: 36.9 % — ABNORMAL LOW (ref 39.0–52.0)
Hemoglobin: 11.9 g/dL — ABNORMAL LOW (ref 13.0–17.0)
MCH: 30.3 pg (ref 26.0–34.0)
MCHC: 32.2 g/dL (ref 30.0–36.0)
MCV: 93.9 fL (ref 80.0–100.0)
Platelets: 212 K/uL (ref 150–400)
RBC: 3.93 MIL/uL — ABNORMAL LOW (ref 4.22–5.81)
RDW: 16.5 % — ABNORMAL HIGH (ref 11.5–15.5)
WBC: 13.1 K/uL — ABNORMAL HIGH (ref 4.0–10.5)
nRBC: 0 % (ref 0.0–0.2)

## 2023-11-13 LAB — MAGNESIUM: Magnesium: 1.3 mg/dL — ABNORMAL LOW (ref 1.7–2.4)

## 2023-11-13 LAB — PROTIME-INR
INR: 1.4 — ABNORMAL HIGH (ref 0.8–1.2)
Prothrombin Time: 17.6 s — ABNORMAL HIGH (ref 11.4–15.2)

## 2023-11-13 LAB — PHOSPHORUS: Phosphorus: 2.5 mg/dL (ref 2.5–4.6)

## 2023-11-13 MED ORDER — MELATONIN 5 MG PO TABS
5.0000 mg | ORAL_TABLET | Freq: Every evening | ORAL | Status: DC | PRN
  Administered 2023-11-13 – 2023-11-17 (×10): 5 mg via ORAL
  Filled 2023-11-13 (×6): qty 1

## 2023-11-13 MED ORDER — CHLORHEXIDINE GLUCONATE CLOTH 2 % EX PADS
6.0000 | MEDICATED_PAD | Freq: Every day | CUTANEOUS | Status: DC
  Administered 2023-11-13 – 2023-11-29 (×18): 6 via TOPICAL

## 2023-11-13 MED ORDER — GABAPENTIN 300 MG PO CAPS
600.0000 mg | ORAL_CAPSULE | Freq: Two times a day (BID) | ORAL | Status: DC
  Administered 2023-11-13 – 2023-11-18 (×17): 600 mg via ORAL
  Filled 2023-11-13 (×12): qty 2

## 2023-11-13 MED ORDER — SODIUM CHLORIDE 0.9 % IV SOLN: INTRAVENOUS | Status: DC

## 2023-11-13 MED ORDER — ACYCLOVIR 400 MG PO TABS
400.0000 mg | ORAL_TABLET | Freq: Two times a day (BID) | ORAL | Status: DC
  Administered 2023-11-13 – 2023-11-18 (×17): 400 mg via ORAL
  Filled 2023-11-13 (×12): qty 1

## 2023-11-13 MED ORDER — DIPHENOXYLATE-ATROPINE 2.5-0.025 MG PO TABS: 1.0000 | ORAL_TABLET | Freq: Four times a day (QID) | ORAL | Status: DC | PRN

## 2023-11-13 MED ORDER — VITAMIN B-12 1000 MCG PO TABS
1000.0000 ug | ORAL_TABLET | Freq: Every day | ORAL | Status: DC
  Administered 2023-11-13 – 2023-11-17 (×8): 1000 ug via ORAL
  Filled 2023-11-13 (×6): qty 1

## 2023-11-13 MED ORDER — ENOXAPARIN SODIUM 40 MG/0.4ML IJ SOSY: 40.0000 mg | PREFILLED_SYRINGE | INTRAMUSCULAR | Status: DC

## 2023-11-13 MED ORDER — EZETIMIBE 10 MG PO TABS
10.0000 mg | ORAL_TABLET | Freq: Every evening | ORAL | Status: DC
  Administered 2023-11-13 – 2023-11-18 (×9): 10 mg via ORAL
  Filled 2023-11-13 (×6): qty 1

## 2023-11-13 MED ORDER — APIXABAN 5 MG PO TABS
5.0000 mg | ORAL_TABLET | Freq: Two times a day (BID) | ORAL | Status: DC
  Administered 2023-11-13 – 2023-11-16 (×13): 5 mg via ORAL
  Filled 2023-11-13 (×7): qty 1

## 2023-11-13 MED ORDER — ACETAMINOPHEN 325 MG PO TABS
650.0000 mg | ORAL_TABLET | Freq: Four times a day (QID) | ORAL | Status: DC | PRN
  Administered 2023-11-17: 650 mg via ORAL
  Filled 2023-11-13 (×3): qty 2

## 2023-11-13 MED ORDER — SODIUM CHLORIDE 0.9 % IV SOLN: INTRAVENOUS | Status: AC

## 2023-11-13 MED ORDER — PROCHLORPERAZINE EDISYLATE 10 MG/2ML IJ SOLN
5.0000 mg | Freq: Four times a day (QID) | INTRAMUSCULAR | Status: DC | PRN
  Administered 2023-11-13 – 2023-12-21 (×16): 5 mg via INTRAVENOUS
  Filled 2023-11-13 (×12): qty 2

## 2023-11-13 MED ORDER — VITAMIN D 25 MCG (1000 UNIT) PO TABS
5000.0000 [IU] | ORAL_TABLET | Freq: Every day | ORAL | Status: DC
  Administered 2023-11-13 – 2023-11-16 (×7): 5000 [IU] via ORAL
  Filled 2023-11-13 (×6): qty 5

## 2023-11-13 MED ORDER — MAGNESIUM SULFATE 2 GM/50ML IV SOLN: 2.0000 g | Freq: Once | INTRAVENOUS | Status: DC

## 2023-11-13 MED ORDER — GUAIFENESIN ER 600 MG PO TB12
600.0000 mg | ORAL_TABLET | Freq: Two times a day (BID) | ORAL | Status: DC | PRN
  Administered 2023-11-17 – 2023-11-18 (×2): 600 mg via ORAL
  Filled 2023-11-13 (×2): qty 1

## 2023-11-13 MED ORDER — VITAMIN B-6 100 MG PO TABS
100.0000 mg | ORAL_TABLET | Freq: Every day | ORAL | Status: DC
  Administered 2023-11-13 – 2023-11-17 (×8): 100 mg via ORAL
  Filled 2023-11-13 (×8): qty 1

## 2023-11-13 MED ORDER — ORAL CARE MOUTH RINSE: 15.0000 mL | OROMUCOSAL | Status: DC | PRN

## 2023-11-13 MED ORDER — PANTOPRAZOLE SODIUM 40 MG PO TBEC
40.0000 mg | DELAYED_RELEASE_TABLET | Freq: Every day | ORAL | Status: DC
  Administered 2023-11-13 – 2023-11-18 (×9): 40 mg via ORAL
  Filled 2023-11-13 (×6): qty 1

## 2023-11-13 MED ORDER — IPRATROPIUM-ALBUTEROL 0.5-2.5 (3) MG/3ML IN SOLN
3.0000 mL | RESPIRATORY_TRACT | Status: AC
  Administered 2023-11-13 (×2): 3 mL via RESPIRATORY_TRACT
  Filled 2023-11-13: qty 3

## 2023-11-13 MED ORDER — OYSTER SHELL CALCIUM/D3 500-5 MG-MCG PO TABS
1.0000 | ORAL_TABLET | Freq: Two times a day (BID) | ORAL | Status: DC
  Administered 2023-11-13 – 2023-11-16 (×14): 1 via ORAL
  Filled 2023-11-13 (×10): qty 1

## 2023-11-13 MED ORDER — PRAVASTATIN SODIUM 20 MG PO TABS
80.0000 mg | ORAL_TABLET | Freq: Every day | ORAL | Status: DC
  Administered 2023-11-13 – 2023-11-18 (×8): 80 mg via ORAL
  Filled 2023-11-13 (×7): qty 4

## 2023-11-13 MED ORDER — FLUOXETINE HCL 20 MG PO CAPS
20.0000 mg | ORAL_CAPSULE | Freq: Every day | ORAL | Status: DC
  Administered 2023-11-13 – 2023-11-18 (×9): 20 mg via ORAL
  Filled 2023-11-13 (×6): qty 1

## 2023-11-13 MED ORDER — MAGNESIUM SULFATE 4 GM/100ML IV SOLN
4.0000 g | Freq: Once | INTRAVENOUS | Status: AC
  Administered 2023-11-13 (×2): 4 g via INTRAVENOUS
  Filled 2023-11-13: qty 100

## 2023-11-13 MED ORDER — ALLOPURINOL 100 MG PO TABS
300.0000 mg | ORAL_TABLET | Freq: Every day | ORAL | Status: DC
  Administered 2023-11-13 – 2023-11-18 (×9): 300 mg via ORAL
  Filled 2023-11-13 (×6): qty 1

## 2023-11-13 NOTE — Progress Notes (Signed)
 DISCONTINUE ON PATHWAY REGIMEN - Multiple Myeloma and Other Plasma Cell Dyscrasias     A cycle is every 21 days:     Lenalidomide       Dexamethasone       Bortezomib    **Always confirm dose/schedule in your pharmacy ordering system**  PRIOR TREATMENT: [id number]: VRd (Bortezomib  1.3 mg/m2 SUBQ D1, 8, 15 + Lenalidomide  25 mg + Dexamethasone  40 mg) q21 Days x 8 Cycles  START ON PATHWAY REGIMEN - Multiple Myeloma and Other Plasma Cell Dyscrasias     Cycles 1 and 2: A cycle is every 28 days:     Lenalidomide       Dexamethasone       Daratumumab and hyaluronidase-fihj    Cycles 3 through 6: A cycle is every 28 days:     Lenalidomide       Dexamethasone       Daratumumab and hyaluronidase-fihj    Cycles 7 and beyond: A cycle is every 28 days:     Lenalidomide       Dexamethasone       Daratumumab and hyaluronidase-fihj   **Always confirm dose/schedule in your pharmacy ordering system**  Patient Characteristics: Multiple Myeloma, Newly Diagnosed, Transplant Ineligible or Deferred, Standard Risk Disease Classification: Multiple Myeloma Therapeutic Status: Newly Diagnosed R2-ISS Staging: II Is Patient Eligible for Transplant<= Transplant Ineligible or Deferred Risk Status: Standard Risk Intent of Therapy: Curative Intent, Discussed with Patient

## 2023-11-13 NOTE — Evaluation (Signed)
 Physical Therapy Evaluation Patient Details Name: David Mcgrath MRN: [id number] DOB: 1950/06/08 Today's Date: 11/13/2023  History of Present Illness  David Mcgrath is a 73 y.o. male with  who presents to the ER with complaints of hypotension with SBP in the 70s and DBP in the 50s at home today, and loose stools.  Associated with cramping abdominal pain and poor oral intake for the past few days. Subjective hypotension, abdominal pain, and loose stools EFY:ybezmuzwdpnw, hyperlipidemia, paroxysmal A-fib on Eliquis , chronic anxiety/depression, polyneuropathy, multiple myeloma (diagnosed March 2025), recently admitted, from 7/31 through 11/05/2023, due to intractable nausea and vomiting from recurrent small bowel obstruction, had a diagnostic laparoscopy   Clinical Impression  Pt is a 73 y.o. male with above HPI resulting in the deficits listed below (see PT Problem List). Pt is typically independent with ADLs and mobility- recent hospitalizations- has been recently using RW  and had HHPT/OT. Pt performed bd mobility with MIN-MOD A and sit to stand transfers with CGA for safety. Pt limited with mobility this session ambulating ~70ft with CGA and RW and experienced onset of dizziness requiring assist for seated rest. Anticipate that pt will progress with mobility in absence of dizziness/lightheadedness. Pt will benefit from skilled PT to maximize functional mobility to increase independence. Recommend home with family assist and resumption of HHPT services.          If plan is discharge home, recommend the following: Assistance with cooking/housework;Help with stairs or ramp for entrance;A little help with walking and/or transfers;A little help with bathing/dressing/bathroom   Can travel by private vehicle        Equipment Recommendations None recommended by PT (Pt has recommended DME)  Recommendations for Other Services       Functional Status Assessment Patient has had a recent  decline in their functional status and demonstrates the ability to make significant improvements in function in a reasonable and predictable amount of time.     Precautions / Restrictions Precautions Precautions: Fall Restrictions Weight Bearing Restrictions Per Provider Order: No      Mobility  Bed Mobility Overal bed mobility: Needs Assistance Bed Mobility: Supine to Sit, Sit to Supine     Supine to sit: Min assist Sit to supine: Mod assist   General bed mobility comments: HOB lowered. Reported lightheadedness EOB, increased time sitting for improvement of symptoms. agreeable to attempt to transfer to recliner. Vitals EOB 126/104 (112), 86bpm. EOS pt able to assist with scooting to [facility] with use of B UEs/LES and MIN A. Assist for B LEs back onto bed.    Transfers Overall transfer level: Needs assistance Equipment used: Rolling walker (2 wheels) Transfers: Sit to/from Stand Sit to Stand: Contact guard assist           General transfer comment: stand from slightly elevated bed height to simulate home environment. Goal of transfer to recliner, pt ambulated ~73ft prior to stating that he needs to sit down, RN present in room. bed moved and pt assisted to sitting at foot of bed. Reported dizziness and lightheadedness. States has been happening at home, but typically goes away. Vitals 141/128 (134), 59bpm. 120/55 (74), 64bpm. Pt reported resolution of symptoms once back in bed.    Ambulation/Gait Ambulation/Gait assistance: Contact guard assist Gait Distance (Feet): 6 Feet Assistive device: Rolling walker (2 wheels) Gait Pattern/deviations: Trunk flexed, Step-through pattern, Decreased stride length, Decreased dorsiflexion - right, Decreased dorsiflexion - left, Shuffle, Wide base of support Gait velocity: decreased  General Gait Details: cues for proper distance from RW, upright posture. Pt with onset of dizziness requiring assist to seated back to EOB. Unable to complete  ambulation to recliner 2/2 dizziness.  Stairs            Wheelchair Mobility     Tilt Bed    Modified Rankin (Stroke Patients Only)       Balance Overall balance assessment: Needs assistance Sitting-balance support: Feet supported, No upper extremity supported Sitting balance-Leahy Scale: Good     Standing balance support: During functional activity, Reliant on assistive device for balance, Bilateral upper extremity supported Standing balance-Leahy Scale: Poor Standing balance comment: reliant on at least single UE support                             Pertinent Vitals/Pain Pain Assessment Pain Assessment: No/denies pain    Home Living Family/patient expects to be discharged to:: Private residence Living Arrangements: Spouse/significant other Available Help at Discharge: Family;Friend(s);Neighbor (friends and neighbor PRN) Type of Home: House Home Access: Ramped entrance       Home Layout: One level Home Equipment: Pharmacist, hospital (2 wheels);Cane - single point;Wheelchair - manual;Grab bars - toilet;Grab bars - tub/shower Additional Comments: Been using RW since recent d/c. Was getting HHPT/OT.    Prior Function Prior Level of Function : Independent/Modified Independent             Mobility Comments: uses RW d/t neuropathy progression ADLs Comments: Mod I with ADLs, uses shower chair. Been doing sponge baths recently. Has assist with shower transfers for safety.     Extremity/Trunk Assessment   Upper Extremity Assessment Upper Extremity Assessment: Defer to OT evaluation    Lower Extremity Assessment Lower Extremity Assessment: RLE deficits/detail RLE Deficits / Details: edematous >L LE. decreased DF bilaterally. history of peripheral neuropathy. LLE Deficits / Details: history of peripheral neuropathy.       Communication   Communication Communication: No apparent difficulties    Cognition Arousal: Alert Behavior  During Therapy: WFL for tasks assessed/performed   PT - Cognitive impairments: No apparent impairments                         Following commands: Intact       Cueing Cueing Techniques: Verbal cues     General Comments      Exercises     Assessment/Plan    PT Assessment Patient needs continued PT services  PT Problem List Decreased strength;Decreased range of motion;Decreased activity tolerance;Decreased balance;Decreased coordination;Impaired sensation       PT Treatment Interventions DME instruction;Gait training;Functional mobility training;Therapeutic activities;Therapeutic exercise;Balance training;Neuromuscular re-education;Patient/family education    PT Goals (Current goals can be found in the Care Plan section)  Acute Rehab PT Goals Patient Stated Goal: Go home and improve strength PT Goal Formulation: With patient Time For Goal Achievement: 11/27/23 Potential to Achieve Goals: Good    Frequency Min 3X/week     Co-evaluation               AM-PAC PT 6 Clicks Mobility  Outcome Measure Help needed turning from your back to your side while in a flat bed without using bedrails?: A Little Help needed moving from lying on your back to sitting on the side of a flat bed without using bedrails?: A Little Help needed moving to and from a bed to a chair (including a wheelchair)?: A Little Help  needed standing up from a chair using your arms (e.g., wheelchair or bedside chair)?: A Little Help needed to walk in hospital room?: A Little Help needed climbing 3-5 steps with a railing? : A Lot 6 Click Score: 17    End of Session Equipment Utilized During Treatment: Gait belt Activity Tolerance: Treatment limited secondary to medical complications (Comment) (lightheaded/dizzy) Patient left: in bed;with call bell/phone within reach;with chair alarm set Nurse Communication: Mobility status PT Visit Diagnosis: Unsteadiness on feet (R26.81);Other abnormalities  of gait and mobility (R26.89);Muscle weakness (generalized) (M62.81)    Time: 8984-8943 PT Time Calculation (min) (ACUTE ONLY): 41 min   Charges:   PT Evaluation $PT Eval Low Complexity: 1 Low PT Treatments $Therapeutic Activity: 23-37 mins PT General Charges $$ ACUTE PT VISIT: 1 Visit         Tinnie BERRY PT, DPT  Acute Rehabilitation Services  Office [phone]  11/13/2023, 12:43 PM

## 2023-11-13 NOTE — Progress Notes (Signed)
 610 mls in bladder  7:45 AM EM David KANDICE Hoots, MD Slowly sit him up by the side of the bed and give him the urinal and let the tap run slow and see if he will pee on his own he wants to try and pee on his own  7:47 AM will do  7:51 AM he voided 200 mls sent UA to lab

## 2023-11-13 NOTE — Progress Notes (Signed)
 Same-day note Patient was admitted here 11/02/2023 through 11/05/2023 with intractable nausea vomiting recurrent small bowel obstruction.  He was treated conservatively with NG tube decompression and discharged home.  Last admission CT abdomen/pelvis with contrast with slight improvement in small bowel dilation when compared to prior exam but with mild transition point deep in the pelvis, no pancreatic ductal dilation, no inflammatory changes, stable free fluid within the abdomen.  General surgery was consulted and patient underwent diagnostic laparoscopy by Dr. [name] on 11/03/2023 findings of intermittently dilated small bowel with erythema of the serosa consistent with inflammation/edema, intermittent injection of the small bowel mesentery, small bowel with tendency to migrate to the left upper quadrant with long/mobile small bowel mesentery with suspicion/potential for intermittent volvulized small bowel contributing to his symptoms and findings on imaging.  Diet was slowly advanced with toleration.   This admission CT abdomen showsIncreasing small bowel dilatation and fecalization of bowel contents involving the jejunum. No definitive transition point is identified at this time but caliber change is noted in the right mid abdomen at the junction of the jejunum and ileum. The more distal ileum is unremarkable. Stable ground-glass opacities in the lung bases similar to that seen on prior exams. He had 1 episode of vomiting overnight.  Will consider NG tube if further vomiting. Patient reports he has not voided since yesterday afternoon.  Wife at bedside.  Bladder scan shows 610 cc.  He wants to sit down by the side of the bed and see if he can urinate on his own without putting in a Foley catheter.  Will attempt that if that does not work we will do an I and O. N.p.o., IV fluids, consider surgical consult.  His blood pressure remains soft 100/61 labs from this morning magnesium  1.3 potassium 3.5 renal  functions normal, white count 13 hemoglobin 11.9 platelets normal.  Replete magnesium .  Follow-up labs tomorrow.

## 2023-11-13 NOTE — Progress Notes (Addendum)
   11/13/23 1049  TOC Brief Assessment  Insurance and Status Reviewed  Patient has primary care physician Yes  Home environment has been reviewed home w/ spouse  Prior level of function: independent  Prior/Current Home Services No current home services  Social Drivers of Health Review SDOH reviewed no interventions necessary  Readmission risk has been reviewed Yes  Transition of care needs no transition of care needs at this time   Has HH w/ [facility]. Need ROC orders

## 2023-11-13 NOTE — H&P (Addendum)
 History and Physical  David Mcgrath FMW:[id number] DOB: Sep 05, 1950 DOA: 11/12/2023  Referring physician: Vicky Charleston, PA-EDP  PCP: David Skates, DO  Outpatient Specialists: Medical oncology. Patient coming from: Home.  Chief Complaint: Low blood pressure, loose stools, and generalized weakness.  HPI: David Mcgrath is a 73 y.o. male with medical history significant for hypertension, hyperlipidemia, paroxysmal A-fib on Eliquis , chronic anxiety/depression, polyneuropathy, multiple myeloma (diagnosed March 2025), recently admitted, from 7/31 through 11/05/2023, due to intractable nausea and vomiting from recurrent small bowel obstruction, had a diagnostic laparoscopy, who presents to the ER with complaints of hypotension with SBP in the 70s and DBP in the 50s at home today, and loose stools.  Associated with cramping abdominal pain and poor oral intake for the past few days.  Denies subjective fevers or chills.  Endorses his symptoms have been gradually worsening since yesterday when he took his multiple myeloma Bortezomib  injections.  In the ER, vital signs are stable.  Contrast-enhanced CT abdomen and pelvis revealed increasing small bowel dilatation and fecalization of bowel contents involving the jejunum and no definitive transition point identified.  TRH, hospitalist service, was asked to admit for symptom management.  ED Course: Temperature 98.1.  BP 122/70, pulse 78, respiratory rate 17, O2 saturation 98% on room air.  Review of Systems: Review of systems as noted in the HPI. All other systems reviewed and are negative.   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  Patient reports he does not have symptoms as of 02/25/14.     Diarrhea 06/04/2023    Essential hypertension 06/04/2023   Fatty liver 06/04/2023   Flu 06/04/2023   Hyperlipidemia 06/04/2023   Ileus (HCC) 06/04/2023   Multiple myeloma (HCC)    Neuropathy    Paroxysmal atrial fibrillation (HCC) 07/04/2023   Pneumonia 06/04/2023   Past Surgical History:  Procedure Laterality Date   IR BONE MARROW BIOPSY & ASPIRATION  07/31/2023   LAPAROSCOPY N/A 11/03/2023   Procedure: LAPAROSCOPY, DIAGNOSTIC;  Surgeon: David Mcgrath;  Location: [facility];  Service: General;  Laterality: N/A;  POSSIBLE EX LAP   NO PAST SURGERIES      Social History:  reports that he has never smoked. He has never used smokeless tobacco. He reports current alcohol  use. He reports that he does not use drugs.   Allergies  Allergen Reactions   Shrimp [Shellfish Allergy] Anaphylaxis and Swelling    Swelling throat    Reglan  [Metoclopramide ] Other (See Comments)    caused tardive dyskinesia    Family History  Problem Relation Age of Onset   Diabetes Mother    Cancer Mother    Heart disease Mother    Cancer Father    Hyperlipidemia Father    Neuropathy Paternal Uncle    Migraines Neg Hx       Prior to Admission medications   Medication Sig Start Date End Date Taking? Authorizing Provider  acyclovir  (ZOVIRAX ) 400 MG tablet Take 1 tablet (400 mg total) by mouth 2 (two) times daily. 08/08/23  Yes David Norleen ONEIDA MADISON, Mcgrath  allopurinol  (ZYLOPRIM ) 300 MG tablet Take 300 mg by mouth daily.   Yes Provider, Historical, Mcgrath  apixaban  (ELIQUIS ) 5 MG TABS tablet Take 1 tablet (5 mg total) by mouth 2 (two)  times daily. 07/04/23  Yes Revankar, David SAUNDERS, Mcgrath  calcium -vitamin D  (OSCAL WITH D) 500-5 MG-MCG tablet Take 1 tablet by mouth 2 (two) times daily. 09/21/23  Yes David Norleen ONEIDA MADISON, Mcgrath  Cholecalciferol  (VITAMIN D -3) 125 MCG (5000 UT) TABS Take 5,000 Units by mouth daily.   Yes Provider, Historical, Mcgrath  Cyanocobalamin  (VITAMIN B12) 1000 MCG TBCR Take 1,000 mcg by mouth daily.   Yes Provider, Historical, Mcgrath   dexamethasone  (DECADRON ) 4 MG tablet Take 40 mg by mouth once a week on day of Velcade  injection. (10 tablets) 09/07/23  Yes Mcgrath, David T Mcgrath, Mcgrath  diphenoxylate -atropine  (LOMOTIL ) 2.5-0.025 MG tablet Take 1 tablet by mouth 4 (four) times daily as needed for diarrhea or loose stools. 08/23/23  Yes Mcgrath, David T, PA-C  ezetimibe  (ZETIA ) 10 MG tablet Take 10 mg by mouth every evening. 06/01/19  Yes Provider, Historical, Mcgrath  FLUoxetine  (PROZAC ) 20 MG capsule Take 20 mg by mouth daily.   Yes Provider, Historical, Mcgrath  fluticasone  (FLONASE ) 50 MCG/ACT nasal spray Place 1 spray into both nostrils daily as needed for allergies or rhinitis.   Yes Provider, Historical, Mcgrath  gabapentin  (NEURONTIN ) 300 MG capsule Take 2 capsules (600 mg total) by mouth 2 (two) times daily. 06/15/23  Yes David David LABOR, Mcgrath  guaiFENesin  (MUCINEX ) 600 MG 12 hr tablet Take 600 mg by mouth 2 (two) times daily as needed for cough or to loosen phlegm.   Yes Provider, Historical, Mcgrath  melatonin 5 MG TABS Take 5 mg by mouth at bedtime.   Yes Provider, Historical, Mcgrath  metoprolol  tartrate (LOPRESSOR ) 25 MG tablet Take 0.5 tablets (12.5 mg total) by mouth daily as needed (For persistent palpitation). 10/20/23  Yes Mcgrath, Chapman, Mcgrath  omeprazole (PRILOSEC) 40 MG capsule Take 40 mg by mouth daily.   Yes Provider, Historical, Mcgrath  ondansetron  (ZOFRAN ) 8 MG tablet Take 1 tablet (8 mg total) by mouth every 8 (eight) hours as needed for nausea or vomiting. 08/08/23  Yes David Norleen ONEIDA MADISON, Mcgrath  potassium chloride  SA (KLOR-CON  M) 20 MEQ tablet Take 1 tablet (20 mEq total) by mouth 2 (two) times daily. Patient taking differently: Take 20 mEq by mouth daily. 09/28/23  Yes David Norleen ONEIDA MADISON, Mcgrath  pravastatin  (PRAVACHOL ) 80 MG tablet Take 80 mg by mouth at bedtime.   Yes Provider, Historical, Mcgrath  prochlorperazine  (COMPAZINE ) 10 MG tablet Take 1 tablet (10 mg total) by mouth every 6 (six) hours as needed for nausea or vomiting. 08/08/23  Yes David Norleen ONEIDA MADISON, Mcgrath   pyridoxine  (B-6) 100 MG tablet Take 100 mg by mouth daily.   Yes Provider, Historical, Mcgrath    Physical Exam: BP 122/70 (BP Location: Left Arm)   Pulse 78   Temp 98.1 F (36.7 C)   Resp 17   Ht 6' 4 (1.93 m)   Wt 92.7 kg   SpO2 98%   BMI 24.88 kg/m   General: 73 y.o. year-old male well developed well nourished in no acute distress.  Alert and oriented x3. Cardiovascular: Regular rate and rhythm with no rubs or gallops.  No thyromegaly or JVD noted.  Edema in lower extremities bilaterally. Respiratory: Clear to auscultation with no wheezes or rales. Good inspiratory effort. Abdomen: Soft nontender nondistended with bowel sounds present. Muskuloskeletal: No cyanosis or clubbing noted bilaterally Neuro: CN II-XII intact, strength, sensation, reflexes Skin: No ulcerative lesions noted or rashes Psychiatry: Judgement and insight appear normal. Mood is appropriate for condition and setting  Labs on Admission:  Basic Metabolic Panel: Recent Labs  Lab 11/09/23 1512 11/12/23 2244  NA 132* 132*  K 3.8 3.5  CL 100 98  CO2 25 22  GLUCOSE 106* 80  BUN 9 9  CREATININE 0.72 0.87  CALCIUM  7.7* 7.5*  MG 1.3*  --    Liver Function Tests: Recent Labs  Lab 11/09/23 1512 11/12/23 2244  AST 36 32  ALT 32 33  ALKPHOS 64 50  BILITOT 0.5 0.5  PROT 5.5* 5.7*  ALBUMIN  3.0* 2.6*   No results for input(s): LIPASE, AMYLASE in the last 168 hours. No results for input(s): AMMONIA in the last 168 hours. CBC: Recent Labs  Lab 11/09/23 1512 11/12/23 2244  WBC 9.1 10.0  NEUTROABS 8.6* 8.2*  HGB 11.7* 12.6*  HCT 34.7* 38.4*  MCV 91.8 94.1  PLT 290 218   Cardiac Enzymes: No results for input(s): CKTOTAL, CKMB, CKMBINDEX, TROPONINI in the last 168 hours.  BNP (last 3 results) No results for input(s): BNP in the last 8760 hours.  ProBNP (last 3 results) No results for input(s): PROBNP in the last 8760 hours.  CBG: No results for input(s): GLUCAP in  the last 168 hours.  Radiological Exams on Admission: CT ABDOMEN PELVIS W CONTRAST Result Date: 11/13/2023 CLINICAL DATA:  Abdominal pain EXAM: CT ABDOMEN AND PELVIS WITH CONTRAST TECHNIQUE: Multidetector CT imaging of the abdomen and pelvis was performed using the standard protocol following bolus administration of intravenous contrast. RADIATION DOSE REDUCTION: This exam was performed according to the departmental dose-optimization program which includes automated exposure control, adjustment of the mA and/or kV according to patient size and/or use of iterative reconstruction technique. CONTRAST:  OMNIPAQUE  IOHEXOL  300 MG/ML  SOLN COMPARISON:  11/03/2023 FINDINGS: Lower chest: Persistent ground-glass opacities are noted in the bases bilaterally. The overall appearance is similar to that seen on the prior exam. No sizable effusion is seen. Hepatobiliary: No focal liver abnormality is seen. No gallstones, gallbladder wall thickening, or biliary dilatation. Decrease in the degree of perihepatic fluid is noted. Pancreas: Unremarkable. No pancreatic ductal dilatation or surrounding inflammatory changes. Spleen: Normal in size without focal abnormality. Adrenals/Urinary Tract: Adrenal glands are within normal limits. Kidneys demonstrate a normal enhancement pattern bilaterally. No renal calculi or obstructive changes are seen. The bladder is well distended. Stomach/Bowel: No obstructive or inflammatory changes of the colon. Stomach is well distended with ingested fluid. Dilatation of the duodenum and jejunum is again identified increased when compared with the exam. Mild fecalization of bowel contents is noted within the right mid abdomen. True transition point is not well appreciated although appears to lie in the right mid abdomen. More distal small bowel appears within normal limits. Vascular/Lymphatic: Aortic atherosclerosis. No enlarged abdominal or pelvic lymph nodes. Reproductive: Prostate is  unremarkable. Other: Mild free fluid is again seen in the pelvis stable from the prior exam. Musculoskeletal: No acute or significant osseous findings. IMPRESSION: Increasing small bowel dilatation and fecalization of bowel contents involving the jejunum. No definitive transition point is identified at this time but caliber change is noted in the right mid abdomen at the junction of the jejunum and ileum. The more distal ileum is unremarkable. Stable ground-glass opacities in the lung bases similar to that seen on prior exams. Electronically Signed   By: Oneil Devonshire M.D.   On: 11/13/2023 00:30   DG Chest Port 1 View Result Date: 11/12/2023 CLINICAL DATA:  Possible sepsis, weakness and diarrhea EXAM: PORTABLE CHEST 1 VIEW COMPARISON:  10/14/2023  FINDINGS: Cardiac shadow is stable. The lungs are well aerated bilaterally. No focal infiltrate or effusion is noted. Healing rib fracture on the right is noted posteriorly involving the sixth rib. IMPRESSION: Healing fracture of the right sixth rib. No other focal abnormality is noted. Electronically Signed   By: Oneil Devonshire M.D.   On: 11/12/2023 23:14    EKG: I independently viewed the EKG done and my findings are as followed: Ectopic atrial rhythm rate of 70.  Nonspecific ST changes.  QTc 418.  Assessment/Plan Present on Admission:  Abdominal pain  Principal Problem:   Abdominal pain  Subjective hypotension, abdominal pain, and loose stools Per wife at bedside SBP in the 70s and DBP in the 50s at home prior to calling EMS. Continue maintenance Mcgrath fluid Continue supportive care As needed analgesics As needed antiemetics Closely monitor vital signs and maintain MAP greater than 65.  Multiple myeloma, on Bortezomid  David. Federico, hematology oncology consulted Management per medical oncology  Generalized weakness PT OT evaluation Fall precautions  Paroxysmal A-fib on Eliquis  Resume home Eliquis  Rate controlled  Chronic bilateral lower  extremity edema Elevate lower extremities Monitor volume status while on gentle Mcgrath fluid hydration NS at 75 cc/h x 12 hours.  Polyneuropathy Resume home regimen  Hyperlipidemia Resume home regimen  Chronic anxiety/depression Resume home regimen.   Time: 75 minutes.   DVT prophylaxis: Home Eliquis   Code Status: Full code  Family Communication: The patient's wife at bedside.  Disposition Plan: Admitted to Telemetry unit.  Consults called: Medical oncology David. Federico consulted via secure chat and added to treatment team.  Admission status: Observation status.   Status is: Observation    Terry LOISE Hurst Mcgrath [facility] Pager [phone]  If 7PM-7AM, please contact night-coverage [email or web address] Password [facility]  11/13/2023, 2:11 AM

## 2023-11-14 DIAGNOSIS — R1084 Generalized abdominal pain: Secondary | ICD-10-CM | POA: Diagnosis not present

## 2023-11-14 LAB — CBC
HCT: 30.4 % — ABNORMAL LOW (ref 39.0–52.0)
Hemoglobin: 10.3 g/dL — ABNORMAL LOW (ref 13.0–17.0)
MCH: 32.1 pg (ref 26.0–34.0)
MCHC: 33.9 g/dL (ref 30.0–36.0)
MCV: 94.7 fL (ref 80.0–100.0)
Platelets: 158 K/uL (ref 150–400)
RBC: 3.21 MIL/uL — ABNORMAL LOW (ref 4.22–5.81)
RDW: 16.9 % — ABNORMAL HIGH (ref 11.5–15.5)
WBC: 13.5 K/uL — ABNORMAL HIGH (ref 4.0–10.5)
nRBC: 0 % (ref 0.0–0.2)

## 2023-11-14 LAB — COMPREHENSIVE METABOLIC PANEL WITH GFR
ALT: 24 U/L (ref 0–44)
AST: 24 U/L (ref 15–41)
Albumin: 2 g/dL — ABNORMAL LOW (ref 3.5–5.0)
Alkaline Phosphatase: 45 U/L (ref 38–126)
Anion gap: 8 (ref 5–15)
BUN: 10 mg/dL (ref 8–23)
CO2: 23 mmol/L (ref 22–32)
Calcium: 6.8 mg/dL — ABNORMAL LOW (ref 8.9–10.3)
Chloride: 102 mmol/L (ref 98–111)
Creatinine, Ser: 0.74 mg/dL (ref 0.61–1.24)
GFR, Estimated: >60 mL/min (ref >60)
Glucose, Bld: 71 mg/dL (ref 70–99)
Potassium: 3.3 mmol/L — ABNORMAL LOW (ref 3.5–5.1)
Sodium: 133 mmol/L — ABNORMAL LOW (ref 135–145)
Total Bilirubin: 0.5 mg/dL (ref 0.0–1.2)
Total Protein: 4.4 g/dL — ABNORMAL LOW (ref 6.5–8.1)

## 2023-11-14 LAB — C DIFFICILE QUICK SCREEN W PCR REFLEX
C Diff antigen: POSITIVE — AB
C Diff toxin: NEGATIVE

## 2023-11-14 MED ORDER — POTASSIUM CHLORIDE CRYS ER 20 MEQ PO TBCR
40.0000 meq | EXTENDED_RELEASE_TABLET | Freq: Once | ORAL | Status: AC
Start: 2023-11-14 — End: 2023-11-14
  Administered 2023-11-14 (×2): 40 meq via ORAL
  Filled 2023-11-14: qty 2

## 2023-11-14 MED ORDER — ENSURE PLUS HIGH PROTEIN PO LIQD: 237.0000 mL | Freq: Two times a day (BID) | ORAL | Status: DC

## 2023-11-14 MED ORDER — CALCIUM GLUCONATE-NACL 1-0.675 GM/50ML-% IV SOLN
1.0000 g | Freq: Once | INTRAVENOUS | Status: AC
  Administered 2023-11-14 (×2): 1000 mg via INTRAVENOUS
  Filled 2023-11-14: qty 50

## 2023-11-14 NOTE — Progress Notes (Signed)
 PROGRESS NOTE    David Mcgrath  FMW:[id number] DOB: [birth year] DOA: 11/12/2023 PCP: [name], DO   Brief Narrative: 73 y.o. male with medical history significant for hypertension, hyperlipidemia, paroxysmal A-fib on Eliquis , chronic anxiety/depression, polyneuropathy, multiple myeloma (diagnosed March 2025) received Bortezomib  infusion 11/09/2023.  Patient was admitted here 11/02/2023 through 11/05/2023 with intractable nausea vomiting recurrent small bowel obstruction.  He was treated conservatively with NG tube decompression and discharged home.  Last admission CT abdomen/pelvis with contrast with slight improvement in small bowel dilation when compared to prior exam but with mild transition point deep in the pelvis, no pancreatic ductal dilation, no inflammatory changes, stable free fluid within the abdomen.  General surgery was consulted and patient underwent diagnostic laparoscopy by Dr. [name] on 11/03/2023 findings of intermittently dilated small bowel with erythema of the serosa consistent with inflammation/edema, intermittent injection of the small bowel mesentery, small bowel with tendency to migrate to the left upper quadrant with long/mobile small bowel mesentery with suspicion/potential for intermittent volvulized small bowel contributing to his symptoms and findings on imaging.  Diet was slowly advanced with toleration.    This admission CT abdomen showsIncreasing small bowel dilatation and fecalization of bowel contents involving the jejunum. No definitive transition point is identified at this time but caliber change is noted in the right mid abdomen at the junction of the jejunum and ileum. The more distal ileum is unremarkable. Stable ground-glass opacities in the lung bases similar to that seen on prior exams.  He is admitted with generalized weakness poor p.o. intake lack of appetite and diarrhea nausea and vomiting.  Assessment & Plan:   Principal Problem:    Abdominal pain  #1 ileus versus partial SBO by CT scan.  Patient was admitted with nausea vomiting and diarrhea with abdominal pain generalized weakness poor p.o. intake and poor appetite.  Wife reported his blood pressure was soft 70s to 50s at home.  He was treated with IV fluids and supportive measures.  Nausea vomiting is resolved his appetite is back and he is requesting for food he has been started on a soft diet.  Surgery was not consulted during this admission since he improved rapidly. He also had acute urinary retention for which a Foley catheter was placed. He received his bortezomib  infusion on 11/09/2023 for multiple myeloma.  He was admitted to the hospital 3 days after the infusion.  He and the family does not think that he can handle this as because of how weak it made him feel.  Dr. Federico is planning on changing his therapy and possibly starting it next week. Nausea vomiting diarrhea and lower extremity edema likely a complication of his recent chemo treatment. Stool pathogen panel stool C. difficile pending  #2 hypotension resolved with IV fluids.  This was likely due to poor p.o. intake diarrhea and vomiting.  #3 multiple myeloma on bortezomib  Dr. Federico aware of patient's admission.  #4 generalized weakness patient seen by PT OT and recommended SNF.     #5 paroxysmal A-fib on Eliquis  Resume home Eliquis  Rate controlled   #6 chronic bilateral lower extremity edema ted hose   # 7 Polyneuropathy Resume home regimen continue gabapentin    8 Hyperlipidemia Current pravastatin  and Zetia   9 Chronic anxiety/depression Continue Prozac   10 acute urinary retention Foley placed on 11/13/2023 likely has BPH although not documented  Estimated body mass index is 24.88 kg/m as calculated from the following:   Height as of this encounter: 6' 4 (1.93 m).  Weight as of this encounter: 92.7 kg.  DVT prophylaxis: Eliquis  Code Status: Full code Family Communication: Wife at bedside   disposition Plan:  Status is: Inpatient Remains inpatient appropriate because: Acute illness   Consultants:   oncology  Procedures: None  Antimicrobials: None  Subjective: Looks and feels better than yesterday has Foley in place now for acute urinary retention continues to have some diarrhea  Objective: Vitals:   11/13/23 1500 11/13/23 1600 11/13/23 2123 11/14/23 0541  BP:   113/74 123/62  Pulse:   73 81  Resp: 16 14 18 17   Temp:   97.8 F (36.6 C) 97.8 F (36.6 C)  TempSrc:   Oral Oral  SpO2:   98% 98%  Weight:      Height:        Intake/Output Summary (Last 24 hours) at 11/14/2023 1211 Last data filed at 11/14/2023 1000 Gross per 24 hour  Intake 922.49 ml  Output 1750 ml  Net -827.51 ml   Filed Weights   11/12/23 2211 11/13/23 0136  Weight: 83.5 kg 92.7 kg    Examination:  General exam: Appears in nad Respiratory system: Clear to auscultation. Respiratory effort normal. Cardiovascular system: S1 & S2 heard, RRR. No JVD, murmurs, rubs, gallops or clicks. No pedal edema. Gastrointestinal system: Abdomen is nondistended, soft and nontender. No organomegaly or masses felt. Normal bowel sounds heard. Central nervous system: Alert and oriented. No focal neurological deficits. Extremities: 3 plus edema    Data Reviewed: I have personally reviewed following labs and imaging studies  CBC: Recent Labs  Lab 11/09/23 1512 11/12/23 2244 11/13/23 0543 11/14/23 0514  WBC 9.1 10.0 13.1* 13.5*  NEUTROABS 8.6* 8.2*  --   --   HGB 11.7* 12.6* 11.9* 10.3*  HCT 34.7* 38.4* 36.9* 30.4*  MCV 91.8 94.1 93.9 94.7  PLT 290 218 212 158   Basic Metabolic Panel: Recent Labs  Lab 11/09/23 1512 11/12/23 2244 11/13/23 0543 11/14/23 0514  NA 132* 132* 132* 133*  K 3.8 3.5 3.5 3.3*  CL 100 98 99 102  CO2 25 22 23 23   GLUCOSE 106* 80 67* 71  BUN 9 9 9 10   CREATININE 0.72 0.87 0.72 0.74  CALCIUM  7.7* 7.5* 7.3* 6.8*  MG 1.3*  --  1.3*  --   PHOS  --   --  2.5  --     GFR: Estimated Creatinine Clearance: 102.5 mL/min (by C-G formula based on SCr of 0.74 mg/dL). Liver Function Tests: Recent Labs  Lab 11/09/23 1512 11/12/23 2244 11/14/23 0514  AST 36 32 24  ALT 32 33 24  ALKPHOS 64 50 45  BILITOT 0.5 0.5 0.5  PROT 5.5* 5.7* 4.4*  ALBUMIN  3.0* 2.6* 2.0*   No results for input(s): LIPASE, AMYLASE in the last 168 hours. No results for input(s): AMMONIA in the last 168 hours. Coagulation Profile: Recent Labs  Lab 11/13/23 0543  INR 1.4*   Cardiac Enzymes: No results for input(s): CKTOTAL, CKMB, CKMBINDEX, TROPONINI in the last 168 hours. BNP (last 3 results) No results for input(s): PROBNP in the last 8760 hours. HbA1C: No results for input(s): HGBA1C in the last 72 hours. CBG: No results for input(s): GLUCAP in the last 168 hours. Lipid Profile: No results for input(s): CHOL, HDL, LDLCALC, TRIG, CHOLHDL, LDLDIRECT in the last 72 hours. Thyroid  Function Tests: No results for input(s): TSH, T4TOTAL, FREET4, T3FREE, THYROIDAB in the last 72 hours. Anemia Panel: No results for input(s): VITAMINB12, FOLATE, FERRITIN, TIBC, IRON, RETICCTPCT in  the last 72 hours. Sepsis Labs: Recent Labs  Lab 11/12/23 2309  LATICACIDVEN 1.4    Recent Results (from the past 240 hours)  Blood Culture (routine x 2)     Status: None (Preliminary result)   Collection Time: 11/12/23 10:42 PM   Specimen: BLOOD RIGHT FOREARM  Result Value Ref Range Status   Specimen Description   Final    BLOOD RIGHT FOREARM Performed at [facility], [address], [place], [place] [address]    Special Requests   Final    BOTTLES DRAWN AEROBIC AND ANAEROBIC Blood Culture adequate volume Performed at [facility], [address], [place], [place] [address]    Culture   Final    NO GROWTH 1 DAY Performed at [facility], [address] [place], [place] [address]    Report Status  PENDING  Incomplete  Blood Culture (routine x 2)     Status: None (Preliminary result)   Collection Time: 11/12/23 10:42 PM   Specimen: BLOOD  Result Value Ref Range Status   Specimen Description   Final    BLOOD BLOOD RIGHT HAND Performed at [facility], [address] [place], [place] [address]    Special Requests   Final    BOTTLES DRAWN AEROBIC AND ANAEROBIC Blood Culture adequate volume Performed at [facility], [address], [place], [place] [address]    Culture   Final    NO GROWTH 1 DAY Performed at [facility], [address], [place], [place] [address]    Report Status PENDING  Incomplete         Radiology Studies: CT ABDOMEN PELVIS W CONTRAST Result Date: 11/13/2023 CLINICAL DATA:  Abdominal pain EXAM: CT ABDOMEN AND PELVIS WITH CONTRAST TECHNIQUE: Multidetector CT imaging of the abdomen and pelvis was performed using the standard protocol following bolus administration of intravenous contrast. RADIATION DOSE REDUCTION: This exam was performed according to the departmental dose-optimization program which includes automated exposure control, adjustment of the mA and/or kV according to patient size and/or use of iterative reconstruction technique. CONTRAST:  OMNIPAQUE  IOHEXOL  300 MG/ML  SOLN COMPARISON:  11/03/2023 FINDINGS: Lower chest: Persistent ground-glass opacities are noted in the bases bilaterally. The overall appearance is similar to that seen on the prior exam. No sizable effusion is seen. Hepatobiliary: No focal liver abnormality is seen. No gallstones, gallbladder wall thickening, or biliary dilatation. Decrease in the degree of perihepatic fluid is noted. Pancreas: Unremarkable. No pancreatic ductal dilatation or surrounding inflammatory changes. Spleen: Normal in size without focal abnormality. Adrenals/Urinary Tract: Adrenal glands are within normal limits. Kidneys demonstrate a normal enhancement pattern  bilaterally. No renal calculi or obstructive changes are seen. The bladder is well distended. Stomach/Bowel: No obstructive or inflammatory changes of the colon. Stomach is well distended with ingested fluid. Dilatation of the duodenum and jejunum is again identified increased when compared with the exam. Mild fecalization of bowel contents is noted within the right mid abdomen. True transition point is not well appreciated although appears to lie in the right mid abdomen. More distal small bowel appears within normal limits. Vascular/Lymphatic: Aortic atherosclerosis. No enlarged abdominal or pelvic lymph nodes. Reproductive: Prostate is unremarkable. Other: Mild free fluid is again seen in the pelvis stable from the prior exam. Musculoskeletal: No acute or significant osseous findings. IMPRESSION: Increasing small bowel dilatation and fecalization of bowel contents involving the jejunum. No definitive transition point is identified at this time  but caliber change is noted in the right mid abdomen at the junction of the jejunum and ileum. The more distal ileum is unremarkable. Stable ground-glass opacities in the lung bases similar to that seen on prior exams. Electronically Signed   By: Oneil Devonshire M.D.   On: 11/13/2023 00:30   DG Chest Port 1 View Result Date: 11/12/2023 CLINICAL DATA:  Possible sepsis, weakness and diarrhea EXAM: PORTABLE CHEST 1 VIEW COMPARISON:  10/14/2023 FINDINGS: Cardiac shadow is stable. The lungs are well aerated bilaterally. No focal infiltrate or effusion is noted. Healing rib fracture on the right is noted posteriorly involving the sixth rib. IMPRESSION: Healing fracture of the right sixth rib. No other focal abnormality is noted. Electronically Signed   By: Oneil Devonshire M.D.   On: 11/12/2023 23:14   Scheduled Meds:  acyclovir   400 mg Oral BID   allopurinol   300 mg Oral Daily   apixaban   5 mg Oral BID   calcium -vitamin D   1 tablet Oral BID   Chlorhexidine  Gluconate Cloth  6  each Topical Daily   cholecalciferol   5,000 Units Oral Daily   cyanocobalamin   1,000 mcg Oral Daily   ezetimibe   10 mg Oral QPM   feeding supplement  237 mL Oral BID BM   FLUoxetine   20 mg Oral Daily   gabapentin   600 mg Oral BID   pantoprazole   40 mg Oral Daily   pravastatin   80 mg Oral Daily   pyridoxine   100 mg Oral Daily   Continuous Infusions:   LOS: 1 day    Almarie KANDICE Hoots, MD 11/14/2023, 12:11 PM

## 2023-11-14 NOTE — Progress Notes (Signed)
 PT Cancellation Note  Patient Details Name: David Mcgrath MRN: [id number] DOB: [birth year]   Cancelled Treatment:    Reason Eval/Treat Not Completed: Fatigue/lethargy limiting ability to participate. PT arrived 18 and pt reported just getting back into bed. Pt declined therapy today however just wants to put it off until tomorrow. PT to continue to follow acutely.   [place], PT Acute Rehab   [place] VEAR Drone 11/14/2023, 4:11 PM

## 2023-11-14 NOTE — Plan of Care (Signed)

## 2023-11-14 NOTE — Plan of Care (Signed)
  Problem: Nutrition: Goal: Adequate nutrition will be maintained Outcome: Progressing   Problem: Coping: Goal: Level of anxiety will decrease Outcome: Progressing   Problem: Elimination: Goal: Will not experience complications related to bowel motility Outcome: Progressing   Problem: Pain Managment: Goal: General experience of comfort will improve and/or be controlled Outcome: Progressing

## 2023-11-14 NOTE — Evaluation (Signed)
 Occupational Therapy Evaluation Patient Details Name: David Mcgrath MRN: [id number] DOB: Jan 29, 1951 Today's Date: 11/14/2023   History of Present Illness   David Mcgrath is a 73 y.o. male with  who presents to the ER with complaints of hypotension with SBP in the 70s and DBP in the 50s at home today, and loose stools.  Associated with cramping abdominal pain and poor oral intake for the past few days. Subjective hypotension, abdominal pain, and loose stools EFY:[id number], hyperlipidemia, paroxysmal A-fib on Eliquis , chronic anxiety/depression, polyneuropathy, multiple myeloma (diagnosed March 2025), recently admitted, from 7/31 through 11/05/2023, due to intractable nausea and vomiting from recurrent small bowel obstruction, had a diagnostic laparoscopy     Clinical Impressions PTA, patient was mod I with BADL's and mobility with AD and use of w/c intermittently with wife's support but now hospitalized recurrently with new recent decline in function. Was receiving HH services. Currently, patient presents sub-baseline functioning with deficits outlined below (see OT Problem List for details) most significantly generalized muscle weakness, decreased activity tolerance, skin integrity with LE edema, postural deficits, decreased cognition and balance limiting BADL's and functional mobility. This session had loose BM which patient was unaware, has new Foley and fatigued extremely easily with SPT to commode and then to recliner requiring + 2 for safety. Wife feels further rehab post hospitalization may be needed and OT in agreement. Secure chat to team. Patient requires continued Acute care hospital level OT services to progress safety and functional performance and allow for discharge. Patient will benefit from continued inpatient follow up therapy, <3 hours/day.        If plan is discharge home, recommend the following:   Two people to help with walking and/or transfers;Two  people to help with bathing/dressing/bathroom;Assistance with cooking/housework;Assistance with feeding;Direct supervision/assist for medications management;Direct supervision/assist for financial management;Assist for transportation;Help with stairs or ramp for entrance;Supervision due to cognitive status     Functional Status Assessment   Patient has had a recent decline in their functional status and demonstrates the ability to make significant improvements in function in a reasonable and predictable amount of time.     Equipment Recommendations   None recommended by OT      Precautions/Restrictions   Precautions Precautions: Fall Restrictions Weight Bearing Restrictions Per Provider Order: No     Mobility Bed Mobility Overal bed mobility: Needs Assistance Bed Mobility: Supine to Sit, Sit to Supine     Supine to sit: Min assist Sit to supine: Mod assist   General bed mobility comments: HOB lowered. Reported lightheadedness EOB, increased time sitting for improvement of symptoms. agreeable to attempt to transfer to recliner. Vitals EOB 126/104 (112), 86bpm. EOS pt able to assist with scooting to [facility] with use of B UEs/LES and MIN A. Assist for B LEs back onto bed.    Transfers Overall transfer level: Needs assistance Equipment used: Rolling walker (2 wheels) Transfers: Sit to/from Stand Sit to Stand: Mod assist, +2 physical assistance, +2 safety/equipment, From elevated surface           General transfer comment: unable to control deceleration, + 2 for safety as patient fatigues and lowers himself without warning      Balance Overall balance assessment: Needs assistance Sitting-balance support: Feet supported, No upper extremity supported Sitting balance-Leahy Scale: Fair     Standing balance support: During functional activity, Reliant on assistive device for balance, Bilateral upper extremity supported Standing balance-Leahy Scale: Poor Standing balance  comment: B UE support and AD  ADL either performed or assessed with clinical judgement   ADL Overall ADL's : Needs assistance/impaired Eating/Feeding: Set up;Sitting   Grooming: Wash/dry hands;Wash/dry face;Oral care;Sitting;Contact guard assist   Upper Body Bathing: Minimal assistance;Sitting   Lower Body Bathing: Maximal assistance;Sitting/lateral leans Lower Body Bathing Details (indicate cue type and reason): weakness Upper Body Dressing : Minimal assistance;Sitting   Lower Body Dressing: Total assistance;+2 for physical assistance;+2 for safety/equipment;Sit to/from stand Lower Body Dressing Details (indicate cue type and reason): managing Foley Toilet Transfer: Moderate assistance;+2 for physical assistance;+2 for safety/equipment;BSC/3in1 Toilet Transfer Details (indicate cue type and reason): fatigues so ealily Toileting- Clothing Manipulation and Hygiene: Total assistance Toileting - Clothing Manipulation Details (indicate cue type and reason): Foley and loose stools     Functional mobility during ADLs: Moderate assistance;+2 for physical assistance;+2 for safety/equipment;Rolling walker (2 wheels) General ADL Comments: proximal LE weakness, poor activity tolerance, no awareness of loose stools     Vision Baseline Vision/History: 0 No visual deficits;1 Wears glasses              Pertinent Vitals/Pain Pain Assessment Pain Assessment: No/denies pain     Extremity/Trunk Assessment Upper Extremity Assessment Upper Extremity Assessment: Right hand dominant;Generalized weakness   Lower Extremity Assessment Lower Extremity Assessment: Defer to PT evaluation       Communication Communication Communication: No apparent difficulties   Cognition Arousal: Alert Behavior During Therapy: WFL for tasks assessed/performed Cognition: Cognition impaired         Attention impairment (select first level of impairment): Sustained  attention Executive functioning impairment (select all impairments): Problem solving OT - Cognition Comments: higher level processing and mental capacity for multiple tasks                 Following commands: Intact       Cueing  General Comments   Cueing Techniques: Verbal cues  poor activity tolerance after BMs, HR 88 BP 134/74 at rest, dropped 116/68 and HR 128 post standing and toileting, + 2-3 LE edema           Home Living Family/patient expects to be discharged to:: Private residence Living Arrangements: Spouse/significant other Available Help at Discharge: Family;Friend(s);Neighbor (friends and neighbor PRN) Type of Home: House Home Access: Ramped entrance Entrance Stairs-Number of Steps: 2 Entrance Stairs-Rails: Left Home Layout: One level     Bathroom Shower/Tub: Producer, television/film/video: Standard     Home Equipment: Pharmacist, hospital (2 wheels);Cane - single point;Wheelchair - manual;Grab bars - toilet;Grab bars - tub/shower;Hand held shower head;Adaptive equipment Adaptive Equipment: Reacher Additional Comments: Been using RW since recent d/c. Was getting HHPT/OT.      Prior Functioning/Environment Prior Level of Function : Independent/Modified Independent             Mobility Comments: uses RW d/t neuropathy progression ADLs Comments: Mod I with ADLs, uses shower chair. Been doing sponge baths recently. Has assist with shower transfers for safety.    OT Problem List: Decreased strength;Decreased activity tolerance;Impaired balance (sitting and/or standing);Decreased cognition;Decreased knowledge of use of DME or AE;Decreased knowledge of precautions;Cardiopulmonary status limiting activity;Increased edema   OT Treatment/Interventions: Self-care/ADL training;Therapeutic exercise;Neuromuscular education;Energy conservation;DME and/or AE instruction;Therapeutic activities;Cognitive remediation/compensation;Balance  training;Patient/family education      OT Goals(Current goals can be found in the care plan section)   Acute Rehab OT Goals Patient Stated Goal: to be stronger 1st OT Goal Formulation: With patient/family Time For Goal Achievement: 11/28/23 Potential to Achieve Goals: Fair ADL Goals Pt Will Perform  Lower Body Bathing: with min assist;sit to/from stand Pt Will Perform Lower Body Dressing: with min assist;sit to/from stand Pt Will Transfer to Toilet: with min assist;bedside commode Pt Will Perform Toileting - Clothing Manipulation and hygiene: with min assist;sit to/from stand Pt/caregiver will Perform Home Exercise Program: Increased strength;Both right and left upper extremity;With written HEP provided;With Supervision Additional ADL Goal #1: Patient will integrate simple ECT into BADL's and mobility with min cues   OT Frequency:  Min 2X/week       AM-PAC OT 6 Clicks Daily Activity     Outcome Measure Help from another person eating meals?: A Little Help from another person taking care of personal grooming?: A Little Help from another person toileting, which includes using toliet, bedpan, or urinal?: Total Help from another person bathing (including washing, rinsing, drying)?: A Lot Help from another person to put on and taking off regular upper body clothing?: A Lot Help from another person to put on and taking off regular lower body clothing?: Total 6 Click Score: 12   End of Session Equipment Utilized During Treatment: Gait belt;Rolling walker (2 wheels) Nurse Communication: Mobility status;Other (comment) (recliner back toggle in ill repair and charge notified for replacement as well as soiled TEDs need ordering charge verbalized ok, stool doc in Flow sheet by OT)  Activity Tolerance: Patient limited by fatigue Patient left: in chair;with call bell/phone within reach;with chair alarm set;with family/visitor present  OT Visit Diagnosis: Unsteadiness on feet (R26.81);Other  abnormalities of gait and mobility (R26.89);Muscle weakness (generalized) (M62.81);Cognitive communication deficit (R41.841)                Time: 9084-8989 OT Time Calculation (min): 55 min Charges:  OT General Charges $OT Visit: 1 Visit OT Evaluation $OT Eval Moderate Complexity: 1 Mod OT Treatments $Self Care/Home Management : 23-37 mins  Raziel Koenigs OT/L Acute Rehabilitation Department  [phone]  11/14/2023, 10:31 AM

## 2023-11-15 DIAGNOSIS — R1084 Generalized abdominal pain: Secondary | ICD-10-CM | POA: Diagnosis not present

## 2023-11-15 LAB — COMPREHENSIVE METABOLIC PANEL WITH GFR
ALT: 22 U/L (ref 0–44)
AST: 20 U/L (ref 15–41)
Albumin: 2 g/dL — ABNORMAL LOW (ref 3.5–5.0)
Alkaline Phosphatase: 51 U/L (ref 38–126)
Anion gap: 9 (ref 5–15)
BUN: 11 mg/dL (ref 8–23)
CO2: 20 mmol/L — ABNORMAL LOW (ref 22–32)
Calcium: 7.2 mg/dL — ABNORMAL LOW (ref 8.9–10.3)
Chloride: 102 mmol/L (ref 98–111)
Creatinine, Ser: 0.68 mg/dL (ref 0.61–1.24)
GFR, Estimated: >60 mL/min (ref >60)
Glucose, Bld: 72 mg/dL (ref 70–99)
Potassium: 3.7 mmol/L (ref 3.5–5.1)
Sodium: 131 mmol/L — ABNORMAL LOW (ref 135–145)
Total Bilirubin: 0.8 mg/dL (ref 0.0–1.2)
Total Protein: 4.5 g/dL — ABNORMAL LOW (ref 6.5–8.1)

## 2023-11-15 LAB — GASTROINTESTINAL PANEL BY PCR, STOOL (REPLACES STOOL CULTURE)

## 2023-11-15 LAB — CLOSTRIDIUM DIFFICILE BY PCR, REFLEXED
Hypervirulent Strain: NEGATIVE
Toxigenic C. Difficile by PCR: NEGATIVE

## 2023-11-15 LAB — CBC
HCT: 33.9 % — ABNORMAL LOW (ref 39.0–52.0)
Hemoglobin: 11 g/dL — ABNORMAL LOW (ref 13.0–17.0)
MCH: 31.1 pg (ref 26.0–34.0)
MCHC: 32.4 g/dL (ref 30.0–36.0)
MCV: 95.8 fL (ref 80.0–100.0)
Platelets: 175 K/uL (ref 150–400)
RBC: 3.54 MIL/uL — ABNORMAL LOW (ref 4.22–5.81)
RDW: 17.1 % — ABNORMAL HIGH (ref 11.5–15.5)
WBC: 12.5 K/uL — ABNORMAL HIGH (ref 4.0–10.5)
nRBC: 0 % (ref 0.0–0.2)

## 2023-11-15 LAB — MAGNESIUM: Magnesium: 1.7 mg/dL (ref 1.7–2.4)

## 2023-11-15 MED ORDER — FLUTICASONE PROPIONATE 50 MCG/ACT NA SUSP
1.0000 | Freq: Every day | NASAL | Status: DC
  Administered 2023-11-15 – 2023-12-20 (×16): 1 via NASAL
  Filled 2023-11-15 (×2): qty 16

## 2023-11-15 MED ORDER — SACCHAROMYCES BOULARDII 250 MG PO CAPS
250.0000 mg | ORAL_CAPSULE | Freq: Two times a day (BID) | ORAL | Status: DC
  Administered 2023-11-15 – 2023-11-18 (×7): 250 mg via ORAL
  Filled 2023-11-15 (×7): qty 1

## 2023-11-15 MED ORDER — MAGNESIUM SULFATE 2 GM/50ML IV SOLN
2.0000 g | Freq: Once | INTRAVENOUS | Status: AC
  Administered 2023-11-15 (×2): 2 g via INTRAVENOUS
  Filled 2023-11-15: qty 50

## 2023-11-15 MED ORDER — SODIUM CHLORIDE 0.9 % IV SOLN: INTRAVENOUS | Status: DC

## 2023-11-15 NOTE — Plan of Care (Signed)

## 2023-11-15 NOTE — Progress Notes (Signed)
 PROGRESS NOTE    David Mcgrath  FMW:[id number] DOB: 10/29/1950 DOA: 11/12/2023 PCP: [name], DO   Brief Narrative: 73 y.o. male with medical history significant for hypertension, hyperlipidemia, paroxysmal A-fib on Eliquis , chronic anxiety/depression, polyneuropathy, multiple myeloma (diagnosed March 2025) received Bortezomib  infusion 11/09/2023.  Patient was admitted here 11/02/2023 through 11/05/2023 with intractable nausea vomiting recurrent small bowel obstruction.  He was treated conservatively with NG tube decompression and discharged home.  Last admission CT abdomen/pelvis with contrast with slight improvement in small bowel dilation when compared to prior exam but with mild transition point deep in the pelvis, no pancreatic ductal dilation, no inflammatory changes, stable free fluid within the abdomen.  General surgery was consulted and patient underwent diagnostic laparoscopy by Dr. [name] on 11/03/2023 findings of intermittently dilated small bowel with erythema of the serosa consistent with inflammation/edema, intermittent injection of the small bowel mesentery, small bowel with tendency to migrate to the left upper quadrant with long/mobile small bowel mesentery with suspicion/potential for intermittent volvulized small bowel contributing to his symptoms and findings on imaging.  Diet was slowly advanced with toleration.    This admission CT abdomen showsIncreasing small bowel dilatation and fecalization of bowel contents involving the jejunum. No definitive transition point is identified at this time but caliber change is noted in the right mid abdomen at the junction of the jejunum and ileum. The more distal ileum is unremarkable. Stable ground-glass opacities in the lung bases similar to that seen on prior exams.  He is admitted with generalized weakness poor p.o. intake lack of appetite and diarrhea nausea and vomiting.  Assessment & Plan:   Principal Problem:    Abdominal pain  #1 ileus versus partial SBO by CT scan.  Patient was admitted with nausea vomiting and diarrhea with abdominal pain generalized weakness poor p.o. intake and poor appetite.  Wife reported his blood pressure was soft 70s to 50s at home.  He was treated with IV fluids and supportive measures.  Nausea and vomiting resolved.  He has been started on a full liquid diet.  Advance diet as tolerated.  Surgery not consulted during this admission since he improved with conservative measures.    He also had acute urinary retention for which a Foley catheter was placed. He received his bortezomib  infusion on 11/09/2023 for multiple myeloma.  He was admitted to the hospital 3 days after the infusion.  He and the family does not think that he can handle this as because of how weak it made him feel.  Dr. Federico is planning on changing his therapy and possibly starting it next week. Nausea vomiting diarrhea and lower extremity edema likely a complication of his recent chemo treatment. C. difficile antigen positive.  GI pathogen panel negative.  #2 hypotension resolved with IV fluids.  This was likely due to poor p.o. intake diarrhea and vomiting.  #3 multiple myeloma on bortezomib  Dr. Federico aware of patient's admission.  #4 generalized weakness patient seen by PT OT and recommended SNF.     #5 paroxysmal A-fib on Eliquis  Resume home Eliquis  Rate controlled   #6 chronic bilateral lower extremity edema ted hose   # 7 Polyneuropathy Resume home regimen continue gabapentin    8 Hyperlipidemia Current pravastatin  and Zetia   9 Chronic anxiety/depression Continue Prozac   10 acute urinary retention Foley placed on 11/13/2023 likely has BPH although not  Documented  11 generalized weakness PT recommends SNF TOC consulted.  Estimated body mass index is 24.88 kg/m as calculated from  the following:   Height as of this encounter: 6' 4 (1.93 m).   Weight as of this encounter: 92.7 kg.  DVT  prophylaxis: Eliquis  Code Status: Full code Family Communication: Wife at bedside  disposition Plan:  Status is: Inpatient Remains inpatient appropriate because: Acute illness   Consultants:   oncology  Procedures: None  Antimicrobials: None  Subjective: Anxious to eat no n/v/d  Objective: Vitals:   11/14/23 1845 11/14/23 2326 11/15/23 0551 11/15/23 1413  BP: 128/76 119/66 (!) 117/55 121/63  Pulse: 78 67 83 92  Resp: 19 20 20 12   Temp: (!) 97.1 F (36.2 C) 98.1 F (36.7 C) 98 F (36.7 C) 97.8 F (36.6 C)  TempSrc:  Oral  Oral  SpO2: 100% 99% 94% 100%  Weight:      Height:        Intake/Output Summary (Last 24 hours) at 11/15/2023 1535 Last data filed at 11/15/2023 1516 Gross per 24 hour  Intake 703.34 ml  Output 800 ml  Net -96.66 ml   Filed Weights   11/12/23 2211 11/13/23 0136  Weight: 83.5 kg 92.7 kg    Examination:  General exam: Appears in nad Respiratory system: Clear to auscultation. Respiratory effort normal. Cardiovascular system: reg Gastrointestinal system: Abdomen is nondistended, soft and nontender. No organomegaly or masses felt. Normal bowel sounds heard. Central nervous system: Alert and oriented. No focal neurological deficits. Extremities: 2 plus edema    Data Reviewed: I have personally reviewed following labs and imaging studies  CBC: Recent Labs  Lab 11/09/23 1512 11/12/23 2244 11/13/23 0543 11/14/23 0514 11/15/23 0527  WBC 9.1 10.0 13.1* 13.5* 12.5*  NEUTROABS 8.6* 8.2*  --   --   --   HGB 11.7* 12.6* 11.9* 10.3* 11.0*  HCT 34.7* 38.4* 36.9* 30.4* 33.9*  MCV 91.8 94.1 93.9 94.7 95.8  PLT 290 218 212 158 175   Basic Metabolic Panel: Recent Labs  Lab 11/09/23 1512 11/12/23 2244 11/13/23 0543 11/14/23 0514 11/15/23 0527  NA 132* 132* 132* 133* 131*  K 3.8 3.5 3.5 3.3* 3.7  CL 100 98 99 102 102  CO2 25 22 23 23  20*  GLUCOSE 106* 80 67* 71 72  BUN 9 9 9 10 11   CREATININE 0.72 0.87 0.72 0.74 0.68  CALCIUM  7.7* 7.5*  7.3* 6.8* 7.2*  MG 1.3*  --  1.3*  --   --   PHOS  --   --  2.5  --   --    GFR: Estimated Creatinine Clearance: 102.5 mL/min (by C-G formula based on SCr of 0.68 mg/dL). Liver Function Tests: Recent Labs  Lab 11/09/23 1512 11/12/23 2244 11/14/23 0514 11/15/23 0527  AST 36 32 24 20  ALT 32 33 24 22  ALKPHOS 64 50 45 51  BILITOT 0.5 0.5 0.5 0.8  PROT 5.5* 5.7* 4.4* 4.5*  ALBUMIN  3.0* 2.6* 2.0* 2.0*   No results for input(s): LIPASE, AMYLASE in the last 168 hours. No results for input(s): AMMONIA in the last 168 hours. Coagulation Profile: Recent Labs  Lab 11/13/23 0543  INR 1.4*   Cardiac Enzymes: No results for input(s): CKTOTAL, CKMB, CKMBINDEX, TROPONINI in the last 168 hours. BNP (last 3 results) No results for input(s): PROBNP in the last 8760 hours. HbA1C: No results for input(s): HGBA1C in the last 72 hours. CBG: No results for input(s): GLUCAP in the last 168 hours. Lipid Profile: No results for input(s): CHOL, HDL, LDLCALC, TRIG, CHOLHDL, LDLDIRECT in the last 72 hours. Thyroid  Function  Tests: No results for input(s): TSH, T4TOTAL, FREET4, T3FREE, THYROIDAB in the last 72 hours. Anemia Panel: No results for input(s): VITAMINB12, FOLATE, FERRITIN, TIBC, IRON, RETICCTPCT in the last 72 hours. Sepsis Labs: Recent Labs  Lab 11/12/23 2309  LATICACIDVEN 1.4    Recent Results (from the past 240 hours)  Blood Culture (routine x 2)     Status: None (Preliminary result)   Collection Time: 11/12/23 10:42 PM   Specimen: BLOOD RIGHT FOREARM  Result Value Ref Range Status   Specimen Description   Final    BLOOD RIGHT FOREARM Performed at [facility], [address] [place], [place] [address]    Special Requests   Final    BOTTLES DRAWN AEROBIC AND ANAEROBIC Blood Culture adequate volume Performed at [facility], [address], [place], [place] [address]    Culture   Final    NO  GROWTH 2 DAYS Performed at [facility], [address] [place], [place] [address]    Report Status PENDING  Incomplete  Blood Culture (routine x 2)     Status: None (Preliminary result)   Collection Time: 11/12/23 10:42 PM   Specimen: BLOOD  Result Value Ref Range Status   Specimen Description   Final    BLOOD BLOOD RIGHT HAND Performed at [facility], [address] [place], [place] [address]    Special Requests   Final    BOTTLES DRAWN AEROBIC AND ANAEROBIC Blood Culture adequate volume Performed at [facility], [address], [place], [place] [address]    Culture   Final    NO GROWTH 2 DAYS Performed at [facility], [address] [place], [place] [address]    Report Status PENDING  Incomplete  Gastrointestinal Panel by PCR , Stool     Status: None   Collection Time: 11/14/23  6:42 PM   Specimen: STOOL  Result Value Ref Range Status   Campylobacter species NOT DETECTED NOT DETECTED Final   Plesimonas shigelloides NOT DETECTED NOT DETECTED Final   Salmonella species NOT DETECTED NOT DETECTED Final   Yersinia enterocolitica NOT DETECTED NOT DETECTED Final   Vibrio species NOT DETECTED NOT DETECTED Final   Vibrio cholerae NOT DETECTED NOT DETECTED Final   Enteroaggregative E coli (EAEC) NOT DETECTED NOT DETECTED Final   Enteropathogenic E coli (EPEC) NOT DETECTED NOT DETECTED Final   Enterotoxigenic E coli (ETEC) NOT DETECTED NOT DETECTED Final   Shiga like toxin producing E coli (STEC) NOT DETECTED NOT DETECTED Final   Shigella/Enteroinvasive E coli (EIEC) NOT DETECTED NOT DETECTED Final   Cryptosporidium NOT DETECTED NOT DETECTED Final   Cyclospora cayetanensis NOT DETECTED NOT DETECTED Final   Entamoeba histolytica NOT DETECTED NOT DETECTED Final   Giardia lamblia NOT DETECTED NOT DETECTED Final   Adenovirus F40/41 NOT DETECTED NOT DETECTED Final   Astrovirus NOT DETECTED NOT DETECTED Final   Norovirus GI/GII NOT  DETECTED NOT DETECTED Final   Rotavirus A NOT DETECTED NOT DETECTED Final   Sapovirus (I, II, IV, and V) NOT DETECTED NOT DETECTED Final    Comment: Performed at [facility], [address] [place], [place] [address]  C Difficile Quick Screen w PCR reflex     Status: Abnormal   Collection Time: 11/14/23  6:43 PM   Specimen: STOOL  Result Value Ref Range Status   C Diff antigen POSITIVE (A) NEGATIVE Final   C Diff toxin NEGATIVE NEGATIVE Final  C Diff interpretation Results are indeterminate. See PCR results.  Final    Comment: Performed at [facility], [address] [place], [place] [address]  C. Diff by PCR, Reflexed     Status: None   Collection Time: 11/14/23  6:43 PM  Result Value Ref Range Status   Toxigenic C. Difficile by PCR NEGATIVE NEGATIVE Final    Comment: Patient is colonized with non toxigenic C. difficile. May not need treatment unless significant symptoms are present.   Hypervirulent Strain PRESUMPTIVE NEGATIVE PRESUMPTIVE NEGATIVE Final    Comment: Performed at [facility], [address] [place], [place] [address]         Radiology Studies: No results found.  Scheduled Meds:  acyclovir   400 mg Oral BID   allopurinol   300 mg Oral Daily   apixaban   5 mg Oral BID   calcium -vitamin D   1 tablet Oral BID   Chlorhexidine  Gluconate Cloth  6 each Topical Daily   cholecalciferol   5,000 Units Oral Daily   cyanocobalamin   1,000 mcg Oral Daily   ezetimibe   10 mg Oral QPM   feeding supplement  237 mL Oral BID BM   FLUoxetine   20 mg Oral Daily   fluticasone   1 spray Each Nare Daily   gabapentin   600 mg Oral BID   pantoprazole   40 mg Oral Daily   pravastatin   80 mg Oral Daily   pyridoxine   100 mg Oral Daily   Continuous Infusions:  sodium chloride  75 mL/hr at 11/15/23 1516     LOS: 2 days    Almarie KANDICE Hoots, MD 11/15/2023, 3:35 PM

## 2023-11-15 NOTE — Plan of Care (Signed)
   Problem: Clinical Measurements: Goal: Respiratory complications will improve Outcome: Progressing   Problem: Activity: Goal: Risk for activity intolerance will decrease Outcome: Progressing   Problem: Coping: Goal: Level of anxiety will decrease Outcome: Progressing

## 2023-11-16 ENCOUNTER — Inpatient Hospital Stay: Admitting: Hematology and Oncology

## 2023-11-16 ENCOUNTER — Other Ambulatory Visit

## 2023-11-16 ENCOUNTER — Inpatient Hospital Stay (HOSPITAL_COMMUNITY)

## 2023-11-16 ENCOUNTER — Encounter: Admitting: Dietician

## 2023-11-16 ENCOUNTER — Inpatient Hospital Stay

## 2023-11-16 DIAGNOSIS — R112 Nausea with vomiting, unspecified: Secondary | ICD-10-CM | POA: Diagnosis not present

## 2023-11-16 DIAGNOSIS — E876 Hypokalemia: Secondary | ICD-10-CM

## 2023-11-16 DIAGNOSIS — E871 Hypo-osmolality and hyponatremia: Secondary | ICD-10-CM | POA: Diagnosis not present

## 2023-11-16 DIAGNOSIS — R109 Unspecified abdominal pain: Secondary | ICD-10-CM | POA: Diagnosis not present

## 2023-11-16 LAB — GLUCOSE, CAPILLARY
Glucose-Capillary: 65 mg/dL — ABNORMAL LOW (ref 70–99)
Glucose-Capillary: 72 mg/dL (ref 70–99)
Glucose-Capillary: 71 mg/dL (ref 70–99)
Glucose-Capillary: 50 mg/dL — ABNORMAL LOW (ref 70–99)
Glucose-Capillary: 89 mg/dL (ref 70–99)
Glucose-Capillary: 97 mg/dL (ref 70–99)

## 2023-11-16 LAB — COMPREHENSIVE METABOLIC PANEL WITH GFR
ALT: 23 U/L (ref 0–44)
AST: 22 U/L (ref 15–41)
Albumin: 2.1 g/dL — ABNORMAL LOW (ref 3.5–5.0)
Alkaline Phosphatase: 55 U/L (ref 38–126)
Anion gap: 10 (ref 5–15)
BUN: 11 mg/dL (ref 8–23)
CO2: 20 mmol/L — ABNORMAL LOW (ref 22–32)
Calcium: 7.3 mg/dL — ABNORMAL LOW (ref 8.9–10.3)
Chloride: 100 mmol/L (ref 98–111)
Creatinine, Ser: 0.61 mg/dL (ref 0.61–1.24)
GFR, Estimated: >60 mL/min (ref >60)
Glucose, Bld: 67 mg/dL — ABNORMAL LOW (ref 70–99)
Potassium: 3.3 mmol/L — ABNORMAL LOW (ref 3.5–5.1)
Sodium: 130 mmol/L — ABNORMAL LOW (ref 135–145)
Total Bilirubin: 1 mg/dL (ref 0.0–1.2)
Total Protein: 4.7 g/dL — ABNORMAL LOW (ref 6.5–8.1)

## 2023-11-16 LAB — CBC
HCT: 33.9 % — ABNORMAL LOW (ref 39.0–52.0)
Hemoglobin: 10.9 g/dL — ABNORMAL LOW (ref 13.0–17.0)
MCH: 30.9 pg (ref 26.0–34.0)
MCHC: 32.2 g/dL (ref 30.0–36.0)
MCV: 96 fL (ref 80.0–100.0)
Platelets: 218 K/uL (ref 150–400)
RBC: 3.53 MIL/uL — ABNORMAL LOW (ref 4.22–5.81)
RDW: 17.2 % — ABNORMAL HIGH (ref 11.5–15.5)
WBC: 11.7 K/uL — ABNORMAL HIGH (ref 4.0–10.5)
nRBC: 0 % (ref 0.0–0.2)

## 2023-11-16 MED ORDER — IOHEXOL 300 MG/ML  SOLN
100.0000 mL | Freq: Once | INTRAMUSCULAR | Status: AC | PRN
  Administered 2023-11-16: 100 mL via INTRAVENOUS

## 2023-11-16 MED ORDER — KCL IN DEXTROSE-NACL 20-5-0.9 MEQ/L-%-% IV SOLN
INTRAVENOUS | Status: DC
  Filled 2023-11-16 (×3): qty 1000

## 2023-11-16 MED ORDER — POTASSIUM CHLORIDE CRYS ER 20 MEQ PO TBCR
40.0000 meq | EXTENDED_RELEASE_TABLET | Freq: Once | ORAL | Status: AC
  Administered 2023-11-16: 40 meq via ORAL
  Filled 2023-11-16: qty 2

## 2023-11-16 NOTE — Progress Notes (Signed)
 Physical Therapy Treatment Patient Details Name: David Mcgrath MRN: [id number] DOB: 1951/03/31 Today's Date: 11/16/2023   History of Present Illness David Mcgrath is a 73 y.o. male with  who presents to the ER with complaints of hypotension with SBP in the 70s and DBP in the 50s at home today, and loose stools.  Associated with cramping abdominal pain and poor oral intake for the past few days. Subjective hypotension, abdominal pain, and loose stools EFY:[id number], hyperlipidemia, paroxysmal A-fib on Eliquis , chronic anxiety/depression, polyneuropathy, multiple myeloma (diagnosed March 2025), recently admitted, from 7/31 through 11/05/2023, due to intractable nausea and vomiting from recurrent small bowel obstruction, had a diagnostic laparoscopy    PT Comments  Pt in bed when PT/OT arrive. He is agreeable to session but reports concern with timing for his contrast for upcoming imaging. Nursing states contrast not on floor yet and good time to see pt. He is able to come to sit EOB at min A, able to maintain balance without physical assist sitting EOB. Denies light headedness or dizziness throughout session. Pt completes Sit to Stand with RW at min A and bed height raised and able to maintain standing x1-2 mins. Pt stands and completes side steps to [facility] for positioning and returns to supine at min A for LE management. Discussed home setup and current functional status with pt. He has ramped entry and WC at home to accommodate functional mobility, pt states that he does not wish to go to rehab at dc and expresses strong desire to return home and resume HHPT whom he thinks very highly of and has had good success with in the past. Pt will benefit from continued acute PT while admitted and will continue to assess and update as indicated.    If plan is discharge home, recommend the following: Assistance with cooking/housework;Help with stairs or ramp for entrance;A little help with walking  and/or transfers;A little help with bathing/dressing/bathroom;Assist for transportation   Can travel by private vehicle        Equipment Recommendations  None recommended by PT    Recommendations for Other Services       Precautions / Restrictions Precautions Precautions: Fall Recall of Precautions/Restrictions: Intact Restrictions Weight Bearing Restrictions Per Provider Order: No     Mobility  Bed Mobility Overal bed mobility: Needs Assistance Bed Mobility: Supine to Sit, Sit to Supine     Supine to sit: Min assist Sit to supine: Mod assist   General bed mobility comments: Pt requires min A to come to sit EOB with HOb elevated, able to maintain sitting balance EOB at sup A, sit to supine pt requires LE assistance    Transfers Overall transfer level: Needs assistance Equipment used: Rolling walker (2 wheels) Transfers: Sit to/from Stand Sit to Stand: Min assist           General transfer comment: pt demonstrates improving tolerance to transfers with decreased level of assistance. Unable to progress mobility today due to LE weakness. He is able to side step to [facility] for positioning when returning to bed    Ambulation/Gait                   Stairs             Wheelchair Mobility     Tilt Bed    Modified Rankin (Stroke Patients Only)       Balance Overall balance assessment: Needs assistance Sitting-balance support: Feet supported, No upper extremity supported Sitting balance-Leahy  Scale: Fair     Standing balance support: During functional activity, Reliant on assistive device for balance, Bilateral upper extremity supported Standing balance-Leahy Scale: Fair                              Hotel manager: No apparent difficulties  Cognition Arousal: Alert Behavior During Therapy: WFL for tasks assessed/performed   PT - Cognitive impairments: No apparent impairments                          Following commands: Intact      Cueing Cueing Techniques: Verbal cues  Exercises      General Comments General comments (skin integrity, edema, etc.): IV site bleeding initially then stopped, nursing notified and checked during session. Pt waiting to take contrast for abdominal imaging soon      Pertinent Vitals/Pain Pain Assessment Pain Assessment: No/denies pain    Home Living                          Prior Function            PT Goals (current goals can now be found in the care plan section) Acute Rehab PT Goals Patient Stated Goal: Go home and improve strength PT Goal Formulation: With patient Time For Goal Achievement: 11/27/23 Potential to Achieve Goals: Good Progress towards PT goals: Progressing toward goals    Frequency    Min 3X/week      PT Plan      Co-evaluation PT/OT/SLP Co-Evaluation/Treatment: Yes Reason for Co-Treatment: Complexity of the patient's impairments (multi-system involvement);For patient/therapist safety;To address functional/ADL transfers PT goals addressed during session: Mobility/safety with mobility;Balance;Proper use of DME OT goals addressed during session: ADL's and self-care;Proper use of Adaptive equipment and DME      AM-PAC PT 6 Clicks Mobility   Outcome Measure  Help needed turning from your back to your side while in a flat bed without using bedrails?: A Little Help needed moving from lying on your back to sitting on the side of a flat bed without using bedrails?: A Little Help needed moving to and from a bed to a chair (including a wheelchair)?: A Little Help needed standing up from a chair using your arms (e.g., wheelchair or bedside chair)?: A Little Help needed to walk in hospital room?: A Lot Help needed climbing 3-5 steps with a railing? : A Lot 6 Click Score: 16    End of Session   Activity Tolerance: Patient limited by fatigue Patient left: in bed;with call bell/phone within reach;with  bed alarm set Nurse Communication: Mobility status PT Visit Diagnosis: Unsteadiness on feet (R26.81);Other abnormalities of gait and mobility (R26.89);Muscle weakness (generalized) (M62.81)     Time: 8656-8592 PT Time Calculation (min) (ACUTE ONLY): 24 min  Charges:    $Therapeutic Activity: 8-22 mins PT General Charges $$ ACUTE PT VISIT: 1 Visit                     Stann, PT Acute Rehabilitation Services Office: [phone] 11/16/2023    Stann DELENA Ohara 11/16/2023, 2:21 PM

## 2023-11-16 NOTE — Progress Notes (Signed)
 Occupational Therapy Treatment Patient Details Name: David Mcgrath MRN: [id number] DOB: December 04, 1950 Today's Date: 11/16/2023   History of present illness David Mcgrath is a 73 y.o. male with  who presents to the ER with complaints of hypotension associated with cramping abdominal pain and poor oral intake for the past few days. PMH: hypertension, hyperlipidemia, paroxysmal A-fib on Eliquis , chronic anxiety/depression, polyneuropathy, multiple myeloma (diagnosed March 2025), recently admitted, from 7/31 through 11/05/2023, due to intractable nausea and vomiting from recurrent small bowel obstruction, had a diagnostic laparoscopy   OT comments  Pt seen for OT / PT co-tx this date due to pt's limited tolerance to activity. Pt agreeable to session, spouse initially present. Pt denies pain, VSS on RA. Requires MIN A for bed mobility, min vcs for safety awareness of IV line/cath mgmt, and is able to sit EOB for majority of session. STS x2 with MIN A, cues to push up and assist for steadying at trunk.   Pt strongly prefers to return home at discharge, has wheelchair and ramped home entry. While OT feels pt will benefit from STR to improve ADL performance and independence, discharge recommendations updated to [facility] OT as pt expresses that he prefers [facility] therapists to [facility]. OT will continue progressing patient as able.       If plan is discharge home, recommend the following:  Two people to help with walking and/or transfers;Two people to help with bathing/dressing/bathroom;Assistance with cooking/housework;Assistance with feeding;Direct supervision/assist for medications management;Direct supervision/assist for financial management;Assist for transportation;Help with stairs or ramp for entrance;Supervision due to cognitive status   Equipment Recommendations  None recommended by OT       Precautions / Restrictions Precautions Precautions: Fall Recall of Precautions/Restrictions:  Intact Restrictions Weight Bearing Restrictions Per Provider Order: No       Mobility Bed Mobility Overal bed mobility: Needs Assistance Bed Mobility: Supine to Sit, Sit to Supine     Supine to sit: Min assist Sit to supine: Mod assist   General bed mobility comments: Pt requires min A to come to sit EOB with HOb elevated, able to maintain sitting balance EOB at sup A, sit to supine pt requires LE assistance    Transfers Overall transfer level: Needs assistance Equipment used: Rolling walker (2 wheels) Transfers: Sit to/from Stand Sit to Stand: Min assist           General transfer comment: pt demonstrates improving tolerance to transfers with decreased level of assistance. Unable to progress mobility today due to LE weakness. He is able to side step to [facility] for positioning when returning to bed     Balance Overall balance assessment: Needs assistance Sitting-balance support: Feet supported, No upper extremity supported Sitting balance-Leahy Scale: Fair     Standing balance support: During functional activity, Reliant on assistive device for balance, Bilateral upper extremity supported Standing balance-Leahy Scale: Fair Standing balance comment: B UE support and AD                           ADL either performed or assessed with clinical judgement   ADL Overall ADL's : Needs assistance/impaired                                       General ADL Comments: Session focused on improving activity tolerance and STS transfers     Communication Communication Communication:  No apparent difficulties   Cognition Arousal: Alert Behavior During Therapy: WFL for tasks assessed/performed                                 Following commands: Intact        Cueing   Cueing Techniques: Verbal cues        General Comments IV site bleeding initially then stopped, nursing notified and checked during session. Pt waiting to take contrast  for abdominal imaging soon    Pertinent Vitals/ Pain       Pain Assessment Pain Assessment: No/denies pain   Frequency  Min 2X/week        Progress Toward Goals  OT Goals(current goals can now be found in the care plan section)  Progress towards OT goals: Progressing toward goals  Acute Rehab OT Goals OT Goal Formulation: With patient/family Time For Goal Achievement: 11/28/23 Potential to Achieve Goals: Fair ADL Goals Pt Will Perform Lower Body Bathing: with min assist;sit to/from stand Pt Will Perform Lower Body Dressing: with min assist;sit to/from stand Pt Will Transfer to Toilet: with min assist;bedside commode Pt Will Perform Toileting - Clothing Manipulation and hygiene: with min assist;sit to/from stand Pt/caregiver will Perform Home Exercise Program: Increased strength;Both right and left upper extremity;With written HEP provided;With Supervision Additional ADL Goal #1: Patient will integrate simple ECT into BADL's and mobility with min cues  Plan      Co-evaluation    PT/OT/SLP Co-Evaluation/Treatment: Yes Reason for Co-Treatment: Complexity of the patient's impairments (multi-system involvement);For patient/therapist safety;To address functional/ADL transfers PT goals addressed during session: Mobility/safety with mobility;Balance;Proper use of DME OT goals addressed during session: ADL's and self-care;Proper use of Adaptive equipment and DME      AM-PAC OT 6 Clicks Daily Activity     Outcome Measure   Help from another person eating meals?: A Little Help from another person taking care of personal grooming?: A Little Help from another person toileting, which includes using toliet, bedpan, or urinal?: Total Help from another person bathing (including washing, rinsing, drying)?: A Lot Help from another person to put on and taking off regular upper body clothing?: A Lot Help from another person to put on and taking off regular lower body clothing?:  Total 6 Click Score: 12    End of Session Equipment Utilized During Treatment: Rolling walker (2 wheels)  OT Visit Diagnosis: Unsteadiness on feet (R26.81);Other abnormalities of gait and mobility (R26.89);Muscle weakness (generalized) (M62.81);Cognitive communication deficit (R41.841)   Activity Tolerance Patient limited by fatigue   Patient Left in bed;with call bell/phone within reach;with bed alarm set   Nurse Communication Mobility status;Other (comment) (IV site bleeding)        Time: 8655-8591 OT Time Calculation (min): 24 min  Charges: OT General Charges $OT Visit: 1 Visit OT Treatments $Self Care/Home Management : 8-22 mins  Damare Serano L. Mardene Lessig, OTR/L  11/16/23, 4:04 PM

## 2023-11-16 NOTE — Progress Notes (Signed)
 [facility] PROGRESS NOTE   David Mcgrath FMW:[id number] DOB: 08/15/1950 DOA: 11/12/2023  PCP: [name], DO  Brief History: 73 y.o. male with medical history significant for hypertension, hyperlipidemia, paroxysmal A-fib on Eliquis , chronic anxiety/depression, polyneuropathy, multiple myeloma (diagnosed March 2025) received Bortezomib  infusion 11/09/2023. Patient was admitted here 11/02/2023 through 11/05/2023 with intractable nausea vomiting, recurrent small bowel obstruction.  He was treated conservatively with NG tube decompression and discharged home.  Last admission CT abdomen/pelvis with contrast with slight improvement in small bowel dilation when compared to prior exam but with mild transition point deep in the pelvis, no pancreatic ductal dilation, no inflammatory changes, stable free fluid within the abdomen.  General surgery was consulted and patient underwent diagnostic laparoscopy by Dr. [name] on 11/03/2023 findings of intermittently dilated small bowel with erythema of the serosa consistent with inflammation/edema, intermittent injection of the small bowel mesentery, small bowel with tendency to migrate to the left upper quadrant with long/mobile small bowel mesentery with suspicion/potential for intermittent volvulized small bowel contributing to his symptoms and findings on imaging.  Diet was slowly advanced with toleration.   This admission CT abdomen showsIncreasing small bowel dilatation and fecalization of bowel contents involving the jejunum. No definitive transition point is identified at this time but caliber change is noted in the right mid abdomen at the junction of the jejunum and ileum. The more distal ileum is unremarkable. Stable ground-glass opacities in the lung bases similar to that seen on prior exams.   Consultants: None yet  Procedures: None yet    Subjective/Interval History: Patient had 2 episodes of nausea and vomiting yesterday.  He feels  unwell.  He also mentions having had bowel movements which have been semiformed.  Denies any abdominal pain.  His wife is at the bedside.   Assessment/Plan:  Ileus versus partial small bowel obstruction Patient presented with nausea vomiting and diarrhea.  CT scan raise concern for ileus versus partial SBO. Patient was given symptomatic treatment.  NGT was not placed.  General surgery was not consulted since he had improved. However this morning he reports feeling nauseated and had 2 episodes of vomiting yesterday.  He is also had semiformed stools. Patient's abdomen is somewhat benign to exam.  No distention noted.  In view of his continuing symptoms I think it is worthwhile repeating the CT scan and if abnormal then we may need to [facility] general surgery.  Patient reluctant to undergo NG tube placement but is agreeable to it if absolutely needed.  Diarrhea Reports semiformed stools.  GI pathogen panel unremarkable.  C. difficile testing was positive just for antigen.  C. difficile PCR was negative.  Unlikely to be true C. difficile.  Continue to monitor for now.  Hypotension Resolved with IV fluids  History of multiple myeloma Followed by medical oncology.  Some of his symptoms are thought to be due to his chemotherapeutic medications.  Paroxysmal atrial fibrillation on Eliquis  Eliquis  has been resumed.  Will hold for now in case he needs surgical intervention. Currently not on any rate limiting medications.  Home medication list reviewed.  He is on metoprolol  but only as needed. Continue to monitor on telemetry.  Acute urinary retention Foley catheter was placed on 8/11.  Voiding trial once he is more stable.  Normocytic anemia Stable hemoglobin noted.  Hyponatremia/hypokalemia Potassium will be supplemented  Hypoglycemia Probably from poor oral intake.  Check CBGs.  Chronic anxiety and depression Continue psychotropic medications.  Physical deconditioning PT and  OT.  DVT Prophylaxis: On apixaban  which will be held today Code Status: Full code Family Communication: Discussed with his wife who was at bedside Disposition Plan: SNF is being considered    Medications: Scheduled:  acyclovir   400 mg Oral BID   allopurinol   300 mg Oral Daily   apixaban   5 mg Oral BID   calcium -vitamin D   1 tablet Oral BID   Chlorhexidine  Gluconate Cloth  6 each Topical Daily   cholecalciferol   5,000 Units Oral Daily   cyanocobalamin   1,000 mcg Oral Daily   ezetimibe   10 mg Oral QPM   feeding supplement  237 mL Oral BID BM   FLUoxetine   20 mg Oral Daily   fluticasone   1 spray Each Nare Daily   gabapentin   600 mg Oral BID   pantoprazole   40 mg Oral Daily   pravastatin   80 mg Oral Daily   pyridoxine   100 mg Oral Daily   saccharomyces boulardii  250 mg Oral BID   Continuous:  sodium chloride  75 mL/hr at 11/16/23 0814   PRN:acetaminophen , diphenoxylate -atropine , guaiFENesin , melatonin, mouth rinse, prochlorperazine   Antibiotics: Anti-infectives (From admission, onward)    Start     Dose/Rate Route Frequency Ordered Stop   11/13/23 1000  acyclovir  (ZOVIRAX ) tablet 400 mg        400 mg Oral 2 times daily 11/13/23 0503         Objective:  Vital Signs  Vitals:   11/15/23 0551 11/15/23 1413 11/15/23 1954 11/16/23 0511  BP: (!) 117/55 121/63 (!) 128/55 (!) 143/81  Pulse: 83 92 65 (!) 42  Resp: 20 12 18 18   Temp: 98 F (36.7 C) 97.8 F (36.6 C) 98.2 F (36.8 C) 98.2 F (36.8 C)  TempSrc:  Oral Oral Oral  SpO2: 94% 100% 100% 99%  Weight:      Height:        Intake/Output Summary (Last 24 hours) at 11/16/2023 1131 Last data filed at 11/16/2023 0500 Gross per 24 hour  Intake 986.15 ml  Output 900 ml  Net 86.15 ml   Filed Weights   11/12/23 2211 11/13/23 0136  Weight: 83.5 kg 92.7 kg    General appearance: Awake alert.  In no distress Resp: Clear to auscultation bilaterally.  Normal effort Cardio: S1-S2 is normal regular.  No S3-S4.  No  rubs murmurs or bruit GI: Abdomen is soft.  Mildly tender without any distention.  No rebound rigidity or guarding.  Bowel sounds present. Extremities: No edema.  Good deconditioning is noted. Neurologic: Alert and oriented x3.  No focal neurological deficits.    Lab Results:  Data Reviewed: I have personally reviewed following labs and reports of the imaging studies  CBC: Recent Labs  Lab 11/09/23 1512 11/12/23 2244 11/13/23 0543 11/14/23 0514 11/15/23 0527 11/16/23 0524  WBC 9.1 10.0 13.1* 13.5* 12.5* 11.7*  NEUTROABS 8.6* 8.2*  --   --   --   --   HGB 11.7* 12.6* 11.9* 10.3* 11.0* 10.9*  HCT 34.7* 38.4* 36.9* 30.4* 33.9* 33.9*  MCV 91.8 94.1 93.9 94.7 95.8 96.0  PLT 290 218 212 158 175 218    Basic Metabolic Panel: Recent Labs  Lab 11/09/23 1512 11/12/23 2244 11/13/23 0543 11/14/23 0514 11/15/23 0527 11/16/23 0524  NA 132* 132* 132* 133* 131* 130*  K 3.8 3.5 3.5 3.3* 3.7 3.3*  CL 100 98 99 102 102 100  CO2 25 22 23 23  20* 20*  GLUCOSE 106* 80 67* 71 72 67*  BUN 9  9 9 10 11 11   CREATININE 0.72 0.87 0.72 0.74 0.68 0.61  CALCIUM  7.7* 7.5* 7.3* 6.8* 7.2* 7.3*  MG 1.3*  --  1.3*  --  1.7  --   PHOS  --   --  2.5  --   --   --     GFR: Estimated Creatinine Clearance: 102.5 mL/min (by C-G formula based on SCr of 0.61 mg/dL).  Liver Function Tests: Recent Labs  Lab 11/09/23 1512 11/12/23 2244 11/14/23 0514 11/15/23 0527 11/16/23 0524  AST 36 32 24 20 22   ALT 32 33 24 22 23   ALKPHOS 64 50 45 51 55  BILITOT 0.5 0.5 0.5 0.8 1.0  PROT 5.5* 5.7* 4.4* 4.5* 4.7*  ALBUMIN  3.0* 2.6* 2.0* 2.0* 2.1*    Coagulation Profile: Recent Labs  Lab 11/13/23 0543  INR 1.4*     Recent Results (from the past 240 hours)  Blood Culture (routine x 2)     Status: None (Preliminary result)   Collection Time: 11/12/23 10:42 PM   Specimen: BLOOD RIGHT FOREARM  Result Value Ref Range Status   Specimen Description   Final    BLOOD RIGHT FOREARM Performed at [facility], [address] [place], [place] [address]    Special Requests   Final    BOTTLES DRAWN AEROBIC AND ANAEROBIC Blood Culture adequate volume Performed at [facility], [address], [place], [place] [address]    Culture   Final    NO GROWTH 3 DAYS Performed at [facility], [address] [place], [place] [address]    Report Status PENDING  Incomplete  Blood Culture (routine x 2)     Status: None (Preliminary result)   Collection Time: 11/12/23 10:42 PM   Specimen: BLOOD  Result Value Ref Range Status   Specimen Description   Final    BLOOD BLOOD RIGHT HAND Performed at [facility], [address], [place], [place] [address]    Special Requests   Final    BOTTLES DRAWN AEROBIC AND ANAEROBIC Blood Culture adequate volume Performed at [facility] [address], [place], [place] [address]    Culture   Final    NO GROWTH 3 DAYS Performed at [facility], [address], [place], [place] [address]    Report Status PENDING  Incomplete  Gastrointestinal Panel by PCR , Stool     Status: None   Collection Time: 11/14/23  6:42 PM   Specimen: STOOL  Result Value Ref Range Status   Campylobacter species NOT DETECTED NOT DETECTED Final   Plesimonas shigelloides NOT DETECTED NOT DETECTED Final   Salmonella species NOT DETECTED NOT DETECTED Final   Yersinia enterocolitica NOT DETECTED NOT DETECTED Final   Vibrio species NOT DETECTED NOT DETECTED Final   Vibrio cholerae NOT DETECTED NOT DETECTED Final   Enteroaggregative E coli (EAEC) NOT DETECTED NOT DETECTED Final   Enteropathogenic E coli (EPEC) NOT DETECTED NOT DETECTED Final   Enterotoxigenic E coli (ETEC) NOT DETECTED NOT DETECTED Final   Shiga like toxin producing E coli (STEC) NOT DETECTED NOT DETECTED Final   Shigella/Enteroinvasive E coli (EIEC) NOT DETECTED NOT DETECTED Final   Cryptosporidium NOT DETECTED NOT DETECTED Final   Cyclospora  cayetanensis NOT DETECTED NOT DETECTED Final   Entamoeba histolytica NOT DETECTED NOT DETECTED Final   Giardia lamblia NOT DETECTED NOT DETECTED Final   Adenovirus F40/41 NOT DETECTED NOT DETECTED Final  Astrovirus NOT DETECTED NOT DETECTED Final   Norovirus GI/GII NOT DETECTED NOT DETECTED Final   Rotavirus A NOT DETECTED NOT DETECTED Final   Sapovirus (I, II, IV, and V) NOT DETECTED NOT DETECTED Final    Comment: Performed at [facility], [address] [place], [place] [address]  C Difficile Quick Screen w PCR reflex     Status: Abnormal   Collection Time: 11/14/23  6:43 PM   Specimen: STOOL  Result Value Ref Range Status   C Diff antigen POSITIVE (A) NEGATIVE Final   C Diff toxin NEGATIVE NEGATIVE Final   C Diff interpretation Results are indeterminate. See PCR results.  Final    Comment: Performed at [facility], [address] [place], [place] [address]  C. Diff by PCR, Reflexed     Status: None   Collection Time: 11/14/23  6:43 PM  Result Value Ref Range Status   Toxigenic C. Difficile by PCR NEGATIVE NEGATIVE Final    Comment: Patient is colonized with non toxigenic C. difficile. May not need treatment unless significant symptoms are present.   Hypervirulent Strain PRESUMPTIVE NEGATIVE PRESUMPTIVE NEGATIVE Final    Comment: Performed at [facility], [address], [place], [place] [address]      Radiology Studies: No results found.     LOS: 3 days   Erionna Strum [employer] on [email or web address]  11/16/2023, 11:31 AM

## 2023-11-16 NOTE — Plan of Care (Signed)
   Problem: Activity: Goal: Risk for activity intolerance will decrease Outcome: Progressing   Problem: Nutrition: Goal: Adequate nutrition will be maintained Outcome: Progressing   Problem: Elimination: Goal: Will not experience complications related to bowel motility Outcome: Progressing

## 2023-11-17 ENCOUNTER — Inpatient Hospital Stay (HOSPITAL_COMMUNITY)

## 2023-11-17 DIAGNOSIS — E876 Hypokalemia: Secondary | ICD-10-CM | POA: Diagnosis not present

## 2023-11-17 DIAGNOSIS — E871 Hypo-osmolality and hyponatremia: Secondary | ICD-10-CM | POA: Diagnosis not present

## 2023-11-17 DIAGNOSIS — R112 Nausea with vomiting, unspecified: Secondary | ICD-10-CM | POA: Diagnosis not present

## 2023-11-17 DIAGNOSIS — K56609 Unspecified intestinal obstruction, unspecified as to partial versus complete obstruction: Secondary | ICD-10-CM | POA: Diagnosis not present

## 2023-11-17 DIAGNOSIS — R110 Nausea: Secondary | ICD-10-CM | POA: Diagnosis not present

## 2023-11-17 DIAGNOSIS — K5669 Other partial intestinal obstruction: Secondary | ICD-10-CM | POA: Diagnosis not present

## 2023-11-17 LAB — GLUCOSE, CAPILLARY
Glucose-Capillary: 120 mg/dL — ABNORMAL HIGH (ref 70–99)
Glucose-Capillary: 120 mg/dL — ABNORMAL HIGH (ref 70–99)
Glucose-Capillary: 123 mg/dL — ABNORMAL HIGH (ref 70–99)

## 2023-11-17 LAB — COMPREHENSIVE METABOLIC PANEL WITH GFR
ALT: 22 U/L (ref 0–44)
AST: 22 U/L (ref 15–41)
Albumin: 2.1 g/dL — ABNORMAL LOW (ref 3.5–5.0)
Alkaline Phosphatase: 56 U/L (ref 38–126)
Anion gap: 10 (ref 5–15)
BUN: 12 mg/dL (ref 8–23)
CO2: 20 mmol/L — ABNORMAL LOW (ref 22–32)
Calcium: 7.3 mg/dL — ABNORMAL LOW (ref 8.9–10.3)
Chloride: 99 mmol/L (ref 98–111)
Creatinine, Ser: 0.58 mg/dL — ABNORMAL LOW (ref 0.61–1.24)
GFR, Estimated: >60 mL/min (ref >60)
Glucose, Bld: 124 mg/dL — ABNORMAL HIGH (ref 70–99)
Potassium: 3.7 mmol/L (ref 3.5–5.1)
Sodium: 129 mmol/L — ABNORMAL LOW (ref 135–145)
Total Bilirubin: 0.9 mg/dL (ref 0.0–1.2)
Total Protein: 4.8 g/dL — ABNORMAL LOW (ref 6.5–8.1)

## 2023-11-17 LAB — CBC
HCT: 34.9 % — ABNORMAL LOW (ref 39.0–52.0)
Hemoglobin: 11.9 g/dL — ABNORMAL LOW (ref 13.0–17.0)
MCH: 31.7 pg (ref 26.0–34.0)
MCHC: 34.1 g/dL (ref 30.0–36.0)
MCV: 93.1 fL (ref 80.0–100.0)
Platelets: 244 K/uL (ref 150–400)
RBC: 3.75 MIL/uL — ABNORMAL LOW (ref 4.22–5.81)
RDW: 17.1 % — ABNORMAL HIGH (ref 11.5–15.5)
WBC: 12.2 K/uL — ABNORMAL HIGH (ref 4.0–10.5)
nRBC: 0 % (ref 0.0–0.2)

## 2023-11-17 LAB — MAGNESIUM: Magnesium: 1.6 mg/dL — ABNORMAL LOW (ref 1.7–2.4)

## 2023-11-17 MED ORDER — KCL IN DEXTROSE-NACL 20-5-0.9 MEQ/L-%-% IV SOLN
INTRAVENOUS | Status: DC
  Administered 2023-11-17: 50 mL/h via INTRAVENOUS
  Filled 2023-11-17 (×2): qty 1000

## 2023-11-17 MED ORDER — DIATRIZOATE MEGLUMINE & SODIUM 66-10 % PO SOLN
90.0000 mL | Freq: Once | ORAL | Status: AC
  Administered 2023-11-17: 90 mL via ORAL
  Filled 2023-11-17: qty 90

## 2023-11-17 MED ORDER — ENOXAPARIN SODIUM 40 MG/0.4ML IJ SOSY
40.0000 mg | PREFILLED_SYRINGE | INTRAMUSCULAR | Status: DC
  Administered 2023-11-18 – 2023-11-20 (×3): 40 mg via SUBCUTANEOUS
  Filled 2023-11-17 (×5): qty 0.4

## 2023-11-17 MED ORDER — MAGNESIUM SULFATE 4 GM/100ML IV SOLN
4.0000 g | Freq: Once | INTRAVENOUS | Status: AC
  Administered 2023-11-17: 4 g via INTRAVENOUS
  Filled 2023-11-17: qty 100

## 2023-11-17 MED ORDER — ONDANSETRON HCL 4 MG/2ML IJ SOLN
4.0000 mg | Freq: Once | INTRAMUSCULAR | Status: AC
  Administered 2023-11-17: 4 mg via INTRAVENOUS
  Filled 2023-11-17: qty 2

## 2023-11-17 NOTE — Progress Notes (Signed)
   Subjective/Chief Complaint: Having some flatus and small bm, ct reviewed   Objective: Vital signs in last 24 hours: Temp:  [98.2 F (36.8 C)-98.6 F (37 C)] 98.6 F (37 C) (08/15 0407) Pulse Rate:  [79-97] 97 (08/15 0407) Resp:  [17-18] 18 (08/15 0407) BP: (116-145)/(64-81) 116/64 (08/15 0407) SpO2:  [95 %-100 %] 95 % (08/15 0407) Last BM Date : 11/16/23  Intake/Output from previous day: 08/14 0701 - 08/15 0700 In: 1265.8 [I.V.:1265.8] Out: 275 [Urine:275] Intake/Output this shift: No intake/output data recorded.  Ab soft mild distended nontender incisions clean  Lab Results:  Recent Labs    11/16/23 0524 11/17/23 0506  WBC 11.7* 12.2*  HGB 10.9* 11.9*  HCT 33.9* 34.9*  PLT 218 244   BMET Recent Labs    11/16/23 0524 11/17/23 0506  NA 130* 129*  K 3.3* 3.7  CL 100 99  CO2 20* 20*  GLUCOSE 67* 124*  BUN 11 12  CREATININE 0.61 0.58*  CALCIUM  7.3* 7.3*   PT/INR No results for input(s): LABPROT, INR in the last 72 hours. ABG No results for input(s): PHART, HCO3 in the last 72 hours.  Invalid input(s): PCO2, PO2  Studies/Results: CT ABDOMEN PELVIS W CONTRAST Result Date: 11/17/2023 CLINICAL DATA:  Abdominal pain EXAM: CT ABDOMEN AND PELVIS WITH CONTRAST TECHNIQUE: Multidetector CT imaging of the abdomen and pelvis was performed using the standard protocol following bolus administration of intravenous contrast. RADIATION DOSE REDUCTION: This exam was performed according to the departmental dose-optimization program which includes automated exposure control, adjustment of the mA and/or kV according to patient size and/or use of iterative reconstruction technique. CONTRAST:  OMNIPAQUE  IOHEXOL  300 MG/ML  SOLN COMPARISON:  11/13/2023 FINDINGS: Lower chest: Lung bases again demonstrate ground-glass opacities in the bases slightly increased when compared with the prior exam. Hepatobiliary: No focal liver abnormality is seen. No gallstones,  gallbladder wall thickening, or biliary dilatation. Pancreas: Unremarkable. No pancreatic ductal dilatation or surrounding inflammatory changes. Spleen: Normal in size without focal abnormality. Adrenals/Urinary Tract: Adrenal glands are within normal limits. Kidneys demonstrate a normal enhancement pattern. No obstructive changes are noted. The bladder is decompressed by Foley catheter. Stomach/Bowel: Colon is decompressed. Persistent small bowel dilatation is noted involving the jejunum similar to that seen on the prior exam. Again a transition zone is noted in the right mid abdomen although no discrete mass is seen. The distal ileum is unremarkable. Stomach is dilated with ingested fluids. Vascular/Lymphatic: Aortic atherosclerosis. No enlarged abdominal or pelvic lymph nodes. Reproductive: Prostate is unremarkable. Other: Mild free fluid is noted within the pelvis. Stable from the prior exam. Musculoskeletal: No acute or significant osseous findings. IMPRESSION: Persistent small bowel dilatation involving the jejunum similar to that seen on the prior exam. No new focal abnormality is noted. Electronically Signed   By: Oneil Devonshire M.D.   On: 11/17/2023 03:08    Anti-infectives: Anti-infectives (From admission, onward)    Start     Dose/Rate Route Frequency Ordered Stop   11/13/23 1000  acyclovir  (ZOVIRAX ) tablet 400 mg        400 mg Oral 2 times daily 11/13/23 0503         Assessment/Plan: POD 14 dx lsc for possible sbo-negative -still with symptoms, does not appear to be surgical in nature -will do some gg today to see how this goes  11/17/2023

## 2023-11-17 NOTE — Progress Notes (Signed)
 [facility] PROGRESS NOTE   David Mcgrath FMW:[id number] DOB: 1950/08/09 DOA: 11/12/2023  PCP: [name], DO  Brief History: 73 y.o. male with medical history significant for hypertension, hyperlipidemia, paroxysmal A-fib on Eliquis , chronic anxiety/depression, polyneuropathy, multiple myeloma (diagnosed March 2025) received Bortezomib  infusion 11/09/2023. Patient was admitted here 11/02/2023 through 11/05/2023 with intractable nausea vomiting, recurrent small bowel obstruction.  He was treated conservatively with NG tube decompression and discharged home.  Last admission CT abdomen/pelvis with contrast with slight improvement in small bowel dilation when compared to prior exam but with mild transition point deep in the pelvis, no pancreatic ductal dilation, no inflammatory changes, stable free fluid within the abdomen.  General surgery was consulted and patient underwent diagnostic laparoscopy by Dr. [name] on 11/03/2023 findings of intermittently dilated small bowel with erythema of the serosa consistent with inflammation/edema, intermittent injection of the small bowel mesentery, small bowel with tendency to migrate to the left upper quadrant with long/mobile small bowel mesentery with suspicion/potential for intermittent volvulized small bowel contributing to his symptoms and findings on imaging.  Diet was slowly advanced with toleration.   This admission CT abdomen showsIncreasing small bowel dilatation and fecalization of bowel contents involving the jejunum. No definitive transition point is identified at this time but caliber change is noted in the right mid abdomen at the junction of the jejunum and ileum. The more distal ileum is unremarkable. Stable ground-glass opacities in the lung bases similar to that seen on prior exams.   Consultants: General Surgery  Procedures: None yet    Subjective/Interval History: Patient had an episode of emesis yesterday after he had the  oral contrast for CT scan.  No emesis since then but he still feels poorly had some nausea.  Denies any abdominal pain.  Feels weak.  Finding it difficult to ambulate.     Assessment/Plan:  Ileus versus partial small bowel obstruction Patient presented with nausea vomiting and diarrhea.  CT scan raise concern for ileus versus partial SBO. Patient was given symptomatic treatment.  NGT was not placed.  Patient initially started improving but then had recurrence of his nausea and vomiting yesterday.  CT scan was repeated which showed persistent small bowel dilatation with prominent jejunum as seen before. Has had bowel movements in the last 24 hours. Discussed with general surgery who will evaluate the patient today. Continue liquid diet for now.  Diarrhea Reports semiformed stools.  GI pathogen panel unremarkable.  C. difficile testing was positive just for antigen.  C. difficile PCR was negative.  Unlikely to be true C. difficile.  Continue to monitor for now.  Hypotension Resolved with IV fluids  History of multiple myeloma Followed by medical oncology, Dr. Federico.  Some of his symptoms are thought to be due to his chemotherapeutic medications.  Paroxysmal atrial fibrillation on Eliquis  Eliquis  is currently on hold in case he needs surgical intervention.   Currently not on any rate limiting medications.  Home medication list reviewed.  He is on metoprolol  but only as needed. Continue to monitor on telemetry.  Acute urinary retention Foley catheter was placed on 8/11.  Voiding trial once he is more stable.  Normocytic anemia Stable hemoglobin noted.  Hyponatremia/hypokalemia/hypomagnesemia Potassium was supplemented.  Sodium level remains low.  There could be an element of SIADH.  Continue to monitor.  Supplement magnesium .  Hypoglycemia Probably from poor oral intake.  Started on D5 infusion with improvement in glucose levels.  Chronic anxiety and depression Continue  psychotropic medications.  Physical deconditioning PT and OT.   DVT Prophylaxis: SCDs for now.  Apixaban  on hold. Code Status: Full code Family Communication: Discussed with his wife who was at bedside Disposition Plan: SNF is being considered    Medications: Scheduled:  acyclovir   400 mg Oral BID   allopurinol   300 mg Oral Daily   calcium -vitamin D   1 tablet Oral BID   Chlorhexidine  Gluconate Cloth  6 each Topical Daily   cholecalciferol   5,000 Units Oral Daily   cyanocobalamin   1,000 mcg Oral Daily   diatrizoate  meglumine -sodium  90 mL Oral Once   ezetimibe   10 mg Oral QPM   feeding supplement  237 mL Oral BID BM   FLUoxetine   20 mg Oral Daily   fluticasone   1 spray Each Nare Daily   gabapentin   600 mg Oral BID   pantoprazole   40 mg Oral Daily   pravastatin   80 mg Oral Daily   pyridoxine   100 mg Oral Daily   saccharomyces boulardii  250 mg Oral BID   Continuous:  dextrose  5 % and 0.9 % NaCl with KCl 20 mEq/L 75 mL/hr at 11/17/23 0831   magnesium  sulfate bolus IVPB 4 g (11/17/23 0836)   PRN:acetaminophen , diphenoxylate -atropine , guaiFENesin , melatonin, mouth rinse, prochlorperazine   Antibiotics: Anti-infectives (From admission, onward)    Start     Dose/Rate Route Frequency Ordered Stop   11/13/23 1000  acyclovir  (ZOVIRAX ) tablet 400 mg        400 mg Oral 2 times daily 11/13/23 0503         Objective:  Vital Signs  Vitals:   11/16/23 0511 11/16/23 1318 11/16/23 1959 11/17/23 0407  BP: (!) 143/81 (!) 145/72 (!) 143/81 116/64  Pulse: (!) 42 79 80 97  Resp: 18 17 18 18   Temp: 98.2 F (36.8 C) 98.2 F (36.8 C) 98.2 F (36.8 C) 98.6 F (37 C)  TempSrc: Oral Oral Oral Oral  SpO2: 99% 100% 97% 95%  Weight:      Height:        Intake/Output Summary (Last 24 hours) at 11/17/2023 1015 Last data filed at 11/16/2023 1835 Gross per 24 hour  Intake 1265.75 ml  Output 275 ml  Net 990.75 ml   Filed Weights   11/12/23 2211 11/13/23 0136  Weight: 83.5 kg 92.7  kg    General appearance: Awake alert.  In no distress Resp: Clear to auscultation bilaterally.  Normal effort Cardio: S1-S2 is normal regular.  No S3-S4.  No rubs murmurs or bruit GI: Abdomen is soft.  Bowel sounds sluggish but present.  Nontender for the most part. No focal neurological deficits.  Lab Results:  Data Reviewed: I have personally reviewed following labs and reports of the imaging studies  CBC: Recent Labs  Lab 11/12/23 2244 11/13/23 0543 11/14/23 0514 11/15/23 0527 11/16/23 0524 11/17/23 0506  WBC 10.0 13.1* 13.5* 12.5* 11.7* 12.2*  NEUTROABS 8.2*  --   --   --   --   --   HGB 12.6* 11.9* 10.3* 11.0* 10.9* 11.9*  HCT 38.4* 36.9* 30.4* 33.9* 33.9* 34.9*  MCV 94.1 93.9 94.7 95.8 96.0 93.1  PLT 218 212 158 175 218 244    Basic Metabolic Panel: Recent Labs  Lab 11/13/23 0543 11/14/23 0514 11/15/23 0527 11/16/23 0524 11/17/23 0506  NA 132* 133* 131* 130* 129*  K 3.5 3.3* 3.7 3.3* 3.7  CL 99 102 102 100 99  CO2 23 23 20* 20* 20*  GLUCOSE 67* 71 72 67* 124*  BUN 9 10 11 11 12   CREATININE 0.72 0.74 0.68 0.61 0.58*  CALCIUM  7.3* 6.8* 7.2* 7.3* 7.3*  MG 1.3*  --  1.7  --  1.6*  PHOS 2.5  --   --   --   --     GFR: Estimated Creatinine Clearance: 102.5 mL/min (A) (by C-G formula based on SCr of 0.58 mg/dL (L)).  Liver Function Tests: Recent Labs  Lab 11/12/23 2244 11/14/23 0514 11/15/23 0527 11/16/23 0524 11/17/23 0506  AST 32 24 20 22 22   ALT 33 24 22 23 22   ALKPHOS 50 45 51 55 56  BILITOT 0.5 0.5 0.8 1.0 0.9  PROT 5.7* 4.4* 4.5* 4.7* 4.8*  ALBUMIN  2.6* 2.0* 2.0* 2.1* 2.1*    Coagulation Profile: Recent Labs  Lab 11/13/23 0543  INR 1.4*     Recent Results (from the past 240 hours)  Blood Culture (routine x 2)     Status: None (Preliminary result)   Collection Time: 11/12/23 10:42 PM   Specimen: BLOOD RIGHT FOREARM  Result Value Ref Range Status   Specimen Description   Final    BLOOD RIGHT FOREARM Performed at [facility], [address], [place], [place] [address]    Special Requests   Final    BOTTLES DRAWN AEROBIC AND ANAEROBIC Blood Culture adequate volume Performed at [facility], [address] [place], [place] [address]    Culture   Final    NO GROWTH 4 DAYS Performed at [facility], [address], [place], [place] [address]    Report Status PENDING  Incomplete  Blood Culture (routine x 2)     Status: None (Preliminary result)   Collection Time: 11/12/23 10:42 PM   Specimen: BLOOD  Result Value Ref Range Status   Specimen Description   Final    BLOOD BLOOD RIGHT HAND Performed at [facility], [address] [place], [place] [address]    Special Requests   Final    BOTTLES DRAWN AEROBIC AND ANAEROBIC Blood Culture adequate volume Performed at [facility], [address] [place], [place] [address]    Culture   Final    NO GROWTH 4 DAYS Performed at [facility], [address] [place], [place] [address]    Report Status PENDING  Incomplete  Gastrointestinal Panel by PCR , Stool     Status: None   Collection Time: 11/14/23  6:42 PM   Specimen: STOOL  Result Value Ref Range Status   Campylobacter species NOT DETECTED NOT DETECTED Final   Plesimonas shigelloides NOT DETECTED NOT DETECTED Final   Salmonella species NOT DETECTED NOT DETECTED Final   Yersinia enterocolitica NOT DETECTED NOT DETECTED Final   Vibrio species NOT DETECTED NOT DETECTED Final   Vibrio cholerae NOT DETECTED NOT DETECTED Final   Enteroaggregative E coli (EAEC) NOT DETECTED NOT DETECTED Final   Enteropathogenic E coli (EPEC) NOT DETECTED NOT DETECTED Final   Enterotoxigenic E coli (ETEC) NOT DETECTED NOT DETECTED Final   Shiga like toxin producing E coli (STEC) NOT DETECTED NOT DETECTED Final   Shigella/Enteroinvasive E coli (EIEC) NOT DETECTED NOT DETECTED Final   Cryptosporidium NOT DETECTED NOT DETECTED Final   Cyclospora  cayetanensis NOT DETECTED NOT DETECTED Final   Entamoeba histolytica NOT DETECTED NOT DETECTED Final   Giardia lamblia NOT DETECTED NOT DETECTED Final   Adenovirus F40/41 NOT DETECTED NOT DETECTED Final   Astrovirus NOT DETECTED NOT DETECTED  Final   Norovirus GI/GII NOT DETECTED NOT DETECTED Final   Rotavirus A NOT DETECTED NOT DETECTED Final   Sapovirus (I, II, IV, and V) NOT DETECTED NOT DETECTED Final    Comment: Performed at [facility], [address] [place], [place] [address]  C Difficile Quick Screen w PCR reflex     Status: Abnormal   Collection Time: 11/14/23  6:43 PM   Specimen: STOOL  Result Value Ref Range Status   C Diff antigen POSITIVE (A) NEGATIVE Final   C Diff toxin NEGATIVE NEGATIVE Final   C Diff interpretation Results are indeterminate. See PCR results.  Final    Comment: Performed at [facility], [address], [place], [place] [address]  C. Diff by PCR, Reflexed     Status: None   Collection Time: 11/14/23  6:43 PM  Result Value Ref Range Status   Toxigenic C. Difficile by PCR NEGATIVE NEGATIVE Final    Comment: Patient is colonized with non toxigenic C. difficile. May not need treatment unless significant symptoms are present.   Hypervirulent Strain PRESUMPTIVE NEGATIVE PRESUMPTIVE NEGATIVE Final    Comment: Performed at [facility], [address], [place], [place] [address]      Radiology Studies: CT ABDOMEN PELVIS W CONTRAST Result Date: 11/17/2023 CLINICAL DATA:  Abdominal pain EXAM: CT ABDOMEN AND PELVIS WITH CONTRAST TECHNIQUE: Multidetector CT imaging of the abdomen and pelvis was performed using the standard protocol following bolus administration of intravenous contrast. RADIATION DOSE REDUCTION: This exam was performed according to the departmental dose-optimization program which includes automated exposure control, adjustment of the mA and/or kV according to patient size and/or use of iterative reconstruction  technique. CONTRAST:  OMNIPAQUE  IOHEXOL  300 MG/ML  SOLN COMPARISON:  11/13/2023 FINDINGS: Lower chest: Lung bases again demonstrate ground-glass opacities in the bases slightly increased when compared with the prior exam. Hepatobiliary: No focal liver abnormality is seen. No gallstones, gallbladder wall thickening, or biliary dilatation. Pancreas: Unremarkable. No pancreatic ductal dilatation or surrounding inflammatory changes. Spleen: Normal in size without focal abnormality. Adrenals/Urinary Tract: Adrenal glands are within normal limits. Kidneys demonstrate a normal enhancement pattern. No obstructive changes are noted. The bladder is decompressed by Foley catheter. Stomach/Bowel: Colon is decompressed. Persistent small bowel dilatation is noted involving the jejunum similar to that seen on the prior exam. Again a transition zone is noted in the right mid abdomen although no discrete mass is seen. The distal ileum is unremarkable. Stomach is dilated with ingested fluids. Vascular/Lymphatic: Aortic atherosclerosis. No enlarged abdominal or pelvic lymph nodes. Reproductive: Prostate is unremarkable. Other: Mild free fluid is noted within the pelvis. Stable from the prior exam. Musculoskeletal: No acute or significant osseous findings. IMPRESSION: Persistent small bowel dilatation involving the jejunum similar to that seen on the prior exam. No new focal abnormality is noted. Electronically Signed   By: Oneil Devonshire M.D.   On: 11/17/2023 03:08       LOS: 4 days   Danae Oland Verdene  [facility] Pager on [email or web address]  11/17/2023, 10:15 AM

## 2023-11-17 NOTE — Progress Notes (Signed)
 OT Cancellation Note  Patient Details Name: David Mcgrath MRN: [id number] DOB: 05/24/1950   Cancelled Treatment:    Reason Eval/Treat Not Completed: Fatigue/lethargy limiting ability to participate;Other (comment). Pt with nausea, overall not feeling well and pt/family request OT try back another time  Jacques Karna Loose 11/17/2023, 11:29 AM

## 2023-11-17 NOTE — Plan of Care (Signed)

## 2023-11-18 ENCOUNTER — Other Ambulatory Visit: Payer: Self-pay

## 2023-11-18 ENCOUNTER — Inpatient Hospital Stay (HOSPITAL_COMMUNITY)

## 2023-11-18 DIAGNOSIS — R110 Nausea: Secondary | ICD-10-CM | POA: Diagnosis not present

## 2023-11-18 DIAGNOSIS — L8990 Pressure ulcer of unspecified site, unspecified stage: Secondary | ICD-10-CM | POA: Insufficient documentation

## 2023-11-18 DIAGNOSIS — R918 Other nonspecific abnormal finding of lung field: Secondary | ICD-10-CM | POA: Diagnosis not present

## 2023-11-18 DIAGNOSIS — K567 Ileus, unspecified: Secondary | ICD-10-CM | POA: Diagnosis not present

## 2023-11-18 DIAGNOSIS — K5669 Other partial intestinal obstruction: Secondary | ICD-10-CM | POA: Diagnosis not present

## 2023-11-18 DIAGNOSIS — K6389 Other specified diseases of intestine: Secondary | ICD-10-CM | POA: Diagnosis not present

## 2023-11-18 DIAGNOSIS — J9690 Respiratory failure, unspecified, unspecified whether with hypoxia or hypercapnia: Secondary | ICD-10-CM | POA: Diagnosis not present

## 2023-11-18 LAB — CULTURE, BLOOD (ROUTINE X 2)
Culture: NO GROWTH
Culture: NO GROWTH
Special Requests: ADEQUATE
Special Requests: ADEQUATE

## 2023-11-18 LAB — BASIC METABOLIC PANEL WITH GFR
Anion gap: 6 (ref 5–15)
BUN: 13 mg/dL (ref 8–23)
CO2: 23 mmol/L (ref 22–32)
Calcium: 7.5 mg/dL — ABNORMAL LOW (ref 8.9–10.3)
Chloride: 105 mmol/L (ref 98–111)
Creatinine, Ser: 0.7 mg/dL (ref 0.61–1.24)
GFR, Estimated: >60 mL/min (ref >60)
Glucose, Bld: 241 mg/dL — ABNORMAL HIGH (ref 70–99)
Potassium: 5 mmol/L (ref 3.5–5.1)
Sodium: 134 mmol/L — ABNORMAL LOW (ref 135–145)

## 2023-11-18 LAB — GLUCOSE, CAPILLARY
Glucose-Capillary: 86 mg/dL (ref 70–99)
Glucose-Capillary: 113 mg/dL — ABNORMAL HIGH (ref 70–99)
Glucose-Capillary: 121 mg/dL — ABNORMAL HIGH (ref 70–99)
Glucose-Capillary: 87 mg/dL (ref 70–99)

## 2023-11-18 LAB — PHOSPHORUS: Phosphorus: 2.6 mg/dL (ref 2.5–4.6)

## 2023-11-18 LAB — MAGNESIUM: Magnesium: 2 mg/dL (ref 1.7–2.4)

## 2023-11-18 MED ORDER — ACETAMINOPHEN 325 MG PO TABS: 650.0000 mg | ORAL_TABLET | Freq: Four times a day (QID) | ORAL | Status: DC | PRN

## 2023-11-18 MED ORDER — ALBUMIN HUMAN 5 % IV SOLN
12.5000 g | Freq: Once | INTRAVENOUS | Status: AC
  Administered 2023-11-18: 12.5 g via INTRAVENOUS
  Filled 2023-11-18: qty 250

## 2023-11-18 MED ORDER — SODIUM CHLORIDE 0.9 % IV SOLN: INTRAVENOUS | Status: DC

## 2023-11-18 MED ORDER — SODIUM CHLORIDE 0.9 % IV BOLUS
500.0000 mL | Freq: Once | INTRAVENOUS | Status: AC
  Administered 2023-11-18: 500 mL via INTRAVENOUS

## 2023-11-18 MED ORDER — ONDANSETRON HCL 4 MG/2ML IJ SOLN
4.0000 mg | Freq: Once | INTRAMUSCULAR | Status: AC
  Administered 2023-11-18: 4 mg via INTRAVENOUS
  Filled 2023-11-18: qty 2

## 2023-11-18 MED ORDER — ACETAMINOPHEN 650 MG RE SUPP
650.0000 mg | Freq: Four times a day (QID) | RECTAL | Status: DC | PRN
  Administered 2023-12-06: 650 mg via RECTAL
  Filled 2023-11-18: qty 1

## 2023-11-18 MED ORDER — SODIUM CHLORIDE 0.9% FLUSH
10.0000 mL | INTRAVENOUS | Status: DC | PRN
  Administered 2023-11-19: 20 mL

## 2023-11-18 MED ORDER — ONDANSETRON HCL 4 MG/2ML IJ SOLN
4.0000 mg | Freq: Four times a day (QID) | INTRAMUSCULAR | Status: DC | PRN
  Administered 2023-11-18 – 2023-12-21 (×8): 4 mg via INTRAVENOUS
  Filled 2023-11-18 (×8): qty 2

## 2023-11-18 MED ORDER — ERYTHROMYCIN BASE 250 MG PO TABS
250.0000 mg | ORAL_TABLET | Freq: Two times a day (BID) | ORAL | Status: DC
  Administered 2023-11-18 (×2): 250 mg via ORAL
  Filled 2023-11-18 (×4): qty 1

## 2023-11-18 MED ORDER — LEVALBUTEROL HCL 0.63 MG/3ML IN NEBU
INHALATION_SOLUTION | RESPIRATORY_TRACT | Status: AC
  Filled 2023-11-18: qty 6

## 2023-11-18 MED ORDER — LEVALBUTEROL HCL 0.63 MG/3ML IN NEBU
0.6300 mg | INHALATION_SOLUTION | Freq: Four times a day (QID) | RESPIRATORY_TRACT | Status: DC | PRN
  Administered 2023-11-18: 0.63 mg via RESPIRATORY_TRACT

## 2023-11-18 NOTE — Hospital Course (Addendum)
 73 y.o. male with medical history significant for hypertension, hyperlipidemia, paroxysmal A-fib on Eliquis , chronic anxiety/depression, polyneuropathy, multiple myeloma (diagnosed March 2025) received Bortezomib  infusion 11/09/2023. Patient was admitted here 11/02/2023 through 11/05/2023 with intractable nausea vomiting, recurrent small bowel obstruction.  He was treated conservatively with NG tube decompression and discharged home.  Last admission CT abdomen/pelvis with contrast with slight improvement in small bowel dilation when compared to prior exam but with mild transition point deep in the pelvis, no pancreatic ductal dilation, no inflammatory changes, stable free fluid within the abdomen.  General surgery was consulted and patient underwent diagnostic laparoscopy by Dr. [name] on 11/03/2023 findings of intermittently dilated small bowel with erythema of the serosa consistent with inflammation/edema, intermittent injection of the small bowel mesentery, small bowel with tendency to migrate to the left upper quadrant with long/mobile small bowel mesentery with suspicion/potential for intermittent volvulized small bowel contributing to his symptoms and findings on imaging.  Diet was slowly advanced with toleration.    This admission CT abdomen shows increasing small bowel dilatation and fecalization of bowel contents involving the jejunum. No definitive transition point is identified at this time but caliber change is noted in the right mid abdomen at the junction of the jejunum and ileum. The more distal ileum is unremarkable. Stable ground-glass opacities in the lung bases similar to that seen on prior exams.   **Interim History Has had a persistent ileus. Unfortunately lost his NG tube on 8/21 but had to be replaced by IR.  Now connected back to LIWS.  Underwent a upper GI series with small bowel follow-through and this shows contrast in the stomach with delayed gastric emptying.  Subsequently there was  contrast in the rectum suggesting no complete obstruction.  GI recommending continuing erythromycin  for now and repeating abdominal x-rays and considering EGD early next week to rule out pyloric stricture and recommending ambulation.  Assessment/Plan:   Ileus versus partial small bowel obstruction: Patient presented with nausea vomiting and diarrhea.  CT scan raise concern for ileus versus partial SBO. Patient was given symptomatic treatment.  NGT was not placed initially Patient initially started improving but then had recurrence of his nausea and vomiting.  CT scan was repeated which showed persistent small bowel dilatation with prominent jejunum as seen before. - Surgery also following, appreciate assistance and unclamped NGT - Minimal ambulation due to his weakness also -Trial of erythromycin  started on 11/18/2023 but not successful; began having large biliary emesis on 8/16 - NGT placed 8/16 -continue NPO, IVF for now -Surgery to discuss if need for further repeat laparoscopy and they do not feel like it will be beneficial -Given prolonged hospitalization with minimal to no nutrition, now warrants TPN; PICC placed 8/18 and TPN started; NGT in place and connected to LIWS but lost this AM and had to be replaced by IR and hooked back to Suction -Will be getting an UGI w/ SBFT per Surgery to assess for transit and patency and this was done and showed Contrast present in stomach with delayed gastric emptying. GI re-evaluated and they may consider endoscopy and despite this the GI physician was emphasized with the wife that they suspected motility were partially obstructing issue deep with the small bowel and do not think endoscopy will show much but without his lack of improvement extensive efforts and conservative therapies, endoscopy is not unreasonable. -KUB AM on 8/21 showed Similar pattern of gas within the stomach, small bowel and right colon. Pattern is most consistent with either ileus or  pseudo-obstruction. -Minimize narcotics. C/w Antiemetics. GI feels it may take Days/weeks for this issue to resolve -Allergic to Reglan  so will resume Erythromycin  again (IV) and increased dose to 500 mg TID  -Repeat KUB this AM showed  Minimal bowel contrast material is demonstrated. There appears to be contrast material in the rectum suggesting no evidence of complete obstruction.Gaseous distention of small bowel with gas-filled colon most likely ileus. -GI pending considering EGD early next week to rule out any pyloric stricture but no obstructive lesion seen on the upper GI small bowel follow-through.  I recommended continue erythromycin  for now continue ambulation and mobilization to try and stimulate gastric intestinal motility and avoiding narcotics.  Hypotension - Resolved w/ IVF Lactic acidosis - resolved  Leukocytosis, fluctuating  - large GI losses on 8/16 with emesis episode, then became hypotensive and lactic elevated - s/p fluid boluses overnight and started on maintenance fluid -Lactic now normalized 8/17; low suspicion for infection; this appears hypovolemia for now -s/p IVF -Continue trending WBC; Current Trend Fluctuating:  Recent Labs  Lab 11/19/23 0642 11/20/23 0244 11/21/23 0629 11/22/23 0523 11/23/23 0407 11/24/23 0401 11/25/23 0523  WBC 15.1* 14.2* 18.2* 13.8* 8.7 13.1* 7.0  -Holding off on abx at this time   Diarrhea: Reports semiformed stools.GI pathogen panel negative; Colonized with C. difficile but testing negative for active infection (antigen positive, toxin negative, PCR negative). Fecal Pouch in place   Acute Urinary Retention: Foley catheter was placed on 8/11.  Voiding trial once he is more stable and/or stronger   History of Multiple Myeloma: Followed by medical oncology, Dr. Federico.  Some of his symptoms are thought to be due to his chemotherapeutic medications.  PAF:  Eliquis  was currently on hold in case he needs surgical intervention and  remainson Heparin  gtt. Currently not on any rate limiting medications.  Home medication list reviewed.  He is on metoprolol  but only as needed. Continue to monitor on telemetry. -C/w heparin  while NPO and while off eliquis ; Continue for now given NPO  Left Fingers Discoloration:  Resolved and Improved. Noticed on 8/18 but wife states was present on 8/17 some and actually was improved on 8/18; Fingers improved 8/20. Concern was for cyanosis as fingertips were also cold. -Had PICC line placed in left arm on 8/18 however wife states discoloration was present prior; there was also some extravasation from a prior IV in the left arm as well but unclear what was transfusing as happened prior to transfer to the ICU -Left arterial Doppler ordered and shows no obstruction in the left upper extremity with triphasic waveforms; Also evaluated by vascular surgery, greatly appreciate assistance and no Surgical intervention needed. Finger discoloration has resolved as of 8/19;  Okay to discontinue neurovascular checks. Continue monitoring as necessary   Normocytic Anemia:  Hgb/Hct Trend:  Recent Labs  Lab 11/19/23 0642 11/20/23 0244 11/21/23 0629 11/22/23 0523 11/23/23 0407 11/24/23 0401 11/25/23 0523  HGB 10.1* 11.1* 10.4* 9.7* 8.8* 9.7* 8.5*  HCT 30.5* 33.3* 32.2* 29.9* 27.6* 30.6* 26.1*  MCV 93.8 95.4 95.0 94.6 95.2 95.0 94.6  -CTM for S/Sx of Bleeding; No overt bleeding noted. Stable hemoglobin noted and will need repeat CBC in the AM    Hyponatremia/Hypokalemia/Hypomagnesemia:  Recent Labs  Lab 11/19/23 0642 11/20/23 0244 11/21/23 0629 11/22/23 0523 11/23/23 0407 11/24/23 0401 11/25/23 0523  NA 131* 135 136 133* 136 132* 135  K 4.2 3.9 4.1 3.6 4.3 4.4 4.4  MG  --  2.1 1.8 1.9 2.0 1.9 2.2  -  Potassium was supplemented.  Sodium level remains intermittently low.  There could be an element of SIADH.  Continue to monitor.  Supplement magnesium .   Hypoglycemia: Probably from poor oral intake. On D5  infusion in LR @ 10 mL/hr with improvement in glucose levels. Current CBG Trend ranging from 94-122  Abnormal LFTs: IN the setting of TPN. AST is now 73 and ALT is now 64. CTM and Trend and if Necessary will obtain RUQ U/S and Acute Hepatitis Panel   Chronic Anxiety and Depression: Continue psychotropic medications once taking po Properly   Physical Deconditioning: PT and OT recommendinfg SNF when medically stable for D/C but not medically ready yet.  Hypoalbuminemia: Patient's Albumin  Trending down and went from 3.0 -> 1.6. CTM and Trend and repeat CMP in the AM:   Lower Extremity Swelling: In the setting of hypoalbuminemia but we will check a lower extremity duplex to rule out DVT and this is still pending; Will give a dose of IV Lasix  40 mg x1  Overweight: Complicates overall prognosis and care. Estimated body mass index is 25.84 kg/m as calculated from the following:   Height as of this encounter: 6' 4 (1.93 m).   Weight as of this encounter: 96.3 kg. Weight Loss and Dietary Counseling given

## 2023-11-18 NOTE — Plan of Care (Signed)
  Problem: Education: Goal: Knowledge of General Education information will improve Description: Including pain rating scale, medication(s)/side effects and non-pharmacologic comfort measures 11/18/2023 1452 by Marnette Allen Gulling, RN Outcome: Progressing 11/18/2023 1452 by Marnette Allen Gulling, RN Outcome: Progressing   Problem: Clinical Measurements: Goal: Ability to maintain clinical measurements within normal limits will improve 11/18/2023 1452 by Marnette Allen Gulling, RN Outcome: Progressing 11/18/2023 1452 by Marnette Allen Gulling, RN Outcome: Progressing Goal: Will remain free from infection 11/18/2023 1452 by Marnette Allen Gulling, RN Outcome: Progressing 11/18/2023 1452 by Marnette Allen Gulling, RN Outcome: Progressing Goal: Diagnostic test results will improve 11/18/2023 1452 by Marnette Allen Gulling, RN Outcome: Progressing 11/18/2023 1452 by Marnette Allen Gulling, RN Outcome: Progressing Goal: Respiratory complications will improve 11/18/2023 1452 by Marnette Allen Gulling, RN Outcome: Progressing 11/18/2023 1452 by Marnette Allen Gulling, RN Outcome: Progressing Goal: Cardiovascular complication will be avoided 11/18/2023 1452 by Marnette Allen Gulling, RN Outcome: Progressing 11/18/2023 1452 by Marnette Allen Gulling, RN Outcome: Progressing   Problem: Activity: Goal: Risk for activity intolerance will decrease 11/18/2023 1452 by Marnette Allen Gulling, RN Outcome: Progressing 11/18/2023 1452 by Marnette Allen Gulling, RN Outcome: Progressing   Problem: Nutrition: Goal: Adequate nutrition will be maintained 11/18/2023 1452 by Marnette Allen Gulling, RN Outcome: Progressing 11/18/2023 1452 by Marnette Allen Gulling, RN Outcome: Progressing   Problem: Coping: Goal: Level of anxiety will decrease 11/18/2023 1452 by Marnette Allen Gulling, RN Outcome: Progressing 11/18/2023 1452 by Marnette Allen Gulling,  RN Outcome: Progressing   Problem: Pain Managment: Goal: General experience of comfort will improve and/or be controlled 11/18/2023 1452 by Marnette Allen Gulling, RN Outcome: Progressing 11/18/2023 1452 by Marnette Allen Gulling, RN Outcome: Progressing   Problem: Safety: Goal: Ability to remain free from injury will improve 11/18/2023 1452 by Marnette Allen Gulling, RN Outcome: Progressing 11/18/2023 1452 by Marnette Allen Gulling, RN Outcome: Progressing   Problem: Skin Integrity: Goal: Risk for impaired skin integrity will decrease 11/18/2023 1452 by Marnette Allen Gulling, RN Outcome: Progressing 11/18/2023 1452 by Marnette Allen Gulling, RN Outcome: Progressing

## 2023-11-18 NOTE — Progress Notes (Signed)
 Progress Note    David Mcgrath   FMW:[id number]  DOB: 1951/04/03  DOA: 11/12/2023     5 PCP: [name], DO  Initial CC: Abdominal pain  Hospital Course: 73 y.o. male with medical history significant for hypertension, hyperlipidemia, paroxysmal A-fib on Eliquis , chronic anxiety/depression, polyneuropathy, multiple myeloma (diagnosed March 2025) received Bortezomib  infusion 11/09/2023. Patient was admitted here 11/02/2023 through 11/05/2023 with intractable nausea vomiting, recurrent small bowel obstruction.  He was treated conservatively with NG tube decompression and discharged home.  Last admission CT abdomen/pelvis with contrast with slight improvement in small bowel dilation when compared to prior exam but with mild transition point deep in the pelvis, no pancreatic ductal dilation, no inflammatory changes, stable free fluid within the abdomen.  General surgery was consulted and patient underwent diagnostic laparoscopy by Dr. [name] on 11/03/2023 findings of intermittently dilated small bowel with erythema of the serosa consistent with inflammation/edema, intermittent injection of the small bowel mesentery, small bowel with tendency to migrate to the left upper quadrant with long/mobile small bowel mesentery with suspicion/potential for intermittent volvulized small bowel contributing to his symptoms and findings on imaging.  Diet was slowly advanced with toleration.    This admission CT abdomen shows increasing small bowel dilatation and fecalization of bowel contents involving the jejunum. No definitive transition point is identified at this time but caliber change is noted in the right mid abdomen at the junction of the jejunum and ileum. The more distal ileum is unremarkable. Stable ground-glass opacities in the lung bases similar to that seen on prior exams.   Assessment/Plan:   Ileus versus partial small bowel obstruction Patient presented with nausea vomiting and diarrhea.  CT  scan raise concern for ileus versus partial SBO. Patient was given symptomatic treatment.  NGT was not placed.  Patient initially started improving but then had recurrence of his nausea and vomiting.  CT scan was repeated which showed persistent small bowel dilatation with prominent jejunum as seen before. 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   - GI pathogen panel negative -Colonized with C. difficile but testing negative for active infection (antigen positive, toxin negative, PCR negative)  Acute urinary retention Foley catheter was placed on 8/11.  Voiding trial once he is more stable and/or stronger   Hypotension Resolved with IV fluids   History of multiple myeloma Followed by medical oncology, Dr. Federico.  Some of his symptoms are thought to be due to his chemotherapeutic medications.   Paroxysmal atrial fibrillation on Eliquis  Eliquis  is currently on hold in case he needs surgical intervention.   Currently not on any rate limiting medications.  Home medication list reviewed.  He is on metoprolol  but only as needed. Continue to monitor on telemetry.   Normocytic anemia Stable hemoglobin noted.   Hyponatremia/hypokalemia/hypomagnesemia Potassium was supplemented.  Sodium level remains low.  There could be an element of SIADH.  Continue to monitor.  Supplement magnesium .   Hypoglycemia Probably from poor oral intake.  Started on D5 infusion with improvement in glucose levels.   Chronic anxiety and depression Continue psychotropic medications.   Physical deconditioning PT and OT.  Interval History:  No events overnight.  Wife present bedside this morning also.  Encouraged him to try and work more with therapy.  He  remains rather deconditioned. Understands treatment plan for suspected ileus.  Old records reviewed in assessment of this patient  Antimicrobials:   DVT prophylaxis:  enoxaparin  (LOVENOX ) injection 40 mg Start: 11/17/23 1400 Place and maintain sequential compression device Start: 11/16/23 1142   Code Status:   Code Status: Full Code  Mobility Assessment (Last 72 Hours)     Mobility Assessment     Row Name 11/18/23 1255 11/17/23 2221 11/17/23 1230 11/17/23 1100 11/16/23 2010   Does the patient have exclusion criteria? -- No - Perform mobility assessment No - Perform mobility assessment No - Perform mobility assessment No - Perform mobility assessment   What is the highest level of mobility based on the mobility assessment? Level 3 (Stands with assistance) - Balance while standing  and cannot march in place Level 3 (Stands with assistance) - Balance while standing  and cannot march in place Level 3 (Stands with assistance) - Balance while standing  and cannot march in place Level 3 (Stands with assistance) - Balance while standing  and cannot march in place Level 3 (Stands with assistance) - Balance while standing  and cannot march in place   Is the above level different from baseline mobility prior to current illness? -- Yes - Recommend PT order Yes - Recommend PT order Yes - Recommend PT order --    Row Name 11/16/23 1500 11/16/23 1419 11/16/23 0814 11/15/23 1953     Does the patient have exclusion criteria? -- -- No - Perform mobility assessment No - Perform mobility assessment    What is the highest level of mobility based on the mobility assessment? Level 3 (Stands with assistance) - Balance while standing  and cannot march in place Level 3 (Stands with assistance) - Balance while standing  and cannot march in place Level 3 (Stands with assistance) - Balance while standing  and cannot march in place Level 3 (Stands with assistance) - Balance while standing  and cannot march in place     Is the above level different from baseline mobility prior to current illness? -- -- Yes - Recommend PT order --       Barriers to discharge: none Disposition Plan:  Home HH orders placed: n/a Status is: Inpt  Objective: Blood pressure 139/86, pulse 87, temperature 98.3 F (36.8 C), temperature source Oral, resp. rate 18, height 6' 4 (1.93 m), weight 92.7 kg, SpO2 95%.  Examination:  Physical Exam Constitutional:      General: He is not in acute distress.    Appearance: Normal appearance.  HENT:     Head: Normocephalic and atraumatic.     Mouth/Throat:     Mouth: Mucous membranes are moist.  Eyes:     Extraocular Movements: Extraocular movements intact.  Cardiovascular:     Rate and Rhythm: Normal rate and regular rhythm.  Pulmonary:     Effort: Pulmonary effort is normal. No respiratory distress.     Breath sounds: Normal breath sounds. No wheezing.  Abdominal:     General: Bowel sounds are absent. There is no distension.     Palpations: Abdomen is soft.     Tenderness: There is no abdominal tenderness.  Musculoskeletal:        General: Normal range of motion.     Cervical back: Normal range of motion and neck supple.  Skin:    General: Skin is warm and dry.  Neurological:     General: No focal deficit present.     Mental Status:  He is alert.  Psychiatric:        Mood and Affect: Mood normal.        Behavior: Behavior normal.      Consultants:  General surgery  Procedures:    Data Reviewed: Results for orders placed or performed during the hospital encounter of 11/12/23 (from the past 24 hours)  Basic metabolic panel with GFR     Status: Abnormal   Collection Time: 11/18/23  6:07 AM  Result Value Ref Range   Sodium 134 (L) 135 - 145 mmol/L   Potassium 5.0 3.5 - 5.1 mmol/L   Chloride 105 98 - 111 mmol/L   CO2 23 22 - 32 mmol/L   Glucose, Bld 241 (H) 70 - 99 mg/dL   BUN 13 8 - 23 mg/dL   Creatinine, Ser 9.29 0.61 - 1.24 mg/dL   Calcium  7.5 (L) 8.9 - 10.3  mg/dL   GFR, Estimated >39 >39 mL/min   Anion gap 6 5 - 15  Magnesium      Status: None   Collection Time: 11/18/23  6:07 AM  Result Value Ref Range   Magnesium  2.0 1.7 - 2.4 mg/dL  Phosphorus     Status: None   Collection Time: 11/18/23  6:07 AM  Result Value Ref Range   Phosphorus 2.6 2.5 - 4.6 mg/dL  Glucose, capillary     Status: None   Collection Time: 11/18/23  7:33 AM  Result Value Ref Range   Glucose-Capillary 86 70 - 99 mg/dL    I have reviewed pertinent nursing notes, vitals, labs, and images as necessary. I have ordered labwork to follow up on as indicated.  I have reviewed the last notes from staff over past 24 hours. I have discussed patient's care plan and test results with nursing staff, CM/SW, and other staff as appropriate.  Time spent: Greater than 50% of the 55 minute visit was spent in counseling/coordination of care for the patient as laid out in the A&P.   LOS: 5 days   Alm Apo, MD [facility] 11/18/2023, 1:36 PM

## 2023-11-18 NOTE — Plan of Care (Signed)
   Problem: Education: Goal: Knowledge of General Education information will improve Description Including pain rating scale, medication(s)/side effects and non-pharmacologic comfort measures Outcome: Progressing

## 2023-11-18 NOTE — Progress Notes (Signed)
 Physical Therapy Treatment Patient Details Name: David Mcgrath MRN: [id number] DOB: [birth year] Today's Date: 11/18/2023   History of Present Illness David Mcgrath is a 73 y.o. male with  who presents to the ER with complaints of hypotension associated with cramping abdominal pain and poor oral intake for the past few days. PMH: hypertension, hyperlipidemia, paroxysmal A-fib on Eliquis , chronic anxiety/depression, polyneuropathy, multiple myeloma (diagnosed March 2025), recently admitted, from 7/31 through 11/05/2023, due to intractable nausea and vomiting from recurrent small bowel obstruction, had a diagnostic laparoscopy    PT Comments  Pt received semireclined in bed agreeable to attempt OOB mobility. Pt demonstrated bed mobility supine to sit with HOB elevated and use of bed rails SUP with extended time, sat EOB ~92min during STS attempts; STS to RW from elevated bed x3 pt able to clear buttocks maxA+2 for safety and physical assist, unable to come to upright standing; reporting dizziness, BP 109/68, returned to supine dependently and repositioned to [facility], pt fatigued with eyes closed. Updated NT recommendation of SARA STEDY +2 for transfers this date. Updated wife in hallway regarding session this date. We will continue to follow acutely.    If plan is discharge home, recommend the following: Assistance with cooking/housework;Help with stairs or ramp for entrance;A little help with walking and/or transfers;A little help with bathing/dressing/bathroom;Assist for transportation   Can travel by private vehicle        Equipment Recommendations  None recommended by PT    Recommendations for Other Services       Precautions / Restrictions Precautions Precautions: Fall Recall of Precautions/Restrictions: Intact Restrictions Weight Bearing Restrictions Per Provider Order: No     Mobility  Bed Mobility Overal bed mobility: Needs Assistance Bed Mobility: Supine to Sit, Sit  to Supine     Supine to sit: Supervision, HOB elevated, Used rails Sit to supine: Max assist   General bed mobility comments: Pt SUP with extended time, HOB elevated, and use of rails to come to upright sitting, sat EOB for ~19min during standing attempts, Reporting some dizziness, BP attempted but required 2 squeezes, eventually 109/68.    Transfers Overall transfer level: Needs assistance Equipment used: Rolling walker (2 wheels) Transfers: Sit to/from Stand Sit to Stand: From elevated surface, Max assist, +2 physical assistance, +2 safety/equipment           General transfer comment: Pt unable to come to upright standing to RW from elevated surface, attempted x3 with maxA+2, pt able to clear buttocks but unable to actually stand. Requested deferment of additional standing and return to bed. Updated NT that pt would benefit from SARA STEDY for transfers +2 this date.    Ambulation/Gait                   Stairs             Wheelchair Mobility     Tilt Bed    Modified Rankin (Stroke Patients Only)       Balance Overall balance assessment: Needs assistance Sitting-balance support: Feet supported, No upper extremity supported Sitting balance-Leahy Scale: Fair         Standing balance comment: Unable to come to upright stance due to [employer] [facility] Communication Communication: No apparent difficulties  Cognition Arousal: Alert Behavior During Therapy: WFL for tasks assessed/performed   PT -  Cognitive impairments: No apparent impairments                         Following commands: Intact      Cueing Cueing Techniques: Verbal cues  Exercises      General Comments        Pertinent Vitals/Pain Pain Assessment Pain Assessment: No/denies pain    Home Living                          Prior Function            PT Goals (current goals can now be found in the care plan  section) Acute Rehab PT Goals Patient Stated Goal: Go home and improve strength PT Goal Formulation: With patient Time For Goal Achievement: 11/27/23 Potential to Achieve Goals: Good Progress towards PT goals: Not progressing toward goals - comment (Weakness)    Frequency    Min 3X/week      PT Plan      Co-evaluation              AM-PAC PT 6 Clicks Mobility   Outcome Measure  Help needed turning from your back to your side while in a flat bed without using bedrails?: A Little Help needed moving from lying on your back to sitting on the side of a flat bed without using bedrails?: A Little Help needed moving to and from a bed to a chair (including a wheelchair)?: Total Help needed standing up from a chair using your arms (e.g., wheelchair or bedside chair)?: Total Help needed to walk in hospital room?: Total Help needed climbing 3-5 steps with a railing? : Total 6 Click Score: 10    End of Session Equipment Utilized During Treatment: Gait belt Activity Tolerance: Patient limited by fatigue Patient left: in bed;with call bell/phone within reach;with bed alarm set Nurse Communication: Mobility status;Need for lift equipment PT Visit Diagnosis: Unsteadiness on feet (R26.81);Other abnormalities of gait and mobility (R26.89);Muscle weakness (generalized) (M62.81)     Time: 8775-8749 PT Time Calculation (min) (ACUTE ONLY): 26 min  Charges:    $Therapeutic Activity: 23-37 mins PT General Charges $$ ACUTE PT VISIT: 1 Visit                     Elsie Grieves, PT, DPT [facility] Department Office: [phone]   Elsie Grieves 11/18/2023, 1:00 PM

## 2023-11-18 NOTE — Progress Notes (Addendum)
 Overnight   NAME: David Mcgrath MRN: [id number] DOB : 22-Jun-1950    Date of Service   11/18/2023   HPI/Events of Note    Notified by attending for overwatch due to concern for high potential for aspiration, potential for respiratory compromise..  Brief history 73 year old male medical history for hypertension, hyperlipidemia, paroxysmal A-fib on Eliquis , chronic anxiety/depression, polyneuropathy, multiple myeloma  (diagnosed March 2025)  Previously admitted here at [facility] 11/02/2023 through 11/05/2023 with intractable nausea and vomiting, recurrent small bowel obstruction. At that time he was treated conservatively with an NG tube decompression and discharged home. CT abdomen/pelvis with contrast with slight improvement in small bowel dilation when compared to prior exam but with mild transition point deep in the pelvis, no pancreatic ductal dilation, no inflammatory changes, stable free fluid with in the abdomen. Patient underwent diagnostic laparoscopy on 11/03/2023. Diet was slowly advanced with toleration.  Current admission CT abdomen shows increasing small bowel dilation with fecalization of bowel contents involving jejunum.  No transition point definitive but caliber changes noted in the right mid abdomen Lungs appear as previous exams.  Bedside visit Tachypneic tachycardic, distended abdomen, awake and oriented patient in respiratory distress. Portable x-ray obtained  Partial notation below  FINDINGS: Hypoventilatory changes. Ground-glass opacities at the bases without significant change. Stable cardiomediastinal silhouette. No pneumothorax. Air-filled distended bowel beneath the diaphragms.   IMPRESSION: Hypoventilatory changes with similar ground-glass opacities/infiltrate at the bases. Similar bowel distension within the upper abdomen.     Electronically Signed   By: Luke Bun M.D.   On: 11/18/2023 20:49      Interventions/ Plan    Transfer to SDU IVF bolus  Albumin   dosing O2 as needed NG Tube insertion         ================================================== Update 2303 hrs Now ICU level due to Hypotension Receiving IVF bolus and Albumin  May require pressor. Labs pending =================================================== Update 0100 hrs  11/19/23  NG Tube successfully inserted , first attempt, after expressing nausea and emesis another time. Immediately return of 850 mL of green fluid.  Pt expresses relief with this. NG Tube continues to return green fluid on Low Intermittent Suction.    Imaging in part   FINDINGS: NG tube tip in the left abdomen, likely in the mid stomach. Continued dilated bowel loops.   IMPRESSION: NG tube in the mid stomach.   Electronically Signed   By: Franky Crease M.D.   On: 11/19/2023 01:21 -------------------------------------------------------------- Labs:   Latest Reference Range & Units 11/18/23 23:23  Sodium 135 - 145 mmol/L 137  Potassium 3.5 - 5.1 mmol/L 3.2 (L)  Chloride 98 - 111 mmol/L 114 (H)  CO2 22 - 32 mmol/L 16 (L)  Glucose 70 - 99 mg/dL 82  BUN 8 - 23 mg/dL 15  Creatinine 9.38 - 8.75 mg/dL 8.89  Calcium  8.9 - 10.3 mg/dL 5.9 (LL)  Anion gap 5 - 15  7  Magnesium  1.7 - 2.4 mg/dL 1.4 (L)  GFR, Estimated >60 mL/min >60  (LL): Data is critically low (L): Data is abnormally low (H): Data is abnormally high   Latest Reference Range & Units 11/18/23 23:23  WBC 4.0 - 10.5 K/uL 9.3  RBC 4.22 - 5.81 MIL/uL 3.06 (L)  Hemoglobin 13.0 - 17.0 g/dL 9.7 (L)  HCT 60.9 - 47.9 % 30.0 (L)  MCV 80.0 - 100.0 fL 98.0  MCH 26.0 - 34.0 pg 31.7  MCHC 30.0 - 36.0 g/dL 67.6  RDW 88.4 -  15.5 % 17.3 (H)  Platelets 150 - 400 K/uL 234  nRBC 0.0 - 0.2 % 0.0  (L): Data is abnormally low (H): Data is abnormally high    Latest Reference Range & Units 11/18/23 23:22  Lactic Acid, Venous 0.5 - 1.9 mmol/L 2.9 (HH)  (HH): Data is critically high  LR Bolus - in  process. Magnesium  and Potassium replacement are ordered.- in process 0138 hrs. Patient currently resting  HR 102                                         RR  25                                    SPO2    100% on 4 LPM [place]                                          BP 108/36 Urine output still low. IVF infusing.  Calcium  Correction for Hypoalbuminemia and Hyperalbuminemia calculation is 7.4 mg/dL  Has not required pressor support at this time.  NPO Awake, pleasant. Patient is resting comfortably and states relief in general.. =============================================== Update 0640 hrs  IV Fluid bolus infusing Labs pending     Lynwood Kipper BSN MSNA MSN ACNPC-AG Acute Care Nurse Practitioner [facility]

## 2023-11-18 NOTE — Progress Notes (Signed)
 Subjective/Chief Complaint: Still feeling nauseated. Vomited some of the contrast Plain films - contrast in right colon (likely from CT scan two days ago); contrast in stomach and proximal SB.  Dilated SB Had some diarrhea yesterday   Objective: Vital signs in last 24 hours: Temp:  [97.7 F (36.5 C)-98.3 F (36.8 C)] 98.3 F (36.8 C) (08/16 0511) Pulse Rate:  [81-87] 87 (08/16 0511) Resp:  [16-18] 18 (08/16 0511) BP: (119-139)/(73-86) 139/86 (08/16 0511) SpO2:  [95 %-97 %] 95 % (08/16 0511) Last BM Date : 11/17/23  Intake/Output from previous day: 08/15 0701 - 08/16 0700 In: 1408.9 [I.V.:1408.9] Out: 600 [Urine:300; Emesis/NG output:300] Intake/Output this shift: No intake/output data recorded.  Abd - soft, mildly distended, non-tender Incisions c/d/I   Lab Results:  Recent Labs    11/16/23 0524 11/17/23 0506  WBC 11.7* 12.2*  HGB 10.9* 11.9*  HCT 33.9* 34.9*  PLT 218 244   BMET Recent Labs    11/17/23 0506 11/18/23 0607  NA 129* 134*  K 3.7 5.0  CL 99 105  CO2 20* 23  GLUCOSE 124* 241*  BUN 12 13  CREATININE 0.58* 0.70  CALCIUM  7.3* 7.5*   PT/INR No results for input(s): LABPROT, INR in the last 72 hours. ABG No results for input(s): PHART, HCO3 in the last 72 hours.  Invalid input(s): PCO2, PO2  Studies/Results: DG Abd Portable 1V-Small Bowel Obstruction Protocol-initial, 8 hr delay Result Date: 11/17/2023 CLINICAL DATA:  8 hour delay small-bowel obstruction. EXAM: PORTABLE ABDOMEN - 1 VIEW COMPARISON:  CT 11/16/2023 FINDINGS: The majority of the contrast appears to still be within the stomach. There are dilated small bowel loops in the abdomen similar to prior CT compatible with small bowel obstruction. There appears to be contrast in the right colon. No free air. IMPRESSION: Continued dilated small bowel suggesting small bowel obstruction. There appears to be some contrast within the right colon compatible with partial obstruction.  Electronically Signed   By: Franky Crease M.D.   On: 11/17/2023 23:14   CT ABDOMEN PELVIS W CONTRAST Result Date: 11/17/2023 CLINICAL DATA:  Abdominal pain EXAM: CT ABDOMEN AND PELVIS WITH CONTRAST TECHNIQUE: Multidetector CT imaging of the abdomen and pelvis was performed using the standard protocol following bolus administration of intravenous contrast. RADIATION DOSE REDUCTION: This exam was performed according to the departmental dose-optimization program which includes automated exposure control, adjustment of the mA and/or kV according to patient size and/or use of iterative reconstruction technique. CONTRAST:  OMNIPAQUE  IOHEXOL  300 MG/ML  SOLN COMPARISON:  11/13/2023 FINDINGS: Lower chest: Lung bases again demonstrate ground-glass opacities in the bases slightly increased when compared with the prior exam. Hepatobiliary: No focal liver abnormality is seen. No gallstones, gallbladder wall thickening, or biliary dilatation. Pancreas: Unremarkable. No pancreatic ductal dilatation or surrounding inflammatory changes. Spleen: Normal in size without focal abnormality. Adrenals/Urinary Tract: Adrenal glands are within normal limits. Kidneys demonstrate a normal enhancement pattern. No obstructive changes are noted. The bladder is decompressed by Foley catheter. Stomach/Bowel: Colon is decompressed. Persistent small bowel dilatation is noted involving the jejunum similar to that seen on the prior exam. Again a transition zone is noted in the right mid abdomen although no discrete mass is seen. The distal ileum is unremarkable. Stomach is dilated with ingested fluids. Vascular/Lymphatic: Aortic atherosclerosis. No enlarged abdominal or pelvic lymph nodes. Reproductive: Prostate is unremarkable. Other: Mild free fluid is noted within the pelvis. Stable from the prior exam. Musculoskeletal: No acute or significant osseous findings. IMPRESSION: Persistent  small bowel dilatation involving the jejunum similar to  that seen on the prior exam. No new focal abnormality is noted. Electronically Signed   By: Oneil Devonshire M.D.   On: 11/17/2023 03:08    Anti-infectives: Anti-infectives (From admission, onward)    Start     Dose/Rate Route Frequency Ordered Stop   11/18/23 1200  erythromycin  (E-MYCIN ) tablet 250 mg        250 mg Oral 2 times daily with meals 11/18/23 0720     11/13/23 1000  acyclovir  (ZOVIRAX ) tablet 400 mg        400 mg Oral 2 times daily 11/13/23 0503         Assessment/Plan: Ileus, impaired gastrointestinal motility - likely secondary to recent multiple myeloma treatment No radiologic or clinical signs of obstruction  Recent diagnostic laparoscopy negative for mechanical obstruction Unable to use Reglan  due to history of tardive dyskinesia Consider GI consult Will try erythromycin  PO as a motilin agonist to try to improve motility and gastric emptying No indications for surgery. Discussed with patient and his wife.  LOS: 5 days    Donnice MARLA Lima 11/18/2023

## 2023-11-19 ENCOUNTER — Inpatient Hospital Stay (HOSPITAL_COMMUNITY)

## 2023-11-19 DIAGNOSIS — K567 Ileus, unspecified: Secondary | ICD-10-CM | POA: Diagnosis not present

## 2023-11-19 DIAGNOSIS — K5669 Other partial intestinal obstruction: Secondary | ICD-10-CM | POA: Diagnosis not present

## 2023-11-19 DIAGNOSIS — R112 Nausea with vomiting, unspecified: Secondary | ICD-10-CM | POA: Diagnosis not present

## 2023-11-19 DIAGNOSIS — Z4682 Encounter for fitting and adjustment of non-vascular catheter: Secondary | ICD-10-CM | POA: Diagnosis not present

## 2023-11-19 DIAGNOSIS — R935 Abnormal findings on diagnostic imaging of other abdominal regions, including retroperitoneum: Secondary | ICD-10-CM | POA: Diagnosis not present

## 2023-11-19 DIAGNOSIS — R110 Nausea: Secondary | ICD-10-CM | POA: Diagnosis not present

## 2023-11-19 LAB — BASIC METABOLIC PANEL WITH GFR
Anion gap: 7 (ref 5–15)
Anion gap: 8 (ref 5–15)
BUN: 15 mg/dL (ref 8–23)
BUN: 20 mg/dL (ref 8–23)
CO2: 16 mmol/L — ABNORMAL LOW (ref 22–32)
CO2: 20 mmol/L — ABNORMAL LOW (ref 22–32)
Calcium: 5.9 mg/dL — CL (ref 8.9–10.3)
Calcium: 7.5 mg/dL — ABNORMAL LOW (ref 8.9–10.3)
Chloride: 114 mmol/L — ABNORMAL HIGH (ref 98–111)
Chloride: 103 mmol/L (ref 98–111)
Creatinine, Ser: 1.1 mg/dL (ref 0.61–1.24)
Creatinine, Ser: 1.53 mg/dL — ABNORMAL HIGH (ref 0.61–1.24)
GFR, Estimated: >60 mL/min (ref >60)
GFR, Estimated: 48 mL/min — ABNORMAL LOW (ref >60)
Glucose, Bld: 82 mg/dL (ref 70–99)
Glucose, Bld: 94 mg/dL (ref 70–99)
Potassium: 3.2 mmol/L — ABNORMAL LOW (ref 3.5–5.1)
Potassium: 4.2 mmol/L (ref 3.5–5.1)
Sodium: 137 mmol/L (ref 135–145)
Sodium: 131 mmol/L — ABNORMAL LOW (ref 135–145)

## 2023-11-19 LAB — GLUCOSE, CAPILLARY
Glucose-Capillary: 59 mg/dL — ABNORMAL LOW (ref 70–99)
Glucose-Capillary: 113 mg/dL — ABNORMAL HIGH (ref 70–99)
Glucose-Capillary: 57 mg/dL — ABNORMAL LOW (ref 70–99)
Glucose-Capillary: 87 mg/dL (ref 70–99)

## 2023-11-19 LAB — LACTIC ACID, PLASMA
Lactic Acid, Venous: 2.9 mmol/L (ref 0.5–1.9)
Lactic Acid, Venous: 3.7 mmol/L (ref 0.5–1.9)
Lactic Acid, Venous: 1.4 mmol/L (ref 0.5–1.9)

## 2023-11-19 LAB — MAGNESIUM: Magnesium: 1.4 mg/dL — ABNORMAL LOW (ref 1.7–2.4)

## 2023-11-19 LAB — CBC
HCT: 30 % — ABNORMAL LOW (ref 39.0–52.0)
HCT: 30.5 % — ABNORMAL LOW (ref 39.0–52.0)
Hemoglobin: 9.7 g/dL — ABNORMAL LOW (ref 13.0–17.0)
Hemoglobin: 10.1 g/dL — ABNORMAL LOW (ref 13.0–17.0)
MCH: 31.7 pg (ref 26.0–34.0)
MCH: 31.1 pg (ref 26.0–34.0)
MCHC: 32.3 g/dL (ref 30.0–36.0)
MCHC: 33.1 g/dL (ref 30.0–36.0)
MCV: 98 fL (ref 80.0–100.0)
MCV: 93.8 fL (ref 80.0–100.0)
Platelets: 234 K/uL (ref 150–400)
Platelets: 223 K/uL (ref 150–400)
RBC: 3.06 MIL/uL — ABNORMAL LOW (ref 4.22–5.81)
RBC: 3.25 MIL/uL — ABNORMAL LOW (ref 4.22–5.81)
RDW: 17.3 % — ABNORMAL HIGH (ref 11.5–15.5)
RDW: 17.3 % — ABNORMAL HIGH (ref 11.5–15.5)
WBC: 9.3 K/uL (ref 4.0–10.5)
WBC: 15.1 K/uL — ABNORMAL HIGH (ref 4.0–10.5)
nRBC: 0 % (ref 0.0–0.2)
nRBC: 0 % (ref 0.0–0.2)

## 2023-11-19 LAB — MRSA NEXT GEN BY PCR, NASAL: MRSA by PCR Next Gen: NOT DETECTED

## 2023-11-19 MED ORDER — DEXTROSE IN LACTATED RINGERS 5 % IV SOLN: INTRAVENOUS | Status: DC

## 2023-11-19 MED ORDER — MORPHINE SULFATE (PF) 2 MG/ML IV SOLN
2.0000 mg | INTRAVENOUS | Status: DC | PRN
  Administered 2023-11-23 – 2023-12-01 (×2): 2 mg via INTRAVENOUS
  Filled 2023-11-19 (×2): qty 1

## 2023-11-19 MED ORDER — MAGNESIUM SULFATE 2 GM/50ML IV SOLN
2.0000 g | Freq: Once | INTRAVENOUS | Status: AC
  Administered 2023-11-19: 2 g via INTRAVENOUS
  Filled 2023-11-19: qty 50

## 2023-11-19 MED ORDER — PHENOL 1.4 % MT LIQD
1.0000 | OROMUCOSAL | Status: DC | PRN
  Administered 2023-12-15: 1 via OROMUCOSAL
  Filled 2023-11-19: qty 177

## 2023-11-19 MED ORDER — POTASSIUM CHLORIDE 10 MEQ/100ML IV SOLN
10.0000 meq | INTRAVENOUS | Status: AC
  Administered 2023-11-19 (×3): 10 meq via INTRAVENOUS
  Filled 2023-11-19 (×3): qty 100

## 2023-11-19 MED ORDER — DIPHENHYDRAMINE HCL 50 MG/ML IJ SOLN
12.5000 mg | Freq: Three times a day (TID) | INTRAMUSCULAR | Status: DC | PRN
  Administered 2023-11-19: 12.5 mg via INTRAVENOUS
  Filled 2023-11-19: qty 1

## 2023-11-19 MED ORDER — DEXTROSE 50 % IV SOLN: 12.5000 g | INTRAVENOUS | Status: AC

## 2023-11-19 MED ORDER — LACTATED RINGERS IV BOLUS
500.0000 mL | Freq: Once | INTRAVENOUS | Status: AC
  Administered 2023-11-19: 500 mL via INTRAVENOUS

## 2023-11-19 MED ORDER — DEXTROSE 50 % IV SOLN
INTRAVENOUS | Status: AC
  Administered 2023-11-19: 12.5 g via INTRAVENOUS
  Filled 2023-11-19: qty 50

## 2023-11-19 NOTE — Plan of Care (Signed)
  Problem: Education: Goal: Knowledge of General Education information will improve Description: Including pain rating scale, medication(s)/side effects and non-pharmacologic comfort measures Outcome: Progressing   Problem: Clinical Measurements: Goal: Will remain free from infection Outcome: Not Progressing Goal: Diagnostic test results will improve Outcome: Not Progressing   Problem: Activity: Goal: Risk for activity intolerance will decrease Outcome: Not Progressing   Problem: Nutrition: Goal: Adequate nutrition will be maintained Outcome: Not Progressing

## 2023-11-19 NOTE — Consult Note (Signed)
 [facility] Consult  Referring Provider: No ref. provider found Primary Care Physician:  [name], DO Primary Gastroenterologist: [facility] - Dr. Dianna  Reason for Consultation: Recurrent ileus  SUBJECTIVE:   HPI: David Mcgrath is a 73 y.o. male with past medical history significant for multiple myeloma undergoing treatment through [facility]. Previously treated with lenalinomide, though no further given side effects of abdominal pain, nausea, vomiting. He is currently on Bortezomib  for multiple myeloma. Other medical history significant for hypertension, hyperlipidemia, paroxysmal atrial fibrillation and hepatic steatosis.    He was admitted from 10/14/23-10/20/23 for symptoms related to small bowel obstruction (transition point suspected in the right hemi-abdomen on CT imaging) and received non-operative management. Admitted 11/02/23 - 11/05/2023 for small bowel dilatation/concern for small bowel obstruction. CT imaging showed persistent small bowel dilatation with mild transition point noted deep in pelvis. He went to OR 11/03/23, findings were suspicious for intermittent volvulus of small bowel to explain his symptoms and CT imaging. Received Bortezomib  therapy on 11/09/23. Presented to ER on 11/12/23 with chief complaint of nausea and diarrhea. CT scan of abdomen and pelvis showed dilatation of the duodenum and jejunum increased from prior, mild fecalization of bowel contents within right mid abdomen. He has had NG tube placed, currently to suction with bilious contents.  Colonoscopy 03/21/23 (Dr. Dianna) showed diverticulosis, internal hemorrhoids.   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  Patient reports he does not have symptoms as of  02/25/14.     Diarrhea 06/04/2023   Essential hypertension 06/04/2023   Fatty liver 06/04/2023   Flu 06/04/2023   Hyperlipidemia 06/04/2023   Ileus (HCC) 06/04/2023   Multiple myeloma (HCC)    Neuropathy    Paroxysmal atrial fibrillation (HCC) 07/04/2023   Pneumonia 06/04/2023   Past Surgical History:  Procedure Laterality Date   IR BONE MARROW BIOPSY & ASPIRATION  07/31/2023   LAPAROSCOPY N/A 11/03/2023   Procedure: LAPAROSCOPY, DIAGNOSTIC;  Surgeon: [name] Mitzie LABOR, MD;  Location: [facility] ORS;  Service: General;  Laterality: N/A;  POSSIBLE EX LAP   NO PAST SURGERIES     Prior to Admission medications   Medication Sig Start Date End Date Taking? Authorizing Provider  allopurinol  (ZYLOPRIM ) 300 MG tablet Take 300 mg by mouth daily.   Yes [provider]  apixaban  (ELIQUIS ) 5 MG TABS tablet Take 1 tablet (5 mg total) by mouth 2 (two) times daily. 07/04/23  Yes Revankar, Jennifer SAUNDERS, MD  calcium -vitamin D  (OSCAL WITH D) 500-5 MG-MCG tablet Take 1 tablet by mouth 2 (two) times daily. 09/21/23  Yes Federico Norleen ONEIDA MADISON, MD  Cholecalciferol  (VITAMIN D -3) 125 MCG (5000 UT) TABS Take 5,000 Units by mouth daily.   Yes [provider]  Cyanocobalamin  (VITAMIN B12) 1000 MCG TBCR Take 1,000 mcg by mouth daily.   Yes [provider]  dexamethasone  (DECADRON ) 4 MG tablet Take 40 mg by mouth once a week on day of Velcade  injection. (10 tablets) 09/07/23  Yes Dorsey, Aeric T IV, MD  diphenoxylate -atropine  (LOMOTIL ) 2.5-0.025 MG tablet Take 1 tablet by mouth 4 (four) times daily as needed for diarrhea or loose stools. 08/23/23  Yes Thayil, Irene T, PA-C  ezetimibe  (ZETIA ) 10 MG tablet Take 10 mg by mouth every evening. 06/01/19  Yes [provider]  FLUoxetine  (PROZAC ) 20 MG capsule Take 20 mg by mouth daily.   Yes [provider]  fluticasone  (FLONASE ) 50 MCG/ACT nasal spray Place 1 spray into both nostrils daily as needed for allergies or rhinitis.   Yes [provider]  gabapentin  (NEURONTIN ) 300 MG capsule Take 2 capsules (600 mg total) by mouth 2 (two) times daily. 06/15/23  Yes Briana Elgin LABOR, MD  guaiFENesin  (MUCINEX ) 600 MG 12 hr tablet Take 600 mg by mouth 2 (two) times daily as needed for cough or to loosen phlegm.   Yes [provider]  melatonin 5 MG TABS Take 5 mg by mouth at bedtime.   Yes [provider]  metoprolol  tartrate (LOPRESSOR ) 25 MG tablet Take 0.5 tablets (12.5 mg total) by mouth daily as needed (For persistent palpitation). 10/20/23  Yes Dahal, Chapman, MD  omeprazole (PRILOSEC) 40 MG capsule Take 40 mg by mouth daily.   Yes [provider]  potassium chloride  SA (KLOR-CON  M) 20 MEQ tablet Take 1 tablet (20 mEq total) by mouth 2 (two) times daily. Patient taking differently: Take 20 mEq by mouth daily. 09/28/23  Yes Federico Norleen ONEIDA MADISON, MD  pravastatin  (PRAVACHOL ) 80 MG tablet Take 80 mg by mouth at bedtime.   Yes [provider]  pyridoxine  (B-6) 100 MG tablet Take 100 mg by mouth daily.   Yes [provider]   Current Facility-Administered Medications  Medication Dose Route Frequency Provider Last Rate Last Admin   acetaminophen  (TYLENOL ) tablet 650 mg  650 mg Oral Q6H PRN Daniels, James K, NP       Or   acetaminophen  (TYLENOL ) suppository 650 mg  650 mg Rectal Q6H PRN Daniels, James K, NP       Chlorhexidine  Gluconate Cloth 2 % PADS 6 each  6 each Topical Daily Will Almarie MATSU, MD   6 each at 11/18/23 1100   dextrose  5 % in lactated ringers  infusion   Intravenous Continuous Patsy Lenis, MD 75 mL/hr at 11/19/23 0821 Infusion Verify at 11/19/23 9178   enoxaparin  (LOVENOX ) injection 40 mg  40 mg Subcutaneous Q24H Krishnan, Gokul, MD   40 mg at 11/18/23 1559   fluticasone  (FLONASE ) 50 MCG/ACT nasal spray 1 spray  1 spray Each Nare Daily Will Almarie MATSU, MD   1 spray at 11/19/23 0820   levalbuterol  (XOPENEX ) nebulizer solution 0.63 mg  0.63 mg Nebulization Q6H PRN Daniels, James K, NP    0.63 mg at 11/18/23 2046   ondansetron  (ZOFRAN ) injection 4 mg  4 mg Intravenous Q6H PRN Patsy Lenis, MD   4 mg at 11/18/23 2009   Oral care mouth rinse  15 mL Mouth Rinse PRN Will Almarie MATSU, MD       phenol (CHLORASEPTIC) mouth spray 1 spray  1 spray Mouth/Throat PRN Patsy Lenis, MD       prochlorperazine  (COMPAZINE ) injection 5 mg  5 mg Intravenous Q6H PRN Shona Laurence N, DO   5 mg at 11/18/23 1608   sodium chloride  flush (NS) 0.9 % injection 10-40 mL  10-40 mL Intracatheter PRN Patsy Lenis, MD       Allergies as of 11/12/2023 - Review Complete 11/12/2023  Allergen Reaction Noted   Shrimp [shellfish allergy] Anaphylaxis and Swelling 01/09/2020   Reglan  [metoclopramide ] Other (See Comments) 11/03/2023   Family History  Problem Relation Age of Onset   Diabetes Mother    Cancer Mother    Heart disease Mother    Cancer  Father    Hyperlipidemia Father    Neuropathy Paternal Uncle    Migraines Neg Hx    Social History   Socioeconomic History   Marital status: Married    Spouse name: Almarie   Number of children: 0   Years of education: Not on file   Highest education level: Not on file  Occupational History   Occupation: Event organiser  Tobacco Use   Smoking status: Never   Smokeless tobacco: Never  Substance and Sexual Activity   Alcohol  use: Yes    Comment: socialy   Drug use: No   Sexual activity: Not on file  Other Topics Concern   Not on file  Social History Narrative   Lives with spouse   Right handed   Drinks 4+ cups of caffeine daily   Retired    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Corporate investment banker Strain: Not on file  Food Insecurity: No Food Insecurity (11/13/2023)   Hunger Vital Sign    Worried About Running Out of Food in the Last Year: Never true    Ran Out of Food in the Last Year: Never true  Transportation Needs: No Transportation Needs (11/13/2023)   PRAPARE - Administrator, Civil Service (Medical): No    Lack of Transportation  (Non-Medical): No  Physical Activity: Not on file  Stress: Not on file  Social Connections: Socially Integrated (11/13/2023)   Social Connection and Isolation Panel    Frequency of Communication with Friends and Family: More than three times a week    Frequency of Social Gatherings with Friends and Family: Twice a week    Attends Religious Services: 1 to 4 times per year    Active Member of [employer] or Organizations: Yes    Attends Engineer, structural: More than 4 times per year    Marital Status: Married  Catering manager Violence: Not At Risk (11/13/2023)   Humiliation, Afraid, Rape, and Kick questionnaire    Fear of Current or Ex-Partner: No    Emotionally Abused: No    Physically Abused: No    Sexually Abused: No   Review of Systems:  Review of Systems  Respiratory:  Negative for shortness of breath.   Cardiovascular:  Negative for chest pain.  Gastrointestinal:  Negative for abdominal pain, nausea and vomiting.    OBJECTIVE:   Temp:  [97.7 F (36.5 C)-100.6 F (38.1 C)] 97.7 F (36.5 C) (08/17 1351) Pulse Rate:  [41-133] 87 (08/17 0700) Resp:  [16-37] 16 (08/17 0700) BP: (71-139)/(18-64) 101/41 (08/17 0700) SpO2:  [73 %-100 %] 100 % (08/17 0700) Weight:  [94.6 kg] 94.6 kg (08/16 2224) Last BM Date : 11/16/23 Physical Exam Constitutional:      General: He is not in acute distress.    Appearance: He is not ill-appearing, toxic-appearing or diaphoretic.  Cardiovascular:     Rate and Rhythm: Normal rate.     Comments: Multiple PVCs. Pulmonary:     Effort: No respiratory distress.     Breath sounds: Normal breath sounds.     Comments: Nasal cannula 5 liters. Abdominal:     General: Bowel sounds are normal. There is no distension.     Palpations: Abdomen is soft.     Tenderness: There is no abdominal tenderness. There is no guarding.  Neurological:     Mental Status: He is alert.     Labs: Recent Labs    11/17/23 0506 11/18/23 2323 11/19/23 0642  WBC  12.2* 9.3 15.1*  HGB 11.9* 9.7* 10.1*  HCT 34.9* 30.0* 30.5*  PLT 244 234 223   BMET Recent Labs    11/18/23 0607 11/18/23 2323 11/19/23 0642  NA 134* 137 131*  K 5.0 3.2* 4.2  CL 105 114* 103  CO2 23 16* 20*  GLUCOSE 241* 82 94  BUN 13 15 20   CREATININE 0.70 1.10 1.53*  CALCIUM  7.5* 5.9* 7.5*   LFT Recent Labs    11/17/23 0506  PROT 4.8*  ALBUMIN  2.1*  AST 22  ALT 22  ALKPHOS 56  BILITOT 0.9   PT/INR No results for input(s): LABPROT, INR in the last 72 hours.  Diagnostic imaging: DG Abd 1 View Result Date: 11/19/2023 CLINICAL DATA:  NG tube placement EXAM: ABDOMEN - 1 VIEW COMPARISON:  11/17/2023 FINDINGS: NG tube tip in the left abdomen, likely in the mid stomach. Continued dilated bowel loops. IMPRESSION: NG tube in the mid stomach. Electronically Signed   By: Franky Crease M.D.   On: 11/19/2023 01:21   DG CHEST PORT 1 VIEW Result Date: 11/18/2023 CLINICAL DATA:  Respiratory failure EXAM: PORTABLE CHEST 1 VIEW COMPARISON:  11/12/2023, 10/14/2023, CT 06/08/2023 FINDINGS: Hypoventilatory changes. Ground-glass opacities at the bases without significant change. Stable cardiomediastinal silhouette. No pneumothorax. Air-filled distended bowel beneath the diaphragms. IMPRESSION: Hypoventilatory changes with similar ground-glass opacities/infiltrate at the bases. Similar bowel distension within the upper abdomen. Electronically Signed   By: Luke Bun M.D.   On: 11/18/2023 20:49   DG Abd Portable 1V-Small Bowel Obstruction Protocol-initial, 8 hr delay Result Date: 11/17/2023 CLINICAL DATA:  8 hour delay small-bowel obstruction. EXAM: PORTABLE ABDOMEN - 1 VIEW COMPARISON:  CT 11/16/2023 FINDINGS: The majority of the contrast appears to still be within the stomach. There are dilated small bowel loops in the abdomen similar to prior CT compatible with small bowel obstruction. There appears to be contrast in the right colon. No free air. IMPRESSION: Continued dilated small  bowel suggesting small bowel obstruction. There appears to be some contrast within the right colon compatible with partial obstruction. Electronically Signed   By: Franky Crease M.D.   On: 11/17/2023 23:14   IMPRESSION: Ileus, etiology undetermined  -Question intermittent intussusception based on OR findings 11/03/23 Small bowel dilatation Nausea from above Multiple myeloma on Bortezomib   History diverticulosis Paroxysmal atrial fibrillation (on Eliquis )  PLAN: -Intermittent nausea/diarrhea, symptoms correlate with small bowel dilatation/ileus/concern for intermittent obstruction -Agree with General Surgery evaluation that Bortezomib  may be contributing to abdominal symptoms as can be associated with paralytic ileus (although rare), note that Oncology has discontinued treatment pathway with lenalinomide/dexamethosone/bortezomib   -Agree to maintain electrolytes in appropriate range (mag, K) -No specific medication therapy for bowel motility  -Continue supportive care with NG tube, monitor bowel movements  -[facility] will be available as needed   LOS: 6 days   Estefana Keas, [facility]

## 2023-11-19 NOTE — Progress Notes (Signed)
 Progress Note    David Mcgrath   FMW:[id number]  DOB: Jan 15, 1951  DOA: 11/12/2023     6 PCP: [name], DO  Initial CC: Abdominal pain  Hospital Course: 73 y.o. male with medical history significant for hypertension, hyperlipidemia, paroxysmal A-fib on Eliquis , chronic anxiety/depression, polyneuropathy, multiple myeloma (diagnosed March 2025) received Bortezomib  infusion 11/09/2023. Patient was admitted here 11/02/2023 through 11/05/2023 with intractable nausea vomiting, recurrent small bowel obstruction.  He was treated conservatively with NG tube decompression and discharged home.  Last admission CT abdomen/pelvis with contrast with slight improvement in small bowel dilation when compared to prior exam but with mild transition point deep in the pelvis, no pancreatic ductal dilation, no inflammatory changes, stable free fluid within the abdomen.  General surgery was consulted and patient underwent diagnostic laparoscopy by Dr. [name] on 11/03/2023 findings of intermittently dilated small bowel with erythema of the serosa consistent with inflammation/edema, intermittent injection of the small bowel mesentery, small bowel with tendency to migrate to the left upper quadrant with long/mobile small bowel mesentery with suspicion/potential for intermittent volvulized small bowel contributing to his symptoms and findings on imaging.  Diet was slowly advanced with toleration.    This admission CT abdomen shows increasing small bowel dilatation and fecalization of bowel contents involving the jejunum. No definitive transition point is identified at this time but caliber change is noted in the right mid abdomen at the junction of the jejunum and ileum. The more distal ileum is unremarkable. Stable ground-glass opacities in the lung bases similar to that seen on prior exams.   Assessment/Plan:   Ileus versus partial small bowel obstruction Patient presented with nausea vomiting and diarrhea.  CT  scan raise concern for ileus versus partial SBO. Patient was given symptomatic treatment.  NGT was not placed initially Patient initially started improving but then had recurrence of his nausea and vomiting.  CT scan was repeated which showed persistent small bowel dilatation with prominent jejunum as seen before. - Surgery also following, appreciate assistance - Minimal ambulation due to his weakness also -Trial of erythromycin  started on 11/18/2023 but not successful; began having large biliary emesis on 8/16 - NGT placed 8/16 - continue NPO, IVF for now - GI consulted for further rec's - given prolonged hospitalization, may need to start considering TPN in the next 1-2 days   Hypotension Lactic acidosis - large GI losses on 8/16 with emesis episode, then became hypotensive and lactic elevated - s/p fluid boluses overnight and started on maintenance fluid -Lactic now normalized this morning; low suspicion for infection; this appears hypovolemia for now - Continue on fluids -Transferred to the ICU overnight, continue   Diarrhea Reports semiformed stools.   - GI pathogen panel negative -Colonized with C. difficile but testing negative for active infection (antigen positive, toxin negative, PCR negative)  Acute urinary retention Foley catheter was placed on 8/11.  Voiding trial once he is more stable and/or stronger   Hypotension Resolved with IV fluids   History of multiple myeloma Followed by medical oncology, Dr. Federico.  Some of his symptoms are thought to be due to his chemotherapeutic medications.   Paroxysmal atrial fibrillation on Eliquis  Eliquis  is currently on hold in case he needs surgical intervention.   Currently not on any rate limiting medications.  Home medication list reviewed.  He is on metoprolol  but only as needed. Continue to monitor on telemetry.   Normocytic anemia Stable hemoglobin noted.   Hyponatremia/hypokalemia/hypomagnesemia Potassium was  supplemented.  Sodium level  remains low.  There could be an element of SIADH.  Continue to monitor.  Supplement magnesium .   Hypoglycemia Probably from poor oral intake.  Started on D5 infusion with improvement in glucose levels.   Chronic anxiety and depression Continue psychotropic medications.   Physical deconditioning PT and OT.  Interval History:  Developed large episode of bilious emesis yesterday evening requiring transfer to ICU due to concern for aspiration and potential decline.  He was treated with fluids and NG tube was placed.  Vitals improved after.  Old records reviewed in assessment of this patient  Antimicrobials:   DVT prophylaxis:  enoxaparin  (LOVENOX ) injection 40 mg Start: 11/17/23 1400 Place and maintain sequential compression device Start: 11/16/23 1142   Code Status:   Code Status: Full Code  Mobility Assessment (Last 72 Hours)     Mobility Assessment     Row Name 11/18/23 1255 11/18/23 0815 11/17/23 2221 11/17/23 1230 11/17/23 1100   Does the patient have exclusion criteria? -- No - Perform mobility assessment No - Perform mobility assessment No - Perform mobility assessment No - Perform mobility assessment   What is the highest level of mobility based on the mobility assessment? Level 3 (Stands with assistance) - Balance while standing  and cannot march in place Level 3 (Stands with assistance) - Balance while standing  and cannot march in place Level 3 (Stands with assistance) - Balance while standing  and cannot march in place Level 3 (Stands with assistance) - Balance while standing  and cannot march in place Level 3 (Stands with assistance) - Balance while standing  and cannot march in place   Is the above level different from baseline mobility prior to current illness? -- Yes - Recommend PT order Yes - Recommend PT order Yes - Recommend PT order Yes - Recommend PT order    Row Name 11/16/23 2010 11/16/23 1500 11/16/23 1419       Does the patient  have exclusion criteria? No - Perform mobility assessment -- --     What is the highest level of mobility based on the mobility assessment? Level 3 (Stands with assistance) - Balance while standing  and cannot march in place Level 3 (Stands with assistance) - Balance while standing  and cannot march in place Level 3 (Stands with assistance) - Balance while standing  and cannot march in place        Barriers to discharge: none Disposition Plan:  Home HH orders placed: n/a Status is: Inpt  Objective: Blood pressure (!) 101/41, pulse 87, temperature 98.2 F (36.8 C), temperature source Axillary, resp. rate 16, height 6' 4 (1.93 m), weight 94.6 kg, SpO2 100%.  Examination:  Physical Exam Constitutional:      General: He is not in acute distress.    Appearance: Normal appearance.  HENT:     Head: Normocephalic and atraumatic.     Comments: NG tube now in place    Mouth/Throat:     Mouth: Mucous membranes are moist.  Eyes:     Extraocular Movements: Extraocular movements intact.  Cardiovascular:     Rate and Rhythm: Normal rate and regular rhythm.  Pulmonary:     Effort: Pulmonary effort is normal. No respiratory distress.     Breath sounds: Normal breath sounds. No wheezing.  Abdominal:     General: Bowel sounds are absent. There is no distension.     Palpations: Abdomen is soft.     Tenderness: There is no abdominal tenderness.  Musculoskeletal:  General: Normal range of motion.     Cervical back: Normal range of motion and neck supple.  Skin:    General: Skin is warm and dry.  Neurological:     General: No focal deficit present.     Mental Status: He is alert.  Psychiatric:        Mood and Affect: Mood normal.        Behavior: Behavior normal.      Consultants:  General surgery GI  Procedures:    Data Reviewed: Results for orders placed or performed during the hospital encounter of 11/12/23 (from the past 24 hours)  Glucose, capillary     Status: Abnormal    Collection Time: 11/18/23  1:49 PM  Result Value Ref Range   Glucose-Capillary 113 (H) 70 - 99 mg/dL  Glucose, capillary     Status: Abnormal   Collection Time: 11/18/23  4:13 PM  Result Value Ref Range   Glucose-Capillary 121 (H) 70 - 99 mg/dL  Glucose, capillary     Status: None   Collection Time: 11/18/23 10:32 PM  Result Value Ref Range   Glucose-Capillary 87 70 - 99 mg/dL   Comment 1 Notify RN    Comment 2 Document in Chart   Lactic acid, plasma     Status: Abnormal   Collection Time: 11/18/23 11:22 PM  Result Value Ref Range   Lactic Acid, Venous 2.9 (HH) 0.5 - 1.9 mmol/L  CBC     Status: Abnormal   Collection Time: 11/18/23 11:23 PM  Result Value Ref Range   WBC 9.3 4.0 - 10.5 K/uL   RBC 3.06 (L) 4.22 - 5.81 MIL/uL   Hemoglobin 9.7 (L) 13.0 - 17.0 g/dL   HCT 69.9 (L) 60.9 - 47.9 %   MCV 98.0 80.0 - 100.0 fL   MCH 31.7 26.0 - 34.0 pg   MCHC 32.3 30.0 - 36.0 g/dL   RDW 82.6 (H) 88.4 - 84.4 %   Platelets 234 150 - 400 K/uL   nRBC 0.0 0.0 - 0.2 %  Basic metabolic panel     Status: Abnormal   Collection Time: 11/18/23 11:23 PM  Result Value Ref Range   Sodium 137 135 - 145 mmol/L   Potassium 3.2 (L) 3.5 - 5.1 mmol/L   Chloride 114 (H) 98 - 111 mmol/L   CO2 16 (L) 22 - 32 mmol/L   Glucose, Bld 82 70 - 99 mg/dL   BUN 15 8 - 23 mg/dL   Creatinine, Ser 8.89 0.61 - 1.24 mg/dL   Calcium  5.9 (LL) 8.9 - 10.3 mg/dL   GFR, Estimated >39 >39 mL/min   Anion gap 7 5 - 15  Magnesium      Status: Abnormal   Collection Time: 11/18/23 11:23 PM  Result Value Ref Range   Magnesium  1.4 (L) 1.7 - 2.4 mg/dL  MRSA Next Gen by PCR, Nasal     Status: None   Collection Time: 11/19/23 12:11 AM   Specimen: Nasal Mucosa; Nasal Swab  Result Value Ref Range   MRSA by PCR Next Gen NOT DETECTED NOT DETECTED  Lactic acid, plasma     Status: Abnormal   Collection Time: 11/19/23  3:01 AM  Result Value Ref Range   Lactic Acid, Venous 3.7 (HH) 0.5 - 1.9 mmol/L  Basic metabolic panel     Status:  Abnormal   Collection Time: 11/19/23  6:42 AM  Result Value Ref Range   Sodium 131 (L) 135 - 145 mmol/L   Potassium  4.2 3.5 - 5.1 mmol/L   Chloride 103 98 - 111 mmol/L   CO2 20 (L) 22 - 32 mmol/L   Glucose, Bld 94 70 - 99 mg/dL   BUN 20 8 - 23 mg/dL   Creatinine, Ser 8.46 (H) 0.61 - 1.24 mg/dL   Calcium  7.5 (L) 8.9 - 10.3 mg/dL   GFR, Estimated 48 (L) >60 mL/min   Anion gap 8 5 - 15  CBC     Status: Abnormal   Collection Time: 11/19/23  6:42 AM  Result Value Ref Range   WBC 15.1 (H) 4.0 - 10.5 K/uL   RBC 3.25 (L) 4.22 - 5.81 MIL/uL   Hemoglobin 10.1 (L) 13.0 - 17.0 g/dL   HCT 69.4 (L) 60.9 - 47.9 %   MCV 93.8 80.0 - 100.0 fL   MCH 31.1 26.0 - 34.0 pg   MCHC 33.1 30.0 - 36.0 g/dL   RDW 82.6 (H) 88.4 - 84.4 %   Platelets 223 150 - 400 K/uL   nRBC 0.0 0.0 - 0.2 %  Glucose, capillary     Status: Abnormal   Collection Time: 11/19/23  8:03 AM  Result Value Ref Range   Glucose-Capillary 59 (L) 70 - 99 mg/dL   Comment 1 Notify RN    Comment 2 Document in Chart   Glucose, capillary     Status: Abnormal   Collection Time: 11/19/23  8:35 AM  Result Value Ref Range   Glucose-Capillary 57 (L) 70 - 99 mg/dL  Lactic acid, plasma     Status: None   Collection Time: 11/19/23  8:57 AM  Result Value Ref Range   Lactic Acid, Venous 1.4 0.5 - 1.9 mmol/L  Glucose, capillary     Status: Abnormal   Collection Time: 11/19/23  8:59 AM  Result Value Ref Range   Glucose-Capillary 113 (H) 70 - 99 mg/dL  Glucose, capillary     Status: None   Collection Time: 11/19/23 11:59 AM  Result Value Ref Range   Glucose-Capillary 87 70 - 99 mg/dL    I have reviewed pertinent nursing notes, vitals, labs, and images as necessary. I have ordered labwork to follow up on as indicated.  I have reviewed the last notes from staff over past 24 hours. I have discussed patient's care plan and test results with nursing staff, CM/SW, and other staff as appropriate.  Time spent: Greater than 50% of the 55 minute  visit was spent in counseling/coordination of care for the patient as laid out in the A&P.   LOS: 6 days   Alm Apo, MD [facility] 11/19/2023, 1:03 PM

## 2023-11-19 NOTE — Progress Notes (Signed)
 Subjective/Chief Complaint: Did not do well yesterday.  Had to have NG reinserted.     Objective: Vital signs in last 24 hours: Temp:  [98 F (36.7 C)-100.6 F (38.1 C)] 99.9 F (37.7 C) (08/17 0014) Pulse Rate:  [41-133] 86 (08/17 0630) Resp:  [16-37] 16 (08/17 0630) BP: (71-139)/(18-64) 100/46 (08/17 0630) SpO2:  [73 %-100 %] 100 % (08/17 0630) Weight:  [94.6 kg] 94.6 kg (08/16 2224) Last BM Date : 11/16/23  Intake/Output from previous day: 08/16 0701 - 08/17 0700 In: -  Out: 1575 [Urine:25; Emesis/NG output:1550] Intake/Output this shift: No intake/output data recorded.  Abd - soft, mildly distended, non-tender Incisions c/d/I   Lab Results:  Recent Labs    11/18/23 2323 11/19/23 0642  WBC 9.3 15.1*  HGB 9.7* 10.1*  HCT 30.0* 30.5*  PLT 234 223   BMET Recent Labs    11/18/23 2323 11/19/23 0642  NA 137 131*  K 3.2* 4.2  CL 114* 103  CO2 16* 20*  GLUCOSE 82 94  BUN 15 20  CREATININE 1.10 1.53*  CALCIUM  5.9* 7.5*   PT/INR No results for input(s): LABPROT, INR in the last 72 hours. ABG No results for input(s): PHART, HCO3 in the last 72 hours.  Invalid input(s): PCO2, PO2  Studies/Results: DG Abd 1 View Result Date: 11/19/2023 CLINICAL DATA:  NG tube placement EXAM: ABDOMEN - 1 VIEW COMPARISON:  11/17/2023 FINDINGS: NG tube tip in the left abdomen, likely in the mid stomach. Continued dilated bowel loops. IMPRESSION: NG tube in the mid stomach. Electronically Signed   By: Franky Crease M.D.   On: 11/19/2023 01:21   DG CHEST PORT 1 VIEW Result Date: 11/18/2023 CLINICAL DATA:  Respiratory failure EXAM: PORTABLE CHEST 1 VIEW COMPARISON:  11/12/2023, 10/14/2023, CT 06/08/2023 FINDINGS: Hypoventilatory changes. Ground-glass opacities at the bases without significant change. Stable cardiomediastinal silhouette. No pneumothorax. Air-filled distended bowel beneath the diaphragms. IMPRESSION: Hypoventilatory changes with similar ground-glass  opacities/infiltrate at the bases. Similar bowel distension within the upper abdomen. Electronically Signed   By: Luke Bun M.D.   On: 11/18/2023 20:49   DG Abd Portable 1V-Small Bowel Obstruction Protocol-initial, 8 hr delay Result Date: 11/17/2023 CLINICAL DATA:  8 hour delay small-bowel obstruction. EXAM: PORTABLE ABDOMEN - 1 VIEW COMPARISON:  CT 11/16/2023 FINDINGS: The majority of the contrast appears to still be within the stomach. There are dilated small bowel loops in the abdomen similar to prior CT compatible with small bowel obstruction. There appears to be contrast in the right colon. No free air. IMPRESSION: Continued dilated small bowel suggesting small bowel obstruction. There appears to be some contrast within the right colon compatible with partial obstruction. Electronically Signed   By: Franky Crease M.D.   On: 11/17/2023 23:14    Anti-infectives: Anti-infectives (From admission, onward)    Start     Dose/Rate Route Frequency Ordered Stop   11/18/23 1200  erythromycin  (E-MYCIN ) tablet 250 mg        250 mg Oral 2 times daily with meals 11/18/23 0720     11/13/23 1000  acyclovir  (ZOVIRAX ) tablet 400 mg        400 mg Oral 2 times daily 11/13/23 0503         Assessment/Plan: Ileus, impaired gastrointestinal motility - likely secondary to recent multiple myeloma treatment No radiologic or clinical signs of obstruction  Recent diagnostic laparoscopy negative for mechanical obstruction Unable to use Reglan  due to history of tardive dyskinesia Consider GI consult PO Erythromycin  trial  did not seem to help No further surgical options are available.  Recommend GI eval for assistance with bowel motility Keep Mag >2 and K > 3.5   LOS: 6 days    David Mcgrath 11/19/2023

## 2023-11-20 ENCOUNTER — Other Ambulatory Visit: Payer: Self-pay

## 2023-11-20 ENCOUNTER — Telehealth: Payer: Self-pay | Admitting: Cardiology

## 2023-11-20 DIAGNOSIS — K567 Ileus, unspecified: Secondary | ICD-10-CM | POA: Diagnosis not present

## 2023-11-20 DIAGNOSIS — C9000 Multiple myeloma not having achieved remission: Secondary | ICD-10-CM | POA: Diagnosis not present

## 2023-11-20 DIAGNOSIS — Z9889 Other specified postprocedural states: Secondary | ICD-10-CM

## 2023-11-20 DIAGNOSIS — I480 Paroxysmal atrial fibrillation: Secondary | ICD-10-CM

## 2023-11-20 DIAGNOSIS — R110 Nausea: Secondary | ICD-10-CM | POA: Diagnosis not present

## 2023-11-20 DIAGNOSIS — K56609 Unspecified intestinal obstruction, unspecified as to partial versus complete obstruction: Secondary | ICD-10-CM | POA: Diagnosis not present

## 2023-11-20 DIAGNOSIS — E43 Unspecified severe protein-calorie malnutrition: Secondary | ICD-10-CM | POA: Insufficient documentation

## 2023-11-20 DIAGNOSIS — K5669 Other partial intestinal obstruction: Secondary | ICD-10-CM | POA: Diagnosis not present

## 2023-11-20 DIAGNOSIS — Z7901 Long term (current) use of anticoagulants: Secondary | ICD-10-CM | POA: Diagnosis not present

## 2023-11-20 DIAGNOSIS — Z79899 Other long term (current) drug therapy: Secondary | ICD-10-CM

## 2023-11-20 DIAGNOSIS — R230 Cyanosis: Secondary | ICD-10-CM | POA: Diagnosis not present

## 2023-11-20 LAB — CBC WITH DIFFERENTIAL/PLATELET
Abs Immature Granulocytes: 0.06 K/uL (ref 0.00–0.07)
Basophils Absolute: 0.1 K/uL (ref 0.0–0.1)
Basophils Relative: 0 %
Eosinophils Absolute: 0.2 K/uL (ref 0.0–0.5)
Eosinophils Relative: 1 %
HCT: 33.3 % — ABNORMAL LOW (ref 39.0–52.0)
Hemoglobin: 11.1 g/dL — ABNORMAL LOW (ref 13.0–17.0)
Immature Granulocytes: 0 %
Lymphocytes Relative: 4 %
Lymphs Abs: 0.5 K/uL — ABNORMAL LOW (ref 0.7–4.0)
MCH: 31.8 pg (ref 26.0–34.0)
MCHC: 33.3 g/dL (ref 30.0–36.0)
MCV: 95.4 fL (ref 80.0–100.0)
Monocytes Absolute: 0.6 K/uL (ref 0.1–1.0)
Monocytes Relative: 4 %
Neutro Abs: 12.8 K/uL — ABNORMAL HIGH (ref 1.7–7.7)
Neutrophils Relative %: 91 %
Platelets: 261 K/uL (ref 150–400)
RBC: 3.49 MIL/uL — ABNORMAL LOW (ref 4.22–5.81)
RDW: 17.5 % — ABNORMAL HIGH (ref 11.5–15.5)
WBC: 14.2 K/uL — ABNORMAL HIGH (ref 4.0–10.5)
nRBC: 0 % (ref 0.0–0.2)

## 2023-11-20 LAB — BASIC METABOLIC PANEL WITH GFR
Anion gap: 7 (ref 5–15)
BUN: 23 mg/dL (ref 8–23)
CO2: 23 mmol/L (ref 22–32)
Calcium: 7.6 mg/dL — ABNORMAL LOW (ref 8.9–10.3)
Chloride: 105 mmol/L (ref 98–111)
Creatinine, Ser: 1.34 mg/dL — ABNORMAL HIGH (ref 0.61–1.24)
GFR, Estimated: 56 mL/min — ABNORMAL LOW (ref >60)
Glucose, Bld: 115 mg/dL — ABNORMAL HIGH (ref 70–99)
Potassium: 3.9 mmol/L (ref 3.5–5.1)
Sodium: 135 mmol/L (ref 135–145)

## 2023-11-20 LAB — MAGNESIUM: Magnesium: 2.1 mg/dL (ref 1.7–2.4)

## 2023-11-20 LAB — GLUCOSE, CAPILLARY
Glucose-Capillary: 102 mg/dL — ABNORMAL HIGH (ref 70–99)
Glucose-Capillary: 123 mg/dL — ABNORMAL HIGH (ref 70–99)
Glucose-Capillary: 91 mg/dL (ref 70–99)
Glucose-Capillary: 94 mg/dL (ref 70–99)

## 2023-11-20 LAB — HEPARIN LEVEL (UNFRACTIONATED): Heparin Unfractionated: >1.1 [IU]/mL — ABNORMAL HIGH (ref 0.30–0.70)

## 2023-11-20 MED ORDER — DEXTROSE IN LACTATED RINGERS 5 % IV SOLN: INTRAVENOUS | Status: AC

## 2023-11-20 MED ORDER — SODIUM CHLORIDE 0.9% FLUSH: 10.0000 mL | INTRAVENOUS | Status: DC | PRN

## 2023-11-20 MED ORDER — APIXABAN 5 MG PO TABS: 5.0000 mg | ORAL_TABLET | Freq: Two times a day (BID) | ORAL | 1 refills | Status: DC

## 2023-11-20 MED ORDER — SODIUM CHLORIDE 0.9% FLUSH
10.0000 mL | Freq: Two times a day (BID) | INTRAVENOUS | Status: DC
  Administered 2023-11-20: 40 mL
  Administered 2023-11-20: 20 mL
  Administered 2023-11-21: 10 mL
  Administered 2023-11-22: 20 mL
  Administered 2023-11-22: 10 mL
  Administered 2023-11-23: 30 mL
  Administered 2023-11-23: 20 mL
  Administered 2023-11-24: 10 mL
  Administered 2023-11-24: 30 mL
  Administered 2023-11-25 – 2023-11-26 (×3): 10 mL
  Administered 2023-11-26: 20 mL
  Administered 2023-11-27 – 2023-11-29 (×6): 10 mL
  Administered 2023-11-30: 25 mL
  Administered 2023-11-30: 10 mL
  Administered 2023-12-01: 20 mL
  Administered 2023-12-01: 40 mL
  Administered 2023-12-02: 10 mL
  Administered 2023-12-02 – 2023-12-03 (×2): 20 mL
  Administered 2023-12-03: 10 mL
  Administered 2023-12-04: 30 mL
  Administered 2023-12-04: 20 mL
  Administered 2023-12-05 – 2023-12-20 (×28): 10 mL

## 2023-11-20 MED ORDER — THIAMINE HCL 100 MG/ML IJ SOLN
100.0000 mg | Freq: Every day | INTRAMUSCULAR | Status: AC
  Administered 2023-11-20 – 2023-11-24 (×5): 100 mg via INTRAVENOUS
  Filled 2023-11-20 (×5): qty 2

## 2023-11-20 MED ORDER — HEPARIN (PORCINE) 25000 UT/250ML-% IV SOLN
1900.0000 [IU]/h | INTRAVENOUS | Status: AC
  Administered 2023-11-20: 1150 [IU]/h via INTRAVENOUS
  Administered 2023-11-21 – 2023-11-22 (×2): 1050 [IU]/h via INTRAVENOUS
  Administered 2023-11-23: 1350 [IU]/h via INTRAVENOUS
  Administered 2023-11-24: 2050 [IU]/h via INTRAVENOUS
  Administered 2023-11-24: 1900 [IU]/h via INTRAVENOUS
  Administered 2023-11-25: 2050 [IU]/h via INTRAVENOUS
  Administered 2023-11-25 – 2023-11-28 (×7): 2150 [IU]/h via INTRAVENOUS
  Administered 2023-11-29 – 2023-11-30 (×2): 2000 [IU]/h via INTRAVENOUS
  Filled 2023-11-20 (×15): qty 250

## 2023-11-20 MED ORDER — INSULIN ASPART 100 UNIT/ML IJ SOLN
0.0000 [IU] | Freq: Four times a day (QID) | INTRAMUSCULAR | Status: DC
  Administered 2023-11-22: 1 [IU] via SUBCUTANEOUS

## 2023-11-20 MED ORDER — TRAVASOL 10 % IV SOLN
INTRAVENOUS | Status: AC
  Filled 2023-11-20: qty 480

## 2023-11-20 MED ORDER — LABETALOL HCL 5 MG/ML IV SOLN
10.0000 mg | INTRAVENOUS | Status: DC | PRN
  Administered 2023-11-20 – 2023-12-06 (×3): 10 mg via INTRAVENOUS
  Filled 2023-11-20 (×4): qty 4

## 2023-11-20 MED ORDER — HYDRALAZINE HCL 20 MG/ML IJ SOLN
10.0000 mg | INTRAMUSCULAR | Status: DC | PRN
  Administered 2023-11-21 – 2023-12-06 (×3): 10 mg via INTRAVENOUS
  Filled 2023-11-20 (×3): qty 1

## 2023-11-20 NOTE — Plan of Care (Signed)
 NG tube still in place with low intermittent suction, approximately 350 ml green drainage out and approximately 200 ml urine output this shift thus far. Patient denies nausea or pain. Patient's only complaint is dry mouth that we are palliating with oral care. All four extremities remain edematous. No hypotension or significantly abnormal vital signs seen.   Problem: Education: Goal: Knowledge of General Education information will improve Description: Including pain rating scale, medication(s)/side effects and non-pharmacologic comfort measures Outcome: Progressing   Problem: Health Behavior/Discharge Planning: Goal: Ability to manage health-related needs will improve Outcome: Progressing   Problem: Clinical Measurements: Goal: Ability to maintain clinical measurements within normal limits will improve Outcome: Progressing Goal: Will remain free from infection Outcome: Progressing Goal: Diagnostic test results will improve Outcome: Progressing Goal: Respiratory complications will improve Outcome: Progressing Goal: Cardiovascular complication will be avoided Outcome: Progressing   Problem: Activity: Goal: Risk for activity intolerance will decrease Outcome: Progressing   Problem: Nutrition: Goal: Adequate nutrition will be maintained Outcome: Progressing   Problem: Coping: Goal: Level of anxiety will decrease Outcome: Progressing   Problem: Elimination: Goal: Will not experience complications related to bowel motility Outcome: Progressing Goal: Will not experience complications related to urinary retention Outcome: Progressing   Problem: Pain Managment: Goal: General experience of comfort will improve and/or be controlled Outcome: Progressing   Problem: Safety: Goal: Ability to remain free from injury will improve Outcome: Progressing   Problem: Skin Integrity: Goal: Risk for impaired skin integrity will decrease Outcome: Progressing   Problem: Nutrition Goal:  Patient maintains adequate hydration Outcome: Progressing Goal: Patient maintains weight Outcome: Progressing Goal: Patient will verbalize dietary restrictions Outcome: Progressing

## 2023-11-20 NOTE — Progress Notes (Addendum)
 Progress Note    David Mcgrath   FMW:[id number]  DOB: 1950/07/26  DOA: 11/12/2023     7 PCP: David Skates, DO  Initial CC: Abdominal pain  Hospital Course: 73 y.o. male with medical history significant for hypertension, hyperlipidemia, paroxysmal A-fib on Eliquis , chronic anxiety/depression, polyneuropathy, multiple myeloma (diagnosed March 2025) received Bortezomib  infusion 11/09/2023. Patient was admitted here 11/02/2023 through 11/05/2023 with intractable nausea vomiting, recurrent small bowel obstruction.  He was treated conservatively with NG tube decompression and discharged home.  Last admission CT abdomen/pelvis with contrast with slight improvement in small bowel dilation when compared to prior exam but with mild transition point deep in the pelvis, no pancreatic ductal dilation, no inflammatory changes, stable free fluid within the abdomen.  General surgery was consulted and patient underwent diagnostic laparoscopy by Dr. [name] on 11/03/2023 findings of intermittently dilated small bowel with erythema of the serosa consistent with inflammation/edema, intermittent injection of the small bowel mesentery, small bowel with tendency to migrate to the left upper quadrant with long/mobile small bowel mesentery with suspicion/potential for intermittent volvulized small bowel contributing to his symptoms and findings on imaging.  Diet was slowly advanced with toleration.    This admission CT abdomen shows increasing small bowel dilatation and fecalization of bowel contents involving the jejunum. No definitive transition point is identified at this time but caliber change is noted in the right mid abdomen at the junction of the jejunum and ileum. The more distal ileum is unremarkable. Stable ground-glass opacities in the lung bases similar to that seen on prior exams.   Assessment/Plan:   Ileus versus partial small bowel obstruction Patient presented with nausea vomiting and diarrhea.  CT  scan raise concern for ileus versus partial SBO. Patient was given symptomatic treatment.  NGT was not placed initially Patient initially started improving but then had recurrence of his nausea and vomiting.  CT scan was repeated which showed persistent small bowel dilatation with prominent jejunum as seen before. - Surgery also following, appreciate assistance - Minimal ambulation due to his weakness also -Trial of erythromycin  started on 11/18/2023 but not successful; began having large biliary emesis on 8/16 - NGT placed 8/16 - continue NPO, IVF for now - GI consulted but no major rec's aside from current treatment plan in place - surgery to discuss if need for further repeat laparoscopy  - given prolonged hospitalization with minimal to no nutrition, now warrants TPN; PICC placed 8/18 and TPN ordered  Hypotension Lactic acidosis Leukocytosis - large GI losses on 8/16 with emesis episode, then became hypotensive and lactic elevated - s/p fluid boluses overnight and started on maintenance fluid -Lactic now normalized 8/17; low suspicion for infection; this appears hypovolemia for now - s/p IVF - continue trending CBC; holding off on abx at this time   Diarrhea Reports semiformed stools.   - GI pathogen panel negative -Colonized with C. difficile but testing negative for active infection (antigen positive, toxin negative, PCR negative)  Acute urinary retention Foley catheter was placed on 8/11.  Voiding trial once he is more stable and/or stronger   Hypotension Resolved with IV fluids   History of multiple myeloma Followed by medical oncology, Dr. Federico.  Some of his symptoms are thought to be due to his chemotherapeutic medications.  PAF Eliquis  is currently on hold in case he needs surgical intervention.   Currently not on any rate limiting medications.  Home medication list reviewed.  He is on metoprolol  but only as needed. Continue to  monitor on telemetry.   Normocytic  anemia Stable hemoglobin noted.   Hyponatremia/hypokalemia/hypomagnesemia Potassium was supplemented.  Sodium level remains low.  There could be an element of SIADH.  Continue to monitor.  Supplement magnesium .   Hypoglycemia Probably from poor oral intake.  Started on D5 infusion with improvement in glucose levels.   Chronic anxiety and depression Continue psychotropic medications.   Physical deconditioning PT and OT.  Interval History:  No events overnight.  NG tube remains in place and he is feeling a little bit better in general.  Still has high output from NG tube, 1.9 L over past 24 hours. Discussed tentative plan for PICC line placement and starting TPN with patient and his wife, they are amenable. Further surgical plans still to be determined.  Addendum: Informed this afternoon that left hand fingertips are cyanotic and cool to touch. Has intact radial pulse. PICC just placed earlier but wife also noted that fingertips appeared similar yesterday evening. Case discussed with vascular and obtaining arterial ultrasound.   Old records reviewed in assessment of this patient  Antimicrobials:   DVT prophylaxis:  enoxaparin  (LOVENOX ) injection 40 mg Start: 11/17/23 1400 Place and maintain sequential compression device Start: 11/16/23 1142   Code Status:   Code Status: Full Code  Mobility Assessment (Last 72 Hours)     Mobility Assessment     Row Name 11/20/23 0956 11/20/23 0800 11/19/23 2032 11/19/23 0800 11/18/23 1255   Does the patient have exclusion criteria? -- No - Perform mobility assessment No - Perform mobility assessment No - Perform mobility assessment --   What is the highest level of mobility based on the mobility assessment? Level 1 (Bedfast) - Unable to balance while sitting on edge of bed Level 2 (Chairfast) - Balance while sitting on edge of bed and cannot stand Level 2 (Chairfast) - Balance while sitting on edge of bed and cannot stand Level 2 (Chairfast) -  Balance while sitting on edge of bed and cannot stand Level 3 (Stands with assistance) - Balance while standing  and cannot march in place   Is the above level different from baseline mobility prior to current illness? -- Yes - Recommend PT order Yes - Recommend PT order -- --    Row Name 11/18/23 0815 11/17/23 2221         Does the patient have exclusion criteria? No - Perform mobility assessment No - Perform mobility assessment      What is the highest level of mobility based on the mobility assessment? Level 3 (Stands with assistance) - Balance while standing  and cannot march in place Level 3 (Stands with assistance) - Balance while standing  and cannot march in place      Is the above level different from baseline mobility prior to current illness? Yes - Recommend PT order Yes - Recommend PT order         Barriers to discharge: none Disposition Plan:  Home HH orders placed: n/a Status is: Inpt  Objective: Blood pressure (!) 154/51, pulse 96, temperature 97.9 F (36.6 C), temperature source Oral, resp. rate (!) 25, height 6' 4 (1.93 m), weight 94.6 kg, SpO2 100%.  Examination:  Physical Exam Constitutional:      General: He is not in acute distress.    Appearance: Normal appearance.  HENT:     Head: Normocephalic and atraumatic.     Comments: NG tube now in place    Mouth/Throat:     Mouth: Mucous membranes are moist.  Eyes:     Extraocular Movements: Extraocular movements intact.  Cardiovascular:     Rate and Rhythm: Normal rate and regular rhythm.  Pulmonary:     Effort: Pulmonary effort is normal. No respiratory distress.     Breath sounds: Normal breath sounds. No wheezing.  Abdominal:     General: Bowel sounds are absent. There is no distension.     Palpations: Abdomen is soft.     Tenderness: There is no abdominal tenderness.  Musculoskeletal:        General: Normal range of motion.     Cervical back: Normal range of motion and neck supple.     Comments: Cool  to touch left fingertips with cyanosis; intact left radial pulse. Left forearm noted with edema and erythema over forearm from recent extravasation. No pain in left hand  Skin:    General: Skin is warm and dry.  Neurological:     General: No focal deficit present.     Mental Status: He is alert.  Psychiatric:        Mood and Affect: Mood normal.        Behavior: Behavior normal.      Consultants:  General surgery GI  Procedures:  11/20/2023: PICC line placement  Data Reviewed: Results for orders placed or performed during the hospital encounter of 11/12/23 (from the past 24 hours)  Basic metabolic panel with GFR     Status: Abnormal   Collection Time: 11/20/23  2:44 AM  Result Value Ref Range   Sodium 135 135 - 145 mmol/L   Potassium 3.9 3.5 - 5.1 mmol/L   Chloride 105 98 - 111 mmol/L   CO2 23 22 - 32 mmol/L   Glucose, Bld 115 (H) 70 - 99 mg/dL   BUN 23 8 - 23 mg/dL   Creatinine, Ser 8.65 (H) 0.61 - 1.24 mg/dL   Calcium  7.6 (L) 8.9 - 10.3 mg/dL   GFR, Estimated 56 (L) >60 mL/min   Anion gap 7 5 - 15  CBC with Differential/Platelet     Status: Abnormal   Collection Time: 11/20/23  2:44 AM  Result Value Ref Range   WBC 14.2 (H) 4.0 - 10.5 K/uL   RBC 3.49 (L) 4.22 - 5.81 MIL/uL   Hemoglobin 11.1 (L) 13.0 - 17.0 g/dL   HCT 66.6 (L) 60.9 - 47.9 %   MCV 95.4 80.0 - 100.0 fL   MCH 31.8 26.0 - 34.0 pg   MCHC 33.3 30.0 - 36.0 g/dL   RDW 82.4 (H) 88.4 - 84.4 %   Platelets 261 150 - 400 K/uL   nRBC 0.0 0.0 - 0.2 %   Neutrophils Relative % 91 %   Neutro Abs 12.8 (H) 1.7 - 7.7 K/uL   Lymphocytes Relative 4 %   Lymphs Abs 0.5 (L) 0.7 - 4.0 K/uL   Monocytes Relative 4 %   Monocytes Absolute 0.6 0.1 - 1.0 K/uL   Eosinophils Relative 1 %   Eosinophils Absolute 0.2 0.0 - 0.5 K/uL   Basophils Relative 0 %   Basophils Absolute 0.1 0.0 - 0.1 K/uL   Immature Granulocytes 0 %   Abs Immature Granulocytes 0.06 0.00 - 0.07 K/uL  Magnesium      Status: None   Collection Time: 11/20/23   2:44 AM  Result Value Ref Range   Magnesium  2.1 1.7 - 2.4 mg/dL  Glucose, capillary     Status: Abnormal   Collection Time: 11/20/23  7:36 AM  Result Value Ref Range  Glucose-Capillary 102 (H) 70 - 99 mg/dL  Glucose, capillary     Status: None   Collection Time: 11/20/23 12:17 PM  Result Value Ref Range   Glucose-Capillary 94 70 - 99 mg/dL   Comment 1 Notify RN     I have reviewed pertinent nursing notes, vitals, labs, and images as necessary. I have ordered labwork to follow up on as indicated.  I have reviewed the last notes from staff over past 24 hours. I have discussed patient's care plan and test results with nursing staff, CM/SW, and other staff as appropriate.  Time spent: Greater than 50% of the 55 minute visit was spent in counseling/coordination of care for the patient as laid out in the A&P.   LOS: 7 days   Alm Apo, MD [facility] 11/20/2023, 1:54 PM

## 2023-11-20 NOTE — Progress Notes (Signed)
 PHARMACY - TOTAL PARENTERAL NUTRITION CONSULT NOTE   Indication: Bowel Obstruction  Patient Measurements: Height: 6' 4 (193 cm) Weight: 94.6 kg (208 lb 8.9 oz) IBW/kg (Calculated) : 86.8 TPN AdjBW (KG): 94.6 Body mass index is 25.39 kg/m. Usual Weight:   Assessment:  Pharmacy is consulted to start TPN on 73 yo male diagnosed with bowel obstruction. This admission CT abdomen shows increasing small bowel dilatation and fecalization of bowel contents involving the jejunum. No definitive transition point is identified at this time but caliber change is noted in the right mid abdomen at the junction of the jejunum and ileum. The more distal ileum is unremarkable. Some suspicion that bowel obstruction may be related to bortezomib  treatment pt receiving for multiple myeloma.   Glucose / Insulin :  - no hx of DM - CBG have been < 120 for most part with some low readings under 70 Electrolytes:  - Electrolytes are WNL including CorrCa at 9.1 Renal:  - Scr increased in past three days ( 1.10 >> 1.53> 1.34) but slight trend down today  Hepatic:  - Total I/0 -1078 mL, 3150 mL though NG tube  - LFTs, alk phos, bilirubin WNL  Intake / Output; MIVF:  - D5LR at 75 ml/hr  GI Imaging: -  Admitted from 10/14/23-10/20/23 for symptoms related to small bowel obstruction (transition point suspected in the right hemi-abdomen on CT imaging) and received non-operative management.  - Admitted 11/02/23 - 11/05/2023 for small bowel dilatation/concern for small bowel obstruction. CT imaging showed persistent small bowel dilatation with mild transition point noted deep in pelvis. He went to OR 11/03/23, findings were suspicious for intermittent volvulus of small bowel to explain his symptoms and CT imaging.  - 8/10  CT abdomen shows increasing small bowel dilatation and fecalization of bowel contents involving the jejunum. No definitive transition point is identified at this time but caliber change is noted in the right mid  abdomen at the junction of the jejunum and ileum.  GI Surgeries / Procedures:  8/1: to OR 11/03/23, findings were suspicious for intermittent volvulus of small bowel to explain his symptoms and CT imaging. Central access: ordered for 8/18  Nutritional Goals: Goal TPN rate is --- mL/hr (provides --- g of protein and --- kcals per day)  RD Assessment:  - pending     Current Nutrition:  NPO  Plan:  Start TPN at 40 mL/hr at 1800 Electrolytes in TPN:  Na 50mEq/L K 50mEq/L  Ca 5mEq/L Mg 5mEq/L Phos 15mmol/L.  Cl:Ac 1:1 Add standard MVI and trace elements to TPN Initiate Sensitive q6h SSI and adjust as needed  Reduce MIVF to 35 mL/hr at 1800 BMP, magnesium , phosphorus with AM labs  Monitor TPN labs on Mon/Thurs   Dolphus Roller, PharmD, BCPS 11/20/2023 9:18 AM

## 2023-11-20 NOTE — Telephone Encounter (Signed)
 Prescription refill request for Eliquis  received. Indication:afib Last office visit:5/25 Scr:1.34  8/25 Age: 73 Weight:94.6  kg  Prescription refilled

## 2023-11-20 NOTE — Progress Notes (Signed)
 Subjective/Chief Complaint: Some diarrhea and flatus on Friday night/Saturday am, but nothing since then.  Still with high NGT output.  No abdominal pain   Objective: Vital signs in last 24 hours: Temp:  [97 F (36.1 C)-98.2 F (36.8 C)] 97.9 F (36.6 C) (08/18 0730) Pulse Rate:  [52-93] 93 (08/18 0600) Resp:  [15-27] 15 (08/18 0600) BP: (113-165)/(32-64) 133/45 (08/18 0600) SpO2:  [100 %] 100 % (08/18 0600) Last BM Date : 11/18/23  Intake/Output from previous day: 08/17 0701 - 08/18 0700 In: 2664.9 [I.V.:1840.3; NG/GT:30; IV Piggyback:794.6] Out: 2100 [Urine:200; Emesis/NG output:1900] Intake/Output this shift: No intake/output data recorded.  Ab soft, ND, nontender, incisions clean, NGT cannister just changed, but lighter green, thick, bilious output in tubing, 1900cc yesterday  Lab Results:  Recent Labs    11/19/23 0642 11/20/23 0244  WBC 15.1* 14.2*  HGB 10.1* 11.1*  HCT 30.5* 33.3*  PLT 223 261   BMET Recent Labs    11/19/23 0642 11/20/23 0244  NA 131* 135  K 4.2 3.9  CL 103 105  CO2 20* 23  GLUCOSE 94 115*  BUN 20 23  CREATININE 1.53* 1.34*  CALCIUM  7.5* 7.6*   PT/INR No results for input(s): LABPROT, INR in the last 72 hours. ABG No results for input(s): PHART, HCO3 in the last 72 hours.  Invalid input(s): PCO2, PO2  Studies/Results: US  EKG SITE RITE Result Date: 11/20/2023 If Site Rite image not attached, placement could not be confirmed due to current cardiac rhythm.  DG Abd 1 View Result Date: 11/19/2023 CLINICAL DATA:  NG tube placement EXAM: ABDOMEN - 1 VIEW COMPARISON:  11/17/2023 FINDINGS: NG tube tip in the left abdomen, likely in the mid stomach. Continued dilated bowel loops. IMPRESSION: NG tube in the mid stomach. Electronically Signed   By: Franky Crease M.D.   On: 11/19/2023 01:21   DG CHEST PORT 1 VIEW Result Date: 11/18/2023 CLINICAL DATA:  Respiratory failure EXAM: PORTABLE CHEST 1 VIEW COMPARISON:  11/12/2023,  10/14/2023, CT 06/08/2023 FINDINGS: Hypoventilatory changes. Ground-glass opacities at the bases without significant change. Stable cardiomediastinal silhouette. No pneumothorax. Air-filled distended bowel beneath the diaphragms. IMPRESSION: Hypoventilatory changes with similar ground-glass opacities/infiltrate at the bases. Similar bowel distension within the upper abdomen. Electronically Signed   By: Luke Bun M.D.   On: 11/18/2023 20:49    Anti-infectives: Anti-infectives (From admission, onward)    Start     Dose/Rate Route Frequency Ordered Stop   11/18/23 1200  erythromycin  (E-MYCIN ) tablet 250 mg  Status:  Discontinued        250 mg Oral 2 times daily with meals 11/18/23 0720 11/19/23 0734   11/13/23 1000  acyclovir  (ZOVIRAX ) tablet 400 mg  Status:  Discontinued        400 mg Oral 2 times daily 11/13/23 0503 11/19/23 0739       Assessment/Plan: POD 17 dx lsc for possible sbo-negative, ? ileus -still with symptoms, operating surgeon mentioned the possibility that he has a floppy mesentery and he could be intermittently volvulizing, but unclear -GI weighed in yesterday that cancer med could cause ileus type response.  This has been DC. -they did not recommend any specific medication treatment for his possible dysmotility at this time -unclear if surgical role is needed.  Will discuss with MD -WBC 14K, monitor, cr up from normal to 1.5 yesterday and 1.34 today.  Likely secondary to fluid shifts.  Defer to primary -agree with primary, discussed on ward, on starting TNA for nutrition given he  has already been here 7 days with no diet and this has been going on for the last  month, etc.  FEN - NPO/NGT/IVFs/TNA VTE - Lovenox  ID - none  MM PCM - TNA AKI HTN HLD A fib  11/20/2023

## 2023-11-20 NOTE — Consult Note (Signed)
 Hospital Consult    Reason for Consult: Cyanotic fingertips involving the left 2nd through 5th digits. Requesting Physician: Hospital medicine MRN #:  [id number]  History of Present Illness: This is a 73 y.o. male with medical history outlined below who is currently hospitalized with partial small bowel obstruction, volvulus.  Now status post diagnostic laparoscopy, currently with NG tube.  Other in-hospital medical problems include hypertension, diarrhea, acute urinary retention, multiple myeloma-treated with outpatient chemotherapeutic regimen, anemia amongst others.  Vascular surgery was called due to a 2-day history of cyanotic fingertips on the left 2nd through 5th digits.  This was initially noted by his wife.  Natalie denies pain, sensorimotor dysfunction.  He has neuropathy at baseline. No inciting events.  PICC line in the brachial vein in the left arm. Patient recently had infiltration event from left arm IV, now pulled.   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  Patient reports he does not have symptoms as of 02/25/14.     Diarrhea 06/04/2023   Essential hypertension 06/04/2023   Fatty liver 06/04/2023   Flu 06/04/2023   Hyperlipidemia 06/04/2023   Ileus (HCC) 06/04/2023   Multiple myeloma (HCC)    Neuropathy    Paroxysmal atrial fibrillation (HCC) 07/04/2023   Pneumonia 06/04/2023    Past Surgical History:  Procedure Laterality Date   IR BONE MARROW BIOPSY & ASPIRATION  07/31/2023   LAPAROSCOPY N/A 11/03/2023   Procedure: LAPAROSCOPY, DIAGNOSTIC;  Surgeon: [name] Mitzie LABOR, MD;  Location: [facility];  Service: General;  Laterality: N/A;  POSSIBLE EX LAP   NO PAST SURGERIES      Allergies  Allergen Reactions   Shrimp [Shellfish Allergy] Anaphylaxis and  Swelling    Swelling throat    Reglan  Dobby.Dimmer ] Other (See Comments)    caused tardive dyskinesia    Prior to Admission medications   Medication Sig Start Date End Date Taking? Authorizing Provider  allopurinol  (ZYLOPRIM ) 300 MG tablet Take 300 mg by mouth daily.   Yes [provider]  calcium -vitamin D  (OSCAL WITH D) 500-5 MG-MCG tablet Take 1 tablet by mouth 2 (two) times daily. 09/21/23  Yes Federico Norleen ONEIDA MADISON, MD  Cholecalciferol  (VITAMIN D -3) 125 MCG (5000 UT) TABS Take 5,000 Units by mouth daily.   Yes [provider]  Cyanocobalamin  (VITAMIN B12) 1000 MCG TBCR Take 1,000 mcg by mouth daily.   Yes [provider]  dexamethasone  (DECADRON ) 4 MG tablet Take 40 mg by mouth once a week on day of Velcade  injection. (10 tablets) 09/07/23  Yes Dorsey, Yolanda T IV, MD  diphenoxylate -atropine  (LOMOTIL ) 2.5-0.025 MG tablet Take 1 tablet by mouth 4 (four) times daily as needed for diarrhea or loose stools. 08/23/23  Yes Thayil, Irene T, PA-C  ezetimibe  (ZETIA ) 10 MG tablet Take 10 mg by mouth every evening. 06/01/19  Yes [provider]  FLUoxetine  (PROZAC ) 20 MG capsule Take 20 mg by mouth daily.   Yes [provider]  fluticasone  (FLONASE ) 50 MCG/ACT nasal spray Place 1 spray into both nostrils daily as needed for allergies or rhinitis.   Yes [provider]  gabapentin  (NEURONTIN ) 300 MG capsule Take 2 capsules (600 mg total) by mouth 2 (two) times daily. 06/15/23  Yes Briana Elgin LABOR, MD  guaiFENesin  (MUCINEX ) 600 MG 12  hr tablet Take 600 mg by mouth 2 (two) times daily as needed for cough or to loosen phlegm.   Yes [provider]  melatonin 5 MG TABS Take 5 mg by mouth at bedtime.   Yes [provider]  metoprolol  tartrate (LOPRESSOR ) 25 MG tablet Take 0.5 tablets (12.5 mg total) by mouth daily as needed (For persistent palpitation). 10/20/23  Yes Dahal, Chapman, MD  omeprazole (PRILOSEC) 40 MG capsule Take 40 mg by mouth  daily.   Yes [provider]  potassium chloride  SA (KLOR-CON  M) 20 MEQ tablet Take 1 tablet (20 mEq total) by mouth 2 (two) times daily. Patient taking differently: Take 20 mEq by mouth daily. 09/28/23  Yes Federico Norleen ONEIDA MADISON, MD  pravastatin  (PRAVACHOL ) 80 MG tablet Take 80 mg by mouth at bedtime.   Yes [provider]  pyridoxine  (B-6) 100 MG tablet Take 100 mg by mouth daily.   Yes [provider]  apixaban  (ELIQUIS ) 5 MG TABS tablet Take 1 tablet (5 mg total) by mouth 2 (two) times daily. 11/20/23   Revankar, Jennifer SAUNDERS, MD    Social History   Socioeconomic History   Marital status: Married    Spouse name: Almarie   Number of children: 0   Years of education: Not on file   Highest education level: Not on file  Occupational History   Occupation: Event organiser  Tobacco Use   Smoking status: Never   Smokeless tobacco: Never  Substance and Sexual Activity   Alcohol  use: Yes    Comment: socialy   Drug use: No   Sexual activity: Not on file  Other Topics Concern   Not on file  Social History Narrative   Lives with spouse   Right handed   Drinks 4+ cups of caffeine daily   Retired    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Corporate investment banker Strain: Not on file  Food Insecurity: No Food Insecurity (11/13/2023)   Hunger Vital Sign    Worried About Running Out of Food in the Last Year: Never true    Ran Out of Food in the Last Year: Never true  Transportation Needs: No Transportation Needs (11/13/2023)   PRAPARE - Administrator, Civil Service (Medical): No    Lack of Transportation (Non-Medical): No  Physical Activity: Not on file  Stress: Not on file  Social Connections: Socially Integrated (11/13/2023)   Social Connection and Isolation Panel    Frequency of Communication with Friends and Family: More than three times a week    Frequency of Social Gatherings with Friends and Family: Twice a week    Attends Religious Services: 1 to 4 times per year     Active Member of [employer] or Organizations: Yes    Attends Engineer, structural: More than 4 times per year    Marital Status: Married  Catering manager Violence: Not At Risk (11/13/2023)   Humiliation, Afraid, Rape, and Kick questionnaire    Fear of Current or Ex-Partner: No    Emotionally Abused: No    Physically Abused: No    Sexually Abused: No   Family History  Problem Relation Age of Onset   Diabetes Mother    Cancer Mother    Heart disease Mother    Cancer Father    Hyperlipidemia Father    Neuropathy Paternal Uncle    Migraines Neg Hx     ROS: Otherwise negative unless mentioned in HPI  Physical Examination  Vitals:  11/20/23 1800 11/20/23 1920  BP: 139/72   Pulse: 95   Resp: (!) 23   Temp:  98.1 F (36.7 C)  SpO2: 100%    Body mass index is 25.39 kg/m.  General:  WDWN in NAD Gait: Not observed HENT: WNL, normocephalic Pulmonary: normal non-labored breathing, without Rales, rhonchi,  wheezing Cardiac: regular Abdomen:  nondistended Skin: without rashes Vascular Exam/Pulses: 2+ radial, 2+ ulnar, multiphasic palmar arch, digital signals in all digits. Extremities: with ischemic changes, without Gangrene , without cellulitis; without open wounds;  Musculoskeletal: no muscle wasting or atrophy  Neurologic: A&O X 3;  No focal weakness or paresthesias are detected; speech is fluent/normal Psychiatric:  The pt has Normal affect. Lymph:  Unremarkable  CBC    Component Value Date/Time   WBC 14.2 (H) 11/20/2023 0244   RBC 3.49 (L) 11/20/2023 0244   HGB 11.1 (L) 11/20/2023 0244   HGB 11.7 (L) 11/09/2023 1512   HGB 10.9 (L) 08/02/2023 0942   HCT 33.3 (L) 11/20/2023 0244   HCT 32.0 (L) 08/02/2023 0942   PLT 261 11/20/2023 0244   PLT 290 11/09/2023 1512   PLT 343 08/02/2023 0942   MCV 95.4 11/20/2023 0244   MCV 96 08/02/2023 0942   MCH 31.8 11/20/2023 0244   MCHC 33.3 11/20/2023 0244   RDW 17.5 (H) 11/20/2023 0244   RDW 15.1 08/02/2023 0942    LYMPHSABS 0.5 (L) 11/20/2023 0244   MONOABS 0.6 11/20/2023 0244   EOSABS 0.2 11/20/2023 0244   BASOSABS 0.1 11/20/2023 0244    BMET    Component Value Date/Time   NA 135 11/20/2023 0244   NA 141 08/02/2023 0942   K 3.9 11/20/2023 0244   CL 105 11/20/2023 0244   CO2 23 11/20/2023 0244   GLUCOSE 115 (H) 11/20/2023 0244   BUN 23 11/20/2023 0244   BUN 7 (L) 08/02/2023 0942   CREATININE 1.34 (H) 11/20/2023 0244   CREATININE 0.72 11/09/2023 1512   CALCIUM  7.6 (L) 11/20/2023 0244   GFRNONAA 56 (L) 11/20/2023 0244   GFRNONAA >60 11/09/2023 1512    COAGS: Lab Results  Component Value Date   INR 1.4 (H) 11/13/2023   INR 1.4 (H) 10/14/2023       ASSESSMENT/PLAN: This is a 73 y.o. male currently admitted with SBO status post diagnostic laparoscopy.  He has a 2-day history of cyanosis to the left hand which has waxed and waned over the course of the day.  On physical exam, normal pulses at the level of the wrist with normal signals in the hand and digits. I am unsure as to the etiology at this time.  Recommend continued conservative management. I would suspect that if this was athero or cardioembolic, the coloration would not wax and wane.  Furthermore, he is asymptomatic. I researched his medication for multiple myeloma.  This can also cause bluish discoloration of the fingertips. I would expect that if it was medication derived, it would be bilateral. Patient currently on heparin .  Will follow-up left arm arterial duplex study.  No plan for intervention at this time.  Please call should questions or concerns arise.  Fonda FORBES Rim MD MS [facility] [phone] 11/20/2023  8:10 PM

## 2023-11-20 NOTE — Progress Notes (Signed)
 Occupational Therapy Treatment Patient Details Name: David Mcgrath MRN: [id number] DOB: 09/07/1950 Today's Date: 11/20/2023   History of present illness David Mcgrath is a 73 y.o. male with  who presents to the ER with complaints of hypotension associated with cramping abdominal pain and poor oral intake for the past few days. PMH: hypertension, hyperlipidemia, paroxysmal A-fib on Eliquis , chronic anxiety/depression, polyneuropathy, multiple myeloma (diagnosed March 2025), recently admitted, from 7/31 through 11/05/2023, due to intractable nausea and vomiting from recurrent small bowel obstruction, had a diagnostic laparoscopy   OT comments  Unfortunately pt unable to show progress towards OT goals today with pt now too weak to assist in his own care including inability to raise Ues sufficiently to wash face or brush teeth, and as evidenced by an decreased score on 6-Clicks AM-PAC measure of occupational Performance with previous score of 12/24, and current score of 6/24 indicating need of total care. Pt quick to fatigue but motivated and agreeable to bed level HEP followed by sitting EOB to allow abdominal muscles to engage.  Pt continues to demonstrate fair rehab potential and would benefit from continued skilled OT to increase safety and independence with ADLs and functional transfers to allow pt to return home safely and reduce caregiver burden and fall risk.  Pt will need to make significant functional progress in order to meet his goal to return home and not go to SNF.  If pt returns home, would expect total care and need of Hoyer lift for home based on today.         If plan is discharge home, recommend the following:  Two people to help with walking and/or transfers;Two people to help with bathing/dressing/bathroom;Assistance with cooking/housework;Assistance with feeding;Direct supervision/assist for medications management;Direct supervision/assist for financial  management;Assist for transportation;Help with stairs or ramp for entrance;Supervision due to cognitive status;A lot of help with bathing/dressing/bathroom;A lot of help with walking and/or transfers   Equipment Recommendations  None recommended by OT    Recommendations for Other Services      Precautions / Restrictions Precautions Precautions: Fall Recall of Precautions/Restrictions: Intact Restrictions Weight Bearing Restrictions Per Provider Order: No       Mobility Bed Mobility Overal bed mobility: Needs Assistance Bed Mobility: Supine to Sit, Sit to Supine     Supine to sit: Mod assist, Used rails, HOB elevated Sit to supine: Max assist, Used rails, HOB elevated   General bed mobility comments: Assist to move UE to reach for rail and pt able to maintain grasp on his own once placed. Max As to advance LEs off EOB and Mod As to raise trunk. Sit to supine: Heavy Total Assist to rasise LEs onto bed, heavier from edema.    Transfers                         Balance Overall balance assessment: Needs assistance Sitting-balance support: Bilateral upper extremity supported, Feet supported Sitting balance-Leahy Scale: Fair Sitting balance - Comments: Pt tolerated sitting EOB ~5 min and pt initiated LE exercises with LAQs and ankle pumps. Pt tolerated 2 10 sec holds of trunk extension with cues for anterior shift of chest.                                   ADL either performed or assessed with clinical judgement   ADL Overall ADL's : (P) Needs assistance/impaired  General ADL Comments: Session focused on improving activity tolerance with bed level HEP and EOB sitting balance/tolerance.    Extremity/Trunk Assessment Upper Extremity Assessment Upper Extremity Assessment: RUE deficits/detail;LUE deficits/detail RUE Deficits / Details: Severe weakness with need of AAROM with more proximal ROM. LUE  Deficits / Details: Severe weakness with need of AAROM with more proximal ROM.  Hand very edematous and elevated.            Vision Baseline Vision/History: 0 No visual deficits;1 Wears glasses Patient Visual Report: No change from baseline     Perception     Praxis     Communication     Cognition                                              Cueing      Exercises Other Exercises Other Exercises: Supine: Pt performed 10 reps AROM BUEs: hand open/close, wrist ext/flex, forearm sup/pro and bnicep curls. Pt then tolerated 5 reps each of AAROM shoulder flexion and on 5th rep pt worked on holding arm without support and eccetric control with need of Min As.    Shoulder Instructions       General Comments      Pertinent Vitals/ Pain          Home Living                                          Prior Functioning/Environment              Frequency  Min 2X/week        Progress Toward Goals  OT Goals(current goals can now be found in the care plan section)     Acute Rehab OT Goals Patient Stated Goal: Get strong enough to go home OT Goal Formulation: With patient/family Time For Goal Achievement: 11/28/23 Potential to Achieve Goals: Fair  Plan      Co-evaluation                 AM-PAC OT 6 Clicks Daily Activity     Outcome Measure   Help from another person eating meals?: Total (No PO in 1.5 weeks per spouse. On NGT) Help from another person taking care of personal grooming?: Total Help from another person toileting, which includes using toliet, bedpan, or urinal?: Total Help from another person bathing (including washing, rinsing, drying)?: Total Help from another person to put on and taking off regular upper body clothing?: Total Help from another person to put on and taking off regular lower body clothing?: Total 6 Click Score: 6    End of Session Equipment Utilized During Treatment: Oxygen  OT Visit  Diagnosis: Unsteadiness on feet (R26.81);Other abnormalities of gait and mobility (R26.89);Muscle weakness (generalized) (M62.81);Cognitive communication deficit (R41.841)   Activity Tolerance Patient limited by fatigue   Patient Left in bed;with call bell/phone within reach;with bed alarm set   Nurse Communication Mobility status;Other (comment)        Time: 9149-9079 OT Time Calculation (min): 30 min  Charges: OT General Charges $OT Visit: 1 Visit OT Treatments $Therapeutic Activity: 8-22 mins $Therapeutic Exercise: 8-22 mins  Delon, OT Acute Rehab Services Office: [phone] 11/20/2023   Delon Falter 11/20/2023, 9:58 AM

## 2023-11-20 NOTE — Progress Notes (Signed)
 Peripherally Inserted Central Catheter Placement  The IV Nurse has discussed with the patient and/or persons authorized to consent for the patient, the purpose of this procedure and the potential benefits and risks involved with this procedure.  The benefits include less needle sticks, lab draws from the catheter, and the patient may be discharged home with the catheter. Risks include, but not limited to, infection, bleeding, blood clot (thrombus formation), and puncture of an artery; nerve damage and irregular heartbeat and possibility to perform a PICC exchange if needed/ordered by physician.  Alternatives to this procedure were also discussed.  Bard Power PICC patient education guide, fact sheet on infection prevention and patient information card has been provided to patient /or left at bedside.    PICC Placement Documentation  PICC Double Lumen 11/20/23 Left Basilic 46 cm 0 cm (Active)  Indication for Insertion or Continuance of Line Administration of hyperosmolar/irritating solutions (i.e. TPN, Vancomycin, etc.) 11/20/23 1217  Exposed Catheter (cm) 0 cm 11/20/23 1217  Site Assessment Clean, Dry, Intact 11/20/23 1217  Lumen #1 Status Flushed;Blood return noted;Saline locked 11/20/23 1217  Lumen #2 Status Flushed;Blood return noted;Saline locked 11/20/23 1217  Dressing Type Transparent 11/20/23 1217  Dressing Status Antimicrobial disc/dressing in place 11/20/23 1217  Line Care Connections checked and tightened 11/20/23 1217  Line Adjustment (NICU/IV Team Only) No 11/20/23 1217  Dressing Intervention New dressing 11/20/23 1217  Dressing Change Due 11/27/23 11/20/23 1217   PICC inserted Left upper arm,Phlebitis noted at the lower forearm as shown to the primary RN right arm  and she's aware . Patient L arm can move up and down and veins are good.R upper arm assessed with ultrasound, veins are very small to measure. Wife is aware of the situation and educated that theres no limits in moving left  arm for better blood circulation.    David Mcgrath 11/20/2023, 12:19 PM

## 2023-11-20 NOTE — Progress Notes (Signed)
 Initial Nutrition Assessment  DOCUMENTATION CODES:   Severe malnutrition in context of acute illness/injury  INTERVENTION:  - Starting TPN tonight.   - TPN management per pharmacy.  - Monitor magnesium , potassium, and phosphorus daily for at least 3 days, MD to replete as needed, as pt is at risk for refeeding syndrome given severe acute malnutrition with inadequate oral intake x1 month and significant weight loss. - Add 100mg  thiamine  x5 days due to refeeding risk.  - Daily weights while on TPN.  NUTRITION DIAGNOSIS:   Severe Malnutrition related to acute illness (recurrent small bowel obstruction) as evidenced by severe fat depletion, severe muscle depletion, energy intake < or equal to 50% for > or equal to 5 days, percent weight loss (22% in 1 month).  GOAL:   Patient will meet greater than or equal to 90% of their needs  MONITOR:   Diet advancement, Labs, Weight trends  REASON FOR ASSESSMENT:   Consult New TPN/TNA  ASSESSMENT:   73 y.o. male with PMH significant for HTN, HLD, chronic anxiety/depression, polyneuropathy, multiple myeloma (diagnosed March 2025). Patient recently admitted both 7/12-7/18 and 7/31-8/3 with intractable nausea vomiting, recurrent small bowel obstruction and was treated conservatively with NG tube decompression and discharged home.  Presented back 8/10 with loose stools and cramping abdominal pain. Admitted for ileus vs partial SBO.  8/10 Admit 8/11 Soft diet 8/13 FLD 8/17 NPO; NGT placed  Patient reports a UBW of 250# and weight loss beginning in April.  Per chart review, patient weighed at 249# in March but question if this weight was copied over from the weight in January. Regardless, patient dropped to 212# by the beginning of April.  Weight then mostly stable until the beginning of July. Patient weighed at 227# on 7/3 and dropped to 177# by 8/7. This is a 50# or 22% weight loss in 1 month, which is significant and severe for the time  frame.  Current weight is elevated due to fluid, with patient noted to have moderate pitting generalized edema and deep pitting LUE and RLE edema.  Patient reports eating fairly well up until 1 month ago, when he began to have frequent N/V and was admitted for SBO on 7/12. He was again admitted on 7/31 for recurrent SBO and now dealing with the same issue.  He notes minimal intake since initial admission on 7/12 (~5 weeks ago).  Patient was initially eating fairly well at the beginning of this admission. Documented to be consuming 25-100% of meals on 8/12-8/13. No intake documented since.   Patient now NPO with NGT in place. Plan to start TPN tonight.  Discussed with pharmacy about adding thiamine  as suspect patient at high risk for refeeding syndrome given inadequate intake >1 month and significant weight loss.   Medications reviewed and include: D5 @ 51mL/hr (provides 306 kcals over 24 hours, ends @ 1759), D5 @ 17mL/hr (starting @ 1800, provides 143 kcals over 24 hours)  Labs reviewed:  Creatinine 1.34   NUTRITION - FOCUSED PHYSICAL EXAM:  Flowsheet Row Most Recent Value  Orbital Region Severe depletion  Upper Arm Region Moderate depletion  Thoracic and Lumbar Region Severe depletion  Buccal Region Moderate depletion  Temple Region Severe depletion  Clavicle Bone Region Severe depletion  Clavicle and Acromion Bone Region Severe depletion  Scapular Bone Region Unable to assess  Dorsal Hand Unable to assess  [edema]  Patellar Region Unable to assess  [edema]  Anterior Thigh Region Unable to assess  [edema]  Posterior Calf Region  Unable to assess  [edema]  Edema (RD Assessment) Severe  Hair Reviewed  Eyes Reviewed  Mouth Reviewed  Skin Reviewed  Nails Reviewed    Diet Order:   Diet Order             Diet NPO time specified  Diet effective now                   EDUCATION NEEDS:  Education needs have been addressed  Skin:  Skin Assessment: Skin Integrity  Issues: Skin Integrity Issues:: Stage I Stage I: Sacrum  Last BM:  8/16  Height:  Ht Readings from Last 1 Encounters:  11/18/23 6' 4 (1.93 m)   Weight:  Wt Readings from Last 1 Encounters:  11/18/23 94.6 kg   Ideal Body Weight:  91.8 kg  BMI:  Body mass index is 25.39 kg/m.  Estimated Nutritional Needs:  Kcal:  2400-2600 kcals Protein:  120-135 grams Fluid:  >/= 2.4L    Trude Ned RD, LDN Contact via Secure Chat.

## 2023-11-20 NOTE — TOC Progression Note (Signed)
 Transition of Care [facility]) - Progression Note    Patient Details  Name: David Mcgrath MRN: [id number] Date of Birth: 1950/11/03  [facility] CM/SW Contact  Jon ONEIDA Anon, RN Phone Number: 11/20/2023, 9:55 AM  Clinical Narrative:    Pt currently in ICU, requiring TPN due to pt having small bowel obstruction. Prior to admission pt was receiving HH services through [facility]. Will need ROC orders for HHPT/OT at discharge. Care Management continuing to follow.                     Expected Discharge Plan and Services                                               Social Drivers of Health (SDOH) Interventions SDOH Screenings   Food Insecurity: No Food Insecurity (11/13/2023)  Housing: Low Risk  (11/13/2023)  Transportation Needs: No Transportation Needs (11/13/2023)  Utilities: Not At Risk (11/13/2023)  Depression (PHQ2-9): Low Risk  (11/09/2023)  Social Connections: Socially Integrated (11/13/2023)  Tobacco Use: Low Risk  (11/12/2023)    Readmission Risk Interventions    11/05/2023   10:31 AM 10/15/2023   11:45 AM  Readmission Risk Prevention Plan  Transportation Screening Complete Complete  PCP or Specialist Appt within 3-5 Days Complete Complete  HRI or Home Care Consult Complete Complete  Social Work Consult for Recovery Care Planning/Counseling Complete Complete  Palliative Care Screening Not Applicable Not Applicable  Medication Review Oceanographer) Complete Complete

## 2023-11-20 NOTE — Progress Notes (Signed)
 PT Cancellation Note  Patient Details Name: David Mcgrath MRN: [id number] DOB: 05-23-1950   Cancelled Treatment:    Reason Eval/Treat Not Completed: Fatigue/lethargy limiting ability to participate;Patient at procedure or test/unavailable Getting Picc in AM , now resting. Darice Potters PT Acute Rehabilitation Services Office [phone]   Potters Darice Norris 11/20/2023, 2:44 PM

## 2023-11-20 NOTE — Telephone Encounter (Signed)
 *  STAT* If patient is at the pharmacy, call can be transferred to refill team.   1. Which medications need to be refilled? (please list name of each medication and dose if known)   apixaban  (ELIQUIS ) 5 MG TABS tablet   2. Which pharmacy/location (including street and city if local pharmacy) is medication to be sent to?  NEW PHARMACY ALERT:  [facility] [phone]  3. Do they need a 30 day or 90 day supply?   90 day

## 2023-11-20 NOTE — Progress Notes (Addendum)
 PHARMACY - ANTICOAGULATION CONSULT NOTE  Pharmacy Consult for heparin  Indication: atrial fibrillation  Allergies  Allergen Reactions   Shrimp [Shellfish Allergy] Anaphylaxis and Swelling    Swelling throat    Reglan  [Metoclopramide ] Other (See Comments)    caused tardive dyskinesia    Patient Measurements: Height: 6' 4 (193 cm) Weight: 94.6 kg (208 lb 8.9 oz) IBW/kg (Calculated) : 86.8 HEPARIN  DW (KG): 94.6  Vital Signs: Temp: 97.3 F (36.3 C) (08/18 1425) Temp Source: Oral (08/18 1425) BP: 154/51 (08/18 1300) Pulse Rate: 96 (08/18 1300)  Labs: Recent Labs    11/18/23 2323 11/19/23 0642 11/20/23 0244  HGB 9.7* 10.1* 11.1*  HCT 30.0* 30.5* 33.3*  PLT 234 223 261  CREATININE 1.10 1.53* 1.34*    Estimated Creatinine Clearance: 61.2 mL/min (A) (by C-G formula based on SCr of 1.34 mg/dL (H)).   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  Patient reports he does not have symptoms as of 02/25/14.     Diarrhea 06/04/2023   Essential hypertension 06/04/2023   Fatty liver 06/04/2023   Flu 06/04/2023   Hyperlipidemia 06/04/2023   Ileus (HCC) 06/04/2023   Multiple myeloma (HCC)    Neuropathy    Paroxysmal atrial fibrillation (HCC) 07/04/2023   Pneumonia 06/04/2023     Assessment: 73 year old male with afib on Eliquis  PTA which is currently on hold. Patient recently admitted for bowel obstruction and discharged home after conservative treatment with NGT decompression. He presented back with similar concerns, currently being treated for ileus vs pSBO, plan to start TPN given prolonged hospitalization without nutrition. His Eliquis  was initially resumed on admission and discontinued on 8/14. Has been on prophylactic Lovenox . Pharmacy consulted  for heparin  initiation and management.  Goal of Therapy:  Heparin  level 0.3-0.7 units/ml Monitor platelets by anticoagulation protocol: Yes   Plan:  -Given last dose of Lovenox  approximately one hour ago, will defer initial heparin  bolus -Start heparin  infusion at 1150 units/hr -Check heparin  level 8 hours after start of infusion -Daily CBC -Continue to follow for transition back to Eliquis  as appropriate  Addendum: baseline heparin  level > 1.10, expect this may be related to recent Lovenox  administration approximately 2 hours before, but given age and mild bump in SCr, will check both heparin  level and aPTT at 8 hours to better assess.    Stefano MARLA Bologna, PharmD, BCPS Clinical Pharmacist 11/20/2023 4:09 PM

## 2023-11-21 ENCOUNTER — Inpatient Hospital Stay (HOSPITAL_COMMUNITY)

## 2023-11-21 DIAGNOSIS — R230 Cyanosis: Secondary | ICD-10-CM

## 2023-11-21 DIAGNOSIS — I480 Paroxysmal atrial fibrillation: Secondary | ICD-10-CM | POA: Diagnosis not present

## 2023-11-21 DIAGNOSIS — K567 Ileus, unspecified: Secondary | ICD-10-CM | POA: Diagnosis not present

## 2023-11-21 DIAGNOSIS — K5669 Other partial intestinal obstruction: Secondary | ICD-10-CM | POA: Diagnosis not present

## 2023-11-21 LAB — BASIC METABOLIC PANEL WITH GFR
Anion gap: 5 (ref 5–15)
BUN: 20 mg/dL (ref 8–23)
CO2: 24 mmol/L (ref 22–32)
Calcium: 7.9 mg/dL — ABNORMAL LOW (ref 8.9–10.3)
Chloride: 107 mmol/L (ref 98–111)
Creatinine, Ser: 0.76 mg/dL (ref 0.61–1.24)
GFR, Estimated: >60 mL/min (ref >60)
Glucose, Bld: 127 mg/dL — ABNORMAL HIGH (ref 70–99)
Potassium: 4.1 mmol/L (ref 3.5–5.1)
Sodium: 136 mmol/L (ref 135–145)

## 2023-11-21 LAB — GLUCOSE, CAPILLARY
Glucose-Capillary: 104 mg/dL — ABNORMAL HIGH (ref 70–99)
Glucose-Capillary: 103 mg/dL — ABNORMAL HIGH (ref 70–99)
Glucose-Capillary: 109 mg/dL — ABNORMAL HIGH (ref 70–99)
Glucose-Capillary: 124 mg/dL — ABNORMAL HIGH (ref 70–99)

## 2023-11-21 LAB — CBC WITH DIFFERENTIAL/PLATELET
Abs Immature Granulocytes: 0.17 K/uL — ABNORMAL HIGH (ref 0.00–0.07)
Basophils Absolute: 0 K/uL (ref 0.0–0.1)
Basophils Relative: 0 %
Eosinophils Absolute: 0.1 K/uL (ref 0.0–0.5)
Eosinophils Relative: 0 %
HCT: 32.2 % — ABNORMAL LOW (ref 39.0–52.0)
Hemoglobin: 10.4 g/dL — ABNORMAL LOW (ref 13.0–17.0)
Immature Granulocytes: 1 %
Lymphocytes Relative: 3 %
Lymphs Abs: 0.5 K/uL — ABNORMAL LOW (ref 0.7–4.0)
MCH: 30.7 pg (ref 26.0–34.0)
MCHC: 32.3 g/dL (ref 30.0–36.0)
MCV: 95 fL (ref 80.0–100.0)
Monocytes Absolute: 0.5 K/uL (ref 0.1–1.0)
Monocytes Relative: 3 %
Neutro Abs: 16.9 K/uL — ABNORMAL HIGH (ref 1.7–7.7)
Neutrophils Relative %: 93 %
Platelets: 239 K/uL (ref 150–400)
RBC: 3.39 MIL/uL — ABNORMAL LOW (ref 4.22–5.81)
RDW: 17.6 % — ABNORMAL HIGH (ref 11.5–15.5)
Smear Review: NORMAL
WBC: 18.2 K/uL — ABNORMAL HIGH (ref 4.0–10.5)
nRBC: 0 % (ref 0.0–0.2)

## 2023-11-21 LAB — PHOSPHORUS: Phosphorus: 2.8 mg/dL (ref 2.5–4.6)

## 2023-11-21 LAB — MAGNESIUM: Magnesium: 1.8 mg/dL (ref 1.7–2.4)

## 2023-11-21 LAB — HEPARIN LEVEL (UNFRACTIONATED)
Heparin Unfractionated: 0.86 [IU]/mL — ABNORMAL HIGH (ref 0.30–0.70)
Heparin Unfractionated: 0.46 [IU]/mL (ref 0.30–0.70)

## 2023-11-21 LAB — APTT: aPTT: 71 s — ABNORMAL HIGH (ref 24–36)

## 2023-11-21 MED ORDER — TRAVASOL 10 % IV SOLN
INTRAVENOUS | Status: AC
  Filled 2023-11-21: qty 924

## 2023-11-21 MED ORDER — DEXTROSE IN LACTATED RINGERS 5 % IV SOLN: INTRAVENOUS | Status: DC

## 2023-11-21 MED ORDER — MAGNESIUM SULFATE 2 GM/50ML IV SOLN
2.0000 g | Freq: Once | INTRAVENOUS | Status: AC
  Administered 2023-11-21: 2 g via INTRAVENOUS
  Filled 2023-11-21: qty 50

## 2023-11-21 NOTE — Progress Notes (Signed)
   Subjective/Chief Complaint: Still with no flatus or BM yesterday, but NGT output down to 250cc in 24 hrs.  Fingers are much better today and no longer discolored.   Objective: Vital signs in last 24 hours: Temp:  [97.3 F (36.3 C)-98.1 F (36.7 C)] 98 F (36.7 C) (08/19 0800) Pulse Rate:  [61-99] 86 (08/19 0800) Resp:  [20-28] 28 (08/19 0800) BP: (132-195)/(44-81) 160/66 (08/19 0800) SpO2:  [98 %-100 %] 100 % (08/19 0800) Weight:  [97.8 kg] 97.8 kg (08/19 0540) Last BM Date : 11/20/23  Intake/Output from previous day: 08/18 0701 - 08/19 0700 In: 1242.4 [I.V.:1192.4; NG/GT:50] Out: 750 [Urine:500; Emesis/NG output:250] Intake/Output this shift: No intake/output data recorded.  Ab soft, ND, nontender, incisions clean, NGT cannister with minimal tan/watery type output.   Ext: ischemic changes to L 4/5th fingers much improved and mostly resolved.  Lab Results:  Recent Labs    11/20/23 0244 11/21/23 0629  WBC 14.2* 18.2*  HGB 11.1* 10.4*  HCT 33.3* 32.2*  PLT 261 239   BMET Recent Labs    11/20/23 0244 11/21/23 0629  NA 135 136  K 3.9 4.1  CL 105 107  CO2 23 24  GLUCOSE 115* 127*  BUN 23 20  CREATININE 1.34* 0.76  CALCIUM  7.6* 7.9*   PT/INR No results for input(s): LABPROT, INR in the last 72 hours. ABG No results for input(s): PHART, HCO3 in the last 72 hours.  Invalid input(s): PCO2, PO2  Studies/Results: US  EKG SITE RITE Result Date: 11/20/2023 If Site Rite image not attached, placement could not be confirmed due to current cardiac rhythm.   Anti-infectives: Anti-infectives (From admission, onward)    Start     Dose/Rate Route Frequency Ordered Stop   11/18/23 1200  erythromycin  (E-MYCIN ) tablet 250 mg  Status:  Discontinued        250 mg Oral 2 times daily with meals 11/18/23 0720 11/19/23 0734   11/13/23 1000  acyclovir  (ZOVIRAX ) tablet 400 mg  Status:  Discontinued        400 mg Oral 2 times daily 11/13/23 0503 11/19/23 0739        Assessment/Plan: POD 18 dx lsc for possible sbo-negative, ? ileus -still with symptoms, operating surgeon mentioned the possibility that he has a floppy mesentery and he could be intermittently volvulizing, but unclear.  Either way, pexy of small bowel does not work. -GI weighed in that cancer med could cause ileus type response.  This has been DC. -they did not recommend any specific medication treatment for his possible dysmotility at this time -doubt surgical role at this time -WBC 18K, monitor, cr normalized -given decrease in output, will try to clamp NGT today and see how he does. -cont TNA for nutritional support  FEN - NPO x sips and chips/NGT clamped/IVFs/TNA VTE - Lovenox  ID - none  MM PCM - TNA AKI HTN HLD A fib  11/21/2023

## 2023-11-21 NOTE — Progress Notes (Signed)
 Progress Note    David Mcgrath   FMW:[id number]  DOB: Nov 17, 1950  DOA: 11/12/2023     8 PCP: [name], DO  Initial CC: Abdominal pain  Hospital Course: 73 y.o. male with medical history significant for hypertension, hyperlipidemia, paroxysmal A-fib on Eliquis , chronic anxiety/depression, polyneuropathy, multiple myeloma (diagnosed March 2025) received Bortezomib  infusion 11/09/2023. Patient was admitted here 11/02/2023 through 11/05/2023 with intractable nausea vomiting, recurrent small bowel obstruction.  He was treated conservatively with NG tube decompression and discharged home.  Last admission CT abdomen/pelvis with contrast with slight improvement in small bowel dilation when compared to prior exam but with mild transition point deep in the pelvis, no pancreatic ductal dilation, no inflammatory changes, stable free fluid within the abdomen.  General surgery was consulted and patient underwent diagnostic laparoscopy by Dr. [name] on 11/03/2023 findings of intermittently dilated small bowel with erythema of the serosa consistent with inflammation/edema, intermittent injection of the small bowel mesentery, small bowel with tendency to migrate to the left upper quadrant with long/mobile small bowel mesentery with suspicion/potential for intermittent volvulized small bowel contributing to his symptoms and findings on imaging.  Diet was slowly advanced with toleration.    This admission CT abdomen shows increasing small bowel dilatation and fecalization of bowel contents involving the jejunum. No definitive transition point is identified at this time but caliber change is noted in the right mid abdomen at the junction of the jejunum and ileum. The more distal ileum is unremarkable. Stable ground-glass opacities in the lung bases similar to that seen on prior exams.   Assessment/Plan:   Ileus versus partial small bowel obstruction Patient presented with nausea vomiting and diarrhea.  CT  scan raise concern for ileus versus partial SBO. Patient was given symptomatic treatment.  NGT was not placed initially Patient initially started improving but then had recurrence of his nausea and vomiting.  CT scan was repeated which showed persistent small bowel dilatation with prominent jejunum as seen before. - Surgery also following, appreciate assistance - Minimal ambulation due to his weakness also -Trial of erythromycin  started on 11/18/2023 but not successful; began having large biliary emesis on 8/16 - NGT placed 8/16 - continue NPO, IVF for now - GI consulted but no major rec's aside from current treatment plan in place - surgery to discuss if need for further repeat laparoscopy  - given prolonged hospitalization with minimal to no nutrition, now warrants TPN; PICC placed 8/18 and TPN started - for now, clamp trial per surgery for potential diet advancement; no plans for surgery at this time  Hypotension - resolved  Lactic acidosis - resolved  Leukocytosis - large GI losses on 8/16 with emesis episode, then became hypotensive and lactic elevated - s/p fluid boluses overnight and started on maintenance fluid -Lactic now normalized 8/17; low suspicion for infection; this appears hypovolemia for now - s/p IVF - continue trending CBC; holding off on abx at this time   Diarrhea Reports semiformed stools.   - GI pathogen panel negative -Colonized with C. difficile but testing negative for active infection (antigen positive, toxin negative, PCR negative)  Acute urinary retention Foley catheter was placed on 8/11.  Voiding trial once he is more stable and/or stronger   Hypotension Resolved with IV fluids   History of multiple myeloma Followed by medical oncology, Dr. Federico.  Some of his symptoms are thought to be due to his chemotherapeutic medications.  PAF - Eliquis  is currently on hold in case he needs surgical intervention.   -  Currently not on any rate limiting  medications.  Home medication list reviewed.  He is on metoprolol  but only as needed. - Continue to monitor on telemetry. - starting heparin  while awaiting potential surgery options and while off eliquis   Left fingers discoloration - noticed on 8/18 but wife states was present on 8/17 some and actually was improved on 8/18 - Concern was for cyanosis as fingertips were also cold -Had PICC line placed in left arm on 8/18 however wife states discoloration was present prior; there was also some extravasation from a prior IV in the left arm as well but unclear what was transfusing as happened prior to transfer to the ICU -Left arterial Doppler ordered and shows no obstruction in the left upper extremity with triphasic waveforms - Also evaluated by vascular surgery, greatly appreciate assistance - Finger discoloration has resolved as of 8/19 - Okay to discontinue neurovascular checks - Continue monitoring as necessary   Normocytic anemia Stable hemoglobin noted.   Hyponatremia/hypokalemia/hypomagnesemia Potassium was supplemented.  Sodium level remains low.  There could be an element of SIADH.  Continue to monitor.  Supplement magnesium .   Hypoglycemia Probably from poor oral intake.  Started on D5 infusion with improvement in glucose levels.   Chronic anxiety and depression Continue psychotropic medications.   Physical deconditioning PT and OT.  Interval History:  Feeling okay this morning.  Wife also present bedside.  Left finger discoloration has resolved and left hand is well-perfused and warm.  Arterial Doppler also reassuring. PICC line placed and TPN started yesterday.  Surgery planning on clamp trial today.  Old records reviewed in assessment of this patient  Antimicrobials:   DVT prophylaxis:  Heparin  drip   Code Status:   Code Status: Full Code  Mobility Assessment (Last 72 Hours)     Mobility Assessment     Row Name 11/21/23 0830 11/20/23 2000 11/20/23 0956  11/20/23 0800 11/19/23 2032   Does the patient have exclusion criteria? No - Perform mobility assessment No - Perform mobility assessment -- No - Perform mobility assessment No - Perform mobility assessment   What is the highest level of mobility based on the mobility assessment? Level 1 (Bedfast) - Unable to balance while sitting on edge of bed Level 1 (Bedfast) - Unable to balance while sitting on edge of bed Level 1 (Bedfast) - Unable to balance while sitting on edge of bed Level 2 (Chairfast) - Balance while sitting on edge of bed and cannot stand Level 2 (Chairfast) - Balance while sitting on edge of bed and cannot stand   Is the above level different from baseline mobility prior to current illness? Yes - Recommend PT order Yes - Recommend PT order -- Yes - Recommend PT order Yes - Recommend PT order    Row Name 11/19/23 0800           Does the patient have exclusion criteria? No - Perform mobility assessment       What is the highest level of mobility based on the mobility assessment? Level 2 (Chairfast) - Balance while sitting on edge of bed and cannot stand          Barriers to discharge: none Disposition Plan:  Home HH orders placed: n/a Status is: Inpt  Objective: Blood pressure 132/85, pulse 87, temperature 97.7 F (36.5 C), temperature source Oral, resp. rate (!) 25, height 6' 4 (1.93 m), weight 97.8 kg, SpO2 100%.  Examination:  Physical Exam Constitutional:      General: He  is not in acute distress.    Appearance: Normal appearance.  HENT:     Head: Normocephalic and atraumatic.     Comments: NG tube now in place    Mouth/Throat:     Mouth: Mucous membranes are moist.  Eyes:     Extraocular Movements: Extraocular movements intact.  Cardiovascular:     Rate and Rhythm: Normal rate and regular rhythm.  Pulmonary:     Effort: Pulmonary effort is normal. No respiratory distress.     Breath sounds: Normal breath sounds. No wheezing.  Abdominal:     General: Bowel  sounds are absent. There is no distension.     Palpations: Abdomen is soft.     Tenderness: There is no abdominal tenderness.  Musculoskeletal:        General: Normal range of motion.     Cervical back: Normal range of motion and neck supple.     Comments: Left hand fingers now warm, well-perfused, no further cyanosis  Skin:    General: Skin is warm and dry.  Neurological:     General: No focal deficit present.     Mental Status: He is alert.  Psychiatric:        Mood and Affect: Mood normal.        Behavior: Behavior normal.      Consultants:  General surgery GI  Procedures:  11/20/2023: PICC line placement  Data Reviewed: Results for orders placed or performed during the hospital encounter of 11/12/23 (from the past 24 hours)  Glucose, capillary     Status: None   Collection Time: 11/20/23  5:08 PM  Result Value Ref Range   Glucose-Capillary 91 70 - 99 mg/dL  Heparin  level (unfractionated)     Status: Abnormal   Collection Time: 11/20/23  5:10 PM  Result Value Ref Range   Heparin  Unfractionated >1.10 (H) 0.30 - 0.70 IU/mL  Glucose, capillary     Status: Abnormal   Collection Time: 11/20/23 11:52 PM  Result Value Ref Range   Glucose-Capillary 123 (H) 70 - 99 mg/dL  Glucose, capillary     Status: Abnormal   Collection Time: 11/21/23  5:34 AM  Result Value Ref Range   Glucose-Capillary 104 (H) 70 - 99 mg/dL  Basic metabolic panel with GFR     Status: Abnormal   Collection Time: 11/21/23  6:29 AM  Result Value Ref Range   Sodium 136 135 - 145 mmol/L   Potassium 4.1 3.5 - 5.1 mmol/L   Chloride 107 98 - 111 mmol/L   CO2 24 22 - 32 mmol/L   Glucose, Bld 127 (H) 70 - 99 mg/dL   BUN 20 8 - 23 mg/dL   Creatinine, Ser 9.23 0.61 - 1.24 mg/dL   Calcium  7.9 (L) 8.9 - 10.3 mg/dL   GFR, Estimated >39 >39 mL/min   Anion gap 5 5 - 15  CBC with Differential/Platelet     Status: Abnormal   Collection Time: 11/21/23  6:29 AM  Result Value Ref Range   WBC 18.2 (H) 4.0 - 10.5  K/uL   RBC 3.39 (L) 4.22 - 5.81 MIL/uL   Hemoglobin 10.4 (L) 13.0 - 17.0 g/dL   HCT 67.7 (L) 60.9 - 47.9 %   MCV 95.0 80.0 - 100.0 fL   MCH 30.7 26.0 - 34.0 pg   MCHC 32.3 30.0 - 36.0 g/dL   RDW 82.3 (H) 88.4 - 84.4 %   Platelets 239 150 - 400 K/uL   nRBC 0.0 0.0 -  0.2 %   Neutrophils Relative % 93 %   Neutro Abs 16.9 (H) 1.7 - 7.7 K/uL   Lymphocytes Relative 3 %   Lymphs Abs 0.5 (L) 0.7 - 4.0 K/uL   Monocytes Relative 3 %   Monocytes Absolute 0.5 0.1 - 1.0 K/uL   Eosinophils Relative 0 %   Eosinophils Absolute 0.1 0.0 - 0.5 K/uL   Basophils Relative 0 %   Basophils Absolute 0.0 0.0 - 0.1 K/uL   WBC Morphology MORPHOLOGY UNREMARKABLE    Smear Review Normal platelet morphology    Immature Granulocytes 1 %   Abs Immature Granulocytes 0.17 (H) 0.00 - 0.07 K/uL   Burr Cells PRESENT    Ovalocytes PRESENT   Magnesium      Status: None   Collection Time: 11/21/23  6:29 AM  Result Value Ref Range   Magnesium  1.8 1.7 - 2.4 mg/dL  Phosphorus     Status: None   Collection Time: 11/21/23  6:29 AM  Result Value Ref Range   Phosphorus 2.8 2.5 - 4.6 mg/dL  Heparin  level (unfractionated)     Status: Abnormal   Collection Time: 11/21/23  6:29 AM  Result Value Ref Range   Heparin  Unfractionated 0.86 (H) 0.30 - 0.70 IU/mL  APTT     Status: Abnormal   Collection Time: 11/21/23  6:29 AM  Result Value Ref Range   aPTT 71 (H) 24 - 36 seconds  Glucose, capillary     Status: Abnormal   Collection Time: 11/21/23 11:58 AM  Result Value Ref Range   Glucose-Capillary 103 (H) 70 - 99 mg/dL    I have reviewed pertinent nursing notes, vitals, labs, and images as necessary. I have ordered labwork to follow up on as indicated.  I have reviewed the last notes from staff over past 24 hours. I have discussed patient's care plan and test results with nursing staff, CM/SW, and other staff as appropriate.  Time spent: Greater than 50% of the 55 minute visit was spent in counseling/coordination of care for  the patient as laid out in the A&P.   LOS: 8 days   Alm Apo, MD [facility] 11/21/2023, 2:04 PM

## 2023-11-21 NOTE — Progress Notes (Signed)
 Physical Therapy Treatment Patient Details Name: David Mcgrath MRN: [id number] DOB: 1950-07-27 Today's Date: 11/21/2023   History of Present Illness David Mcgrath is a 73 y.o. male with  who presents to the ER with complaints of hypotension associated with cramping abdominal pain and poor oral intake for the past few days. PMH: hypertension, hyperlipidemia, paroxysmal A-fib on Eliquis , chronic anxiety/depression, polyneuropathy, multiple myeloma (diagnosed March 2025), recently admitted, from 7/31 through 11/05/2023, due to intractable nausea and vomiting from recurrent small bowel obstruction, had a diagnostic laparoscopy    PT Comments  The patient is amenable to sitting onto bed edge, did not want to stand attempt pivot( patient most likely unable with weakness).  Patient sat EOB x 10 with close supervision, assisting self with legs using both hands , and performed seated exercises for LE's.  Continue PT. Patient will benefit from continued inpatient follow up therapy, <3 hours/day unless recovers strength  and endurance    If plan is discharge home, recommend the following: Assistance with cooking/housework;Help with stairs or ramp for entrance;A little help with bathing/dressing/bathroom;Assist for transportation;Two people to help with walking and/or transfers   Can travel by private vehicle     No  Equipment Recommendations  None recommended by PT    Recommendations for Other Services       Precautions / Restrictions Precautions Precautions: Fall Precaution/Restrictions Comments: NG,  rectal collection Restrictions Weight Bearing Restrictions Per Provider Order: No     Mobility  Bed Mobility   Bed Mobility: Sit to Supine, Sidelying to Sit   Sidelying to sit: Mod assist, HOB elevated, Used rails   Sit to supine: +2 for safety/equipment, +2 for physical assistance, Max assist   General bed mobility comments: patient able to move legs to right EOB, with  min assistance, Mod support to raise trunk to upright, patient pushing up to elbow. Patient using hands to move each leg closer to bed edge and weight shift. Max support for legs and  trunk to return to suppine    Transfers                   General transfer comment: NT    Ambulation/Gait                   Stairs             Wheelchair Mobility     Tilt Bed    Modified Rankin (Stroke Patients Only)       Balance Overall balance assessment: Needs assistance Sitting-balance support: Bilateral upper extremity supported, Feet supported Sitting balance-Leahy Scale: Fair Sitting balance - Comments: Pt tolerated sitting EOB 10 min and pt initiated LE exercises with LAQs and ankle pumps.Encouraged UE elevation and looking ahead as head tends to be  flexed down                                    Communication Communication Communication: No apparent difficulties  Cognition Arousal: Alert Behavior During Therapy: WFL for tasks assessed/performed, Flat affect   PT - Cognitive impairments: No apparent impairments                         Following commands: Intact      Cueing    Exercises      General Comments        Pertinent Vitals/Pain Pain Assessment  Pain Assessment: No/denies pain Pain Location: except when NG ig moved    Home Living                          Prior Function            PT Goals (current goals can now be found in the care plan section) Progress towards PT goals: Progressing toward goals    Frequency    Min 2X/week      PT Plan      Co-evaluation              AM-PAC PT 6 Clicks Mobility   Outcome Measure  Help needed turning from your back to your side while in a flat bed without using bedrails?: A Little Help needed moving from lying on your back to sitting on the side of a flat bed without using bedrails?: A Lot Help needed moving to and from a bed to a chair  (including a wheelchair)?: Total Help needed standing up from a chair using your arms (e.g., wheelchair or bedside chair)?: Total Help needed to walk in hospital room?: Total Help needed climbing 3-5 steps with a railing? : Total 6 Click Score: 9    End of Session   Activity Tolerance: Patient limited by fatigue;Patient tolerated treatment well Patient left: in bed;with call bell/phone within reach;with bed alarm set Nurse Communication: Mobility status;Need for lift equipment PT Visit Diagnosis: Unsteadiness on feet (R26.81);Other abnormalities of gait and mobility (R26.89);Muscle weakness (generalized) (M62.81)     Time: 8669-8643 PT Time Calculation (min) (ACUTE ONLY): 26 min  Charges:    $Therapeutic Activity: 23-37 mins PT General Charges $$ ACUTE PT VISIT: 1 Visit                     Darice Potters PT Acute Rehabilitation Services Office [phone]    Potters Darice Norris 11/21/2023, 2:59 PM

## 2023-11-21 NOTE — Progress Notes (Signed)
 PHARMACY - TOTAL PARENTERAL NUTRITION CONSULT NOTE   Indication: Bowel Obstruction  Patient Measurements: Height: 6' 4 (193 cm) Weight: 97.8 kg (215 lb 9.8 oz) IBW/kg (Calculated) : 86.8 TPN AdjBW (KG): 94.6 Body mass index is 26.24 kg/m. Usual Weight:   Assessment:  Pharmacy is consulted to start TPN on 73 yo male diagnosed with bowel obstruction. This admission CT abdomen shows increasing small bowel dilatation and fecalization of bowel contents involving the jejunum. No definitive transition point is identified at this time but caliber change is noted in the right mid abdomen at the junction of the jejunum and ileum. The more distal ileum is unremarkable. Some suspicion that bowel obstruction may be related to bortezomib  treatment pt receiving for multiple myeloma.   Glucose / Insulin :  - no hx of DM - CBG 91-123 since start of TPN  - 0 units of insulin  given  Electrolytes:  - Magnesium  is 1.8  - Electrolytes are WNL including CorrCa at 9.4 Renal:  - Scr now improved to <1  Hepatic:  - Total I/0 492 mL, 250 mL though NG tube, plan to clamp today  - LFTs, alk phos, bilirubin WNL  Intake / Output; MIVF:  - D5LR at 35 ml/hr  GI Imaging: -  Admitted from 10/14/23-10/20/23 for symptoms related to small bowel obstruction (transition point suspected in the right hemi-abdomen on CT imaging) and received non-operative management.  - Admitted 11/02/23 - 11/05/2023 for small bowel dilatation/concern for small bowel obstruction. CT imaging showed persistent small bowel dilatation with mild transition point noted deep in pelvis. He went to OR 11/03/23, findings were suspicious for intermittent volvulus of small bowel to explain his symptoms and CT imaging.  - 8/10  CT abdomen shows increasing small bowel dilatation and fecalization of bowel contents involving the jejunum. No definitive transition point is identified at this time but caliber change is noted in the right mid abdomen at the junction of  the jejunum and ileum.  GI Surgeries / Procedures:  8/1: to OR 11/03/23, findings were suspicious for intermittent volvulus of small bowel to explain his symptoms and CT imaging. Central access:   8/18  Nutritional Goals: Goal TPN rate is 100  mL/hr (provides 132  g of protein and 2438 kcals per day)  RD Assessment: from 8/18  Estimated Needs Total Energy Estimated Needs: 2400-2600 kcals Total Protein Estimated Needs: 120-135 grams Total Fluid Estimated Needs: >/= 2.4L  Current Nutrition:  NPO  Plan:  Increase TPN to 70 mL/hr at 1800 Electrolytes in TPN:  Na 71mEq/L K 50mEq/L  Ca 5mEq/L Mg inc to 8 mEq/L Phos 84mmol/L.  Cl:Ac 1:1 Add standard MVI and trace elements to TPN Initiate Sensitive q6h SSI and adjust as needed  Thiamine  100 mg IV daily x 5 days through 8/22 started  Reduce MIVF to KVO mL/hr at 1800 BMP, magnesium , phosphorus with AM labs  Monitor TPN labs on Mon/Thurs   Dolphus Roller, PharmD, BCPS 11/21/2023 9:16 AM

## 2023-11-21 NOTE — Progress Notes (Signed)
 PHARMACY - ANTICOAGULATION CONSULT NOTE  Pharmacy Consult for heparin  Indication: atrial fibrillation  Allergies  Allergen Reactions   Shrimp [Shellfish Allergy] Anaphylaxis and Swelling    Swelling throat    Reglan  [Metoclopramide ] Other (See Comments)    caused tardive dyskinesia    Patient Measurements: Height: 6' 4 (193 cm) Weight: 97.8 kg (215 lb 9.8 oz) IBW/kg (Calculated) : 86.8 HEPARIN  DW (KG): 94.6  Vital Signs: Temp: 98 F (36.7 C) (08/19 0800) Temp Source: Oral (08/19 0800) BP: 160/66 (08/19 0800) Pulse Rate: 86 (08/19 0800)  Labs: Recent Labs    11/19/23 0642 11/20/23 0244 11/20/23 1710 11/21/23 0629  HGB 10.1* 11.1*  --  10.4*  HCT 30.5* 33.3*  --  32.2*  PLT 223 261  --  239  APTT  --   --   --  71*  HEPARINUNFRC  --   --  >1.10* 0.86*  CREATININE 1.53* 1.34*  --  0.76    Estimated Creatinine Clearance: 102.5 mL/min (by C-G formula based on SCr of 0.76 mg/dL).   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  Patient reports he does not have symptoms as of 02/25/14.     Diarrhea 06/04/2023   Essential hypertension 06/04/2023   Fatty liver 06/04/2023   Flu 06/04/2023   Hyperlipidemia 06/04/2023   Ileus (HCC) 06/04/2023   Multiple myeloma (HCC)    Neuropathy    Paroxysmal atrial fibrillation (HCC) 07/04/2023   Pneumonia 06/04/2023     Assessment: 73 year old male with afib on Eliquis  PTA which is currently on hold. Patient recently admitted for bowel obstruction and discharged home after conservative treatment with NGT decompression. He presented back with similar concerns, currently being treated for ileus vs pSBO, plan to start TPN given prolonged hospitalization without nutrition. His Eliquis  was initially resumed  on admission and discontinued on 8/14. Has been on prophylactic Lovenox . Pharmacy consulted for heparin  initiation and management.  Today, 11/21/23 Hgb 10.4, plt 239 Scr is 0.76 mg/dl, improved after elevation last few days aPTT 71 HL is 0.86   Goal of Therapy:  Heparin  level 0.3-0.7 units/ml Monitor platelets by anticoagulation protocol: Yes   Plan:  - Decrease heparin  infusion to  1050 units/hr -Check heparin  level 8 hours after rate change  -Daily CBC -Continue to follow for transition back to Eliquis  as appropriate   Yanelis Osika, PharmD, BCPS 11/21/2023 8:39 AM

## 2023-11-21 NOTE — Plan of Care (Signed)
  Problem: Education: Goal: Knowledge of General Education information will improve Description: Including pain rating scale, medication(s)/side effects and non-pharmacologic comfort measures Outcome: Progressing   Problem: Health Behavior/Discharge Planning: Goal: Ability to manage health-related needs will improve Outcome: Progressing   Problem: Clinical Measurements: Goal: Ability to maintain clinical measurements within normal limits will improve Outcome: Progressing Goal: Will remain free from infection Outcome: Progressing   Problem: Activity: Goal: Risk for activity intolerance will decrease Outcome: Progressing   Problem: Coping: Goal: Level of anxiety will decrease Outcome: Progressing   Problem: Elimination: Goal: Will not experience complications related to bowel motility Outcome: Progressing Goal: Will not experience complications related to urinary retention Outcome: Progressing   Problem: Pain Managment: Goal: General experience of comfort will improve and/or be controlled Outcome: Progressing

## 2023-11-21 NOTE — Progress Notes (Signed)
 Left upper extremity arterial duplex has been completed. Preliminary results can be found in CV Proc through chart review.   11/21/23 10:41 AM Cathlyn Collet RVT

## 2023-11-21 NOTE — Progress Notes (Signed)
 PHARMACY - ANTICOAGULATION CONSULT NOTE  Pharmacy Consult for heparin  Indication: atrial fibrillation  Allergies  Allergen Reactions   Shrimp [Shellfish Allergy] Anaphylaxis and Swelling    Swelling throat    Reglan  [Metoclopramide ] Other (See Comments)    caused tardive dyskinesia    Patient Measurements: Height: 6' 4 (193 cm) Weight: 97.8 kg (215 lb 9.8 oz) IBW/kg (Calculated) : 86.8 HEPARIN  DW (KG): 94.6  Vital Signs: Temp: 97.7 F (36.5 C) (08/19 1200) Temp Source: Oral (08/19 1200) BP: 119/72 (08/19 1700) Pulse Rate: 86 (08/19 1700)  Labs: Recent Labs    11/19/23 0642 11/20/23 0244 11/20/23 1710 11/21/23 0629 11/21/23 1804  HGB 10.1* 11.1*  --  10.4*  --   HCT 30.5* 33.3*  --  32.2*  --   PLT 223 261  --  239  --   APTT  --   --   --  71*  --   HEPARINUNFRC  --   --  >1.10* 0.86* 0.46  CREATININE 1.53* 1.34*  --  0.76  --     Estimated Creatinine Clearance: 102.5 mL/min (by C-G formula based on SCr of 0.76 mg/dL).   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  Patient reports he does not have symptoms as of 02/25/14.     Diarrhea 06/04/2023   Essential hypertension 06/04/2023   Fatty liver 06/04/2023   Flu 06/04/2023   Hyperlipidemia 06/04/2023   Ileus (HCC) 06/04/2023   Multiple myeloma (HCC)    Neuropathy    Paroxysmal atrial fibrillation (HCC) 07/04/2023   Pneumonia 06/04/2023     Assessment: 73 year old male with afib on Eliquis  PTA which is currently on hold. Patient recently admitted for bowel obstruction and discharged home after conservative treatment with NGT decompression. He presented back with similar concerns, currently being treated for ileus vs pSBO, plan to start TPN given prolonged hospitalization without  nutrition. His Eliquis  was initially resumed on admission and discontinued on 8/14. Has been on prophylactic Lovenox . Pharmacy consulted for heparin  initiation and management.  Today, 11/21/23 Heparin  level 0.46 - therapeutic with heparin  infusing at 1050 units/hr Hgb 10.4, plt 239 No complications of therapy noted   Goal of Therapy:  Heparin  level 0.3-0.7 units/ml Monitor platelets by anticoagulation protocol: Yes   Plan:  -Continue heparin  infusion at 1050 units/hr -Daily CBC and heparin  level -Continue to follow for transition back to Eliquis  as appropriate   Stefano MARLA Bologna, PharmD, BCPS Clinical Pharmacist 11/21/2023 6:38 PM

## 2023-11-22 ENCOUNTER — Other Ambulatory Visit: Payer: Self-pay | Admitting: Hematology and Oncology

## 2023-11-22 DIAGNOSIS — R112 Nausea with vomiting, unspecified: Secondary | ICD-10-CM | POA: Diagnosis not present

## 2023-11-22 DIAGNOSIS — E43 Unspecified severe protein-calorie malnutrition: Secondary | ICD-10-CM

## 2023-11-22 DIAGNOSIS — R935 Abnormal findings on diagnostic imaging of other abdominal regions, including retroperitoneum: Secondary | ICD-10-CM | POA: Diagnosis not present

## 2023-11-22 DIAGNOSIS — C9000 Multiple myeloma not having achieved remission: Secondary | ICD-10-CM

## 2023-11-22 DIAGNOSIS — K567 Ileus, unspecified: Secondary | ICD-10-CM | POA: Diagnosis not present

## 2023-11-22 DIAGNOSIS — I480 Paroxysmal atrial fibrillation: Secondary | ICD-10-CM | POA: Diagnosis not present

## 2023-11-22 DIAGNOSIS — I10 Essential (primary) hypertension: Secondary | ICD-10-CM

## 2023-11-22 DIAGNOSIS — K5669 Other partial intestinal obstruction: Secondary | ICD-10-CM | POA: Diagnosis not present

## 2023-11-22 DIAGNOSIS — E876 Hypokalemia: Secondary | ICD-10-CM | POA: Diagnosis not present

## 2023-11-22 DIAGNOSIS — E785 Hyperlipidemia, unspecified: Secondary | ICD-10-CM

## 2023-11-22 LAB — CBC WITH DIFFERENTIAL/PLATELET
Abs Immature Granulocytes: 0.16 K/uL — ABNORMAL HIGH (ref 0.00–0.07)
Basophils Absolute: 0 K/uL (ref 0.0–0.1)
Basophils Relative: 0 %
Eosinophils Absolute: 0.1 K/uL (ref 0.0–0.5)
Eosinophils Relative: 1 %
HCT: 29.9 % — ABNORMAL LOW (ref 39.0–52.0)
Hemoglobin: 9.7 g/dL — ABNORMAL LOW (ref 13.0–17.0)
Immature Granulocytes: 1 %
Lymphocytes Relative: 4 %
Lymphs Abs: 0.5 K/uL — ABNORMAL LOW (ref 0.7–4.0)
MCH: 30.7 pg (ref 26.0–34.0)
MCHC: 32.4 g/dL (ref 30.0–36.0)
MCV: 94.6 fL (ref 80.0–100.0)
Monocytes Absolute: 0.6 K/uL (ref 0.1–1.0)
Monocytes Relative: 4 %
Neutro Abs: 12.4 K/uL — ABNORMAL HIGH (ref 1.7–7.7)
Neutrophils Relative %: 90 %
Platelets: 209 K/uL (ref 150–400)
RBC: 3.16 MIL/uL — ABNORMAL LOW (ref 4.22–5.81)
RDW: 17.3 % — ABNORMAL HIGH (ref 11.5–15.5)
WBC: 13.8 K/uL — ABNORMAL HIGH (ref 4.0–10.5)
nRBC: 0 % (ref 0.0–0.2)

## 2023-11-22 LAB — BASIC METABOLIC PANEL WITH GFR
Anion gap: 5 (ref 5–15)
BUN: 17 mg/dL (ref 8–23)
CO2: 25 mmol/L (ref 22–32)
Calcium: 7.6 mg/dL — ABNORMAL LOW (ref 8.9–10.3)
Chloride: 103 mmol/L (ref 98–111)
Creatinine, Ser: 0.49 mg/dL — ABNORMAL LOW (ref 0.61–1.24)
GFR, Estimated: >60 mL/min (ref >60)
Glucose, Bld: 127 mg/dL — ABNORMAL HIGH (ref 70–99)
Potassium: 3.6 mmol/L (ref 3.5–5.1)
Sodium: 133 mmol/L — ABNORMAL LOW (ref 135–145)

## 2023-11-22 LAB — GLUCOSE, CAPILLARY
Glucose-Capillary: 125 mg/dL — ABNORMAL HIGH (ref 70–99)
Glucose-Capillary: 112 mg/dL — ABNORMAL HIGH (ref 70–99)

## 2023-11-22 LAB — PHOSPHORUS: Phosphorus: 2.7 mg/dL (ref 2.5–4.6)

## 2023-11-22 LAB — MAGNESIUM: Magnesium: 1.9 mg/dL (ref 1.7–2.4)

## 2023-11-22 LAB — HEPARIN LEVEL (UNFRACTIONATED): Heparin Unfractionated: 0.36 [IU]/mL (ref 0.30–0.70)

## 2023-11-22 MED ORDER — TRAVASOL 10 % IV SOLN
INTRAVENOUS | Status: AC
  Filled 2023-11-22: qty 1320

## 2023-11-22 MED ORDER — POTASSIUM PHOSPHATES 15 MMOLE/5ML IV SOLN
30.0000 mmol | Freq: Once | INTRAVENOUS | Status: AC
  Administered 2023-11-22: 30 mmol via INTRAVENOUS
  Filled 2023-11-22: qty 10

## 2023-11-22 MED ORDER — MAGNESIUM SULFATE 2 GM/50ML IV SOLN
2.0000 g | Freq: Once | INTRAVENOUS | Status: AC
  Administered 2023-11-22: 2 g via INTRAVENOUS
  Filled 2023-11-22: qty 50

## 2023-11-22 NOTE — Progress Notes (Signed)
 PT Cancellation Note  Patient Details Name: David Mcgrath MRN: [id number] DOB: 21-Apr-1950   Cancelled Treatment:    Reason Eval/Treat Not Completed: Medical issues which prohibited therapy Had NG tube replaced, not feeling well. David Mcgrath PT Acute Rehabilitation Services Office [phone]   David Mcgrath 11/22/2023, 12:44 PM

## 2023-11-22 NOTE — Progress Notes (Signed)
 PROGRESS NOTE    David Mcgrath  FMW:[id number] DOB: 01-27-1951 DOA: 11/12/2023 PCP: [name], DO   Brief Narrative:  73 y.o. male with medical history significant for hypertension, hyperlipidemia, paroxysmal A-fib on Eliquis , chronic anxiety/depression, polyneuropathy, multiple myeloma (diagnosed March 2025) received Bortezomib  infusion 11/09/2023. Patient was admitted here 11/02/2023 through 11/05/2023 with intractable nausea vomiting, recurrent small bowel obstruction.  He was treated conservatively with NG tube decompression and discharged home.  Last admission CT abdomen/pelvis with contrast with slight improvement in small bowel dilation when compared to prior exam but with mild transition point deep in the pelvis, no pancreatic ductal dilation, no inflammatory changes, stable free fluid within the abdomen.  General surgery was consulted and patient underwent diagnostic laparoscopy by Dr. [name] on 11/03/2023 findings of intermittently dilated small bowel with erythema of the serosa consistent with inflammation/edema, intermittent injection of the small bowel mesentery, small bowel with tendency to migrate to the left upper quadrant with long/mobile small bowel mesentery with suspicion/potential for intermittent volvulized small bowel contributing to his symptoms and findings on imaging.  Diet was slowly advanced with toleration.    This admission CT abdomen shows increasing small bowel dilatation and fecalization of bowel contents involving the jejunum. No definitive transition point is identified at this time but caliber change is noted in the right mid abdomen at the junction of the jejunum and ileum. The more distal ileum is unremarkable. Stable ground-glass opacities in the lung bases similar to that seen on prior exams.   **Interim History Has had a persistent ileus.  Assessment/Plan:   Ileus versus partial small bowel obstruction: Patient presented with nausea vomiting and  diarrhea.  CT scan raise concern for ileus versus partial SBO. Patient was given symptomatic treatment.  NGT was not placed initially Patient initially started improving but then had recurrence of his nausea and vomiting.  CT scan was repeated which showed persistent small bowel dilatation with prominent jejunum as seen before. - Surgery also following, appreciate assistance and unclamped NGT - Minimal ambulation due to his weakness also -Trial of erythromycin  started on 11/18/2023 but not successful; began having large biliary emesis on 8/16 - NGT placed 8/16 - continue NPO, IVF for now - GI consulted but no major rec's aside from current treatment plan in place; GI Reconsulted - surgery to discuss if need for further repeat laparoscopy and they do not feel like it will be beneficial - given prolonged hospitalization with minimal to no nutrition, now warrants TPN; PICC placed 8/18 and TPN started -Minimize narcotics. C/w Antiemetics. GI feels it may take Days/weeks for this issue to resolve -Allergic to Reglan    Hypotension - Resolved w/ IVF Lactic acidosis - resolved  Leukocytosis - large GI losses on 8/16 with emesis episode, then became hypotensive and lactic elevated - s/p fluid boluses overnight and started on maintenance fluid -Lactic now normalized 8/17; low suspicion for infection; this appears hypovolemia for now - s/p IVF - continue trending CBC; holding off on abx at this time   Diarrhea: Reports semiformed stools.GI pathogen panel negative; Colonized with C. difficile but testing negative for active infection (antigen positive, toxin negative, PCR negative)  Acute urinary retention: Foley catheter was placed on 8/11.  Voiding trial once he is more stable and/or stronger   History of multiple myeloma: Followed by medical oncology, Dr. Federico.  Some of his symptoms are thought to be due to his chemotherapeutic medications.  PAF:  Eliquis  was currently on hold in case he needs  surgical intervention and remainson Heparin  gtt. Currently not on any rate limiting medications.  Home medication list reviewed.  He is on metoprolol  but only as needed. Continue to monitor on telemetry. - starting heparin  while awaiting potential surgery options and while off eliquis   Left fingers discoloration:  noticed on 8/18 but wife states was present on 8/17 some and actually was improved on 8/18; Fingers improved 8/20. Concern was for cyanosis as fingertips were also cold. -Had PICC line placed in left arm on 8/18 however wife states discoloration was present prior; there was also some extravasation from a prior IV in the left arm as well but unclear what was transfusing as happened prior to transfer to the ICU -Left arterial Doppler ordered and shows no obstruction in the left upper extremity with triphasic waveforms; Also evaluated by vascular surgery, greatly appreciate assistance and no Surgical intervention needed. Finger discoloration has resolved as of 8/19;  Okay to discontinue neurovascular checks. Continue monitoring as necessary   Normocytic Anemia:  Hgb/Hct Trend:  Recent Labs  Lab 11/16/23 0524 11/17/23 0506 11/18/23 2323 11/19/23 0642 11/20/23 0244 11/21/23 0629 11/22/23 0523  HGB 10.9* 11.9* 9.7* 10.1* 11.1* 10.4* 9.7*  HCT 33.9* 34.9* 30.0* 30.5* 33.3* 32.2* 29.9*  MCV 96.0 93.1 98.0 93.8 95.4 95.0 94.6  -CTM for S/Sx of Bleeding; No overt bleeding noted. Stable hemoglobin noted and will need repeat CBC in the AM    Hyponatremia/hypokalemia/hypomagnesemia:  Recent Labs  Lab 11/17/23 0506 11/18/23 0607 11/18/23 2323 11/19/23 0642 11/20/23 0244 11/21/23 0629 11/22/23 0523  NA 129* 134* 137 131* 135 136 133*  K 3.7 5.0 3.2* 4.2 3.9 4.1 3.6  MG 1.6* 2.0 1.4*  --  2.1 1.8 1.9  -Potassium was supplemented.  Sodium level remains intermittently low.  There could be an element of SIADH.  Continue to monitor.  Supplement magnesium .   Hypoglycemia: Probably from poor  oral intake.  Started on D5 infusion with improvement in glucose levels.  Recent Labs  Lab 11/20/23 2352 11/21/23 0534 11/21/23 1158 11/21/23 1801 11/21/23 2304 11/22/23 1207 11/22/23 1725  GLUCAP 123* 104* 103* 109* 124* 125* 112*    Chronic anxiety and depression: Continue psychotropic medications once taking po Properly   Physical deconditioning: PT and OT recommendinfg SNF  Hypoalbuminemia: Patient's Albumin  Trend:  Recent Labs  Lab 11/04/23 0721 11/09/23 1512 11/12/23 2244 11/14/23 0514 11/15/23 0527 11/16/23 0524 11/17/23 0506  ALBUMIN  2.4* 3.0* 2.6* 2.0* 2.0* 2.1* 2.1*  -Continue to Monitor and Trend and repeat CMP in the AM  Overweight: Complicates overall prognosis and care. Estimated body mass index is 25.79 kg/m as calculated from the following:   Height as of this encounter: 6' 4 (1.93 m).   Weight as of this encounter: 96.1 kg. Weight Loss and Dietary Counseling given   DVT prophylaxis: Place and maintain sequential compression device Start: 11/16/23 1142    Code Status: Full Code Family Communication: D/w with Wife  Disposition Plan:  Level of care: Stepdown Status is: Inpatient Remains inpatient appropriate because: Needs further clinical Improvement   Consultants:  Gastroenterology General Surgery  Procedures:  As delineated as above  Antimicrobials:  Anti-infectives (From admission, onward)    Start     Dose/Rate Route Frequency Ordered Stop   11/18/23 1200  erythromycin  (E-MYCIN ) tablet 250 mg  Status:  Discontinued        250 mg Oral 2 times daily with meals 11/18/23 0720 11/19/23 0734   11/13/23 1000  acyclovir  (ZOVIRAX ) tablet 400 mg  Status:  Discontinued        400 mg Oral 2 times daily 11/13/23 0503 11/19/23 0739       Subjective: Seen and examined at bedside and he is feeling okay.  Felt better after the NG tube was unclamped.  Wife thinks he is little bit more confused.  No other concerns or complaints this  time.  Objective: Vitals:   11/22/23 1420 11/22/23 1600 11/22/23 1700 11/22/23 1800  BP:   121/71 122/69  Pulse:   89 89  Resp:   15 16  Temp: 98.1 F (36.7 C) 97.8 F (36.6 C)    TempSrc: Oral Oral    SpO2:   100% 100%  Weight:      Height:        Intake/Output Summary (Last 24 hours) at 11/22/2023 1953 Last data filed at 11/22/2023 1857 Gross per 24 hour  Intake 2417.65 ml  Output 2225 ml  Net 192.65 ml   Filed Weights   11/18/23 2224 11/21/23 0540 11/22/23 0902  Weight: 94.6 kg 97.8 kg 96.1 kg   Examination: Physical Exam:  Constitutional: WN/WD overweight elderly Caucasian male in no acute distress and has NG tube to suction Respiratory: Diminished to auscultation bilaterally, no wheezing, rales, rhonchi or crackles. Normal respiratory effort and patient is not tachypenic. No accessory muscle use.  Unlabored breathing Cardiovascular: RRR, no murmurs / rubs / gallops. S1 and S2 auscultated.  Has 1+ extremity edema Abdomen: Soft, non-tender, stented secondary to body habitus. Bowel sounds positive.  GU: Deferred. Musculoskeletal: No clubbing / cyanosis of digits/nails. No joint deformity upper and lower extremities.  Skin: Finger discoloration has improved.  No appreciable rashes or lesions on the limited skin evaluation  Neurologic: CN 2-12 grossly intact with no focal deficits. Romberg sign and not cerebellar reflexes not assessed.  Psychiatric: Awake and alert   Data Reviewed: I have personally reviewed following labs and imaging studies  CBC: Recent Labs  Lab 11/18/23 2323 11/19/23 0642 11/20/23 0244 11/21/23 0629 11/22/23 0523  WBC 9.3 15.1* 14.2* 18.2* 13.8*  NEUTROABS  --   --  12.8* 16.9* 12.4*  HGB 9.7* 10.1* 11.1* 10.4* 9.7*  HCT 30.0* 30.5* 33.3* 32.2* 29.9*  MCV 98.0 93.8 95.4 95.0 94.6  PLT 234 223 261 239 209   Basic Metabolic Panel: Recent Labs  Lab 11/18/23 0607 11/18/23 2323 11/19/23 0642 11/20/23 0244 11/21/23 0629 11/22/23 0523  NA  134* 137 131* 135 136 133*  K 5.0 3.2* 4.2 3.9 4.1 3.6  CL 105 114* 103 105 107 103  CO2 23 16* 20* 23 24 25   GLUCOSE 241* 82 94 115* 127* 127*  BUN 13 15 20 23 20 17   CREATININE 0.70 1.10 1.53* 1.34* 0.76 0.49*  CALCIUM  7.5* 5.9* 7.5* 7.6* 7.9* 7.6*  MG 2.0 1.4*  --  2.1 1.8 1.9  PHOS 2.6  --   --   --  2.8 2.7   GFR: Estimated Creatinine Clearance: 102.5 mL/min (A) (by C-G formula based on SCr of 0.49 mg/dL (L)). Liver Function Tests: Recent Labs  Lab 11/16/23 0524 11/17/23 0506  AST 22 22  ALT 23 22  ALKPHOS 55 56  BILITOT 1.0 0.9  PROT 4.7* 4.8*  ALBUMIN  2.1* 2.1*   No results for input(s): LIPASE, AMYLASE in the last 168 hours. No results for input(s): AMMONIA in the last 168 hours. Coagulation Profile: No results for input(s): INR, PROTIME in the last 168 hours. Cardiac Enzymes: No results for input(s): CKTOTAL, CKMB,  CKMBINDEX, TROPONINI in the last 168 hours. BNP (last 3 results) No results for input(s): PROBNP in the last 8760 hours. HbA1C: No results for input(s): HGBA1C in the last 72 hours. CBG: Recent Labs  Lab 11/21/23 1158 11/21/23 1801 11/21/23 2304 11/22/23 1207 11/22/23 1725  GLUCAP 103* 109* 124* 125* 112*   Lipid Profile: No results for input(s): CHOL, HDL, LDLCALC, TRIG, CHOLHDL, LDLDIRECT in the last 72 hours. Thyroid  Function Tests: No results for input(s): TSH, T4TOTAL, FREET4, T3FREE, THYROIDAB in the last 72 hours. Anemia Panel: No results for input(s): VITAMINB12, FOLATE, FERRITIN, TIBC, IRON, RETICCTPCT in the last 72 hours. Sepsis Labs: Recent Labs  Lab 11/18/23 2322 11/19/23 0301 11/19/23 0857  LATICACIDVEN 2.9* 3.7* 1.4   Recent Results (from the past 240 hours)  Blood Culture (routine x 2)     Status: None   Collection Time: 11/12/23 10:42 PM   Specimen: BLOOD RIGHT FOREARM  Result Value Ref Range Status   Specimen Description   Final    BLOOD RIGHT  FOREARM Performed at [facility], [address], [place], [place] [address]    Special Requests   Final    BOTTLES DRAWN AEROBIC AND ANAEROBIC Blood Culture adequate volume Performed at [facility], [address], [place], [place] [address]    Culture   Final    NO GROWTH 5 DAYS Performed at [facility], [address], [place], [place] [address]    Report Status 11/18/2023 FINAL  Final  Blood Culture (routine x 2)     Status: None   Collection Time: 11/12/23 10:42 PM   Specimen: BLOOD  Result Value Ref Range Status   Specimen Description   Final    BLOOD BLOOD RIGHT HAND Performed at [facility], [address] [place], [place] [address]    Special Requests   Final    BOTTLES DRAWN AEROBIC AND ANAEROBIC Blood Culture adequate volume Performed at [facility], [address], [place], [place] [address]    Culture   Final    NO GROWTH 5 DAYS Performed at [facility], [address] [place], [place] [address]    Report Status 11/18/2023 FINAL  Final  Gastrointestinal Panel by PCR , Stool     Status: None   Collection Time: 11/14/23  6:42 PM   Specimen: STOOL  Result Value Ref Range Status   Campylobacter species NOT DETECTED NOT DETECTED Final   Plesimonas shigelloides NOT DETECTED NOT DETECTED Final   Salmonella species NOT DETECTED NOT DETECTED Final   Yersinia enterocolitica NOT DETECTED NOT DETECTED Final   Vibrio species NOT DETECTED NOT DETECTED Final   Vibrio cholerae NOT DETECTED NOT DETECTED Final   Enteroaggregative E coli (EAEC) NOT DETECTED NOT DETECTED Final   Enteropathogenic E coli (EPEC) NOT DETECTED NOT DETECTED Final   Enterotoxigenic E coli (ETEC) NOT DETECTED NOT DETECTED Final   Shiga like toxin producing E coli (STEC) NOT DETECTED NOT DETECTED Final   Shigella/Enteroinvasive E coli (EIEC) NOT DETECTED NOT DETECTED Final   Cryptosporidium NOT DETECTED NOT DETECTED Final    Cyclospora cayetanensis NOT DETECTED NOT DETECTED Final   Entamoeba histolytica NOT DETECTED NOT DETECTED Final   Giardia lamblia NOT DETECTED NOT DETECTED Final   Adenovirus F40/41 NOT DETECTED NOT DETECTED Final   Astrovirus NOT DETECTED NOT DETECTED Final   Norovirus GI/GII NOT DETECTED NOT DETECTED Final   Rotavirus A NOT DETECTED NOT DETECTED Final  Sapovirus (I, II, IV, and V) NOT DETECTED NOT DETECTED Final    Comment: Performed at [facility], [address] [place], [place] [address]  C Difficile Quick Screen w PCR reflex     Status: Abnormal   Collection Time: 11/14/23  6:43 PM   Specimen: STOOL  Result Value Ref Range Status   C Diff antigen POSITIVE (A) NEGATIVE Final   C Diff toxin NEGATIVE NEGATIVE Final   C Diff interpretation Results are indeterminate. See PCR results.  Final    Comment: Performed at [facility], [address] [place], [place] [address]  C. Diff by PCR, Reflexed     Status: None   Collection Time: 11/14/23  6:43 PM  Result Value Ref Range Status   Toxigenic C. Difficile by PCR NEGATIVE NEGATIVE Final    Comment: Patient is colonized with non toxigenic C. difficile. May not need treatment unless significant symptoms are present.   Hypervirulent Strain PRESUMPTIVE NEGATIVE PRESUMPTIVE NEGATIVE Final    Comment: Performed at [facility], [address] [place], [place] [address]  MRSA Next Gen by PCR, Nasal     Status: None   Collection Time: 11/19/23 12:11 AM   Specimen: Nasal Mucosa; Nasal Swab  Result Value Ref Range Status   MRSA by PCR Next Gen NOT DETECTED NOT DETECTED Final    Comment: (NOTE) The GeneXpert MRSA Assay (FDA approved for NASAL specimens only), is one component of a comprehensive MRSA colonization surveillance program. It is not intended to diagnose MRSA infection nor to guide or monitor treatment for MRSA infections. Test performance is not FDA approved in patients less than 109  years old. Performed at [facility], [address], [place], [place] [address]     Radiology Studies: VAS US  UPPER EXTREMITY ARTERIAL DUPLEX Result Date: 11/21/2023  UPPER EXTREMITY DUPLEX STUDY Patient Name:  Charls Custer Mcgrath  Date of Exam:   11/21/2023 Medical Rec #: [id number]                Accession #:    [id number] Date of Birth: 08/18/1950                Patient Gender: M Patient Age:   37 years Exam Location:  [facility] Procedure:      VAS US  UPPER EXTREMITY ARTERIAL DUPLEX Referring Phys: ALM APO --------------------------------------------------------------------------------  Indications: Cyanosis [782.5.ICD-9-CM].  Risk Factors: Hypertension, hyperlipidemia. Comparison Study: No prior studies. Performing Technologist: Cordella Collet RVT  Examination Guidelines: A complete evaluation includes B-mode imaging, spectral Doppler, color Doppler, and power Doppler as needed of all accessible portions of each vessel. Bilateral testing is considered an integral part of a complete examination. Limited examinations for reoccurring indications may be performed as noted.  Right Doppler Findings: +---------------+----------+--------+--------+--------+ Site           PSV (cm/s)WaveformStenosisComments +---------------+----------+--------+--------+--------+ Subclavian Prox70                                 +---------------+----------+--------+--------+--------+ Axillary       1                                  +---------------+----------+--------+--------+--------+ Brachial Prox  113                                +---------------+----------+--------+--------+--------+  Brachial Dist  86                                 +---------------+----------+--------+--------+--------+ Radial Prox    90                                 +---------------+----------+--------+--------+--------+ Radial Mid     76                                  +---------------+----------+--------+--------+--------+ Radial Dist    103                                +---------------+----------+--------+--------+--------+ Ulnar Prox     78                                 +---------------+----------+--------+--------+--------+ Ulnar Mid      86                                 +---------------+----------+--------+--------+--------+ Ulnar Dist     77                                 +---------------+----------+--------+--------+--------+   Left Doppler Findings: +---------------+----------+---------+--------+--------+ Site           PSV (cm/s)Waveform StenosisComments +---------------+----------+---------+--------+--------+ Subclavian Prox70        triphasic                 +---------------+----------+---------+--------+--------+ Axillary       86        triphasic                 +---------------+----------+---------+--------+--------+ Brachial Prox  113       triphasic                 +---------------+----------+---------+--------+--------+ Brachial Mid   118       triphasic                 +---------------+----------+---------+--------+--------+ Brachial Dist  86        triphasic                 +---------------+----------+---------+--------+--------+ Radial Prox    90        triphasic                 +---------------+----------+---------+--------+--------+ Radial Mid     76        triphasic                 +---------------+----------+---------+--------+--------+ Radial Dist    103       triphasic                 +---------------+----------+---------+--------+--------+ Ulnar Prox     78        triphasic                 +---------------+----------+---------+--------+--------+ Ulnar Mid      86        triphasic                 +---------------+----------+---------+--------+--------+  Ulnar Dist     77        triphasic                  +---------------+----------+---------+--------+--------+   Summary:  Left: No obstruction visualized in the left upper extremity       Triphasic waveforms are noted throughout the left arterial       system. *See table(s) above for measurements and observations. Electronically signed by Penne Colorado MD on 11/21/2023 at 6:04:17 PM.    Final    Scheduled Meds:  Chlorhexidine  Gluconate Cloth  6 each Topical Daily   fluticasone   1 spray Each Nare Daily   insulin  aspart  0-9 Units Subcutaneous Q6H   sodium chloride  flush  10-40 mL Intracatheter Q12H   thiamine  (VITAMIN B1) injection  100 mg Intravenous Daily   Continuous Infusions:  dextrose  5% lactated ringers  10 mL/hr at 11/22/23 1857   heparin  1,050 Units/hr (11/22/23 1857)   TPN ADULT (ION) 100 mL/hr at 11/22/23 1857    LOS: 9 days   Alejandro Marker, DO [facility] Available via Epic secure chat 7am-7pm After these hours, please refer to coverage provider listed on [email or web address] 11/22/2023, 7:53 PM

## 2023-11-22 NOTE — Progress Notes (Signed)
 Left upper extremity arterial duplex reviewed.  This was normal. Per primary, hand improved this morning. With normal microvasculature, I do not have an intervention to offer. Recommend continued conservative therapy.  Please call if questions or concerns arise.

## 2023-11-22 NOTE — Progress Notes (Signed)
 PHARMACY - TOTAL PARENTERAL NUTRITION CONSULT NOTE   Indication: Bowel Obstruction  Patient Measurements: Height: 6' 4 (193 cm) Weight: 96.1 kg (211 lb 13.8 oz) IBW/kg (Calculated) : 86.8 TPN AdjBW (KG): 94.6 Body mass index is 25.79 kg/m. Usual Weight:   Assessment:  Pharmacy is consulted to start TPN on 73 yo male diagnosed with bowel obstruction. This admission CT abdomen shows increasing small bowel dilatation and fecalization of bowel contents involving the jejunum. No definitive transition point is identified at this time but caliber change is noted in the right mid abdomen at the junction of the jejunum and ileum. The more distal ileum is unremarkable. Some suspicion that bowel obstruction may be related to bortezomib  treatment pt receiving for multiple myeloma.   Glucose / Insulin :  - no hx of DM - CBG 103-124   - 0 units of insulin  given  Electrolytes:  - Na low at 133, K+ trended down to 3.6, magnesium  1.9, phosphorus on lower end at 2.7 - Other electrolytes are WNL including CorrCa at 9.1 Renal:  - Scr now improved to <1  Hepatic:  - Total I/0 417 mL, 700 mL though NG tube, did not tolerate tube clamp  - LFTs, alk phos, bilirubin WNL  Intake / Output; MIVF:  - D5LR at [facility]    GI Imaging: -  Admitted from 10/14/23-10/20/23 for symptoms related to small bowel obstruction (transition point suspected in the right hemi-abdomen on CT imaging) and received non-operative management.  - Admitted 11/02/23 - 11/05/2023 for small bowel dilatation/concern for small bowel obstruction. CT imaging showed persistent small bowel dilatation with mild transition point noted deep in pelvis. He went to OR 11/03/23, findings were suspicious for intermittent volvulus of small bowel to explain his symptoms and CT imaging.  - 8/10  CT abdomen shows increasing small bowel dilatation and fecalization of bowel contents involving the jejunum. No definitive transition point is identified at this time but  caliber change is noted in the right mid abdomen at the junction of the jejunum and ileum.  GI Surgeries / Procedures:  8/1: to OR 11/03/23, findings were suspicious for intermittent volvulus of small bowel to explain his symptoms and CT imaging. Central access:   8/18  Nutritional Goals: Goal TPN rate is 100  mL/hr (provides 132  g of protein and 2438 kcals per day)  RD Assessment: from 8/18  Estimated Needs Total Energy Estimated Needs: 2400-2600 kcals Total Protein Estimated Needs: 120-135 grams Total Fluid Estimated Needs: >/= 2.4L  Current Nutrition:  NPO  Plan:  Now - Potassium phosphate  30 mmol x 1  - Magnesium  sulfate 2 gr IV x1   Increase TPN to 100 mL/hr at 1800, target rate  Electrolytes in TPN:  Inc Na  to 70 mEq/L Inc K  to 6 0mEq/L  Ca 26mEq/L Mg inc to 10 mEq/L Inc to Phos 20 mmol/L.  Cl:Ac 1:1 Add standard MVI and trace elements to TPN Initiate Sensitive q6h SSI and adjust as needed  Thiamine  100 mg IV daily x 5 days through 8/22  MIVF at  [facility] mL/hr   Monitor TPN labs on Mon/Thurs   Dolphus Roller, PharmD, BCPS 11/22/2023 10:01 AM

## 2023-11-22 NOTE — Progress Notes (Signed)
 Subjective: Nauseated, NGT in place  Objective: Vital signs in last 24 hours: Temp:  [97.6 F (36.4 C)-98.1 F (36.7 C)] 98.1 F (36.7 C) (08/20 1206) Pulse Rate:  [64-89] 64 (08/20 1206) Resp:  [12-31] 19 (08/20 1206) BP: (103-149)/(44-115) 128/63 (08/20 1200) SpO2:  [89 %-100 %] 100 % (08/20 1206) Weight:  [96.1 kg] 96.1 kg (08/20 0902) Weight change:  Last BM Date : 11/21/23  PE: GEN:  NAD HEENT:  NGT in place ABD:  Soft, mild distention, non-tender NEURO:  No encephalopahty  Lab Results: CBC    Component Value Date/Time   WBC 13.8 (H) 11/22/2023 0523   RBC 3.16 (L) 11/22/2023 0523   HGB 9.7 (L) 11/22/2023 0523   HGB 11.7 (L) 11/09/2023 1512   HGB 10.9 (L) 08/02/2023 0942   HCT 29.9 (L) 11/22/2023 0523   HCT 32.0 (L) 08/02/2023 0942   PLT 209 11/22/2023 0523   PLT 290 11/09/2023 1512   PLT 343 08/02/2023 0942   MCV 94.6 11/22/2023 0523   MCV 96 08/02/2023 0942   MCH 30.7 11/22/2023 0523   MCHC 32.4 11/22/2023 0523   RDW 17.3 (H) 11/22/2023 0523   RDW 15.1 08/02/2023 0942   LYMPHSABS 0.5 (L) 11/22/2023 0523   MONOABS 0.6 11/22/2023 0523   EOSABS 0.1 11/22/2023 0523   BASOSABS 0.0 11/22/2023 0523  CMP     Component Value Date/Time   NA 133 (L) 11/22/2023 0523   NA 141 08/02/2023 0942   K 3.6 11/22/2023 0523   CL 103 11/22/2023 0523   CO2 25 11/22/2023 0523   GLUCOSE 127 (H) 11/22/2023 0523   BUN 17 11/22/2023 0523   BUN 7 (L) 08/02/2023 0942   CREATININE 0.49 (L) 11/22/2023 0523   CREATININE 0.72 11/09/2023 1512   CALCIUM  7.6 (L) 11/22/2023 0523   PROT 4.8 (L) 11/17/2023 0506   PROT 7.0 01/09/2020 1445   ALBUMIN  2.1 (L) 11/17/2023 0506   AST 22 11/17/2023 0506   AST 36 11/09/2023 1512   ALT 22 11/17/2023 0506   ALT 32 11/09/2023 1512   ALKPHOS 56 11/17/2023 0506   BILITOT 0.9 11/17/2023 0506   BILITOT 0.5 11/09/2023 1512   EGFR 97 08/02/2023 0942   GFRNONAA >60 11/22/2023 0523   GFRNONAA >60 11/09/2023 1512    Assessment:  Recurrent  nausea/vomiting.   Concern for small bowel obstruction prior imaging, but exploratory lap unrevealing. Multiple myeloma on bortezomib .  Plan:   Supportive care with IVF, antiemetics, promotility agents, NGT. Minimize narcotics. OOBTC/ambulate as/if tolerated. Watch electrolyte levels, especially potassium. If this is indeed small bowel/GI tract dysmotility process, there is no easy fix and it may take days/weeks for issues to resolve. Unfortunately nothing else to offer from GI perspective. [facility] will follow along at a distance.   David Mcgrath 11/22/2023, 1:29 PM   Cell [phone] If no answer or after 5 PM call [phone]

## 2023-11-22 NOTE — Progress Notes (Signed)
 Subjective/Chief Complaint: Developed nausea and projective vomiting.  NGT returned to suction and over 1L removed.  Now with a rectal pouch and diarrhea.  He is sleeping currently. Wife at bedside provides new information today that over the last year the patient has developed early satiety and fullness with a decrease in his overall appetite.   Objective: Vital signs in last 24 hours: Temp:  [97.6 F (36.4 C)-97.9 F (36.6 C)] 97.6 F (36.4 C) (08/20 0800) Pulse Rate:  [73-92] 87 (08/20 0800) Resp:  [15-31] 23 (08/20 0800) BP: (103-180)/(44-115) 132/58 (08/20 0800) SpO2:  [89 %-100 %] 100 % (08/20 0800) Last BM Date : 11/21/23  Intake/Output from previous day: 08/19 0701 - 08/20 0700 In: 1842.8 [I.V.:1692.8; NG/GT:100; IV Piggyback:50] Out: 1425 [Urine:725; Emesis/NG output:700] Intake/Output this shift: No intake/output data recorded.  Gen: sleeping, NAD Ab soft, ND, nontender, incisions clean, NGT with copious bilious output   Lab Results:  Recent Labs    11/21/23 0629 11/22/23 0523  WBC 18.2* 13.8*  HGB 10.4* 9.7*  HCT 32.2* 29.9*  PLT 239 209   BMET Recent Labs    11/21/23 0629 11/22/23 0523  NA 136 133*  K 4.1 3.6  CL 107 103  CO2 24 25  GLUCOSE 127* 127*  BUN 20 17  CREATININE 0.76 0.49*  CALCIUM  7.9* 7.6*   PT/INR No results for input(s): LABPROT, INR in the last 72 hours. ABG No results for input(s): PHART, HCO3 in the last 72 hours.  Invalid input(s): PCO2, PO2  Studies/Results: VAS US  UPPER EXTREMITY ARTERIAL DUPLEX Result Date: 11/21/2023  UPPER EXTREMITY DUPLEX STUDY Patient Name:  Raad Clayson III  Date of Exam:   11/21/2023 Medical Rec #: [id number]                Accession #:    [id number] Date of Birth: [birth year]                Patient Gender: M Patient Age:   73 years Exam Location:  [facility] Procedure:      VAS US  UPPER EXTREMITY ARTERIAL DUPLEX Referring Phys: ALM APO  --------------------------------------------------------------------------------  Indications: Cyanosis [782.5.ICD-9-CM].  Risk Factors: Hypertension, hyperlipidemia. Comparison Study: No prior studies. Performing Technologist: Cordella Collet RVT  Examination Guidelines: A complete evaluation includes B-mode imaging, spectral Doppler, color Doppler, and power Doppler as needed of all accessible portions of each vessel. Bilateral testing is considered an integral part of a complete examination. Limited examinations for reoccurring indications may be performed as noted.  Right Doppler Findings: +---------------+----------+--------+--------+--------+ Site           PSV (cm/s)WaveformStenosisComments +---------------+----------+--------+--------+--------+ Subclavian Prox70                                 +---------------+----------+--------+--------+--------+ Axillary       1                                  +---------------+----------+--------+--------+--------+ Brachial Prox  113                                +---------------+----------+--------+--------+--------+ Brachial Dist  86                                 +---------------+----------+--------+--------+--------+  Radial Prox    90                                 +---------------+----------+--------+--------+--------+ Radial Mid     76                                 +---------------+----------+--------+--------+--------+ Radial Dist    103                                +---------------+----------+--------+--------+--------+ Ulnar Prox     78                                 +---------------+----------+--------+--------+--------+ Ulnar Mid      86                                 +---------------+----------+--------+--------+--------+ Ulnar Dist     77                                 +---------------+----------+--------+--------+--------+   Left Doppler Findings:  +---------------+----------+---------+--------+--------+ Site           PSV (cm/s)Waveform StenosisComments +---------------+----------+---------+--------+--------+ Subclavian Prox70        triphasic                 +---------------+----------+---------+--------+--------+ Axillary       86        triphasic                 +---------------+----------+---------+--------+--------+ Brachial Prox  113       triphasic                 +---------------+----------+---------+--------+--------+ Brachial Mid   118       triphasic                 +---------------+----------+---------+--------+--------+ Brachial Dist  86        triphasic                 +---------------+----------+---------+--------+--------+ Radial Prox    90        triphasic                 +---------------+----------+---------+--------+--------+ Radial Mid     76        triphasic                 +---------------+----------+---------+--------+--------+ Radial Dist    103       triphasic                 +---------------+----------+---------+--------+--------+ Ulnar Prox     78        triphasic                 +---------------+----------+---------+--------+--------+ Ulnar Mid      86        triphasic                 +---------------+----------+---------+--------+--------+ Ulnar Dist     77        triphasic                 +---------------+----------+---------+--------+--------+   Summary:  Left: No obstruction visualized in the left upper extremity       Triphasic waveforms are noted throughout the left arterial       system. *See table(s) above for measurements and observations. Electronically signed by Penne Colorado MD on 11/21/2023 at 6:04:17 PM.    Final    US  EKG SITE RITE Result Date: 11/20/2023 If Site Rite image not attached, placement could not be confirmed due to current cardiac rhythm.   Anti-infectives: Anti-infectives (From admission, onward)    Start     Dose/Rate Route  Frequency Ordered Stop   11/18/23 1200  erythromycin  (E-MYCIN ) tablet 250 mg  Status:  Discontinued        250 mg Oral 2 times daily with meals 11/18/23 0720 11/19/23 0734   11/13/23 1000  acyclovir  (ZOVIRAX ) tablet 400 mg  Status:  Discontinued        400 mg Oral 2 times daily 11/13/23 0503 11/19/23 0739       Assessment/Plan: POD 19 dx lsc for possible sbo-negative, ? ileus -GI weighed in that cancer med could cause ileus type response.  This has been DC. -they did not recommend any specific medication treatment for his possible dysmotility at this time -wife provided new info today that would corroborate dysmotility in that the patient has been having worsening early satiety, fullness, and decrease in appetite over the last year.  His CT scan shows a very large stomach.  He is currently having diarrhea and large NGT output.  He is not mechanically obstructed and has had a dx lap with no findings for obstruction.  All of this is mor suggestive of gastroparesis and SB dysmotility type picture -re-engage GI as not sure there is much else left to offer surgically at this time.  -no surgical role at this time -WBC 13K, cr normalized -ngt back to LIWS due to failure to tolerate NGT clamping -cont TNA for nutritional support -have reached out to primary service as well as GI service.  FEN - NPO/NGT/IVFs/TNA VTE - Lovenox  ID - none  MM PCM - TNA AKI HTN HLD A fib  11/22/2023

## 2023-11-22 NOTE — Progress Notes (Signed)
 PHARMACY - ANTICOAGULATION CONSULT NOTE  Pharmacy Consult for heparin  Indication: atrial fibrillation  Allergies  Allergen Reactions   Shrimp [Shellfish Allergy] Anaphylaxis and Swelling    Swelling throat    Reglan  [Metoclopramide ] Other (See Comments)    caused tardive dyskinesia    Patient Measurements: Height: 6' 4 (193 cm) Weight: 96.1 kg (211 lb 13.8 oz) IBW/kg (Calculated) : 86.8 HEPARIN  DW (KG): 94.6  Vital Signs: Temp: 97.6 F (36.4 C) (08/20 0800) Temp Source: Oral (08/20 0800) BP: 132/58 (08/20 0800) Pulse Rate: 87 (08/20 0800)  Labs: Recent Labs    11/20/23 0244 11/20/23 1710 11/21/23 0629 11/21/23 1804 11/22/23 0523  HGB 11.1*  --  10.4*  --  9.7*  HCT 33.3*  --  32.2*  --  29.9*  PLT 261  --  239  --  209  APTT  --   --  71*  --   --   HEPARINUNFRC  --    < > 0.86* 0.46 0.36  CREATININE 1.34*  --  0.76  --  0.49*   < > = values in this interval not displayed.    Estimated Creatinine Clearance: 102.5 mL/min (A) (by C-G formula based on SCr of 0.49 mg/dL (L)).   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  Patient reports he does not have symptoms as of 02/25/14.     Diarrhea 06/04/2023   Essential hypertension 06/04/2023   Fatty liver 06/04/2023   Flu 06/04/2023   Hyperlipidemia 06/04/2023   Ileus (HCC) 06/04/2023   Multiple myeloma (HCC)    Neuropathy    Paroxysmal atrial fibrillation (HCC) 07/04/2023   Pneumonia 06/04/2023     Assessment: 73 year old male with afib on Eliquis  PTA which is currently on hold. Patient recently admitted for bowel obstruction and discharged home after conservative treatment with NGT decompression. He presented back with similar concerns, currently being treated for ileus vs pSBO,  plan to start TPN given prolonged hospitalization without nutrition. His Eliquis  was initially resumed on admission and discontinued on 8/14. Has been on prophylactic Lovenox . Pharmacy consulted for heparin  initiation and management.  Today, 11/22/23 Hgb 9/7, plt 209 Scr is 0.49 mg/dl, improved after elevation last few days aPTT 71 HL is 0.36, therapeutic  on heparin  1050 unit/hr    Goal of Therapy:  Heparin  level 0.3-0.7 units/ml Monitor platelets by anticoagulation protocol: Yes   Plan:  - Continue heparin  infusion at 1050 units/hr -Daily CBC, HL -Continue to follow for transition back to Eliquis  as appropriate   Dolphus Roller, PharmD, BCPS 11/22/2023 9:22 AM

## 2023-11-23 ENCOUNTER — Inpatient Hospital Stay (HOSPITAL_COMMUNITY)

## 2023-11-23 ENCOUNTER — Inpatient Hospital Stay

## 2023-11-23 ENCOUNTER — Ambulatory Visit

## 2023-11-23 ENCOUNTER — Inpatient Hospital Stay: Admitting: Nutrition

## 2023-11-23 ENCOUNTER — Other Ambulatory Visit

## 2023-11-23 ENCOUNTER — Inpatient Hospital Stay: Admitting: Hematology and Oncology

## 2023-11-23 ENCOUNTER — Ambulatory Visit: Admitting: Hematology and Oncology

## 2023-11-23 ENCOUNTER — Other Ambulatory Visit: Payer: Self-pay

## 2023-11-23 DIAGNOSIS — K5669 Other partial intestinal obstruction: Secondary | ICD-10-CM | POA: Diagnosis not present

## 2023-11-23 DIAGNOSIS — K567 Ileus, unspecified: Secondary | ICD-10-CM | POA: Diagnosis not present

## 2023-11-23 DIAGNOSIS — I480 Paroxysmal atrial fibrillation: Secondary | ICD-10-CM | POA: Diagnosis not present

## 2023-11-23 DIAGNOSIS — E876 Hypokalemia: Secondary | ICD-10-CM | POA: Diagnosis not present

## 2023-11-23 DIAGNOSIS — R140 Abdominal distension (gaseous): Secondary | ICD-10-CM | POA: Diagnosis not present

## 2023-11-23 DIAGNOSIS — C9000 Multiple myeloma not having achieved remission: Secondary | ICD-10-CM | POA: Diagnosis not present

## 2023-11-23 DIAGNOSIS — Z4682 Encounter for fitting and adjustment of non-vascular catheter: Secondary | ICD-10-CM | POA: Diagnosis not present

## 2023-11-23 LAB — COMPREHENSIVE METABOLIC PANEL WITH GFR
ALT: 19 U/L (ref 0–44)
AST: 19 U/L (ref 15–41)
Albumin: 1.7 g/dL — ABNORMAL LOW (ref 3.5–5.0)
Alkaline Phosphatase: 56 U/L (ref 38–126)
Anion gap: 6 (ref 5–15)
BUN: 15 mg/dL (ref 8–23)
CO2: 27 mmol/L (ref 22–32)
Calcium: 7.6 mg/dL — ABNORMAL LOW (ref 8.9–10.3)
Chloride: 103 mmol/L (ref 98–111)
Creatinine, Ser: 0.47 mg/dL — ABNORMAL LOW (ref 0.61–1.24)
GFR, Estimated: >60 mL/min (ref >60)
Glucose, Bld: 120 mg/dL — ABNORMAL HIGH (ref 70–99)
Potassium: 4.3 mmol/L (ref 3.5–5.1)
Sodium: 136 mmol/L (ref 135–145)
Total Bilirubin: 0.2 mg/dL (ref 0.0–1.2)
Total Protein: 4.4 g/dL — ABNORMAL LOW (ref 6.5–8.1)

## 2023-11-23 LAB — CBC WITH DIFFERENTIAL/PLATELET
Abs Immature Granulocytes: 0.09 K/uL — ABNORMAL HIGH (ref 0.00–0.07)
Basophils Absolute: 0 K/uL (ref 0.0–0.1)
Basophils Relative: 0 %
Eosinophils Absolute: 0.1 K/uL (ref 0.0–0.5)
Eosinophils Relative: 1 %
HCT: 27.6 % — ABNORMAL LOW (ref 39.0–52.0)
Hemoglobin: 8.8 g/dL — ABNORMAL LOW (ref 13.0–17.0)
Immature Granulocytes: 1 %
Lymphocytes Relative: 7 %
Lymphs Abs: 0.6 K/uL — ABNORMAL LOW (ref 0.7–4.0)
MCH: 30.3 pg (ref 26.0–34.0)
MCHC: 31.9 g/dL (ref 30.0–36.0)
MCV: 95.2 fL (ref 80.0–100.0)
Monocytes Absolute: 0.7 K/uL (ref 0.1–1.0)
Monocytes Relative: 8 %
Neutro Abs: 7.3 K/uL (ref 1.7–7.7)
Neutrophils Relative %: 83 %
Platelets: 183 K/uL (ref 150–400)
RBC: 2.9 MIL/uL — ABNORMAL LOW (ref 4.22–5.81)
RDW: 17.5 % — ABNORMAL HIGH (ref 11.5–15.5)
WBC: 8.7 K/uL (ref 4.0–10.5)
nRBC: 0 % (ref 0.0–0.2)

## 2023-11-23 LAB — GLUCOSE, CAPILLARY
Glucose-Capillary: 106 mg/dL — ABNORMAL HIGH (ref 70–99)
Glucose-Capillary: 113 mg/dL — ABNORMAL HIGH (ref 70–99)
Glucose-Capillary: 116 mg/dL — ABNORMAL HIGH (ref 70–99)
Glucose-Capillary: 117 mg/dL — ABNORMAL HIGH (ref 70–99)
Glucose-Capillary: 120 mg/dL — ABNORMAL HIGH (ref 70–99)

## 2023-11-23 LAB — HEPARIN LEVEL (UNFRACTIONATED)
Heparin Unfractionated: 0.12 [IU]/mL — ABNORMAL LOW (ref 0.30–0.70)
Heparin Unfractionated: <0.1 [IU]/mL — ABNORMAL LOW (ref 0.30–0.70)
Heparin Unfractionated: <0.1 [IU]/mL — ABNORMAL LOW (ref 0.30–0.70)

## 2023-11-23 LAB — PHOSPHORUS: Phosphorus: 3.3 mg/dL (ref 2.5–4.6)

## 2023-11-23 LAB — MAGNESIUM: Magnesium: 2 mg/dL (ref 1.7–2.4)

## 2023-11-23 MED ORDER — LIDOCAINE VISCOUS HCL 2 % MT SOLN
15.0000 mL | Freq: Once | OROMUCOSAL | Status: AC
Start: 2023-11-23 — End: 2023-11-23
  Administered 2023-11-23: 15 mL via OROMUCOSAL
  Filled 2023-11-23: qty 15

## 2023-11-23 MED ORDER — INSULIN ASPART 100 UNIT/ML IJ SOLN
0.0000 [IU] | Freq: Three times a day (TID) | INTRAMUSCULAR | Status: DC
  Administered 2023-11-25 – 2023-11-29 (×11): 1 [IU] via SUBCUTANEOUS

## 2023-11-23 MED ORDER — ERYTHROMYCIN BASE 250 MG PO TABS
250.0000 mg | ORAL_TABLET | Freq: Three times a day (TID) | ORAL | Status: DC
  Filled 2023-11-23 (×2): qty 1

## 2023-11-23 MED ORDER — HEPARIN BOLUS VIA INFUSION
2500.0000 [IU] | Freq: Once | INTRAVENOUS | Status: AC
  Administered 2023-11-23: 2500 [IU] via INTRAVENOUS
  Filled 2023-11-23: qty 2500

## 2023-11-23 MED ORDER — SODIUM CHLORIDE 0.9 % IV SOLN
250.0000 mg | Freq: Three times a day (TID) | INTRAVENOUS | Status: DC
  Administered 2023-11-23 – 2023-11-24 (×2): 250 mg via INTRAVENOUS
  Filled 2023-11-23 (×3): qty 5

## 2023-11-23 MED ORDER — BUTAMBEN-TETRACAINE-BENZOCAINE 2-2-14 % EX AERO
1.0000 | INHALATION_SPRAY | Freq: Once | CUTANEOUS | Status: DC
  Filled 2023-11-23: qty 20

## 2023-11-23 MED ORDER — HEPARIN BOLUS VIA INFUSION
2800.0000 [IU] | Freq: Once | INTRAVENOUS | Status: AC
  Administered 2023-11-23: 2800 [IU] via INTRAVENOUS
  Filled 2023-11-23: qty 2800

## 2023-11-23 MED ORDER — TRAVASOL 10 % IV SOLN
INTRAVENOUS | Status: AC
  Filled 2023-11-23: qty 1320

## 2023-11-23 MED ORDER — LIDOCAINE VISCOUS HCL 2 % MT SOLN
OROMUCOSAL | Status: AC
Start: 2023-11-23 — End: 2023-11-23
  Filled 2023-11-23: qty 15

## 2023-11-23 MED ORDER — BUTAMBEN-TETRACAINE-BENZOCAINE 2-2-14 % EX AERO
INHALATION_SPRAY | CUTANEOUS | Status: AC
  Filled 2023-11-23: qty 20

## 2023-11-23 MED ORDER — LORAZEPAM 2 MG/ML IJ SOLN
1.0000 mg | Freq: Once | INTRAMUSCULAR | Status: AC | PRN
  Administered 2023-11-23: 1 mg via INTRAVENOUS
  Filled 2023-11-23: qty 1

## 2023-11-23 NOTE — Progress Notes (Signed)
 This nurse and charge nurse attempted to place NGT twice without success (continues to coil in patient's mouth). Informed provider and asked if we should attempt to place again with more sedation/med for anxiety or send pt to IR.

## 2023-11-23 NOTE — Progress Notes (Signed)
 This nurse found NGT out of place this AM on assessment, attempted 3 times to place new NGT but tubing continues to coil; notified provider and charge nurse

## 2023-11-23 NOTE — Progress Notes (Signed)
 PROGRESS NOTE    David Mcgrath  FMW:[id number] DOB: [birth year] DOA: 11/12/2023 PCP: [name], DO   Brief Narrative:  73 y.o. male with medical history significant for hypertension, hyperlipidemia, paroxysmal A-fib on Eliquis , chronic anxiety/depression, polyneuropathy, multiple myeloma (diagnosed March 2025) received Bortezomib  infusion 11/09/2023. Patient was admitted here 11/02/2023 through 11/05/2023 with intractable nausea vomiting, recurrent small bowel obstruction.  He was treated conservatively with NG tube decompression and discharged home.  Last admission CT abdomen/pelvis with contrast with slight improvement in small bowel dilation when compared to prior exam but with mild transition point deep in the pelvis, no pancreatic ductal dilation, no inflammatory changes, stable free fluid within the abdomen.  General surgery was consulted and patient underwent diagnostic laparoscopy by Dr. [name] on 11/03/2023 findings of intermittently dilated small bowel with erythema of the serosa consistent with inflammation/edema, intermittent injection of the small bowel mesentery, small bowel with tendency to migrate to the left upper quadrant with long/mobile small bowel mesentery with suspicion/potential for intermittent volvulized small bowel contributing to his symptoms and findings on imaging.  Diet was slowly advanced with toleration.    This admission CT abdomen shows increasing small bowel dilatation and fecalization of bowel contents involving the jejunum. No definitive transition point is identified at this time but caliber change is noted in the right mid abdomen at the junction of the jejunum and ileum. The more distal ileum is unremarkable. Stable ground-glass opacities in the lung bases similar to that seen on prior exams.   **Interim History Has had a persistent ileus.  Unfortunately lost his NG tube but had to be replaced by IR.  Now connected back to LIWS.  Undergoing a upper GI  series with small bowel follow-through.  Assessment/Plan:   Ileus versus partial small bowel obstruction: Patient presented with nausea vomiting and diarrhea.  CT scan raise concern for ileus versus partial SBO. Patient was given symptomatic treatment.  NGT was not placed initially Patient initially started improving but then had recurrence of his nausea and vomiting.  CT scan was repeated which showed persistent small bowel dilatation with prominent jejunum as seen before. - Surgery also following, appreciate assistance and unclamped NGT - Minimal ambulation due to his weakness also -Trial of erythromycin  started on 11/18/2023 but not successful; began having large biliary emesis on 8/16 - NGT placed 8/16 - continue NPO, IVF for now - GI consulted but no major rec's aside from current treatment plan in place; GI Re-consulted and recommending Supportive Care -Surgery to discuss if need for further repeat laparoscopy and they do not feel like it will be beneficial -Given prolonged hospitalization with minimal to no nutrition, now warrants TPN; PICC placed 8/18 and TPN started; NGT in place and connected to LIWS but lost this AM and had to be replaced by IR and hooked back to Suction -Will be getting an UGI w/ SBFT per Surgery to assess for transit and patency -KUB this AM showed Similar pattern of gas within the stomach, small bowel and right colon. Pattern is most consistent with either ileus or pseudo-obstruction. -Minimize narcotics. C/w Antiemetics. GI feels it may take Days/weeks for this issue to resolve -Allergic to Reglan  so will resume Erythromycin  again -Repeat KUB in the a.m.  Hypotension - Resolved w/ IVF Lactic acidosis - resolved  Leukocytosis - large GI losses on 8/16 with emesis episode, then became hypotensive and lactic elevated - s/p fluid boluses overnight and started on maintenance fluid -Lactic now normalized 8/17; low suspicion for  infection; this appears hypovolemia  for now - s/p IVF - continue trending CBC; holding off on abx at this time   Diarrhea: Reports semiformed stools.GI pathogen panel negative; Colonized with C. difficile but testing negative for active infection (antigen positive, toxin negative, PCR negative)  Acute urinary retention: Foley catheter was placed on 8/11.  Voiding trial once he is more stable and/or stronger   History of multiple myeloma: Followed by medical oncology, Dr. Federico.  Some of his symptoms are thought to be due to his chemotherapeutic medications.  PAF:  Eliquis  was currently on hold in case he needs surgical intervention and remainson Heparin  gtt. Currently not on any rate limiting medications.  Home medication list reviewed.  He is on metoprolol  but only as needed. Continue to monitor on telemetry. -Starting heparin  while awaiting potential surgery options and while off eliquis ; Continue for now given NPO  Left fingers discoloration:  Resolved and Improved. Noticed on 8/18 but wife states was present on 8/17 some and actually was improved on 8/18; Fingers improved 8/20. Concern was for cyanosis as fingertips were also cold. -Had PICC line placed in left arm on 8/18 however wife states discoloration was present prior; there was also some extravasation from a prior IV in the left arm as well but unclear what was transfusing as happened prior to transfer to the ICU -Left arterial Doppler ordered and shows no obstruction in the left upper extremity with triphasic waveforms; Also evaluated by vascular surgery, greatly appreciate assistance and no Surgical intervention needed. Finger discoloration has resolved as of 8/19;  Okay to discontinue neurovascular checks. Continue monitoring as necessary   Normocytic Anemia:  Hgb/Hct Trend:  Recent Labs  Lab 11/17/23 0506 11/18/23 2323 11/19/23 9357 11/20/23 0244 11/21/23 0629 11/22/23 0523 11/23/23 0407  HGB 11.9* 9.7* 10.1* 11.1* 10.4* 9.7* 8.8*  HCT 34.9* 30.0* 30.5* 33.3*  32.2* 29.9* 27.6*  MCV 93.1 98.0 93.8 95.4 95.0 94.6 95.2  -CTM for S/Sx of Bleeding; No overt bleeding noted. Stable hemoglobin noted and will need repeat CBC in the AM    Hyponatremia/hypokalemia/hypomagnesemia:  Recent Labs  Lab 11/18/23 0607 11/18/23 2323 11/19/23 0642 11/20/23 0244 11/21/23 0629 11/22/23 0523 11/23/23 0407  NA 134* 137 131* 135 136 133* 136  K 5.0 3.2* 4.2 3.9 4.1 3.6 4.3  MG 2.0 1.4*  --  2.1 1.8 1.9 2.0  -Potassium was supplemented.  Sodium level remains intermittently low.  There could be an element of SIADH.  Continue to monitor.  Supplement magnesium .   Hypoglycemia: Probably from poor oral intake.  Started on D5 infusion with improvement in glucose levels.  Recent Labs  Lab 11/21/23 2304 11/22/23 1207 11/22/23 1725 11/23/23 0042 11/23/23 0536 11/23/23 1233 11/23/23 1701  GLUCAP 124* 125* 112* 113* 117* 120* 116*    Chronic anxiety and depression: Continue psychotropic medications once taking po Properly   Physical deconditioning: PT and OT recommendinfg SNF when medically stable for D/C  Hypoalbuminemia: Patient's Albumin  Trending down and went from 3.0 -> 2.6 -> 2.0 -> 2.1 -> 1.7. CTM and Trend and repeat CMP in the AM:   Overweight: Complicates overall prognosis and care. Estimated body mass index is 25.79 kg/m as calculated from the following:   Height as of this encounter: 6' 4 (1.93 m).   Weight as of this encounter: 96.1 kg. Weight Loss and Dietary Counseling given   DVT prophylaxis: Place and maintain sequential compression device Start: 11/16/23 1142    Code Status: Full Code Family Communication:  Discussed with wife at bedside  Disposition Plan:  Level of care: Stepdown Status is: Inpatient Remains inpatient appropriate because: Needs further clinical improvement and clearance by the specialists   Consultants:  Gastroenterology Interventional radiology Vascular surgery General Surgery  Procedures:  As delineated as  above  Antimicrobials:  Anti-infectives (From admission, onward)    Start     Dose/Rate Route Frequency Ordered Stop   11/23/23 2000  erythromycin  250 mg in sodium chloride  0.9 % 100 mL IVPB        250 mg 100 mL/hr over 60 Minutes Intravenous Every 8 hours 11/23/23 1744     11/23/23 1200  erythromycin  (E-MYCIN ) tablet 250 mg  Status:  Discontinued        250 mg Oral 3 times daily before meals 11/23/23 1004 11/23/23 1744   11/18/23 1200  erythromycin  (E-MYCIN ) tablet 250 mg  Status:  Discontinued        250 mg Oral 2 times daily with meals 11/18/23 0720 11/19/23 0734   11/13/23 1000  acyclovir  (ZOVIRAX ) tablet 400 mg  Status:  Discontinued        400 mg Oral 2 times daily 11/13/23 0503 11/19/23 0739       Subjective: Seen and examined at bedside and he was feeling uncomfortable.  Lost his NG tube this morning.  Wife is frustrated and asking to speak with GI.  No other concerns or complaints at this time.  See reason for  Objective: Vitals:   11/23/23 1500 11/23/23 1600 11/23/23 1650 11/23/23 1700  BP: (!) 145/81 112/67  133/63  Pulse: (!) 50 (!) 47  (!) 52  Resp: (!) 25 17  (!) 28  Temp:   98.1 F (36.7 C)   TempSrc:   Axillary   SpO2: 100% 100%  98%  Weight:      Height:        Intake/Output Summary (Last 24 hours) at 11/23/2023 1909 Last data filed at 11/23/2023 1833 Gross per 24 hour  Intake 2865.54 ml  Output 1925 ml  Net 940.54 ml   Filed Weights   11/18/23 2224 11/21/23 0540 11/22/23 0902  Weight: 94.6 kg 97.8 kg 96.1 kg   Examination: Physical Exam:  Constitutional: WN/WD, overweight elderly Caucasian male who appears little uncomfortable Respiratory: Diminished to auscultation bilaterally, no wheezing, rales, rhonchi or crackles. Normal respiratory effort and patient is not tachypenic. No accessory muscle use.  Unlabored breathing Cardiovascular: RRR, no murmurs / rubs / gallops. S1 and S2 auscultated.  Has 1+ lower extremity edema worse on the right compared  to the left Abdomen: Soft, non-tender, distended secondary to body habitus bowel sounds positive.  GU: Deferred. Musculoskeletal: No clubbing / cyanosis of digits/nails. No joint deformity upper and lower extremities. Good ROM, no contractures. Normal strength and muscle tone.  Skin: No rashes, lesions, ulcers. No induration; Warm and dry.  Neurologic: CN 2-12 grossly intact with no focal deficits. Romberg sign cerebellar reflexes not assessed.  Psychiatric: Normal judgment and insight. Alert and oriented x 3. Anxious   Data Reviewed: I have personally reviewed following labs and imaging studies  CBC: Recent Labs  Lab 11/19/23 0642 11/20/23 0244 11/21/23 0629 11/22/23 0523 11/23/23 0407  WBC 15.1* 14.2* 18.2* 13.8* 8.7  NEUTROABS  --  12.8* 16.9* 12.4* 7.3  HGB 10.1* 11.1* 10.4* 9.7* 8.8*  HCT 30.5* 33.3* 32.2* 29.9* 27.6*  MCV 93.8 95.4 95.0 94.6 95.2  PLT 223 261 239 209 183   Basic Metabolic Panel: Recent Labs  Lab  11/18/23 9392 11/18/23 2323 11/19/23 9357 11/20/23 0244 11/21/23 0629 11/22/23 0523 11/23/23 0407  NA 134* 137 131* 135 136 133* 136  K 5.0 3.2* 4.2 3.9 4.1 3.6 4.3  CL 105 114* 103 105 107 103 103  CO2 23 16* 20* 23 24 25 27   GLUCOSE 241* 82 94 115* 127* 127* 120*  BUN 13 15 20 23 20 17 15   CREATININE 0.70 1.10 1.53* 1.34* 0.76 0.49* 0.47*  CALCIUM  7.5* 5.9* 7.5* 7.6* 7.9* 7.6* 7.6*  MG 2.0 1.4*  --  2.1 1.8 1.9 2.0  PHOS 2.6  --   --   --  2.8 2.7 3.3   GFR: Estimated Creatinine Clearance: 102.5 mL/min (A) (by C-G formula based on SCr of 0.47 mg/dL (L)). Liver Function Tests: Recent Labs  Lab 11/17/23 0506 11/23/23 0407  AST 22 19  ALT 22 19  ALKPHOS 56 56  BILITOT 0.9 0.2  PROT 4.8* 4.4*  ALBUMIN  2.1* 1.7*   No results for input(s): LIPASE, AMYLASE in the last 168 hours. No results for input(s): AMMONIA in the last 168 hours. Coagulation Profile: No results for input(s): INR, PROTIME in the last 168 hours. Cardiac Enzymes: No  results for input(s): CKTOTAL, CKMB, CKMBINDEX, TROPONINI in the last 168 hours. BNP (last 3 results) No results for input(s): PROBNP in the last 8760 hours. HbA1C: No results for input(s): HGBA1C in the last 72 hours. CBG: Recent Labs  Lab 11/22/23 1725 11/23/23 0042 11/23/23 0536 11/23/23 1233 11/23/23 1701  GLUCAP 112* 113* 117* 120* 116*   Lipid Profile: No results for input(s): CHOL, HDL, LDLCALC, TRIG, CHOLHDL, LDLDIRECT in the last 72 hours. Thyroid  Function Tests: No results for input(s): TSH, T4TOTAL, FREET4, T3FREE, THYROIDAB in the last 72 hours. Anemia Panel: No results for input(s): VITAMINB12, FOLATE, FERRITIN, TIBC, IRON, RETICCTPCT in the last 72 hours. Sepsis Labs: Recent Labs  Lab 11/18/23 2322 11/19/23 0301 11/19/23 0857  LATICACIDVEN 2.9* 3.7* 1.4   Recent Results (from the past 240 hours)  Gastrointestinal Panel by PCR , Stool     Status: None   Collection Time: 11/14/23  6:42 PM   Specimen: STOOL  Result Value Ref Range Status   Campylobacter species NOT DETECTED NOT DETECTED Final   Plesimonas shigelloides NOT DETECTED NOT DETECTED Final   Salmonella species NOT DETECTED NOT DETECTED Final   Yersinia enterocolitica NOT DETECTED NOT DETECTED Final   Vibrio species NOT DETECTED NOT DETECTED Final   Vibrio cholerae NOT DETECTED NOT DETECTED Final   Enteroaggregative E coli (EAEC) NOT DETECTED NOT DETECTED Final   Enteropathogenic E coli (EPEC) NOT DETECTED NOT DETECTED Final   Enterotoxigenic E coli (ETEC) NOT DETECTED NOT DETECTED Final   Shiga like toxin producing E coli (STEC) NOT DETECTED NOT DETECTED Final   Shigella/Enteroinvasive E coli (EIEC) NOT DETECTED NOT DETECTED Final   Cryptosporidium NOT DETECTED NOT DETECTED Final   Cyclospora cayetanensis NOT DETECTED NOT DETECTED Final   Entamoeba histolytica NOT DETECTED NOT DETECTED Final   Giardia lamblia NOT DETECTED NOT DETECTED Final    Adenovirus F40/41 NOT DETECTED NOT DETECTED Final   Astrovirus NOT DETECTED NOT DETECTED Final   Norovirus GI/GII NOT DETECTED NOT DETECTED Final   Rotavirus A NOT DETECTED NOT DETECTED Final   Sapovirus (I, II, IV, and V) NOT DETECTED NOT DETECTED Final    Comment: Performed at [facility], [address] [place], [place] [address]  C Difficile Quick Screen w PCR reflex     Status:  Abnormal   Collection Time: 11/14/23  6:43 PM   Specimen: STOOL  Result Value Ref Range Status   C Diff antigen POSITIVE (A) NEGATIVE Final   C Diff toxin NEGATIVE NEGATIVE Final   C Diff interpretation Results are indeterminate. See PCR results.  Final    Comment: Performed at [facility], [address], [place], [place] [address]  C. Diff by PCR, Reflexed     Status: None   Collection Time: 11/14/23  6:43 PM  Result Value Ref Range Status   Toxigenic C. Difficile by PCR NEGATIVE NEGATIVE Final    Comment: Patient is colonized with non toxigenic C. difficile. May not need treatment unless significant symptoms are present.   Hypervirulent Strain PRESUMPTIVE NEGATIVE PRESUMPTIVE NEGATIVE Final    Comment: Performed at [facility], [address], [place], [place] [address]  MRSA Next Gen by PCR, Nasal     Status: None   Collection Time: 11/19/23 12:11 AM   Specimen: Nasal Mucosa; Nasal Swab  Result Value Ref Range Status   MRSA by PCR Next Gen NOT DETECTED NOT DETECTED Final    Comment: (NOTE) The GeneXpert MRSA Assay (FDA approved for NASAL specimens only), is one component of a comprehensive MRSA colonization surveillance program. It is not intended to diagnose MRSA infection nor to guide or monitor treatment for MRSA infections. Test performance is not FDA approved in patients less than 72 years old. Performed at [facility], [address] [place], [place] [address]     Radiology Studies: DG Fluoro Rm 1-60 Min - No Report Result Date:  11/23/2023 Fluoroscopy was utilized by the requesting physician.  No radiographic interpretation.   DG Fluoro Rm 1-60 Min - No Report Result Date: 11/23/2023 Fluoroscopy was utilized by the requesting physician.  No radiographic interpretation.   DG Abd 1 View Result Date: 11/23/2023 CLINICAL DATA:  Abdominal distension EXAM: ABDOMEN - 1 VIEW COMPARISON:  CT abdomen 11/16/2023 FINDINGS: Similar pattern of gas within the stomach, small bowel and right colon. Pattern is most consistent with either ileus or pseudo-obstruction. True obstruction is doubtful, particularly given the prior CT. IMPRESSION: Similar pattern of gas within the stomach, small bowel and right colon. Pattern is most consistent with either ileus or pseudo-obstruction. Electronically Signed   By: Oneil Officer M.D.   On: 11/23/2023 08:41   Scheduled Meds:  butamben -tetracaine -benzocaine   1 spray Topical Once   Chlorhexidine  Gluconate Cloth  6 each Topical Daily   fluticasone   1 spray Each Nare Daily   insulin  aspart  0-9 Units Subcutaneous Q8H   sodium chloride  flush  10-40 mL Intracatheter Q12H   thiamine  (VITAMIN B1) injection  100 mg Intravenous Daily   Continuous Infusions:  dextrose  5% lactated ringers  10 mL/hr at 11/23/23 1833   erythromycin      heparin  1,650 Units/hr (11/23/23 1840)   TPN ADULT (ION) 100 mL/hr at 11/23/23 1835    LOS: 10 days   Alejandro Marker, DO [facility] Available via Epic secure chat 7am-7pm After these hours, please refer to coverage provider listed on [email or web address] 11/23/2023, 7:09 PM

## 2023-11-23 NOTE — Progress Notes (Signed)
 PHARMACY - TOTAL PARENTERAL NUTRITION CONSULT NOTE   Indication: Bowel Obstruction  Patient Measurements: Height: 6' 4 (193 cm) Weight: 96.1 kg (211 lb 13.8 oz) IBW/kg (Calculated) : 86.8 TPN AdjBW (KG): 94.6 Body mass index is 25.79 kg/m. Usual Weight:   Assessment:  Pharmacy is consulted to start TPN on 73 yo male diagnosed with bowel obstruction. This admission CT abdomen shows increasing small bowel dilatation and fecalization of bowel contents involving the jejunum. No definitive transition point is identified at this time but caliber change is noted in the right mid abdomen at the junction of the jejunum and ileum. The more distal ileum is unremarkable. Some suspicion that bowel obstruction may be related to bortezomib  treatment pt receiving for multiple myeloma.   Glucose / Insulin : no hx of DM - CBG 112-125   - 1 unit of insulin  given  Electrolytes: WNL, improved after supplementation yesterday Renal: SCr < 1, BUN WNL Hepatic: LFTs, alk phos, bilirubin WNL.  Albumin  low at 1.7 Intake / Output; MIVF:  - Output: stool 550 mL, NG 1625 mL, urine 950 mL (0.4 ml/kg/hr) - Intake: D5LR at KVO, no enteral intake GI Imaging: -  Admitted from 10/14/23-10/20/23 for symptoms related to small bowel obstruction (transition point suspected in the right hemi-abdomen on CT imaging) and received non-operative management.  - Admitted 11/02/23 - 11/05/2023 for small bowel dilatation/concern for small bowel obstruction. CT imaging showed persistent small bowel dilatation with mild transition point noted deep in pelvis. He went to OR 11/03/23, findings were suspicious for intermittent volvulus of small bowel to explain his symptoms and CT imaging.  - 8/10  CT abdomen shows increasing small bowel dilatation and fecalization of bowel contents involving the jejunum. No definitive transition point is identified at this time but caliber change is noted in the right mid abdomen at the junction of the jejunum and  ileum.  GI Surgeries / Procedures:  8/1: to OR 11/03/23, findings were suspicious for intermittent volvulus of small bowel to explain his symptoms and CT imaging.  Central access:  PICC 8/18  Nutritional Goals: Goal TPN rate is 100  mL/hr (provides 132 g of protein and 2438 kcals per day)  RD Assessment:  Estimated Needs Total Energy Estimated Needs: 2400-2600 kcals Total Protein Estimated Needs: 120-135 grams Total Fluid Estimated Needs: >/= 2.4L  Current Nutrition:  NPO  Plan:  Continue TPN at 100 mL/hr, target rate  Electrolytes in TPN: (no changes) Na 75 mEq/L  K 60 mEq/L  Ca 5 mEq/L Mg 10 mEq/L Phos 20 mmol/L.  Cl:Ac 1:1 Add standard MVI and trace elements to TPN Decrease to q8h Sensitive SSI and adjust as needed  Thiamine  100 mg IV daily x 5 days through 8/22  M[facility]F at  [facility] mL/hr   Monitor TPN labs on Mon/Thurs.  Bmet/mag/phos with AM labs.   Wanda Hasting PharmD, BCPS [facility] main pharmacy [phone] 11/23/2023 7:08 AM

## 2023-11-23 NOTE — Progress Notes (Signed)
 PHARMACY - ANTICOAGULATION CONSULT NOTE  Pharmacy Consult for heparin  Indication: atrial fibrillation  Allergies  Allergen Reactions   Shrimp [Shellfish Allergy] Anaphylaxis and Swelling    Swelling throat    Reglan  [Metoclopramide ] Other (See Comments)    caused tardive dyskinesia    Patient Measurements: Height: 6' 4 (193 cm) Weight: 96.1 kg (211 lb 13.8 oz) IBW/kg (Calculated) : 86.8 HEPARIN  DW (KG): 94.6  Vital Signs: Temp: 97.9 F (36.6 C) (08/21 0000) Temp Source: Oral (08/21 0000) BP: 132/63 (08/20 2100) Pulse Rate: 91 (08/20 2100)  Labs: Recent Labs    11/21/23 0629 11/21/23 1804 11/22/23 0523 11/23/23 0407  HGB 10.4*  --  9.7* 8.8*  HCT 32.2*  --  29.9* 27.6*  PLT 239  --  209 183  APTT 71*  --   --   --   HEPARINUNFRC 0.86* 0.46 0.36 0.12*  CREATININE 0.76  --  0.49*  --     Estimated Creatinine Clearance: 102.5 mL/min (A) (by C-G formula based on SCr of 0.49 mg/dL (L)).   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  Patient reports he does not have symptoms as of 02/25/14.     Diarrhea 06/04/2023   Essential hypertension 06/04/2023   Fatty liver 06/04/2023   Flu 06/04/2023   Hyperlipidemia 06/04/2023   Ileus (HCC) 06/04/2023   Multiple myeloma (HCC)    Neuropathy    Paroxysmal atrial fibrillation (HCC) 07/04/2023   Pneumonia 06/04/2023     Assessment: 73 year old male with afib on Eliquis  PTA which is currently on hold. Patient recently admitted for bowel obstruction and discharged home after conservative treatment with NGT decompression. He presented back with similar concerns, currently being treated for ileus vs pSBO, plan to start TPN given prolonged hospitalization without nutrition. His Eliquis  was initially  resumed on admission and discontinued on 8/14. Has been on prophylactic Lovenox . Pharmacy consulted for heparin  initiation and management.  Today, 11/23/23 HL 0.12 subtherapeutic on 1050 units/hr Hgb 8.8, plts 183   Goal of Therapy:  Heparin  level 0.3-0.7 units/ml Monitor platelets by anticoagulation protocol: Yes   Plan:  - bolus heparin  2500 units x 1 - increase heparin  drip to 1350 - heparin  level in 8 hours -Daily CBC, HL -Continue to follow for transition back to Eliquis  as appropriate   Leeroy Mace RPh 11/23/2023, 5:23 AM

## 2023-11-23 NOTE — Progress Notes (Signed)
   Subjective/Chief Complaint: No changes.  NGT came out.  Currently attempting to get it replaced.  Still with liquid stool in rectal pouch   Objective: Vital signs in last 24 hours: Temp:  [97.6 F (36.4 C)-99.6 F (37.6 C)] 99.6 F (37.6 C) (08/21 0800) Pulse Rate:  [33-93] 90 (08/21 1000) Resp:  [15-25] 25 (08/21 1000) BP: (101-150)/(51-81) 150/65 (08/21 1000) SpO2:  [100 %] 100 % (08/21 1000) Last BM Date : 11/22/23  Intake/Output from previous day: 08/20 0701 - 08/21 0700 In: 3006.8 [I.V.:2446.7; IV Piggyback:560.2] Out: 2425 [Urine:950; Emesis/NG output:925; Stool:550] Intake/Output this shift: Total I/O In: 80.3 [I.V.:80.3] Out: -   Gen: lethargic due to recent pain meds, NAD Ab soft, ND, nontender, incisions clean   Lab Results:  Recent Labs    11/22/23 0523 11/23/23 0407  WBC 13.8* 8.7  HGB 9.7* 8.8*  HCT 29.9* 27.6*  PLT 209 183   BMET Recent Labs    11/22/23 0523 11/23/23 0407  NA 133* 136  K 3.6 4.3  CL 103 103  CO2 25 27  GLUCOSE 127* 120*  BUN 17 15  CREATININE 0.49* 0.47*  CALCIUM  7.6* 7.6*   PT/INR No results for input(s): LABPROT, INR in the last 72 hours. ABG No results for input(s): PHART, HCO3 in the last 72 hours.  Invalid input(s): PCO2, PO2  Studies/Results: DG Abd 1 View Result Date: 11/23/2023 CLINICAL DATA:  Abdominal distension EXAM: ABDOMEN - 1 VIEW COMPARISON:  CT abdomen 11/16/2023 FINDINGS: Similar pattern of gas within the stomach, small bowel and right colon. Pattern is most consistent with either ileus or pseudo-obstruction. True obstruction is doubtful, particularly given the prior CT. IMPRESSION: Similar pattern of gas within the stomach, small bowel and right colon. Pattern is most consistent with either ileus or pseudo-obstruction. Electronically Signed   By: Oneil Officer M.D.   On: 11/23/2023 08:41    Anti-infectives: Anti-infectives (From admission, onward)    Start     Dose/Rate Route  Frequency Ordered Stop   11/23/23 1200  erythromycin  (E-MYCIN ) tablet 250 mg        250 mg Oral 3 times daily before meals 11/23/23 1004     11/18/23 1200  erythromycin  (E-MYCIN ) tablet 250 mg  Status:  Discontinued        250 mg Oral 2 times daily with meals 11/18/23 0720 11/19/23 0734   11/13/23 1000  acyclovir  (ZOVIRAX ) tablet 400 mg  Status:  Discontinued        400 mg Oral 2 times daily 11/13/23 0503 11/19/23 0739       Assessment/Plan: POD 20, dx lsc for possible sbo-negative, 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

## 2023-11-23 NOTE — Progress Notes (Signed)
 PHARMACY - ANTICOAGULATION CONSULT NOTE  Pharmacy Consult for heparin  Indication: atrial fibrillation  Allergies  Allergen Reactions   Shrimp [Shellfish Allergy] Anaphylaxis and Swelling    Swelling throat    Reglan  [Metoclopramide ] Other (See Comments)    caused tardive dyskinesia    Patient Measurements: Height: 6' 4 (193 cm) Weight: 96.1 kg (211 lb 13.8 oz) IBW/kg (Calculated) : 86.8 HEPARIN  DW (KG): 94.6  Vital Signs: Temp: 98.1 F (36.7 C) (08/21 1650) Temp Source: Axillary (08/21 1650) BP: 133/63 (08/21 1700) Pulse Rate: 52 (08/21 1700)  Labs: Recent Labs    11/21/23 0629 11/21/23 1804 11/22/23 0523 11/23/23 0407 11/23/23 1442 11/23/23 1654  HGB 10.4*  --  9.7* 8.8*  --   --   HCT 32.2*  --  29.9* 27.6*  --   --   PLT 239  --  209 183  --   --   APTT 71*  --   --   --   --   --   HEPARINUNFRC 0.86*   < > 0.36 0.12* <0.10* <0.10*  CREATININE 0.76  --  0.49* 0.47*  --   --    < > = values in this interval not displayed.    Estimated Creatinine Clearance: 102.5 mL/min (A) (by C-G formula based on SCr of 0.47 mg/dL (L)).   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  Patient reports he does not have symptoms as of 02/25/14.     Diarrhea 06/04/2023   Essential hypertension 06/04/2023   Fatty liver 06/04/2023   Flu 06/04/2023   Hyperlipidemia 06/04/2023   Ileus (HCC) 06/04/2023   Multiple myeloma (HCC)    Neuropathy    Paroxysmal atrial fibrillation (HCC) 07/04/2023   Pneumonia 06/04/2023     Assessment: 73 year old male with afib on Eliquis  PTA which is currently on hold. Patient recently admitted for bowel obstruction and discharged home after conservative treatment with NGT decompression. He presented back with similar  concerns, currently being treated for ileus vs pSBO, plan to start TPN given prolonged hospitalization without nutrition. His Eliquis  was initially resumed on admission and discontinued on 8/14. Has been on prophylactic Lovenox . Pharmacy consulted for heparin  initiation and management.  Today, 11/23/23 -HL < 0.1 (repeat level confirmed) now undetectable after increasing heparin  infusion to 1350 units/hr -Hgb 8.8, plts 183 - trending down -Per RN, both arms swollen. PICC in left arm; midline in right arm where heparin  is infusing. Given these are his only lines, would expect lower risk of infiltration and both appear to be functioning at this time -No complications of therapy noted   Goal of Therapy:  Heparin  level 0.3-0.7 units/ml Monitor platelets by anticoagulation protocol: Yes   Plan:  -Bolus heparin  2800 units -Increase heparin  infusion to 1650 units/hr -Check heparin  level 8 hours after rate increase -Daily CBC, HL -Continue to follow for transition back to Eliquis  as appropriate   Stefano MARLA Bologna, PharmD, BCPS Clinical Pharmacist 11/23/2023 6:22 PM

## 2023-11-24 ENCOUNTER — Inpatient Hospital Stay (HOSPITAL_COMMUNITY)

## 2023-11-24 DIAGNOSIS — K5669 Other partial intestinal obstruction: Secondary | ICD-10-CM | POA: Diagnosis not present

## 2023-11-24 DIAGNOSIS — K567 Ileus, unspecified: Secondary | ICD-10-CM | POA: Diagnosis not present

## 2023-11-24 DIAGNOSIS — K5989 Other specified functional intestinal disorders: Secondary | ICD-10-CM | POA: Diagnosis not present

## 2023-11-24 DIAGNOSIS — R0602 Shortness of breath: Secondary | ICD-10-CM | POA: Diagnosis not present

## 2023-11-24 DIAGNOSIS — K3184 Gastroparesis: Secondary | ICD-10-CM | POA: Diagnosis not present

## 2023-11-24 DIAGNOSIS — Z4682 Encounter for fitting and adjustment of non-vascular catheter: Secondary | ICD-10-CM | POA: Diagnosis not present

## 2023-11-24 DIAGNOSIS — I480 Paroxysmal atrial fibrillation: Secondary | ICD-10-CM | POA: Diagnosis not present

## 2023-11-24 DIAGNOSIS — E876 Hypokalemia: Secondary | ICD-10-CM | POA: Diagnosis not present

## 2023-11-24 DIAGNOSIS — C9000 Multiple myeloma not having achieved remission: Secondary | ICD-10-CM | POA: Diagnosis not present

## 2023-11-24 LAB — COMPREHENSIVE METABOLIC PANEL WITH GFR
ALT: 27 U/L (ref 0–44)
AST: 32 U/L (ref 15–41)
Albumin: 1.8 g/dL — ABNORMAL LOW (ref 3.5–5.0)
Alkaline Phosphatase: 68 U/L (ref 38–126)
Anion gap: 5 (ref 5–15)
BUN: 16 mg/dL (ref 8–23)
CO2: 28 mmol/L (ref 22–32)
Calcium: 7.8 mg/dL — ABNORMAL LOW (ref 8.9–10.3)
Chloride: 99 mmol/L (ref 98–111)
Creatinine, Ser: 0.41 mg/dL — ABNORMAL LOW (ref 0.61–1.24)
GFR, Estimated: >60 mL/min (ref >60)
Glucose, Bld: 114 mg/dL — ABNORMAL HIGH (ref 70–99)
Potassium: 4.4 mmol/L (ref 3.5–5.1)
Sodium: 132 mmol/L — ABNORMAL LOW (ref 135–145)
Total Bilirubin: 0.4 mg/dL (ref 0.0–1.2)
Total Protein: 5.1 g/dL — ABNORMAL LOW (ref 6.5–8.1)

## 2023-11-24 LAB — CBC WITH DIFFERENTIAL/PLATELET
Abs Immature Granulocytes: 0.11 K/uL — ABNORMAL HIGH (ref 0.00–0.07)
Basophils Absolute: 0 K/uL (ref 0.0–0.1)
Basophils Relative: 0 %
Eosinophils Absolute: 0.1 K/uL (ref 0.0–0.5)
Eosinophils Relative: 1 %
HCT: 30.6 % — ABNORMAL LOW (ref 39.0–52.0)
Hemoglobin: 9.7 g/dL — ABNORMAL LOW (ref 13.0–17.0)
Immature Granulocytes: 1 %
Lymphocytes Relative: 5 %
Lymphs Abs: 0.7 K/uL (ref 0.7–4.0)
MCH: 30.1 pg (ref 26.0–34.0)
MCHC: 31.7 g/dL (ref 30.0–36.0)
MCV: 95 fL (ref 80.0–100.0)
Monocytes Absolute: 0.8 K/uL (ref 0.1–1.0)
Monocytes Relative: 6 %
Neutro Abs: 11.3 K/uL — ABNORMAL HIGH (ref 1.7–7.7)
Neutrophils Relative %: 87 %
Platelets: 195 K/uL (ref 150–400)
RBC: 3.22 MIL/uL — ABNORMAL LOW (ref 4.22–5.81)
RDW: 17.3 % — ABNORMAL HIGH (ref 11.5–15.5)
WBC: 13.1 K/uL — ABNORMAL HIGH (ref 4.0–10.5)
nRBC: 0 % (ref 0.0–0.2)

## 2023-11-24 LAB — PHOSPHORUS: Phosphorus: 2.8 mg/dL (ref 2.5–4.6)

## 2023-11-24 LAB — HEPARIN LEVEL (UNFRACTIONATED)
Heparin Unfractionated: 0.17 [IU]/mL — ABNORMAL LOW (ref 0.30–0.70)
Heparin Unfractionated: 0.31 [IU]/mL (ref 0.30–0.70)

## 2023-11-24 LAB — GLUCOSE, CAPILLARY
Glucose-Capillary: 119 mg/dL — ABNORMAL HIGH (ref 70–99)
Glucose-Capillary: 119 mg/dL — ABNORMAL HIGH (ref 70–99)
Glucose-Capillary: 94 mg/dL (ref 70–99)

## 2023-11-24 LAB — MAGNESIUM: Magnesium: 1.9 mg/dL (ref 1.7–2.4)

## 2023-11-24 MED ORDER — HEPARIN BOLUS VIA INFUSION
2800.0000 [IU] | Freq: Once | INTRAVENOUS | Status: AC
  Administered 2023-11-24: 2800 [IU] via INTRAVENOUS
  Filled 2023-11-24: qty 2800

## 2023-11-24 MED ORDER — TRAVASOL 10 % IV SOLN
INTRAVENOUS | Status: AC
  Filled 2023-11-24: qty 1320

## 2023-11-24 MED ORDER — MAGNESIUM SULFATE 2 GM/50ML IV SOLN
2.0000 g | Freq: Once | INTRAVENOUS | Status: AC
  Administered 2023-11-24: 2 g via INTRAVENOUS

## 2023-11-24 MED ORDER — IOHEXOL 300 MG/ML  SOLN
150.0000 mL | Freq: Once | INTRAMUSCULAR | Status: AC | PRN
  Administered 2023-11-24: 150 mL

## 2023-11-24 MED ORDER — SODIUM CHLORIDE 0.9 % IV SOLN
500.0000 mg | Freq: Three times a day (TID) | INTRAVENOUS | Status: DC
  Administered 2023-11-24 – 2023-11-27 (×9): 500 mg via INTRAVENOUS
  Filled 2023-11-24 (×10): qty 10

## 2023-11-24 NOTE — Progress Notes (Signed)
 VASCULAR LAB    Attempted bilateral LEV X 2.  Patient in procedure all morning with an hour or more left to go.  The vascular lab will re-attempt according to patient and vascular staff availability    RN aware.   Chizuko Trine, RVT 11/24/2023, 11:33 AM

## 2023-11-24 NOTE — Progress Notes (Signed)
 PHARMACY - TOTAL PARENTERAL NUTRITION CONSULT NOTE   Indication: Bowel Obstruction  Patient Measurements: Height: 6' 4 (193 cm) Weight: 96.3 kg (212 lb 4.9 oz) IBW/kg (Calculated) : 86.8 TPN AdjBW (KG): 94.6 Body mass index is 25.84 kg/m. Usual Weight:   Assessment:  Pharmacy is consulted to start TPN on 73 yo male diagnosed with bowel obstruction. This admission CT abdomen shows increasing small bowel dilatation and fecalization of bowel contents involving the jejunum. No definitive transition point is identified at this time but caliber change is noted in the right mid abdomen at the junction of the jejunum and ileum. The more distal ileum is unremarkable. Some suspicion that bowel obstruction may be related to bortezomib  treatment pt receiving for multiple myeloma.   Glucose / Insulin : no hx of DM - CBG 94-120   - 0 unit of insulin  given  Electrolytes:  - Na low at 132, magnesium  is borderline at 1.9  - Other electrolytes are WNL including CorrCa at 9.6  Renal: SCr < 1, BUN WNL Hepatic: LFTs, alk phos, bilirubin WNL.  Albumin  low at 1.8 Intake / Output; MIVF:  - Output: stool 3x unmeasured, NG 1920 mL, 1450 mL - Intake: D5LR at KVO, no enteral intake GI Imaging: -  Admitted from 10/14/23-10/20/23 for symptoms related to small bowel obstruction (transition point suspected in the right hemi-abdomen on CT imaging) and received non-operative management.  - Admitted 11/02/23 - 11/05/2023 for small bowel dilatation/concern for small bowel obstruction. CT imaging showed persistent small bowel dilatation with mild transition point noted deep in pelvis. He went to OR 11/03/23, findings were suspicious for intermittent volvulus of small bowel to explain his symptoms and CT imaging.  - 8/10  CT abdomen shows increasing small bowel dilatation and fecalization of bowel contents involving the jejunum. No definitive transition point is identified at this time but caliber change is noted in the right mid  abdomen at the junction of the jejunum and ileum.  GI Surgeries / Procedures:  8/1: to OR 11/03/23, findings were suspicious for intermittent volvulus of small bowel to explain his symptoms and CT imaging.  Central access:  PICC 8/18  Nutritional Goals: Goal TPN rate is 100  mL/hr (provides 132 g of protein and 2438 kcals per day)  RD Assessment:  Estimated Needs Total Energy Estimated Needs: 2400-2600 kcals Total Protein Estimated Needs: 120-135 grams Total Fluid Estimated Needs: >/= 2.4L  Current Nutrition:  NPO  Plan:  Continue TPN at 100 mL/hr, target rate  Electrolytes in TPN: (no changes) Inc Na to 95 mEq/L  K 60 mEq/L  Ca 5 mEq/L Mg 10 mEq/L Phos 20 mmol/L.  Cl:Ac 1:1 Add standard MVI and trace elements to TPN Continue q8h Sensitive SSI and adjust as needed  Thiamine  100 mg IV daily x 5 days through 8/22  [facility]   Monitor TPN labs on Mon/Thurs.  Bmet/mag/phos with AM labs.   Dolphus Roller, PharmD, BCPS 11/24/2023 11:12 AM

## 2023-11-24 NOTE — Progress Notes (Signed)
 Patient transported to radiology this AM for SBFT exam. Water soluble contrast introduced via NG tube, and images obtained every 15 min, then every 30 min for 3 hrs following this. No movement of contrast into small bowel despite positioning maneuvers. Spoke with Dr. Landy who is supervising the case.   Patient's level of care requires ICU nursing needs. Patient will be returned to the floor with plan for portable KUB 1pm (4hrs), 5pm (8hrs), and 9am on 8/23 (24hrs).   NG should remain clamped at this time as contrast remains in the stomach.

## 2023-11-24 NOTE — Progress Notes (Signed)
 Physical Therapy Treatment Patient Details Name: David Mcgrath MRN: [id number] DOB: October 10, 1950 Today's Date: 11/24/2023   History of Present Illness David Mcgrath is a 73 y.o. male with  who presents to the ER with complaints of hypotension associated with cramping abdominal pain and poor oral intake for the past few days. PMH: hypertension, hyperlipidemia, paroxysmal A-fib on Eliquis , chronic anxiety/depression, polyneuropathy, multiple myeloma (diagnosed March 2025), recently admitted, from 7/31 through 11/05/2023, due to intractable nausea and vomiting from recurrent small bowel obstruction, had a diagnostic laparoscopy    PT Comments   Pt admitted with above diagnosis.  Pt currently with functional limitations due to the deficits listed below (see PT Problem List). Pt in bed when PT arrived. OT and nursing in room. Pt agreeable to therapy intervention. Pt required max A x 2 for supine to sit, 2 attempts for sit to stand  with use of stedy for SPT with max A x 3 to recliner. Pt left seated in recliner, all needs in place and nursing staff present. Nursing staff is aware of use of total lift to assist pt back to bed.  Patient will benefit from continued inpatient follow up therapy, <3 hours/day.  Pt will benefit from acute skilled PT to increase their independence and safety with mobility to allow discharge.      If plan is discharge home, recommend the following: Assistance with cooking/housework;Help with stairs or ramp for entrance;A little help with bathing/dressing/bathroom;Assist for transportation;Two people to help with walking and/or transfers   Can travel by private vehicle     No  Equipment Recommendations  None recommended by PT    Recommendations for Other Services       Precautions / Restrictions Precautions Precautions: Fall Recall of Precautions/Restrictions: Intact Precaution/Restrictions Comments: NG,  rectal collection Restrictions Weight Bearing  Restrictions Per Provider Order: No     Mobility  Bed Mobility Overal bed mobility: Needs Assistance Bed Mobility: Sidelying to Sit   Sidelying to sit: HOB elevated, Max assist, +2 for physical assistance, +2 for safety/equipment Supine to sit: Mod assist, Used rails, HOB elevated     General bed mobility comments: pt required A for B LE toward EOB and trunk with use of bed pad, max A x 2 to scoot anteriorly in sitting. pt required cues for attention to midline and cervical extension throughout intervention. min A for initial sitting balance and pt able to progress to CGA    Transfers Overall transfer level: Needs assistance   Transfers: Bed to chair/wheelchair/BSC, Sit to/from Stand Sit to Stand: Max assist, +2 physical assistance, +2 safety/equipment, Via lift equipment, From elevated surface (+3) Stand pivot transfers: Max assist, +2 physical assistance, +2 safety/equipment         General transfer comment: PT/OT attempted pull to stand at stedy pt unable to demonstrate power up to extension to clear buttocks for placement of lift seat pads, A x 3 with PT providing A posteriorly to clear buttocks and A x 2 to safely complete SPT on stedy to recliner, pt required cues and encouragement for standing and cervical extension. Transfer via Lift Equipment: Stedy  Ambulation/Gait               General Gait Details: NT   Stairs             Wheelchair Mobility     Tilt Bed    Modified Rankin (Stroke Patients Only)       Balance Overall balance assessment: Needs assistance  Sitting-balance support: Bilateral upper extremity supported, Feet supported Sitting balance-Leahy Scale: Poor Sitting balance - Comments: pt required mod A to CGA for unsupported sitting EOB with feet on floor and B UE support   Standing balance support: During functional activity, Reliant on assistive device for balance, Bilateral upper extremity supported Standing balance-Leahy Scale:  Zero Standing balance comment: Unable to come to upright stance due to [employer] [facility] Communication Communication: No apparent difficulties  Cognition Arousal: Alert Behavior During Therapy: WFL for tasks assessed/performed, Flat affect   PT - Cognitive impairments: No apparent impairments                         Following commands: Intact      Cueing Cueing Techniques: Verbal cues  Exercises      General Comments        Pertinent Vitals/Pain Pain Assessment Pain Assessment: No/denies pain Pain Location: except when NG ig moved    Home Living Family/patient expects to be discharged to:: Private residence Living Arrangements: Spouse/significant other Available Help at Discharge: Family;Friend(s);Neighbor (friends and neighbor PRN) Type of Home: House Home Access: Ramped entrance Entrance Stairs-Rails: Left Entrance Stairs-Number of Steps: 2   Home Layout: One level Home Equipment: Pharmacist, hospital (2 wheels);Cane - single point;Wheelchair - manual;Grab bars - toilet;Grab bars - tub/shower;Hand held shower head;Adaptive equipment Additional Comments: Been using RW since recent d/c. Was getting HHPT/OT.    Prior Function            PT Goals (current goals can now be found in the care plan section) Acute Rehab PT Goals Patient Stated Goal: Go home and improve strength PT Goal Formulation: With patient Time For Goal Achievement: 11/27/23 Potential to Achieve Goals: Good Progress towards PT goals: Progressing toward goals (slowly)    Frequency    Min 2X/week      PT Plan      Co-evaluation PT/OT/SLP Co-Evaluation/Treatment: Yes Reason for Co-Treatment: Complexity of the patient's impairments (multi-system involvement);For patient/therapist safety;To address functional/ADL transfers PT goals addressed during session: Mobility/safety with mobility;Balance;Proper use of DME OT goals addressed  during session: ADL's and self-care;Proper use of Adaptive equipment and DME      AM-PAC PT 6 Clicks Mobility   Outcome Measure  Help needed turning from your back to your side while in a flat bed without using bedrails?: A Little Help needed moving from lying on your back to sitting on the side of a flat bed without using bedrails?: A Lot Help needed moving to and from a bed to a chair (including a wheelchair)?: Total Help needed standing up from a chair using your arms (e.g., wheelchair or bedside chair)?: Total Help needed to walk in hospital room?: Total Help needed climbing 3-5 steps with a railing? : Total 6 Click Score: 9    End of Session Equipment Utilized During Treatment: Gait belt Activity Tolerance: Patient limited by fatigue;Patient tolerated treatment well Patient left: with call bell/phone within reach;in chair;with nursing/sitter in room Nurse Communication: Mobility status;Need for lift equipment PT Visit Diagnosis: Unsteadiness on feet (R26.81);Other abnormalities of gait and mobility (R26.89);Muscle weakness (generalized) (M62.81)     Time: 8464-8441 PT Time Calculation (min) (ACUTE ONLY): 23 min  Charges:    $Therapeutic Activity: 8-22 mins PT General Charges $$ ACUTE PT VISIT: 1 Visit  [place], PT Acute Rehab    [place] VEAR Drone 11/24/2023, 4:36 PM

## 2023-11-24 NOTE — Progress Notes (Signed)
 Subjective/Chief Complaint: No changes.  NGT replaced with 1900cc out yesterday and still with some liquid stool in rectal pouch.     Objective: Vital signs in last 24 hours: Temp:  [97.8 F (36.6 C)-99.3 F (37.4 C)] 98.5 F (36.9 C) (08/22 0734) Pulse Rate:  [36-107] 36 (08/22 0600) Resp:  [12-38] 35 (08/22 0600) BP: (112-150)/(52-96) 140/77 (08/22 0600) SpO2:  [92 %-100 %] 99 % (08/22 0600) Weight:  [96.3 kg] 96.3 kg (08/22 0409) Last BM Date : 11/24/23  Intake/Output from previous day: 08/21 0701 - 08/22 0700 In: 3204.6 [I.V.:2985.3; IV Piggyback:219.3] Out: 3370 [Urine:1450; Emesis/NG output:1920] Intake/Output this shift: No intake/output data recorded.  Gen: mostly sleeping Ab soft, ND, nontender, incisions clean   Lab Results:  Recent Labs    11/23/23 0407 11/24/23 0401  WBC 8.7 13.1*  HGB 8.8* 9.7*  HCT 27.6* 30.6*  PLT 183 195   BMET Recent Labs    11/23/23 0407 11/24/23 0401  NA 136 132*  K 4.3 4.4  CL 103 99  CO2 27 28  GLUCOSE 120* 114*  BUN 15 16  CREATININE 0.47* 0.41*  CALCIUM  7.6* 7.8*   PT/INR No results for input(s): LABPROT, INR in the last 72 hours. ABG No results for input(s): PHART, HCO3 in the last 72 hours.  Invalid input(s): PCO2, PO2  Studies/Results: DG Fluoro Rm 1-60 Min - No Report Result Date: 11/23/2023 Fluoroscopy was utilized by the requesting physician.  No radiographic interpretation.   DG Fluoro Rm 1-60 Min - No Report Result Date: 11/23/2023 Fluoroscopy was utilized by the requesting physician.  No radiographic interpretation.   DG Abd 1 View Result Date: 11/23/2023 CLINICAL DATA:  Abdominal distension EXAM: ABDOMEN - 1 VIEW COMPARISON:  CT abdomen 11/16/2023 FINDINGS: Similar pattern of gas within the stomach, small bowel and right colon. Pattern is most consistent with either ileus or pseudo-obstruction. True obstruction is doubtful, particularly given the prior CT. IMPRESSION: Similar pattern  of gas within the stomach, small bowel and right colon. Pattern is most consistent with either ileus or pseudo-obstruction. Electronically Signed   By: Oneil Officer M.D.   On: 11/23/2023 08:41    Anti-infectives: Anti-infectives (From admission, onward)    Start     Dose/Rate Route Frequency Ordered Stop   11/23/23 2000  erythromycin  250 mg in sodium chloride  0.9 % 100 mL IVPB        250 mg 100 mL/hr over 60 Minutes Intravenous Every 8 hours 11/23/23 1744     11/23/23 1200  erythromycin  (E-MYCIN ) tablet 250 mg  Status:  Discontinued        250 mg Oral 3 times daily before meals 11/23/23 1004 11/23/23 1744   11/18/23 1200  erythromycin  (E-MYCIN ) tablet 250 mg  Status:  Discontinued        250 mg Oral 2 times daily with meals 11/18/23 0720 11/19/23 0734   11/13/23 1000  acyclovir  (ZOVIRAX ) tablet 400 mg  Status:  Discontinued        400 mg Oral 2 times daily 11/13/23 0503 11/19/23 0739       Assessment/Plan: POD 21, dx lsc for possible sbo-negative, 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

## 2023-11-24 NOTE — Progress Notes (Signed)
 PHARMACY - ANTICOAGULATION CONSULT NOTE  Pharmacy Consult for heparin  Indication: atrial fibrillation  Allergies  Allergen Reactions   Shrimp [Shellfish Allergy] Anaphylaxis and Swelling    Swelling throat    Reglan  [Metoclopramide ] Other (See Comments)    caused tardive dyskinesia    Patient Measurements: Height: 6' 4 (193 cm) Weight: 96.3 kg (212 lb 4.9 oz) IBW/kg (Calculated) : 86.8 HEPARIN  DW (KG): 94.6  Vital Signs: Temp: 99.3 F (37.4 C) (08/22 0351) Temp Source: Axillary (08/22 0351) BP: 132/57 (08/22 0100) Pulse Rate: 98 (08/22 0100)  Labs: Recent Labs    11/21/23 0629 11/21/23 1804 11/22/23 0523 11/23/23 0407 11/23/23 1442 11/23/23 1654 11/24/23 0401  HGB 10.4*  --  9.7* 8.8*  --   --  9.7*  HCT 32.2*  --  29.9* 27.6*  --   --  30.6*  PLT 239  --  209 183  --   --  195  APTT 71*  --   --   --   --   --   --   HEPARINUNFRC 0.86*   < > 0.36 0.12* <0.10* <0.10* 0.17*  CREATININE 0.76  --  0.49* 0.47*  --   --   --    < > = values in this interval not displayed.    Estimated Creatinine Clearance: 102.5 mL/min (A) (by C-G formula based on SCr of 0.47 mg/dL (L)).   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  Patient reports he does not have symptoms as of 02/25/14.     Diarrhea 06/04/2023   Essential hypertension 06/04/2023   Fatty liver 06/04/2023   Flu 06/04/2023   Hyperlipidemia 06/04/2023   Ileus (HCC) 06/04/2023   Multiple myeloma (HCC)    Neuropathy    Paroxysmal atrial fibrillation (HCC) 07/04/2023   Pneumonia 06/04/2023     Assessment: 73 year old male with afib on Eliquis  PTA which is currently on hold. Patient recently admitted for bowel obstruction and discharged home after conservative treatment with NGT  decompression. He presented back with similar concerns, currently being treated for ileus vs pSBO, plan to start TPN given prolonged hospitalization without nutrition. His Eliquis  was initially resumed on admission and discontinued on 8/14. Has been on prophylactic Lovenox . Pharmacy consulted for heparin  initiation and management.  Today, 11/24/23 -HL 0.17 subtherapeutic on 1650 units/hr - Hgb 9.7, plts 195 -No complications of therapy noted   Goal of Therapy:  Heparin  level 0.3-0.7 units/ml Monitor platelets by anticoagulation protocol: Yes   Plan:  -Bolus heparin  2800 units -Increase heparin  infusion to 1900 units/hr -Check heparin  level 8 hours after rate increase -Daily CBC, HL -Continue to follow for transition back to Eliquis  as appropriate   Leeroy Mace RPh 11/24/2023, 5:01 AM

## 2023-11-24 NOTE — Progress Notes (Signed)
 Occupational Therapy Treatment Patient Details Name: David Mcgrath MRN: [id number] DOB: 04-09-1950 Today's Date: 11/24/2023   History of present illness David Mcgrath is a 73 yr old male  who presented to the ER with complaints of hypotension associated with cramping abdominal pain and poor oral intake for the past few days. PMH: hypertension, hyperlipidemia, paroxysmal A-fib on Eliquis , chronic anxiety/depression, polyneuropathy, multiple myeloma (diagnosed March 2025), recently admitted, from 7/31 through 11/05/2023, due to intractable nausea and vomiting from recurrent small bowel obstruction, had a diagnostic laparoscopy   OT comments  The pt was seen for functional strengthening and progression of out of bed activity. Pt with generalized weakness, and required AAROM to PROM for stretches and simple exercises for all extremities in bed. He then required max assist x2 for supine to sit. He required CGA to mod assist for sitting balance edge of bed, with frequent cues and instruction on implementing neck extension. Assist x3 required to stand from EOB using stedy device, then to transfer to bedside chair. Pt was instructed on proper body positioning in the chair, including elevating BUE, to assist with edema management. Pt also noted to be with pronounced deconditioning and debility. Continue OT plan of care. Patient will benefit from continued inpatient follow up therapy, <3 hours/day.       If plan is discharge home, recommend the following:  Two people to help with walking and/or transfers;Two people to help with bathing/dressing/bathroom;Assistance with cooking/housework;Assistance with feeding;Direct supervision/assist for medications management;Direct supervision/assist for financial management;Assist for transportation;Help with stairs or ramp for entrance   Equipment Recommendations  Other (comment) (defer to next level of care)    Recommendations for Other Services       Precautions / Restrictions Precautions Precautions: Fall Precaution/Restrictions Comments:  (NG tube, rectal tube) Restrictions Weight Bearing Restrictions Per Provider Order: No       Mobility Bed Mobility Overal bed mobility: Needs Assistance Bed Mobility: Sidelying to Sit   Sidelying to sit: HOB elevated, Max assist, +2 for physical assistance, +2 for safety/equipment Supine to sit: Mod assist, Used rails, HOB elevated     General bed mobility comments: pt required A for B LE toward EOB and trunk with use of bed pad, max A x 2 to scoot anteriorly in sitting. pt required cues for attention to midline and cervical extension throughout intervention. min A for initial sitting balance and pt able to progress to CGA    Transfers Overall transfer level: Needs assistance   Transfers: Bed to chair/wheelchair/BSC, Sit to/from Stand Sit to Stand: Max assist, +2 physical assistance, +2 safety/equipment, Via lift equipment, From elevated surface Stand pivot transfers: Max assist, +2 physical assistance, +2 safety/equipment, From elevated surface         General transfer comment: PT/OT attempted assist pt to stand at stedy, however pt unable to demonstrate power up into extension to clear buttocks off bed. Required assist x3 to clear buttocks off bed and assist x 2 to safely complete transfer from stedy to recliner. Pt also required cues and instruction for cervical extension. Transfer via Lift Equipment: Psychologist, sport and exercise - Comments: pt required mod A to CGA for unsupported sitting EOB with feet on floor and B UE support     Standing balance-Leahy Scale: Zero          ADL either performed or assessed with clinical judgement   ADL Overall ADL's : Needs assistance/impaired  Lower Body Dressing: Total assistance;Bed level Lower Body Dressing Details (indicate cue type and reason): for management of socks                     Extremity/Trunk Assessment Upper Extremity Assessment RUE Deficits / Details: Severe weakness with need of AAROM with more proximal ROM. LUE Deficits / Details: Severe weakness with need of AAROM with more proximal ROM.  Hand very edematous and elevated.       Vision Baseline Vision/History: 1 Wears glasses     Perception   Praxis   Communication Communication Communication: No apparent difficulties   Cognition Arousal: Alert Behavior During Therapy: WFL for tasks assessed/performed, Flat affect Cognition: Difficult to assess      Executive functioning impairment (select all impairments): Problem solving        Following commands: Intact        Cueing   Cueing Techniques: Verbal cues             Pertinent Vitals/ Pain       Pain Assessment Pain Assessment: No/denies pain   Frequency  Min 2X/week        Progress Toward Goals  OT Goals(current goals can now be found in the care plan section)     Acute Rehab OT Goals OT Goal Formulation: With patient Time For Goal Achievement: 11/28/23 Potential to Achieve Goals: Fair  Plan      Co-evaluation    PT/OT/SLP Co-Evaluation/Treatment: Yes Reason for Co-Treatment: Complexity of the patient's impairments (multi-system involvement);For patient/therapist safety;To address functional/ADL transfers PT goals addressed during session: Mobility/safety with mobility;Balance;Proper use of DME OT goals addressed during session: ADL's and self-care;Proper use of Adaptive equipment and DME      AM-PAC OT 6 Clicks Daily Activity     Outcome Measure   Help from another person eating meals?: (S) Total Help from another person taking care of personal grooming?: A Lot Help from another person toileting, which includes using toliet, bedpan, or urinal?: Total Help from another person bathing (including washing, rinsing, drying)?: Total Help from another person to put on and taking off regular upper body  clothing?: A Lot Help from another person to put on and taking off regular lower body clothing?: Total 6 Click Score: 8    End of Session Equipment Utilized During Treatment: Gait belt  OT Visit Diagnosis: Unsteadiness on feet (R26.81);Other abnormalities of gait and mobility (R26.89);Muscle weakness (generalized) (M62.81)   Activity Tolerance Patient limited by fatigue   Patient Left in chair;with call bell/phone within reach;with chair alarm set;with nursing/sitter in room   Nurse Communication Other (comment) (nurse cleared pt for therapy)        Time: 8474-8444 OT Time Calculation (min): 30 min  Charges: OT General Charges $OT Visit: 1 Visit OT Treatments $Therapeutic Activity: 8-22 mins    Delanna JINNY Lesches, OTR/L 11/24/2023, 5:22 PM

## 2023-11-24 NOTE — Progress Notes (Signed)
 PROGRESS NOTE    David Mcgrath  FMW:[id number] DOB: Nov 13, 1950 DOA: 11/12/2023 PCP: [name], DO   Brief Narrative:  73 y.o. male with medical history significant for hypertension, hyperlipidemia, paroxysmal A-fib on Eliquis , chronic anxiety/depression, polyneuropathy, multiple myeloma (diagnosed March 2025) received Bortezomib  infusion 11/09/2023. Patient was admitted here 11/02/2023 through 11/05/2023 with intractable nausea vomiting, recurrent small bowel obstruction.  He was treated conservatively with NG tube decompression and discharged home.  Last admission CT abdomen/pelvis with contrast with slight improvement in small bowel dilation when compared to prior exam but with mild transition point deep in the pelvis, no pancreatic ductal dilation, no inflammatory changes, stable free fluid within the abdomen.  General surgery was consulted and patient underwent diagnostic laparoscopy by Dr. [name] on 11/03/2023 findings of intermittently dilated small bowel with erythema of the serosa consistent with inflammation/edema, intermittent injection of the small bowel mesentery, small bowel with tendency to migrate to the left upper quadrant with long/mobile small bowel mesentery with suspicion/potential for intermittent volvulized small bowel contributing to his symptoms and findings on imaging.  Diet was slowly advanced with toleration.    This admission CT abdomen shows increasing small bowel dilatation and fecalization of bowel contents involving the jejunum. No definitive transition point is identified at this time but caliber change is noted in the right mid abdomen at the junction of the jejunum and ileum. The more distal ileum is unremarkable. Stable ground-glass opacities in the lung bases similar to that seen on prior exams.   **Interim History Has had a persistent ileus.  Unfortunately lost his NG tube on 8/21 but had to be replaced by IR.  Now connected back to LIWS and subsequently had  1900 mL out..  Undergoing a upper GI series with small bowel follow-through and this shows contrast in the stomach with delayed gastric emptying.  Subsequently there was contrast in the rectum suggesting no complete obstruction.  Assessment/Plan:   Ileus versus partial small bowel obstruction: Patient presented with nausea vomiting and diarrhea.  CT scan raise concern for ileus versus partial SBO. Patient was given symptomatic treatment.  NGT was not placed initially Patient initially started improving but then had recurrence of his nausea and vomiting.  CT scan was repeated which showed persistent small bowel dilatation with prominent jejunum as seen before. - Surgery also following, appreciate assistance and unclamped NGT - Minimal ambulation due to his weakness also -Trial of erythromycin  started on 11/18/2023 but not successful; began having large biliary emesis on 8/16 - NGT placed 8/16 -continue NPO, IVF for now -Surgery to discuss if need for further repeat laparoscopy and they do not feel like it will be beneficial -Given prolonged hospitalization with minimal to no nutrition, now warrants TPN; PICC placed 8/18 and TPN started; NGT in place and connected to LIWS but lost this AM and had to be replaced by IR and hooked back to Suction -Will be getting an UGI w/ SBFT per Surgery to assess for transit and patency and this was done and showed Contrast present in stomach with delayed gastric emptying. GI re-evaluated and they may consider endoscopy and despite this the GI physician was emphasized with the wife that they suspected motility were partially obstructing issue deep with the small bowel and do not think endoscopy will show much but without his lack of improvement extensive efforts and conservative therapies, endoscopy is not unreasonable. -KUB AM on 8/21 showed Similar pattern of gas within the stomach, small bowel and right colon. Pattern is  most consistent with either ileus or  pseudo-obstruction. -Minimize narcotics. C/w Antiemetics. GI feels it may take Days/weeks for this issue to resolve -Allergic to Reglan  so will resume Erythromycin  again (IV) and increased dose to 500 mg TID  -Repeat KUB this AM showed  Minimal bowel contrast material is demonstrated. There appears to be contrast material in the rectum suggesting no evidence of complete obstruction.Gaseous distention of small bowel with gas-filled colon most likely ileus.  Hypotension - Resolved w/ IVF Lactic acidosis - resolved  Leukocytosis, fluctuating  - large GI losses on 8/16 with emesis episode, then became hypotensive and lactic elevated - s/p fluid boluses overnight and started on maintenance fluid -Lactic now normalized 8/17; low suspicion for infection; this appears hypovolemia for now -s/p IVF -Continue trending WBC; Current Trend:  Recent Labs  Lab 11/18/23 2323 11/19/23 0642 11/20/23 0244 11/21/23 0629 11/22/23 0523 11/23/23 0407 11/24/23 0401  WBC 9.3 15.1* 14.2* 18.2* 13.8* 8.7 13.1*  -Holding off on abx at this time   Diarrhea: Reports semiformed stools.GI pathogen panel negative; Colonized with C. difficile but testing negative for active infection (antigen positive, toxin negative, PCR negative)  Acute Urinary Retention: Foley catheter was placed on 8/11.  Voiding trial once he is more stable and/or stronger   History of Multiple Myeloma: Followed by medical oncology, Dr. Federico.  Some of his symptoms are thought to be due to his chemotherapeutic medications.  PAF:  Eliquis  was currently on hold in case he needs surgical intervention and remainson Heparin  gtt. Currently not on any rate limiting medications.  Home medication list reviewed.  He is on metoprolol  but only as needed. Continue to monitor on telemetry. -C/w heparin  while awaiting potential surgery options and while off eliquis ; Continue for now given NPO  Left Fingers Discoloration:  Resolved and Improved. Noticed on  8/18 but wife states was present on 8/17 some and actually was improved on 8/18; Fingers improved 8/20. Concern was for cyanosis as fingertips were also cold. -Had PICC line placed in left arm on 8/18 however wife states discoloration was present prior; there was also some extravasation from a prior IV in the left arm as well but unclear what was transfusing as happened prior to transfer to the ICU -Left arterial Doppler ordered and shows no obstruction in the left upper extremity with triphasic waveforms; Also evaluated by vascular surgery, greatly appreciate assistance and no Surgical intervention needed. Finger discoloration has resolved as of 8/19;  Okay to discontinue neurovascular checks. Continue monitoring as necessary   Normocytic Anemia:  Hgb/Hct Trend:  Recent Labs  Lab 11/18/23 2323 11/19/23 9357 11/20/23 0244 11/21/23 0629 11/22/23 0523 11/23/23 0407 11/24/23 0401  HGB 9.7* 10.1* 11.1* 10.4* 9.7* 8.8* 9.7*  HCT 30.0* 30.5* 33.3* 32.2* 29.9* 27.6* 30.6*  MCV 98.0 93.8 95.4 95.0 94.6 95.2 95.0  -CTM for S/Sx of Bleeding; No overt bleeding noted. Stable hemoglobin noted and will need repeat CBC in the AM    Hyponatremia/Hypokalemia/Hypomagnesemia:  Recent Labs  Lab 11/18/23 2323 11/19/23 0642 11/20/23 0244 11/21/23 0629 11/22/23 0523 11/23/23 0407 11/24/23 0401  NA 137 131* 135 136 133* 136 132*  K 3.2* 4.2 3.9 4.1 3.6 4.3 4.4  MG 1.4*  --  2.1 1.8 1.9 2.0 1.9  -Potassium was supplemented.  Sodium level remains intermittently low.  There could be an element of SIADH.  Continue to monitor.  Supplement magnesium .   Hypoglycemia: Probably from poor oral intake.  Started on D5 infusion with improvement in glucose levels.  Recent Labs  Lab 11/23/23 0042 11/23/23 0536 11/23/23 1233 11/23/23 1701 11/23/23 2354 11/24/23 0733 11/24/23 1518  GLUCAP 113* 117* 120* 116* 106* 94 119*    Chronic Anxiety and Depression: Continue psychotropic medications once taking po  Properly   Physical Deconditioning: PT and OT recommendinfg SNF when medically stable for D/C  Hypoalbuminemia: Patient's Albumin  Trending down and went from 3.0 -> 2.6 -> 2.0 -> 2.1 -> 1.7 -> 1.8. CTM and Trend and repeat CMP in the AM:   Lower Extremity Swelling: In the setting of hypoalbuminemia but we will check a lower extremity duplex to rule out DVT and this is still pending   Overweight: Complicates overall prognosis and care. Estimated body mass index is 25.84 kg/m as calculated from the following:   Height as of this encounter: 6' 4 (1.93 m).   Weight as of this encounter: 96.3 kg. Weight Loss and Dietary Counseling given   DVT prophylaxis: Place and maintain sequential compression device Start: 11/16/23 1142    Code Status: Full Code Family Communication: D/w Wife @ bedside  Disposition Plan:  Level of care: Stepdown Status is: Inpatient Remains inpatient appropriate because: Needs further clinical improvement and clearance by the specialists   Consultants:  Gastroenterology Interventional radiology Vascular surgery General Surgery  Procedures:  As delineated as above   Antimicrobials:  Anti-infectives (From admission, onward)    Start     Dose/Rate Route Frequency Ordered Stop   11/24/23 1400  erythromycin  500 mg in sodium chloride  0.9 % 100 mL IVPB        500 mg 100 mL/hr over 60 Minutes Intravenous Every 8 hours 11/24/23 1027     11/23/23 2000  erythromycin  250 mg in sodium chloride  0.9 % 100 mL IVPB  Status:  Discontinued        250 mg 100 mL/hr over 60 Minutes Intravenous Every 8 hours 11/23/23 1744 11/24/23 1027   11/23/23 1200  erythromycin  (E-MYCIN ) tablet 250 mg  Status:  Discontinued        250 mg Oral 3 times daily before meals 11/23/23 1004 11/23/23 1744   11/18/23 1200  erythromycin  (E-MYCIN ) tablet 250 mg  Status:  Discontinued        250 mg Oral 2 times daily with meals 11/18/23 0720 11/19/23 0734   11/13/23 1000  acyclovir  (ZOVIRAX ) tablet 400  mg  Status:  Discontinued        400 mg Oral 2 times daily 11/13/23 0503 11/19/23 0739       Subjective: Seen and examined at bedside he is again uncomfortable.  Not really complaining of any pain.  Underwent upper GI series today.  Not really complaining of any nausea but just does not feel well.  No other concerns or complaints at this time.  Objective: Vitals:   11/24/23 1600 11/24/23 1700 11/24/23 1718 11/24/23 1800  BP: (!) 111/54 (!) 107/55  110/61  Pulse: 68 79 83 81  Resp: (!) 33 (!) 23 (!) 24 (!) 21  Temp: 97.8 F (36.6 C)     TempSrc: Oral     SpO2: 95% 100% 100% 100%  Weight:      Height:        Intake/Output Summary (Last 24 hours) at 11/24/2023 1848 Last data filed at 11/24/2023 1804 Gross per 24 hour  Intake 3394.71 ml  Output 5375 ml  Net -1980.29 ml   Filed Weights   11/21/23 0540 11/22/23 0902 11/24/23 0409  Weight: 97.8 kg 96.1 kg 96.3 kg  Examination: Physical Exam:  Constitutional: WN/WD overweight elderly Caucasian male who is chronically ill-appearing and does appear uncomfortable Respiratory: Diminished to auscultation bilaterally, no wheezing, rales, rhonchi or crackles. Normal respiratory effort and patient is not tachypenic. No accessory muscle use.  Unlabored breathing Cardiovascular: RRR, no murmurs / rubs / gallops. S1 and S2 auscultated.  Has 1+ lower extremity edema worse on the right compared to left Abdomen: Soft, non-tender, distended secondary body habitus. Bowel sounds positive.  GU: Deferred. Musculoskeletal: No clubbing / cyanosis of digits/nails. No joint deformity upper and lower extremities.  Skin: No rashes, lesions, ulcers on limited skin evaluation. No induration; Warm and dry.  Neurologic: CN 2-12 grossly intact with no focal deficits. Romberg sign and cerebellar reflexes not assessed.  Psychiatric: Normal judgment and insight. Alert and oriented x 3.  Appears depressed appearing  Data Reviewed: I have personally reviewed  following labs and imaging studies  CBC: Recent Labs  Lab 11/20/23 0244 11/21/23 0629 11/22/23 0523 11/23/23 0407 11/24/23 0401  WBC 14.2* 18.2* 13.8* 8.7 13.1*  NEUTROABS 12.8* 16.9* 12.4* 7.3 11.3*  HGB 11.1* 10.4* 9.7* 8.8* 9.7*  HCT 33.3* 32.2* 29.9* 27.6* 30.6*  MCV 95.4 95.0 94.6 95.2 95.0  PLT 261 239 209 183 195   Basic Metabolic Panel: Recent Labs  Lab 11/18/23 0607 11/18/23 2323 11/20/23 0244 11/21/23 0629 11/22/23 0523 11/23/23 0407 11/24/23 0401  NA 134*   < > 135 136 133* 136 132*  K 5.0   < > 3.9 4.1 3.6 4.3 4.4  CL 105   < > 105 107 103 103 99  CO2 23   < > 23 24 25 27 28   GLUCOSE 241*   < > 115* 127* 127* 120* 114*  BUN 13   < > 23 20 17 15 16   CREATININE 0.70   < > 1.34* 0.76 0.49* 0.47* 0.41*  CALCIUM  7.5*   < > 7.6* 7.9* 7.6* 7.6* 7.8*  MG 2.0   < > 2.1 1.8 1.9 2.0 1.9  PHOS 2.6  --   --  2.8 2.7 3.3 2.8   < > = values in this interval not displayed.   GFR: Estimated Creatinine Clearance: 102.5 mL/min (A) (by C-G formula based on SCr of 0.41 mg/dL (L)). Liver Function Tests: Recent Labs  Lab 11/23/23 0407 11/24/23 0401  AST 19 32  ALT 19 27  ALKPHOS 56 68  BILITOT 0.2 0.4  PROT 4.4* 5.1*  ALBUMIN  1.7* 1.8*   No results for input(s): LIPASE, AMYLASE in the last 168 hours. No results for input(s): AMMONIA in the last 168 hours. Coagulation Profile: No results for input(s): INR, PROTIME in the last 168 hours. Cardiac Enzymes: No results for input(s): CKTOTAL, CKMB, CKMBINDEX, TROPONINI in the last 168 hours. BNP (last 3 results) No results for input(s): PROBNP in the last 8760 hours. HbA1C: No results for input(s): HGBA1C in the last 72 hours. CBG: Recent Labs  Lab 11/23/23 1233 11/23/23 1701 11/23/23 2354 11/24/23 0733 11/24/23 1518  GLUCAP 120* 116* 106* 94 119*   Lipid Profile: No results for input(s): CHOL, HDL, LDLCALC, TRIG, CHOLHDL, LDLDIRECT in the last 72 hours. Thyroid  Function  Tests: No results for input(s): TSH, T4TOTAL, FREET4, T3FREE, THYROIDAB in the last 72 hours. Anemia Panel: No results for input(s): VITAMINB12, FOLATE, FERRITIN, TIBC, IRON, RETICCTPCT in the last 72 hours. Sepsis Labs: Recent Labs  Lab 11/18/23 2322 11/19/23 0301 11/19/23 0857  LATICACIDVEN 2.9* 3.7* 1.4    Recent Results (from the past  240 hours)  MRSA Next Gen by PCR, Nasal     Status: None   Collection Time: 11/19/23 12:11 AM   Specimen: Nasal Mucosa; Nasal Swab  Result Value Ref Range Status   MRSA by PCR Next Gen NOT DETECTED NOT DETECTED Final    Comment: (NOTE) The GeneXpert MRSA Assay (FDA approved for NASAL specimens only), is one component of a comprehensive MRSA colonization surveillance program. It is not intended to diagnose MRSA infection nor to guide or monitor treatment for MRSA infections. Test performance is not FDA approved in patients less than 67 years old. Performed at [facility], [address] [place], [place] [address]      Radiology Studies: DG Abd 1 View Result Date: 11/24/2023 CLINICAL DATA:  Intestinal pseudo-obstruction. Small-bowel follow-through protocol. 8 hour KUB. EXAM: ABDOMEN - 1 VIEW COMPARISON:  11/24/2023 FINDINGS: An enteric tube is present with tip projecting over the left upper quadrant consistent with location in the body of the stomach. Minimal bowel contrast material is demonstrated but there appears to be some contrast in the rectum which would suggest no evidence of complete obstruction. There are dilated gas distended small bowel loops with gas-filled colon. Changes likely to represent adynamic ileus. Degenerative changes in the spine and hips. IMPRESSION: 1. Minimal bowel contrast material is demonstrated. There appears to be contrast material in the rectum suggesting no evidence of complete obstruction. 2. Gaseous distention of small bowel with gas-filled colon most likely ileus.  Electronically Signed   By: Elsie Gravely M.D.   On: 11/24/2023 17:41   DG UGI W SMALL BOWEL Result Date: 11/24/2023 CLINICAL DATA:  Inpatient with NGT with concern for small bowel/GI tract dysmotility. EXAM: SMALL BOWEL FOLLOW-THROUGH SERIES FLUOROSCOPY: Radiation Exposure Index (as provided by the fluoroscopic device): 9.2 mGy Kerma TECHNIQUE: Diluted water-soluble contrast was introduced via the nasogastric tube. Subsequently, serial images were obtained. No passage of contrast from stomach to small bowel was noted within the first 3 hours of the exam despite positioning patient to encourage gastric emptying. Given that this patient's care has required placement in ICU, decision was made to return patient to the floor and continue exam with KUB images at 4 hrs, 8 hrs, and 24 hrs after contrast administration. This exam was performed by [place], NP, and was supervised and interpreted by Dr. Landy. COMPARISON:  11/23/23 KUB FINDINGS: Continued dilated bowel loops.  Delayed gastric emptying noted. IMPRESSION: Contrast present in stomach with delayed gastric emptying. Electronically Signed   By: Lynwood Landy Raddle M.D.   On: 11/24/2023 16:13   DG Abd 1 View Result Date: 11/24/2023 CLINICAL DATA:  Gastroparesis. EXAM: DG ABDOMEN 1V COMPARISON:  Same day. FINDINGS: Dilated small bowel loops are noted most prominently in the right upper quadrant concerning for ileus or partial small bowel obstruction. Nasogastric tube tip is seen in proximal stomach. No colonic dilatation is noted. There does appear to be contrast in nondilated bowel loops in the pelvis, although it is hard to determine if this is large or small bowel. IMPRESSION: Dilated small bowel loops are noted most prominently in the right upper quadrant concerning for ileus or partial small bowel obstruction. Contrast is noted in nondilated bowel loops in the pelvis, although it is hard to determine if this is large or small bowel. Electronically  Signed   By: Lynwood Landy Raddle M.D.   On: 11/24/2023 14:40   DG [place] MATSU Tube Plc W/Fl W/Rad Result Date: 11/24/2023 INDICATION: History of gastroparesis,  delayed gastric motility. Patient removed previous NG tube last night. Consulted for NG tube re-placement. EXAM: Nasogastric tube placement MEDICATIONS: Viscous lidocaine  ANESTHESIA/SEDATION: None CONTRAST:  None FLUOROSCOPY: Radiation Exposure Index (as provided by the fluoroscopic device): 6.50 mGy Kerma COMPLICATIONS: None immediate. PROCEDURE: Patient was placed supine. Viscous lidocaine  and anesthetic spray was applied to the left naris. Waited approximately 10 minutes. Viscous lidocaine  was applied to the tip of the NG tube. The NG tube was guided into the left naris with no resistance and visualized under intermittent fluoroscopy down the esophagus into the stomach. FINDINGS: NG tube visualized passing into the stomach under fluoroscopy. Patient tolerated procedure well with no complications. IMPRESSION: Successful nasogastric tube placement in the stomach under fluoroscopy guidance. Performed by: Wyatt Pommier PA-C Electronically Signed   By: Norleen DELENA Kil M.D.   On: 11/24/2023 10:19   DG Abd 1 View Result Date: 11/24/2023 CLINICAL DATA:  Short of breath EXAM: ABDOMEN - 1 VIEW COMPARISON:  11/23/2023 FINDINGS: NG tube in stomach. Side port below the GE junction. Dilated loops of small bowel not changed from 1 day prior. IMPRESSION: 1. NG tube in stomach. 2. No change in bowel gas pattern. Electronically Signed   By: Jackquline Boxer M.D.   On: 11/24/2023 09:18   DG Fluoro Rm 1-60 Min - No Report Result Date: 11/23/2023 Fluoroscopy was utilized by the requesting physician.  No radiographic interpretation.   DG Fluoro Rm 1-60 Min - No Report Result Date: 11/23/2023 Fluoroscopy was utilized by the requesting physician.  No radiographic interpretation.   DG Abd 1 View Result Date: 11/23/2023 CLINICAL DATA:  Abdominal distension EXAM: ABDOMEN - 1 VIEW  COMPARISON:  CT abdomen 11/16/2023 FINDINGS: Similar pattern of gas within the stomach, small bowel and right colon. Pattern is most consistent with either ileus or pseudo-obstruction. True obstruction is doubtful, particularly given the prior CT. IMPRESSION: Similar pattern of gas within the stomach, small bowel and right colon. Pattern is most consistent with either ileus or pseudo-obstruction. Electronically Signed   By: Oneil Officer M.D.   On: 11/23/2023 08:41   Scheduled Meds:  butamben -tetracaine -benzocaine   1 spray Topical Once   Chlorhexidine  Gluconate Cloth  6 each Topical Daily   fluticasone   1 spray Each Nare Daily   insulin  aspart  0-9 Units Subcutaneous Q8H   sodium chloride  flush  10-40 mL Intracatheter Q12H   Continuous Infusions:  dextrose  5% lactated ringers  10 mL/hr at 11/24/23 1804   erythromycin  Stopped (11/24/23 1449)   heparin  2,050 Units/hr (11/24/23 1804)   TPN ADULT (ION) 100 mL/hr at 11/24/23 1808    LOS: 11 days   Alejandro Marker, DO [facility] Available via Epic secure chat 7am-7pm After these hours, please refer to coverage provider listed on [email or web address] 11/24/2023, 6:48 PM

## 2023-11-24 NOTE — Progress Notes (Signed)
 PHARMACY - ANTICOAGULATION CONSULT NOTE  Pharmacy Consult for heparin  Indication: atrial fibrillation  Allergies  Allergen Reactions   Shrimp [Shellfish Allergy] Anaphylaxis and Swelling    Swelling throat    Reglan  [Metoclopramide ] Other (See Comments)    caused tardive dyskinesia    Patient Measurements: Height: 6' 4 (193 cm) Weight: 96.3 kg (212 lb 4.9 oz) IBW/kg (Calculated) : 86.8 HEPARIN  DW (KG): 94.6  Vital Signs: Temp: 98.5 F (36.9 C) (08/22 0734) Temp Source: Oral (08/22 0734) BP: 122/52 (08/22 1300) Pulse Rate: 80 (08/22 1300)  Labs: Recent Labs    11/22/23 0523 11/23/23 0407 11/23/23 1442 11/23/23 1654 11/24/23 0401 11/24/23 1405  HGB 9.7* 8.8*  --   --  9.7*  --   HCT 29.9* 27.6*  --   --  30.6*  --   PLT 209 183  --   --  195  --   HEPARINUNFRC 0.36 0.12*   < > <0.10* 0.17* 0.31  CREATININE 0.49* 0.47*  --   --  0.41*  --    < > = values in this interval not displayed.    Estimated Creatinine Clearance: 102.5 mL/min (A) (by C-G formula based on SCr of 0.41 mg/dL (L)).   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  Patient reports he does not have symptoms as of 02/25/14.     Diarrhea 06/04/2023   Essential hypertension 06/04/2023   Fatty liver 06/04/2023   Flu 06/04/2023   Hyperlipidemia 06/04/2023   Ileus (HCC) 06/04/2023   Multiple myeloma (HCC)    Neuropathy    Paroxysmal atrial fibrillation (HCC) 07/04/2023   Pneumonia 06/04/2023     Assessment: 73 year old male with afib on Eliquis  PTA which is currently on hold. Patient recently admitted for bowel obstruction and discharged home after conservative treatment with NGT decompression. He presented back with similar concerns, currently being treated for ileus  vs pSBO, plan to start TPN given prolonged hospitalization without nutrition. His Eliquis  was initially resumed on admission and discontinued on 8/14. Has been on prophylactic Lovenox . Pharmacy consulted for heparin  initiation and management.  Today, 11/24/23 -HL 0.31 just therapeutic on 1900 units/hr - Hgb 9.7, plts 195 -No complications of therapy noted   Goal of Therapy:  Heparin  level 0.3-0.7 units/ml Monitor platelets by anticoagulation protocol: Yes   Plan:  -Increase heparin  infusion to 2050 units/hr -Check heparin  level 8 hours after rate increase -Daily CBC, HL -Continue to follow for transition back to Eliquis  as appropriate   Dolphus Roller, PharmD, BCPS 11/24/2023 3:01 PM

## 2023-11-24 NOTE — Plan of Care (Signed)
  Problem: Clinical Measurements: °Goal: Ability to maintain clinical measurements within normal limits will improve °Outcome: Progressing °Goal: Will remain free from infection °Outcome: Progressing °Goal: Respiratory complications will improve °Outcome: Progressing °  °Problem: Nutrition: °Goal: Adequate nutrition will be maintained °Outcome: Progressing °  °

## 2023-11-24 NOTE — Progress Notes (Signed)
 Patient getting his UGI SBFT when I went to see patient.  I spoke at length in his room with patient's wife.  I advised awaiting UGI SBFT results, and Dr. Elicia (our covering physician this weekend) will review results with patient, at which point we can consider endoscopy.   I have emphasized with wife that I suspect this is a motility or partially obstructing issue deep within small bowel, and I do not think endoscopy will show much, but at this point with lack of improvement despite extensive efforts at conservative therapies, an attempt at endoscopy is not unreasonable.  Dr. Elicia will see patient tomorrow.

## 2023-11-25 ENCOUNTER — Inpatient Hospital Stay (HOSPITAL_COMMUNITY)

## 2023-11-25 DIAGNOSIS — Z4682 Encounter for fitting and adjustment of non-vascular catheter: Secondary | ICD-10-CM | POA: Diagnosis not present

## 2023-11-25 DIAGNOSIS — K567 Ileus, unspecified: Secondary | ICD-10-CM | POA: Diagnosis not present

## 2023-11-25 DIAGNOSIS — K5989 Other specified functional intestinal disorders: Secondary | ICD-10-CM | POA: Diagnosis not present

## 2023-11-25 DIAGNOSIS — E876 Hypokalemia: Secondary | ICD-10-CM | POA: Diagnosis not present

## 2023-11-25 DIAGNOSIS — R935 Abnormal findings on diagnostic imaging of other abdominal regions, including retroperitoneum: Secondary | ICD-10-CM | POA: Diagnosis not present

## 2023-11-25 DIAGNOSIS — I480 Paroxysmal atrial fibrillation: Secondary | ICD-10-CM | POA: Diagnosis not present

## 2023-11-25 DIAGNOSIS — R112 Nausea with vomiting, unspecified: Secondary | ICD-10-CM | POA: Diagnosis not present

## 2023-11-25 DIAGNOSIS — C9000 Multiple myeloma not having achieved remission: Secondary | ICD-10-CM | POA: Diagnosis not present

## 2023-11-25 LAB — CBC WITH DIFFERENTIAL/PLATELET
Basophils Absolute: 0 K/uL (ref 0.0–0.1)
Basophils Relative: 0 %
Eosinophils Absolute: 0.1 K/uL (ref 0.0–0.5)
Eosinophils Relative: 2 %
HCT: 26.1 % — ABNORMAL LOW (ref 39.0–52.0)
Hemoglobin: 8.5 g/dL — ABNORMAL LOW (ref 13.0–17.0)
Lymphocytes Relative: 11 %
Lymphs Abs: 0.8 K/uL (ref 0.7–4.0)
MCH: 30.8 pg (ref 26.0–34.0)
MCHC: 32.6 g/dL (ref 30.0–36.0)
MCV: 94.6 fL (ref 80.0–100.0)
Monocytes Absolute: 0.8 K/uL (ref 0.1–1.0)
Monocytes Relative: 11 %
Neutro Abs: 5.2 K/uL (ref 1.7–7.7)
Neutrophils Relative %: 75 %
Platelets: 186 K/uL (ref 150–400)
RBC: 2.76 MIL/uL — ABNORMAL LOW (ref 4.22–5.81)
RDW: 17.8 % — ABNORMAL HIGH (ref 11.5–15.5)
WBC: 7 K/uL (ref 4.0–10.5)
nRBC: 0 % (ref 0.0–0.2)

## 2023-11-25 LAB — GLUCOSE, CAPILLARY
Glucose-Capillary: 118 mg/dL — ABNORMAL HIGH (ref 70–99)
Glucose-Capillary: 122 mg/dL — ABNORMAL HIGH (ref 70–99)
Glucose-Capillary: 135 mg/dL — ABNORMAL HIGH (ref 70–99)

## 2023-11-25 LAB — COMPREHENSIVE METABOLIC PANEL WITH GFR
ALT: 64 U/L — ABNORMAL HIGH (ref 0–44)
AST: 73 U/L — ABNORMAL HIGH (ref 15–41)
Albumin: 1.6 g/dL — ABNORMAL LOW (ref 3.5–5.0)
Alkaline Phosphatase: 70 U/L (ref 38–126)
Anion gap: 5 (ref 5–15)
BUN: 20 mg/dL (ref 8–23)
CO2: 31 mmol/L (ref 22–32)
Calcium: 7.9 mg/dL — ABNORMAL LOW (ref 8.9–10.3)
Chloride: 99 mmol/L (ref 98–111)
Creatinine, Ser: 0.53 mg/dL — ABNORMAL LOW (ref 0.61–1.24)
GFR, Estimated: >60 mL/min (ref >60)
Glucose, Bld: 114 mg/dL — ABNORMAL HIGH (ref 70–99)
Potassium: 4.4 mmol/L (ref 3.5–5.1)
Sodium: 135 mmol/L (ref 135–145)
Total Bilirubin: 0.4 mg/dL (ref 0.0–1.2)
Total Protein: 4.8 g/dL — ABNORMAL LOW (ref 6.5–8.1)

## 2023-11-25 LAB — HEPARIN LEVEL (UNFRACTIONATED)
Heparin Unfractionated: 0.25 [IU]/mL — ABNORMAL LOW (ref 0.30–0.70)
Heparin Unfractionated: 0.35 [IU]/mL (ref 0.30–0.70)
Heparin Unfractionated: 0.38 [IU]/mL (ref 0.30–0.70)

## 2023-11-25 LAB — PHOSPHORUS: Phosphorus: 3.8 mg/dL (ref 2.5–4.6)

## 2023-11-25 LAB — MAGNESIUM: Magnesium: 2.2 mg/dL (ref 1.7–2.4)

## 2023-11-25 MED ORDER — TRAVASOL 10 % IV SOLN
INTRAVENOUS | Status: AC
  Filled 2023-11-25: qty 1320

## 2023-11-25 MED ORDER — FUROSEMIDE 10 MG/ML IJ SOLN
40.0000 mg | Freq: Once | INTRAMUSCULAR | Status: AC
  Administered 2023-11-25: 40 mg via INTRAVENOUS
  Filled 2023-11-25: qty 4

## 2023-11-25 NOTE — Progress Notes (Signed)
 PROGRESS NOTE    David Mcgrath  FMW:[id number] DOB: [birth year] DOA: 11/12/2023 PCP: [name], DO   Brief Narrative:  73 y.o. male with medical history significant for hypertension, hyperlipidemia, paroxysmal A-fib on Eliquis , chronic anxiety/depression, polyneuropathy, multiple myeloma (diagnosed March 2025) received Bortezomib  infusion 11/09/2023. Patient was admitted here 11/02/2023 through 11/05/2023 with intractable nausea vomiting, recurrent small bowel obstruction.  He was treated conservatively with NG tube decompression and discharged home.  Last admission CT abdomen/pelvis with contrast with slight improvement in small bowel dilation when compared to prior exam but with mild transition point deep in the pelvis, no pancreatic ductal dilation, no inflammatory changes, stable free fluid within the abdomen.  General surgery was consulted and patient underwent diagnostic laparoscopy by Dr. [name] on 11/03/2023 findings of intermittently dilated small bowel with erythema of the serosa consistent with inflammation/edema, intermittent injection of the small bowel mesentery, small bowel with tendency to migrate to the left upper quadrant with long/mobile small bowel mesentery with suspicion/potential for intermittent volvulized small bowel contributing to his symptoms and findings on imaging.  Diet was slowly advanced with toleration.    This admission CT abdomen shows increasing small bowel dilatation and fecalization of bowel contents involving the jejunum. No definitive transition point is identified at this time but caliber change is noted in the right mid abdomen at the junction of the jejunum and ileum. The more distal ileum is unremarkable. Stable ground-glass opacities in the lung bases similar to that seen on prior exams.   **Interim History Has had a persistent ileus. Unfortunately lost his NG tube on 8/21 but had to be replaced by IR.  Now connected back to LIWS.  Underwent a upper GI  series with small bowel follow-through and this shows contrast in the stomach with delayed gastric emptying.  Subsequently there was contrast in the rectum suggesting no complete obstruction.  GI recommending continuing erythromycin  for now and repeating abdominal x-rays and considering EGD early next week to rule out pyloric stricture and recommending ambulation.  Assessment/Plan:   Ileus versus partial small bowel obstruction: Patient presented with nausea vomiting and diarrhea.  CT scan raise concern for ileus versus partial SBO. Patient was given symptomatic treatment.  NGT was not placed initially Patient initially started improving but then had recurrence of his nausea and vomiting.  CT scan was repeated which showed persistent small bowel dilatation with prominent jejunum as seen before. - Surgery also following, appreciate assistance and unclamped NGT - Minimal ambulation due to his weakness also -Trial of erythromycin  started on 11/18/2023 but not successful; began having large biliary emesis on 8/16 - NGT placed 8/16 -continue NPO, IVF for now -Surgery to discuss if need for further repeat laparoscopy and they do not feel like it will be beneficial -Given prolonged hospitalization with minimal to no nutrition, now warrants TPN; PICC placed 8/18 and TPN started; NGT in place and connected to LIWS but lost this AM and had to be replaced by IR and hooked back to Suction -Will be getting an UGI w/ SBFT per Surgery to assess for transit and patency and this was done and showed Contrast present in stomach with delayed gastric emptying. GI re-evaluated and they may consider endoscopy and despite this the GI physician was emphasized with the wife that they suspected motility were partially obstructing issue deep with the small bowel and do not think endoscopy will show much but without his lack of improvement extensive efforts and conservative therapies, endoscopy is not unreasonable. -KUB  AM on 8/21  showed Similar pattern of gas within the stomach, small bowel and right colon. Pattern is most consistent with either ileus or pseudo-obstruction. -Minimize narcotics. C/w Antiemetics. GI feels it may take Days/weeks for this issue to resolve -Allergic to Reglan  so will resume Erythromycin  again (IV) and increased dose to 500 mg TID  -Repeat KUB this AM showed  Minimal bowel contrast material is demonstrated. There appears to be contrast material in the rectum suggesting no evidence of complete obstruction.Gaseous distention of small bowel with gas-filled colon most likely ileus. -GI pending considering EGD early next week to rule out any pyloric stricture but no obstructive lesion seen on the upper GI small bowel follow-through.  I recommended continue erythromycin  for now continue ambulation and mobilization to try and stimulate gastric intestinal motility and avoiding narcotics.  Hypotension - Resolved w/ IVF Lactic acidosis - resolved  Leukocytosis, fluctuating  - large GI losses on 8/16 with emesis episode, then became hypotensive and lactic elevated - s/p fluid boluses overnight and started on maintenance fluid -Lactic now normalized 8/17; low suspicion for infection; this appears hypovolemia for now -s/p IVF -Continue trending WBC; Current Trend Fluctuating:  Recent Labs  Lab 11/19/23 0642 11/20/23 0244 11/21/23 0629 11/22/23 0523 11/23/23 0407 11/24/23 0401 11/25/23 0523  WBC 15.1* 14.2* 18.2* 13.8* 8.7 13.1* 7.0  -Holding off on abx at this time   Diarrhea: Reports semiformed stools.GI pathogen panel negative; Colonized with C. difficile but testing negative for active infection (antigen positive, toxin negative, PCR negative). Fecal Pouch in place   Acute Urinary Retention: Foley catheter was placed on 8/11.  Voiding trial once he is more stable and/or stronger   History of Multiple Myeloma: Followed by medical oncology, Dr. Federico.  Some of his symptoms are thought to be  due to his chemotherapeutic medications.  PAF:  Eliquis  was currently on hold in case he needs surgical intervention and remainson Heparin  gtt. Currently not on any rate limiting medications.  Home medication list reviewed.  He is on metoprolol  but only as needed. Continue to monitor on telemetry. -C/w heparin  while NPO and while off eliquis ; Continue for now given NPO  Left Fingers Discoloration:  Resolved and Improved. Noticed on 8/18 but wife states was present on 8/17 some and actually was improved on 8/18; Fingers improved 8/20. Concern was for cyanosis as fingertips were also cold. -Had PICC line placed in left arm on 8/18 however wife states discoloration was present prior; there was also some extravasation from a prior IV in the left arm as well but unclear what was transfusing as happened prior to transfer to the ICU -Left arterial Doppler ordered and shows no obstruction in the left upper extremity with triphasic waveforms; Also evaluated by vascular surgery, greatly appreciate assistance and no Surgical intervention needed. Finger discoloration has resolved as of 8/19;  Okay to discontinue neurovascular checks. Continue monitoring as necessary   Normocytic Anemia:  Hgb/Hct Trend:  Recent Labs  Lab 11/19/23 0642 11/20/23 0244 11/21/23 0629 11/22/23 0523 11/23/23 0407 11/24/23 0401 11/25/23 0523  HGB 10.1* 11.1* 10.4* 9.7* 8.8* 9.7* 8.5*  HCT 30.5* 33.3* 32.2* 29.9* 27.6* 30.6* 26.1*  MCV 93.8 95.4 95.0 94.6 95.2 95.0 94.6  -CTM for S/Sx of Bleeding; No overt bleeding noted. Stable hemoglobin noted and will need repeat CBC in the AM    Hyponatremia/Hypokalemia/Hypomagnesemia:  Recent Labs  Lab 11/19/23 0642 11/20/23 0244 11/21/23 0629 11/22/23 0523 11/23/23 0407 11/24/23 0401 11/25/23 0523  NA 131* 135 136  133* 136 132* 135  K 4.2 3.9 4.1 3.6 4.3 4.4 4.4  MG  --  2.1 1.8 1.9 2.0 1.9 2.2  -Potassium was supplemented.  Sodium level remains intermittently low.  There could  be an element of SIADH.  Continue to monitor.  Supplement magnesium .   Hypoglycemia: Probably from poor oral intake. On D5 infusion in LR @ 10 mL/hr with improvement in glucose levels. Current CBG Trend ranging from 94-122  Abnormal LFTs: IN the setting of TPN. AST is now 73 and ALT is now 64. CTM and Trend and if Necessary will obtain RUQ U/S and Acute Hepatitis Panel   Chronic Anxiety and Depression: Continue psychotropic medications once taking po Properly   Physical Deconditioning: PT and OT recommendinfg SNF when medically stable for D/C but not medically ready yet.  Hypoalbuminemia: Patient's Albumin  Trending down and went from 3.0 -> 1.6. CTM and Trend and repeat CMP in the AM:   Lower Extremity Swelling: In the setting of hypoalbuminemia but we will check a lower extremity duplex to rule out DVT and this is still pending   Overweight: Complicates overall prognosis and care. Estimated body mass index is 25.84 kg/m as calculated from the following:   Height as of this encounter: 6' 4 (1.93 m).   Weight as of this encounter: 96.3 kg. Weight Loss and Dietary Counseling given   DVT prophylaxis: Place and maintain sequential compression device Start: 11/16/23 1142    Code Status: Full Code Family Communication: Discussed with wife at bedside  Disposition Plan:  Level of care: Stepdown Status is: Inpatient Remains inpatient appropriate because: Need to have improvement in his ileus and gastric dysmotility and will need to tolerate a diet and go to SNF once medically stable   Consultants:  Gastroenterology Interventional radiology Vascular surgery General Surgery  Procedures:  As delineated as above  Antimicrobials:  Anti-infectives (From admission, onward)    Start     Dose/Rate Route Frequency Ordered Stop   11/24/23 1400  erythromycin  500 mg in sodium chloride  0.9 % 100 mL IVPB        500 mg 100 mL/hr over 60 Minutes Intravenous Every 8 hours 11/24/23 1027      11/23/23 2000  erythromycin  250 mg in sodium chloride  0.9 % 100 mL IVPB  Status:  Discontinued        250 mg 100 mL/hr over 60 Minutes Intravenous Every 8 hours 11/23/23 1744 11/24/23 1027   11/23/23 1200  erythromycin  (E-MYCIN ) tablet 250 mg  Status:  Discontinued        250 mg Oral 3 times daily before meals 11/23/23 1004 11/23/23 1744   11/18/23 1200  erythromycin  (E-MYCIN ) tablet 250 mg  Status:  Discontinued        250 mg Oral 2 times daily with meals 11/18/23 0720 11/19/23 0734   11/13/23 1000  acyclovir  (ZOVIRAX ) tablet 400 mg  Status:  Discontinued        400 mg Oral 2 times daily 11/13/23 0503 11/19/23 0739       Subjective: And examined at bedside and continues to complain of being uncomfortable.  Denies any nausea or abdominal pain.  States that he did not sleep very well.  Very fatigued.  Frustrated.  No other concerns or complaints this time.  Objective: Vitals:   11/25/23 1200 11/25/23 1244 11/25/23 1500 11/25/23 1600  BP: 126/71  132/77 117/83  Pulse: 82  81 84  Resp: (!) 28  (!) 39 (!) 31  Temp:  98 F (36.7 C)    TempSrc:  Oral    SpO2: 99%  99% 100%  Weight:      Height:        Intake/Output Summary (Last 24 hours) at 11/25/2023 1848 Last data filed at 11/25/2023 1800 Gross per 24 hour  Intake 3636.98 ml  Output 3275 ml  Net 361.98 ml   Filed Weights   11/22/23 0902 11/24/23 0409 11/25/23 0500  Weight: 96.1 kg 96.3 kg 96.3 kg   Examination: Physical Exam:  Constitutional: WN/WD, overweight elderly Caucasian male is chronically ill-appearing and appears uncomfortable Respiratory: Diminished to auscultation bilaterally, no wheezing, rales, rhonchi or crackles. Normal respiratory effort and patient is not tachypenic. No accessory muscle use.  Unlabored breathing Cardiovascular: RRR, no murmurs / rubs / gallops. S1 and S2 auscultated.  1+ extremity edema worse on the right compared to left Abdomen: Soft, non-tender, distended secondary body habitus. Bowel  sounds diminished GU: Deferred. Musculoskeletal: No clubbing / cyanosis of digits/nails. No joint deformity upper and lower extremities. Skin: No rashes, lesions, ulcers on a limited skin evaluation. No induration; Warm and dry.  Neurologic: CN 2-12 grossly intact with no focal deficits. Romberg sign and cerebellar reflexes not assessed.  Psychiatric: Normal judgment and insight. Alert and oriented x 3. Normal mood and appropriate affect.   Data Reviewed: I have personally reviewed following labs and imaging studies  CBC: Recent Labs  Lab 11/21/23 0629 11/22/23 0523 11/23/23 0407 11/24/23 0401 11/25/23 0523  WBC 18.2* 13.8* 8.7 13.1* 7.0  NEUTROABS 16.9* 12.4* 7.3 11.3* 5.2  HGB 10.4* 9.7* 8.8* 9.7* 8.5*  HCT 32.2* 29.9* 27.6* 30.6* 26.1*  MCV 95.0 94.6 95.2 95.0 94.6  PLT 239 209 183 195 186   Basic Metabolic Panel: Recent Labs  Lab 11/21/23 0629 11/22/23 0523 11/23/23 0407 11/24/23 0401 11/25/23 0523  NA 136 133* 136 132* 135  K 4.1 3.6 4.3 4.4 4.4  CL 107 103 103 99 99  CO2 24 25 27 28 31   GLUCOSE 127* 127* 120* 114* 114*  BUN 20 17 15 16 20   CREATININE 0.76 0.49* 0.47* 0.41* 0.53*  CALCIUM  7.9* 7.6* 7.6* 7.8* 7.9*  MG 1.8 1.9 2.0 1.9 2.2  PHOS 2.8 2.7 3.3 2.8 3.8   GFR: Estimated Creatinine Clearance: 102.5 mL/min (A) (by C-G formula based on SCr of 0.53 mg/dL (L)). Liver Function Tests: Recent Labs  Lab 11/23/23 0407 11/24/23 0401 11/25/23 0523  AST 19 32 73*  ALT 19 27 64*  ALKPHOS 56 68 70  BILITOT 0.2 0.4 0.4  PROT 4.4* 5.1* 4.8*  ALBUMIN  1.7* 1.8* 1.6*   No results for input(s): LIPASE, AMYLASE in the last 168 hours. No results for input(s): AMMONIA in the last 168 hours. Coagulation Profile: No results for input(s): INR, PROTIME in the last 168 hours. Cardiac Enzymes: No results for input(s): CKTOTAL, CKMB, CKMBINDEX, TROPONINI in the last 168 hours. BNP (last 3 results) No results for input(s): PROBNP in the last 8760  hours. HbA1C: No results for input(s): HGBA1C in the last 72 hours. CBG: Recent Labs  Lab 11/24/23 0733 11/24/23 1518 11/24/23 2356 11/25/23 0808 11/25/23 1628  GLUCAP 94 119* 119* 118* 122*   Lipid Profile: No results for input(s): CHOL, HDL, LDLCALC, TRIG, CHOLHDL, LDLDIRECT in the last 72 hours. Thyroid  Function Tests: No results for input(s): TSH, T4TOTAL, FREET4, T3FREE, THYROIDAB in the last 72 hours. Anemia Panel: No results for input(s): VITAMINB12, FOLATE, FERRITIN, TIBC, IRON, RETICCTPCT in the last 72 hours. Sepsis  Labs: Recent Labs  Lab 11/18/23 2322 11/19/23 0301 11/19/23 0857  LATICACIDVEN 2.9* 3.7* 1.4    Recent Results (from the past 240 hours)  MRSA Next Gen by PCR, Nasal     Status: None   Collection Time: 11/19/23 12:11 AM   Specimen: Nasal Mucosa; Nasal Swab  Result Value Ref Range Status   MRSA by PCR Next Gen NOT DETECTED NOT DETECTED Final    Comment: (NOTE) The GeneXpert MRSA Assay (FDA approved for NASAL specimens only), is one component of a comprehensive MRSA colonization surveillance program. It is not intended to diagnose MRSA infection nor to guide or monitor treatment for MRSA infections. Test performance is not FDA approved in patients less than 67 years old. Performed at [facility], [address], [place], [place] [address]     Radiology Studies: DG Abd 1 View Result Date: 11/25/2023 CLINICAL DATA:  Intestinal pseudo-obstruction. Small-bowel follow-through protocol. 24 hour delay. EXAM: ABDOMEN - 1 VIEW COMPARISON:  11/24/2023. FINDINGS: Enteric tube with tip and side port in the gastric body. Minimal bowel contrast material is again noted along the left abdomen. The previously noted contrast material in the rectum is not included within the field of view of this exam. Persistent gaseous dilation of small bowel in the central and right abdomen and gas-filled left colon. IMPRESSION:  Persistent gaseous dilation of small bowel in the central and right abdomen and gas-filled left colon, suggestive of ileus. Minimal bowel contrast material again seen along the left abdomen, although the previously noted contrast material in the rectum is not included within the field of view of this exam. Electronically Signed   By: Harrietta Sherry M.D.   On: 11/25/2023 10:50   DG Abd 1 View Result Date: 11/24/2023 CLINICAL DATA:  Intestinal pseudo-obstruction. Small-bowel follow-through protocol. 8 hour KUB. EXAM: ABDOMEN - 1 VIEW COMPARISON:  11/24/2023 FINDINGS: An enteric tube is present with tip projecting over the left upper quadrant consistent with location in the body of the stomach. Minimal bowel contrast material is demonstrated but there appears to be some contrast in the rectum which would suggest no evidence of complete obstruction. There are dilated gas distended small bowel loops with gas-filled colon. Changes likely to represent adynamic ileus. Degenerative changes in the spine and hips. IMPRESSION: 1. Minimal bowel contrast material is demonstrated. There appears to be contrast material in the rectum suggesting no evidence of complete obstruction. 2. Gaseous distention of small bowel with gas-filled colon most likely ileus. Electronically Signed   By: Elsie Gravely M.D.   On: 11/24/2023 17:41   DG UGI W SMALL BOWEL Result Date: 11/24/2023 CLINICAL DATA:  Inpatient with NGT with concern for small bowel/GI tract dysmotility. EXAM: SMALL BOWEL FOLLOW-THROUGH SERIES FLUOROSCOPY: Radiation Exposure Index (as provided by the fluoroscopic device): 9.2 mGy Kerma TECHNIQUE: Diluted water-soluble contrast was introduced via the nasogastric tube. Subsequently, serial images were obtained. No passage of contrast from stomach to small bowel was noted within the first 3 hours of the exam despite positioning patient to encourage gastric emptying. Given that this patient's care has required placement in  ICU, decision was made to return patient to the floor and continue exam with KUB images at 4 hrs, 8 hrs, and 24 hrs after contrast administration. This exam was performed by [place], NP, and was supervised and interpreted by Dr. Landy. COMPARISON:  11/23/23 KUB FINDINGS: Continued dilated bowel loops.  Delayed gastric emptying noted. IMPRESSION: Contrast present in stomach with delayed gastric emptying.  Electronically Signed   By: Lynwood Landy Raddle M.D.   On: 11/24/2023 16:13   DG Abd 1 View Result Date: 11/24/2023 CLINICAL DATA:  Gastroparesis. EXAM: DG ABDOMEN 1V COMPARISON:  Same day. FINDINGS: Dilated small bowel loops are noted most prominently in the right upper quadrant concerning for ileus or partial small bowel obstruction. Nasogastric tube tip is seen in proximal stomach. No colonic dilatation is noted. There does appear to be contrast in nondilated bowel loops in the pelvis, although it is hard to determine if this is large or small bowel. IMPRESSION: Dilated small bowel loops are noted most prominently in the right upper quadrant concerning for ileus or partial small bowel obstruction. Contrast is noted in nondilated bowel loops in the pelvis, although it is hard to determine if this is large or small bowel. Electronically Signed   By: Lynwood Landy Raddle M.D.   On: 11/24/2023 14:40   DG Abd 1 View Result Date: 11/24/2023 CLINICAL DATA:  Short of breath EXAM: ABDOMEN - 1 VIEW COMPARISON:  11/23/2023 FINDINGS: NG tube in stomach. Side port below the GE junction. Dilated loops of small bowel not changed from 1 day prior. IMPRESSION: 1. NG tube in stomach. 2. No change in bowel gas pattern. Electronically Signed   By: Jackquline Boxer M.D.   On: 11/24/2023 09:18   Scheduled Meds:  butamben -tetracaine -benzocaine   1 spray Topical Once   Chlorhexidine  Gluconate Cloth  6 each Topical Daily   fluticasone   1 spray Each Nare Daily   insulin  aspart  0-9 Units Subcutaneous Q8H   sodium chloride   flush  10-40 mL Intracatheter Q12H   Continuous Infusions:  dextrose  5% lactated ringers  10 mL/hr at 11/25/23 1800   erythromycin  Stopped (11/25/23 1559)   heparin  2,150 Units/hr (11/25/23 1800)   TPN ADULT (ION) 100 mL/hr at 11/25/23 1800    LOS: 12 days   Alejandro Marker, DO [facility] Available via Epic secure chat 7am-7pm After these hours, please refer to coverage provider listed on [email or web address] 11/25/2023, 6:48 PM

## 2023-11-25 NOTE — Plan of Care (Signed)
  Problem: Clinical Measurements: Goal: Ability to maintain clinical measurements within normal limits will improve Outcome: Progressing Goal: Will remain free from infection Outcome: Progressing Goal: Diagnostic test results will improve Outcome: Progressing Goal: Cardiovascular complication will be avoided Outcome: Progressing   Problem: Nutrition: Goal: Adequate nutrition will be maintained Outcome: Progressing   Problem: Elimination: Goal: Will not experience complications related to bowel motility Outcome: Progressing Goal: Will not experience complications related to urinary retention Outcome: Progressing

## 2023-11-25 NOTE — Progress Notes (Signed)
 PHARMACY - ANTICOAGULATION CONSULT NOTE  Pharmacy Consult for heparin  Indication: atrial fibrillation  Allergies  Allergen Reactions   Shrimp [Shellfish Allergy] Anaphylaxis and Swelling    Swelling throat    Reglan  [Metoclopramide ] Other (See Comments)    caused tardive dyskinesia    Patient Measurements: Height: 6' 4 (193 cm) Weight: 96.3 kg (212 lb 4.9 oz) IBW/kg (Calculated) : 86.8 HEPARIN  DW (KG): 94.6  Vital Signs: Temp: 97.7 F (36.5 C) (08/23 0000) Temp Source: Oral (08/23 0000) BP: 104/57 (08/23 0000) Pulse Rate: 81 (08/23 0000)  Labs: Recent Labs    11/22/23 0523 11/23/23 0407 11/23/23 1442 11/24/23 0401 11/24/23 1405 11/24/23 2352  HGB 9.7* 8.8*  --  9.7*  --   --   HCT 29.9* 27.6*  --  30.6*  --   --   PLT 209 183  --  195  --   --   HEPARINUNFRC 0.36 0.12*   < > 0.17* 0.31 0.35  CREATININE 0.49* 0.47*  --  0.41*  --   --    < > = values in this interval not displayed.    Estimated Creatinine Clearance: 102.5 mL/min (A) (by C-G formula based on SCr of 0.41 mg/dL (L)).   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  Patient reports he does not have symptoms as of 02/25/14.     Diarrhea 06/04/2023   Essential hypertension 06/04/2023   Fatty liver 06/04/2023   Flu 06/04/2023   Hyperlipidemia 06/04/2023   Ileus (HCC) 06/04/2023   Multiple myeloma (HCC)    Neuropathy    Paroxysmal atrial fibrillation (HCC) 07/04/2023   Pneumonia 06/04/2023     Assessment: 73 year old male with afib on Eliquis  PTA which is currently on hold. Patient recently admitted for bowel obstruction and discharged home after conservative treatment with NGT decompression. He presented back with similar concerns, currently being treated for ileus vs  pSBO, plan to start TPN given prolonged hospitalization without nutrition. His Eliquis  was initially resumed on admission and discontinued on 8/14. Has been on prophylactic Lovenox . Pharmacy consulted for heparin  initiation and management.  Today, 11/25/23 -HL 0.31 just therapeutic on 1900 units/hr - Hgb 9.7, plts 195 -No complications of therapy noted  2352 HL 0.35 therapeutic on 2050 units/hr  Goal of Therapy:  Heparin  level 0.3-0.7 units/ml Monitor platelets by anticoagulation protocol: Yes   Plan:  -Continue heparin  infusion at 2050 units/hr -Daily CBC, HL -Continue to follow for transition back to Eliquis  as appropriate   Leeroy Mace RPh 11/25/2023, 12:31 AM

## 2023-11-25 NOTE — Progress Notes (Signed)
 PHARMACY - ANTICOAGULATION CONSULT NOTE  Pharmacy Consult for heparin  Indication: atrial fibrillation  Allergies  Allergen Reactions   Shrimp [Shellfish Allergy] Anaphylaxis and Swelling    Swelling throat    Reglan  [Metoclopramide ] Other (See Comments)    caused tardive dyskinesia    Patient Measurements: Height: 6' 4 (193 cm) Weight: 96.3 kg (212 lb 4.9 oz) IBW/kg (Calculated) : 86.8 HEPARIN  DW (KG): 94.6  Vital Signs: Temp: 98 F (36.7 C) (08/23 1244) Temp Source: Oral (08/23 1244) BP: 117/83 (08/23 1600) Pulse Rate: 84 (08/23 1600)  Labs: Recent Labs    11/23/23 0407 11/23/23 1442 11/24/23 0401 11/24/23 1405 11/24/23 2352 11/25/23 0523  HGB 8.8*  --  9.7*  --   --  8.5*  HCT 27.6*  --  30.6*  --   --  26.1*  PLT 183  --  195  --   --  186  HEPARINUNFRC 0.12*   < > 0.17* 0.31 0.35 0.25*  CREATININE 0.47*  --  0.41*  --   --  0.53*   < > = values in this interval not displayed.    Estimated Creatinine Clearance: 102.5 mL/min (A) (by C-G formula based on SCr of 0.53 mg/dL (L)).   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  Patient reports he does not have symptoms as of 02/25/14.     Diarrhea 06/04/2023   Essential hypertension 06/04/2023   Fatty liver 06/04/2023   Flu 06/04/2023   Hyperlipidemia 06/04/2023   Ileus (HCC) 06/04/2023   Multiple myeloma (HCC)    Neuropathy    Paroxysmal atrial fibrillation (HCC) 07/04/2023   Pneumonia 06/04/2023     Assessment: 73 year old male with afib on Eliquis  PTA which is currently on hold. Patient recently admitted for bowel obstruction and discharged home after conservative treatment with NGT decompression. He presented back with similar concerns, currently being treated for ileus vs  pSBO, plan to start TPN given prolonged hospitalization without nutrition. His Eliquis  was initially resumed on admission and discontinued on 8/14. Has been on prophylactic Lovenox . Pharmacy consulted for heparin  initiation and management.  11/25/23 evening  -HL 0.38 - therapeutic (low end of range) on 2150 units/hr -No bleeding noted   Goal of Therapy:  Heparin  level 0.3-0.7 units/ml Monitor platelets by anticoagulation protocol: Yes   Plan:  -Continue heparin  infusion at 2150 units/hr -Daily CBC, HL -Continue to follow for transition back to Eliquis  as appropriate  Thank you for allowing pharmacy to be a part of this patient's care.  Marget Hench, PharmD Clinical Pharmacist 11/25/2023 6:10 PM

## 2023-11-25 NOTE — Progress Notes (Signed)
 PHARMACY - TOTAL PARENTERAL NUTRITION CONSULT NOTE   Indication: Bowel Obstruction  Patient Measurements: Height: 6' 4 (193 cm) Weight: 96.3 kg (212 lb 4.9 oz) IBW/kg (Calculated) : 86.8 TPN AdjBW (KG): 94.6 Body mass index is 25.84 kg/m. Usual Weight:   Assessment:  Pharmacy is consulted to start TPN on 73 yo male diagnosed with bowel obstruction. This admission CT abdomen shows increasing small bowel dilatation and fecalization of bowel contents involving the jejunum. No definitive transition point is identified at this time but caliber change is noted in the right mid abdomen at the junction of the jejunum and ileum. The more distal ileum is unremarkable. Some suspicion that bowel obstruction may be related to bortezomib  treatment pt receiving for multiple myeloma.   Glucose / Insulin : no hx of DM - CBG 94-119  - 0 unit of insulin  given  Electrolytes:  - Electrolytes are WNL including CorrCa at 9.8 Renal: SCr < 1, BUN WNL Hepatic: LFTs slightly increased, alk phos, bilirubin WNL.  Albumin  low at 1.6 Intake / Output; MIVF:  - Output: stool uncharted, NG 2825 mL - Intake: D5LR at KVO, no enteral intake GI Imaging: -  Admitted from 10/14/23-10/20/23 for symptoms related to small bowel obstruction (transition point suspected in the right hemi-abdomen on CT imaging) and received non-operative management.  - Admitted 11/02/23 - 11/05/2023 for small bowel dilatation/concern for small bowel obstruction. CT imaging showed persistent small bowel dilatation with mild transition point noted deep in pelvis. He went to OR 11/03/23, findings were suspicious for intermittent volvulus of small bowel to explain his symptoms and CT imaging.  - 8/10  CT abdomen shows increasing small bowel dilatation and fecalization of bowel contents involving the jejunum. No definitive transition point is identified at this time but caliber change is noted in the right mid abdomen at the junction of the jejunum and ileum.   GI Surgeries / Procedures:  8/1: to OR 11/03/23, findings were suspicious for intermittent volvulus of small bowel to explain his symptoms and CT imaging.  Central access:  PICC 8/18  Nutritional Goals: Goal TPN rate is 100  mL/hr (provides 132 g of protein and 2438 kcals per day)  RD Assessment:  Estimated Needs Total Energy Estimated Needs: 2400-2600 kcals Total Protein Estimated Needs: 120-135 grams Total Fluid Estimated Needs: >/= 2.4L  Current Nutrition:  NPO  Plan:  Continue TPN at 100 mL/hr, target rate  Electrolytes in TPN: (no changes) Inc Na to 100 mEq/L  K 60 mEq/L  Ca 5 mEq/L Mg 10 mEq/L Phos 20 mmol/L.  Cl:Ac 1:1 Add standard MVI and trace elements to TPN Continue q8h Sensitive SSI and adjust as needed. Will consider stopping in next 24 hours if CBGs remain controlled  Thiamine  100 mg IV daily x 5 days completed on  8/22  MIVF at  [facility] mL/hr   Monitor TPN labs on Mon/Thurs.  Bmet/mag/phos with AM labs.   Dolphus Roller, PharmD, BCPS 11/25/2023 9:50 AM

## 2023-11-25 NOTE — Progress Notes (Signed)
 [facility] Progress Note  Jaikob Borgwardt III 73 y.o. 1950-07-14  CC: Nausea, vomiting, generalized ileus   Subjective: Patient seen and examined at bedside.  No acute issues overnight.  ROS : Resting comfortably.  Some distress with NG tube   Objective: Vital signs in last 24 hours: Vitals:   11/25/23 0843 11/25/23 0900  BP:  (!) 111/54  Pulse:  79  Resp:  (!) 27  Temp: 97.8 F (36.6 C)   SpO2:  98%    Physical Exam: Elderly appearing patient, no acute distress.  Abdomen is soft, hypoactive bowel sounds.  No peritoneal signs.  Mood and affect normal.  Lab Results: Recent Labs    11/24/23 0401 11/25/23 0523  NA 132* 135  K 4.4 4.4  CL 99 99  CO2 28 31  GLUCOSE 114* 114*  BUN 16 20  CREATININE 0.41* 0.53*  CALCIUM  7.8* 7.9*  MG 1.9 2.2  PHOS 2.8 3.8   Recent Labs    11/24/23 0401 11/25/23 0523  AST 32 73*  ALT 27 64*  ALKPHOS 68 70  BILITOT 0.4 0.4  PROT 5.1* 4.8*  ALBUMIN  1.8* 1.6*   Recent Labs    11/24/23 0401 11/25/23 0523  WBC 13.1* 7.0  NEUTROABS 11.3* PENDING  HGB 9.7* 8.5*  HCT 30.6* 26.1*  MCV 95.0 94.6  PLT 195 186   No results for input(s): LABPROT, INR in the last 72 hours.    Assessment/Plan: -Nausea and vomiting likely from generalized dysmotility and ileus.  Upper GI small bowel follow-through yesterday showed contrast retained in the stomach for 3 hours and exam was subsequently terminated.  Patient subsequently had repeat x-rays done which showed contrast material up to the rectum excluding complete obstruction. - Chronic anemia - Paroxysmal atrial fibrillation.  Currently on heparin  drip.  Eliquis  on hold. - Mildly elevated LFTs.  Likely transient  Recommendations ------------------------- - Patient is allergic to Reglan .  Continue erythromycin  for now - Repeat abdominal x-ray in the morning - May consider EGD early next week to rule out any pyloric stricture but no obstructive lesion seen during upper  GI small bowel follow-through. - Recommend ambulate with assistance of physical therapy to stimulate gastric and intestinal motility -Avoid narcotics - Continue TPN for now - GI will follow.  Management options discussed with hospitalist and family at bedside.  Layla Lah MD, FACP 11/25/2023, 9:35 AM  Contact #  [phone]

## 2023-11-25 NOTE — TOC Progression Note (Signed)
 Transition of Care [facility]) - Progression Note    Patient Details  Name: David Mcgrath MRN: [id number] Date of Birth: [birth year]  Transition of Care [facility]) CM/SW Contact  Sonda Manuella Quill, RN Phone Number: 11/25/2023, 10:52 AM  Clinical Narrative:    Pt remains on TPN; no medically ready for d/c; TOC is following.                     Expected Discharge Plan and Services                                               Social Drivers of Health (SDOH) Interventions SDOH Screenings   Food Insecurity: No Food Insecurity (11/13/2023)  Housing: Low Risk  (11/13/2023)  Transportation Needs: No Transportation Needs (11/13/2023)  Utilities: Not At Risk (11/13/2023)  Depression (PHQ2-9): Low Risk  (11/09/2023)  Social Connections: Socially Integrated (11/13/2023)  Tobacco Use: Low Risk  (11/12/2023)    Readmission Risk Interventions    11/05/2023   10:31 AM 10/15/2023   11:45 AM  Readmission Risk Prevention Plan  Transportation Screening Complete Complete  PCP or Specialist Appt within 3-5 Days Complete Complete  HRI or Home Care Consult Complete Complete  Social Work Consult for Recovery Care Planning/Counseling Complete Complete  Palliative Care Screening Not Applicable Not Applicable  Medication Review Oceanographer) Complete Complete

## 2023-11-25 NOTE — Progress Notes (Signed)
 PHARMACY - ANTICOAGULATION CONSULT NOTE  Pharmacy Consult for heparin  Indication: atrial fibrillation  Allergies  Allergen Reactions   Shrimp [Shellfish Allergy] Anaphylaxis and Swelling    Swelling throat    Reglan  [Metoclopramide ] Other (See Comments)    caused tardive dyskinesia    Patient Measurements: Height: 6' 4 (193 cm) Weight: 96.3 kg (212 lb 4.9 oz) IBW/kg (Calculated) : 86.8 HEPARIN  DW (KG): 94.6  Vital Signs: Temp: 97.8 F (36.6 C) (08/23 0843) Temp Source: Oral (08/23 0843) BP: 111/54 (08/23 0900) Pulse Rate: 79 (08/23 0900)  Labs: Recent Labs    11/23/23 0407 11/23/23 1442 11/24/23 0401 11/24/23 1405 11/24/23 2352 11/25/23 0523  HGB 8.8*  --  9.7*  --   --  8.5*  HCT 27.6*  --  30.6*  --   --  26.1*  PLT 183  --  195  --   --  186  HEPARINUNFRC 0.12*   < > 0.17* 0.31 0.35 0.25*  CREATININE 0.47*  --  0.41*  --   --  0.53*   < > = values in this interval not displayed.    Estimated Creatinine Clearance: 102.5 mL/min (A) (by C-G formula based on SCr of 0.53 mg/dL (L)).   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  Patient reports he does not have symptoms as of 02/25/14.     Diarrhea 06/04/2023   Essential hypertension 06/04/2023   Fatty liver 06/04/2023   Flu 06/04/2023   Hyperlipidemia 06/04/2023   Ileus (HCC) 06/04/2023   Multiple myeloma (HCC)    Neuropathy    Paroxysmal atrial fibrillation (HCC) 07/04/2023   Pneumonia 06/04/2023     Assessment: 73 year old male with afib on Eliquis  PTA which is currently on hold. Patient recently admitted for bowel obstruction and discharged home after conservative treatment with NGT decompression. He presented back with similar concerns, currently being treated for ileus vs  pSBO, plan to start TPN given prolonged hospitalization without nutrition. His Eliquis  was initially resumed on admission and discontinued on 8/14. Has been on prophylactic Lovenox . Pharmacy consulted for heparin  initiation and management.  Today, 11/25/23 -HL 0.25 subtherapeutic on 2050 units/hr - Hgb 8.5, plt 186  -No line or bleeding issues with heparin  drip per RN    Goal of Therapy:  Heparin  level 0.3-0.7 units/ml Monitor platelets by anticoagulation protocol: Yes   Plan:  -Increase heparin  infusion to 2150 units/hr -Check heparin  level 8 hours after rate increase -Daily CBC, HL -Continue to follow for transition back to Eliquis  as appropriate   Dolphus Roller, PharmD, BCPS 11/25/2023 9:47 AM

## 2023-11-26 ENCOUNTER — Inpatient Hospital Stay (HOSPITAL_COMMUNITY)

## 2023-11-26 DIAGNOSIS — M7989 Other specified soft tissue disorders: Secondary | ICD-10-CM

## 2023-11-26 DIAGNOSIS — F32A Depression, unspecified: Secondary | ICD-10-CM

## 2023-11-26 DIAGNOSIS — R140 Abdominal distension (gaseous): Secondary | ICD-10-CM | POA: Diagnosis not present

## 2023-11-26 DIAGNOSIS — Z4682 Encounter for fitting and adjustment of non-vascular catheter: Secondary | ICD-10-CM | POA: Diagnosis not present

## 2023-11-26 DIAGNOSIS — E876 Hypokalemia: Secondary | ICD-10-CM | POA: Diagnosis not present

## 2023-11-26 DIAGNOSIS — R112 Nausea with vomiting, unspecified: Secondary | ICD-10-CM | POA: Diagnosis not present

## 2023-11-26 DIAGNOSIS — K6389 Other specified diseases of intestine: Secondary | ICD-10-CM | POA: Diagnosis not present

## 2023-11-26 DIAGNOSIS — C9000 Multiple myeloma not having achieved remission: Secondary | ICD-10-CM | POA: Diagnosis not present

## 2023-11-26 DIAGNOSIS — I480 Paroxysmal atrial fibrillation: Secondary | ICD-10-CM | POA: Diagnosis not present

## 2023-11-26 DIAGNOSIS — K567 Ileus, unspecified: Secondary | ICD-10-CM | POA: Diagnosis not present

## 2023-11-26 DIAGNOSIS — R935 Abnormal findings on diagnostic imaging of other abdominal regions, including retroperitoneum: Secondary | ICD-10-CM | POA: Diagnosis not present

## 2023-11-26 LAB — COMPREHENSIVE METABOLIC PANEL WITH GFR
ALT: 114 U/L — ABNORMAL HIGH (ref 0–44)
AST: 73 U/L — ABNORMAL HIGH (ref 15–41)
Albumin: 1.8 g/dL — ABNORMAL LOW (ref 3.5–5.0)
Alkaline Phosphatase: 80 U/L (ref 38–126)
Anion gap: 9 (ref 5–15)
BUN: 23 mg/dL (ref 8–23)
CO2: 33 mmol/L — ABNORMAL HIGH (ref 22–32)
Calcium: 8.1 mg/dL — ABNORMAL LOW (ref 8.9–10.3)
Chloride: 96 mmol/L — ABNORMAL LOW (ref 98–111)
Creatinine, Ser: 0.57 mg/dL — ABNORMAL LOW (ref 0.61–1.24)
GFR, Estimated: >60 mL/min (ref >60)
Glucose, Bld: 127 mg/dL — ABNORMAL HIGH (ref 70–99)
Potassium: 3.8 mmol/L (ref 3.5–5.1)
Sodium: 138 mmol/L (ref 135–145)
Total Bilirubin: 0.4 mg/dL (ref 0.0–1.2)
Total Protein: 5.1 g/dL — ABNORMAL LOW (ref 6.5–8.1)

## 2023-11-26 LAB — CBC WITH DIFFERENTIAL/PLATELET
Abs Immature Granulocytes: 0.12 K/uL — ABNORMAL HIGH (ref 0.00–0.07)
Basophils Absolute: 0.1 K/uL (ref 0.0–0.1)
Basophils Relative: 1 %
Eosinophils Absolute: 0.2 K/uL (ref 0.0–0.5)
Eosinophils Relative: 3 %
HCT: 26.1 % — ABNORMAL LOW (ref 39.0–52.0)
Hemoglobin: 8.5 g/dL — ABNORMAL LOW (ref 13.0–17.0)
Immature Granulocytes: 2 %
Lymphocytes Relative: 11 %
Lymphs Abs: 0.6 K/uL — ABNORMAL LOW (ref 0.7–4.0)
MCH: 30.8 pg (ref 26.0–34.0)
MCHC: 32.6 g/dL (ref 30.0–36.0)
MCV: 94.6 fL (ref 80.0–100.0)
Monocytes Absolute: 0.6 K/uL (ref 0.1–1.0)
Monocytes Relative: 11 %
Neutro Abs: 4.3 K/uL (ref 1.7–7.7)
Neutrophils Relative %: 72 %
Platelets: 204 K/uL (ref 150–400)
RBC: 2.76 MIL/uL — ABNORMAL LOW (ref 4.22–5.81)
RDW: 17.9 % — ABNORMAL HIGH (ref 11.5–15.5)
WBC: 5.9 K/uL (ref 4.0–10.5)
nRBC: 0 % (ref 0.0–0.2)

## 2023-11-26 LAB — GLUCOSE, CAPILLARY
Glucose-Capillary: 114 mg/dL — ABNORMAL HIGH (ref 70–99)
Glucose-Capillary: 117 mg/dL — ABNORMAL HIGH (ref 70–99)
Glucose-Capillary: 119 mg/dL — ABNORMAL HIGH (ref 70–99)
Glucose-Capillary: 123 mg/dL — ABNORMAL HIGH (ref 70–99)
Glucose-Capillary: 145 mg/dL — ABNORMAL HIGH (ref 70–99)

## 2023-11-26 LAB — PHOSPHORUS: Phosphorus: 4.9 mg/dL — ABNORMAL HIGH (ref 2.5–4.6)

## 2023-11-26 LAB — MAGNESIUM: Magnesium: 2.3 mg/dL (ref 1.7–2.4)

## 2023-11-26 LAB — HEPARIN LEVEL (UNFRACTIONATED): Heparin Unfractionated: 0.41 [IU]/mL (ref 0.30–0.70)

## 2023-11-26 MED ORDER — TRAVASOL 10 % IV SOLN
INTRAVENOUS | Status: AC
  Filled 2023-11-26: qty 1320

## 2023-11-26 MED ORDER — FUROSEMIDE 10 MG/ML IJ SOLN
40.0000 mg | Freq: Once | INTRAMUSCULAR | Status: AC
  Administered 2023-11-26: 40 mg via INTRAVENOUS
  Filled 2023-11-26: qty 4

## 2023-11-26 NOTE — Progress Notes (Signed)
 PHARMACY - ANTICOAGULATION CONSULT NOTE  Pharmacy Consult for heparin  Indication: atrial fibrillation  Allergies  Allergen Reactions   Shrimp [Shellfish Allergy] Anaphylaxis and Swelling    Swelling throat    Reglan  [Metoclopramide ] Other (See Comments)    caused tardive dyskinesia    Patient Measurements: Height: 6' 4 (193 cm) Weight: 83.6 kg (184 lb 4.9 oz) IBW/kg (Calculated) : 86.8 HEPARIN  DW (KG): 94.6  Vital Signs: Temp: 98.2 F (36.8 C) (08/24 0853) Temp Source: Axillary (08/24 0853) BP: 131/58 (08/24 0800) Pulse Rate: 90 (08/24 0800)  Labs: Recent Labs    11/24/23 0401 11/24/23 1405 11/25/23 0523 11/25/23 1721 11/26/23 0440  HGB 9.7*  --  8.5*  --  8.5*  HCT 30.6*  --  26.1*  --  26.1*  PLT 195  --  186  --  204  HEPARINUNFRC 0.17*   < > 0.25* 0.38 0.41  CREATININE 0.41*  --  0.53*  --  0.57*   < > = values in this interval not displayed.    Estimated Creatinine Clearance: 98.7 mL/min (A) (by C-G formula based on SCr of 0.57 mg/dL (L)).   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  Patient reports he does not have symptoms as of 02/25/14.     Diarrhea 06/04/2023   Essential hypertension 06/04/2023   Fatty liver 06/04/2023   Flu 06/04/2023   Hyperlipidemia 06/04/2023   Ileus (HCC) 06/04/2023   Multiple myeloma (HCC)    Neuropathy    Paroxysmal atrial fibrillation (HCC) 07/04/2023   Pneumonia 06/04/2023     Assessment: 73 year old male with afib on Eliquis  PTA which is currently on hold. Patient recently admitted for bowel obstruction and discharged home after conservative treatment with NGT decompression. He presented back with similar concerns, currently being treated for ileus vs pSBO, plan to start TPN given  prolonged hospitalization without nutrition. His Eliquis  was initially resumed on admission and discontinued on 8/14. Has been on prophylactic Lovenox . Pharmacy consulted for heparin  initiation and management.  11/25/23 evening  -HL 0.41 - therapeutic on 2150 units/hr -No bleeding or line issues per RN noted   Goal of Therapy:  Heparin  level 0.3-0.7 units/ml Monitor platelets by anticoagulation protocol: Yes   Plan:  -Continue heparin  infusion at 2150 units/hr -Daily CBC, HL -Continue to follow for transition back to Eliquis  as appropriate   Dolphus Roller, PharmD, BCPS 11/26/2023 11:05 AM

## 2023-11-26 NOTE — Plan of Care (Signed)
  Problem: Clinical Measurements: Goal: Ability to maintain clinical measurements within normal limits will improve Outcome: Progressing   Problem: Education: Goal: Knowledge of General Education information will improve Description: Including pain rating scale, medication(s)/side effects and non-pharmacologic comfort measures Outcome: Not Progressing   Problem: Health Behavior/Discharge Planning: Goal: Ability to manage health-related needs will improve Outcome: Not Progressing   Problem: Activity: Goal: Risk for activity intolerance will decrease Outcome: Not Progressing   Problem: Nutrition: Goal: Adequate nutrition will be maintained Outcome: Not Progressing   Problem: Elimination: Goal: Will not experience complications related to bowel motility Outcome: Not Progressing

## 2023-11-26 NOTE — Progress Notes (Signed)
 [facility] Gastroenterology Progress Note  Arvo Ealy III 73 y.o. [birth year]  CC: Nausea, vomiting, generalized ileus   Subjective: Patient seen and examined at bedside.  NG tube remains in place.  No significant acute overnight issues.  ROS : Resting comfortably.  Afebrile   Objective: Vital signs in last 24 hours: Vitals:   11/26/23 0800 11/26/23 0853  BP: (!) 131/58   Pulse: 90   Resp: 17   Temp:  98.2 F (36.8 C)  SpO2: 99%     Physical Exam: Elderly appearing patient, no acute distress.  Abdomen is soft, hypoactive bowel sounds.  No peritoneal signs.  Mood and affect normal.  Lab Results: Recent Labs    11/25/23 0523 11/26/23 0440  NA 135 138  K 4.4 3.8  CL 99 96*  CO2 31 33*  GLUCOSE 114* 127*  BUN 20 23  CREATININE 0.53* 0.57*  CALCIUM  7.9* 8.1*  MG 2.2 2.3  PHOS 3.8 4.9*   Recent Labs    11/25/23 0523 11/26/23 0440  AST 73* 73*  ALT 64* 114*  ALKPHOS 70 80  BILITOT 0.4 0.4  PROT 4.8* 5.1*  ALBUMIN  1.6* 1.8*   Recent Labs    11/25/23 0523 11/26/23 0440  WBC 7.0 5.9  NEUTROABS 5.2 4.3  HGB 8.5* 8.5*  HCT 26.1* 26.1*  MCV 94.6 94.6  PLT 186 204   No results for input(s): LABPROT, INR in the last 72 hours.    Assessment/Plan: -Nausea and vomiting likely from generalized dysmotility and ileus.  Upper GI small bowel follow-through yesterday showed contrast retained in the stomach for 3 hours and exam was subsequently terminated.  Patient subsequently had repeat x-rays done which showed contrast material up to the rectum excluding complete obstruction. - Chronic anemia - Paroxysmal atrial fibrillation.  Currently on heparin  drip.  Eliquis  on hold. - Mildly elevated LFTs.  Likely transient  Recommendations ------------------------- - Patient is allergic to Reglan .  Continue erythromycin  for now - Repeat abdominal x-ray showed similar gaseous distention of the bowel likely from generalized/adynamic ileus. - May consider EGD early  next week to rule out any pyloric stricture but no obstructive lesion seen during upper GI small bowel follow-through. - Recommend ambulate with assistance of physical therapy to stimulate gastric and intestinal motility -Avoid narcotics - Continue TPN for now -Check hepatitis panel - GI will follow.    Layla Lah MD, FACP 11/26/2023, 9:41 AM  Contact #  [phone]

## 2023-11-26 NOTE — Progress Notes (Signed)
 BLE venous exam is completed. Bronda Alfred, RVT

## 2023-11-26 NOTE — Progress Notes (Signed)
 PROGRESS NOTE    David Mcgrath  FMW:[id number] DOB: 06-Aug-1950 DOA: 11/12/2023 PCP: [name], DO   Brief Narrative:  73 y.o. male with medical history significant for hypertension, hyperlipidemia, paroxysmal A-fib on Eliquis , chronic anxiety/depression, polyneuropathy, multiple myeloma (diagnosed March 2025) received Bortezomib  infusion 11/09/2023. Patient was admitted here 11/02/2023 through 11/05/2023 with intractable nausea vomiting, recurrent small bowel obstruction.  He was treated conservatively with NG tube decompression and discharged home.    Last admission CT abdomen/pelvis with contrast with slight improvement in small bowel dilation when compared to prior exam but with mild transition point deep in the pelvis, no pancreatic ductal dilation, no inflammatory changes, stable free fluid within the abdomen.  General surgery was consulted and patient underwent diagnostic laparoscopy by Dr. [name] on 11/03/2023 findings of intermittently dilated small bowel with erythema of the serosa consistent with inflammation/edema, intermittent injection of the small bowel mesentery, small bowel with tendency to migrate to the left upper quadrant with long/mobile small bowel mesentery with suspicion/potential for intermittent volvulized small bowel contributing to his symptoms and findings on imaging.  Diet was slowly advanced with toleration.    This admission CT abdomen shows increasing small bowel dilatation and fecalization of bowel contents involving the jejunum. No definitive transition point is identified at this time but caliber change is noted in the right mid abdomen at the junction of the jejunum and ileum. The more distal ileum is unremarkable. Stable ground-glass opacities in the lung bases similar to that seen on prior exams.   **Interim History Has had a persistent ileus and continues  to have gastric distention. Unfortunately lost his NG tube on 8/21 but had to be replaced by IR.  Now  connected back to [facility].  Underwent a upper GI series with small bowel follow-through and this shows contrast in the stomach with delayed gastric emptying.  Subsequently there was contrast in the rectum suggesting no complete obstruction.  GI recommending continuing erythromycin  for now and repeating abdominal x-rays and considering EGD early next week (Monday vs Tuesday) to rule out pyloric stricture and recommending ambulation.  Assessment/Plan:   Ileus versus partial small bowel obstruction: Patient presented with nausea vomiting and diarrhea.  CT scan raise concern for ileus versus partial SBO. Patient was given symptomatic treatment.  NGT was not placed initially Patient initially started improving but then had recurrence of his nausea and vomiting.  CT scan was repeated which showed persistent small bowel dilatation with prominent jejunum as seen before. -Minimal ambulation due to his weakness also -Trial of erythromycin  started on 11/18/2023 but not successful; began having large biliary emesis on 8/16; Allergic to Reglan  so will resume and retrial Erythromycin  again (IV) and increased dose to 500 mg TID  -NGT placed 8/16 and had to be replaced 8/21; continue NPO, IVF for now -Surgery to discuss if need for further repeat laparoscopy and they do not feel like it will be beneficial; Given prolonged hospitalization with minimal to no nutrition, now warrants TPN; PICC placed 8/18 and TPN started;   -Underwent UGI w/ SBFT per Surgery to assess for transit and patency and this was done and showed Contrast present in stomach with delayed gastric emptying. Minimize narcotics. C/w Antiemetics. GI feels it may take Days/weeks for this issue to resolve -Repeat KUB  8/22 AM showed  Minimal bowel contrast material is demonstrated. There appears to be contrast material in the rectum suggesting no evidence of complete obstruction.Gaseous distention of small bowel with gas-filled colon most likely ileus. -  Repeat  KUB this AM showed similar Diffuse gaseous distension of bowel in the abdomen and pelvis to prior films. -GI re-consulted and considering EGD early next week (Monday or Tuesday) to rule out any pyloric stricture but no obstructive lesion seen on the upper GI small bowel follow-through. GI recommended continue erythromycin  for now continue ambulation and mobilization to try and stimulate gastric intestinal motility and avoiding narcotics.  Hypotension - Resolved w/ IVF Lactic acidosis - resolved  Leukocytosis, fluctuating but now resolved - large GI losses on 8/16 with emesis episode, then became hypotensive and lactic elevated - s/p fluid boluses overnight and started on maintenance fluid -Lactic now normalized 8/17; low suspicion for infection; this appears hypovolemia for now -s/p IVF -Continue trending WBC; Current Trend Fluctuating but improved and WBC is 5.9. Holding off on abx at this time   Diarrhea: Reports semiformed stools.GI pathogen panel negative; Colonized with C. difficile but testing negative for active infection (antigen positive, toxin negative, PCR negative). Fecal Pouch in place for Diarrhea   Acute Urinary Retention: Foley catheter was placed on 8/11.  Voiding trial once he is more stable and/or stronger   History of Multiple Myeloma: Followed by medical oncology, Dr. Federico.  Some of his symptoms are thought to be due to his chemotherapeutic medications. Will re-engage Oncology to see if they have any further reccs   PAF:  Eliquis  was currently on hold in case he needs surgical intervention and remainson Heparin  gtt. Currently not on any rate limiting medications.  Home medication list reviewed.  He is on metoprolol  but only as needed. Continue to monitor on telemetry. -C/w heparin  while NPO and while off eliquis ; Continue for now given NPO  Left Fingers Discoloration:  Resolved and Improved. Noticed on 8/18 but wife states was present on 8/17 some and actually was improved  on 8/18; Fingers improved 8/20. Concern was for cyanosis as fingertips were also cold. -Had PICC line placed in left arm on 8/18 however wife states discoloration was present prior; there was also some extravasation from a prior IV in the left arm as well but unclear what was transfusing as happened prior to transfer to the ICU -Left Arterial Doppler ordered and shows no obstruction in the left upper extremity with triphasic waveforms; Also evaluated by vascular surgery, greatly appreciate assistance and no Surgical intervention needed. Finger discoloration has resolved as of 8/19;  Okay to discontinue neurovascular checks. Continue monitoring as necessary   Normocytic Anemia:  Hgb/Hct Trended down and is now 8.5/26.1 w/ MCV of 94.6. CTM for S/Sx of Bleeding; No overt bleeding noted. Stable hemoglobin noted and will need repeat CBC in the AM    Hyponatremia/Hypokalemia/Hypomagnesemia: Na+ is now 138, K+ is now 3.8, Mag is now 2.3. On TPN; Sodium level remains intermittently low.  There could be an element of SIADH but currently all Electrolytes stable. CTM    Hypoglycemia: Probably from poor oral intake. On D5 infusion in LR @ 10 mL/hr with improvement in glucose levels. Current CBG Trend ranging from 94-122  Abnormal LFTs: IN the setting of TPN and worsening. AST is now 73 -> 73 and ALT is now 64 -> 114.  CTM and Trend and if Necessary will obtain RUQ U/S; GI is checking an Acute Hepatitis Panel   Chronic Anxiety and Depression: Continue psychotropic medications once taking po Properly but currently has NGT to Suction     Physical Deconditioning: PT and OT recommendinfg SNF when medically stable for D/C but not medically ready  yet.  Hypoalbuminemia: Patient's Albumin  Trending down and went from 3.0 -> 1.6 -> 1.8. CTM and Trend and repeat CMP in the AM:   Lower Extremity Swelling: Lower extremity duplex to rule out DVT and this showed Findings consistent with chronic deep vein thrombosis involving  the right and left common femoral vein.  Findings are also consistent with age-indeterminate superficial vein thrombosis in the left small saphenous vein and no cystic structures found in the right and left popliteal fossa; Will give a dose of IV Lasix  40 mg x1 again. On Anticoagulation as above  Overweight: Complicates overall prognosis and care. Estimated body mass index is 22.43 kg/m as calculated from the following:   Height as of this encounter: 6' 4 (1.93 m).   Weight as of this encounter: 83.6 kg. Weight Loss and Dietary Counseling given   DVT prophylaxis: Place and maintain sequential compression device Start: 11/16/23 1142    Code Status: Full Code Family Communication: D/w Wife @ bedside   Disposition Plan:  Level of care: Stepdown Status is: Inpatient Remains inpatient appropriate because: Need to have improvement in his ileus and gastric dysmotility and will need to tolerate a diet w/o TPN and go to SNF once medically stable    Consultants:  Gastroenterology Interventional radiology Vascular Surgery General Surgery  Procedures:  As delineated as above  Antimicrobials:  Anti-infectives (From admission, onward)    Start     Dose/Rate Route Frequency Ordered Stop   11/24/23 1400  erythromycin  500 mg in sodium chloride  0.9 % 100 mL IVPB        500 mg 100 mL/hr over 60 Minutes Intravenous Every 8 hours 11/24/23 1027     11/23/23 2000  erythromycin  250 mg in sodium chloride  0.9 % 100 mL IVPB  Status:  Discontinued        250 mg 100 mL/hr over 60 Minutes Intravenous Every 8 hours 11/23/23 1744 11/24/23 1027   11/23/23 1200  erythromycin  (E-MYCIN ) tablet 250 mg  Status:  Discontinued        250 mg Oral 3 times daily before meals 11/23/23 1004 11/23/23 1744   11/18/23 1200  erythromycin  (E-MYCIN ) tablet 250 mg  Status:  Discontinued        250 mg Oral 2 times daily with meals 11/18/23 0720 11/19/23 0734   11/13/23 1000  acyclovir  (ZOVIRAX ) tablet 400 mg  Status:   Discontinued        400 mg Oral 2 times daily 11/13/23 0503 11/19/23 0739       Subjective: Seen and examined at bedside and remains frustrated continues to complain of being uncomfortable but denies any nausea or vomiting.  Denies any abdominal pain.  Still having some loose stools.  Continues to be fatigued.  No other concerns or complaints at this time.  Objective: Vitals:   11/26/23 1400 11/26/23 1500 11/26/23 1600 11/26/23 1731  BP: 139/75 (!) 140/70 (!) 145/83   Pulse: 91 93 93   Resp: (!) 31 (!) 25 (!) 33   Temp:    98.2 F (36.8 C)  TempSrc:    Oral  SpO2: 98% 98% 98%   Weight:      Height:        Intake/Output Summary (Last 24 hours) at 11/26/2023 1733 Last data filed at 11/26/2023 1600 Gross per 24 hour  Intake 3435.1 ml  Output 6450 ml  Net -3014.9 ml   Filed Weights   11/24/23 0409 11/25/23 0500 11/26/23 0446  Weight: 96.3 kg 96.3 kg  83.6 kg   Examination: Physical Exam:  Constitutional: WN/WD overweight elderly Caucasian male who is chronically ill-appearing and appears uncomfortable Respiratory: Diminished to auscultation bilaterally, no wheezing, rales, rhonchi or crackles. Normal respiratory effort and patient is not tachypenic. No accessory muscle use.  Unlabored breathing Cardiovascular: RRR, no murmurs / rubs / gallops. S1 and S2 auscultated.  1-2+ lower extremity edema Abdomen: Soft, non-tender, distended secondary to body habitus. Bowel sounds positive.  GU: Deferred. Musculoskeletal: No clubbing / cyanosis of digits/nails. No joint deformity upper and lower extremities.  Skin: No rashes, lesions, ulcers on limited skin evaluation. No induration; Warm and dry.  Neurologic: CN 2-12 grossly intact with no focal deficits. Romberg sign and cerebellar reflexes not assessed.  Psychiatric: Normal judgment and insight. Alert and oriented x 3.  Frustrated and little depressed mood and affect  Data Reviewed: I have personally reviewed following labs and imaging  studies  CBC: Recent Labs  Lab 11/22/23 0523 11/23/23 0407 11/24/23 0401 11/25/23 0523 11/26/23 0440  WBC 13.8* 8.7 13.1* 7.0 5.9  NEUTROABS 12.4* 7.3 11.3* 5.2 4.3  HGB 9.7* 8.8* 9.7* 8.5* 8.5*  HCT 29.9* 27.6* 30.6* 26.1* 26.1*  MCV 94.6 95.2 95.0 94.6 94.6  PLT 209 183 195 186 204   Basic Metabolic Panel: Recent Labs  Lab 11/22/23 0523 11/23/23 0407 11/24/23 0401 11/25/23 0523 11/26/23 0440  NA 133* 136 132* 135 138  K 3.6 4.3 4.4 4.4 3.8  CL 103 103 99 99 96*  CO2 25 27 28 31  33*  GLUCOSE 127* 120* 114* 114* 127*  BUN 17 15 16 20 23   CREATININE 0.49* 0.47* 0.41* 0.53* 0.57*  CALCIUM  7.6* 7.6* 7.8* 7.9* 8.1*  MG 1.9 2.0 1.9 2.2 2.3  PHOS 2.7 3.3 2.8 3.8 4.9*   GFR: Estimated Creatinine Clearance: 98.7 mL/min (A) (by C-G formula based on SCr of 0.57 mg/dL (L)). Liver Function Tests: Recent Labs  Lab 11/23/23 0407 11/24/23 0401 11/25/23 0523 11/26/23 0440  AST 19 32 73* 73*  ALT 19 27 64* 114*  ALKPHOS 56 68 70 80  BILITOT 0.2 0.4 0.4 0.4  PROT 4.4* 5.1* 4.8* 5.1*  ALBUMIN  1.7* 1.8* 1.6* 1.8*   No results for input(s): LIPASE, AMYLASE in the last 168 hours. No results for input(s): AMMONIA in the last 168 hours. Coagulation Profile: No results for input(s): INR, PROTIME in the last 168 hours. Cardiac Enzymes: No results for input(s): CKTOTAL, CKMB, CKMBINDEX, TROPONINI in the last 168 hours. BNP (last 3 results) No results for input(s): PROBNP in the last 8760 hours. HbA1C: No results for input(s): HGBA1C in the last 72 hours. CBG: Recent Labs  Lab 11/25/23 2255 11/26/23 0831 11/26/23 1159 11/26/23 1613 11/26/23 1646  GLUCAP 135* 119* 114* 123* 117*   Lipid Profile: No results for input(s): CHOL, HDL, LDLCALC, TRIG, CHOLHDL, LDLDIRECT in the last 72 hours. Thyroid  Function Tests: No results for input(s): TSH, T4TOTAL, FREET4, T3FREE, THYROIDAB in the last 72 hours. Anemia Panel: No results for  input(s): VITAMINB12, FOLATE, FERRITIN, TIBC, IRON, RETICCTPCT in the last 72 hours. Sepsis Labs: No results for input(s): PROCALCITON, LATICACIDVEN in the last 168 hours.  Recent Results (from the past 240 hours)  MRSA Next Gen by PCR, Nasal     Status: None   Collection Time: 11/19/23 12:11 AM   Specimen: Nasal Mucosa; Nasal Swab  Result Value Ref Range Status   MRSA by PCR Next Gen NOT DETECTED NOT DETECTED Final    Comment: (NOTE) The GeneXpert MRSA Assay (  FDA approved for NASAL specimens only), is one component of a comprehensive MRSA colonization surveillance program. It is not intended to diagnose MRSA infection nor to guide or monitor treatment for MRSA infections. Test performance is not FDA approved in patients less than 88 years old. Performed at [facility], [address] [place], [place] [address]     Radiology Studies: VAS US  LOWER EXTREMITY VENOUS (DVT) Result Date: 11/26/2023  Lower Venous DVT Study Patient Name:  David Mcgrath  Date of Exam:   11/26/2023 Medical Rec #: [id number]                Accession #:    [id number] Date of Birth: 05-05-1950                Patient Gender: M Patient Age:   7 years Exam Location:  [facility] Procedure:      VAS US  LOWER EXTREMITY VENOUS (DVT) Referring Phys: ALEJANDRO MARKER --------------------------------------------------------------------------------  Indications: Swelling, and Edema.  Limitations: Bandages and urinary catheter is attached to left thigh. Performing Technologist: Elmarie Lindau, RVT  Examination Guidelines: A complete evaluation includes B-mode imaging, spectral Doppler, color Doppler, and power Doppler as needed of all accessible portions of each vessel. Bilateral testing is considered an integral part of a complete examination. Limited examinations for reoccurring indications may be performed as noted. The reflux portion of the exam is performed with the patient in  reverse Trendelenburg.  +---------+---------------+---------+-----------+----------+--------------+ RIGHT    CompressibilityPhasicitySpontaneityPropertiesThrombus Aging +---------+---------------+---------+-----------+----------+--------------+ CFV      Partial                                      Chronic        +---------+---------------+---------+-----------+----------+--------------+ SFJ      Full                                                        +---------+---------------+---------+-----------+----------+--------------+ FV Prox  Full                                                        +---------+---------------+---------+-----------+----------+--------------+ FV Mid   Full                                                        +---------+---------------+---------+-----------+----------+--------------+ FV DistalFull                                                        +---------+---------------+---------+-----------+----------+--------------+ PFV      Full                                                        +---------+---------------+---------+-----------+----------+--------------+  POP      Full                                                        +---------+---------------+---------+-----------+----------+--------------+ PTV      Full                                                        +---------+---------------+---------+-----------+----------+--------------+ PERO     Full                                                        +---------+---------------+---------+-----------+----------+--------------+   +---------+---------------+---------+-----------+----------+-----------------+ LEFT     CompressibilityPhasicitySpontaneityPropertiesThrombus Aging    +---------+---------------+---------+-----------+----------+-----------------+ CFV      Partial                                      Chronic            +---------+---------------+---------+-----------+----------+-----------------+ SFJ      Full                                                           +---------+---------------+---------+-----------+----------+-----------------+ FV Prox  Full                                                           +---------+---------------+---------+-----------+----------+-----------------+ FV Mid                                                not assessed      +---------+---------------+---------+-----------+----------+-----------------+ FV Distal                                             not assessed      +---------+---------------+---------+-----------+----------+-----------------+ PFV      Full                                                           +---------+---------------+---------+-----------+----------+-----------------+ POP      Full                                                           +---------+---------------+---------+-----------+----------+-----------------+  PTV      Full                                                           +---------+---------------+---------+-----------+----------+-----------------+ PERO     Full                                                           +---------+---------------+---------+-----------+----------+-----------------+ SSV      None                                         Age Indeterminate +---------+---------------+---------+-----------+----------+-----------------+    Summary: RIGHT: - 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  - No cystic structure found in the popliteal fossa.  LEFT: - Findings consistent with age indeterminate superficial vein thrombosis involving the left small sahenous vein.  - Findings consistent with chronic deep vein thrombosis involving the left common femoral vein.  - No cystic structure found in the popliteal fossa.  *See  table(s) above for measurements and observations.    Preliminary    DG Abd 1 View Result Date: 11/26/2023 CLINICAL DATA:  Abdominal distension with ileus. EXAM: ABDOMEN - 1 VIEW COMPARISON:  11/25/2023 FINDINGS: Diffuse gaseous distension of bowel in the abdomen and pelvis is similar to prior. There is some contrast material visible in the left colon as before. NG tube tip is in the stomach. IMPRESSION: Diffuse gaseous distension of bowel in the abdomen and pelvis, similar to prior. Electronically Signed   By: Camellia Candle M.D.   On: 11/26/2023 09:16   DG Abd 1 View Result Date: 11/25/2023 CLINICAL DATA:  Intestinal pseudo-obstruction. Small-bowel follow-through protocol. 24 hour delay. EXAM: ABDOMEN - 1 VIEW COMPARISON:  11/24/2023. FINDINGS: Enteric tube with tip and side port in the gastric body. Minimal bowel contrast material is again noted along the left abdomen. The previously noted contrast material in the rectum is not included within the field of view of this exam. Persistent gaseous dilation of small bowel in the central and right abdomen and gas-filled left colon. IMPRESSION: Persistent gaseous dilation of small bowel in the central and right abdomen and gas-filled left colon, suggestive of ileus. Minimal bowel contrast material again seen along the left abdomen, although the previously noted contrast material in the rectum is not included within the field of view of this exam. Electronically Signed   By: Harrietta Sherry M.D.   On: 11/25/2023 10:50   Scheduled Meds:  butamben -tetracaine -benzocaine   1 spray Topical Once   Chlorhexidine  Gluconate Cloth  6 each Topical Daily   fluticasone   1 spray Each Nare Daily   furosemide   40 mg Intravenous Once   insulin  aspart  0-9 Units Subcutaneous Q8H   sodium chloride  flush  10-40 mL Intracatheter Q12H   Continuous Infusions:  dextrose  5% lactated ringers  10 mL/hr at 11/26/23 1600   erythromycin  Stopped (11/26/23 1433)   heparin  2,150 Units/hr  (11/26/23 1600)   TPN ADULT (ION) 100 mL/hr at 11/26/23 1600   TPN  ADULT (ION)      LOS: 13 days   Alejandro Marker, DO [facility] Available via Epic secure chat 7am-7pm After these hours, please refer to coverage provider listed on [email or web address] 11/26/2023, 5:33 PM

## 2023-11-26 NOTE — Progress Notes (Signed)
 PHARMACY - TOTAL PARENTERAL NUTRITION CONSULT NOTE   Indication: Bowel Obstruction  Patient Measurements: Height: 6' 4 (193 cm) Weight: 83.6 kg (184 lb 4.9 oz) IBW/kg (Calculated) : 86.8 TPN AdjBW (KG): 94.6 Body mass index is 22.43 kg/m. Usual Weight:   Assessment:  Pharmacy is consulted to start TPN on 73 yo male diagnosed with bowel obstruction. This admission CT abdomen shows increasing small bowel dilatation and fecalization of bowel contents involving the jejunum. No definitive transition point is identified at this time but caliber change is noted in the right mid abdomen at the junction of the jejunum and ileum. The more distal ileum is unremarkable. Some suspicion that bowel obstruction may be related to bortezomib  treatment pt receiving for multiple myeloma.   Glucose / Insulin : no hx of DM - CBG 118-135 - 2 unit of insulin  given  Electrolytes:  - Cl slightly low at 96, CO2 slightly up to 33, Phosphorus slightly elevated at 4.9,  - Electrolytes are WNL including CorrCa at 9.9 Renal: SCr < 1, BUN WNL Hepatic: LFTs slightly increased, alk phos, bilirubin WNL.  Albumin  low at 1.8 Intake / Output; MIVF:  - Output: stool uncharted, NG 3100 mL - Intake: D5LR at KVO, no enteral intake GI Imaging: -  Admitted from 10/14/23-10/20/23 for symptoms related to small bowel obstruction (transition point suspected in the right hemi-abdomen on CT imaging) and received non-operative management.  - Admitted 11/02/23 - 11/05/2023 for small bowel dilatation/concern for small bowel obstruction. CT imaging showed persistent small bowel dilatation with mild transition point noted deep in pelvis. He went to OR 11/03/23, findings were suspicious for intermittent volvulus of small bowel to explain his symptoms and CT imaging.  - 8/10  CT abdomen shows increasing small bowel dilatation and fecalization of bowel contents involving the jejunum. No definitive transition point is identified at this time but  caliber change is noted in the right mid abdomen at the junction of the jejunum and ileum.  GI Surgeries / Procedures:  8/1: to OR 11/03/23, findings were suspicious for intermittent volvulus of small bowel to explain his symptoms and CT imaging.  Central access:  PICC 8/18  Nutritional Goals: Goal TPN rate is 100  mL/hr (provides 132 g of protein and 2438 kcals per day)  RD Assessment:  Estimated Needs Total Energy Estimated Needs: 2400-2600 kcals Total Protein Estimated Needs: 120-135 grams Total Fluid Estimated Needs: >/= 2.4L  Current Nutrition:  NPO  Plan:  Continue TPN at 100 mL/hr, target rate  Electrolytes in TPN: (no changes) Na to 100 mEq/L  K 60 mEq/L  Ca 5 mEq/L Mg 10 mEq/L Phos 20 mmol/L.  Cl:Ac Max Cl Add standard MVI and trace elements to TPN Continue q8h Sensitive SSI and adjust as needed.  Thiamine  100 mg IV daily x 5 days completed on  8/22  MIVF at  [facility] mL/hr   Monitor TPN labs on Mon/Thurs.    Dolphus Roller, PharmD, BCPS 11/26/2023 10:16 AM

## 2023-11-26 NOTE — Plan of Care (Signed)
  Problem: Clinical Measurements: Goal: Ability to maintain clinical measurements within normal limits will improve Outcome: Progressing Goal: Will remain free from infection Outcome: Progressing Goal: Diagnostic test results will improve Outcome: Progressing Goal: Respiratory complications will improve Outcome: Progressing Goal: Cardiovascular complication will be avoided Outcome: Progressing   Problem: Nutrition: Goal: Adequate nutrition will be maintained Outcome: Progressing   Problem: Elimination: Goal: Will not experience complications related to bowel motility Outcome: Progressing Goal: Will not experience complications related to urinary retention Outcome: Progressing   Problem: Nutrition: Goal: Adequate nutrition will be maintained Outcome: Progressing

## 2023-11-27 ENCOUNTER — Inpatient Hospital Stay (HOSPITAL_COMMUNITY)

## 2023-11-27 DIAGNOSIS — K567 Ileus, unspecified: Secondary | ICD-10-CM | POA: Diagnosis not present

## 2023-11-27 DIAGNOSIS — K5669 Other partial intestinal obstruction: Secondary | ICD-10-CM | POA: Diagnosis not present

## 2023-11-27 LAB — CBC WITH DIFFERENTIAL/PLATELET
Abs Immature Granulocytes: 0.06 K/uL (ref 0.00–0.07)
Basophils Absolute: 0.1 K/uL (ref 0.0–0.1)
Basophils Relative: 1 %
Eosinophils Absolute: 0.2 K/uL (ref 0.0–0.5)
Eosinophils Relative: 3 %
HCT: 27 % — ABNORMAL LOW (ref 39.0–52.0)
Hemoglobin: 8.5 g/dL — ABNORMAL LOW (ref 13.0–17.0)
Immature Granulocytes: 1 %
Lymphocytes Relative: 12 %
Lymphs Abs: 0.7 K/uL (ref 0.7–4.0)
MCH: 30.2 pg (ref 26.0–34.0)
MCHC: 31.5 g/dL (ref 30.0–36.0)
MCV: 96.1 fL (ref 80.0–100.0)
Monocytes Absolute: 0.6 K/uL (ref 0.1–1.0)
Monocytes Relative: 10 %
Neutro Abs: 4.3 K/uL (ref 1.7–7.7)
Neutrophils Relative %: 73 %
Platelets: 249 K/uL (ref 150–400)
RBC: 2.81 MIL/uL — ABNORMAL LOW (ref 4.22–5.81)
RDW: 18 % — ABNORMAL HIGH (ref 11.5–15.5)
WBC: 5.9 K/uL (ref 4.0–10.5)
nRBC: 0 % (ref 0.0–0.2)

## 2023-11-27 LAB — GLUCOSE, CAPILLARY
Glucose-Capillary: 119 mg/dL — ABNORMAL HIGH (ref 70–99)
Glucose-Capillary: 126 mg/dL — ABNORMAL HIGH (ref 70–99)
Glucose-Capillary: 125 mg/dL — ABNORMAL HIGH (ref 70–99)

## 2023-11-27 LAB — COMPREHENSIVE METABOLIC PANEL WITH GFR
ALT: 94 U/L — ABNORMAL HIGH (ref 0–44)
AST: 50 U/L — ABNORMAL HIGH (ref 15–41)
Albumin: 2 g/dL — ABNORMAL LOW (ref 3.5–5.0)
Alkaline Phosphatase: 94 U/L (ref 38–126)
Anion gap: 6 (ref 5–15)
BUN: 26 mg/dL — ABNORMAL HIGH (ref 8–23)
CO2: 36 mmol/L — ABNORMAL HIGH (ref 22–32)
Calcium: 8.2 mg/dL — ABNORMAL LOW (ref 8.9–10.3)
Chloride: 101 mmol/L (ref 98–111)
Creatinine, Ser: 0.53 mg/dL — ABNORMAL LOW (ref 0.61–1.24)
GFR, Estimated: >60 mL/min (ref >60)
Glucose, Bld: 132 mg/dL — ABNORMAL HIGH (ref 70–99)
Potassium: 3.3 mmol/L — ABNORMAL LOW (ref 3.5–5.1)
Sodium: 143 mmol/L (ref 135–145)
Total Bilirubin: 0.4 mg/dL (ref 0.0–1.2)
Total Protein: 5.4 g/dL — ABNORMAL LOW (ref 6.5–8.1)

## 2023-11-27 LAB — HEPATITIS PANEL, ACUTE
HCV Ab: NONREACTIVE
Hep A IgM: NONREACTIVE
Hep B C IgM: NONREACTIVE
Hepatitis B Surface Ag: NONREACTIVE

## 2023-11-27 LAB — HEPARIN LEVEL (UNFRACTIONATED): Heparin Unfractionated: 0.54 [IU]/mL (ref 0.30–0.70)

## 2023-11-27 LAB — PHOSPHORUS: Phosphorus: 4.6 mg/dL (ref 2.5–4.6)

## 2023-11-27 LAB — TRIGLYCERIDES: Triglycerides: 106 mg/dL (ref <150)

## 2023-11-27 LAB — MAGNESIUM: Magnesium: 2.3 mg/dL (ref 1.7–2.4)

## 2023-11-27 MED ORDER — SODIUM CHLORIDE 0.9 % IV SOLN
500.0000 mg | Freq: Four times a day (QID) | INTRAVENOUS | Status: AC
  Administered 2023-11-27 – 2023-11-29 (×8): 500 mg via INTRAVENOUS
  Filled 2023-11-27 (×8): qty 10

## 2023-11-27 MED ORDER — POTASSIUM CHLORIDE 10 MEQ/100ML IV SOLN
10.0000 meq | INTRAVENOUS | Status: AC
  Administered 2023-11-27 (×3): 10 meq via INTRAVENOUS
  Filled 2023-11-27 (×3): qty 100

## 2023-11-27 MED ORDER — POTASSIUM CHLORIDE 10 MEQ/100ML IV SOLN
10.0000 meq | INTRAVENOUS | Status: DC
  Administered 2023-11-27: 10 meq via INTRAVENOUS
  Filled 2023-11-27: qty 100

## 2023-11-27 MED ORDER — TRAVASOL 10 % IV SOLN
INTRAVENOUS | Status: AC
  Filled 2023-11-27: qty 1320

## 2023-11-27 MED ORDER — POTASSIUM CHLORIDE 10 MEQ/100ML IV SOLN
10.0000 meq | INTRAVENOUS | Status: AC
  Administered 2023-11-27 (×2): 10 meq via INTRAVENOUS

## 2023-11-27 NOTE — Progress Notes (Signed)
 Subjective/Chief Complaint: Still with stool, but with high NGT output.  4L documented yesterday   Objective: Vital signs in last 24 hours: Temp:  [97.4 F (36.3 C)-98.2 F (36.8 C)] 97.6 F (36.4 C) (08/25 0400) Pulse Rate:  [46-95] 91 (08/25 0700) Resp:  [17-38] 18 (08/25 0700) BP: (131-160)/(58-86) 151/62 (08/25 0700) SpO2:  [95 %-99 %] 96 % (08/25 0700) Weight:  [85.1 kg] 85.1 kg (08/25 0500) Last BM Date : 11/26/23  Intake/Output from previous day: 08/24 0701 - 08/25 0700 In: 4313.4 [I.V.:3805.6; NG/GT:110; IV Piggyback:397.8] Out: 7475 [Urine:3175; Emesis/NG output:4100; Stool:200] Intake/Output this shift: No intake/output data recorded.  Gen: NAD, laying in bed Ab soft, ND, nontender, incisions clean and well healed   Lab Results:  Recent Labs    11/26/23 0440 11/27/23 0510  WBC 5.9 5.9  HGB 8.5* 8.5*  HCT 26.1* 27.0*  PLT 204 249   BMET Recent Labs    11/26/23 0440 11/27/23 0510  NA 138 143  K 3.8 3.3*  CL 96* 101  CO2 33* 36*  GLUCOSE 127* 132*  BUN 23 26*  CREATININE 0.57* 0.53*  CALCIUM  8.1* 8.2*   PT/INR No results for input(s): LABPROT, INR in the last 72 hours. ABG No results for input(s): PHART, HCO3 in the last 72 hours.  Invalid input(s): PCO2, PO2  Studies/Results: VAS US  LOWER EXTREMITY VENOUS (DVT) Result Date: 11/26/2023  Lower Venous DVT Study Patient Name:  David Mcgrath  Date of Exam:   11/26/2023 Medical Rec #: [id number]                Accession #:    [id number] Date of Birth: 07-16-1950                Patient Gender: M Patient Age:   73 years Exam Location:  [facility] Procedure:      VAS US  LOWER EXTREMITY VENOUS (DVT) Referring Phys: ALEJANDRO MARKER --------------------------------------------------------------------------------  Indications: Swelling, and Edema.  Limitations: Bandages and urinary catheter is attached to left thigh. Performing Technologist: Elmarie Lindau, RVT  Examination  Guidelines: A complete evaluation includes B-mode imaging, spectral Doppler, color Doppler, and power Doppler as needed of all accessible portions of each vessel. Bilateral testing is considered an integral part of a complete examination. Limited examinations for reoccurring indications may be performed as noted. The reflux portion of the exam is performed with the patient in reverse Trendelenburg.  +---------+---------------+---------+-----------+----------+--------------+ RIGHT    CompressibilityPhasicitySpontaneityPropertiesThrombus Aging +---------+---------------+---------+-----------+----------+--------------+ CFV      Partial                                      Chronic        +---------+---------------+---------+-----------+----------+--------------+ SFJ      Full                                                        +---------+---------------+---------+-----------+----------+--------------+ FV Prox  Full                                                        +---------+---------------+---------+-----------+----------+--------------+  FV Mid   Full                                                        +---------+---------------+---------+-----------+----------+--------------+ FV DistalFull                                                        +---------+---------------+---------+-----------+----------+--------------+ PFV      Full                                                        +---------+---------------+---------+-----------+----------+--------------+ POP      Full                                                        +---------+---------------+---------+-----------+----------+--------------+ PTV      Full                                                        +---------+---------------+---------+-----------+----------+--------------+ PERO     Full                                                         +---------+---------------+---------+-----------+----------+--------------+   +---------+---------------+---------+-----------+----------+-----------------+ LEFT     CompressibilityPhasicitySpontaneityPropertiesThrombus Aging    +---------+---------------+---------+-----------+----------+-----------------+ CFV      Partial                                      Chronic           +---------+---------------+---------+-----------+----------+-----------------+ SFJ      Full                                                           +---------+---------------+---------+-----------+----------+-----------------+ FV Prox  Full                                                           +---------+---------------+---------+-----------+----------+-----------------+ FV Mid  not assessed      +---------+---------------+---------+-----------+----------+-----------------+ FV Distal                                             not assessed      +---------+---------------+---------+-----------+----------+-----------------+ PFV      Full                                                           +---------+---------------+---------+-----------+----------+-----------------+ POP      Full                                                           +---------+---------------+---------+-----------+----------+-----------------+ PTV      Full                                                           +---------+---------------+---------+-----------+----------+-----------------+ PERO     Full                                                           +---------+---------------+---------+-----------+----------+-----------------+ SSV      None                                         Age Indeterminate +---------+---------------+---------+-----------+----------+-----------------+    Summary: RIGHT: - Findings consistent with 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  - No cystic structure found in the popliteal fossa.  LEFT: - Findings consistent with age indeterminate superficial vein thrombosis involving the left small sahenous vein.  - Findings consistent with chronic deep vein thrombosis involving the left common femoral vein.  - No cystic structure found in the popliteal fossa.  *See table(s) above for measurements and observations.    Preliminary    DG Abd 1 View Result Date: 11/26/2023 CLINICAL DATA:  Abdominal distension with ileus. EXAM: ABDOMEN - 1 VIEW COMPARISON:  11/25/2023 FINDINGS: Diffuse gaseous distension of bowel in the abdomen and pelvis is similar to prior. There is some contrast material visible in the left colon as before. NG tube tip is in the stomach. IMPRESSION: Diffuse gaseous distension of bowel in the abdomen and pelvis, similar to prior. Electronically Signed   By: Camellia Candle M.D.   On: 11/26/2023 09:16   DG Abd 1 View Result Date: 11/25/2023 CLINICAL DATA:  Intestinal pseudo-obstruction. Small-bowel follow-through protocol. 24 hour delay. EXAM: ABDOMEN - 1 VIEW COMPARISON:  11/24/2023. FINDINGS: Enteric tube with tip and side port in the gastric body. Minimal bowel contrast material is again noted along the left abdomen. The previously noted  contrast material in the rectum is not included within the field of view of this exam. Persistent gaseous dilation of small bowel in the central and right abdomen and gas-filled left colon. IMPRESSION: Persistent gaseous dilation of small bowel in the central and right abdomen and gas-filled left colon, suggestive of ileus. Minimal bowel contrast material again seen along the left abdomen, although the previously noted contrast material in the rectum is not included within the field of view of this exam. Electronically Signed   By: Harrietta Sherry M.D.   On: 11/25/2023 10:50    Anti-infectives: Anti-infectives (From admission, onward)     Start     Dose/Rate Route Frequency Ordered Stop   11/24/23 1400  erythromycin  500 mg in sodium chloride  0.9 % 100 mL IVPB        500 mg 100 mL/hr over 60 Minutes Intravenous Every 8 hours 11/24/23 1027     11/23/23 2000  erythromycin  250 mg in sodium chloride  0.9 % 100 mL IVPB  Status:  Discontinued        250 mg 100 mL/hr over 60 Minutes Intravenous Every 8 hours 11/23/23 1744 11/24/23 1027   11/23/23 1200  erythromycin  (E-MYCIN ) tablet 250 mg  Status:  Discontinued        250 mg Oral 3 times daily before meals 11/23/23 1004 11/23/23 1744   11/18/23 1200  erythromycin  (E-MYCIN ) tablet 250 mg  Status:  Discontinued        250 mg Oral 2 times daily with meals 11/18/23 0720 11/19/23 0734   11/13/23 1000  acyclovir  (ZOVIRAX ) tablet 400 mg  Status:  Discontinued        400 mg Oral 2 times daily 11/13/23 0503 11/19/23 0739       Assessment/Plan: POD 24, dx lsc for possible sbo-negative, 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  Considering EGD as well -UGI with SBFT essentially shows diffuse dilatation with delayed transit. -ngt to LIWS  -cont TNA for nutritional support -wife at bedside -no surgical role at this time.  Suspect this is related to dysmotility of stomach and small bowel.  Will defer further management to GI and primary care team.  Unfortunately nothing else to offer from a surgical standpoint.  FEN - NPO/NGT/IVFs/TNA VTE - Lovenox  ID - none  MM PCM - TNA AKI HTN HLD A fib  Burnard FORBES Banter, PA-C  7:54 AM  11/27/2023

## 2023-11-27 NOTE — Progress Notes (Signed)
 David Mcgrath 9:35 AM  Subjective: Patient seen and examined and case discussed with his wife as well as my partner Dr. Elicia and his nurse and his hospital computer chart reviewed and he denies any pain and he continues to have increased NG output minimal from his rectum and has no new complaints and is only able to sit up in a chair but hopefully will have physical therapy this week  Objective: Vital signs stable afebrile no acute distress abdomen is soft nontender labs and x-rays reviewed potassium a little low magnesium  okay white count okay  Assessment: Probable ileus  Plan: Will increase erythromycin  to every 6 hours and consider endoscopy and small bowel biopsy to evaluate for amyloid from his multiple myeloma and in the meantime increase potassium greater than 4 and according to the nurse he has not gotten any pain medicines and I answered all of the wife's questions  [facility]  office [phone] After 5PM or if no answer call [phone]

## 2023-11-27 NOTE — Progress Notes (Signed)
 PHARMACY - ANTICOAGULATION CONSULT NOTE  Pharmacy Consult for heparin  Indication: atrial fibrillation  Allergies  Allergen Reactions   Shrimp [Shellfish Allergy] Anaphylaxis and Swelling    Swelling throat    Reglan  [Metoclopramide ] Other (See Comments)    caused tardive dyskinesia    Patient Measurements: Height: 6' 4 (193 cm) Weight: 85.1 kg (187 lb 9.8 oz) IBW/kg (Calculated) : 86.8 HEPARIN  DW (KG): 94.6  Vital Signs: Temp: 97.6 F (36.4 C) (08/25 0400) Temp Source: Oral (08/25 0400) BP: 151/62 (08/25 0700) Pulse Rate: 91 (08/25 0700)  Labs: Recent Labs    11/25/23 0523 11/25/23 1721 11/26/23 0440 11/27/23 0510  HGB 8.5*  --  8.5* 8.5*  HCT 26.1*  --  26.1* 27.0*  PLT 186  --  204 249  HEPARINUNFRC 0.25* 0.38 0.41 0.54  CREATININE 0.53*  --  0.57* 0.53*    Estimated Creatinine Clearance: 100.5 mL/min (A) (by C-G formula based on SCr of 0.53 mg/dL (L)).   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  Patient reports he does not have symptoms as of 02/25/14.     Diarrhea 06/04/2023   Essential hypertension 06/04/2023   Fatty liver 06/04/2023   Flu 06/04/2023   Hyperlipidemia 06/04/2023   Ileus (HCC) 06/04/2023   Multiple myeloma (HCC)    Neuropathy    Paroxysmal atrial fibrillation (HCC) 07/04/2023   Pneumonia 06/04/2023     Assessment: 73 year old male with afib on Eliquis  PTA which is currently on hold. Patient recently admitted for bowel obstruction and discharged home after conservative treatment with NGT decompression. He presented back with similar concerns, currently being treated for ileus vs pSBO, plan to start TPN given prolonged hospitalization without nutrition. His Eliquis  was initially resumed on admission and  discontinued on 8/14. Has been on prophylactic Lovenox . Pharmacy consulted for heparin  initiation and management.  11/25/23 evening  - HL 0.54, therapeutic on heparin  2150 units/hr - CBC: Hgb low/stable at 8.5, Plt WNL - No bleeding or line issues per RN noted   Goal of Therapy:  Heparin  level 0.3-0.7 units/ml Monitor platelets by anticoagulation protocol: Yes   Plan:  - Continue heparin  infusion at 2150 units/hr - Daily CBC, HL - Continue to follow for transition back to Eliquis  as appropriate   Wanda Hasting PharmD, BCPS [facility] main pharmacy [phone] 11/27/2023 8:21 AM

## 2023-11-27 NOTE — Progress Notes (Signed)
 PROGRESS NOTE    David Mcgrath  FMW:[id number] DOB: Jan 29, 1951 DOA: 11/12/2023 PCP: [name], DO   Brief Narrative:  73 y.o. male with medical history significant for hypertension, hyperlipidemia, paroxysmal A-fib on Eliquis , chronic anxiety/depression, polyneuropathy, multiple myeloma (diagnosed March 2025) received Bortezomib  infusion 11/09/2023. Patient was admitted here 11/02/2023 through 11/05/2023 with intractable nausea vomiting, recurrent small bowel obstruction.  He was treated conservatively with NG tube decompression and discharged home.    Last admission CT abdomen/pelvis with contrast with slight improvement in small bowel dilation when compared to prior exam but with mild transition point deep in the pelvis, no pancreatic ductal dilation, no inflammatory changes, stable free fluid within the abdomen.  General surgery was consulted and patient underwent diagnostic laparoscopy by Dr. [name] on 11/03/2023 findings of intermittently dilated small bowel with erythema of the serosa consistent with inflammation/edema, intermittent injection of the small bowel mesentery, small bowel with tendency to migrate to the left upper quadrant with long/mobile small bowel mesentery with suspicion/potential for intermittent volvulized small bowel contributing to his symptoms and findings on imaging.  Diet was slowly advanced with toleration.    This admission CT abdomen shows increasing small bowel dilatation and fecalization of bowel contents involving the jejunum. No definitive transition point is identified at this time but caliber change is noted in the right mid abdomen at the junction of the jejunum and ileum. The more distal ileum is unremarkable. Stable ground-glass opacities in the lung bases similar to that seen on prior exams.   **Interim History Has had a persistent ileus and continues  to have gastric distention. Unfortunately lost his NG tube on 8/21 but had to be replaced by IR.  Now  connected back to [facility].  Underwent a upper GI series with small bowel follow-through and this shows contrast in the stomach with delayed gastric emptying.  Subsequently there was contrast in the rectum suggesting no complete obstruction.  GI recommending continuing erythromycin  for now and repeating abdominal x-rays and considering EGD early next week (Monday vs Tuesday) to rule out pyloric stricture and recommending ambulation.  8/25: Patient was admitted on 8/10 and I assumed care today on 8/25.  Please review prior notes.  Most of the stuff carried on for continued of care.  Further management as below.  Assessment/Plan:   # Ileus versus partial small bowel obstruction: Patient presented with nausea vomiting and diarrhea.  CT scan raise concern for ileus versus partial SBO. Patient was given symptomatic treatment.  NGT was not placed initially Patient initially started improving but then had recurrence of his nausea and vomiting.  CT scan was repeated which showed persistent small bowel dilatation with prominent jejunum as seen before. -Minimal ambulation due to his weakness also -Trial of erythromycin  started on 11/18/2023 but not successful; began having large biliary emesis on 8/16; Allergic to Reglan  so will resume and retrial Erythromycin  again (IV) and increased dose to 500 mg TID  -NGT placed 8/16 and had to be replaced 8/21; continue NPO, IVF for now -Surgery to discuss if need for further repeat laparoscopy and they do not feel like it will be beneficial; Given prolonged hospitalization with minimal to no nutrition, now warrants TPN; PICC placed 8/18 and TPN started;   -Underwent UGI w/ SBFT per Surgery to assess for transit and patency and this was done and showed Contrast present in stomach with delayed gastric emptying. Minimize narcotics. C/w Antiemetics. GI feels it may take Days/weeks for this issue to resolve -Repeat KUB  8/22 AM showed  Minimal bowel contrast material is  demonstrated. There appears to be contrast material in the rectum suggesting no evidence of complete obstruction.Gaseous distention of small bowel with gas-filled colon most likely ileus. -Repeat KUB this AM showed similar Diffuse gaseous distension of bowel in the abdomen and pelvis to prior films. -GI re-consulted and considering EGD early next week (Monday or Tuesday) to rule out any pyloric stricture but no obstructive lesion seen on the upper GI small bowel follow-through. GI recommended continue erythromycin  for now continue ambulation and mobilization to try and stimulate gastric intestinal motility and avoiding narcotics. 8/25: Increase erythromycin  every 6 hourly and plan is for EGD with push enteroscopy for amyloid due to his multiple myeloma.  Normalize electrolytes.   Hypotension - Resolved w/ IVF Lactic acidosis - resolved  Leukocytosis, fluctuating but now resolved - large GI losses on 8/16 with emesis episode, then became hypotensive and lactic elevated - s/p fluid boluses overnight and started on maintenance fluid -Lactic now normalized 8/17; low suspicion for infection; this appears hypovolemia for now -s/p IVF -Continue trending WBC; Current Trend Fluctuating but improved and WBC is 5.9. Holding off on abx at this time   Diarrhea: Reports semiformed stools.GI pathogen panel negative; Colonized with C. difficile but testing negative for active infection (antigen positive, toxin negative, PCR negative). Fecal Pouch in place for Diarrhea   Acute Urinary Retention: Foley catheter was placed on 8/11.  Voiding trial once he is more stable and/or stronger   History of Multiple Myeloma: Followed by medical oncology, Dr. Federico.  Some of his symptoms are thought to be due to his chemotherapeutic medications. Will re-engage Oncology to see if they have any further reccs   PAF:  Eliquis  was currently on hold in case he needs surgical intervention and remainson Heparin  gtt. Currently not  on any rate limiting medications.  Home medication list reviewed.  He is on metoprolol  but only as needed. Continue to monitor on telemetry. -C/w heparin  while NPO and while off eliquis ; Continue for now given NPO  Left Fingers Discoloration:  Resolved and Improved. Noticed on 8/18 but wife states was present on 8/17 some and actually was improved on 8/18; Fingers improved 8/20. Concern was for cyanosis as fingertips were also cold. -Had PICC line placed in left arm on 8/18 however wife states discoloration was present prior; there was also some extravasation from a prior IV in the left arm as well but unclear what was transfusing as happened prior to transfer to the ICU -Left Arterial Doppler ordered and shows no obstruction in the left upper extremity with triphasic waveforms; Also evaluated by vascular surgery, greatly appreciate assistance and no Surgical intervention needed. Finger discoloration has resolved as of 8/19;  Okay to discontinue neurovascular checks. Continue monitoring as necessary   Normocytic Anemia:  Hgb/Hct Trended down and is now 8.5/26.1 w/ MCV of 94.6. CTM for S/Sx of Bleeding; No overt bleeding noted. Stable hemoglobin noted and will need repeat CBC in the AM    Hyponatremia/Hypokalemia/Hypomagnesemia: Na+ is now 138, K+ is now 3.8, Mag is now 2.3. On TPN; Sodium level remains intermittently low.  There could be an element of SIADH but currently all Electrolytes stable. CTM  8/25 k 3.3, 40 mEq K IV given    Hypoglycemia: Probably from poor oral intake. On D5 infusion in LR @ 10 mL/hr with improvement in glucose levels. Current CBG Trend ranging from 94-122  Abnormal LFTs: IN the setting of TPN and worsening. AST  is now 73 -> 73 and ALT is now 64 -> 114.  CTM and Trend and if Necessary will obtain RUQ U/S; GI is checking an Acute Hepatitis Panel   Chronic Anxiety and Depression: Continue psychotropic medications once taking po Properly but currently has NGT to Suction      Physical Deconditioning: PT and OT recommendinfg SNF when medically stable for D/C but not medically ready yet.  Hypoalbuminemia: Patient's Albumin  Trending down and went from 3.0 -> 1.6 -> 1.8. CTM and Trend and repeat CMP in the AM:   Lower Extremity Swelling: Lower extremity duplex to rule out DVT and this showed Findings consistent with chronic deep vein thrombosis involving the right and left common femoral vein.  Findings are also consistent with age-indeterminate superficial vein thrombosis in the left small saphenous vein and no cystic structures found in the right and left popliteal fossa; Will give a dose of IV Lasix  40 mg x1 again. On Anticoagulation as above  Overweight: Complicates overall prognosis and care. Estimated body mass index is 22.43 kg/m as calculated from the following:   Height as of this encounter: 6' 4 (1.93 m).   Weight as of this encounter: 83.6 kg. Weight Loss and Dietary Counseling given   DVT prophylaxis: Place and maintain sequential compression device Start: 11/16/23 1142    Code Status: Full Code Family Communication: D/w Wife @ bedside   Disposition Plan:  Level of care: Stepdown Status is: Inpatient Remains inpatient appropriate because: Need to have improvement in his ileus and gastric dysmotility and will need to tolerate a diet w/o TPN and go to SNF once medically stable    Consultants:  Gastroenterology Interventional radiology Vascular Surgery General Surgery  Procedures:  As delineated as above  Antimicrobials:  Anti-infectives (From admission, onward)    Start     Dose/Rate Route Frequency Ordered Stop   11/27/23 1200  erythromycin  500 mg in sodium chloride  0.9 % 100 mL IVPB        500 mg 100 mL/hr over 60 Minutes Intravenous Every 6 hours 11/27/23 0933     11/24/23 1400  erythromycin  500 mg in sodium chloride  0.9 % 100 mL IVPB  Status:  Discontinued        500 mg 100 mL/hr over 60 Minutes Intravenous Every 8 hours 11/24/23 1027  11/27/23 0933   11/23/23 2000  erythromycin  250 mg in sodium chloride  0.9 % 100 mL IVPB  Status:  Discontinued        250 mg 100 mL/hr over 60 Minutes Intravenous Every 8 hours 11/23/23 1744 11/24/23 1027   11/23/23 1200  erythromycin  (E-MYCIN ) tablet 250 mg  Status:  Discontinued        250 mg Oral 3 times daily before meals 11/23/23 1004 11/23/23 1744   11/18/23 1200  erythromycin  (E-MYCIN ) tablet 250 mg  Status:  Discontinued        250 mg Oral 2 times daily with meals 11/18/23 0720 11/19/23 0734   11/13/23 1000  acyclovir  (ZOVIRAX ) tablet 400 mg  Status:  Discontinued        400 mg Oral 2 times daily 11/13/23 0503 11/19/23 0739       Subjective: Patient was seen and examined at bedside today morning. Patient still has NG tube with intermittent suction and significant amount of fluid is coming out.  Patient denied any abdominal pain, no BM. Resting comfortably, denied any active issues   Objective: Vitals:   11/27/23 0900 11/27/23 1000 11/27/23 1100 11/27/23 1200  BP: (!) 152/73   (!) 145/76  Pulse: 91 91 74 92  Resp: (!) 21 (!) 26 17 (!) 22  Temp:      TempSrc:      SpO2: 97% 97% 96% 98%  Weight:      Height:        Intake/Output Summary (Last 24 hours) at 11/27/2023 1206 Last data filed at 11/27/2023 1202 Gross per 24 hour  Intake 3611.74 ml  Output 8375 ml  Net -4763.26 ml   Filed Weights   11/25/23 0500 11/26/23 0446 11/27/23 0500  Weight: 96.3 kg 83.6 kg 85.1 kg   Examination: Physical Exam:  Constitutional: WN/WD overweight elderly Caucasian male who is chronically ill-appearing and appears uncomfortable Respiratory: Diminished to auscultation bilaterally, no wheezing, rales, rhonchi or crackles. Normal respiratory effort and patient is not tachypenic. No accessory muscle use.  Unlabored breathing Cardiovascular: RRR, no murmurs / rubs / gallops. S1 and S2 auscultated.  Mild lower extremity edema Abdomen: Soft, non-tender, distended secondary to body habitus.  Bowel sounds positive.  GU: Deferred. Musculoskeletal: No clubbing / cyanosis of digits/nails. No joint deformity upper and lower extremities.  Skin: No rashes, lesions, ulcers on limited skin evaluation. No induration; Warm and dry.  Neurologic: CN 2-12 grossly intact with no focal deficits. Romberg sign and cerebellar reflexes not assessed.  Psychiatric: Normal judgment and insight. Alert and oriented x 3.  Frustrated and little depressed mood and affect  Data Reviewed: I have personally reviewed following labs and imaging studies  CBC: Recent Labs  Lab 11/23/23 0407 11/24/23 0401 11/25/23 0523 11/26/23 0440 11/27/23 0510  WBC 8.7 13.1* 7.0 5.9 5.9  NEUTROABS 7.3 11.3* 5.2 4.3 4.3  HGB 8.8* 9.7* 8.5* 8.5* 8.5*  HCT 27.6* 30.6* 26.1* 26.1* 27.0*  MCV 95.2 95.0 94.6 94.6 96.1  PLT 183 195 186 204 249   Basic Metabolic Panel: Recent Labs  Lab 11/23/23 0407 11/24/23 0401 11/25/23 0523 11/26/23 0440 11/27/23 0510  NA 136 132* 135 138 143  K 4.3 4.4 4.4 3.8 3.3*  CL 103 99 99 96* 101  CO2 27 28 31  33* 36*  GLUCOSE 120* 114* 114* 127* 132*  BUN 15 16 20 23  26*  CREATININE 0.47* 0.41* 0.53* 0.57* 0.53*  CALCIUM  7.6* 7.8* 7.9* 8.1* 8.2*  MG 2.0 1.9 2.2 2.3 2.3  PHOS 3.3 2.8 3.8 4.9* 4.6   GFR: Estimated Creatinine Clearance: 100.5 mL/min (A) (by C-G formula based on SCr of 0.53 mg/dL (L)). Liver Function Tests: Recent Labs  Lab 11/23/23 0407 11/24/23 0401 11/25/23 0523 11/26/23 0440 11/27/23 0510  AST 19 32 73* 73* 50*  ALT 19 27 64* 114* 94*  ALKPHOS 56 68 70 80 94  BILITOT 0.2 0.4 0.4 0.4 0.4  PROT 4.4* 5.1* 4.8* 5.1* 5.4*  ALBUMIN  1.7* 1.8* 1.6* 1.8* 2.0*   No results for input(s): LIPASE, AMYLASE in the last 168 hours. No results for input(s): AMMONIA in the last 168 hours. Coagulation Profile: No results for input(s): INR, PROTIME in the last 168 hours. Cardiac Enzymes: No results for input(s): CKTOTAL, CKMB, CKMBINDEX, TROPONINI in the  last 168 hours. BNP (last 3 results) No results for input(s): PROBNP in the last 8760 hours. HbA1C: No results for input(s): HGBA1C in the last 72 hours. CBG: Recent Labs  Lab 11/26/23 1613 11/26/23 1646 11/26/23 2338 11/27/23 0513 11/27/23 0823  GLUCAP 123* 117* 145* 119* 126*   Lipid Profile: Recent Labs    11/27/23 0510  TRIG 106   Thyroid   Function Tests: No results for input(s): TSH, T4TOTAL, FREET4, T3FREE, THYROIDAB in the last 72 hours. Anemia Panel: No results for input(s): VITAMINB12, FOLATE, FERRITIN, TIBC, IRON, RETICCTPCT in the last 72 hours. Sepsis Labs: No results for input(s): PROCALCITON, LATICACIDVEN in the last 168 hours.  Recent Results (from the past 240 hours)  MRSA Next Gen by PCR, Nasal     Status: None   Collection Time: 11/19/23 12:11 AM   Specimen: Nasal Mucosa; Nasal Swab  Result Value Ref Range Status   MRSA by PCR Next Gen NOT DETECTED NOT DETECTED Final    Comment: (NOTE) The GeneXpert MRSA Assay (FDA approved for NASAL specimens only), is one component of a comprehensive MRSA colonization surveillance program. It is not intended to diagnose MRSA infection nor to guide or monitor treatment for MRSA infections. Test performance is not FDA approved in patients less than 30 years old. Performed at [facility], [address], [place], [place] [address]     Radiology Studies: DG Abd 1 View Result Date: 11/27/2023 CLINICAL DATA:  73 year old male with abdominal distension. Dilated small bowel on CT 11/16/2023. Right abdominal transition point suspected. EXAM: ABDOMEN - 1 VIEW COMPARISON:  Portable radiographs yesterday and earlier. FINDINGS: Portable AP supine view at 0452 hours. Enteric tube terminates in the left upper quadrant, satisfactory at the level of the stomach. Bowel gas pattern not significantly changed from yesterday and dilated gas containing small bowel loops in the right abdomen,  right upper quadrant persist. Stable lung bases. There is large bowel gas present including in the pelvis, rectum. No dilated large bowel. Stable visualized osseous structures. IMPRESSION: 1. Stable enteric tube. 2. No improvement in bowel gas pattern: dilated gas containing small bowel loops in the right abdomen. Nondilated gas containing large bowel including in the rectum. Constellation favors a persistent partial small bowel obstruction. Electronically Signed   By: VEAR Hurst M.D.   On: 11/27/2023 08:48   VAS US  LOWER EXTREMITY VENOUS (DVT) Result Date: 11/26/2023  Lower Venous DVT Study Patient Name:  Jariah Jarmon Mcgrath  Date of Exam:   11/26/2023 Medical Rec #: [id number]                Accession #:    [id number] Date of Birth: February 26, 1951                Patient Gender: M Patient Age:   59 years Exam Location:  [facility] Procedure:      VAS US  LOWER EXTREMITY VENOUS (DVT) Referring Phys: ALEJANDRO MARKER --------------------------------------------------------------------------------  Indications: Swelling, and Edema.  Limitations: Bandages and urinary catheter is attached to left thigh. Performing Technologist: Elmarie Lindau, RVT  Examination Guidelines: A complete evaluation includes B-mode imaging, spectral Doppler, color Doppler, and power Doppler as needed of all accessible portions of each vessel. Bilateral testing is considered an integral part of a complete examination. Limited examinations for reoccurring indications may be performed as noted. The reflux portion of the exam is performed with the patient in reverse Trendelenburg.  +---------+---------------+---------+-----------+----------+--------------+ RIGHT    CompressibilityPhasicitySpontaneityPropertiesThrombus Aging +---------+---------------+---------+-----------+----------+--------------+ CFV      Partial                                      Chronic         +---------+---------------+---------+-----------+----------+--------------+ SFJ      Full                                                        +---------+---------------+---------+-----------+----------+--------------+  FV Prox  Full                                                        +---------+---------------+---------+-----------+----------+--------------+ FV Mid   Full                                                        +---------+---------------+---------+-----------+----------+--------------+ FV DistalFull                                                        +---------+---------------+---------+-----------+----------+--------------+ PFV      Full                                                        +---------+---------------+---------+-----------+----------+--------------+ POP      Full                                                        +---------+---------------+---------+-----------+----------+--------------+ PTV      Full                                                        +---------+---------------+---------+-----------+----------+--------------+ PERO     Full                                                        +---------+---------------+---------+-----------+----------+--------------+   +---------+---------------+---------+-----------+----------+-----------------+ LEFT     CompressibilityPhasicitySpontaneityPropertiesThrombus Aging    +---------+---------------+---------+-----------+----------+-----------------+ CFV      Partial                                      Chronic           +---------+---------------+---------+-----------+----------+-----------------+ SFJ      Full                                                           +---------+---------------+---------+-----------+----------+-----------------+ FV Prox  Full                                                            +---------+---------------+---------+-----------+----------+-----------------+  FV Mid                                                not assessed      +---------+---------------+---------+-----------+----------+-----------------+ FV Distal                                             not assessed      +---------+---------------+---------+-----------+----------+-----------------+ PFV      Full                                                           +---------+---------------+---------+-----------+----------+-----------------+ POP      Full                                                           +---------+---------------+---------+-----------+----------+-----------------+ PTV      Full                                                           +---------+---------------+---------+-----------+----------+-----------------+ PERO     Full                                                           +---------+---------------+---------+-----------+----------+-----------------+ SSV      None                                         Age Indeterminate +---------+---------------+---------+-----------+----------+-----------------+    Summary: RIGHT: - 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  - No cystic structure found in the popliteal fossa.  LEFT: - Findings consistent with age indeterminate superficial vein thrombosis involving the left small sahenous vein.  - Findings consistent with chronic deep vein thrombosis involving the left common femoral vein.  - No cystic structure found in the popliteal fossa.  *See table(s) above for measurements and observations.    Preliminary    DG Abd 1 View Result Date: 11/26/2023 CLINICAL DATA:  Abdominal distension with ileus. EXAM: ABDOMEN - 1 VIEW COMPARISON:  11/25/2023 FINDINGS: Diffuse gaseous distension of bowel in the abdomen and pelvis is similar to prior. There is some  contrast material visible in the left colon as before. NG tube tip is in the stomach. IMPRESSION: Diffuse gaseous distension of bowel in the abdomen and pelvis, similar to prior. Electronically Signed   By: Camellia Candle M.D.   On: 11/26/2023 09:16   Scheduled Meds:  butamben -tetracaine -benzocaine   1 spray Topical Once   Chlorhexidine  Gluconate Cloth  6 each Topical Daily   fluticasone   1 spray Each Nare Daily   insulin  aspart  0-9 Units Subcutaneous Q8H   sodium chloride  flush  10-40 mL Intracatheter Q12H   Continuous Infusions:  dextrose  5% lactated ringers  Stopped (11/27/23 1110)   erythromycin  100 mL/hr at 11/27/23 1202   heparin  2,150 Units/hr (11/27/23 1202)   potassium chloride  100 mL/hr at 11/27/23 1202   TPN ADULT (ION) 100 mL/hr at 11/27/23 1202   TPN ADULT (ION)      LOS: 14 days   Elvan Sor, MD [facility] Available via Epic secure chat 7am-7pm After these hours, please refer to coverage provider listed on [email or web address] 11/27/2023, 12:06 PM

## 2023-11-27 NOTE — Progress Notes (Signed)
 PHARMACY - TOTAL PARENTERAL NUTRITION CONSULT NOTE   Indication: Bowel Obstruction  Patient Measurements: Height: 6' 4 (193 cm) Weight: 85.1 kg (187 lb 9.8 oz) IBW/kg (Calculated) : 86.8 TPN AdjBW (KG): 94.6 Body mass index is 22.84 kg/m. Usual Weight:   Assessment:  Pharmacy is consulted to start TPN on 73 yo male diagnosed with bowel obstruction. This admission CT abdomen shows increasing small bowel dilatation and fecalization of bowel contents involving the jejunum. No definitive transition point is identified at this time but caliber change is noted in the right mid abdomen at the junction of the jejunum and ileum. The more distal ileum is unremarkable. Some suspicion that bowel obstruction may be related to bortezomib  treatment pt receiving for multiple myeloma.   Glucose / Insulin : no hx of DM - CBG 114-145 - 2 unit of insulin  given  Electrolytes: K 3.3, Na up to 143, CO2 elevated at 36, CorrCa 9.8 - Mag and Phos at upper end of range Renal: SCr < 1, BUN WNL Hepatic: LFTs elevated, but decreased.  Tbili, alk phos, WNL.  Albumin  low at 2 Intake / Output; MIVF:  - Output: stool , NG 4100 mL, urine   - Intake: D5LR at [facility], no enteral intake - Mediations:  Lasix  40mg  8/24 pm, increase to Erythromycin  500mg  IV q6h 8/25 GI Imaging: -  Admitted from 10/14/23-10/20/23 for symptoms related to small bowel obstruction (transition point suspected in the right hemi-abdomen on CT imaging) and received non-operative management.  - Admitted 11/02/23 - 11/05/2023 for small bowel dilatation/concern for small bowel obstruction. CT imaging showed persistent small bowel dilatation with mild transition point noted deep in pelvis. He went to OR 11/03/23, findings were suspicious for intermittent volvulus of small bowel to explain his symptoms and CT imaging.  - 8/10  CT abdomen shows increasing small bowel dilatation and fecalization of bowel contents involving the jejunum. No definitive  transition point is identified at this time but caliber change is noted in the right mid abdomen at the junction of the jejunum and ileum.  GI Surgeries / Procedures:  8/1:  suspicious for intermittent volvulus of small bowel to explain his symptoms and CT imaging.  Central access:  PICC 8/18  Nutritional Goals: Goal TPN rate is 100  mL/hr (provides 132 g of protein and 2438 kcals per day)  RD Assessment:  Estimated Needs Total Energy Estimated Needs: 2400-2600 kcals Total Protein Estimated Needs: 120-135 grams Total Fluid Estimated Needs: >/= 2.4L  Current Nutrition:  NPO and TPN  Plan:  Now: KCl 10mEq IV x4 runs  At 18:00 Continue TPN at 100 mL/hr, target rate  Electrolytes in TPN:  Na 100 mEq/L K 70 mEq/L (inc) Ca 5 mEq/L Mg 10 mEq/L Phos 15 mmol/L (dec) Cl:Ac Max Cl Add standard MVI and trace elements to TPN Continue q8h Sensitive SSI and adjust as needed.  Thiamine  100 mg IV daily x 5 days completed on  8/22  MIVF at  [facility] mL/hr   Monitor TPN labs on Mon/Thurs.     Wanda Hasting PharmD, BCPS [facility] main pharmacy [phone] 11/27/2023 7:55 AM

## 2023-11-27 NOTE — Progress Notes (Signed)
 Physical Therapy Treatment Patient Details Name: David Mcgrath MRN: [id number] DOB: 06-Apr-1950 Today's Date: 11/27/2023   History of Present Illness Pt is 73 yo male who presented on 11/12/23 with nausea, vomiting, diarrhea, hypotension.  CT raised concern for ileus vs partial SBO.  Pt recently admitted 7/31-8/3 for SBO, pt underwent diagnostic laparoscopy on 8/1.  This admission pt with persistent ileus, treated with NG tube 8/16 and replaced 8/21.  Surgery consulted and did not feel repeat laparoscopy beneficial.  TPN started 8/18. GI reconsulted for possible EGD but continuing erythromycin  for now. Pt also with hypotension, urinary retention, PAF, hyponatremia/hypokalemia/hypomagnesemia/hypoglycemia.  Pt also with LE edema - findings consistent with chronic DVT.  Pt with hx including but not limited to  hypertension, hyperlipidemia, paroxysmal A-fib on Eliquis , chronic anxiety/depression, polyneuropathy, multiple myeloma (diagnosed March 2025) received Bortezomib  infusion 11/09/2023.    PT Comments  Pt with improved transfer to EOB (mod A from max of 2) but then with syncopal symptoms and decreased BP (see comments).  Attempted AROM in bed prior to sitting and then at EOB but still lightheaded and had to return to supine.   Continue to progress as able.  Patient will benefit from continued inpatient follow up therapy, <3 hours/day at d/c.    If plan is discharge home, recommend the following: Assistance with cooking/housework;Help with stairs or ramp for entrance;Assist for transportation;Two people to help with walking and/or transfers;A lot of help with bathing/dressing/bathroom   Can travel by private vehicle     No  Equipment Recommendations  None recommended by PT    Recommendations for Other Services       Precautions / Restrictions Precautions Precautions: Fall Precaution/Restrictions Comments: NG tube     Mobility  Bed Mobility Overal bed mobility: Needs Assistance Bed  Mobility: Rolling, Sidelying to Sit, Sit to Supine Rolling: Min assist Sidelying to sit: Mod assist Supine to sit: Mod assist     General bed mobility comments: Pt requiring cues for rolling to side, bringing legs off, and lifting trunk - mod A to lift trunk.  For back to bed -mod A for legs.    Transfers                   General transfer comment: unable due to lightheaded and hypotension    Ambulation/Gait                   Stairs             Wheelchair Mobility     Tilt Bed    Modified Rankin (Stroke Patients Only)       Balance Overall balance assessment: Needs assistance Sitting-balance support: Feet supported, Single extremity supported Sitting balance-Leahy Scale: Poor Sitting balance - Comments: Pt sitting EOB for 8 mins with cues for posture and at least single UE support.  Pt with slumped posture and forward head that could partially correct with cues.  Pt unable to stay at EOB or stand due to lightheaded and hypotension                                    Communication    Cognition Arousal: Alert Behavior During Therapy: Flat affect   PT - Cognitive impairments: No apparent impairments                       PT - Cognition Comments:  increased time to respond at times; wife and nursing reports pt eager to get OOB today        Cueing    Exercises General Exercises - Lower Extremity Ankle Circles/Pumps: AROM, Both, 10 reps, Supine Quad Sets: AROM, Both, 10 reps, Supine Long Arc Quad: AROM, Both, 5 reps, Seated Heel Slides: AROM, Both, 5 reps, Supine Other Exercises Other Exercises: Supine 5 reps AAROM elbow flex and shoulder flex.  EOB 5 reps x 2 shoulder flex to 90 degrees.    General Comments General comments (skin integrity, edema, etc.): Pt performed supine exercises then sat EOB.  At EOB preparred for STEDY stand but pt reports needs to get barings.  Tried EOB exerices but reports lightheaded.  BP  found to be 90/58.  BP in supine prior was 144/91.  Tried some more UE exercises for blood flow but reports needs to lay down.  Once in bed pt reports feeling better.  Tried chair position but BP 104/65 and reports lightheaded.  Pt returned to suipne and BP upt to 113/53 and reports feeling better.  Encouraged HOB elevated when able and AROM exercises in bed.  Notified RN of BP and unable to get OOB.      Pertinent Vitals/Pain Pain Assessment Pain Assessment: Faces Faces Pain Scale: Hurts little more Pain Location: abdomen, NG tube Pain Descriptors / Indicators: Grimacing Pain Intervention(s): Limited activity within patient's tolerance, Monitored during session, Premedicated before session, Repositioned    Home Living                          Prior Function            PT Goals (current goals can now be found in the care plan section) Acute Rehab PT Goals Time For Goal Achievement: 12/11/23 Potential to Achieve Goals: Good Progress towards PT goals: Progressing toward goals;Goals downgraded-see care plan (Pt progressing slowly but ongoing medical issues so downgraded initial goals)    Frequency    Min 2X/week      PT Plan      Co-evaluation              AM-PAC PT 6 Clicks Mobility   Outcome Measure  Help needed turning from your back to your side while in a flat bed without using bedrails?: A Little Help needed moving from lying on your back to sitting on the side of a flat bed without using bedrails?: A Lot Help needed moving to and from a bed to a chair (including a wheelchair)?: Total Help needed standing up from a chair using your arms (e.g., wheelchair or bedside chair)?: Total Help needed to walk in hospital room?: Total Help needed climbing 3-5 steps with a railing? : Total 6 Click Score: 9    End of Session Equipment Utilized During Treatment: Gait belt Activity Tolerance: Treatment limited secondary to medical complications (Comment);Patient  limited by fatigue Patient left: with call bell/phone within reach;in bed;with bed alarm set;with family/visitor present Nurse Communication: Mobility status;Other (comment) (orthostatic hypotension) PT Visit Diagnosis: Unsteadiness on feet (R26.81);Other abnormalities of gait and mobility (R26.89);Muscle weakness (generalized) (M62.81)     Time: 8643-8570 PT Time Calculation (min) (ACUTE ONLY): 33 min  Charges:    $Therapeutic Activity: 23-37 mins PT General Charges $$ ACUTE PT VISIT: 1 Visit                     Benjiman, PT Acute Rehab Services [facility]  [phone]    Benjiman VEAR Mulberry 11/27/2023, 3:19 PM

## 2023-11-27 NOTE — Plan of Care (Signed)
  Problem: Clinical Measurements: Goal: Ability to maintain clinical measurements within normal limits will improve Outcome: Progressing Goal: Will remain free from infection Outcome: Progressing Goal: Diagnostic test results will improve Outcome: Progressing Goal: Cardiovascular complication will be avoided Outcome: Progressing   Problem: Nutrition: Goal: Adequate nutrition will be maintained Outcome: Progressing   Problem: Activity: Goal: Risk for activity intolerance will decrease Outcome: Not Progressing

## 2023-11-27 NOTE — Progress Notes (Signed)
 Nutrition Follow-up  DOCUMENTATION CODES:   Severe malnutrition in context of acute illness/injury  INTERVENTION:  - Continue goal TPN.              - TPN management per pharmacy.   - Daily weights while on TPN.  - Will monitor for diet advancement.  NUTRITION DIAGNOSIS:   Severe Malnutrition related to acute illness (recurrent small bowel obstruction) as evidenced by severe fat depletion, severe muscle depletion, energy intake < or equal to 50% for > or equal to 5 days, percent weight loss (22% in 1 month). *ongoing  GOAL:   Patient will meet greater than or equal to 90% of their needs *met with TPN  MONITOR:   Diet advancement, Labs, Weight trends  REASON FOR ASSESSMENT:   Consult New TPN/TNA  ASSESSMENT:   73 y.o. male with PMH significant for HTN, HLD, chronic anxiety/depression, polyneuropathy, multiple myeloma (diagnosed March 2025). Patient recently admitted both 7/12-7/18 and 7/31-8/3 with intractable nausea vomiting, recurrent small bowel obstruction and was treated conservatively with NG tube decompression and discharged home.  Presented back 8/10 with loose stools and cramping abdominal pain. Admitted for ileus vs partial SBO.  8/10 Admit 8/11 Soft diet 8/13 FLD 8/17 NPO; NGT placed 8/18 TPN initiated  Patient sleeping at time of visit. TPN continues to infuse at goal of 138mL/hr.  NGT remains to suction with high output.    Admit weight: 184# Current weight: 187# I&O's: -5.5L since admit  Medications reviewed and include: D5 @ 66mL/hr (provides 41 kcals over 24 hours)   Labs reviewed:  K+ 3.3 Triglycerides 106 (as of today)  Diet Order:   Diet Order             Diet NPO time specified Except for: Ice Chips, Other (See Comments)  Diet effective now                   EDUCATION NEEDS:  Education needs have been addressed  Skin:  Skin Assessment: Skin Integrity Issues: Skin Integrity Issues:: Stage I Stage I: Sacrum  Last BM:  8/24  - rectal tube  Height:  Ht Readings from Last 1 Encounters:  11/18/23 6' 4 (1.93 m)   Weight:  Wt Readings from Last 1 Encounters:  11/27/23 85.1 kg   Ideal Body Weight:  91.8 kg  BMI:  Body mass index is 22.84 kg/m.  Estimated Nutritional Needs:  Kcal:  2400-2600 kcals Protein:  120-135 grams Fluid:  >/= 2.4L    Trude Ned RD, LDN Contact via Secure Chat.

## 2023-11-28 DIAGNOSIS — G603 Idiopathic progressive neuropathy: Secondary | ICD-10-CM | POA: Diagnosis not present

## 2023-11-28 DIAGNOSIS — K567 Ileus, unspecified: Secondary | ICD-10-CM | POA: Diagnosis not present

## 2023-11-28 LAB — GLUCOSE, CAPILLARY
Glucose-Capillary: 125 mg/dL — ABNORMAL HIGH (ref 70–99)
Glucose-Capillary: 126 mg/dL — ABNORMAL HIGH (ref 70–99)
Glucose-Capillary: 128 mg/dL — ABNORMAL HIGH (ref 70–99)
Glucose-Capillary: 136 mg/dL — ABNORMAL HIGH (ref 70–99)

## 2023-11-28 LAB — BASIC METABOLIC PANEL WITH GFR
Anion gap: 8 (ref 5–15)
BUN: 28 mg/dL — ABNORMAL HIGH (ref 8–23)
CO2: 35 mmol/L — ABNORMAL HIGH (ref 22–32)
Calcium: 8.4 mg/dL — ABNORMAL LOW (ref 8.9–10.3)
Chloride: 104 mmol/L (ref 98–111)
Creatinine, Ser: 0.58 mg/dL — ABNORMAL LOW (ref 0.61–1.24)
GFR, Estimated: >60 mL/min (ref >60)
Glucose, Bld: 129 mg/dL — ABNORMAL HIGH (ref 70–99)
Potassium: 3.5 mmol/L (ref 3.5–5.1)
Sodium: 147 mmol/L — ABNORMAL HIGH (ref 135–145)

## 2023-11-28 LAB — HEPARIN LEVEL (UNFRACTIONATED): Heparin Unfractionated: 0.62 [IU]/mL (ref 0.30–0.70)

## 2023-11-28 MED ORDER — SODIUM CHLORIDE 0.9 % IV SOLN: INTRAVENOUS | Status: AC

## 2023-11-28 MED ORDER — POTASSIUM CHLORIDE 10 MEQ/100ML IV SOLN
10.0000 meq | INTRAVENOUS | Status: AC
  Administered 2023-11-28 (×3): 10 meq via INTRAVENOUS
  Filled 2023-11-28: qty 100

## 2023-11-28 MED ORDER — TRAVASOL 10 % IV SOLN
INTRAVENOUS | Status: AC
  Filled 2023-11-28: qty 1320

## 2023-11-28 MED ORDER — PANTOPRAZOLE SODIUM 40 MG IV SOLR
40.0000 mg | Freq: Two times a day (BID) | INTRAVENOUS | Status: DC
  Administered 2023-11-28 – 2023-12-03 (×11): 40 mg via INTRAVENOUS
  Filled 2023-11-28 (×11): qty 10

## 2023-11-28 NOTE — Progress Notes (Signed)
 PHARMACY - ANTICOAGULATION CONSULT NOTE  Pharmacy Consult for heparin  Indication: atrial fibrillation  Allergies  Allergen Reactions   Shrimp [Shellfish Allergy] Anaphylaxis and Swelling    Swelling throat    Reglan  [Metoclopramide ] Other (See Comments)    caused tardive dyskinesia    Patient Measurements: Height: 6' 4 (193 cm) Weight: 81.4 kg (179 lb 7.3 oz) IBW/kg (Calculated) : 86.8 HEPARIN  DW (KG): 94.6  Vital Signs: Temp: 98.5 F (36.9 C) (08/26 0500) Temp Source: Axillary (08/26 0500) BP: 116/51 (08/26 0600) Pulse Rate: 99 (08/26 0600)  Labs: Recent Labs    11/26/23 0440 11/27/23 0510 11/28/23 0504  HGB 8.5* 8.5*  --   HCT 26.1* 27.0*  --   PLT 204 249  --   HEPARINUNFRC 0.41 0.54 0.62  CREATININE 0.57* 0.53* 0.58*    Estimated Creatinine Clearance: 96.1 mL/min (A) (by C-G formula based on SCr of 0.58 mg/dL (L)).   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  Patient reports he does not have symptoms as of 02/25/14.     Diarrhea 06/04/2023   Essential hypertension 06/04/2023   Fatty liver 06/04/2023   Flu 06/04/2023   Hyperlipidemia 06/04/2023   Ileus (HCC) 06/04/2023   Multiple myeloma (HCC)    Neuropathy    Paroxysmal atrial fibrillation (HCC) 07/04/2023   Pneumonia 06/04/2023     Assessment: 73 year old male with afib on Eliquis  PTA which is currently on hold. Patient recently admitted for bowel obstruction and discharged home after conservative treatment with NGT decompression. He presented back with similar concerns, currently being treated for ileus vs pSBO, plan to start TPN given prolonged hospitalization without nutrition. His Eliquis  was initially resumed on admission and discontinued on 8/14. Has been on  prophylactic Lovenox . Pharmacy consulted for heparin  initiation and management.  11/25/23 evening  - HL 0.62, therapeutic on heparin  2150 units/hr.  Note levels trending up, but remains therapeutic today.  - CBC: (8/25) Hgb low/stable at 8.5, Plt WNL - No bleeding or line issues per RN noted   Goal of Therapy:  Heparin  level 0.3-0.7 units/ml Monitor platelets by anticoagulation protocol: Yes   Plan:  - Continue heparin  infusion at 2150 units/hr - Daily CBC, HL - Continue to follow for transition back to Eliquis  as appropriate   Wanda Hasting PharmD, BCPS [facility] main pharmacy [phone] 11/28/2023 7:21 AM

## 2023-11-28 NOTE — Plan of Care (Signed)
  Problem: Clinical Measurements: Goal: Ability to maintain clinical measurements within normal limits will improve Outcome: Progressing Goal: Will remain free from infection Outcome: Progressing Goal: Diagnostic test results will improve Outcome: Progressing Goal: Cardiovascular complication will be avoided Outcome: Progressing   Problem: Elimination: Goal: Will not experience complications related to bowel motility Outcome: Progressing Goal: Will not experience complications related to urinary retention Outcome: Progressing   

## 2023-11-28 NOTE — Progress Notes (Signed)
 Occupational Therapy Treatment Patient Details Name: David Mcgrath MRN: [id number] DOB: [birth year] Today's Date: 11/28/2023   History of present illness Pt is 73 yo male who presented on 11/12/23 with nausea, vomiting, diarrhea, hypotension.  CT raised concern for ileus vs partial SBO.  Pt recently admitted 7/31-8/3 for SBO, pt underwent diagnostic laparoscopy on 8/1.  This admission pt with persistent ileus, treated with NG tube 8/16 and replaced 8/21.  Surgery consulted and did not feel repeat laparoscopy beneficial.  TPN started 8/18. GI reconsulted for possible EGD but continuing erythromycin  for now. Pt also with hypotension, urinary retention, PAF, hyponatremia/hypokalemia/hypomagnesemia/hypoglycemia.  Pt also with LE edema - findings consistent with chronic DVT.  Pt with hx including but not limited to  hypertension, hyperlipidemia, paroxysmal A-fib on Eliquis , chronic anxiety/depression, polyneuropathy, multiple myeloma (diagnosed March 2025) received Bortezomib  infusion 11/09/2023.   OT comments  Patient seen for skilled OT session this am. Wife bedside but stepped out during mobility portion of session reporting patient may do better without her present. See below for VSR for attempts to sit EOB. Sidelying with HOB elevated with significant assist then layed back down for rest break, vitals stabilized and then agreeable to ICU bed chair position for light UE therex with wife and nursing present again bedside for encouragement between visits. Patient requires continued Acute care hospital level OT services to progress safety and functional performance and allow for discharge. Patient will benefit from continued inpatient follow up therapy, <3 hours/day.         If plan is discharge home, recommend the following:  Two people to help with walking and/or transfers;Two people to help with bathing/dressing/bathroom;Assistance with cooking/housework;Assistance with feeding;Direct  supervision/assist for medications management;Direct supervision/assist for financial management;Assist for transportation;Help with stairs or ramp for entrance   Equipment Recommendations  Other (comment) TBD post therapy venue recs       Precautions / Restrictions Precautions Precautions: Fall Recall of Precautions/Restrictions: Intact Precaution/Restrictions Comments: NG tube, watch BP, ACE and abd binder requested Restrictions Weight Bearing Restrictions Per Provider Order: No       Mobility Bed Mobility Overal bed mobility: Needs Assistance Bed Mobility: Rolling, Sidelying to Sit, Sit to Supine Rolling: Min assist Sidelying to sit: Mod assist Supine to sit: Mod assist Sit to supine: +2 for safety/equipment, +2 for physical assistance, Max assist   General bed mobility comments: slow position changs, cues, use of bed features but became symptomatic with drop in BP in supported sidelying almost to sit, returned back to bed and then patient tolerated chair position in ICU bed    Transfers                         Balance Overall balance assessment: Needs assistance Sitting-balance support:  (R shoulder supported with HOB elevation in sidelying) Sitting balance-Leahy Scale: Poor Sitting balance - Comments: support needed this session with bed features due to hypotensive and poor tolerance to progress fully upright                                   ADL either performed or assessed with clinical judgement   ADL Overall ADL's : Needs assistance/impaired Eating/Feeding: NPO (accept ice chips with assist)   Grooming: Wash/dry face;Bed level;Minimal assistance Grooming Details (indicate cue type and reason): UE weakness to reach face and sustain tasks  Functional mobility during ADLs: +2 for physical assistance;+2 for safety/equipment;Maximal assistance (Sidelying with HOB elevated with significant assist then  layed back down and agreeable to ICU bed chair position) General ADL Comments: Session focused on improving activity tolerance with triial to EOB, chair position and bed level HEP and EOB sitting balance/tolerance with wife present at end of session .    Extremity/Trunk Assessment Upper Extremity Assessment Upper Extremity Assessment: Generalized weakness   Lower Extremity Assessment Lower Extremity Assessment: Defer to PT evaluation        Vision   Vision Assessment?: No apparent visual deficits;Wears glasses for reading         Communication Communication Communication: No apparent difficulties   Cognition Arousal: Alert Behavior During Therapy: Flat affect Cognition: Cognition impaired         Attention impairment (select first level of impairment): Sustained attention Executive functioning impairment (select all impairments): Initiation, Problem solving, Sequencing OT - Cognition Comments: requires time for processing basic motor commands this session due to fatigue, low initiation and decreased insight into benefits of light therapy tasks                 Following commands: Impaired Following commands impaired: Follows one step commands with increased time      Cueing   Cueing Techniques: Verbal cues  Exercises Exercises: Other exercises (issued foam grasp ball for 20 reps Bly TID and yellow tband 10 reps BID for triceps press, wife with + teach back to encourage and written on white board for carryover.)       General Comments vitals pre- BP148/53, HR 101, SpO2 98%, RR 29; EOB after 3-4 minutes BP 111/76, HR 110, RR 33, SpO2 94%; post in chair position BP 148/92, HR 96 RR 28, SpO2 96%    Pertinent Vitals/ Pain       Pain Assessment Pain Assessment: Faces Faces Pain Scale: Hurts a little bit Pain Location: generalized discomfort voiced no specific region Pain Descriptors / Indicators: Grimacing Pain Intervention(s): Limited activity within patient's  tolerance, Monitored during session, Repositioned   Frequency  Min 2X/week        Progress Toward Goals  OT Goals(current goals can now be found in the care plan section)  Progress towards OT goals: Progressing toward goals  Acute Rehab OT Goals Patient Stated Goal: wife- to be more moilie OT Goal Formulation: With patient/family Time For Goal Achievement: 11/28/23 Potential to Achieve Goals: Fair ADL Goals Pt Will Perform Lower Body Bathing: with min assist;sit to/from stand Pt Will Perform Lower Body Dressing: with min assist;sit to/from stand Pt Will Transfer to Toilet: with min assist;bedside commode Pt Will Perform Toileting - Clothing Manipulation and hygiene: with min assist;sit to/from stand Pt/caregiver will Perform Home Exercise Program: Increased strength;Both right and left upper extremity;With written HEP provided;With Supervision Additional ADL Goal #1: Patient will integrate simple ECT into BADL's and mobility with min cues  Plan      Co-evaluation    PT/OT/SLP Co-Evaluation/Treatment: Yes Reason for Co-Treatment: Complexity of the patient's impairments (multi-system involvement) PT goals addressed during session: Mobility/safety with mobility;Balance;Proper use of DME OT goals addressed during session: ADL's and self-care;Proper use of Adaptive equipment and DME      AM-PAC OT 6 Clicks Daily Activity     Outcome Measure   Help from another person eating meals?: Total (NPO except ice chips) Help from another person taking care of personal grooming?: A Lot Help from another person toileting, which includes using toliet, bedpan, or  urinal?: Total Help from another person bathing (including washing, rinsing, drying)?: Total Help from another person to put on and taking off regular upper body clothing?: A Lot Help from another person to put on and taking off regular lower body clothing?: Total 6 Click Score: 8    End of Session Equipment Utilized During  Treatment: Gait belt  OT Visit Diagnosis: Unsteadiness on feet (R26.81);Muscle weakness (generalized) (M62.81);Feeding difficulties (R63.3);Cognitive communication deficit (R41.841)   Activity Tolerance Patient limited by fatigue   Patient Left in bed;with call bell/phone within reach;with bed alarm set;Other (comment) (chair position)   Nurse Communication Mobility status;Precautions;Other (comment) (VSR EOB trial, request for ACE wraps on LE's and abdominal binder)        Time: 0940-1010 OT Time Calculation (min): 30 min  Charges: OT General Charges $OT Visit: 1 Visit OT Treatments $Therapeutic Activity: 8-22 mins  Villa Burgin OT/L Acute Rehabilitation Department  [phone]  11/28/2023, 11:38 AM

## 2023-11-28 NOTE — Plan of Care (Signed)
  Problem: Education: Goal: Knowledge of General Education information will improve Description: Including pain rating scale, medication(s)/side effects and non-pharmacologic comfort measures Outcome: Progressing   Problem: Health Behavior/Discharge Planning: Goal: Ability to manage health-related needs will improve Outcome: Progressing   Problem: Clinical Measurements: Goal: Ability to maintain clinical measurements within normal limits will improve Outcome: Progressing Goal: Will remain free from infection Outcome: Progressing Goal: Diagnostic test results will improve Outcome: Progressing Goal: Respiratory complications will improve Outcome: Progressing Goal: Cardiovascular complication will be avoided Outcome: Progressing   Problem: Activity: Goal: Risk for activity intolerance will decrease Outcome: Progressing   Problem: Nutrition: Goal: Adequate nutrition will be maintained Outcome: Progressing   Problem: Coping: Goal: Level of anxiety will decrease Outcome: Progressing   Problem: Elimination: Goal: Will not experience complications related to bowel motility Outcome: Progressing Goal: Will not experience complications related to urinary retention Outcome: Progressing   Problem: Pain Managment: Goal: General experience of comfort will improve and/or be controlled Outcome: Progressing   Problem: Safety: Goal: Ability to remain free from injury will improve Outcome: Progressing   Problem: Skin Integrity: Goal: Risk for impaired skin integrity will decrease Outcome: Progressing   Problem: Nutrition Goal: Patient maintains adequate hydration Outcome: Progressing Goal: Patient will verbalize dietary restrictions Outcome: Progressing   Problem: Education: Goal: Ability to describe self-care measures that may prevent or decrease complications (Diabetes Survival Skills Education) will improve Outcome: Progressing Goal: Individualized Educational  Video(s) Outcome: Progressing   Problem: Fluid Volume: Goal: Ability to maintain a balanced intake and output will improve Outcome: Progressing   Problem: Health Behavior/Discharge Planning: Goal: Ability to identify and utilize available resources and services will improve Outcome: Progressing Goal: Ability to manage health-related needs will improve Outcome: Progressing   Problem: Metabolic: Goal: Ability to maintain appropriate glucose levels will improve Outcome: Progressing   Problem: Nutritional: Goal: Maintenance of adequate nutrition will improve Outcome: Progressing Goal: Progress toward achieving an optimal weight will improve Outcome: Progressing   Problem: Skin Integrity: Goal: Risk for impaired skin integrity will decrease Outcome: Progressing   Problem: Tissue Perfusion: Goal: Adequacy of tissue perfusion will improve Outcome: Progressing

## 2023-11-28 NOTE — Progress Notes (Signed)
 [name] 5:57 PM  Subjective: Patient doing about the same any new complaints and again we answered all of the wife's questions and in the last 3 hours has had increased NG output but it had been less earlier in the day unfortunately the pharmacy people have told me that they have run out of her erythromycin  IV and it might be good to see if it makes a difference and we stop it as compared to give it orally or through the NG tube and hold suction for an hour or 2 Objective: Vital signs stable afebrile no acute distress abdomen is flat nontender soft K3.5  Assessment: Prolonged ileus questionable etiology I have concerns about amyloid  Plan: Will proceed with endoscopy and possible enteroscope not so much to rule out obstruction but to biopsy to assess amyloid and the wife and patient agree with the plan will check on tomorrow  [facility]  office [phone] After 5PM or if no answer call [phone]

## 2023-11-28 NOTE — Progress Notes (Signed)
 PHARMACY - TOTAL PARENTERAL NUTRITION CONSULT NOTE   Indication: Bowel Obstruction  Patient Measurements: Height: 6' 4 (193 cm) Weight: 81.4 kg (179 lb 7.3 oz) IBW/kg (Calculated) : 86.8 TPN AdjBW (KG): 94.6 Body mass index is 21.84 kg/m. Usual Weight:   Assessment:  Pharmacy is consulted to start TPN on 73 yo male diagnosed with bowel obstruction. This admission CT abdomen shows increasing small bowel dilatation and fecalization of bowel contents involving the jejunum. No definitive transition point is identified at this time but caliber change is noted in the right mid abdomen at the junction of the jejunum and ileum. The more distal ileum is unremarkable. Some suspicion that bowel obstruction may be related to bortezomib  treatment pt receiving for multiple myeloma.   Glucose / Insulin : no hx of DM - CBG 119-136 - 3 units of insulin  given  Electrolytes: Na up to 147, K improved to 3.5, CO2 elevated at 35, CorrCa 10 Renal: SCr < 1, BUN increasing to 28 Hepatic: LFTs elevated.  Tbili, alk phos, WNL.  Albumin  low at 2 Intake / Output; MIVF:  - Output: stool 75mL, NG 4150 mL, urine 2100 mL   - Intake: NS at [facility], no enteral intake - Mediations:  Erythromycin  500mg  IV q6h GI Imaging: -  Admitted from 10/14/23-10/20/23 for symptoms related to small bowel obstruction (transition point suspected in the right hemi-abdomen on CT imaging) and received non-operative management.  - Admitted 11/02/23 - 11/05/2023 for small bowel dilatation/concern for small bowel obstruction. CT imaging showed persistent small bowel dilatation with mild transition point noted deep in pelvis. He went to OR 11/03/23, findings were suspicious for intermittent volvulus of small bowel to explain his symptoms and CT imaging.  - 8/10  CT abdomen shows increasing small bowel dilatation and fecalization of bowel contents involving the jejunum. No definitive transition point is identified at this time but caliber change is noted in  the right mid abdomen at the junction of the jejunum and ileum.  GI Surgeries / Procedures:  8/1:  suspicious for intermittent volvulus of small bowel to explain his symptoms and CT imaging.  Central access:  PICC 8/18  Nutritional Goals: Goal TPN rate is 100  mL/hr (provides 132 g of protein and 2438 kcals per day)  RD Assessment:  Estimated Needs Total Energy Estimated Needs: 2400-2600 kcals Total Protein Estimated Needs: 120-135 grams Total Fluid Estimated Needs: >/= 2.4L  Current Nutrition:  NPO and TPN  Plan:  Now: KCl 10mEq IV x3 runs  At 18:00 Continue TPN at 100 mL/hr, target rate  Electrolytes in TPN:  Na 75 mEq/L (dec) K 80 mEq/L (inc) Ca 5 mEq/L Mg 10 mEq/L Phos 15 mmol/L Cl:Ac Max Cl Add standard MVI and trace elements to TPN Continue q8h Sensitive SSI and adjust as needed.  Thiamine  100 mg IV daily x 5 days completed on  8/22  MIVF at  [facility] mL/hr   Monitor TPN labs on Mon/Thurs. Bmet, mag, phos in AM.      Wanda Hasting PharmD, BCPS [facility] main pharmacy [phone] 11/28/2023 7:24 AM

## 2023-11-28 NOTE — Progress Notes (Signed)
 Physical Therapy Treatment Patient Details Name: David Mcgrath MRN: [id number] DOB: 11-18-1950 Today's Date: 11/28/2023   History of Present Illness Pt is 73 yo male who presented on 11/12/23 with nausea, vomiting, diarrhea, hypotension.  CT raised concern for ileus vs partial SBO.  Pt recently admitted 7/31-8/3 for SBO, pt underwent diagnostic laparoscopy on 8/1.  This admission pt with persistent ileus, treated with NG tube 8/16 and replaced 8/21.  Surgery consulted and did not feel repeat laparoscopy beneficial.  TPN started 8/18. GI reconsulted for possible EGD but continuing erythromycin  for now. Pt also with hypotension, urinary retention, PAF, hyponatremia/hypokalemia/hypomagnesemia/hypoglycemia.  Pt also with LE edema - findings consistent with chronic DVT.  Pt with hx including but not limited to  hypertension, hyperlipidemia, paroxysmal A-fib on Eliquis , chronic anxiety/depression, polyneuropathy, multiple myeloma (diagnosed March 2025) received Bortezomib  infusion 11/09/2023.    PT Comments   The patient is willing t sit onto bed edge but did not want to get into a recliner/  Patient slowly  transitioned to sitting propped on HOB and legs over bed edge, transitioned to  upright  for only afew seconds. Patient reporting dizziness through out and asked to return to supine. Patient left in semi bed/chair position with BP stable  BP supine 148/63, sitting 111/76, return to supine 148/92. HR 90-114. RR 40's Sat on Ra  1005. Plan to try ACE wraps to legs prior  to theapy. Patient will benefit from continued inpatient follow up therapy, <3 hours/day    If plan is discharge home, recommend the following: Assistance with cooking/housework;Help with stairs or ramp for entrance;Assist for transportation;Two people to help with walking and/or transfers;A lot of help with bathing/dressing/bathroom   Can travel by private vehicle     No  Equipment Recommendations  None recommended by PT     Recommendations for Other Services       Precautions / Restrictions Precautions Precautions: Fall Recall of Precautions/Restrictions: Intact Precaution/Restrictions Comments: NG tube, flatus pouch, watch BP, try ACE wraps MD said no to  abd binder Restrictions Weight Bearing Restrictions Per Provider Order: No     Mobility  Bed Mobility   Bed Mobility: Rolling, Sidelying to Sit, Sit to Supine Rolling: Min assist Sidelying to sit: Mod assist, HOB elevated, Used rails   Sit to supine: +2 for safety/equipment, +2 for physical assistance, Max assist   General bed mobility comments: slow position changs, cues, use of bed features but became symptomatic with drop in BP in supported sidelying almost to sit, returned back to bed and then patient tolerated chair position in ICU bed    Transfers                        Ambulation/Gait                   Stairs             Wheelchair Mobility     Tilt Bed    Modified Rankin (Stroke Patients Only)       Balance Overall balance assessment: Needs assistance   Sitting balance-Leahy Scale: Poor Sitting balance - Comments: support needed this session with bed features due to hypotensive and poor tolerance to progress fully upright                                    Communication Communication Communication: No apparent difficulties  Cognition Arousal: Alert Behavior During Therapy: Anxious, Flat affect   PT - Cognitive impairments: No apparent impairments                       PT - Cognition Comments: increased time to respond at times; pt  stated that he did not want to get into a chair Following commands: Impaired Following commands impaired: Follows one step commands with increased time    Cueing    Exercises      General Comments General comments (skin integrity, edema, etc.):      Pertinent Vitals/Pain Pain Assessment Faces Pain Scale: Hurts a little bit Pain  Location: generalized discomfort voiced no specific region Pain Descriptors / Indicators: Grimacing Pain Intervention(s): Monitored during session    Home Living                          Prior Function            PT Goals (current goals can now be found in the care plan section) Progress towards PT goals: Progressing toward goals    Frequency           PT Plan      Co-evaluation PT/OT/SLP Co-Evaluation/Treatment: Yes Reason for Co-Treatment: Complexity of the patient's impairments (multi-system involvement) PT goals addressed during session: Mobility/safety with mobility;Balance;Proper use of DME OT goals addressed during session: ADL's and self-care;Proper use of Adaptive equipment and DME      AM-PAC PT 6 Clicks Mobility   Outcome Measure  Help needed turning from your back to your side while in a flat bed without using bedrails?: A Little Help needed moving from lying on your back to sitting on the side of a flat bed without using bedrails?: A Little Help needed moving to and from a bed to a chair (including a wheelchair)?: Total Help needed standing up from a chair using your arms (e.g., wheelchair or bedside chair)?: Total Help needed to walk in hospital room?: Total Help needed climbing 3-5 steps with a railing? : Total 6 Click Score: 10    End of Session   Activity Tolerance: Patient limited by fatigue Patient left: with call bell/phone within reach;in bed;with bed alarm set Nurse Communication: Mobility status;Other (comment) PT Visit Diagnosis: Unsteadiness on feet (R26.81);Other abnormalities of gait and mobility (R26.89);Muscle weakness (generalized) (M62.81)     Time: 9054-8989 PT Time Calculation (min) (ACUTE ONLY): 25 min  Charges:    $Therapeutic Activity: 8-22 mins PT General Charges $$ ACUTE PT VISIT: 1 Visit                     Darice Potters PT Acute Rehabilitation Services Office [phone]    Potters Darice Norris 11/28/2023, 12:55 PM

## 2023-11-28 NOTE — Progress Notes (Signed)
 PROGRESS NOTE    David Mcgrath  FMW:[id number] DOB: [birth year] DOA: 11/12/2023 PCP: [name], DO   Brief Narrative:  73 y.o. male with medical history significant for hypertension, hyperlipidemia, paroxysmal A-fib on Eliquis , chronic anxiety/depression, polyneuropathy, multiple myeloma (diagnosed March 2025) received Bortezomib  infusion 11/09/2023. Patient was admitted here 11/02/2023 through 11/05/2023 with intractable nausea vomiting, recurrent small bowel obstruction.  He was treated conservatively with NG tube decompression and discharged home.    Last admission CT abdomen/pelvis with contrast with slight improvement in small bowel dilation when compared to prior exam but with mild transition point deep in the pelvis, no pancreatic ductal dilation, no inflammatory changes, stable free fluid within the abdomen.  General surgery was consulted and patient underwent diagnostic laparoscopy by Dr. [name] on 11/03/2023 findings of intermittently dilated small bowel with erythema of the serosa consistent with inflammation/edema, intermittent injection of the small bowel mesentery, small bowel with tendency to migrate to the left upper quadrant with long/mobile small bowel mesentery with suspicion/potential for intermittent volvulized small bowel contributing to his symptoms and findings on imaging.  Diet was slowly advanced with toleration.    This admission CT abdomen shows increasing small bowel dilatation and fecalization of bowel contents involving the jejunum. No definitive transition point is identified at this time but caliber change is noted in the right mid abdomen at the junction of the jejunum and ileum. The more distal ileum is unremarkable. Stable ground-glass opacities in the lung bases similar to that seen on prior exams.   **Interim History Has had a persistent ileus and continues  to have gastric distention. Unfortunately lost his NG tube on 8/21 but had to be replaced by IR.  Now  connected back to [facility].  Underwent a upper GI series with small bowel follow-through and this shows contrast in the stomach with delayed gastric emptying.  Subsequently there was contrast in the rectum suggesting no complete obstruction.  GI recommending continuing erythromycin  for now and repeating abdominal x-rays and considering EGD early next week (Monday vs Tuesday) to rule out pyloric stricture and recommending ambulation.  8/25: Patient was admitted on 8/10 and I assumed care today on 8/25.  Please review prior notes.  Most of the stuff carried on for continued of care.  Further management as below.  Assessment/Plan:   # Ileus versus partial small bowel obstruction: Patient presented with nausea vomiting and diarrhea.  CT scan raise concern for ileus versus partial SBO. Patient was given symptomatic treatment.  NGT was not placed initially Patient initially started improving but then had recurrence of his nausea and vomiting.  CT scan was repeated which showed persistent small bowel dilatation with prominent jejunum as seen before. -Minimal ambulation due to his weakness also -Trial of erythromycin  started on 11/18/2023 but not successful; began having large biliary emesis on 8/16; Allergic to Reglan  so will resume and retrial Erythromycin  again (IV) and increased dose to 500 mg TID  -NGT placed 8/16 and had to be replaced 8/21; continue NPO, IVF for now -Surgery to discuss if need for further repeat laparoscopy and they do not feel like it will be beneficial; Given prolonged hospitalization with minimal to no nutrition, now warrants TPN; PICC placed 8/18 and TPN started;   -Underwent UGI w/ SBFT per Surgery to assess for transit and patency and this was done and showed Contrast present in stomach with delayed gastric emptying. Minimize narcotics. C/w Antiemetics. GI feels it may take Days/weeks for this issue to resolve -Repeat KUB  8/22 AM showed  Minimal bowel contrast material is  demonstrated. There appears to be contrast material in the rectum suggesting no evidence of complete obstruction.Gaseous distention of small bowel with gas-filled colon most likely ileus. -Repeat KUB this AM showed similar Diffuse gaseous distension of bowel in the abdomen and pelvis to prior films. -GI re-consulted and considering EGD early next week (Monday or Tuesday) to rule out any pyloric stricture but no obstructive lesion seen on the upper GI small bowel follow-through. GI recommended continue erythromycin  for now continue ambulation and mobilization to try and stimulate gastric intestinal motility and avoiding narcotics. 8/25: Increase erythromycin  every 6 hourly and plan is for EGD with push enteroscopy for amyloid due to his multiple myeloma.  Normalize electrolytes. 8/26 started PPI bid for prophylaxis  Hypotension - Resolved w/ IVF Lactic acidosis - resolved  Leukocytosis, fluctuating but now resolved - large GI losses on 8/16 with emesis episode, then became hypotensive and lactic elevated - s/p fluid boluses overnight and started on maintenance fluid -Lactic now normalized 8/17; low suspicion for infection; this appears hypovolemia for now -s/p IVF -Continue trending WBC; Current Trend Fluctuating but improved and WBC is 5.9. Holding off on abx at this time 8/26 noticed orthostatic hypotension while working with PT/OT.  Advised ace wraps/TED hose while working with PT    Diarrhea: Reports semiformed stools.GI pathogen panel negative; Colonized with C. difficile but testing negative for active infection (antigen positive, toxin negative, PCR negative). Fecal Pouch in place for Diarrhea   Acute Urinary Retention: Foley catheter was placed on 8/11.  Voiding trial once he is more stable and/or stronger   History of Multiple Myeloma: Followed by medical oncology, Dr. Federico.  Some of his symptoms are thought to be due to his chemotherapeutic medications. Will re-engage Oncology to see  if they have any further reccs   PAF:  Eliquis  was currently on hold in case he needs surgical intervention and remainson Heparin  gtt. Currently not on any rate limiting medications.  Home medication list reviewed.  He is on metoprolol  but only as needed. Continue to monitor on telemetry. -C/w heparin  while NPO and while off eliquis ; Continue for now given NPO  Left Fingers Discoloration:  Resolved and Improved. Noticed on 8/18 but wife states was present on 8/17 some and actually was improved on 8/18; Fingers improved 8/20. Concern was for cyanosis as fingertips were also cold. -Had PICC line placed in left arm on 8/18 however wife states discoloration was present prior; there was also some extravasation from a prior IV in the left arm as well but unclear what was transfusing as happened prior to transfer to the ICU -Left Arterial Doppler ordered and shows no obstruction in the left upper extremity with triphasic waveforms; Also evaluated by vascular surgery, greatly appreciate assistance and no Surgical intervention needed. Finger discoloration has resolved as of 8/19;  Okay to discontinue neurovascular checks. Continue monitoring as necessary   Normocytic Anemia:  Hgb/Hct Trended down and is now 8.5/26.1 w/ MCV of 94.6. CTM for S/Sx of Bleeding; No overt bleeding noted. Stable hemoglobin noted and will need repeat CBC in the AM    Hyponatremia/Hypokalemia/Hypomagnesemia: Na+ is now 138, K+ is now 3.8, Mag is now 2.3. On TPN; Sodium level remains intermittently low.  There could be an element of SIADH but currently all Electrolytes stable. CTM  8/25 k 3.3, 40 mEq K IV given    Hypoglycemia: Probably from poor oral intake. On D5 infusion in LR @ 10  mL/hr with improvement in glucose levels. Current CBG Trend ranging from 94-122  Abnormal LFTs: IN the setting of TPN and worsening. AST is now 73 -> 73 and ALT is now 64 -> 114.  CTM and Trend and if Necessary will obtain RUQ U/S; GI is checking an Acute  Hepatitis Panel   Chronic Anxiety and Depression: Continue psychotropic medications once taking po Properly but currently has NGT to Suction     Physical Deconditioning: PT and OT recommendinfg SNF when medically stable for D/C but not medically ready yet.  Hypoalbuminemia: Patient's Albumin  Trending down and went from 3.0 -> 1.6 -> 1.8. CTM and Trend and repeat CMP in the AM:   Lower Extremity Swelling: Lower extremity duplex to rule out DVT and this showed Findings consistent with chronic deep vein thrombosis involving the right and left common femoral vein.  Findings are also consistent with age-indeterminate superficial vein thrombosis in the left small saphenous vein and no cystic structures found in the right and left popliteal fossa; Will give a dose of IV Lasix  40 mg x1 again. On Anticoagulation as above  Overweight: Complicates overall prognosis and care. Estimated body mass index is 22.43 kg/m as calculated from the following:   Height as of this encounter: 6' 4 (1.93 m).   Weight as of this encounter: 83.6 kg. Weight Loss and Dietary Counseling given   DVT prophylaxis: Place and maintain sequential compression device Start: 11/16/23 1142    Code Status: Full Code Family Communication: D/w Wife @ bedside   Disposition Plan:  Level of care: Stepdown Status is: Inpatient Remains inpatient appropriate because: Need to have improvement in his ileus and gastric dysmotility and will need to tolerate a diet w/o TPN and go to SNF once medically stable    Consultants:  Gastroenterology Interventional radiology Vascular Surgery General Surgery  Procedures:  As delineated as above  Antimicrobials:  Anti-infectives (From admission, onward)    Start     Dose/Rate Route Frequency Ordered Stop   11/27/23 1200  erythromycin  500 mg in sodium chloride  0.9 % 100 mL IVPB        500 mg 100 mL/hr over 60 Minutes Intravenous Every 6 hours 11/27/23 0933     11/24/23 1400  erythromycin  500  mg in sodium chloride  0.9 % 100 mL IVPB  Status:  Discontinued        500 mg 100 mL/hr over 60 Minutes Intravenous Every 8 hours 11/24/23 1027 11/27/23 0933   11/23/23 2000  erythromycin  250 mg in sodium chloride  0.9 % 100 mL IVPB  Status:  Discontinued        250 mg 100 mL/hr over 60 Minutes Intravenous Every 8 hours 11/23/23 1744 11/24/23 1027   11/23/23 1200  erythromycin  (E-MYCIN ) tablet 250 mg  Status:  Discontinued        250 mg Oral 3 times daily before meals 11/23/23 1004 11/23/23 1744   11/18/23 1200  erythromycin  (E-MYCIN ) tablet 250 mg  Status:  Discontinued        250 mg Oral 2 times daily with meals 11/18/23 0720 11/19/23 0734   11/13/23 1000  acyclovir  (ZOVIRAX ) tablet 400 mg  Status:  Discontinued        400 mg Oral 2 times daily 11/13/23 0503 11/19/23 0739       Subjective: Patient was seen and examined at bedside today morning. Patient still has NG tube with LIS and significant amount of fluid is coming out.  Patient denied any abdominal pain, no  BM but passing gas. Resting comfortably, denied any active issues   Objective: Vitals:   11/28/23 0800 11/28/23 0900 11/28/23 1000 11/28/23 1100  BP: (!) 147/52 (!) 148/53 132/84 134/78  Pulse: 98 97 (!) 105 (!) 103  Resp: (!) 28 (!) 30 (!) 33 (!) 35  Temp: 97.7 F (36.5 C)     TempSrc: Oral     SpO2: 96% 98% 97% 97%  Weight:      Height:        Intake/Output Summary (Last 24 hours) at 11/28/2023 1217 Last data filed at 11/28/2023 1117 Gross per 24 hour  Intake 3780.34 ml  Output 5525 ml  Net -1744.66 ml   Filed Weights   11/26/23 0446 11/27/23 0500 11/28/23 0500  Weight: 83.6 kg 85.1 kg 81.4 kg   Examination: Physical Exam:  Constitutional: WN/WD overweight elderly Caucasian male who is chronically ill-appearing and appears uncomfortable Respiratory: Equal air entry bilaterally, no crackles or wheezes  Cardiovascular: RRR, no murmurs / rubs / gallops. S1 and S2 auscultated.  Mild lower extremity  edema Abdomen: Soft, non-tender, distended secondary to body habitus. Bowel sounds positive.  GU: Deferred. Skin: No rashes, lesions, ulcers on limited skin evaluation. No induration; Warm and dry.  Neurologic: Grossly intact, no focal deficits  Psychiatric: Normal judgment and insight. Alert and oriented x 3.  Frustrated and little depressed mood and affect  Data Reviewed: I have personally reviewed following labs and imaging studies  CBC: Recent Labs  Lab 11/23/23 0407 11/24/23 0401 11/25/23 0523 11/26/23 0440 11/27/23 0510  WBC 8.7 13.1* 7.0 5.9 5.9  NEUTROABS 7.3 11.3* 5.2 4.3 4.3  HGB 8.8* 9.7* 8.5* 8.5* 8.5*  HCT 27.6* 30.6* 26.1* 26.1* 27.0*  MCV 95.2 95.0 94.6 94.6 96.1  PLT 183 195 186 204 249   Basic Metabolic Panel: Recent Labs  Lab 11/23/23 0407 11/24/23 0401 11/25/23 0523 11/26/23 0440 11/27/23 0510 11/28/23 0504  NA 136 132* 135 138 143 147*  K 4.3 4.4 4.4 3.8 3.3* 3.5  CL 103 99 99 96* 101 104  CO2 27 28 31  33* 36* 35*  GLUCOSE 120* 114* 114* 127* 132* 129*  BUN 15 16 20 23  26* 28*  CREATININE 0.47* 0.41* 0.53* 0.57* 0.53* 0.58*  CALCIUM  7.6* 7.8* 7.9* 8.1* 8.2* 8.4*  MG 2.0 1.9 2.2 2.3 2.3  --   PHOS 3.3 2.8 3.8 4.9* 4.6  --    GFR: Estimated Creatinine Clearance: 96.1 mL/min (A) (by C-G formula based on SCr of 0.58 mg/dL (L)). Liver Function Tests: Recent Labs  Lab 11/23/23 0407 11/24/23 0401 11/25/23 0523 11/26/23 0440 11/27/23 0510  AST 19 32 73* 73* 50*  ALT 19 27 64* 114* 94*  ALKPHOS 56 68 70 80 94  BILITOT 0.2 0.4 0.4 0.4 0.4  PROT 4.4* 5.1* 4.8* 5.1* 5.4*  ALBUMIN  1.7* 1.8* 1.6* 1.8* 2.0*   No results for input(s): LIPASE, AMYLASE in the last 168 hours. No results for input(s): AMMONIA in the last 168 hours. Coagulation Profile: No results for input(s): INR, PROTIME in the last 168 hours. Cardiac Enzymes: No results for input(s): CKTOTAL, CKMB, CKMBINDEX, TROPONINI in the last 168 hours. BNP (last 3  results) No results for input(s): PROBNP in the last 8760 hours. HbA1C: No results for input(s): HGBA1C in the last 72 hours. CBG: Recent Labs  Lab 11/27/23 0513 11/27/23 0823 11/27/23 1531 11/28/23 0007 11/28/23 0801  GLUCAP 119* 126* 125* 136* 128*   Lipid Profile: Recent Labs    11/27/23 0510  TRIG 106   Thyroid  Function Tests: No results for input(s): TSH, T4TOTAL, FREET4, T3FREE, THYROIDAB in the last 72 hours. Anemia Panel: No results for input(s): VITAMINB12, FOLATE, FERRITIN, TIBC, IRON, RETICCTPCT in the last 72 hours. Sepsis Labs: No results for input(s): PROCALCITON, LATICACIDVEN in the last 168 hours.  Recent Results (from the past 240 hours)  MRSA Next Gen by PCR, Nasal     Status: None   Collection Time: 11/19/23 12:11 AM   Specimen: Nasal Mucosa; Nasal Swab  Result Value Ref Range Status   MRSA by PCR Next Gen NOT DETECTED NOT DETECTED Final    Comment: (NOTE) The GeneXpert MRSA Assay (FDA approved for NASAL specimens only), is one component of a comprehensive MRSA colonization surveillance program. It is not intended to diagnose MRSA infection nor to guide or monitor treatment for MRSA infections. Test performance is not FDA approved in patients less than 13 years old. Performed at [facility], [address], [place], [place] [address]     Radiology Studies: VAS US  LOWER EXTREMITY VENOUS (DVT) Result Date: 11/27/2023  Lower Venous DVT Study Patient Name:  Viktor Philipp Mcgrath  Date of Exam:   11/26/2023 Medical Rec #: [id number]                Accession #:    [id number] Date of Birth: [birth year]                Patient Gender: M Patient Age:   72 years Exam Location:  [facility] Procedure:      VAS US  LOWER EXTREMITY VENOUS (DVT) Referring Phys: ALEJANDRO MARKER --------------------------------------------------------------------------------  Indications: Swelling, and Edema.  Limitations: Bandages  and urinary catheter is attached to left thigh. Performing Technologist: Elmarie Lindau, RVT  Examination Guidelines: A complete evaluation includes B-mode imaging, spectral Doppler, color Doppler, and power Doppler as needed of all accessible portions of each vessel. Bilateral testing is considered an integral part of a complete examination. Limited examinations for reoccurring indications may be performed as noted. The reflux portion of the exam is performed with the patient in reverse Trendelenburg.  +---------+---------------+---------+-----------+----------+--------------+ RIGHT    CompressibilityPhasicitySpontaneityPropertiesThrombus Aging +---------+---------------+---------+-----------+----------+--------------+ CFV      Partial                                      Chronic        +---------+---------------+---------+-----------+----------+--------------+ SFJ      Full                                                        +---------+---------------+---------+-----------+----------+--------------+ FV Prox  Full                                                        +---------+---------------+---------+-----------+----------+--------------+ FV Mid   Full                                                        +---------+---------------+---------+-----------+----------+--------------+  FV DistalFull                                                        +---------+---------------+---------+-----------+----------+--------------+ PFV      Full                                                        +---------+---------------+---------+-----------+----------+--------------+ POP      Full                                                        +---------+---------------+---------+-----------+----------+--------------+ PTV      Full                                                         +---------+---------------+---------+-----------+----------+--------------+ PERO     Full                                                        +---------+---------------+---------+-----------+----------+--------------+   +---------+---------------+---------+-----------+----------+-----------------+ LEFT     CompressibilityPhasicitySpontaneityPropertiesThrombus Aging    +---------+---------------+---------+-----------+----------+-----------------+ CFV      Partial                                      Chronic           +---------+---------------+---------+-----------+----------+-----------------+ SFJ      Full                                                           +---------+---------------+---------+-----------+----------+-----------------+ FV Prox  Full                                                           +---------+---------------+---------+-----------+----------+-----------------+ FV Mid                                                not assessed      +---------+---------------+---------+-----------+----------+-----------------+ FV Distal  not assessed      +---------+---------------+---------+-----------+----------+-----------------+ PFV      Full                                                           +---------+---------------+---------+-----------+----------+-----------------+ POP      Full                                                           +---------+---------------+---------+-----------+----------+-----------------+ PTV      Full                                                           +---------+---------------+---------+-----------+----------+-----------------+ PERO     Full                                                           +---------+---------------+---------+-----------+----------+-----------------+ SSV      None                                          Age Indeterminate +---------+---------------+---------+-----------+----------+-----------------+     Summary: RIGHT: - 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  - No cystic structure found in the popliteal fossa.  LEFT: - Findings consistent with age indeterminate superficial vein thrombosis involving the left small sahenous vein.  - Findings consistent with chronic deep vein thrombosis involving the left common femoral vein.  - No cystic structure found in the popliteal fossa.  *See table(s) above for measurements and observations. Electronically signed by Fonda Rim on 11/27/2023 at 3:06:25 PM.    Final    DG Abd 1 View Result Date: 11/27/2023 CLINICAL DATA:  74 year old male with abdominal distension. Dilated small bowel on CT 11/16/2023. Right abdominal transition point suspected. EXAM: ABDOMEN - 1 VIEW COMPARISON:  Portable radiographs yesterday and earlier. FINDINGS: Portable AP supine view at 0452 hours. Enteric tube terminates in the left upper quadrant, satisfactory at the level of the stomach. Bowel gas pattern not significantly changed from yesterday and dilated gas containing small bowel loops in the right abdomen, right upper quadrant persist. Stable lung bases. There is large bowel gas present including in the pelvis, rectum. No dilated large bowel. Stable visualized osseous structures. IMPRESSION: 1. Stable enteric tube. 2. No improvement in bowel gas pattern: dilated gas containing small bowel loops in the right abdomen. Nondilated gas containing large bowel including in the rectum. Constellation favors a persistent partial small bowel obstruction. Electronically Signed   By: VEAR Hurst M.D.   On: 11/27/2023 08:48   Scheduled Meds:  butamben -tetracaine -benzocaine   1 spray Topical Once   Chlorhexidine  Gluconate Cloth  6 each Topical Daily  fluticasone   1 spray Each Nare Daily   insulin  aspart  0-9 Units Subcutaneous Q8H   sodium  chloride flush  10-40 mL Intracatheter Q12H   Continuous Infusions:  sodium chloride  10 mL/hr at 11/28/23 1117   erythromycin  500 mg (11/28/23 1212)   heparin  2,150 Units/hr (11/28/23 1117)   potassium chloride  Stopped (11/28/23 1216)   TPN ADULT (ION) 100 mL/hr at 11/28/23 1117   TPN ADULT (ION)      LOS: 15 days   Elvan Sor, MD [facility] Available via Epic secure chat 7am-7pm After these hours, please refer to coverage provider listed on [email or web address] 11/28/2023, 12:17 PM

## 2023-11-29 ENCOUNTER — Ambulatory Visit

## 2023-11-29 ENCOUNTER — Other Ambulatory Visit

## 2023-11-29 ENCOUNTER — Ambulatory Visit: Admitting: Physician Assistant

## 2023-11-29 DIAGNOSIS — E785 Hyperlipidemia, unspecified: Secondary | ICD-10-CM | POA: Diagnosis not present

## 2023-11-29 DIAGNOSIS — R7401 Elevation of levels of liver transaminase levels: Secondary | ICD-10-CM

## 2023-11-29 DIAGNOSIS — M7989 Other specified soft tissue disorders: Secondary | ICD-10-CM

## 2023-11-29 DIAGNOSIS — E870 Hyperosmolality and hypernatremia: Secondary | ICD-10-CM

## 2023-11-29 DIAGNOSIS — K567 Ileus, unspecified: Secondary | ICD-10-CM | POA: Diagnosis not present

## 2023-11-29 DIAGNOSIS — C9000 Multiple myeloma not having achieved remission: Secondary | ICD-10-CM | POA: Diagnosis not present

## 2023-11-29 DIAGNOSIS — R338 Other retention of urine: Secondary | ICD-10-CM

## 2023-11-29 DIAGNOSIS — D72829 Elevated white blood cell count, unspecified: Secondary | ICD-10-CM

## 2023-11-29 DIAGNOSIS — E876 Hypokalemia: Secondary | ICD-10-CM | POA: Diagnosis not present

## 2023-11-29 LAB — CBC
HCT: 27.4 % — ABNORMAL LOW (ref 39.0–52.0)
Hemoglobin: 8.1 g/dL — ABNORMAL LOW (ref 13.0–17.0)
MCH: 29.8 pg (ref 26.0–34.0)
MCHC: 29.6 g/dL — ABNORMAL LOW (ref 30.0–36.0)
MCV: 100.7 fL — ABNORMAL HIGH (ref 80.0–100.0)
Platelets: 279 K/uL (ref 150–400)
RBC: 2.72 MIL/uL — ABNORMAL LOW (ref 4.22–5.81)
RDW: 19.4 % — ABNORMAL HIGH (ref 11.5–15.5)
WBC: 6.9 K/uL (ref 4.0–10.5)
nRBC: 0 % (ref 0.0–0.2)

## 2023-11-29 LAB — BASIC METABOLIC PANEL WITH GFR
Anion gap: 9 (ref 5–15)
BUN: 33 mg/dL — ABNORMAL HIGH (ref 8–23)
CO2: 27 mmol/L (ref 22–32)
Calcium: 8.5 mg/dL — ABNORMAL LOW (ref 8.9–10.3)
Chloride: 114 mmol/L — ABNORMAL HIGH (ref 98–111)
Creatinine, Ser: 0.56 mg/dL — ABNORMAL LOW (ref 0.61–1.24)
GFR, Estimated: >60 mL/min (ref >60)
Glucose, Bld: 122 mg/dL — ABNORMAL HIGH (ref 70–99)
Potassium: 4.2 mmol/L (ref 3.5–5.1)
Sodium: 149 mmol/L — ABNORMAL HIGH (ref 135–145)

## 2023-11-29 LAB — GLUCOSE, CAPILLARY
Glucose-Capillary: 109 mg/dL — ABNORMAL HIGH (ref 70–99)
Glucose-Capillary: 111 mg/dL — ABNORMAL HIGH (ref 70–99)
Glucose-Capillary: 119 mg/dL — ABNORMAL HIGH (ref 70–99)
Glucose-Capillary: 123 mg/dL — ABNORMAL HIGH (ref 70–99)

## 2023-11-29 LAB — HEPARIN LEVEL (UNFRACTIONATED)
Heparin Unfractionated: 0.78 [IU]/mL — ABNORMAL HIGH (ref 0.30–0.70)
Heparin Unfractionated: 0.41 [IU]/mL (ref 0.30–0.70)

## 2023-11-29 LAB — MAGNESIUM: Magnesium: 2.5 mg/dL — ABNORMAL HIGH (ref 1.7–2.4)

## 2023-11-29 LAB — PHOSPHORUS: Phosphorus: 3.5 mg/dL (ref 2.5–4.6)

## 2023-11-29 MED ORDER — DEXTROSE 5 % IV SOLN: INTRAVENOUS | Status: AC

## 2023-11-29 MED ORDER — OXYMETAZOLINE HCL 0.05 % NA SOLN
1.0000 | Freq: Two times a day (BID) | NASAL | Status: AC
  Administered 2023-11-29: 1 via NASAL
  Filled 2023-11-29: qty 15

## 2023-11-29 MED ORDER — TRAVASOL 10 % IV SOLN
INTRAVENOUS | Status: AC
  Filled 2023-11-29: qty 1320

## 2023-11-29 MED ORDER — CHLORHEXIDINE GLUCONATE CLOTH 2 % EX PADS
6.0000 | MEDICATED_PAD | Freq: Every day | CUTANEOUS | Status: DC
  Administered 2023-11-30 – 2023-12-20 (×17): 6 via TOPICAL

## 2023-11-29 NOTE — Progress Notes (Signed)
 PHARMACY - TOTAL PARENTERAL NUTRITION CONSULT NOTE   Indication: Bowel Obstruction  Patient Measurements: Height: 6' 4 (193 cm) Weight: 81.4 kg (179 lb 7.3 oz) IBW/kg (Calculated) : 86.8 TPN AdjBW (KG): 94.6 Body mass index is 21.84 kg/m. Usual Weight:   Assessment:  Pharmacy is consulted to start TPN on 73 yo male diagnosed with bowel obstruction. This admission CT abdomen shows increasing small bowel dilatation and fecalization of bowel contents involving the jejunum. No definitive transition point is identified at this time but caliber change is noted in the right mid abdomen at the junction of the jejunum and ileum. The more distal ileum is unremarkable. Some suspicion that bowel obstruction may be related to bortezomib  treatment pt receiving for multiple myeloma.   Glucose / Insulin : no hx of DM - CBG 111-128 - 3 units of insulin  given  Electrolytes: Na up to 149, Cl up to 114, Mag up to 2.5. CorrCa 10.1 - K improved s/p supplementation (monitor GI output) Renal: SCr < 1, BUN increasing to 33 Hepatic: LFTs elevated.  Tbili, alk phos, WNL.  Albumin  low at 2 Intake / Output; MIVF:  - Output: stool 40mL, NG down significantly to 1550 mL, urine 1400 mL   - Intake: D5 at 75, no enteral intake - Mediations:  Erythromycin  500mg  IV q6h 8/25-8/27 GI Imaging: -  Admitted from 10/14/23-10/20/23 for symptoms related to small bowel obstruction (transition point suspected in the right hemi-abdomen on CT imaging) and received non-operative management.  - Admitted 11/02/23 - 11/05/2023 for small bowel dilatation/concern for small bowel obstruction. CT imaging showed persistent small bowel dilatation with mild transition point noted deep in pelvis. He went to OR 11/03/23, findings were suspicious for intermittent volvulus of small bowel to explain his symptoms and CT imaging.  - 8/10  CT abdomen shows increasing small bowel dilatation and fecalization of bowel contents involving the jejunum. No  definitive transition point is identified at this time but caliber change is noted in the right mid abdomen at the junction of the jejunum and ileum.  GI Surgeries / Procedures:  8/1:  suspicious for intermittent volvulus of small bowel to explain his symptoms and CT imaging.  Central access:  PICC 8/18  Nutritional Goals: Goal TPN rate is 100  mL/hr (provides 132 g of protein and 2438 kcals per day)  RD Assessment:  Estimated Needs Total Energy Estimated Needs: 2400-2600 kcals Total Protein Estimated Needs: 120-135 grams Total Fluid Estimated Needs: >/= 2.4L  Current Nutrition:  NPO and TPN  Plan:  At 18:00 Continue TPN at 100 mL/hr, target rate  Electrolytes in TPN:  Na 0 mEq/L (dec) K 50 mEq/L (dec) Ca 2.5 mEq/L (dec) Mg 5 mEq/L (dec) Phos 15 mmol/L  Cl:Ac, change to 1:1 ratio  Add standard MVI and trace elements to TPN Continue q8h Sensitive SSI and adjust as needed.  Thiamine  100 mg IV daily x 5 days completed on  8/22  MIVF at  [facility] mL/hr   Monitor TPN labs on Mon/Thurs.     Wanda Hasting PharmD, BCPS [facility] main pharmacy [phone] 11/29/2023 7:23 AM

## 2023-11-29 NOTE — Plan of Care (Signed)
  Problem: Education: Goal: Knowledge of General Education information will improve Description: Including pain rating scale, medication(s)/side effects and non-pharmacologic comfort measures Outcome: Progressing   Problem: Pain Managment: Goal: General experience of comfort will improve and/or be controlled Outcome: Progressing   Problem: Nutrition Goal: Patient maintains adequate hydration Outcome: Progressing Goal: Patient will verbalize dietary restrictions Outcome: Progressing

## 2023-11-29 NOTE — Progress Notes (Signed)
 PROGRESS NOTE    David Mcgrath  FMW:[id number] DOB: December 06, 1950 DOA: 11/12/2023 PCP: [name], DO    Chief Complaint  Patient presents with   Weakness   Dizziness   Diarrhea         Brief Narrative:  73 y.o. male with medical history significant for hypertension, hyperlipidemia, paroxysmal A-fib on Eliquis , chronic anxiety/depression, polyneuropathy, multiple myeloma (diagnosed March 2025) received Bortezomib  infusion 11/09/2023. Patient was admitted here 11/02/2023 through 11/05/2023 with intractable nausea vomiting, recurrent small bowel obstruction.  He was treated conservatively with NG tube decompression and discharged home.     Last admission CT abdomen/pelvis with contrast with slight improvement in small bowel dilation when compared to prior exam but with mild transition point deep in the pelvis, no pancreatic ductal dilation, no inflammatory changes, stable free fluid within the abdomen.  General surgery was consulted and patient underwent diagnostic laparoscopy by Dr. [name] on 11/03/2023 findings of intermittently dilated small bowel with erythema of the serosa consistent with inflammation/edema, intermittent injection of the small bowel mesentery, small bowel with tendency to migrate to the left upper quadrant with long/mobile small bowel mesentery with suspicion/potential for intermittent volvulized small bowel contributing to his symptoms and findings on imaging.  Diet was slowly advanced with toleration.     This admission CT abdomen shows increasing small bowel dilatation and fecalization of bowel contents involving the jejunum. No definitive transition point is identified at this time but caliber change is noted in the right mid abdomen at the junction of the jejunum and ileum. The more distal ileum is unremarkable. Stable ground-glass opacities in the lung bases similar to that seen on prior exams.    **Interim History Has had a persistent ileus and continues  to  have gastric distention. Unfortunately lost his NG tube on 8/21 but had to be replaced by IR.  Now connected back to LIWS.  Underwent a upper GI series with small bowel follow-through and this shows contrast in the stomach with delayed gastric emptying.  Subsequently there was contrast in the rectum suggesting no complete obstruction.  GI recommending continuing erythromycin  for now and repeating abdominal x-rays and considering EGD early next week (Monday vs Tuesday) to rule out pyloric stricture and recommending ambulation.   8/25: Patient was admitted on 8/10 and I assumed care today on 8/25.  Please review prior notes.  Most of the stuff carried on for continued of care.  Further management as below.   Assessment & Plan:   Principal Problem:   Ileus (HCC) Active Problems:   Hypokalemia   Paroxysmal atrial fibrillation (HCC)   Multiple myeloma (HCC)   Essential hypertension   Depression   Hyperlipidemia   Pressure injury of skin   Protein-calorie malnutrition, severe  #1 ileus versus partial small bowel obstruction -Patient noted to have presented with nausea vomiting diarrhea. - CT abdomen and pelvis done concerning ileus versus partial SBO. - Patient noted to have been treated conservatively. - NG tube not initially placed. - Patient initially was improving around the hospitalization and had further recurrence of nausea and vomiting. - Repeat CT abdomen and pelvis done with persistent small bowel dilatation with prominent jejunum. - Patient started a trial with Romycin on 11/18/2023 but was not successful as patient noted to began having large biliary emesis on 11/18/2023. - Due to allergy to Reglan  patient was placed on IV erythromycin  and dose increased to 500 mg 3 times daily. - NG tube initially placed 11/18/2023 and subsequently had to  be replaced 11/23/2023. - Patient seen in consultation by general surgery and did not feel patient needed any repeat laparoscopy as deferred would  not be beneficial. - Due to patient's prolonged hospitalization minimal nutrition patient started on TPN after PICC line was placed on 11/20/2023. - Patient subsequently underwent UGI with SBFT per surgery to assess for transit and patency which showed contrast present in the stomach with delayed gastric emptying. - Narcotics are to be minimized. - Patient seen in consultation by GI review might take several days to weeks for issue to resolve. - KUB done 11/24/2023 showed minimal bowel contrast material demonstrated.  There appears to be contrast material in the rectum suggesting no evidence of complete obstruction.  Gaseous distention of small bowel.  Gas-filled colon most likely ileus. - Repeat abdominal films done with similar diffuse gaseous distention of the bowel and abdomen and pelvis compared to prior films. - GI reconsulted and following the patient for endoscopy, possible enteroscopy and small bowel biopsy to rule out amyloid with anesthesia assistance tomorrow. - GI recommended to continue erythromycin  for now, mobilization to stimulate gastric intestinal motility with narcotic avoidance. - Patient also placed on PPI twice daily.  2.  Hypotension -Resolved with hydration.  3.  Lactic acidosis -Resolved.  4.  Leukocytosis -Likely reactive, patient noted to have large GI losses on 8/16 and subsequently became hypotensive with elevated lactic acid levels. - Patient placed on IV fluids with a bolus and maintenance fluids with normalization of lactic acid on 11/19/2023. - Low suspicion for infection. - Status post IV fluids. - Continue to hold antibiotics at this time as no further leukocytosis, patient afebrile - Patient noted to have some orthostatic hypotension on 11/28/2023 while working with PT/OT and advised Ace wrap/TED hose while working with PT.  5.  Diarrhea -Patient noted to have reported semiformed stools. - GI pathogen panel negative. - Patient likely colonized with C.  difficile as testing was negative for any active infection (antigen positive, toxin negative, PCR negative). - Follow.  6.  Acute urinary retention -Foley catheter placed 11/13/2023. - When more stable and ambulating will undergo a voiding trial.  7.  History of multiple myeloma -Per Dr. Von he was found somewhat patient symptoms thought to be due to chemotherapeutic medications and oncology reengaged to see if they had any further recommendations.   -Outpatient follow-up with oncology.  8.  Paroxysmal A-fib -Not on any rate limiting medications. - Patient noted to be on metoprolol  at home but on a as needed basis. - Was on Eliquis  which currently on hold as patient for probable procedure tomorrow 11/30/2023. - Continue heparin  drip.  9.  Left finger discoloration -Resolved. - Noted to have had some discoloration of the left fingers on 8/18 however wife stated that was present on 8/17 and actually had improved on 8/18 and subsequently 8/20. - Concern was for cyanosis of his fingertips as they were also cold. - Patient noted to to have PICC line placed in the left upper extremity 8/18 however wife stated discoloration was present prior to that; there was also some extravasation from prior IV in the left arm as well but unclear what was transfusing as it happened prior to transfer to the ICU. - Left upper extremity arterial Dopplers were done with no obstruction of the left upper extremity with triphasic waves. - Patient seen in consultation by vascular surgery who felt no surgical intervention needed at this time. - Resolution of finger discoloration 8/19.  10.  Normocytic  anemia -Patient with no significant overt bleeding. - Hemoglobin stable at 8.1. - Follow H&H. - Transfusion threshold hemoglobin < 8.  11.  Hypernatremia -Likely secondary to dehydration. - Patient currently on TPN. - Placed on D5W at 75 cc an hour for the next 24 hours.  12.   Hypokalemia/hypomagnesemia/hyponatremia -Hyponatremia resolved patient now currently hyponatremic. - Patient on TPN. - Potassium now at 4.2.  Phosphorus at 3.5, magnesium  at 2.5. - Repeat labs in AM.  13.  Abnormal LFTs -Acute hepatitis panel negative. - LFTs noted to initially be trending back down. - Labs in the AM.  14.  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 - Status post IV Lasix  x 1. - Monitor lower extremity swelling with gentle hydration.  15.  Overweight -BMI of 22.43 kg/m. - Lifestyle modification - Outpatient follow-up with PCP.  16.  Severe protein calorie malnutrition -Currently NPO. - Continue TPN per pharmacy.   DVT prophylaxis: Heparin  GTT Code Status: Full Family Communication: Updated patient wife at bedside. Disposition: TBD  Status is: Inpatient Remains inpatient appropriate because: Severity of of illness   Consultants:  Gastroenterology: Dr. Kriss 11/19/2023 Vascular surgery: Dr.Robins 11/20/2023 General Surgery Interventional radiology  Procedures:  CT abdomen and pelvis 11/13/2023, 11/16/2023 Upper extremity arterial duplex, left 11/21/2023 Bilateral lower extremity Dopplers 11/26/2023 SBO protocol 11/17/2023 PICC line  Antimicrobials:  Anti-infectives (From admission, onward)    Start     Dose/Rate Route Frequency Ordered Stop   11/27/23 1200  erythromycin  500 mg in sodium chloride  0.9 % 100 mL IVPB        500 mg 100 mL/hr over 60 Minutes Intravenous Every 6 hours 11/27/23 0933 11/29/23 0723   11/24/23 1400  erythromycin  500 mg in sodium chloride  0.9 % 100 mL IVPB  Status:  Discontinued        500 mg 100 mL/hr over 60 Minutes Intravenous Every 8 hours 11/24/23 1027 11/27/23 0933    11/23/23 2000  erythromycin  250 mg in sodium chloride  0.9 % 100 mL IVPB  Status:  Discontinued        250 mg 100 mL/hr over 60 Minutes Intravenous Every 8 hours 11/23/23 1744 11/24/23 1027   11/23/23 1200  erythromycin  (E-MYCIN ) tablet 250 mg  Status:  Discontinued        250 mg Oral 3 times daily before meals 11/23/23 1004 11/23/23 1744   11/18/23 1200  erythromycin  (E-MYCIN ) tablet 250 mg  Status:  Discontinued        250 mg Oral 2 times daily with meals 11/18/23 0720 11/19/23 0734   11/13/23 1000  acyclovir  (ZOVIRAX ) tablet 400 mg  Status:  Discontinued        400 mg Oral 2 times daily 11/13/23 0503 11/19/23 0739         Subjective: Patient lying in bed.  Denies any chest pain or shortness of breath.  Denies any nausea or emesis.  No flatus.  No bowel movement.  NG tube in place.  States was seen by gastroenterology and to have procedure tomorrow.  Wife at bedside.  Objective: Vitals:   11/29/23 0700 11/29/23 0800 11/29/23 0900 11/29/23 1000  BP: 110/63 (!) 125/58 125/67 (!) 141/74  Pulse: 99 96 92 94  Resp: 16 (!) 31 19 (!) 22  Temp:  99.4 F (  37.4 C)    TempSrc:  Axillary    SpO2: 99% 99%    Weight:      Height:        Intake/Output Summary (Last 24 hours) at 11/29/2023 1020 Last data filed at 11/29/2023 0714 Gross per 24 hour  Intake 2907.07 ml  Output 2090 ml  Net 817.07 ml   Filed Weights   11/26/23 0446 11/27/23 0500 11/28/23 0500  Weight: 83.6 kg 85.1 kg 81.4 kg    Examination:  General exam: Appears calm and comfortable.  NG tube in nare with some bilious drainage noted. Respiratory system: Clear to auscultation.  No wheezes, no crackles, no rhonchi.  Fair air movement.  Respiratory effort normal. Cardiovascular system: S1 & S2 heard, RRR. No JVD, murmurs, rubs, gallops or clicks. No pedal edema. Gastrointestinal system: Abdomen is nondistended, soft and nontender. No organomegaly or masses felt. Normal bowel sounds heard. Central nervous system: Alert and  oriented. No focal neurological deficits. Extremities: Symmetric 5 x 5 power. Skin: No rashes, lesions or ulcers Psychiatry: Judgement and insight appear normal. Mood & affect appropriate.     Data Reviewed: I have personally reviewed following labs and imaging studies  CBC: Recent Labs  Lab 11/23/23 0407 11/24/23 0401 11/25/23 0523 11/26/23 0440 11/27/23 0510 11/29/23 0643  WBC 8.7 13.1* 7.0 5.9 5.9 6.9  NEUTROABS 7.3 11.3* 5.2 4.3 4.3  --   HGB 8.8* 9.7* 8.5* 8.5* 8.5* 8.1*  HCT 27.6* 30.6* 26.1* 26.1* 27.0* 27.4*  MCV 95.2 95.0 94.6 94.6 96.1 100.7*  PLT 183 195 186 204 249 279    Basic Metabolic Panel: Recent Labs  Lab 11/24/23 0401 11/25/23 0523 11/26/23 0440 11/27/23 0510 11/28/23 0504 11/29/23 0643  NA 132* 135 138 143 147* 149*  K 4.4 4.4 3.8 3.3* 3.5 4.2  CL 99 99 96* 101 104 114*  CO2 28 31 33* 36* 35* 27  GLUCOSE 114* 114* 127* 132* 129* 122*  BUN 16 20 23  26* 28* 33*  CREATININE 0.41* 0.53* 0.57* 0.53* 0.58* 0.56*  CALCIUM  7.8* 7.9* 8.1* 8.2* 8.4* 8.5*  MG 1.9 2.2 2.3 2.3  --  2.5*  PHOS 2.8 3.8 4.9* 4.6  --  3.5    GFR: Estimated Creatinine Clearance: 96.1 mL/min (A) (by C-G formula based on SCr of 0.56 mg/dL (L)).  Liver Function Tests: Recent Labs  Lab 11/23/23 0407 11/24/23 0401 11/25/23 0523 11/26/23 0440 11/27/23 0510  AST 19 32 73* 73* 50*  ALT 19 27 64* 114* 94*  ALKPHOS 56 68 70 80 94  BILITOT 0.2 0.4 0.4 0.4 0.4  PROT 4.4* 5.1* 4.8* 5.1* 5.4*  ALBUMIN  1.7* 1.8* 1.6* 1.8* 2.0*    CBG: Recent Labs  Lab 11/28/23 0007 11/28/23 0801 11/28/23 1613 11/28/23 2348 11/29/23 0725  GLUCAP 136* 128* 126* 125* 111*     No results found for this or any previous visit (from the past 240 hours).       Radiology Studies: No results found.      Scheduled Meds:  butamben -tetracaine -benzocaine   1 spray Topical Once   [START ON 11/30/2023] Chlorhexidine  Gluconate Cloth  6 each Topical Q2200   fluticasone   1 spray Each Nare  Daily   insulin  aspart  0-9 Units Subcutaneous Q8H   pantoprazole  (PROTONIX ) IV  40 mg Intravenous Q12H   sodium chloride  flush  10-40 mL Intracatheter Q12H   Continuous Infusions:  dextrose  100 mL/hr at 11/29/23 0945   heparin  2,000 Units/hr (11/29/23 0740)   TPN ADULT (  ION) 100 mL/hr at 11/29/23 0714     LOS: 16 days    Time spent: 40 minutes    Toribio Hummer, MD [facility]   To contact the attending provider between 7A-7P or the covering provider during after hours 7P-7A, please log into the web site [email or web address] and access using universal [place] password for that web site. If you do not have the password, please call the hospital operator.  11/29/2023, 10:20 AM

## 2023-11-29 NOTE — Progress Notes (Signed)
 David Mcgrath 12:35 PM  Subjective: Patient doing about the same and he was seen and examined and discussed with his wife again and we answered all of her questions and we rediscussed the procedure tomorrow and it looks like he has had less NG output today and it is lighter and has no new complaints  Objective: Vital signs stable afebrile no acute distress abdomen is soft nontender K4.2  Assessment: Chronic ileus  Plan: Will proceed with endoscopy possible enteroscopy and small bowel biopsy to rule out amyloid with anesthesia assistance tomorrow and please make sure to turn off heparin  at 6 AM as ordered  Casondra Gasca E  office [phone] After 5PM or if no answer call [phone]

## 2023-11-29 NOTE — Progress Notes (Signed)
 PHARMACY - ANTICOAGULATION CONSULT NOTE  Pharmacy Consult for heparin  Indication: atrial fibrillation  Allergies  Allergen Reactions   Shrimp [Shellfish Allergy] Anaphylaxis and Swelling    Swelling throat    Reglan  [Metoclopramide ] Other (See Comments)    caused tardive dyskinesia    Patient Measurements: Height: 6' 4 (193 cm) Weight: 81.4 kg (179 lb 7.3 oz) IBW/kg (Calculated) : 86.8 HEPARIN  DW (KG): 94.6  Vital Signs: Temp: 98.4 F (36.9 C) (08/27 0400) Temp Source: Oral (08/27 0400) BP: 127/70 (08/27 0400) Pulse Rate: 100 (08/27 0400)  Labs: Recent Labs    11/27/23 0510 11/28/23 0504 11/29/23 0643 11/29/23 0644  HGB 8.5*  --  8.1*  --   HCT 27.0*  --  27.4*  --   PLT 249  --  279  --   HEPARINUNFRC 0.54 0.62  --  0.78*  CREATININE 0.53* 0.58*  --   --     Estimated Creatinine Clearance: 96.1 mL/min (A) (by C-G formula based on SCr of 0.58 mg/dL (L)).   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  Patient reports he does not have symptoms as of 02/25/14.     Diarrhea 06/04/2023   Essential hypertension 06/04/2023   Fatty liver 06/04/2023   Flu 06/04/2023   Hyperlipidemia 06/04/2023   Ileus (HCC) 06/04/2023   Multiple myeloma (HCC)    Neuropathy    Paroxysmal atrial fibrillation (HCC) 07/04/2023   Pneumonia 06/04/2023     Assessment: 73 year old male with afib on Eliquis  PTA which is currently on hold. Patient recently admitted for bowel obstruction and discharged home after conservative treatment with NGT decompression. He presented back with similar concerns, currently being treated for ileus vs pSBO, plan to start TPN given prolonged hospitalization without nutrition. His Eliquis  was initially resumed on admission and  discontinued on 8/14. Has been on prophylactic Lovenox . Pharmacy consulted for heparin  initiation and management.  11/25/23 evening  - HL 0.78, increased to supra-therapeutic on heparin  2150 units/hr.   - CBC: Hgb low/stable at 8.1, Plt WNL - No bleeding or line issues per RN   Goal of Therapy:  Heparin  level 0.3-0.7 units/ml Monitor platelets by anticoagulation protocol: Yes   Plan:  - Decrease to heparin  infusion at 2000 units/hr - Heparin  level in 8 hours after rate change - Daily CBC, HL - Continue to follow for transition back to Eliquis  as appropriate   Wanda Hasting PharmD, BCPS [facility] main pharmacy [phone] 11/29/2023 7:24 AM

## 2023-11-29 NOTE — TOC Progression Note (Signed)
 Transition of Care [facility]) - Progression Note    Patient Details  Name: David Mcgrath MRN: [id number] Date of Birth: May 06, 1950  Transition of Care [facility]) CM/SW Contact  Jon ONEIDA Anon, RN Phone Number: 11/29/2023, 2:41 PM  Clinical Narrative:    Pt is recommended to go for STR at a SNF at [place]. Pt still has NG to suction. Has TPN infusing, and diet is NPO. GI following, with procedure scheduled for tomorrow. Will send out SNF referrals closer to medical readiness. TOC continuing to follow.     Expected Discharge Plan: Skilled Nursing Facility Barriers to Discharge: Continued Medical Work up               Expected Discharge Plan and Services In-house Referral: NA Discharge Planning Services: CM Consult Post Acute Care Choice: Skilled Nursing Facility Living arrangements for the past 2 months: Single Family Home                 DME Arranged: N/A DME Agency: NA       HH Arranged: NA HH Agency: NA         Social Drivers of Health (SDOH) Interventions SDOH Screenings   Food Insecurity: No Food Insecurity (11/13/2023)  Housing: Low Risk  (11/13/2023)  Transportation Needs: No Transportation Needs (11/13/2023)  Utilities: Not At Risk (11/13/2023)  Depression (PHQ2-9): Low Risk  (11/09/2023)  Social Connections: Socially Integrated (11/13/2023)  Tobacco Use: Low Risk  (11/12/2023)    Readmission Risk Interventions    11/05/2023   10:31 AM 10/15/2023   11:45 AM  Readmission Risk Prevention Plan  Transportation Screening Complete Complete  PCP or Specialist Appt within 3-5 Days Complete Complete  HRI or Home Care Consult Complete Complete  Social Work Consult for Recovery Care Planning/Counseling Complete Complete  Palliative Care Screening Not Applicable Not Applicable  Medication Review Oceanographer) Complete Complete

## 2023-11-29 NOTE — Progress Notes (Signed)
 Pharmacy Brief Note - Evening Anticoagulation Follow Up:  Pt is a 73 yoM currently on heparin  drip while apixaban  on hold. For full history, see note from Wanda Hasting, PharmD from earlier today.   Assessment: Today, 11/29/23 Heparin  level = 0.41 is therapeutic on heparin  infusion at 2000 units/hr RN reported that pt had a nosebleed this afternoon, which has improved. Reports there is still some oozing around NG. No other signs of bleeding. This was discussed with Dr. Sebastian > plan to continue heparin  at this time.   Plan: Continue heparin  infusion at current rate of 2000 units/hr CBC, heparin  level daily Monitor for any additional signs of bleeding Heparin  scheduled to stop on 8/28 @ 06:00 per GI for endoscopy.   Ronal CHRISTELLA Rav, PharmD 11/29/23 4:19 PM

## 2023-11-30 ENCOUNTER — Inpatient Hospital Stay

## 2023-11-30 ENCOUNTER — Inpatient Hospital Stay (HOSPITAL_COMMUNITY): Admitting: Certified Registered Nurse Anesthetist

## 2023-11-30 ENCOUNTER — Inpatient Hospital Stay: Admitting: Dietician

## 2023-11-30 ENCOUNTER — Other Ambulatory Visit

## 2023-11-30 ENCOUNTER — Ambulatory Visit: Admitting: Hematology and Oncology

## 2023-11-30 ENCOUNTER — Encounter (HOSPITAL_COMMUNITY): Payer: Self-pay | Admitting: Internal Medicine

## 2023-11-30 ENCOUNTER — Encounter (HOSPITAL_COMMUNITY): Admission: EM | Disposition: E | Payer: Self-pay | Source: Home / Self Care | Attending: Internal Medicine

## 2023-11-30 DIAGNOSIS — K760 Fatty (change of) liver, not elsewhere classified: Secondary | ICD-10-CM

## 2023-11-30 DIAGNOSIS — C9000 Multiple myeloma not having achieved remission: Secondary | ICD-10-CM | POA: Diagnosis not present

## 2023-11-30 DIAGNOSIS — I10 Essential (primary) hypertension: Secondary | ICD-10-CM | POA: Diagnosis not present

## 2023-11-30 DIAGNOSIS — E876 Hypokalemia: Secondary | ICD-10-CM | POA: Diagnosis not present

## 2023-11-30 DIAGNOSIS — E785 Hyperlipidemia, unspecified: Secondary | ICD-10-CM

## 2023-11-30 DIAGNOSIS — K567 Ileus, unspecified: Secondary | ICD-10-CM | POA: Diagnosis not present

## 2023-11-30 DIAGNOSIS — F32A Depression, unspecified: Secondary | ICD-10-CM

## 2023-11-30 HISTORY — PX: ENTEROSCOPY: SHX5533

## 2023-11-30 LAB — CBC
HCT: 27.6 % — ABNORMAL LOW (ref 39.0–52.0)
Hemoglobin: 8.3 g/dL — ABNORMAL LOW (ref 13.0–17.0)
MCH: 30.5 pg (ref 26.0–34.0)
MCHC: 30.1 g/dL (ref 30.0–36.0)
MCV: 101.5 fL — ABNORMAL HIGH (ref 80.0–100.0)
Platelets: 287 K/uL (ref 150–400)
RBC: 2.72 MIL/uL — ABNORMAL LOW (ref 4.22–5.81)
RDW: 19.5 % — ABNORMAL HIGH (ref 11.5–15.5)
WBC: 6.9 K/uL (ref 4.0–10.5)
nRBC: 0 % (ref 0.0–0.2)

## 2023-11-30 LAB — COMPREHENSIVE METABOLIC PANEL WITH GFR
ALT: 76 U/L — ABNORMAL HIGH (ref 0–44)
AST: 36 U/L (ref 15–41)
Albumin: 2.5 g/dL — ABNORMAL LOW (ref 3.5–5.0)
Alkaline Phosphatase: 142 U/L — ABNORMAL HIGH (ref 38–126)
Anion gap: 10 (ref 5–15)
BUN: 33 mg/dL — ABNORMAL HIGH (ref 8–23)
CO2: 23 mmol/L (ref 22–32)
Calcium: 8.1 mg/dL — ABNORMAL LOW (ref 8.9–10.3)
Chloride: 112 mmol/L — ABNORMAL HIGH (ref 98–111)
Creatinine, Ser: 0.55 mg/dL — ABNORMAL LOW (ref 0.61–1.24)
GFR, Estimated: >60 mL/min (ref >60)
Glucose, Bld: 120 mg/dL — ABNORMAL HIGH (ref 70–99)
Potassium: 4.1 mmol/L (ref 3.5–5.1)
Sodium: 144 mmol/L (ref 135–145)
Total Bilirubin: 0.5 mg/dL (ref 0.0–1.2)
Total Protein: 5.5 g/dL — ABNORMAL LOW (ref 6.5–8.1)

## 2023-11-30 LAB — GLUCOSE, CAPILLARY
Glucose-Capillary: 110 mg/dL — ABNORMAL HIGH (ref 70–99)
Glucose-Capillary: 112 mg/dL — ABNORMAL HIGH (ref 70–99)
Glucose-Capillary: 114 mg/dL — ABNORMAL HIGH (ref 70–99)

## 2023-11-30 LAB — HEPARIN LEVEL (UNFRACTIONATED): Heparin Unfractionated: 0.62 [IU]/mL (ref 0.30–0.70)

## 2023-11-30 LAB — MAGNESIUM: Magnesium: 2.3 mg/dL (ref 1.7–2.4)

## 2023-11-30 LAB — PHOSPHORUS: Phosphorus: 3.4 mg/dL (ref 2.5–4.6)

## 2023-11-30 SURGERY — ENTEROSCOPY
Anesthesia: Monitor Anesthesia Care

## 2023-11-30 MED ORDER — ALBUMIN HUMAN 5 % IV SOLN: INTRAVENOUS | Status: DC | PRN

## 2023-11-30 MED ORDER — LIDOCAINE 2% (20 MG/ML) 5 ML SYRINGE
INTRAMUSCULAR | Status: DC | PRN
  Administered 2023-11-30: 40 mg via INTRAVENOUS

## 2023-11-30 MED ORDER — PROPOFOL 10 MG/ML IV BOLUS
INTRAVENOUS | Status: DC | PRN
  Administered 2023-11-30 (×2): 20 mg via INTRAVENOUS

## 2023-11-30 MED ORDER — HEPARIN (PORCINE) 25000 UT/250ML-% IV SOLN
2200.0000 [IU]/h | INTRAVENOUS | Status: DC
  Administered 2023-11-30 – 2023-12-02 (×5): 1900 [IU]/h via INTRAVENOUS
  Administered 2023-12-03: 2050 [IU]/h via INTRAVENOUS
  Administered 2023-12-03 – 2023-12-07 (×7): 2150 [IU]/h via INTRAVENOUS
  Administered 2023-12-07: 2200 [IU]/h via INTRAVENOUS
  Filled 2023-11-30 (×13): qty 250

## 2023-11-30 MED ORDER — SODIUM CHLORIDE 0.9 % IV SOLN: INTRAVENOUS | Status: DC | PRN

## 2023-11-30 MED ORDER — PHENYLEPHRINE 80 MCG/ML (10ML) SYRINGE FOR IV PUSH (FOR BLOOD PRESSURE SUPPORT)
PREFILLED_SYRINGE | INTRAVENOUS | Status: DC | PRN
  Administered 2023-11-30: 160 ug via INTRAVENOUS
  Administered 2023-11-30: 80 ug via INTRAVENOUS
  Administered 2023-11-30 (×2): 160 ug via INTRAVENOUS

## 2023-11-30 MED ORDER — EPHEDRINE SULFATE (PRESSORS) 50 MG/ML IJ SOLN
INTRAMUSCULAR | Status: DC | PRN
  Administered 2023-11-30 (×2): 5 mg via INTRAVENOUS

## 2023-11-30 MED ORDER — PROPOFOL 500 MG/50ML IV EMUL
INTRAVENOUS | Status: DC | PRN
  Administered 2023-11-30: 180 ug/kg/min via INTRAVENOUS

## 2023-11-30 MED ORDER — TRAVASOL 10 % IV SOLN
INTRAVENOUS | Status: AC
  Filled 2023-11-30: qty 1320

## 2023-11-30 NOTE — Transfer of Care (Signed)
 Immediate Anesthesia Transfer of Care Note  Patient: David Mcgrath  Procedure(s) Performed: ENTEROSCOPY  Patient Location: Endoscopy Unit  Anesthesia Type:MAC  Level of Consciousness: sedated  Airway & Oxygen Therapy: Patient Spontanous Breathing  Post-op Assessment: Report given to RN and  BP low, treated with 250 albumin  and neo bolus  Post vital signs: Reviewed  Last Vitals:  Vitals Value Taken Time  BP 132/71 11/30/23 12:37  Temp 36.4 C 11/30/23 12:29  Pulse 90 11/30/23 12:37  Resp 30 11/30/23 12:37  SpO2 100 % 11/30/23 12:37  Vitals shown include unfiled device data.  Last Pain:  Vitals:   11/30/23 1232  TempSrc:   PainSc: Asleep      Patients Stated Pain Goal: 0 (11/28/23 1600)  Complications: No notable events documented.

## 2023-11-30 NOTE — Progress Notes (Signed)
 PHARMACY - TOTAL PARENTERAL NUTRITION CONSULT NOTE   Indication: Bowel Obstruction  Patient Measurements: Height: 6' 4 (193 cm) Weight: 81.4 kg (179 lb 7.3 oz) IBW/kg (Calculated) : 86.8 TPN AdjBW (KG): 94.6 Body mass index is 21.84 kg/m. Usual Weight:   Assessment:  Pharmacy is consulted to start TPN on 73 yo male diagnosed with bowel obstruction. This admission CT abdomen shows increasing small bowel dilatation and fecalization of bowel contents involving the jejunum. No definitive transition point is identified at this time but caliber change is noted in the right mid abdomen at the junction of the jejunum and ileum. The more distal ileum is unremarkable. Some suspicion that bowel obstruction may be related to bortezomib  treatment pt receiving for multiple myeloma.   Glucose / Insulin : no hx of DM. Goal BG <150 - CBG range: 109 - 123 (1 unit SSI/24 hrs) Electrolytes: All WNL, including CorrCa (9.3).  -Cl slightly elevated Renal: SCr < 1, BUN stable Hepatic: AST, Tbili WNL. ALT trending down. Alk Phos elevated, albumin  low Intake / Output; MIVF: Output decreased significantly -UOP: 400 mL, NG output: 750 mL, stool x2 -mIVF: D5 @ 75 mL/hr expiring this morning GI Imaging: -  Admitted from 10/14/23-10/20/23 for symptoms related to small bowel obstruction (transition point suspected in the right hemi-abdomen on CT imaging) and received non-operative management.  - Admitted 11/02/23 - 11/05/2023 for small bowel dilatation/concern for small bowel obstruction. CT imaging showed persistent small bowel dilatation with mild transition point noted deep in pelvis. He went to OR 11/03/23, findings were suspicious for intermittent volvulus of small bowel to explain his symptoms and CT imaging.  - 8/10  CT abdomen shows increasing small bowel dilatation and fecalization of bowel contents involving the jejunum. No definitive transition point is identified at this time but caliber change is noted in the  right mid abdomen at the junction of the jejunum and ileum.  GI Surgeries / Procedures:  -8/28: Planning EGD, enteroscopy  Central access:  PICC 8/18   Nutritional Goals: Goal TPN rate is 100  mL/hr (provides 132 g of protein and 2438 kcals per day)  RD Assessment:  Estimated Needs Total Energy Estimated Needs: 2400-2600 kcals Total Protein Estimated Needs: 120-135 grams Total Fluid Estimated Needs: >/= 2.4L  Current Nutrition:  NPO and TPN  Plan:  At 18:00 Continue TPN at 100 mL/hr Electrolytes in TPN: Decrease K slightly Na 0 mEq/L K 40 mEq/L Ca 2.5 mEq/L Mg 5 mEq/L Phos 15 mmol/L  Cl:Ac max acetate Add standard MVI and trace elements to TPN Continue q8h Sensitive SSI and adjust as needed.  Reduce D5 to KVO. Further management per MD. Monitor TPN labs on Mon/Thurs. Recheck electrolytes with AM labs tomorrow.   Ronal CHRISTELLA Rav, PharmD 11/30/23 8:09 AM

## 2023-11-30 NOTE — Plan of Care (Signed)

## 2023-11-30 NOTE — Progress Notes (Signed)
 PROGRESS NOTE    David Mcgrath  FMW:[id number] DOB: [birth year] DOA: 11/12/2023 PCP: [name], DO    Chief Complaint  Patient presents with   Weakness   Dizziness   Diarrhea         Brief Narrative:  73 y.o. male with medical history significant for hypertension, hyperlipidemia, paroxysmal A-fib on Eliquis , chronic anxiety/depression, polyneuropathy, multiple myeloma (diagnosed March 2025) received Bortezomib  infusion 11/09/2023. Patient was admitted here 11/02/2023 through 11/05/2023 with intractable nausea vomiting, recurrent small bowel obstruction.  He was treated conservatively with NG tube decompression and discharged home.     Last admission CT abdomen/pelvis with contrast with slight improvement in small bowel dilation when compared to prior exam but with mild transition point deep in the pelvis, no pancreatic ductal dilation, no inflammatory changes, stable free fluid within the abdomen.  General surgery was consulted and patient underwent diagnostic laparoscopy by Dr. [name] on 11/03/2023 findings of intermittently dilated small bowel with erythema of the serosa consistent with inflammation/edema, intermittent injection of the small bowel mesentery, small bowel with tendency to migrate to the left upper quadrant with long/mobile small bowel mesentery with suspicion/potential for intermittent volvulized small bowel contributing to his symptoms and findings on imaging.  Diet was slowly advanced with toleration.     This admission CT abdomen shows increasing small bowel dilatation and fecalization of bowel contents involving the jejunum. No definitive transition point is identified at this time but caliber change is noted in the right mid abdomen at the junction of the jejunum and ileum. The more distal ileum is unremarkable. Stable ground-glass opacities in the lung bases similar to that seen on prior exams.    **Interim History Has had a persistent ileus and continues  to  have gastric distention. Unfortunately lost his NG tube on 8/21 but had to be replaced by IR.  Now connected back to LIWS.  Underwent a upper GI series with small bowel follow-through and this shows contrast in the stomach with delayed gastric emptying.  Subsequently there was contrast in the rectum suggesting no complete obstruction.  GI recommending continuing erythromycin  for now and repeating abdominal x-rays and considering EGD early next week (Monday vs Tuesday) to rule out pyloric stricture and recommending ambulation.   8/25: Patient was admitted on 8/10 and I assumed care today on 8/25.  Please review prior notes.  Most of the stuff carried on for continued of care.  Further management as below.   Assessment & Plan:   Principal Problem:   Ileus (HCC) Active Problems:   Hypokalemia   Paroxysmal atrial fibrillation (HCC)   Multiple myeloma (HCC)   Essential hypertension   Depression   Hyperlipidemia   Pressure injury of skin   Protein-calorie malnutrition, severe   Hypernatremia   Swelling of lower extremity   Transaminitis   Acute urinary retention  #1 ileus versus partial small bowel obstruction -Patient noted to have presented with nausea vomiting diarrhea. - CT abdomen and pelvis done concerning ileus versus partial SBO. - Patient noted to have been treated conservatively. - NG tube not initially placed. - Patient initially was improving around the hospitalization and had further recurrence of nausea and vomiting. - Repeat CT abdomen and pelvis done with persistent small bowel dilatation with prominent jejunum. - Patient started a trial with erythromycin  on 11/18/2023 but was not successful as patient noted to began having large biliary emesis on 11/18/2023. - Due to allergy to Reglan  patient was placed on IV erythromycin  and dose  increased to 500 mg 3 times daily. - NG tube initially placed 11/18/2023 and subsequently had to be replaced 11/23/2023. - Patient seen in  consultation by general surgery and did not feel patient needed any repeat laparoscopy as deferred would not be beneficial. - Due to patient's prolonged hospitalization minimal nutrition patient started on TPN after PICC line was placed on 11/20/2023. - Patient subsequently underwent UGI with SBFT per surgery to assess for transit and patency which showed contrast present in the stomach with delayed gastric emptying. - Narcotics are to be minimized. - Patient seen in consultation by GI review might take several days to weeks for issue to resolve. - KUB done 11/24/2023 showed minimal bowel contrast material demonstrated.  There appears to be contrast material in the rectum suggesting no evidence of complete obstruction.  Gaseous distention of small bowel.  Gas-filled colon most likely ileus. - Repeat abdominal films done with similar diffuse gaseous distention of the bowel and abdomen and pelvis compared to prior films. - GI reconsulted and following the patient for endoscopy, possible enteroscopy and small bowel biopsy to rule out amyloid with anesthesia assistance to be done today, 11/30/2023.. - GI recommended to continue erythromycin  for now, mobilization to stimulate gastric intestinal motility with narcotic avoidance. - Continue PPI twice daily.  2.  Hypotension - Resolved with hydration.  3.  Lactic acidosis -Resolved.  4.  Leukocytosis -Likely reactive, patient noted to have large GI losses on 8/16 and subsequently became hypotensive with elevated lactic acid levels. - Patient placed on IV fluids with a bolus and maintenance fluids with normalization of lactic acid on 11/19/2023. - Low suspicion for infection. - Status post IV fluids. - Continue to hold antibiotics at this time as no further leukocytosis, patient afebrile - Patient noted to have some orthostatic hypotension on 11/28/2023 while working with PT/OT and advised Ace wrap/TED hose while working with PT.  5.  Diarrhea -Patient  noted to have reported semiformed stools. - GI pathogen panel negative. - Patient likely colonized with C. difficile as testing was negative for any active infection (antigen positive, toxin negative, PCR negative). - Follow.  6.  Acute urinary retention -Foley catheter placed 11/13/2023. - When more stable and ambulating will undergo a voiding trial.  7.  History of multiple myeloma -Per Dr. Von he was found somewhat patient symptoms thought to be due to chemotherapeutic medications and oncology reengaged to see if they had any further recommendations.   -Outpatient follow-up with oncology.  8.  Paroxysmal A-fib -Not on any rate limiting medications. - Patient noted to be on metoprolol  at home but on a as needed basis. - Was on Eliquis  which currently on hold as patient for probable procedure today 11/30/2023. - Heparin  drip on hold pending procedure today.   - Will defer to GI when heparin  can be resumed post procedure.  9.  Left finger discoloration -Resolved. - Noted to have had some discoloration of the left fingers on 8/18 however wife stated that was present on 8/17 and actually had improved on 8/18 and subsequently 8/20. - Concern was for cyanosis of his fingertips as they were also cold. - Patient noted to to have PICC line placed in the left upper extremity 8/18 however wife stated discoloration was present prior to that; there was also some extravasation from prior IV in the left arm as well but unclear what was transfusing as it happened prior to transfer to the ICU. - Left upper extremity arterial Dopplers were done with  no obstruction of the left upper extremity with triphasic waves. - Patient seen in consultation by vascular surgery who felt no surgical intervention needed at this time. - Resolution of finger discoloration 8/19.  10.  Normocytic anemia -Patient with no significant overt bleeding. - Hemoglobin stable at 8.3. - Follow H&H. - Transfusion threshold  hemoglobin < 8.  11.  Hypernatremia -Likely secondary to dehydration. - Patient currently on TPN. - Improved with D5W.   - Saline lock IV fluids.  12.  Hypokalemia/hypomagnesemia/hyponatremia -Hyponatremia resolved patient now currently hyponatremic. - Patient on TPN. - Potassium now at 4.1.  Phosphorus at 3.4, magnesium  at 2.3. - Repeat labs in AM.  13.  Abnormal LFTs -Acute hepatitis panel negative. - LFTs trending back down. - Labs in the AM.  14.  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 - Status post IV Lasix  x 1. - Saline lock IV fluids  15.  Overweight -BMI of 22.43 kg/m. - Lifestyle modification - Outpatient follow-up with PCP.  16.  Severe protein calorie malnutrition -Currently NPO. - Continue TPN per pharmacy.   DVT prophylaxis: Heparin  GTT Code Status: Full Family Communication: Updated patient and wife at bedside. Disposition: TBD  Status is: Inpatient Remains inpatient appropriate because: Severity of of illness   Consultants:  Gastroenterology: Dr. Kriss 11/19/2023 Vascular surgery: Dr.Robins 11/20/2023 General Surgery Interventional radiology  Procedures:  CT abdomen and pelvis 11/13/2023, 11/16/2023 Upper extremity arterial duplex, left 11/21/2023 Bilateral lower extremity Dopplers 11/26/2023 SBO protocol 11/17/2023 PICC line  Antimicrobials:  Anti-infectives (From admission, onward)    Start     Dose/Rate Route Frequency Ordered Stop   11/27/23 1200  erythromycin  500 mg in sodium chloride  0.9 % 100 mL IVPB        500 mg 100 mL/hr over 60 Minutes Intravenous Every 6 hours 11/27/23 0933 11/29/23 0723   11/24/23 1400  erythromycin  500 mg in sodium chloride  0.9 % 100 mL IVPB  Status:   Discontinued        500 mg 100 mL/hr over 60 Minutes Intravenous Every 8 hours 11/24/23 1027 11/27/23 0933   11/23/23 2000  erythromycin  250 mg in sodium chloride  0.9 % 100 mL IVPB  Status:  Discontinued        250 mg 100 mL/hr over 60 Minutes Intravenous Every 8 hours 11/23/23 1744 11/24/23 1027   11/23/23 1200  erythromycin  (E-MYCIN ) tablet 250 mg  Status:  Discontinued        250 mg Oral 3 times daily before meals 11/23/23 1004 11/23/23 1744   11/18/23 1200  erythromycin  (E-MYCIN ) tablet 250 mg  Status:  Discontinued        250 mg Oral 2 times daily with meals 11/18/23 0720 11/19/23 0734   11/13/23 1000  acyclovir  (ZOVIRAX ) tablet 400 mg  Status:  Discontinued        400 mg Oral 2 times daily 11/13/23 0503 11/19/23 0739         Subjective: Patient sleeping but easily arousable.  Noted to have some nasal bleeding yesterday afternoon which has improved and resolved with Afrin nasal spray.  Patient denies any chest pain, no shortness of breath, no abdominal pain.  Passing flatus.  No bowel movement.  Awaiting procedure today.  Wife at bedside.   Objective: Vitals:   11/29/23  2300 11/30/23 0000 11/30/23 0400 11/30/23 0800  BP: 139/89 137/70  (!) 162/77  Pulse: 90 86  80  Resp: (!) 28 (!) 25  (!) 27  Temp:  (!) 97.5 F (36.4 C) (!) 97.4 F (36.3 C)   TempSrc:  Oral Oral   SpO2: 98% 98%  100%  Weight:      Height:        Intake/Output Summary (Last 24 hours) at 11/30/2023 0935 Last data filed at 11/30/2023 0810 Gross per 24 hour  Intake 4358.69 ml  Output 2000 ml  Net 2358.69 ml   Filed Weights   11/26/23 0446 11/27/23 0500 11/28/23 0500  Weight: 83.6 kg 85.1 kg 81.4 kg    Examination:  General exam: NAD.  NG tube in the with some bilious drainage.  Dried blood noted on the ears.  Respiratory system: CTAB.  No wheezes, no crackles, no rhonchi.  Fair air movement.  Speaking in full sentences.  Normal respiratory effort.  Cardiovascular system: Regular rate rhythm no  murmurs rubs or gallops.  No JVD.  Trace bilateral lower extremity edema.  Gastrointestinal system: Abdomen is nondistended, soft and nontender. No organomegaly or masses felt. Normal bowel sounds heard. Central nervous system: Alert and oriented. No focal neurological deficits. Extremities: Symmetric 5 x 5 power. Skin: No rashes, lesions or ulcers Psychiatry: Judgement and insight appear normal. Mood & affect appropriate.     Data Reviewed: I have personally reviewed following labs and imaging studies  CBC: Recent Labs  Lab 11/24/23 0401 11/25/23 0523 11/26/23 0440 11/27/23 0510 11/29/23 0643 11/30/23 0500  WBC 13.1* 7.0 5.9 5.9 6.9 6.9  NEUTROABS 11.3* 5.2 4.3 4.3  --   --   HGB 9.7* 8.5* 8.5* 8.5* 8.1* 8.3*  HCT 30.6* 26.1* 26.1* 27.0* 27.4* 27.6*  MCV 95.0 94.6 94.6 96.1 100.7* 101.5*  PLT 195 186 204 249 279 287    Basic Metabolic Panel: Recent Labs  Lab 11/25/23 0523 11/26/23 0440 11/27/23 0510 11/28/23 0504 11/29/23 0643 11/30/23 0500  NA 135 138 143 147* 149* 144  K 4.4 3.8 3.3* 3.5 4.2 4.1  CL 99 96* 101 104 114* 112*  CO2 31 33* 36* 35* 27 23  GLUCOSE 114* 127* 132* 129* 122* 120*  BUN 20 23 26* 28* 33* 33*  CREATININE 0.53* 0.57* 0.53* 0.58* 0.56* 0.55*  CALCIUM  7.9* 8.1* 8.2* 8.4* 8.5* 8.1*  MG 2.2 2.3 2.3  --  2.5* 2.3  PHOS 3.8 4.9* 4.6  --  3.5 3.4    GFR: Estimated Creatinine Clearance: 96.1 mL/min (A) (by C-G formula based on SCr of 0.55 mg/dL (L)).  Liver Function Tests: Recent Labs  Lab 11/24/23 0401 11/25/23 0523 11/26/23 0440 11/27/23 0510 11/30/23 0500  AST 32 73* 73* 50* 36  ALT 27 64* 114* 94* 76*  ALKPHOS 68 70 80 94 142*  BILITOT 0.4 0.4 0.4 0.4 0.5  PROT 5.1* 4.8* 5.1* 5.4* 5.5*  ALBUMIN  1.8* 1.6* 1.8* 2.0* 2.5*    CBG: Recent Labs  Lab 11/29/23 0725 11/29/23 1212 11/29/23 1555 11/29/23 2344 11/30/23 0813  GLUCAP 111* 119* 123* 109* 110*     No results found for this or any previous visit (from the past 240  hours).       Radiology Studies: No results found.      Scheduled Meds:  butamben -tetracaine -benzocaine   1 spray Topical Once   Chlorhexidine  Gluconate Cloth  6 each Topical Q2200   fluticasone   1 spray Each Nare Daily  insulin  aspart  0-9 Units Subcutaneous Q8H   oxymetazoline   1 spray Each Nare BID   pantoprazole  (PROTONIX ) IV  40 mg Intravenous Q12H   sodium chloride  flush  10-40 mL Intracatheter Q12H   Continuous Infusions:  dextrose  10 mL/hr at 11/30/23 0825   TPN ADULT (ION) 100 mL/hr at 11/30/23 0725   TPN ADULT (ION)       LOS: 17 days    Time spent: 40 minutes    Toribio Hummer, MD [facility]   To contact the attending provider between 7A-7P or the covering provider during after hours 7P-7A, please log into the web site [email or web address] and access using universal [place] password for that web site. If you do not have the password, please call the hospital operator.  11/30/2023, 9:35 AM

## 2023-11-30 NOTE — Progress Notes (Signed)
 David Mcgrath 11:43 AM  Subjective: Patient doing fine without any new complaints or problems and possibly NG output has decreased based on computer  Objective: Vital signs stable afebrile no acute distress exam please see preassessment evaluation labs all stable  Assessment: Ileus want to rule out amyloid with his multiple myeloma  Plan: Okay to proceed with endoscopy with biopsy with anesthesia assistance  [facility]  office [phone] After 5PM or if no answer call [phone]

## 2023-11-30 NOTE — Progress Notes (Signed)
 PHARMACY - ANTICOAGULATION CONSULT NOTE  Pharmacy Consult for heparin  Indication: atrial fibrillation  Allergies  Allergen Reactions   Shrimp [Shellfish Allergy] Anaphylaxis and Swelling    Swelling throat    Reglan  [Metoclopramide ] Other (See Comments)    caused tardive dyskinesia    Patient Measurements: Height: 6' 4 (193 cm) Weight: 81.4 kg (179 lb 7.3 oz) IBW/kg (Calculated) : 86.8 HEPARIN  DW (KG): 94.6  Vital Signs: Temp: 97.6 F (36.4 C) (08/28 1229) Temp Source: Temporal (08/28 1229) BP: 119/58 (08/28 1300) Pulse Rate: 82 (08/28 1300)  Labs: Recent Labs    11/28/23 0504 11/29/23 0643 11/29/23 0644 11/29/23 1536 11/30/23 0000 11/30/23 0500  HGB  --  8.1*  --   --   --  8.3*  HCT  --  27.4*  --   --   --  27.6*  PLT  --  279  --   --   --  287  HEPARINUNFRC 0.62  --  0.78* 0.41 0.62  --   CREATININE 0.58* 0.56*  --   --   --  0.55*    Estimated Creatinine Clearance: 96.1 mL/min (A) (by C-G formula based on SCr of 0.55 mg/dL (L)).   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  Patient reports he does not have symptoms as of 02/25/14.     Diarrhea 06/04/2023   Essential hypertension 06/04/2023   Fatty liver 06/04/2023   Flu 06/04/2023   Hyperlipidemia 06/04/2023   Ileus (HCC) 06/04/2023   Multiple myeloma (HCC)    Neuropathy    Paroxysmal atrial fibrillation (HCC) 07/04/2023   Pneumonia 06/04/2023     Assessment: 73 year old male with afib on Eliquis  PTA which is currently on hold. Patient recently admitted for bowel obstruction and discharged home after conservative treatment with NGT decompression. He presented back with similar concerns, is currently on TPN.   Patient is currently on heparin  drip while apixaban  remains  on hold.   Significant Events: -8/27: Several nosebleeds overnight. Op note today mentions mild NGT trauma. NG removed.  -8/28: UFH off at 06:00 for enteroscopy  Today, 11/30/23 Per discussion with GI, ok to resume heparin  drip 4 hours post-procedure CBC: Hgb low but stable, Plt WNL  Goal of Therapy:  Heparin  level 0.3-0.7 units/ml Monitor platelets by anticoagulation protocol: Yes   Plan:  No bolus post-procedure Resume heparin  at previous rate of 1900 units/hr at 17:00 this evening (4 hours post-procedure) Check 8 hour heparin  level CBC, heparin  level daily Monitor for signs of bleeding  Ronal CHRISTELLA Rav, PharmD 11/30/23 1:21 PM

## 2023-11-30 NOTE — Progress Notes (Signed)
 PHARMACY - ANTICOAGULATION CONSULT NOTE  Pharmacy Consult for heparin  Indication: atrial fibrillation  Allergies  Allergen Reactions   Shrimp [Shellfish Allergy] Anaphylaxis and Swelling    Swelling throat    Reglan  [Metoclopramide ] Other (See Comments)    caused tardive dyskinesia    Patient Measurements: Height: 6' 4 (193 cm) Weight: 81.4 kg (179 lb 7.3 oz) IBW/kg (Calculated) : 86.8 HEPARIN  DW (KG): 94.6  Vital Signs: Temp: 97.5 F (36.4 C) (08/28 0000) Temp Source: Oral (08/28 0000) BP: 137/70 (08/28 0000) Pulse Rate: 86 (08/28 0000)  Labs: Recent Labs    11/27/23 0510 11/28/23 0504 11/29/23 0643 11/29/23 0644 11/29/23 1536 11/30/23 0000  HGB 8.5*  --  8.1*  --   --   --   HCT 27.0*  --  27.4*  --   --   --   PLT 249  --  279  --   --   --   HEPARINUNFRC 0.54 0.62  --  0.78* 0.41 0.62  CREATININE 0.53* 0.58* 0.56*  --   --   --     Estimated Creatinine Clearance: 96.1 mL/min (A) (by C-G formula based on SCr of 0.56 mg/dL (L)).   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  Patient reports he does not have symptoms as of 02/25/14.     Diarrhea 06/04/2023   Essential hypertension 06/04/2023   Fatty liver 06/04/2023   Flu 06/04/2023   Hyperlipidemia 06/04/2023   Ileus (HCC) 06/04/2023   Multiple myeloma (HCC)    Neuropathy    Paroxysmal atrial fibrillation (HCC) 07/04/2023   Pneumonia 06/04/2023     Assessment: 73 year old male with afib on Eliquis  PTA which is currently on hold. Patient recently admitted for bowel obstruction and discharged home after conservative treatment with NGT decompression. He presented back with similar concerns, currently being treated for ileus vs pSBO, plan to start TPN given prolonged  hospitalization without nutrition. His Eliquis  was initially resumed on admission and discontinued on 8/14. Has been on prophylactic Lovenox . Pharmacy consulted for heparin  initiation and management.  11/25/23 evening  - HL 0.62, therapeutic but increasing on heparin  2000 units/hr.   - CBC: Hgb low/stable at 8.1, Plt WNL - No line issues per RN, pt has had several nosebleeds overnight, but currently resolved.   Goal of Therapy:  Heparin  level 0.3-0.7 units/ml Monitor platelets by anticoagulation protocol: Yes   Plan:  - Decrease heparin  infusion to 1900 units/hr - Hold heparin  at 0600 for endoscopy - F/U anticoagulation plans post-procedure  Rosaline Millet, PharmD, BCPS 11/30/2023 2:12 AM

## 2023-11-30 NOTE — Progress Notes (Signed)
 OT Cancellation Note  Patient Details Name: David Mcgrath MRN: [id number] DOB: [birth year]   Cancelled Treatment:    Reason Eval/Treat Not Completed: Patient at procedure or test/ unavailable  Patient in endoscopy procedure this am. OT will continue to follow as schedule allows.   Maame Dack OT/L Acute Rehabilitation Department  [phone]    11/30/2023, 11:46 AM

## 2023-11-30 NOTE — Op Note (Signed)
 [facility] Patient Name: David Mcgrath Procedure Date: 11/30/2023 MRN: [id number] Attending MD: Oliva Boots , MD, [id number] Date of Birth: 1950-05-03 CSN: [id number] Age: 73 Admit Type: Inpatient Procedure:                Small bowel enteroscopy Indications:              Abnormal abdominal x-ray, Abnormal abdominal CT,                            Want to rule out amyloid in multiple myeloma Providers:                Oliva Boots, MD, Robie Breed, RN, [facility]                            [place], Technician, Leighton Agent, CRNA Referring MD:              Medicines:                Monitored Anesthesia Care Complications:            No immediate complications. Estimated Blood Loss:     Estimated blood loss: none. Procedure:                Pre-Anesthesia Assessment:                           - Prior to the procedure, a History and Physical                            was performed, and patient medications and                            allergies were reviewed. The patient's tolerance of                            previous anesthesia was also reviewed. The risks                            and benefits of the procedure and the sedation                            options and risks were discussed with the patient.                            All questions were answered, and informed consent                            was obtained. Prior Anticoagulants: The patient has                            taken heparin , last dose was day of procedure. ASA                            Grade Assessment: III - A patient with severe  systemic disease. After reviewing the risks and                            benefits, the patient was deemed in satisfactory                            condition to undergo the procedure.                           After obtaining informed consent, the endoscope was                            passed under direct vision. Throughout the                             procedure, the patient's blood pressure, pulse, and                            oxygen saturations were monitored continuously. The                            PCF-H190TL ([id number] Olympus Colonoscope was                            introduced through the mouth and advanced to the                            jejunum, to the 120 cm mark (from the incisors).                            The small bowel enteroscopy was accomplished                            without difficulty. The patient tolerated the                            procedure well. Scope In: Scope Out: Findings:      A small hiatal hernia was present. There was mild NG tube trauma in the       esophagus      Localized mild inflammation characterized by erosions from NG tube       trauma was found in the gastric body. Biopsies were taken with a cold       forceps for histology.      There was no evidence of significant pathology in the entire examined       duodenum. Biopsies were taken with a cold forceps for histology.      There was no evidence of significant pathology at 60 cm (distal to the       pylorus). Biopsies were taken with a cold forceps for histology. Impression:               - Small hiatal hernia.                           - Caustic gastritis. Biopsied.                           -  Normal examined duodenum. Biopsied.                           - The examined portion of the jejunum was normal.                            Biopsied. Recommendation:           - NPO today. Hold NG for now and see how he does                            and may replace as needed increased abdominal pain                            and bloating nausea vomiting etc.                           - Continue present medications.                           - Resume heparin  at prior dose In 4 hours if doing                            well.                           - Return to GI clinic PRN.                           - Telephone  GI clinic for pathology results in 1                            week.                           - Telephone GI clinic if symptomatic PRN. Procedure Code(s):        --- Professional ---                           [phone], Small intestinal endoscopy, enteroscopy                            beyond second portion of duodenum, not including                            ileum; with biopsy, single or multiple Diagnosis Code(s):        --- Professional ---                           K44.9, Diaphragmatic hernia without obstruction or                            gangrene                           T54.94XA, Toxic effect of unspecified corrosive  substance, undetermined, initial encounter                           T28.7XXA, Corrosion of other parts of alimentary                            tract, initial encounter                           R93.5, Abnormal findings on diagnostic imaging of                            other abdominal regions, including retroperitoneum                           R93.3, Abnormal findings on diagnostic imaging of                            other parts of digestive tract CPT copyright 2022 [employer]. All rights reserved. The codes documented in this report are preliminary and upon coder review may  be revised to meet current compliance requirements. Oliva Boots, MD 11/30/2023 12:36:24 PM This report has been signed electronically. Number of Addenda: 0

## 2023-11-30 NOTE — Progress Notes (Signed)
 PT Cancellation Note  Patient Details Name: David Mcgrath MRN: [id number] DOB: 1950-04-21   Cancelled Treatment:    Reason Eval/Treat Not Completed: Medical issues which prohibited therapy Scheduled for EGD today.  Darice Potters PT Acute Rehabilitation Services Office [phone]   Potters Darice Norris 11/30/2023, 10:51 AM

## 2023-11-30 NOTE — Anesthesia Preprocedure Evaluation (Addendum)
 Anesthesia Evaluation  Patient identified by MRN, date of birth, ID band Patient awake    Reviewed: Allergy & Precautions, NPO status , Patient's Chart, lab work & pertinent test results  Airway Mallampati: III       Dental no notable dental hx.    Pulmonary sleep apnea    Pulmonary exam normal        Cardiovascular hypertension, Pt. on home beta blockers Normal cardiovascular exam+ dysrhythmias Atrial Fibrillation      Neuro/Psych  PSYCHIATRIC DISORDERS  Depression     Neuromuscular disease    GI/Hepatic Neg liver ROS,,,NG tube   Endo/Other  Multiple myeloma  Renal/GU Renal disease     Musculoskeletal negative musculoskeletal ROS (+)    Abdominal   Peds  Hematology  (+) Blood dyscrasia, anemia   Anesthesia Other Findings Rule out amyloid  Reproductive/Obstetrics                              Anesthesia Physical Anesthesia Plan  ASA: 3  Anesthesia Plan: MAC   Post-op Pain Management:    Induction:   PONV Risk Score and Plan: 1 and Treatment may vary due to age or medical condition  Airway Management Planned: Nasal Cannula  Additional Equipment:   Intra-op Plan:   Post-operative Plan:   Informed Consent: I have reviewed the patients History and Physical, chart, labs and discussed the procedure including the risks, benefits and alternatives for the proposed anesthesia with the patient or authorized representative who has indicated his/her understanding and acceptance.     Dental advisory given  Plan Discussed with: CRNA and Surgeon  Anesthesia Plan Comments:          Anesthesia Quick Evaluation

## 2023-11-30 NOTE — Progress Notes (Signed)
 Planning to see patient during infusion for nutrition follow-up. Chart reviewed. Patient is currently hospitalized. Nutrition appointment rescheduled for 9/18 during infusion.

## 2023-12-01 ENCOUNTER — Inpatient Hospital Stay (HOSPITAL_COMMUNITY)

## 2023-12-01 ENCOUNTER — Encounter (HOSPITAL_COMMUNITY): Payer: Self-pay | Admitting: Gastroenterology

## 2023-12-01 DIAGNOSIS — K567 Ileus, unspecified: Secondary | ICD-10-CM | POA: Diagnosis not present

## 2023-12-01 DIAGNOSIS — E785 Hyperlipidemia, unspecified: Secondary | ICD-10-CM | POA: Diagnosis not present

## 2023-12-01 DIAGNOSIS — E876 Hypokalemia: Secondary | ICD-10-CM | POA: Diagnosis not present

## 2023-12-01 DIAGNOSIS — C9000 Multiple myeloma not having achieved remission: Secondary | ICD-10-CM | POA: Diagnosis not present

## 2023-12-01 LAB — BASIC METABOLIC PANEL WITH GFR
Anion gap: 7 (ref 5–15)
BUN: 29 mg/dL — ABNORMAL HIGH (ref 8–23)
CO2: 23 mmol/L (ref 22–32)
Calcium: 7.9 mg/dL — ABNORMAL LOW (ref 8.9–10.3)
Chloride: 109 mmol/L (ref 98–111)
Creatinine, Ser: 0.44 mg/dL — ABNORMAL LOW (ref 0.61–1.24)
GFR, Estimated: >60 mL/min (ref >60)
Glucose, Bld: 113 mg/dL — ABNORMAL HIGH (ref 70–99)
Potassium: 4.1 mmol/L (ref 3.5–5.1)
Sodium: 140 mmol/L (ref 135–145)

## 2023-12-01 LAB — CBC
HCT: 24.8 % — ABNORMAL LOW (ref 39.0–52.0)
Hemoglobin: 7.8 g/dL — ABNORMAL LOW (ref 13.0–17.0)
MCH: 31.5 pg (ref 26.0–34.0)
MCHC: 31.5 g/dL (ref 30.0–36.0)
MCV: 100 fL (ref 80.0–100.0)
Platelets: 252 K/uL (ref 150–400)
RBC: 2.48 MIL/uL — ABNORMAL LOW (ref 4.22–5.81)
RDW: 19.1 % — ABNORMAL HIGH (ref 11.5–15.5)
WBC: 5.9 K/uL (ref 4.0–10.5)
nRBC: 0 % (ref 0.0–0.2)

## 2023-12-01 LAB — HEPARIN LEVEL (UNFRACTIONATED): Heparin Unfractionated: 0.4 [IU]/mL (ref 0.30–0.70)

## 2023-12-01 LAB — PHOSPHORUS: Phosphorus: 2.9 mg/dL (ref 2.5–4.6)

## 2023-12-01 LAB — GLUCOSE, CAPILLARY
Glucose-Capillary: 110 mg/dL — ABNORMAL HIGH (ref 70–99)
Glucose-Capillary: 98 mg/dL (ref 70–99)

## 2023-12-01 LAB — MAGNESIUM: Magnesium: 2.2 mg/dL (ref 1.7–2.4)

## 2023-12-01 MED ORDER — TRAVASOL 10 % IV SOLN
INTRAVENOUS | Status: AC
  Filled 2023-12-01: qty 1320

## 2023-12-01 NOTE — Plan of Care (Signed)

## 2023-12-01 NOTE — Progress Notes (Signed)
 PHARMACY - TOTAL PARENTERAL NUTRITION CONSULT NOTE   Indication: Bowel Obstruction  Patient Measurements: Height: 6' 4 (193 cm) Weight: 84.3 kg (185 lb 13.6 oz) IBW/kg (Calculated) : 86.8 TPN AdjBW (KG): 94.6 Body mass index is 22.62 kg/m. Usual Weight:   Assessment:  Pharmacy is consulted to start TPN on 73 yo male diagnosed with bowel obstruction. This admission CT abdomen shows increasing small bowel dilatation and fecalization of bowel contents involving the jejunum. No definitive transition point is identified at this time but caliber change is noted in the right mid abdomen at the junction of the jejunum and ileum. The more distal ileum is unremarkable. Some suspicion that bowel obstruction may be related to bortezomib  treatment pt receiving for multiple myeloma.   Glucose / Insulin : no hx of DM. Goal BG <150 - CBG range: 110 - 114 (no SSI used in last 24hrs) Electrolytes: All WNL, including CorrCa (9.0).  Renal: SCr < 1, BUN stable Hepatic: 8/28 labs: AST, Tbili WNL. ALT trending down. Alk Phos elevated, albumin  low Intake / Output; MIVF: Output decreased significantly -UOP: 850 mL, NG output: 0 mL, stool x1 -D5 @KVO  to expire 8/30 AM GI Imaging: -  Admitted from 10/14/23-10/20/23 for symptoms related to small bowel obstruction (transition point suspected in the right hemi-abdomen on CT imaging) and received non-operative management.  - Admitted 11/02/23 - 11/05/2023 for small bowel dilatation/concern for small bowel obstruction. CT imaging showed persistent small bowel dilatation with mild transition point noted deep in pelvis. He went to OR 11/03/23, findings were suspicious for intermittent volvulus of small bowel to explain his symptoms and CT imaging.  - 8/10  CT abdomen shows increasing small bowel dilatation and fecalization of bowel contents involving the jejunum. No definitive transition point is identified at this time but caliber change is noted in the right mid abdomen at the  junction of the jejunum and ileum.  GI Surgeries / Procedures:  -8/28: EGD, enteroscopy - small hiatal hernia, gastritis, duodenum/jejunum appeared normal; several biopsies taken  Central access:  PICC 8/18   Nutritional Goals: Goal TPN rate is 100  mL/hr (provides 132 g of protein and 2438 kcals per day)  RD Assessment:  Estimated Needs Total Energy Estimated Needs: 2400-2600 kcals Total Protein Estimated Needs: 120-135 grams Total Fluid Estimated Needs: >/= 2.4L  Current Nutrition:  NPO and TPN  Plan:  At 18:00 Continue TPN at 100 mL/hr Electrolytes in TPN:  Na 0 mEq/L K 40 mEq/L Ca 2.5 mEq/L Mg 5 mEq/L Phos 15 mmol/L  Cl:Ac max acetate Add standard MVI and trace elements to TPN Continue q8h Sensitive SSI and adjust as needed.  mIVF management per MD Monitor TPN labs on Mon/Thurs. Recheck electrolytes with AM labs tomorrow.  Per GI, repeat abd X-ray today and consider starting patient on CLD if imaging shows improvement    Lacinda Moats, PharmD Clinical Pharmacist  8/29/20257:19 AM

## 2023-12-01 NOTE — Progress Notes (Signed)
 PHARMACY - ANTICOAGULATION CONSULT NOTE  Pharmacy Consult for heparin  Indication: atrial fibrillation  Allergies  Allergen Reactions   Shrimp [Shellfish Allergy] Anaphylaxis and Swelling    Swelling throat    Reglan  [Metoclopramide ] Other (See Comments)    caused tardive dyskinesia    Patient Measurements: Height: 6' 4 (193 cm) Weight: 81.4 kg (179 lb 7.3 oz) IBW/kg (Calculated) : 86.8 HEPARIN  DW (KG): 94.6  Vital Signs: Temp: 97.6 F (36.4 C) (08/29 0014) Temp Source: Axillary (08/29 0014) BP: 137/62 (08/29 0000) Pulse Rate: 77 (08/29 0000)  Labs: Recent Labs    11/28/23 0504 11/29/23 0643 11/29/23 0644 11/29/23 1536 11/30/23 0000 11/30/23 0500 12/01/23 0122  HGB  --  8.1*  --   --   --  8.3*  --   HCT  --  27.4*  --   --   --  27.6*  --   PLT  --  279  --   --   --  287  --   HEPARINUNFRC 0.62  --    < > 0.41 0.62  --  0.40  CREATININE 0.58* 0.56*  --   --   --  0.55*  --    < > = values in this interval not displayed.    Estimated Creatinine Clearance: 96.1 mL/min (A) (by C-G formula based on SCr of 0.55 mg/dL (L)).   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  Patient reports he does not have symptoms as of 02/25/14.     Diarrhea 06/04/2023   Essential hypertension 06/04/2023   Fatty liver 06/04/2023   Flu 06/04/2023   Hyperlipidemia 06/04/2023   Ileus (HCC) 06/04/2023   Multiple myeloma (HCC)    Neuropathy    Paroxysmal atrial fibrillation (HCC) 07/04/2023   Pneumonia 06/04/2023     Assessment: 73 year old male with afib on Eliquis  PTA which is currently on hold. Patient recently admitted for bowel obstruction and discharged home after conservative treatment with NGT decompression. He presented back with similar  concerns, is currently on TPN.   Patient is currently on heparin  drip while apixaban  remains on hold.   Significant Events: -8/27: Several nosebleeds overnight. Op note today mentions mild NGT trauma. NG removed.  -8/28: UFH off at 06:00 for enteroscopy            UFH resumed at 17:03  Today, 12/01/23 Heparin  level = 0.4 (therapeutic) with heparin  gtt @ 1900 units/hr CBC not yet drawn this AM Per RN - no bleeding complications noted  Goal of Therapy:  Heparin  level 0.3-0.7 units/ml Monitor platelets by anticoagulation protocol: Yes   Plan:  Continue heparin  at 1900 units/hr  CBC, heparin  level daily Monitor for signs of bleeding  Arvin Gauss, PharmD 12/01/23 3:34 AM

## 2023-12-01 NOTE — Anesthesia Postprocedure Evaluation (Signed)
 Anesthesia Post Note  Patient: David Mcgrath  Procedure(s) Performed: ENTEROSCOPY     Patient location during evaluation: Endoscopy Anesthesia Type: MAC Level of consciousness: awake Pain management: pain level controlled Vital Signs Assessment: post-procedure vital signs reviewed and stable Respiratory status: spontaneous breathing, nonlabored ventilation and respiratory function stable Cardiovascular status: blood pressure returned to baseline and stable Postop Assessment: no apparent nausea or vomiting Anesthetic complications: no   No notable events documented.  Last Vitals:  Vitals:   12/01/23 0014 12/01/23 0437  BP:    Pulse:    Resp:    Temp: 36.4 C (!) 36.3 C  SpO2:      Last Pain:  Vitals:   12/01/23 0505  TempSrc:   PainSc: Asleep                 Shala Baumbach P Gareld Obrecht

## 2023-12-01 NOTE — Progress Notes (Signed)
 PROGRESS NOTE    David Mcgrath  FMW:[id number] DOB: 1951/03/28 DOA: 11/12/2023 PCP: [name], DO    Chief Complaint  Patient presents with   Weakness   Dizziness   Diarrhea         Brief Narrative:  73 y.o. male with medical history significant for hypertension, hyperlipidemia, paroxysmal A-fib on Eliquis , chronic anxiety/depression, polyneuropathy, multiple myeloma (diagnosed March 2025) received Bortezomib  infusion 11/09/2023. Patient was admitted here 11/02/2023 through 11/05/2023 with intractable nausea vomiting, recurrent small bowel obstruction.  He was treated conservatively with NG tube decompression and discharged home.     Last admission CT abdomen/pelvis with contrast with slight improvement in small bowel dilation when compared to prior exam but with mild transition point deep in the pelvis, no pancreatic ductal dilation, no inflammatory changes, stable free fluid within the abdomen.  General surgery was consulted and patient underwent diagnostic laparoscopy by Dr. [name] on 11/03/2023 findings of intermittently dilated small bowel with erythema of the serosa consistent with inflammation/edema, intermittent injection of the small bowel mesentery, small bowel with tendency to migrate to the left upper quadrant with long/mobile small bowel mesentery with suspicion/potential for intermittent volvulized small bowel contributing to his symptoms and findings on imaging.  Diet was slowly advanced with toleration.     This admission CT abdomen shows increasing small bowel dilatation and fecalization of bowel contents involving the jejunum. No definitive transition point is identified at this time but caliber change is noted in the right mid abdomen at the junction of the jejunum and ileum. The more distal ileum is unremarkable. Stable ground-glass opacities in the lung bases similar to that seen on prior exams.    **Interim History Has had a persistent ileus and continues  to  have gastric distention. Unfortunately lost his NG tube on 8/21 but had to be replaced by IR.  Now connected back to [facility]WS.  Underwent a upper GI series with small bowel follow-through and this shows contrast in the stomach with delayed gastric emptying.  Subsequently there was contrast in the rectum suggesting no complete obstruction.  GI recommending continuing erythromycin  for now and repeating abdominal x-rays and considering EGD early next week (Monday vs Tuesday) to rule out pyloric stricture and recommending ambulation.   8/25: Patient was admitted on 8/10 and I assumed care today on 8/25.  Please review prior notes.  Most of the stuff carried on for continued of care.  Further management as below.   Assessment & Plan:   Principal Problem:   Ileus (HCC) Active Problems:   Hypokalemia   Paroxysmal atrial fibrillation (HCC)   Multiple myeloma (HCC)   Essential hypertension   Depression   Hyperlipidemia   Pressure injury of skin   Protein-calorie malnutrition, severe   Hypernatremia   Swelling of lower extremity   Transaminitis   Acute urinary retention  #1 ileus versus partial small bowel obstruction -Patient noted to have presented with nausea vomiting diarrhea. - CT abdomen and pelvis done concerning ileus versus partial SBO. - Patient noted to have been treated conservatively. - NG tube not initially placed. - Patient initially was improving around the hospitalization and had further recurrence of nausea and vomiting. - Repeat CT abdomen and pelvis done with persistent small bowel dilatation with prominent jejunum. - Patient started a trial with erythromycin  on 11/18/2023 but was not successful as patient noted to began having large biliary emesis on 11/18/2023. - Due to allergy to Reglan  patient was placed on IV erythromycin  and dose  increased to 500 mg 3 times daily. - NG tube initially placed 11/18/2023 and subsequently had to be replaced 11/23/2023. - Patient seen in  consultation by general surgery and did not feel patient needed any repeat laparoscopy as deferred would not be beneficial. - Due to patient's prolonged hospitalization minimal nutrition patient started on TPN after PICC line was placed on 11/20/2023. - Patient subsequently underwent UGI with SBFT per surgery to assess for transit and patency which showed contrast present in the stomach with delayed gastric emptying. - Narcotics are to be minimized. - Patient seen in consultation by GI review might take several days to weeks for issue to resolve. - KUB done 11/24/2023 showed minimal bowel contrast material demonstrated.  There appears to be contrast material in the rectum suggesting no evidence of complete obstruction.  Gaseous distention of small bowel.  Gas-filled colon most likely ileus. - Repeat abdominal films done with similar diffuse gaseous distention of the bowel and abdomen and pelvis compared to prior films. - GI reconsulted and following the patient status post small bowel enteroscopy/EGD 11/30/2023 with small hiatal hernia, caustic gastritis with biopsies taken.  - NG tube discontinued postprocedure and patient with no nausea or emesis no abdominal pain.  -Abdominal x-ray ordered per GI to determine whether patient can be started on clears today. - Patient was on erythromycin  and noted to unable to tolerate Reglan .  - Continue mobilization to stimulate gastric intestinal motility with narcotic avoidance.  - Continue IV PPI.  - Per GI.    2.  Hypotension - Resolved with hydration.  3.  Lactic acidosis -Resolved.  4.  Leukocytosis -Likely reactive, patient noted to have large GI losses on 8/16 and subsequently became hypotensive with elevated lactic acid levels. - Patient placed on IV fluids with a bolus and maintenance fluids with normalization of lactic acid on 11/19/2023. - Low suspicion for infection. - Status post IV fluids. - Continue to hold antibiotics at this time as no  further leukocytosis, patient afebrile - Patient noted to have some orthostatic hypotension on 11/28/2023 while working with PT/OT and advised Ace wrap/TED hose while working with PT.  5.  Diarrhea -Patient noted to have reported semiformed stools. - GI pathogen panel negative. - Patient likely colonized with C. difficile as testing was negative for any active infection (antigen positive, toxin negative, PCR negative). - Follow.  6.  Acute urinary retention -Foley catheter placed 11/13/2023. - Voiding trial tomorrow.   7.  History of multiple myeloma -Per Dr. Von he was found somewhat patient symptoms thought to be due to chemotherapeutic medications and oncology reengaged to see if they had any further recommendations.   -Outpatient follow-up with oncology.  8.  Paroxysmal A-fib -Not on any rate limiting medications. -Currently in normal sinus rhythm and rate controlled. - Patient noted to be on metoprolol  at home but on a as needed basis. - Was on Eliquis  which currently on hold as patient underwent EGD/small bowel enteroscopy, 11/30/2023. -Heparin  drip held preprocedure and resumed postprocedure.  9.  Left finger discoloration -Resolved. - Noted to have had some discoloration of the left fingers on 8/18 however wife stated that was present on 8/17 and actually had improved on 8/18 and subsequently 8/20. - Concern was for cyanosis of his fingertips as they were also cold. - Patient noted to to have PICC line placed in the left upper extremity 8/18 however wife stated discoloration was present prior to that; there was also some extravasation from prior IV in the  left arm as well but unclear what was transfusing as it happened prior to transfer to the ICU. - Left upper extremity arterial Dopplers were done with no obstruction of the left upper extremity with triphasic waves. - Patient seen in consultation by vascular surgery who felt no surgical intervention needed at this time. -  Resolution of finger discoloration 8/19.  10.  Normocytic anemia -Patient with no significant overt bleeding. - Hemoglobin stable at 7.8. - Follow H&H. - Transfusion threshold hemoglobin < 8.  11.  Hypernatremia -Likely secondary to dehydration. - Patient currently on TPN. - Improved with D5W which has been KVO.  12.  Hypokalemia/hypomagnesemia/hyponatremia -Hyponatremia resolved patient now currently hyponatremic. - Patient on TPN. - Potassium now at 4.1.  Phosphorus at 2.9, magnesium  at 2.2. - Repeat labs in AM.  13.  Abnormal LFTs -Acute hepatitis panel negative. - LFTs trending back down. - Follow-up.  14.  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 - Status post IV Lasix  x 1. - IVF saline lock.  15.  Overweight -BMI of 22.43 kg/m. - Lifestyle modification - Outpatient follow-up with PCP.  16.  Severe protein calorie malnutrition -Currently NPO. - Continue TPN per pharmacy.   DVT prophylaxis: Heparin  GTT Code Status: Full Family Communication: Updated patient and wife at bedside. Disposition: TBD  Status is: Inpatient Remains inpatient appropriate because: Severity of of illness   Consultants:  Gastroenterology: Dr. Kriss 11/19/2023 Vascular surgery: Dr.Robins 11/20/2023 General Surgery Interventional radiology  Procedures:  CT abdomen and pelvis 11/13/2023, 11/16/2023 Upper extremity arterial duplex, left 11/21/2023 Bilateral lower extremity Dopplers 11/26/2023 SBO protocol 11/17/2023 PICC line Small bowel enteroscopy/EGD: Per GI: Dr. Rosalie 11/30/2023  Antimicrobials:  Anti-infectives (From admission, onward)    Start     Dose/Rate Route Frequency Ordered Stop   11/27/23 1200  erythromycin  500  mg in sodium chloride  0.9 % 100 mL IVPB        500 mg 100 mL/hr over 60 Minutes Intravenous Every 6 hours 11/27/23 0933 11/29/23 0723   11/24/23 1400  erythromycin  500 mg in sodium chloride  0.9 % 100 mL IVPB  Status:  Discontinued        500 mg 100 mL/hr over 60 Minutes Intravenous Every 8 hours 11/24/23 1027 11/27/23 0933   11/23/23 2000  erythromycin  250 mg in sodium chloride  0.9 % 100 mL IVPB  Status:  Discontinued        250 mg 100 mL/hr over 60 Minutes Intravenous Every 8 hours 11/23/23 1744 11/24/23 1027   11/23/23 1200  erythromycin  (E-MYCIN ) tablet 250 mg  Status:  Discontinued        250 mg Oral 3 times daily before meals 11/23/23 1004 11/23/23 1744   11/18/23 1200  erythromycin  (E-MYCIN ) tablet 250 mg  Status:  Discontinued        250 mg Oral 2 times daily with meals 11/18/23 0720 11/19/23 0734   11/13/23 1000  acyclovir  (ZOVIRAX ) tablet 400 mg  Status:  Discontinued        400 mg Oral 2 times daily 11/13/23 0503 11/19/23 0739         Subjective: Patient sitting up in bed.  Denies any chest pain or shortness of breath.  Denies any abdominal pain.  Passing flatus.  Unsure as to whether he may have  had a bowel movement however per wife RN may have stated that patient may have some clear stool.  Denies any nausea or vomiting.  NG tube discontinued yesterday prior to endoscopy.    Objective: Vitals:   12/01/23 0437 12/01/23 0505 12/01/23 0600 12/01/23 0800  BP:  132/65 (!) 126/58   Pulse:  83 76   Resp:  (!) 22 18   Temp: (!) 97.4 F (36.3 C)   98.2 F (36.8 C)  TempSrc: Axillary   Axillary  SpO2:  96% 97%   Weight:      Height:        Intake/Output Summary (Last 24 hours) at 12/01/2023 0951 Last data filed at 12/01/2023 0700 Gross per 24 hour  Intake 2195.2 ml  Output 700 ml  Net 1495.2 ml   Filed Weights   11/27/23 0500 11/28/23 0500 12/01/23 0436  Weight: 85.1 kg 81.4 kg 84.3 kg    Examination:  General exam: NAD.  Respiratory system: Some bibasilar  crackles.  No wheezing.  No rhonchi.  Fair air movement.  Speaking in full sentences.  No use of accessory muscles of respiration.  Cardiovascular system: RRR no murmurs rubs or gallops.  No JVD.  Trace to 1+ bilateral lower extremity edema. Gastrointestinal system: Abdomen is soft, nontender, nondistended, positive bowel sounds.  No rebound.  No guarding.  Central nervous system: Alert and oriented. No focal neurological deficits. Extremities: Symmetric 5 x 5 power. Skin: No rashes, lesions or ulcers Psychiatry: Judgement and insight appear normal. Mood & affect appropriate.     Data Reviewed: I have personally reviewed following labs and imaging studies  CBC: Recent Labs  Lab 11/25/23 0523 11/26/23 0440 11/27/23 0510 11/29/23 0643 11/30/23 0500 12/01/23 0430  WBC 7.0 5.9 5.9 6.9 6.9 5.9  NEUTROABS 5.2 4.3 4.3  --   --   --   HGB 8.5* 8.5* 8.5* 8.1* 8.3* 7.8*  HCT 26.1* 26.1* 27.0* 27.4* 27.6* 24.8*  MCV 94.6 94.6 96.1 100.7* 101.5* 100.0  PLT 186 204 249 279 287 252    Basic Metabolic Panel: Recent Labs  Lab 11/26/23 0440 11/27/23 0510 11/28/23 0504 11/29/23 0643 11/30/23 0500 12/01/23 0430  NA 138 143 147* 149* 144 140  K 3.8 3.3* 3.5 4.2 4.1 4.1  CL 96* 101 104 114* 112* 109  CO2 33* 36* 35* 27 23 23   GLUCOSE 127* 132* 129* 122* 120* 113*  BUN 23 26* 28* 33* 33* 29*  CREATININE 0.57* 0.53* 0.58* 0.56* 0.55* 0.44*  CALCIUM  8.1* 8.2* 8.4* 8.5* 8.1* 7.9*  MG 2.3 2.3  --  2.5* 2.3 2.2  PHOS 4.9* 4.6  --  3.5 3.4 2.9    GFR: Estimated Creatinine Clearance: 99.5 mL/min (A) (by C-G formula based on SCr of 0.44 mg/dL (L)).  Liver Function Tests: Recent Labs  Lab 11/25/23 0523 11/26/23 0440 11/27/23 0510 11/30/23 0500  AST 73* 73* 50* 36  ALT 64* 114* 94* 76*  ALKPHOS 70 80 94 142*  BILITOT 0.4 0.4 0.4 0.5  PROT 4.8* 5.1* 5.4* 5.5*  ALBUMIN  1.6* 1.8* 2.0* 2.5*    CBG: Recent Labs  Lab 11/29/23 2344 11/30/23 0813 11/30/23 1628 11/30/23 2357  12/01/23 0547  GLUCAP 109* 110* 112* 114* 110*     No results found for this or any previous visit (from the past 240 hours).       Radiology Studies: No results found.      Scheduled Meds:  butamben -tetracaine -benzocaine   1 spray Topical Once  Chlorhexidine  Gluconate Cloth  6 each Topical Q2200   fluticasone   1 spray Each Nare Daily   insulin  aspart  0-9 Units Subcutaneous Q8H   oxymetazoline   1 spray Each Nare BID   pantoprazole  (PROTONIX ) IV  40 mg Intravenous Q12H   sodium chloride  flush  10-40 mL Intracatheter Q12H   Continuous Infusions:  dextrose  10 mL/hr at 11/30/23 0825   heparin  1,900 Units/hr (12/01/23 0533)   TPN ADULT (ION) 100 mL/hr at 12/01/23 0533   TPN ADULT (ION)       LOS: 18 days    Time spent: 40 minutes    Toribio Hummer, MD [facility]   To contact the attending provider between 7A-7P or the covering provider during after hours 7P-7A, please log into the web site [email or web address] and access using universal [place] password for that web site. If you do not have the password, please call the hospital operator.  12/01/2023, 9:51 AM

## 2023-12-01 NOTE — Progress Notes (Signed)
 Subjective: Patient states he had a small bowel movement described as passage of mucus yesterday. Since NG tube was removed post EGD/enteroscopy he has not had any nausea, vomiting or abdominal distention. He is requesting to see if he can have some clear liquids or water to drink.  Objective: Vital signs in last 24 hours: Temp:  [96.9 F (36.1 C)-98.2 F (36.8 C)] 98.2 F (36.8 C) (08/29 0800) Pulse Rate:  [75-101] 76 (08/29 0600) Resp:  [15-30] 18 (08/29 0600) BP: (68-173)/(37-95) 126/58 (08/29 0600) SpO2:  [96 %-100 %] 97 % (08/29 0600) Weight:  [84.3 kg] 84.3 kg (08/29 0436) Weight change:  Last BM Date : 11/30/23  PE: Not in distress GENERAL: Prominent pallor ABDOMEN: Soft, nondistended, nontender abdomen, bowel sounds sluggish EXTREMITIES: No deformity  Lab Results: Results for orders placed or performed during the hospital encounter of 11/12/23 (from the past 48 hours)  Glucose, capillary     Status: Abnormal   Collection Time: 11/29/23 12:12 PM  Result Value Ref Range   Glucose-Capillary 119 (H) 70 - 99 mg/dL    Comment: Glucose reference range applies only to samples taken after fasting for at least 8 hours.  Heparin  level (unfractionated)     Status: None   Collection Time: 11/29/23  3:36 PM  Result Value Ref Range   Heparin  Unfractionated 0.41 0.30 - 0.70 IU/mL    Comment: (NOTE) The clinical reportable range upper limit is being lowered to >1.10 to align with the FDA approved guidance for the current laboratory assay.  If heparin  results are below expected values, and patient dosage has  been confirmed, suggest follow up testing of antithrombin III levels. Performed at [facility], [address], [place], [place] [address]   Glucose, capillary     Status: Abnormal   Collection Time: 11/29/23  3:55 PM  Result Value Ref Range   Glucose-Capillary 123 (H) 70 - 99 mg/dL    Comment: Glucose reference range applies only to samples taken after  fasting for at least 8 hours.  Glucose, capillary     Status: Abnormal   Collection Time: 11/29/23 11:44 PM  Result Value Ref Range   Glucose-Capillary 109 (H) 70 - 99 mg/dL    Comment: Glucose reference range applies only to samples taken after fasting for at least 8 hours.  Heparin  level (unfractionated)     Status: None   Collection Time: 11/30/23 12:00 AM  Result Value Ref Range   Heparin  Unfractionated 0.62 0.30 - 0.70 IU/mL    Comment: (NOTE) The clinical reportable range upper limit is being lowered to >1.10 to align with the FDA approved guidance for the current laboratory assay.  If heparin  results are below expected values, and patient dosage has  been confirmed, suggest follow up testing of antithrombin III levels. Performed at [facility], [address] [place], [place] [address]   Comprehensive metabolic panel     Status: Abnormal   Collection Time: 11/30/23  5:00 AM  Result Value Ref Range   Sodium 144 135 - 145 mmol/L   Potassium 4.1 3.5 - 5.1 mmol/L   Chloride 112 (H) 98 - 111 mmol/L   CO2 23 22 - 32 mmol/L   Glucose, Bld 120 (H) 70 - 99 mg/dL    Comment: Glucose reference range applies only to samples taken after fasting for at least 8 hours.   BUN 33 (H) 8 - 23 mg/dL   Creatinine, Ser 9.44 (L) 0.61 - 1.24 mg/dL  Calcium  8.1 (L) 8.9 - 10.3 mg/dL   Total Protein 5.5 (L) 6.5 - 8.1 g/dL   Albumin  2.5 (L) 3.5 - 5.0 g/dL   AST 36 15 - 41 U/L   ALT 76 (H) 0 - 44 U/L   Alkaline Phosphatase 142 (H) 38 - 126 U/L   Total Bilirubin 0.5 0.0 - 1.2 mg/dL   GFR, Estimated >39 >39 mL/min    Comment: (NOTE) Calculated using the CKD-EPI Creatinine Equation (2021)    Anion gap 10 5 - 15    Comment: Performed at [facility], [address], [place], [place] [address]  Magnesium      Status: None   Collection Time: 11/30/23  5:00 AM  Result Value Ref Range   Magnesium  2.3 1.7 - 2.4 mg/dL    Comment: Performed at [facility], [address], [place], [place] [address]  Phosphorus     Status: None   Collection Time: 11/30/23  5:00 AM  Result Value Ref Range   Phosphorus 3.4 2.5 - 4.6 mg/dL    Comment: Performed at [facility], [address], [place], [place] [address]  CBC     Status: Abnormal   Collection Time: 11/30/23  5:00 AM  Result Value Ref Range   WBC 6.9 4.0 - 10.5 K/uL   RBC 2.72 (L) 4.22 - 5.81 MIL/uL   Hemoglobin 8.3 (L) 13.0 - 17.0 g/dL   HCT 72.3 (L) 60.9 - 47.9 %   MCV 101.5 (H) 80.0 - 100.0 fL   MCH 30.5 26.0 - 34.0 pg   MCHC 30.1 30.0 - 36.0 g/dL   RDW 80.4 (H) 88.4 - 84.4 %   Platelets 287 150 - 400 K/uL   nRBC 0.0 0.0 - 0.2 %    Comment: Performed at [facility], [address] [place], [place] [address]  Glucose, capillary     Status: Abnormal   Collection Time: 11/30/23  8:13 AM  Result Value Ref Range   Glucose-Capillary 110 (H) 70 - 99 mg/dL    Comment: Glucose reference range applies only to samples taken after fasting for at least 8 hours.   Comment 1 Notify RN    Comment 2 Document in Chart   Glucose, capillary     Status: Abnormal   Collection Time: 11/30/23  4:28 PM  Result Value Ref Range   Glucose-Capillary 112 (H) 70 - 99 mg/dL    Comment: Glucose reference range applies only to samples taken after fasting for at least 8 hours.   Comment 1 Notify RN    Comment 2 Document in Chart   Glucose, capillary     Status: Abnormal   Collection Time: 11/30/23 11:57 PM  Result Value Ref Range   Glucose-Capillary 114 (H) 70 - 99 mg/dL    Comment: Glucose reference range applies only to samples taken after fasting for at least 8 hours.  Heparin  level (unfractionated)     Status: None   Collection Time: 12/01/23  1:22 AM  Result Value Ref Range   Heparin  Unfractionated 0.40 0.30 - 0.70 IU/mL    Comment: (NOTE) The clinical reportable range upper limit is being lowered to >1.10 to align with the FDA approved guidance  for the current laboratory assay.  If heparin  results are below expected values, and patient dosage has  been confirmed, suggest follow up testing of antithrombin III levels. Performed at [facility], [address], [place], [place] [address]  CBC     Status: Abnormal   Collection Time: 12/01/23  4:30 AM  Result Value Ref Range   WBC 5.9 4.0 - 10.5 K/uL   RBC 2.48 (L) 4.22 - 5.81 MIL/uL   Hemoglobin 7.8 (L) 13.0 - 17.0 g/dL   HCT 75.1 (L) 60.9 - 47.9 %   MCV 100.0 80.0 - 100.0 fL   MCH 31.5 26.0 - 34.0 pg   MCHC 31.5 30.0 - 36.0 g/dL   RDW 80.8 (H) 88.4 - 84.4 %   Platelets 252 150 - 400 K/uL   nRBC 0.0 0.0 - 0.2 %    Comment: Performed at [facility], [address], [place], [place] [address]  Basic metabolic panel with GFR     Status: Abnormal   Collection Time: 12/01/23  4:30 AM  Result Value Ref Range   Sodium 140 135 - 145 mmol/L   Potassium 4.1 3.5 - 5.1 mmol/L   Chloride 109 98 - 111 mmol/L   CO2 23 22 - 32 mmol/L   Glucose, Bld 113 (H) 70 - 99 mg/dL    Comment: Glucose reference range applies only to samples taken after fasting for at least 8 hours.   BUN 29 (H) 8 - 23 mg/dL   Creatinine, Ser 9.55 (L) 0.61 - 1.24 mg/dL   Calcium  7.9 (L) 8.9 - 10.3 mg/dL   GFR, Estimated >39 >39 mL/min    Comment: (NOTE) Calculated using the CKD-EPI Creatinine Equation (2021)    Anion gap 7 5 - 15    Comment: Performed at [facility], [address], [place], [place] [address]  Magnesium      Status: None   Collection Time: 12/01/23  4:30 AM  Result Value Ref Range   Magnesium  2.2 1.7 - 2.4 mg/dL    Comment: Performed at [facility], [address] [place], [place] [address]  Phosphorus     Status: None   Collection Time: 12/01/23  4:30 AM  Result Value Ref Range   Phosphorus 2.9 2.5 - 4.6 mg/dL    Comment: Performed at [facility], [address] [place], [place] [address]   Glucose, capillary     Status: Abnormal   Collection Time: 12/01/23  5:47 AM  Result Value Ref Range   Glucose-Capillary 110 (H) 70 - 99 mg/dL    Comment: Glucose reference range applies only to samples taken after fasting for at least 8 hours.    Studies/Results: No results found.  Medications: I have reviewed the patient's current medications.  Assessment: Status post enteroscopy yesterday, fairly unremarkable, gastric and small bowel biopsies taken, results pending.  Abdominal x-ray 11/27/2023: No improvement in bowel gas pattern with dilated gas containing small bowel loops and right abdomen and nondilated gas containing large bowel including in the rectum, favor persistent partial small bowel obstruction  Upper GI series and small bowel follow-through 11/24/2023: Delayed gastric emptying  CT 11/17/2023: Persistent small bowel dilation involving jejunum  Diagnostic laparoscopy 11/03/2023: Small bowel intermittently dilated and injected consistent with inflammation, mesenteric edema, ascites without obstruction, mobile small bowel mesentery concerning for intermittent volvulus  Potassium normal at 4.1 Mildly elevated ALT of 75 and ALP of 142 Macrocytic anemia, hemoglobin 7.8, MCV 100, platelet 252  Plan: Repeat abdominal x-ray today, if shows improvement, will consider starting patient on clear liquid diet.  Previously was on erythromycin  and reported as intolerant to Reglan   Continued on IV TPN currently  Will follow results of abdominal  x-ray.  Recommend mobilization, avoiding narcotics, keeping magnesium  above 2 and potassium above 4. Estelita Manas, MD 12/01/2023, 8:44 AM

## 2023-12-01 NOTE — Progress Notes (Signed)
 Physical Therapy Treatment Patient Details Name: David Mcgrath MRN: [id number] DOB: [birth year] Today's Date: 12/01/2023   History of Present Illness Pt is 73 yo male who presented on 11/12/23 with nausea, vomiting, diarrhea, hypotension.  CT raised concern for ileus vs partial SBO.  Pt recently admitted 7/31-8/3 for SBO, pt underwent diagnostic laparoscopy on 8/1.  This admission pt with persistent ileus, treated with NG tube 8/16 and replaced 8/21.  Surgery consulted and did not feel repeat laparoscopy beneficial.  TPN started 8/18. GI reconsulted for possible EGD but continuing erythromycin  for now. Pt also with hypotension, urinary retention, PAF, hyponatremia/hypokalemia/hypomagnesemia/hypoglycemia.  Pt also with LE edema - findings consistent with chronic DVT.  Pt with hx including but not limited to  hypertension, hyperlipidemia, paroxysmal A-fib on Eliquis , chronic anxiety/depression, polyneuropathy, multiple myeloma (diagnosed March 2025) received Bortezomib  infusion 11/09/2023.    PT Comments   Patient alert, cheerful, talkative and ready to mobilize to recliner.  Patient required mod support to sit up onto bed edge with HOB raised, +2 mod support to stand and step to recliner using RW. Noted very weak legs, knees buckling .  Patient will benefit from continued inpatient follow up therapy, <3 hours/day.  Patient had not reports of dizziness.    BP semisitting in bed, 145/64, after transfer   to recliner 108/61, HR 93, RR 33      If plan is discharge home, recommend the following: Assistance with cooking/housework;Help with stairs or ramp for entrance;Assist for transportation;Two people to help with walking and/or transfers;A lot of help with bathing/dressing/bathroom   Can travel by private vehicle     No  Equipment Recommendations  None recommended by PT    Recommendations for Other Services       Precautions / Restrictions Precautions Precautions: Fall Recall of  Precautions/Restrictions: Intact Precaution/Restrictions Comments: , watch BP, TEDS Restrictions Weight Bearing Restrictions Per Provider Order: No     Mobility  Bed Mobility Overal bed mobility: Needs Assistance Bed Mobility: Supine to Sit   Sidelying to sit: Mod assist, Used rails, HOB elevated       General bed mobility comments: slow position changs, cues, use of bedrails, assist to scoot to bed edge    Transfers Overall transfer level: Needs assistance Equipment used: Rolling walker (2 wheels) Transfers: Bed to chair/wheelchair/BSC, Sit to/from Stand Sit to Stand: Mod assist, +2 physical assistance, +2 safety/equipment, From elevated surface   Step pivot transfers: Mod assist, +2 physical assistance, +2 safety/equipment, From elevated surface       General transfer comment: steady support, decreased stepping with LLE, small steps to turn to recliner, decreased control of descent.    Ambulation/Gait                   Stairs             Wheelchair Mobility     Tilt Bed    Modified Rankin (Stroke Patients Only)       Balance Overall balance assessment: Needs assistance Sitting-balance support: Bilateral upper extremity supported, Feet supported Sitting balance-Leahy Scale: Fair     Standing balance support: During functional activity, Reliant on assistive device for balance, Bilateral upper extremity supported Standing balance-Leahy Scale: Poor                              Communication Communication Communication: No apparent difficulties  Cognition Arousal: Alert Behavior During Therapy: Anxious   PT -  Cognitive impairments: No apparent impairments                       PT - Cognition Comments: likes to have explanation of what he will be doing, Following commands: Intact      Cueing Cueing Techniques: Verbal cues  Exercises      General Comments        Pertinent Vitals/Pain Pain Assessment Pain  Assessment: No/denies pain    Home Living                          Prior Function            PT Goals (current goals can now be found in the care plan section) Progress towards PT goals: Progressing toward goals    Frequency    Min 2X/week      PT Plan      Co-evaluation              AM-PAC PT 6 Clicks Mobility   Outcome Measure  Help needed turning from your back to your side while in a flat bed without using bedrails?: A Lot Help needed moving from lying on your back to sitting on the side of a flat bed without using bedrails?: A Lot Help needed moving to and from a bed to a chair (including a wheelchair)?: A Lot Help needed standing up from a chair using your arms (e.g., wheelchair or bedside chair)?: A Lot Help needed to walk in hospital room?: Total Help needed climbing 3-5 steps with a railing? : Total 6 Click Score: 10    End of Session Equipment Utilized During Treatment: Gait belt Activity Tolerance: Patient tolerated treatment well Patient left: in chair;with call bell/phone within reach;with chair alarm set Nurse Communication: Mobility status;Other (comment) PT Visit Diagnosis: Unsteadiness on feet (R26.81);Other abnormalities of gait and mobility (R26.89);Muscle weakness (generalized) (M62.81)     Time: 8843-8773 PT Time Calculation (min) (ACUTE ONLY): 30 min  Charges:    $Therapeutic Activity: 23-37 mins PT General Charges $$ ACUTE PT VISIT: 1 Visit                    Darice Potters PT Acute Rehabilitation Services Office [phone]    Potters Darice Norris 12/01/2023, 3:22 PM

## 2023-12-02 ENCOUNTER — Inpatient Hospital Stay (HOSPITAL_COMMUNITY)

## 2023-12-02 DIAGNOSIS — R935 Abnormal findings on diagnostic imaging of other abdominal regions, including retroperitoneum: Secondary | ICD-10-CM | POA: Diagnosis not present

## 2023-12-02 DIAGNOSIS — E785 Hyperlipidemia, unspecified: Secondary | ICD-10-CM | POA: Diagnosis not present

## 2023-12-02 DIAGNOSIS — Z4682 Encounter for fitting and adjustment of non-vascular catheter: Secondary | ICD-10-CM | POA: Diagnosis not present

## 2023-12-02 DIAGNOSIS — C9000 Multiple myeloma not having achieved remission: Secondary | ICD-10-CM | POA: Diagnosis not present

## 2023-12-02 DIAGNOSIS — E876 Hypokalemia: Secondary | ICD-10-CM | POA: Diagnosis not present

## 2023-12-02 DIAGNOSIS — K567 Ileus, unspecified: Secondary | ICD-10-CM | POA: Diagnosis not present

## 2023-12-02 LAB — GLUCOSE, CAPILLARY
Glucose-Capillary: 100 mg/dL — ABNORMAL HIGH (ref 70–99)
Glucose-Capillary: 101 mg/dL — ABNORMAL HIGH (ref 70–99)
Glucose-Capillary: 89 mg/dL (ref 70–99)
Glucose-Capillary: 94 mg/dL (ref 70–99)

## 2023-12-02 LAB — BASIC METABOLIC PANEL WITH GFR
Anion gap: 9 (ref 5–15)
Anion gap: 8 (ref 5–15)
BUN: 32 mg/dL — ABNORMAL HIGH (ref 8–23)
BUN: 30 mg/dL — ABNORMAL HIGH (ref 8–23)
CO2: 22 mmol/L (ref 22–32)
CO2: 21 mmol/L — ABNORMAL LOW (ref 22–32)
Calcium: 8.1 mg/dL — ABNORMAL LOW (ref 8.9–10.3)
Calcium: 8 mg/dL — ABNORMAL LOW (ref 8.9–10.3)
Chloride: 106 mmol/L (ref 98–111)
Chloride: 104 mmol/L (ref 98–111)
Creatinine, Ser: 0.48 mg/dL — ABNORMAL LOW (ref 0.61–1.24)
Creatinine, Ser: 0.45 mg/dL — ABNORMAL LOW (ref 0.61–1.24)
GFR, Estimated: >60 mL/min (ref >60)
GFR, Estimated: >60 mL/min (ref >60)
Glucose, Bld: 91 mg/dL (ref 70–99)
Glucose, Bld: 98 mg/dL (ref 70–99)
Potassium: 4.1 mmol/L (ref 3.5–5.1)
Potassium: 3.9 mmol/L (ref 3.5–5.1)
Sodium: 136 mmol/L (ref 135–145)
Sodium: 133 mmol/L — ABNORMAL LOW (ref 135–145)

## 2023-12-02 LAB — CBC
HCT: 26 % — ABNORMAL LOW (ref 39.0–52.0)
Hemoglobin: 8 g/dL — ABNORMAL LOW (ref 13.0–17.0)
MCH: 30.5 pg (ref 26.0–34.0)
MCHC: 30.8 g/dL (ref 30.0–36.0)
MCV: 99.2 fL (ref 80.0–100.0)
Platelets: 277 K/uL (ref 150–400)
RBC: 2.62 MIL/uL — ABNORMAL LOW (ref 4.22–5.81)
RDW: 19.2 % — ABNORMAL HIGH (ref 11.5–15.5)
WBC: 6.3 K/uL (ref 4.0–10.5)
nRBC: 0 % (ref 0.0–0.2)

## 2023-12-02 LAB — HEPARIN LEVEL (UNFRACTIONATED): Heparin Unfractionated: 0.37 [IU]/mL (ref 0.30–0.70)

## 2023-12-02 LAB — MAGNESIUM
Magnesium: 2.2 mg/dL (ref 1.7–2.4)
Magnesium: 2.1 mg/dL (ref 1.7–2.4)

## 2023-12-02 LAB — PHOSPHORUS: Phosphorus: 2.8 mg/dL (ref 2.5–4.6)

## 2023-12-02 MED ORDER — TRAVASOL 10 % IV SOLN
INTRAVENOUS | Status: AC
  Filled 2023-12-02: qty 1320

## 2023-12-02 MED ORDER — METOPROLOL TARTRATE 12.5 MG HALF TABLET: 12.5000 mg | ORAL_TABLET | Freq: Two times a day (BID) | ORAL | Status: DC

## 2023-12-02 MED ORDER — METOPROLOL TARTRATE 5 MG/5ML IV SOLN
5.0000 mg | Freq: Three times a day (TID) | INTRAVENOUS | Status: AC
  Administered 2023-12-02 – 2023-12-19 (×31): 5 mg via INTRAVENOUS
  Filled 2023-12-02 (×39): qty 5

## 2023-12-02 NOTE — Progress Notes (Signed)
 PHARMACY - ANTICOAGULATION CONSULT NOTE  Pharmacy Consult for heparin  Indication: atrial fibrillation  Allergies  Allergen Reactions   Shrimp [Shellfish Allergy] Anaphylaxis and Swelling    Swelling throat    Reglan  [Metoclopramide ] Other (See Comments)    caused tardive dyskinesia    Patient Measurements: Height: 6' 4 (193 cm) Weight: 84.3 kg (185 lb 13.6 oz) IBW/kg (Calculated) : 86.8 HEPARIN  DW (KG): 94.6  Vital Signs: Temp: 97.8 F (36.6 C) (08/30 0805) Temp Source: Oral (08/30 0805) BP: 111/51 (08/30 0900) Pulse Rate: 80 (08/30 0900)  Labs: Recent Labs    11/30/23 0000 11/30/23 0500 11/30/23 0500 12/01/23 0122 12/01/23 0430 12/02/23 0500  HGB  --  8.3*   < >  --  7.8* 8.0*  HCT  --  27.6*  --   --  24.8* 26.0*  PLT  --  287  --   --  252 277  HEPARINUNFRC 0.62  --   --  0.40  --  0.37  CREATININE  --  0.55*  --   --  0.44* 0.48*   < > = values in this interval not displayed.    Estimated Creatinine Clearance: 99.5 mL/min (A) (by C-G formula based on SCr of 0.48 mg/dL (L)).   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  Patient reports he does not have symptoms as of 02/25/14.     Diarrhea 06/04/2023   Essential hypertension 06/04/2023   Fatty liver 06/04/2023   Flu 06/04/2023   Hyperlipidemia 06/04/2023   Ileus (HCC) 06/04/2023   Multiple myeloma (HCC)    Neuropathy    Paroxysmal atrial fibrillation (HCC) 07/04/2023   Pneumonia 06/04/2023     Assessment: 73 year old male with afib on Eliquis  PTA which is currently on hold. Patient recently admitted for bowel obstruction and discharged home after conservative treatment with NGT decompression. He presented back with similar concerns, is currently on TPN.   Patient is  currently on heparin  drip while apixaban  remains on hold.   Significant Events: -8/27: Several nosebleeds overnight. Op note today mentions mild NGT trauma. NG removed.  -8/28: UFH off at 06:00 for enteroscopy            UFH resumed at 17:03  Today, 12/02/23 Heparin  level = 0.37 (therapeutic) with heparin  gtt @ 1900 units/hr Hgb 8.0, plts 277--stable, improved from yesterday No s/sx of bleeding, per RN  Goal of Therapy:  Heparin  level 0.3-0.7 units/ml Monitor platelets by anticoagulation protocol: Yes   Plan:  Continue heparin  at 1900 units/hr  CBC, heparin  level daily Monitor for signs of bleeding   Lacinda Moats, PharmD Clinical Pharmacist  8/30/20259:20 AM

## 2023-12-02 NOTE — Progress Notes (Signed)
 PHARMACY - TOTAL PARENTERAL NUTRITION CONSULT NOTE   Indication: Bowel Obstruction  Patient Measurements: Height: 6' 4 (193 cm) Weight: 84.3 kg (185 lb 13.6 oz) IBW/kg (Calculated) : 86.8 TPN AdjBW (KG): 94.6 Body mass index is 22.62 kg/m. Usual Weight:   Assessment:  Pharmacy is consulted to start TPN on 73 yo male diagnosed with bowel obstruction. This admission CT abdomen shows increasing small bowel dilatation and fecalization of bowel contents involving the jejunum. No definitive transition point is identified at this time but caliber change is noted in the right mid abdomen at the junction of the jejunum and ileum. The more distal ileum is unremarkable. Some suspicion that bowel obstruction may be related to bortezomib  treatment pt receiving for multiple myeloma.   Glucose / Insulin : no hx of DM. Goal BG <150 - CBG range: 98 - 101 (no SSI used in last 24hrs) Electrolytes: All WNL, including CorrCa (9.1). Na lower end of range at 136 Renal: SCr < 1, BUN stable Hepatic: 8/28 labs: AST, Tbili WNL. ALT trending down. Alk Phos elevated, albumin  low Intake / Output; MIVF: Output decreased significantly -UOP: 900 mL, LBM 8/28 -D5 @KVO  expired 8/30 AM -Per GI, patient had large episode of emesis this AM and NG tube replaced GI Imaging: -  Admitted from 10/14/23-10/20/23 for symptoms related to small bowel obstruction (transition point suspected in the right hemi-abdomen on CT imaging) and received non-operative management.  - Admitted 11/02/23 - 11/05/2023 for small bowel dilatation/concern for small bowel obstruction. CT imaging showed persistent small bowel dilatation with mild transition point noted deep in pelvis. He went to OR 11/03/23, findings were suspicious for intermittent volvulus of small bowel to explain his symptoms and CT imaging.  - 8/10  CT abdomen shows increasing small bowel dilatation and fecalization of bowel contents involving the jejunum. No definitive transition point is  identified at this time but caliber change is noted in the right mid abdomen at the junction of the jejunum and ileum.  GI Surgeries / Procedures:  -8/28: EGD, enteroscopy - small hiatal hernia, gastritis, duodenum/jejunum appeared normal; several biopsies taken  Central access:  PICC 8/18   Nutritional Goals: Goal TPN rate is 100  mL/hr (provides 132 g of protein and 2438 kcals per day)  RD Assessment:  Estimated Needs Total Energy Estimated Needs: 2400-2600 kcals Total Protein Estimated Needs: 120-135 grams Total Fluid Estimated Needs: >/= 2.4L  Current Nutrition:  Clear liquids and TPN  Plan:  At 18:00 Continue TPN at 100 mL/hr Electrolytes in TPN:  Na 30 mEq/L (inc) K 40 mEq/L Ca 2.5 mEq/L Mg 5 mEq/L Phos 15 mmol/L  Cl:Ac max acetate Add standard MVI and trace elements to TPN Continue q8h Sensitive SSI and adjust as needed.  mIVF management per MD Monitor TPN labs on Mon/Thurs. Recheck BMP with AM labs tomorrow.    Lacinda Moats, PharmD Clinical Pharmacist  8/30/20259:23 AM

## 2023-12-02 NOTE — Progress Notes (Signed)
 Subjective: Patient had 1 episode of bilious emesis while I was in the room.  Subsequently I placed an NG tube and kept to suction, he had 1300 mL of bilious fluid drained immediately when NG tube was connected to canister.  Objective: Vital signs in last 24 hours: Temp:  [97.8 F (36.6 C)-98.1 F (36.7 C)] 97.8 F (36.6 C) (08/30 0805) Pulse Rate:  [77-87] 80 (08/30 1000) Resp:  [15-23] 17 (08/30 1000) BP: (93-137)/(36-84) 135/64 (08/30 1000) SpO2:  [98 %-99 %] 98 % (08/30 1000) Weight change:  Last BM Date : 11/30/23  PE: Ill-appearing GENERAL: Nonicteric, prominent pallor  ABDOMEN: Nondistended but bowel sounds not audible, nontender EXTREMITIES: No deformity  Lab Results: Results for orders placed or performed during the hospital encounter of 11/12/23 (from the past 48 hours)  Glucose, capillary     Status: Abnormal   Collection Time: 11/30/23  4:28 PM  Result Value Ref Range   Glucose-Capillary 112 (H) 70 - 99 mg/dL    Comment: Glucose reference range applies only to samples taken after fasting for at least 8 hours.   Comment 1 Notify RN    Comment 2 Document in Chart   Glucose, capillary     Status: Abnormal   Collection Time: 11/30/23 11:57 PM  Result Value Ref Range   Glucose-Capillary 114 (H) 70 - 99 mg/dL    Comment: Glucose reference range applies only to samples taken after fasting for at least 8 hours.  Heparin  level (unfractionated)     Status: None   Collection Time: 12/01/23  1:22 AM  Result Value Ref Range   Heparin  Unfractionated 0.40 0.30 - 0.70 IU/mL    Comment: (NOTE) The clinical reportable range upper limit is being lowered to >1.10 to align with the FDA approved guidance for the current laboratory assay.  If heparin  results are below expected values, and patient dosage has  been confirmed, suggest follow up testing of antithrombin III levels. Performed at [facility], [address] [place], [place] [address]   CBC      Status: Abnormal   Collection Time: 12/01/23  4:30 AM  Result Value Ref Range   WBC 5.9 4.0 - 10.5 K/uL   RBC 2.48 (L) 4.22 - 5.81 MIL/uL   Hemoglobin 7.8 (L) 13.0 - 17.0 g/dL   HCT 75.1 (L) 60.9 - 47.9 %   MCV 100.0 80.0 - 100.0 fL   MCH 31.5 26.0 - 34.0 pg   MCHC 31.5 30.0 - 36.0 g/dL   RDW 80.8 (H) 88.4 - 84.4 %   Platelets 252 150 - 400 K/uL   nRBC 0.0 0.0 - 0.2 %    Comment: Performed at [facility], [address], [place], [place] [address]  Basic metabolic panel with GFR     Status: Abnormal   Collection Time: 12/01/23  4:30 AM  Result Value Ref Range   Sodium 140 135 - 145 mmol/L   Potassium 4.1 3.5 - 5.1 mmol/L   Chloride 109 98 - 111 mmol/L   CO2 23 22 - 32 mmol/L   Glucose, Bld 113 (H) 70 - 99 mg/dL    Comment: Glucose reference range applies only to samples taken after fasting for at least 8 hours.   BUN 29 (H) 8 - 23 mg/dL   Creatinine, Ser 9.55 (L) 0.61 - 1.24 mg/dL   Calcium  7.9 (L) 8.9 - 10.3 mg/dL   GFR, Estimated >39 >39 mL/min    Comment: (NOTE) Calculated using  the CKD-EPI Creatinine Equation (2021)    Anion gap 7 5 - 15    Comment: Performed at [facility] [address] [place], [place] [address]  Magnesium      Status: None   Collection Time: 12/01/23  4:30 AM  Result Value Ref Range   Magnesium  2.2 1.7 - 2.4 mg/dL    Comment: Performed at [facility], [address] [place], [place] [address]  Phosphorus     Status: None   Collection Time: 12/01/23  4:30 AM  Result Value Ref Range   Phosphorus 2.9 2.5 - 4.6 mg/dL    Comment: Performed at [facility], [address], [place], [place] [address]  Glucose, capillary     Status: Abnormal   Collection Time: 12/01/23  5:47 AM  Result Value Ref Range   Glucose-Capillary 110 (H) 70 - 99 mg/dL    Comment: Glucose reference range applies only to samples taken after fasting for at least 8 hours.  Glucose, capillary     Status:  None   Collection Time: 12/01/23  4:00 PM  Result Value Ref Range   Glucose-Capillary 98 70 - 99 mg/dL    Comment: Glucose reference range applies only to samples taken after fasting for at least 8 hours.   Comment 1 Notify RN    Comment 2 Document in Chart   Glucose, capillary     Status: Abnormal   Collection Time: 12/02/23  1:12 AM  Result Value Ref Range   Glucose-Capillary 100 (H) 70 - 99 mg/dL    Comment: Glucose reference range applies only to samples taken after fasting for at least 8 hours.  CBC     Status: Abnormal   Collection Time: 12/02/23  5:00 AM  Result Value Ref Range   WBC 6.3 4.0 - 10.5 K/uL   RBC 2.62 (L) 4.22 - 5.81 MIL/uL   Hemoglobin 8.0 (L) 13.0 - 17.0 g/dL   HCT 73.9 (L) 60.9 - 47.9 %   MCV 99.2 80.0 - 100.0 fL   MCH 30.5 26.0 - 34.0 pg   MCHC 30.8 30.0 - 36.0 g/dL   RDW 80.7 (H) 88.4 - 84.4 %   Platelets 277 150 - 400 K/uL   nRBC 0.0 0.0 - 0.2 %    Comment: Performed at [facility], [address], [place], [place] [address]  Basic metabolic panel with GFR     Status: Abnormal   Collection Time: 12/02/23  5:00 AM  Result Value Ref Range   Sodium 136 135 - 145 mmol/L   Potassium 4.1 3.5 - 5.1 mmol/L   Chloride 106 98 - 111 mmol/L   CO2 22 22 - 32 mmol/L   Glucose, Bld 91 70 - 99 mg/dL    Comment: Glucose reference range applies only to samples taken after fasting for at least 8 hours.   BUN 32 (H) 8 - 23 mg/dL   Creatinine, Ser 9.51 (L) 0.61 - 1.24 mg/dL   Calcium  8.1 (L) 8.9 - 10.3 mg/dL   GFR, Estimated >39 >39 mL/min    Comment: (NOTE) Calculated using the CKD-EPI Creatinine Equation (2021)    Anion gap 9 5 - 15    Comment: Performed at [facility], [address] [place], [place] [address]  Magnesium      Status: None   Collection Time: 12/02/23  5:00 AM  Result Value Ref Range   Magnesium  2.2 1.7 - 2.4 mg/dL  Comment: Performed at [facility], [address] [place],  [place] [address]  Phosphorus     Status: None   Collection Time: 12/02/23  5:00 AM  Result Value Ref Range   Phosphorus 2.8 2.5 - 4.6 mg/dL    Comment: Performed at [facility], [address], [place], [place] [address]  Heparin  level (unfractionated)     Status: None   Collection Time: 12/02/23  5:00 AM  Result Value Ref Range   Heparin  Unfractionated 0.37 0.30 - 0.70 IU/mL    Comment: (NOTE) The clinical reportable range upper limit is being lowered to >1.10 to align with the FDA approved guidance for the current laboratory assay.  If heparin  results are below expected values, and patient dosage has  been confirmed, suggest follow up testing of antithrombin III levels. Performed at [facility], [address] [place], [place] [address]   Glucose, capillary     Status: Abnormal   Collection Time: 12/02/23  8:04 AM  Result Value Ref Range   Glucose-Capillary 101 (H) 70 - 99 mg/dL    Comment: Glucose reference range applies only to samples taken after fasting for at least 8 hours.   Comment 1 Notify RN    Comment 2 Document in Chart     Studies/Results: DG Abd Portable 1V Result Date: 12/02/2023 EXAM: 1 VIEW XRAY OF THE ABDOMEN 12/02/2023 08:28:00 AM COMPARISON: Previous exam. CLINICAL HISTORY: 01250 Ileus (HCC) [id number]. ileus 98749 Ileus (HCC) [id number]. ileus FINDINGS: BOWEL: Multiple gas-dilated mid abdominal small bowel loops overall relatively stable in number of involved loops and degree of dilatation. The colon is decompressed with some oral contrast distally. SOFT TISSUES: No opaque urinary calculi. BONES: Bilateral hip DJD. No acute osseous abnormality. IMPRESSION: 1. Multiple gas-dilated mid abdominal small bowel loops, relatively stable in number and degree of dilatation, consistent with ileus. 2. Decompressed colon with some oral contrast distally. Electronically signed by: Dayne Hassell MD 12/02/2023 10:18 AM EDT RP Workstation: [id number]   DG  Abd 1 View Result Date: 12/01/2023 CLINICAL DATA:  Diarrhea and ileus. EXAM: ABDOMEN - 1 VIEW COMPARISON:  Multiple recent studies with the most recent dated 11/27/2023 FINDINGS: Relatively stable dilated bowel, predominantly small bowel. The most dilated small bowel in the right abdomen shows slightly increased dilatation measuring up to 5.8 cm compared to 5.4 cm on the prior study. The colon appears relatively decompressed. Findings again are consistent with partial small bowel obstruction versus ileus. No signs of free air. IMPRESSION: Slight increase in dilatation of small bowel in the right abdomen. Findings again are consistent with partial small bowel obstruction versus ileus. Electronically Signed   By: Marcey Moan M.D.   On: 12/01/2023 10:43    Medications: I have reviewed the patient's current medications.  Assessment: Ileus After patient had 1 episode of emesis, I placed NG tube at bedside and immediately got 1300 mL of bilious return in the canister  Hemoglobin 8, MCV 99.2 BUN 32, creatinine 0.38, GFR more than 60  Plan: Biopsies from stomach and small bowel pending for evaluation for amyloid Patient continues to have ileus. He will need to continue NG tube to intermittent suction, kept n.p.o. and continued on IV TPN. Abdominal x-ray has been ordered to check for NG tube placement. Will monitor NG tube return and recommend abdominal x-ray in a.m.  Estelita Manas, MD 12/02/2023, 11:40 AM

## 2023-12-02 NOTE — Progress Notes (Signed)
 PROGRESS NOTE    David Mcgrath  FMW:[id number] DOB: Jan 25, 1951 DOA: 11/12/2023 PCP: [name], DO    Chief Complaint  Patient presents with   Weakness   Dizziness   Diarrhea         Brief Narrative:  73 y.o. male with medical history significant for hypertension, hyperlipidemia, paroxysmal A-fib on Eliquis , chronic anxiety/depression, polyneuropathy, multiple myeloma (diagnosed March 2025) received Bortezomib  infusion 11/09/2023. Patient was admitted here 11/02/2023 through 11/05/2023 with intractable nausea vomiting, recurrent small bowel obstruction.  He was treated conservatively with NG tube decompression and discharged home.     Last admission CT abdomen/pelvis with contrast with slight improvement in small bowel dilation when compared to prior exam but with mild transition point deep in the pelvis, no pancreatic ductal dilation, no inflammatory changes, stable free fluid within the abdomen.  General surgery was consulted and patient underwent diagnostic laparoscopy by Dr. [name] on 11/03/2023 findings of intermittently dilated small bowel with erythema of the serosa consistent with inflammation/edema, intermittent injection of the small bowel mesentery, small bowel with tendency to migrate to the left upper quadrant with long/mobile small bowel mesentery with suspicion/potential for intermittent volvulized small bowel contributing to his symptoms and findings on imaging.  Diet was slowly advanced with toleration.     This admission CT abdomen shows increasing small bowel dilatation and fecalization of bowel contents involving the jejunum. No definitive transition point is identified at this time but caliber change is noted in the right mid abdomen at the junction of the jejunum and ileum. The more distal ileum is unremarkable. Stable ground-glass opacities in the lung bases similar to that seen on prior exams.    **Interim History Has had a persistent ileus and continues  to  have gastric distention. Unfortunately lost his NG tube on 8/21 but had to be replaced by IR.  Now connected back to [facility].  Underwent a upper GI series with small bowel follow-through and this shows contrast in the stomach with delayed gastric emptying.  Subsequently there was contrast in the rectum suggesting no complete obstruction.  GI recommending continuing erythromycin  for now and repeating abdominal x-rays and considering EGD early next week (Monday vs Tuesday) to rule out pyloric stricture and recommending ambulation.   8/25: Patient was admitted on 8/10 and I assumed care today on 8/25.  Please review prior notes.  Most of the stuff carried on for continued of care.  Further management as below.   Assessment & Plan:   Principal Problem:   Ileus (HCC) Active Problems:   Hypokalemia   Paroxysmal atrial fibrillation (HCC)   Multiple myeloma (HCC)   Essential hypertension   Depression   Hyperlipidemia   Pressure injury of skin   Protein-calorie malnutrition, severe   Hypernatremia   Swelling of lower extremity   Transaminitis   Acute urinary retention  #1 ileus versus partial small bowel obstruction -Patient noted to have presented with nausea vomiting diarrhea. - CT abdomen and pelvis done concerning ileus versus partial SBO. - Patient noted to have been treated conservatively. - NG tube not initially placed. - Patient initially was improving around the hospitalization and had further recurrence of nausea and vomiting. - Repeat CT abdomen and pelvis done with persistent small bowel dilatation with prominent jejunum. - Patient started a trial with erythromycin  on 11/18/2023 but was not successful as patient noted to began having large biliary emesis on 11/18/2023. - Due to allergy to Reglan  patient was placed on IV erythromycin  and dose  increased to 500 mg 3 times daily. - NG tube initially placed 11/18/2023 and subsequently had to be replaced 11/23/2023. - Patient seen in  consultation by general surgery and did not feel patient needed any repeat laparoscopy as deferred would not be beneficial. - Due to patient's prolonged hospitalization minimal nutrition patient started on TPN after PICC line was placed on 11/20/2023. - Patient subsequently underwent UGI with SBFT per surgery to assess for transit and patency which showed contrast present in the stomach with delayed gastric emptying. - Narcotics are to be minimized. - Patient seen in consultation by GI review might take several days to weeks for issue to resolve. - KUB done 11/24/2023 showed minimal bowel contrast material demonstrated.  There appears to be contrast material in the rectum suggesting no evidence of complete obstruction.  Gaseous distention of small bowel.  Gas-filled colon most likely ileus. - Repeat abdominal films done with similar diffuse gaseous distention of the bowel and abdomen and pelvis compared to prior films. - GI reconsulted and following the patient status post small bowel enteroscopy/EGD 11/30/2023 with small hiatal hernia, caustic gastritis with biopsies taken.  - NG tube discontinued postprocedure and patient with no nausea or emesis no abdominal pain.  -Abdominal x-ray from 12/01/2023 consistent with ileus versus partial SBO.   - Repeat abdominal films this morning.   - Patient started on clears on 12/01/2023 per GI which she seems to be tolerating.   - Passing flatus, no documented BM. - Patient was on erythromycin  and noted to unable to tolerate Reglan .  - Continue mobilization to stimulate gastric intestinal motility with narcotic avoidance.  - Continue IV PPI.  - Per GI.    2.  Hypotension - Resolved with hydration. - BP soft/borderline.  3.  Lactic acidosis -Resolved.  4.  Leukocytosis -Likely reactive, patient noted to have large GI losses on 8/16 and subsequently became hypotensive with elevated lactic acid levels. - Patient placed on IV fluids with a bolus and  maintenance fluids with normalization of lactic acid on 11/19/2023. - Low suspicion for infection. - Status post IV fluids. - Continue to hold antibiotics at this time as no further leukocytosis, patient afebrile - Patient noted to have some orthostatic hypotension on 11/28/2023 while working with PT/OT and advised Ace wrap/TED hose while working with PT.  5.  Diarrhea -Patient noted to have reported semiformed stools. - GI pathogen panel negative. - Patient likely colonized with C. difficile as testing was negative for any active infection (antigen positive, toxin negative, PCR negative). - Follow.  6.  Acute urinary retention -Foley catheter placed 11/13/2023. - Voiding trial today.   7.  History of multiple myeloma -Per Dr. Von he was found somewhat patient symptoms thought to be due to chemotherapeutic medications and oncology reengaged to see if they had any further recommendations.   -Outpatient follow-up with oncology.  8.  Paroxysmal A-fib -Not on any rate limiting medications. -Currently in normal sinus rhythm and rate controlled. - Patient noted to be on metoprolol  at home but on a as needed basis. - Was on Eliquis  which currently on hold as patient underwent EGD/small bowel enteroscopy, 11/30/2023. -Heparin  drip held preprocedure and resumed postprocedure.  9.  Left finger discoloration -Resolved. - Noted to have had some discoloration of the left fingers on 8/18 however wife stated that was present on 8/17 and actually had improved on 8/18 and subsequently 8/20. - Concern was for cyanosis of his fingertips as they were also cold. - Patient noted  to to have PICC line placed in the left upper extremity 8/18 however wife stated discoloration was present prior to that; there was also some extravasation from prior IV in the left arm as well but unclear what was transfusing as it happened prior to transfer to the ICU. - Left upper extremity arterial Dopplers were done with no  obstruction of the left upper extremity with triphasic waves. - Patient seen in consultation by vascular surgery who felt no surgical intervention needed at this time. - Resolution of finger discoloration 8/19.  10.  Normocytic anemia -Patient with no significant overt bleeding. - Hemoglobin stable at 8.0. - Follow H&H. - Transfusion threshold hemoglobin < 8.  11.  Hypernatremia -Likely secondary to dehydration. -Resolved with D5W. - Patient currently on TPN. -Discontinue D5W.  12.  Hypokalemia/hypomagnesemia/hyponatremia -Hyponatremia resolved patient was hypernatremic, which resolved with D5W.  - Patient on TPN. - Potassium now at 4.1.  Phosphorus at 2.8, magnesium  at 2.2. - Repeat labs in AM.  13.  Abnormal LFTs -Acute hepatitis panel negative. - LFTs trending back down. - Follow.  14.  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 - Status post IV Lasix  x 1. - IVF saline lock.  15.  Overweight -BMI of 22.43 kg/m. - Lifestyle modification - Outpatient follow-up with PCP.  16.  Severe protein calorie malnutrition - Patient started on trial of clears, 12/01/2023.   - Continue TPN per pharmacy.   DVT prophylaxis: Heparin  GTT Code Status: Full Family Communication: Updated patient.  No family at bedside.  Disposition: TBD  Status is: Inpatient Remains inpatient appropriate because: Severity of of illness   Consultants:  Gastroenterology: Dr. Kriss 11/19/2023 Vascular surgery: Dr.Robins 11/20/2023 General Surgery Interventional radiology  Procedures:  CT abdomen and pelvis 11/13/2023, 11/16/2023 Upper extremity arterial duplex, left 11/21/2023 Bilateral lower extremity Dopplers 11/26/2023 SBO protocol  11/17/2023 PICC line Small bowel enteroscopy/EGD: Per GI: Dr. Rosalie 11/30/2023  Antimicrobials:  Anti-infectives (From admission, onward)    Start     Dose/Rate Route Frequency Ordered Stop   11/27/23 1200  erythromycin  500 mg in sodium chloride  0.9 % 100 mL IVPB        500 mg 100 mL/hr over 60 Minutes Intravenous Every 6 hours 11/27/23 0933 11/29/23 0723   11/24/23 1400  erythromycin  500 mg in sodium chloride  0.9 % 100 mL IVPB  Status:  Discontinued        500 mg 100 mL/hr over 60 Minutes Intravenous Every 8 hours 11/24/23 1027 11/27/23 0933   11/23/23 2000  erythromycin  250 mg in sodium chloride  0.9 % 100 mL IVPB  Status:  Discontinued        250 mg 100 mL/hr over 60 Minutes Intravenous Every 8 hours 11/23/23 1744 11/24/23 1027   11/23/23 1200  erythromycin  (E-MYCIN ) tablet 250 mg  Status:  Discontinued        250 mg Oral 3 times daily before meals 11/23/23 1004 11/23/23 1744   11/18/23 1200  erythromycin  (E-MYCIN ) tablet 250 mg  Status:  Discontinued        250 mg Oral 2 times daily with meals 11/18/23 0720 11/19/23 0734   11/13/23 1000  acyclovir  (ZOVIRAX ) tablet 400 mg  Status:  Discontinued        400 mg Oral 2 times daily 11/13/23  0503 11/19/23 0739         Subjective: Patient sleeping but easily arousable.  Denies any chest pain or shortness of breath.  Denies any abdominal pain.  Tolerating clear liquids.  Passing some flatus.  Patient feels he may have had a small BM last night however no BM recorded.    Objective: Vitals:   12/02/23 0500 12/02/23 0600 12/02/23 0805 12/02/23 0900  BP: (!) 97/41 124/61 (!) 106/41 (!) 111/51  Pulse: 82 86 80 80  Resp: (!) 23 (!) 21 17 16   Temp:   97.8 F (36.6 C)   TempSrc:   Oral   SpO2: 99% 98% 98% 98%  Weight:      Height:        Intake/Output Summary (Last 24 hours) at 12/02/2023 0927 Last data filed at 12/02/2023 0900 Gross per 24 hour  Intake 3286.56 ml  Output 200 ml  Net 3086.56 ml   Filed Weights   11/27/23 0500  11/28/23 0500 12/01/23 0436  Weight: 85.1 kg 81.4 kg 84.3 kg    Examination:  General exam: NAD.  Respiratory system: Bibasilar crackles.  No wheezing.  No rhonchi.  Fair air movement.  No use of accessory muscles of respiration.  Speaking in full sentences.   Cardiovascular system: RRR no murmurs rubs or gallops.  No JVD.  Trace to 1+ bilateral lower extremity edema.  Gastrointestinal system: Abdomen is soft, nontender, nondistended, positive bowel sounds.  No rebound.  No guarding.   Central nervous system: Alert and oriented. No focal neurological deficits. Extremities: Symmetric 5 x 5 power. Skin: No rashes, lesions or ulcers Psychiatry: Judgement and insight appear normal. Mood & affect appropriate.     Data Reviewed: I have personally reviewed following labs and imaging studies  CBC: Recent Labs  Lab 11/26/23 0440 11/27/23 0510 11/29/23 0643 11/30/23 0500 12/01/23 0430 12/02/23 0500  WBC 5.9 5.9 6.9 6.9 5.9 6.3  NEUTROABS 4.3 4.3  --   --   --   --   HGB 8.5* 8.5* 8.1* 8.3* 7.8* 8.0*  HCT 26.1* 27.0* 27.4* 27.6* 24.8* 26.0*  MCV 94.6 96.1 100.7* 101.5* 100.0 99.2  PLT 204 249 279 287 252 277    Basic Metabolic Panel: Recent Labs  Lab 11/27/23 0510 11/28/23 0504 11/29/23 0643 11/30/23 0500 12/01/23 0430 12/02/23 0500  NA 143 147* 149* 144 140 136  K 3.3* 3.5 4.2 4.1 4.1 4.1  CL 101 104 114* 112* 109 106  CO2 36* 35* 27 23 23 22   GLUCOSE 132* 129* 122* 120* 113* 91  BUN 26* 28* 33* 33* 29* 32*  CREATININE 0.53* 0.58* 0.56* 0.55* 0.44* 0.48*  CALCIUM  8.2* 8.4* 8.5* 8.1* 7.9* 8.1*  MG 2.3  --  2.5* 2.3 2.2 2.2  PHOS 4.6  --  3.5 3.4 2.9 2.8    GFR: Estimated Creatinine Clearance: 99.5 mL/min (A) (by C-G formula based on SCr of 0.48 mg/dL (L)).  Liver Function Tests: Recent Labs  Lab 11/26/23 0440 11/27/23 0510 11/30/23 0500  AST 73* 50* 36  ALT 114* 94* 76*  ALKPHOS 80 94 142*  BILITOT 0.4 0.4 0.5  PROT 5.1* 5.4* 5.5*  ALBUMIN  1.8* 2.0* 2.5*     CBG: Recent Labs  Lab 11/30/23 2357 12/01/23 0547 12/01/23 1600 12/02/23 0112 12/02/23 0804  GLUCAP 114* 110* 98 100* 101*     No results found for this or any previous visit (from the past 240 hours).       Radiology Studies: DG  Abd 1 View Result Date: 12/01/2023 CLINICAL DATA:  Diarrhea and ileus. EXAM: ABDOMEN - 1 VIEW COMPARISON:  Multiple recent studies with the most recent dated 11/27/2023 FINDINGS: Relatively stable dilated bowel, predominantly small bowel. The most dilated small bowel in the right abdomen shows slightly increased dilatation measuring up to 5.8 cm compared to 5.4 cm on the prior study. The colon appears relatively decompressed. Findings again are consistent with partial small bowel obstruction versus ileus. No signs of free air. IMPRESSION: Slight increase in dilatation of small bowel in the right abdomen. Findings again are consistent with partial small bowel obstruction versus ileus. Electronically Signed   By: Marcey Moan M.D.   On: 12/01/2023 10:43        Scheduled Meds:  butamben -tetracaine -benzocaine   1 spray Topical Once   Chlorhexidine  Gluconate Cloth  6 each Topical Q2200   fluticasone   1 spray Each Nare Daily   insulin  aspart  0-9 Units Subcutaneous Q8H   oxymetazoline   1 spray Each Nare BID   pantoprazole  (PROTONIX ) IV  40 mg Intravenous Q12H   sodium chloride  flush  10-40 mL Intracatheter Q12H   Continuous Infusions:  dextrose  10 mL/hr at 11/30/23 0825   heparin  1,900 Units/hr (12/02/23 0900)   TPN ADULT (ION) 100 mL/hr at 12/02/23 0900     LOS: 19 days    Time spent: 40 minutes    Toribio Hummer, MD [facility]   To contact the attending provider between 7A-7P or the covering provider during after hours 7P-7A, please log into the web site [email or web address] and access using universal  password for that web site. If you do not have the password, please call the hospital operator.  12/02/2023, 9:27 AM

## 2023-12-03 ENCOUNTER — Inpatient Hospital Stay (HOSPITAL_COMMUNITY)

## 2023-12-03 DIAGNOSIS — C9000 Multiple myeloma not having achieved remission: Secondary | ICD-10-CM | POA: Diagnosis not present

## 2023-12-03 DIAGNOSIS — E876 Hypokalemia: Secondary | ICD-10-CM | POA: Diagnosis not present

## 2023-12-03 DIAGNOSIS — Z4682 Encounter for fitting and adjustment of non-vascular catheter: Secondary | ICD-10-CM | POA: Diagnosis not present

## 2023-12-03 DIAGNOSIS — E785 Hyperlipidemia, unspecified: Secondary | ICD-10-CM | POA: Diagnosis not present

## 2023-12-03 DIAGNOSIS — I700 Atherosclerosis of aorta: Secondary | ICD-10-CM | POA: Diagnosis not present

## 2023-12-03 DIAGNOSIS — K567 Ileus, unspecified: Secondary | ICD-10-CM | POA: Diagnosis not present

## 2023-12-03 DIAGNOSIS — I4729 Other ventricular tachycardia: Secondary | ICD-10-CM | POA: Insufficient documentation

## 2023-12-03 LAB — CBC
HCT: 26.6 % — ABNORMAL LOW (ref 39.0–52.0)
Hemoglobin: 8 g/dL — ABNORMAL LOW (ref 13.0–17.0)
MCH: 36 pg — ABNORMAL HIGH (ref 26.0–34.0)
MCHC: 30.1 g/dL (ref 30.0–36.0)
MCV: 119.8 fL — ABNORMAL HIGH (ref 80.0–100.0)
Platelets: 240 K/uL (ref 150–400)
RBC: 2.22 MIL/uL — ABNORMAL LOW (ref 4.22–5.81)
RDW: 20.6 % — ABNORMAL HIGH (ref 11.5–15.5)
WBC: 6.4 K/uL (ref 4.0–10.5)
nRBC: 0 % (ref 0.0–0.2)

## 2023-12-03 LAB — GLUCOSE, CAPILLARY
Glucose-Capillary: 110 mg/dL — ABNORMAL HIGH (ref 70–99)
Glucose-Capillary: 94 mg/dL (ref 70–99)
Glucose-Capillary: 98 mg/dL (ref 70–99)

## 2023-12-03 LAB — BASIC METABOLIC PANEL WITH GFR
Anion gap: 8 (ref 5–15)
BUN: 25 mg/dL — ABNORMAL HIGH (ref 8–23)
CO2: 23 mmol/L (ref 22–32)
Calcium: 8 mg/dL — ABNORMAL LOW (ref 8.9–10.3)
Chloride: 104 mmol/L (ref 98–111)
Creatinine, Ser: 0.48 mg/dL — ABNORMAL LOW (ref 0.61–1.24)
GFR, Estimated: >60 mL/min (ref >60)
Glucose, Bld: 111 mg/dL — ABNORMAL HIGH (ref 70–99)
Potassium: 4.1 mmol/L (ref 3.5–5.1)
Sodium: 134 mmol/L — ABNORMAL LOW (ref 135–145)

## 2023-12-03 LAB — HEPARIN LEVEL (UNFRACTIONATED)
Heparin Unfractionated: 0.19 [IU]/mL — ABNORMAL LOW (ref 0.30–0.70)
Heparin Unfractionated: 0.19 [IU]/mL — ABNORMAL LOW (ref 0.30–0.70)

## 2023-12-03 LAB — MAGNESIUM: Magnesium: 2 mg/dL (ref 1.7–2.4)

## 2023-12-03 MED ORDER — PANTOPRAZOLE SODIUM 40 MG IV SOLR
40.0000 mg | INTRAVENOUS | Status: DC
  Administered 2023-12-04: 40 mg via INTRAVENOUS
  Filled 2023-12-03: qty 10

## 2023-12-03 MED ORDER — STERILE WATER FOR INJECTION IJ SOLN
INTRAMUSCULAR | Status: AC
  Administered 2023-12-03: 10 mL
  Filled 2023-12-03: qty 10

## 2023-12-03 MED ORDER — TRAVASOL 10 % IV SOLN
INTRAVENOUS | Status: AC
  Filled 2023-12-03: qty 1320

## 2023-12-03 NOTE — Progress Notes (Signed)
 New bright red blood noted in NGT line. Patient deny nausea, abdominal pain, abdomen is soft and non-distended, active bowel sounds in all quadrants. Patient states that his nose and throat are sore from the NGT being advanced on day shift. [facility] night provider notified and awaiting response. NGT is clamped until provider response. Pharmacy notified on the noted blood as well. Collect heparin  level at 2330.

## 2023-12-03 NOTE — Progress Notes (Signed)
 Subjective: Reports feeling more comfortable today. Had a large liquid bowel movement today morning.  Objective: Vital signs in last 24 hours: Temp:  [97.7 F (36.5 C)-99.1 F (37.3 C)] 98.1 F (36.7 C) (08/31 1200) Pulse Rate:  [70-98] 85 (08/31 1100) Resp:  [14-30] 18 (08/31 1100) BP: (107-153)/(45-83) 130/77 (08/31 1100) SpO2:  [95 %-100 %] 95 % (08/31 1100) Weight change:  Last BM Date : 11/30/23  PE: Not in distress GENERAL: NG draining bilious fluid  ABDOMEN: Nontender, nondistended, bowel sounds audible EXTREMITIES: No deformity  Lab Results: Results for orders placed or performed during the hospital encounter of 11/12/23 (from the past 48 hours)  Glucose, capillary     Status: None   Collection Time: 12/01/23  4:00 PM  Result Value Ref Range   Glucose-Capillary 98 70 - 99 mg/dL    Comment: Glucose reference range applies only to samples taken after fasting for at least 8 hours.   Comment 1 Notify RN    Comment 2 Document in Chart   Glucose, capillary     Status: Abnormal   Collection Time: 12/02/23  1:12 AM  Result Value Ref Range   Glucose-Capillary 100 (H) 70 - 99 mg/dL    Comment: Glucose reference range applies only to samples taken after fasting for at least 8 hours.  CBC     Status: Abnormal   Collection Time: 12/02/23  5:00 AM  Result Value Ref Range   WBC 6.3 4.0 - 10.5 K/uL   RBC 2.62 (L) 4.22 - 5.81 MIL/uL   Hemoglobin 8.0 (L) 13.0 - 17.0 g/dL   HCT 73.9 (L) 60.9 - 47.9 %   MCV 99.2 80.0 - 100.0 fL   MCH 30.5 26.0 - 34.0 pg   MCHC 30.8 30.0 - 36.0 g/dL   RDW 80.7 (H) 88.4 - 84.4 %   Platelets 277 150 - 400 K/uL   nRBC 0.0 0.0 - 0.2 %    Comment: Performed at [facility], [address], [place], [place] [address]  Basic metabolic panel with GFR     Status: Abnormal   Collection Time: 12/02/23  5:00 AM  Result Value Ref Range   Sodium 136 135 - 145 mmol/L   Potassium 4.1 3.5 - 5.1 mmol/L   Chloride 106 98 - 111 mmol/L   CO2  22 22 - 32 mmol/L   Glucose, Bld 91 70 - 99 mg/dL    Comment: Glucose reference range applies only to samples taken after fasting for at least 8 hours.   BUN 32 (H) 8 - 23 mg/dL   Creatinine, Ser 9.51 (L) 0.61 - 1.24 mg/dL   Calcium  8.1 (L) 8.9 - 10.3 mg/dL   GFR, Estimated >39 >39 mL/min    Comment: (NOTE) Calculated using the CKD-EPI Creatinine Equation (2021)    Anion gap 9 5 - 15    Comment: Performed at [facility], [address], [place], [place] [address]  Magnesium      Status: None   Collection Time: 12/02/23  5:00 AM  Result Value Ref Range   Magnesium  2.2 1.7 - 2.4 mg/dL    Comment: Performed at [facility], [address] [place], [place] [address]  Phosphorus     Status: None   Collection Time: 12/02/23  5:00 AM  Result Value Ref Range   Phosphorus 2.8 2.5 - 4.6 mg/dL    Comment: Performed at [facility], [address], [place], [place] [address]  Heparin  level (unfractionated)     Status: None   Collection Time: 12/02/23  5:00 AM  Result Value Ref Range   Heparin  Unfractionated 0.37 0.30 - 0.70 IU/mL    Comment: (NOTE) The clinical reportable range upper limit is being lowered to >1.10 to align with the FDA approved guidance for the current laboratory assay.  If heparin  results are below expected values, and patient dosage has  been confirmed, suggest follow up testing of antithrombin III levels. Performed at [facility], [address], [place], [place] [address]   Glucose, capillary     Status: Abnormal   Collection Time: 12/02/23  8:04 AM  Result Value Ref Range   Glucose-Capillary 101 (H) 70 - 99 mg/dL    Comment: Glucose reference range applies only to samples taken after fasting for at least 8 hours.   Comment 1 Notify RN    Comment 2 Document in Chart   Glucose, capillary     Status: None   Collection Time: 12/02/23  3:43 PM  Result Value Ref Range   Glucose-Capillary  89 70 - 99 mg/dL    Comment: Glucose reference range applies only to samples taken after fasting for at least 8 hours.   Comment 1 Document in Chart   Basic metabolic panel     Status: Abnormal   Collection Time: 12/02/23  6:17 PM  Result Value Ref Range   Sodium 133 (L) 135 - 145 mmol/L   Potassium 3.9 3.5 - 5.1 mmol/L   Chloride 104 98 - 111 mmol/L   CO2 21 (L) 22 - 32 mmol/L   Glucose, Bld 98 70 - 99 mg/dL    Comment: Glucose reference range applies only to samples taken after fasting for at least 8 hours.   BUN 30 (H) 8 - 23 mg/dL   Creatinine, Ser 9.54 (L) 0.61 - 1.24 mg/dL   Calcium  8.0 (L) 8.9 - 10.3 mg/dL   GFR, Estimated >39 >39 mL/min    Comment: (NOTE) Calculated using the CKD-EPI Creatinine Equation (2021)    Anion gap 8 5 - 15    Comment: Performed at [facility], [address], [place], [place] [address]  Magnesium      Status: None   Collection Time: 12/02/23  6:17 PM  Result Value Ref Range   Magnesium  2.1 1.7 - 2.4 mg/dL    Comment: Performed at [facility], [address] [place], [place] [address]  Glucose, capillary     Status: None   Collection Time: 12/02/23 11:23 PM  Result Value Ref Range   Glucose-Capillary 94 70 - 99 mg/dL    Comment: Glucose reference range applies only to samples taken after fasting for at least 8 hours.  CBC     Status: Abnormal   Collection Time: 12/03/23  6:04 AM  Result Value Ref Range   WBC 6.4 4.0 - 10.5 K/uL   RBC 2.22 (L) 4.22 - 5.81 MIL/uL   Hemoglobin 8.0 (L) 13.0 - 17.0 g/dL   HCT 73.3 (L) 60.9 - 47.9 %   MCV 119.8 (H) 80.0 - 100.0 fL    Comment: DELTA CHECK NOTED REPEATED TO VERIFY    MCH 36.0 (H) 26.0 - 34.0 pg   MCHC 30.1 30.0 - 36.0 g/dL   RDW 79.3 (H) 88.4 - 84.4 %   Platelets 240 150 - 400 K/uL   nRBC 0.0 0.0 - 0.2 %    Comment: Performed at [facility], [address] [place], [place] [address]  Heparin  level (unfractionated)     Status: Abnormal    Collection Time: 12/03/23  6:04 AM  Result Value Ref Range   Heparin  Unfractionated 0.19 (L) 0.30 - 0.70 IU/mL    Comment: (NOTE) The clinical reportable range upper limit is being lowered to >1.10 to align with the FDA approved guidance for the current laboratory assay.  If heparin  results are below expected values, and patient dosage has  been confirmed, suggest follow up testing of antithrombin III levels. Performed at [facility], [address] [place], [place] [address]   Glucose, capillary     Status: Abnormal   Collection Time: 12/03/23  7:55 AM  Result Value Ref Range   Glucose-Capillary 110 (H) 70 - 99 mg/dL    Comment: Glucose reference range applies only to samples taken after fasting for at least 8 hours.  Basic metabolic panel with GFR     Status: Abnormal   Collection Time: 12/03/23  9:11 AM  Result Value Ref Range   Sodium 134 (L) 135 - 145 mmol/L   Potassium 4.1 3.5 - 5.1 mmol/L   Chloride 104 98 - 111 mmol/L   CO2 23 22 - 32 mmol/L   Glucose, Bld 111 (H) 70 - 99 mg/dL    Comment: Glucose reference range applies only to samples taken after fasting for at least 8 hours.   BUN 25 (H) 8 - 23 mg/dL   Creatinine, Ser 9.51 (L) 0.61 - 1.24 mg/dL   Calcium  8.0 (L) 8.9 - 10.3 mg/dL   GFR, Estimated >39 >39 mL/min    Comment: (NOTE) Calculated using the CKD-EPI Creatinine Equation (2021)    Anion gap 8 5 - 15    Comment: Performed at [facility], [address] [place], [place] [address]  Magnesium      Status: None   Collection Time: 12/03/23  9:11 AM  Result Value Ref Range   Magnesium  2.0 1.7 - 2.4 mg/dL    Comment: Performed at [facility], [address] [place], [place] [address]    Studies/Results: DG Abd Portable 1V Result Date: 12/03/2023 EXAM: 1 VIEW XRAY OF THE ABDOMEN 12/03/2023 06:14:12 AM COMPARISON: Previous exam. CLINICAL HISTORY: 98749 Ileus (HCC) 98749. ileus FINDINGS: BOWEL: Gastric  Tube extends to the decompressed stomach. Several gas dilated mid abdominal small bowel loops. Colon appears decompressed. Fecalith in the rectum. SOFT TISSUES: No opaque urinary calculi. BONES: Mild bilateral Hip DJD. No acute osseous abnormality. VASCULATURE: Femoral arterial calcifications. Aortic atherosclerosis (ICD10-I70.0). IMPRESSION: 1. Ileus with several gas dilated mid abdominal small bowel loops and decompressed colon. Electronically signed by: Katheleen Faes MD 12/03/2023 09:11 AM EDT RP Workstation: [id number]   DG Abd 1 View Result Date: 12/03/2023 EXAM: 1 VIEW XRAY OF THE ABDOMEN 12/03/2023 09:02:00 AM COMPARISON: Earlier film of the same day. CLINICAL HISTORY: [id number] Encounter for nasogastric (NG) tube placement [id number]. Confirm NG tube placement. Encounter for nasogastric (NG) tube placement [id number]. Confirm NG tube placement. FINDINGS: BOWEL: A few gaseous distended small bowel loops in the upper abdomen. Lower abdomen excluded. SOFT TISSUES: No opaque urinary calculi. BONES: No acute osseous abnormality. LINES AND TUBES: Gastric tube  has been advanced further into the decompressed stomach. IMPRESSION: 1. NG tube tip projects over the stomach, confirming placement. 2. A few gaseous distended small bowel loops in the upper abdomen. Lower abdomen excluded. Electronically signed by: Katheleen Faes MD 12/03/2023 09:09 AM EDT RP Workstation: [id number]  DG Abd 1 View Result Date: 12/02/2023 EXAM: 1 VIEW XRAY OF THE ABDOMEN 12/02/2023 11:40:00 AM COMPARISON: 12/02/2023 CLINICAL HISTORY: Ileus (HCC) 01250. Confirm NGT placement FINDINGS: BOWEL: Multiple gas-filled dilated loops of small bowel, unchanged from previous exam. SOFT TISSUES: No opaque urinary calculi. Enteric tube in place with distal tip and side port terminating within the expected location of the stomach BONES: No acute osseous abnormality. IMPRESSION: 1. Multiple gas-filled dilated loops of small bowel, unchanged from previous exam,  consistent with ileus. 2. Enteric tube in place with distal tip and side port terminating within the expected location of the stomach. Electronically signed by: Waddell Calk MD 12/02/2023 11:46 AM EDT RP Workstation: [id number]   DG Abd Portable 1V Result Date: 12/02/2023 EXAM: 1 VIEW XRAY OF THE ABDOMEN 12/02/2023 08:28:00 AM COMPARISON: Previous exam. CLINICAL HISTORY: 01250 Ileus (HCC) 01250. ileus [id number] Ileus (HCC) [id number]. ileus FINDINGS: BOWEL: Multiple gas-dilated mid abdominal small bowel loops overall relatively stable in number of involved loops and degree of dilatation. The colon is decompressed with some oral contrast distally. SOFT TISSUES: No opaque urinary calculi. BONES: Bilateral hip DJD. No acute osseous abnormality. IMPRESSION: 1. Multiple gas-dilated mid abdominal small bowel loops, relatively stable in number and degree of dilatation, consistent with ileus. 2. Decompressed colon with some oral contrast distally. Electronically signed by: Dayne Hassell MD 12/02/2023 10:18 AM EDT RP Workstation: [id number]    Medications: I have reviewed the patient's current medications.  Assessment: Ileus: NG tube output 2100 mL in 12 hours X-ray from today shows several dilated small bowel loops, decompressed colon  Potassium 4.1 Macrocytic anemia, hemoglobin 8, MCV 119.8  Plan: Biopsies from the stomach and small bowel pending for evaluation for amyloidosis.  Continue IV TPN, keep n.p.o., keep NG tube to low intermittent suction.  Keep potassium above 4 and magnesium  above 2.  Continue daily x-ray, NG tube output monitoring.  Estelita Manas, MD 12/03/2023, 12:57 PM

## 2023-12-03 NOTE — Progress Notes (Signed)
 PHARMACY - ANTICOAGULATION CONSULT NOTE  Pharmacy Consult for heparin  Indication: atrial fibrillation  Allergies  Allergen Reactions   Shrimp [Shellfish Allergy] Anaphylaxis and Swelling    Swelling throat    Reglan  [Metoclopramide ] Other (See Comments)    caused tardive dyskinesia    Patient Measurements: Height: 6' 4 (193 cm) Weight: 84.3 kg (185 lb 13.6 oz) IBW/kg (Calculated) : 86.8 HEPARIN  DW (KG): 94.6  Vital Signs: Temp: 97.9 F (36.6 C) (08/31 1600) Temp Source: Axillary (08/31 1600) BP: 103/69 (08/31 1600) Pulse Rate: 83 (08/31 1600)  Labs: Recent Labs    12/01/23 0430 12/02/23 0500 12/02/23 1817 12/03/23 0604 12/03/23 0911 12/03/23 1623  HGB 7.8* 8.0*  --  8.0*  --   --   HCT 24.8* 26.0*  --  26.6*  --   --   PLT 252 277  --  240  --   --   HEPARINUNFRC  --  0.37  --  0.19*  --  0.19*  CREATININE 0.44* 0.48* 0.45*  --  0.48*  --     Estimated Creatinine Clearance: 99.5 mL/min (A) (by C-G formula based on SCr of 0.48 mg/dL (L)).   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  Patient reports he does not have symptoms as of 02/25/14.     Diarrhea 06/04/2023   Essential hypertension 06/04/2023   Fatty liver 06/04/2023   Flu 06/04/2023   Hyperlipidemia 06/04/2023   Ileus (HCC) 06/04/2023   Multiple myeloma (HCC)    Neuropathy    Paroxysmal atrial fibrillation (HCC) 07/04/2023   Pneumonia 06/04/2023     Assessment: 73 year old male with afib on Eliquis  PTA which is currently on hold. Patient recently admitted for bowel obstruction and discharged home after conservative treatment with NGT decompression. He presented back with similar concerns, is currently on TPN.   Patient is currently on heparin  drip while apixaban   remains on hold.   Significant Events: -8/27: Several nosebleeds overnight. Op note today mentions mild NGT trauma. NG removed.  -8/28: UFH off at 06:00 for enteroscopy            UFH resumed at 17:03  Today, 12/03/23 16:23 Heparin  level = 0.19 (subtherapeutic) with IV heparin  infusing at 2050 units/hr Hgb 8.0, plts 240--stable No interruption in infusion and s/sx of bleeding, per RN.  Goal of Therapy:  Heparin  level 0.3-0.7 units/ml Monitor platelets by anticoagulation protocol: Yes   Plan:  Increase heparin  gtt to 2150 units/hr  Check heparin  level 8hrs after rate change CBC, heparin  level daily Monitor for signs of bleeding   Thank you for allowing pharmacy to be a part of this patient's care.  Eleanor EMERSON Agent, PharmD, BCPS Clinical Pharmacist [place] 12/03/2023 5:21 PM

## 2023-12-03 NOTE — Progress Notes (Signed)
 PHARMACY - ANTICOAGULATION CONSULT NOTE  Pharmacy Consult for heparin  Indication: atrial fibrillation  Allergies  Allergen Reactions   Shrimp [Shellfish Allergy] Anaphylaxis and Swelling    Swelling throat    Reglan  [Metoclopramide ] Other (See Comments)    caused tardive dyskinesia    Patient Measurements: Height: 6' 4 (193 cm) Weight: 84.3 kg (185 lb 13.6 oz) IBW/kg (Calculated) : 86.8 HEPARIN  DW (KG): 94.6  Vital Signs: Temp: 99.1 F (37.3 C) (08/31 0400) Temp Source: Axillary (08/31 0400) BP: 131/69 (08/31 0800) Pulse Rate: 81 (08/31 0800)  Labs: Recent Labs    12/01/23 0122 12/01/23 0430 12/01/23 0430 12/02/23 0500 12/02/23 1817 12/03/23 0604  HGB  --  7.8*   < > 8.0*  --  8.0*  HCT  --  24.8*  --  26.0*  --  26.6*  PLT  --  252  --  277  --  240  HEPARINUNFRC 0.40  --   --  0.37  --  0.19*  CREATININE  --  0.44*  --  0.48* 0.45*  --    < > = values in this interval not displayed.    Estimated Creatinine Clearance: 99.5 mL/min (A) (by C-G formula based on SCr of 0.45 mg/dL (L)).   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  Patient reports he does not have symptoms as of 02/25/14.     Diarrhea 06/04/2023   Essential hypertension 06/04/2023   Fatty liver 06/04/2023   Flu 06/04/2023   Hyperlipidemia 06/04/2023   Ileus (HCC) 06/04/2023   Multiple myeloma (HCC)    Neuropathy    Paroxysmal atrial fibrillation (HCC) 07/04/2023   Pneumonia 06/04/2023     Assessment: 73 year old male with afib on Eliquis  PTA which is currently on hold. Patient recently admitted for bowel obstruction and discharged home after conservative treatment with NGT decompression. He presented back with similar concerns, is currently on TPN.   Patient  is currently on heparin  drip while apixaban  remains on hold.   Significant Events: -8/27: Several nosebleeds overnight. Op note today mentions mild NGT trauma. NG removed.  -8/28: UFH off at 06:00 for enteroscopy            UFH resumed at 17:03  Today, 12/03/23 Heparin  level = 0.19 (subtherapeutic) with heparin  gtt @ 1900 units/hr No reports of heparin  being paused overnight, per RN. Hgb 8.0, plts 240--stable No s/sx of bleeding, per RN.  Goal of Therapy:  Heparin  level 0.3-0.7 units/ml Monitor platelets by anticoagulation protocol: Yes   Plan:  Increase heparin  gtt to 2050 units/hr  Check heparin  level 8hrs after rate change CBC, heparin  level daily Monitor for signs of bleeding   Lacinda Moats, PharmD Clinical Pharmacist  8/31/20258:20 AM

## 2023-12-03 NOTE — Progress Notes (Signed)
 PHARMACY - TOTAL PARENTERAL NUTRITION CONSULT NOTE   Indication: Bowel Obstruction  Patient Measurements: Height: 6' 4 (193 cm) Weight: 84.3 kg (185 lb 13.6 oz) IBW/kg (Calculated) : 86.8 TPN AdjBW (KG): 94.6 Body mass index is 22.62 kg/m. Usual Weight:   Assessment:  Pharmacy is consulted to start TPN on 73 yo male diagnosed with bowel obstruction. This admission CT abdomen shows increasing small bowel dilatation and fecalization of bowel contents involving the jejunum. No definitive transition point is identified at this time but caliber change is noted in the right mid abdomen at the junction of the jejunum and ileum. The more distal ileum is unremarkable. Some suspicion that bowel obstruction may be related to bortezomib  treatment pt receiving for multiple myeloma.   Glucose / Insulin : no hx of DM. Goal BG <150 - CBG range: 89 - 110 (no SSI used in last 24hrs) Electrolytes: Na slightly low at 134 otherwise all lytes WNL, including CorrCa (9.0) Renal: SCr < 1, BUN stable Hepatic: 8/28 labs: AST, Tbili WNL. ALT trending down. Alk Phos elevated, albumin  low Intake / Output; MIVF:  -UOP: 1250 mL -LBM 8/30 x1 -D5 @KVO  expired 8/30 AM -Per GI, patient had large episode of emesis 8/30 AM (-2.1L/24hrs) and NG tube replaced. Patient back to NPO. GI Imaging: -  Admitted from 10/14/23-10/20/23 for symptoms related to small bowel obstruction (transition point suspected in the right hemi-abdomen on CT imaging) and received non-operative management.  - Admitted 11/02/23 - 11/05/2023 for small bowel dilatation/concern for small bowel obstruction. CT imaging showed persistent small bowel dilatation with mild transition point noted deep in pelvis. He went to OR 11/03/23, findings were suspicious for intermittent volvulus of small bowel to explain his symptoms and CT imaging.  - 8/10  CT abdomen shows increasing small bowel dilatation and fecalization of bowel contents involving the jejunum. No definitive  transition point is identified at this time but caliber change is noted in the right mid abdomen at the junction of the jejunum and ileum.  GI Surgeries / Procedures:  -8/28: EGD, enteroscopy - small hiatal hernia, gastritis, duodenum/jejunum appeared normal; several biopsies taken  Central access:  PICC 8/18   Nutritional Goals: Goal TPN rate is 100  mL/hr (provides 132 g of protein and 2438 kcals per day)  RD Assessment:  Estimated Needs Total Energy Estimated Needs: 2400-2600 kcals Total Protein Estimated Needs: 120-135 grams Total Fluid Estimated Needs: >/= 2.4L  Current Nutrition:  NPO and TPN  Plan:  At 18:00 Continue TPN at 100 mL/hr Electrolytes in TPN:  Na 50 mEq/L (inc) K 40 mEq/L Ca 2.5 mEq/L Mg 5 mEq/L Phos 15 mmol/L  Cl:Ac max acetate Add standard MVI and trace elements to TPN Continue q8h Sensitive SSI and adjust as needed.  mIVF management per MD Monitor TPN labs on Mon/Thurs. Recheck BMP with AM labs tomorrow.    Lacinda Moats, PharmD Clinical Pharmacist  8/31/20258:27 AM

## 2023-12-03 NOTE — Progress Notes (Signed)
 NG tube found at 55cm with previous documentation noting 70cm. Tube readvanced and bridled in place. Abd xray ordered to verify placement.

## 2023-12-03 NOTE — Progress Notes (Signed)
 PROGRESS NOTE    David Mcgrath  FMW:[id number] DOB: January 14, 1951 DOA: 11/12/2023 PCP: [name], DO    Chief Complaint  Patient presents with   Weakness   Dizziness   Diarrhea         Brief Narrative:  73 y.o. male with medical history significant for hypertension, hyperlipidemia, paroxysmal A-fib on Eliquis , chronic anxiety/depression, polyneuropathy, multiple myeloma (diagnosed March 2025) received Bortezomib  infusion 11/09/2023. Patient was admitted here 11/02/2023 through 11/05/2023 with intractable nausea vomiting, recurrent small bowel obstruction.  He was treated conservatively with NG tube decompression and discharged home.     Last admission CT abdomen/pelvis with contrast with slight improvement in small bowel dilation when compared to prior exam but with mild transition point deep in the pelvis, no pancreatic ductal dilation, no inflammatory changes, stable free fluid within the abdomen.  General surgery was consulted and patient underwent diagnostic laparoscopy by Dr. [name] on 11/03/2023 findings of intermittently dilated small bowel with erythema of the serosa consistent with inflammation/edema, intermittent injection of the small bowel mesentery, small bowel with tendency to migrate to the left upper quadrant with long/mobile small bowel mesentery with suspicion/potential for intermittent volvulized small bowel contributing to his symptoms and findings on imaging.  Diet was slowly advanced with toleration.     This admission CT abdomen shows increasing small bowel dilatation and fecalization of bowel contents involving the jejunum. No definitive transition point is identified at this time but caliber change is noted in the right mid abdomen at the junction of the jejunum and ileum. The more distal ileum is unremarkable. Stable ground-glass opacities in the lung bases similar to that seen on prior exams.    **Interim History Has had a persistent ileus and continues  to  have gastric distention. Unfortunately lost his NG tube on 8/21 but had to be replaced by IR.  Now connected back to LIWS.  Underwent a upper GI series with small bowel follow-through and this shows contrast in the stomach with delayed gastric emptying.  Subsequently there was contrast in the rectum suggesting no complete obstruction.  GI recommending continuing erythromycin  for now and repeating abdominal x-rays and considering EGD early next week (Monday vs Tuesday) to rule out pyloric stricture and recommending ambulation.   8/25: Patient was admitted on 8/10 and I assumed care today on 8/25.  Please review prior notes.  Most of the stuff carried on for continued of care.  Further management as below.   Assessment & Plan:   Principal Problem:   Ileus (HCC) Active Problems:   Hypokalemia   Paroxysmal atrial fibrillation (HCC)   Multiple myeloma (HCC)   Essential hypertension   Depression   Hyperlipidemia   Pressure injury of skin   Protein-calorie malnutrition, severe   Hypernatremia   Swelling of lower extremity   Transaminitis   Acute urinary retention   NSVT (nonsustained ventricular tachycardia) (HCC)  #1 ileus versus partial small bowel obstruction -Patient noted to have presented with nausea vomiting diarrhea. - CT abdomen and pelvis done concerning ileus versus partial SBO. - Patient noted to have been treated conservatively. - NG tube not initially placed. - Patient initially was improving earlier on in the hospitalization and had further recurrence of nausea and vomiting. - Repeat CT abdomen and pelvis done with persistent small bowel dilatation with prominent jejunum. - Patient started a trial with erythromycin  on 11/18/2023 but was not successful as patient noted to began having large biliary emesis on 11/18/2023. - Due to allergy to  Reglan  patient was placed on IV erythromycin  and dose increased to 500 mg 3 times daily. - NG tube initially placed 11/18/2023 and  subsequently had to be replaced 11/23/2023. - Patient seen in consultation by general surgery and did not feel patient needed any repeat laparoscopy as deferred would not be beneficial. - Due to patient's prolonged hospitalization minimal nutrition patient started on TPN after PICC line was placed on 11/20/2023. - Patient subsequently underwent UGI with SBFT per surgery to assess for transit and patency which showed contrast present in the stomach with delayed gastric emptying. - Narcotics are to be minimized. - Patient seen in consultation by GI review might take several days to weeks for issue to resolve. - KUB done 11/24/2023 showed minimal bowel contrast material demonstrated.  There appears to be contrast material in the rectum suggesting no evidence of complete obstruction.  Gaseous distention of small bowel.  Gas-filled colon most likely ileus. - Repeat abdominal films done with similar diffuse gaseous distention of the bowel and abdomen and pelvis compared to prior films. - GI reconsulted and following the patient status post small bowel enteroscopy/EGD 11/30/2023 with small hiatal hernia, caustic gastritis with biopsies taken.  - NG tube discontinued postprocedure and patient initially was doing well however the morning of 12/02/2023 patient with a large bilious emesis while being examined by GI and NG tube placed and kept to suction with 1.3 L of bilious fluid was immediately drained once NG tube was placed. -Patient subsequently placed back on bowel rest. -Abdominal x-ray from 12/01/2023 consistent with ileus versus partial SBO.   - Repeat abdominal films this morning, 12/03/2023, with NG tube tip projecting over the stomach confirming placement, few gaseous distended small bowel loops in the upper abdomen.  Lower abdomen excluded.   - Passing flatus, no documented BM. - Patient was on erythromycin  and noted to unable to tolerate Reglan .  - Continue mobilization to stimulate gastric intestinal  motility with narcotic avoidance.  - Continue IV PPI.  - Per GI.    2.  Hypotension - Resolved with hydration. - BP improved.   3.  Lactic acidosis -Resolved.  4.  Leukocytosis -Likely reactive, patient noted to have large GI losses on 8/16 and subsequently became hypotensive with elevated lactic acid levels. - Patient placed on IV fluids with a bolus and maintenance fluids with normalization of lactic acid on 11/19/2023. - Low suspicion for infection. - Status post IV fluids. - Continue to hold antibiotics at this time as no further leukocytosis, patient afebrile - Patient noted to have some orthostatic hypotension on 11/28/2023 while working with PT/OT and advised Ace wrap/TED hose while working with PT.  5.  Diarrhea -Patient noted to have reported semiformed stools. - GI pathogen panel negative. - Patient likely colonized with C. difficile as testing was negative for any active infection (antigen positive, toxin negative, PCR negative). -Patient currently with an ileus. - Follow.  6.  Acute urinary retention -Foley catheter placed 11/13/2023. - Patient successfully passed a voiding trial.   - Follow-up.    7.  History of multiple myeloma -Per Dr. Von he was found somewhat patient symptoms thought to be due to chemotherapeutic medications and oncology reengaged to see if they had any further recommendations.   -Outpatient follow-up with oncology.  8.  Paroxysmal A-fib/NSVT -Not on any rate limiting medications consistently however noted to be on metoprolol  as needed prior to admission. -Patient noted yesterday evening to have a 23 beat run of nonsustained V. tach and  noted overnight to be going in and out of A-fib. -Patient with 2D echo done 07/27/2023 with a EF of 60 to 65%,NWMA, mild MVR. -Patient started on Lopressor  5 mg IV every 8 hours yesterday evening which we will continue. -Currently in normal sinus rhythm and rate controlled. - Patient noted to be on metoprolol  at  home but on a as needed basis. - Was on Eliquis  which currently on hold as patient underwent EGD/small bowel enteroscopy, 11/30/2023 and patient currently with an ileus and n.p.o. -Heparin  drip held preprocedure and has been resumed postprocedure. - Keep potassium approximately 4, magnesium  approximately 2.  9.  Left finger discoloration -Resolved. - Noted to have had some discoloration of the left fingers on 8/18 however wife stated that was present on 8/17 and actually had improved on 8/18 and subsequently 8/20. - Concern was for cyanosis of his fingertips as they were also cold. - Patient noted to to have PICC line placed in the left upper extremity 8/18 however wife stated discoloration was present prior to that; there was also some extravasation from prior IV in the left arm as well but unclear what was transfusing as it happened prior to transfer to the ICU. - Left upper extremity arterial Dopplers were done with no obstruction of the left upper extremity with triphasic waves. - Patient seen in consultation by vascular surgery who felt no surgical intervention needed at this time. - Resolution of finger discoloration 8/19.  10.  Normocytic anemia -Patient with no significant overt bleeding. - Hemoglobin stable at 8.0. - Follow H&H. - Transfusion threshold hemoglobin < 8.  11.  Hypernatremia -Likely secondary to dehydration. -Resolved with D5W. - Patient currently on TPN. - D5W discontinued.  12.  Hypokalemia/hypomagnesemia/hyponatremia -Hyponatremia resolved patient was hypernatremic, which resolved with D5W.  - Patient on TPN. - A.m. labs pending.   - Potassium noted at 3.9 yesterday evening and magnesium  at 2.1. - Repeat labs in AM.  13.  Abnormal LFTs -Acute hepatitis panel negative. - LFTs trending back down. - Follow.  14.  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 - Status post IV Lasix  x 1. - IVF saline lock.  15.  Overweight -BMI of 22.43 kg/m. - Lifestyle modification - Outpatient follow-up with PCP.  16.  Severe protein calorie malnutrition - Patient started on trial of clears, 12/01/2023 which was subsequently discontinued and patient currently n.p.o as of 12/02/2023 due to persistent ileus with nausea and emesis and currently on NG tube..   - Continue TPN per pharmacy.   DVT prophylaxis: Heparin  GTT Code Status: Full Family Communication: Updated patient.  Updated wife at bedside.  Disposition: TBD  Status is: Inpatient Remains inpatient appropriate because: Severity of of illness   Consultants:  Gastroenterology: Dr. Kriss 11/19/2023 Vascular surgery: Dr.Robins 11/20/2023 General Surgery Interventional radiology  Procedures:  CT abdomen and pelvis 11/13/2023, 11/16/2023 Upper extremity arterial duplex, left 11/21/2023 Bilateral lower extremity Dopplers 11/26/2023 SBO protocol 11/17/2023 PICC line Small bowel enteroscopy/EGD: Per GI: Dr. Rosalie 11/30/2023  Antimicrobials:  Anti-infectives (From admission, onward)    Start     Dose/Rate Route Frequency Ordered Stop   11/27/23 1200  erythromycin  500 mg in sodium chloride  0.9 % 100 mL IVPB        500 mg 100 mL/hr over 60  Minutes Intravenous Every 6 hours 11/27/23 0933 11/29/23 0723   11/24/23 1400  erythromycin  500 mg in sodium chloride  0.9 % 100 mL IVPB  Status:  Discontinued        500 mg 100 mL/hr over 60 Minutes Intravenous Every 8 hours 11/24/23 1027 11/27/23 0933   11/23/23 2000  erythromycin  250 mg in sodium chloride  0.9 % 100 mL IVPB  Status:  Discontinued        250 mg 100 mL/hr over 60 Minutes Intravenous Every 8 hours 11/23/23 1744 11/24/23 1027   11/23/23 1200  erythromycin  (E-MYCIN ) tablet 250 mg  Status:   Discontinued        250 mg Oral 3 times daily before meals 11/23/23 1004 11/23/23 1744   11/18/23 1200  erythromycin  (E-MYCIN ) tablet 250 mg  Status:  Discontinued        250 mg Oral 2 times daily with meals 11/18/23 0720 11/19/23 0734   11/13/23 1000  acyclovir  (ZOVIRAX ) tablet 400 mg  Status:  Discontinued        400 mg Oral 2 times daily 11/13/23 0503 11/19/23 0739         Subjective: Patient lying in bed, NG tube in place with bilious drainage noted.  Patient states today feels well.  Denies any chest pain or shortness of breath.  No abdominal pain.  Passing flatus.  Good urine output after Foley catheter discontinued. Patient noted yesterday while being assessed by GI to have a large episode of bilious emesis and subsequently NG tube placed and kept to suction with an initial 1.3 L of bilious fluid drained immediately after NG tube was connected to canister.   Patient feels he might have a bowel movement today.  Patient noted yesterday evening to have a 23 beat run of nonsustained V. tach however remained asymptomatic.  Patient noted to be going in and out of atrial fibrillation with some occasional PVCs on telemetry.  Wife at bedside.   Objective: Vitals:   12/03/23 0700 12/03/23 0800 12/03/23 0833 12/03/23 0900  BP: 121/67 131/69  128/70  Pulse: 71 81  88  Resp: 17 (!) 23  (!) 29  Temp:   98.5 F (36.9 C)   TempSrc:   Axillary   SpO2: 98% 98%  96%  Weight:      Height:        Intake/Output Summary (Last 24 hours) at 12/03/2023 1004 Last data filed at 12/03/2023 0949 Gross per 24 hour  Intake 2678.54 ml  Output 3350 ml  Net -671.46 ml   Filed Weights   11/27/23 0500 11/28/23 0500 12/01/23 0436  Weight: 85.1 kg 81.4 kg 84.3 kg    Examination:  General exam: NAD.  Respiratory system: Bibasilar crackles.  No wheezing.  No rhonchi.  Fair air movement.  No use of accessory muscles of respiration.  Speaking in full sentences.   Cardiovascular system: RRR no murmurs rubs or  gallops.  No JVD.  Trace to 1+ bilateral lower extremity edema.  Gastrointestinal system: Abdomen is soft, nontender, nondistended, positive bowel sounds.  No rebound.  No guarding.   Central nervous system: Alert and oriented. No focal neurological deficits. Extremities: Symmetric 5 x 5 power. Skin: No rashes, lesions or ulcers Psychiatry: Judgement and insight appear normal. Mood & affect appropriate.     Data Reviewed: I have personally reviewed following labs and imaging studies  CBC: Recent Labs  Lab 11/27/23 0510 11/29/23 0643 11/30/23 0500 12/01/23 0430 12/02/23 0500 12/03/23 0604  WBC 5.9  6.9 6.9 5.9 6.3 6.4  NEUTROABS 4.3  --   --   --   --   --   HGB 8.5* 8.1* 8.3* 7.8* 8.0* 8.0*  HCT 27.0* 27.4* 27.6* 24.8* 26.0* 26.6*  MCV 96.1 100.7* 101.5* 100.0 99.2 119.8*  PLT 249 279 287 252 277 240    Basic Metabolic Panel: Recent Labs  Lab 11/27/23 0510 11/28/23 0504 11/29/23 0643 11/30/23 0500 12/01/23 0430 12/02/23 0500 12/02/23 1817 12/03/23 0911  NA 143   < > 149* 144 140 136 133* 134*  K 3.3*   < > 4.2 4.1 4.1 4.1 3.9 4.1  CL 101   < > 114* 112* 109 106 104 104  CO2 36*   < > 27 23 23 22  21* 23  GLUCOSE 132*   < > 122* 120* 113* 91 98 111*  BUN 26*   < > 33* 33* 29* 32* 30* 25*  CREATININE 0.53*   < > 0.56* 0.55* 0.44* 0.48* 0.45* 0.48*  CALCIUM  8.2*   < > 8.5* 8.1* 7.9* 8.1* 8.0* 8.0*  MG 2.3  --  2.5* 2.3 2.2 2.2 2.1 2.0  PHOS 4.6  --  3.5 3.4 2.9 2.8  --   --    < > = values in this interval not displayed.    GFR: Estimated Creatinine Clearance: 99.5 mL/min (A) (by C-G formula based on SCr of 0.48 mg/dL (L)).  Liver Function Tests: Recent Labs  Lab 11/27/23 0510 11/30/23 0500  AST 50* 36  ALT 94* 76*  ALKPHOS 94 142*  BILITOT 0.4 0.5  PROT 5.4* 5.5*  ALBUMIN  2.0* 2.5*    CBG: Recent Labs  Lab 12/02/23 0112 12/02/23 0804 12/02/23 1543 12/02/23 2323 12/03/23 0755  GLUCAP 100* 101* 89 94 110*     No results found for this or any  previous visit (from the past 240 hours).       Radiology Studies: DG Abd Portable 1V Result Date: 12/03/2023 EXAM: 1 VIEW XRAY OF THE ABDOMEN 12/03/2023 06:14:12 AM COMPARISON: Previous exam. CLINICAL HISTORY: 01250 Ileus (HCC) [id number]. ileus FINDINGS: BOWEL: Gastric Tube extends to the decompressed stomach. Several gas dilated mid abdominal small bowel loops. Colon appears decompressed. Fecalith in the rectum. SOFT TISSUES: No opaque urinary calculi. BONES: Mild bilateral Hip DJD. No acute osseous abnormality. VASCULATURE: Femoral arterial calcifications. Aortic atherosclerosis (ICD10-I70.0). IMPRESSION: 1. Ileus with several gas dilated mid abdominal small bowel loops and decompressed colon. Electronically signed by: Katheleen Faes MD 12/03/2023 09:11 AM EDT RP Workstation: [id number]   DG Abd 1 View Result Date: 12/03/2023 EXAM: 1 VIEW XRAY OF THE ABDOMEN 12/03/2023 09:02:00 AM COMPARISON: Earlier film of the same day. CLINICAL HISTORY: [id number] Encounter for nasogastric (NG) tube placement [id number]. Confirm NG tube placement. Encounter for nasogastric (NG) tube placement [id number]. Confirm NG tube placement. FINDINGS: BOWEL: A few gaseous distended small bowel loops in the upper abdomen. Lower abdomen excluded. SOFT TISSUES: No opaque urinary calculi. BONES: No acute osseous abnormality. LINES AND TUBES: Gastric tube  has been advanced further into the decompressed stomach. IMPRESSION: 1. NG tube tip projects over the stomach, confirming placement. 2. A few gaseous distended small bowel loops in the upper abdomen. Lower abdomen excluded. Electronically signed by: Katheleen Faes MD 12/03/2023 09:09 AM EDT RP Workstation: [id number]   DG Abd 1 View Result Date: 12/02/2023 EXAM: 1 VIEW XRAY OF THE ABDOMEN 12/02/2023 11:40:00 AM COMPARISON: 12/02/2023 CLINICAL HISTORY: Ileus (HCC) 01250. Confirm NGT placement FINDINGS: BOWEL: Multiple gas-filled dilated loops of  small bowel, unchanged from previous exam. SOFT  TISSUES: No opaque urinary calculi. Enteric tube in place with distal tip and side port terminating within the expected location of the stomach BONES: No acute osseous abnormality. IMPRESSION: 1. Multiple gas-filled dilated loops of small bowel, unchanged from previous exam, consistent with ileus. 2. Enteric tube in place with distal tip and side port terminating within the expected location of the stomach. Electronically signed by: Waddell Calk MD 12/02/2023 11:46 AM EDT RP Workstation: [id number]   DG Abd Portable 1V Result Date: 12/02/2023 EXAM: 1 VIEW XRAY OF THE ABDOMEN 12/02/2023 08:28:00 AM COMPARISON: Previous exam. CLINICAL HISTORY: 01250 Ileus (HCC) 01250. ileus [id number] Ileus (HCC) [id number]. ileus FINDINGS: BOWEL: Multiple gas-dilated mid abdominal small bowel loops overall relatively stable in number of involved loops and degree of dilatation. The colon is decompressed with some oral contrast distally. SOFT TISSUES: No opaque urinary calculi. BONES: Bilateral hip DJD. No acute osseous abnormality. IMPRESSION: 1. Multiple gas-dilated mid abdominal small bowel loops, relatively stable in number and degree of dilatation, consistent with ileus. 2. Decompressed colon with some oral contrast distally. Electronically signed by: Dayne Hassell MD 12/02/2023 10:18 AM EDT RP Workstation: [id number]   DG Abd 1 View Result Date: 12/01/2023 CLINICAL DATA:  Diarrhea and ileus. EXAM: ABDOMEN - 1 VIEW COMPARISON:  Multiple recent studies with the most recent dated 11/27/2023 FINDINGS: Relatively stable dilated bowel, predominantly small bowel. The most dilated small bowel in the right abdomen shows slightly increased dilatation measuring up to 5.8 cm compared to 5.4 cm on the prior study. The colon appears relatively decompressed. Findings again are consistent with partial small bowel obstruction versus ileus. No signs of free air. IMPRESSION: Slight increase in dilatation of small bowel in the right abdomen. Findings  again are consistent with partial small bowel obstruction versus ileus. Electronically Signed   By: Marcey Moan M.D.   On: 12/01/2023 10:43        Scheduled Meds:  butamben -tetracaine -benzocaine   1 spray Topical Once   Chlorhexidine  Gluconate Cloth  6 each Topical Q2200   fluticasone   1 spray Each Nare Daily   insulin  aspart  0-9 Units Subcutaneous Q8H   metoprolol  tartrate  5 mg Intravenous Q8H   pantoprazole  (PROTONIX ) IV  40 mg Intravenous Q12H   sodium chloride  flush  10-40 mL Intracatheter Q12H   Continuous Infusions:  heparin  2,050 Units/hr (12/03/23 0833)   TPN ADULT (ION) 100 mL/hr at 12/03/23 0803     LOS: 20 days    Time spent: 40 minutes    Toribio Hummer, MD [facility]   To contact the attending provider between 7A-7P or the covering provider during after hours 7P-7A, please log into the web site [email or web address] and access using universal [place] password for that web site. If you do not have the password, please call the hospital operator.  12/03/2023, 10:04 AM

## 2023-12-04 ENCOUNTER — Inpatient Hospital Stay (HOSPITAL_COMMUNITY)

## 2023-12-04 DIAGNOSIS — E876 Hypokalemia: Secondary | ICD-10-CM | POA: Diagnosis not present

## 2023-12-04 DIAGNOSIS — E785 Hyperlipidemia, unspecified: Secondary | ICD-10-CM | POA: Diagnosis not present

## 2023-12-04 DIAGNOSIS — K567 Ileus, unspecified: Secondary | ICD-10-CM | POA: Diagnosis not present

## 2023-12-04 DIAGNOSIS — C9000 Multiple myeloma not having achieved remission: Secondary | ICD-10-CM | POA: Diagnosis not present

## 2023-12-04 LAB — CBC
HCT: 24.9 % — ABNORMAL LOW (ref 39.0–52.0)
HCT: 25.3 % — ABNORMAL LOW (ref 39.0–52.0)
HCT: 25.4 % — ABNORMAL LOW (ref 39.0–52.0)
Hemoglobin: 7.9 g/dL — ABNORMAL LOW (ref 13.0–17.0)
Hemoglobin: 8 g/dL — ABNORMAL LOW (ref 13.0–17.0)
Hemoglobin: 8 g/dL — ABNORMAL LOW (ref 13.0–17.0)
MCH: 30.5 pg (ref 26.0–34.0)
MCH: 31 pg (ref 26.0–34.0)
MCH: 31.6 pg (ref 26.0–34.0)
MCHC: 31.1 g/dL (ref 30.0–36.0)
MCHC: 31.6 g/dL (ref 30.0–36.0)
MCHC: 32.1 g/dL (ref 30.0–36.0)
MCV: 98.1 fL (ref 80.0–100.0)
MCV: 98.1 fL (ref 80.0–100.0)
MCV: 98.4 fL (ref 80.0–100.0)
Platelets: 255 K/uL (ref 150–400)
Platelets: 269 K/uL (ref 150–400)
Platelets: 269 K/uL (ref 150–400)
RBC: 2.53 MIL/uL — ABNORMAL LOW (ref 4.22–5.81)
RBC: 2.58 MIL/uL — ABNORMAL LOW (ref 4.22–5.81)
RBC: 2.59 MIL/uL — ABNORMAL LOW (ref 4.22–5.81)
RDW: 19.6 % — ABNORMAL HIGH (ref 11.5–15.5)
RDW: 19.7 % — ABNORMAL HIGH (ref 11.5–15.5)
RDW: 19.8 % — ABNORMAL HIGH (ref 11.5–15.5)
WBC: 5.2 K/uL (ref 4.0–10.5)
WBC: 5.3 K/uL (ref 4.0–10.5)
WBC: 6 K/uL (ref 4.0–10.5)
nRBC: 0 % (ref 0.0–0.2)
nRBC: 0 % (ref 0.0–0.2)
nRBC: 0 % (ref 0.0–0.2)

## 2023-12-04 LAB — COMPREHENSIVE METABOLIC PANEL WITH GFR
ALT: 40 U/L (ref 0–44)
AST: 24 U/L (ref 15–41)
Albumin: 2.5 g/dL — ABNORMAL LOW (ref 3.5–5.0)
Alkaline Phosphatase: 131 U/L — ABNORMAL HIGH (ref 38–126)
Anion gap: 9 (ref 5–15)
BUN: 22 mg/dL (ref 8–23)
CO2: 23 mmol/L (ref 22–32)
Calcium: 8.3 mg/dL — ABNORMAL LOW (ref 8.9–10.3)
Chloride: 103 mmol/L (ref 98–111)
Creatinine, Ser: 0.44 mg/dL — ABNORMAL LOW (ref 0.61–1.24)
GFR, Estimated: >60 mL/min (ref >60)
Glucose, Bld: 106 mg/dL — ABNORMAL HIGH (ref 70–99)
Potassium: 3.9 mmol/L (ref 3.5–5.1)
Sodium: 135 mmol/L (ref 135–145)
Total Bilirubin: 0.4 mg/dL (ref 0.0–1.2)
Total Protein: 5.4 g/dL — ABNORMAL LOW (ref 6.5–8.1)

## 2023-12-04 LAB — HEPARIN LEVEL (UNFRACTIONATED)
Heparin Unfractionated: 0.38 [IU]/mL (ref 0.30–0.70)
Heparin Unfractionated: 0.41 [IU]/mL (ref 0.30–0.70)

## 2023-12-04 LAB — GLUCOSE, CAPILLARY
Glucose-Capillary: 114 mg/dL — ABNORMAL HIGH (ref 70–99)
Glucose-Capillary: 76 mg/dL (ref 70–99)
Glucose-Capillary: 115 mg/dL — ABNORMAL HIGH (ref 70–99)

## 2023-12-04 LAB — HEMOGLOBIN AND HEMATOCRIT, BLOOD
HCT: 27.3 % — ABNORMAL LOW (ref 39.0–52.0)
Hemoglobin: 8.6 g/dL — ABNORMAL LOW (ref 13.0–17.0)

## 2023-12-04 LAB — MAGNESIUM: Magnesium: 2.2 mg/dL (ref 1.7–2.4)

## 2023-12-04 LAB — TRIGLYCERIDES: Triglycerides: 53 mg/dL (ref <150)

## 2023-12-04 LAB — PHOSPHORUS: Phosphorus: 3.3 mg/dL (ref 2.5–4.6)

## 2023-12-04 MED ORDER — POTASSIUM CHLORIDE 10 MEQ/100ML IV SOLN
10.0000 meq | INTRAVENOUS | Status: AC
  Administered 2023-12-04 (×4): 10 meq via INTRAVENOUS
  Filled 2023-12-04 (×4): qty 100

## 2023-12-04 MED ORDER — TRAVASOL 10 % IV SOLN
INTRAVENOUS | Status: AC
  Filled 2023-12-04: qty 1320

## 2023-12-04 MED ORDER — PANTOPRAZOLE SODIUM 40 MG IV SOLR
40.0000 mg | Freq: Two times a day (BID) | INTRAVENOUS | Status: DC
  Administered 2023-12-04 – 2023-12-20 (×33): 40 mg via INTRAVENOUS
  Filled 2023-12-04 (×33): qty 10

## 2023-12-04 NOTE — Plan of Care (Signed)
  Problem: Coping: Goal: Level of anxiety will decrease Outcome: Progressing   Problem: Safety: Goal: Ability to remain free from injury will improve Outcome: Progressing   Problem: Coping: Goal: Ability to adjust to condition or change in health will improve Outcome: Progressing   Problem: Fluid Volume: Goal: Ability to maintain a balanced intake and output will improve Outcome: Progressing   Problem: Metabolic: Goal: Ability to maintain appropriate glucose levels will improve Outcome: Progressing

## 2023-12-04 NOTE — Progress Notes (Signed)
 PHARMACY - ANTICOAGULATION CONSULT NOTE  Pharmacy Consult for heparin  Indication: atrial fibrillation  Allergies  Allergen Reactions   Shrimp [Shellfish Allergy] Anaphylaxis and Swelling    Swelling throat    Reglan  [Metoclopramide ] Other (See Comments)    caused tardive dyskinesia    Patient Measurements: Height: 6' 4 (193 cm) Weight: 84.3 kg (185 lb 13.6 oz) IBW/kg (Calculated) : 86.8 HEPARIN  DW (KG): 94.6  Vital Signs: Temp: 97.9 F (36.6 C) (08/31 2000) Temp Source: Axillary (08/31 1600) BP: 113/94 (08/31 2000) Pulse Rate: 83 (08/31 2000)  Labs: Recent Labs    12/01/23 0430 12/02/23 0500 12/02/23 1817 12/03/23 0604 12/03/23 0911 12/03/23 1623 12/03/23 2334  HGB 7.8* 8.0*  --  8.0*  --   --   --   HCT 24.8* 26.0*  --  26.6*  --   --   --   PLT 252 277  --  240  --   --   --   HEPARINUNFRC  --  0.37  --  0.19*  --  0.19* 0.38  CREATININE 0.44* 0.48* 0.45*  --  0.48*  --   --     Estimated Creatinine Clearance: 99.5 mL/min (A) (by C-G formula based on SCr of 0.48 mg/dL (L)).   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  Patient reports he does not have symptoms as of 02/25/14.     Diarrhea 06/04/2023   Essential hypertension 06/04/2023   Fatty liver 06/04/2023   Flu 06/04/2023   Hyperlipidemia 06/04/2023   Ileus (HCC) 06/04/2023   Multiple myeloma (HCC)    Neuropathy    Paroxysmal atrial fibrillation (HCC) 07/04/2023   Pneumonia 06/04/2023     Assessment: 73 year old male with afib on Eliquis  PTA which is currently on hold. Patient recently admitted for bowel obstruction and discharged home after conservative treatment with NGT decompression. He presented back with similar concerns, is currently on TPN.   Patient is  currently on heparin  drip while apixaban  remains on hold.   Significant Events: -8/27: Several nosebleeds overnight. Op note today mentions mild NGT trauma. NG removed.  -8/28: UFH off at 06:00 for enteroscopy            UFH resumed at 17:03  Today, 12/04/23 RN notified pharmacy ~ 2300 of new blood noticed in NGT line.  See RN note re: assessment.  NGT was advanced earlier in the day.  Provider is aware of events. Had heparin  level drawn at 23:30 due to new bleeding report 23:34 heparin  level = 0.38 (therapeutic) with IV heparin  gtt @ 2150 units/hr  Goal of Therapy:  Heparin  level 0.3-0.7 units/ml Monitor platelets by anticoagulation protocol: Yes   Plan:  Continue heparin  gtt @ 2150 units/hr  Recheck heparin  level in 8hrs to confirm therapeutic dose CBC, heparin  level daily Monitor for signs of bleeding   Thank you for allowing pharmacy to be a part of this patient's care.  Arvin Gauss, PharmD 12/04/2023 12:18 AM

## 2023-12-04 NOTE — Progress Notes (Signed)
 Physical Therapy Treatment Patient Details Name: David Mcgrath MRN: [id number] DOB: 03/27/1951 Today's Date: 12/04/2023   History of Present Illness Pt is 73 yo male who presented on 11/12/23 with nausea, vomiting, diarrhea, hypotension.  CT raised concern for ileus vs partial SBO.  Pt recently admitted 7/31-8/3 for SBO, pt underwent diagnostic laparoscopy on 8/1.  This admission pt with persistent ileus, treated with NG tube 8/16 and replaced 8/21.  Surgery consulted and did not feel repeat laparoscopy beneficial.  TPN started 8/18. GI reconsulted for possible EGD but continuing erythromycin  for now. Pt also with hypotension, urinary retention, PAF, hyponatremia/hypokalemia/hypomagnesemia/hypoglycemia.  Pt also with LE edema - findings consistent with chronic DVT.  Pt with hx including but not limited to  hypertension, hyperlipidemia, paroxysmal A-fib on Eliquis , chronic anxiety/depression, polyneuropathy, multiple myeloma (diagnosed March 2025) received Bortezomib  infusion 11/09/2023.    PT Comments   Patient  requiring much encouragement to mobilize at this time. Patient trying to explain why he wants to wait  but not able to clearly explain.  Patient did mobilize to sitting and standing and pivot with RW to recliner. Patient requiring +2  mod assist for all aspects of mobility. HR 114, SPo2 on RA 99, RR 30's.  Patient will benefit from continued inpatient follow up therapy, <3 hours/day    If plan is discharge home, recommend the following: Assistance with cooking/housework;Help with stairs or ramp for entrance;Assist for transportation;Two people to help with walking and/or transfers;A lot of help with bathing/dressing/bathroom   Can travel by private vehicle     No  Equipment Recommendations  None recommended by PT    Recommendations for Other Services       Precautions / Restrictions Precautions Precautions: Fall Recall of Precautions/Restrictions:  Intact Precaution/Restrictions Comments: , watch BP, TEDS Restrictions Weight Bearing Restrictions Per Provider Order: No     Mobility  Bed Mobility   Bed Mobility: Rolling, Sidelying to Sit Rolling: Mod assist Sidelying to sit: Mod assist       General bed mobility comments: multimodal cuees to  proceed with rolling to get cleaned, therefore required more assistance as patient was trying to not get up. mod assist with legs and trunk to sitting.    Transfers Overall transfer level: Needs assistance Equipment used: Rolling walker (2 wheels) Transfers: Bed to chair/wheelchair/BSC, Sit to/from Stand Sit to Stand: Mod assist, +2 physical assistance, +2 safety/equipment, From elevated surface   Step pivot transfers: Mod assist, +2 physical assistance, +2 safety/equipment, From elevated surface       General transfer comment: steady support,  small steps to turn to recliner, decreased control of descent.    Ambulation/Gait                   Stairs             Wheelchair Mobility     Tilt Bed    Modified Rankin (Stroke Patients Only)       Balance   Sitting-balance support: Bilateral upper extremity supported, Feet supported Sitting balance-Leahy Scale: Fair Sitting balance - Comments: head forward posture   Standing balance support: During functional activity, Reliant on assistive device for balance, Bilateral upper extremity supported Standing balance-Leahy Scale: Poor                              Communication Communication Factors Affecting Communication: Difficulty expressing self  Cognition Arousal: Alert Behavior During Therapy: Anxious  PT - Cognitive impairments: Difficult to assess Difficult to assess due to: Impaired communication                     PT - Cognition Comments: difficulty expressing self, stating that he  has 3 things to do but not able to  list. patient  required much instructio on why he is going  to the otrher side of the  bed from last visit.   Following commands impaired: Follows one step commands with increased time    Cueing Cueing Techniques: Verbal cues, Tactile cues  Exercises      General Comments        Pertinent Vitals/Pain Pain Assessment Faces Pain Scale: Hurts a little bit Pain Location: generalized discomfort voiced no specific region Pain Descriptors / Indicators: Grimacing Pain Intervention(s): Monitored during session    Home Living                          Prior Function            PT Goals (current goals can now be found in the care plan section) Progress towards PT goals: Progressing toward goals    Frequency    Min 2X/week      PT Plan      Co-evaluation              AM-PAC PT 6 Clicks Mobility   Outcome Measure  Help needed turning from your back to your side while in a flat bed without using bedrails?: A Lot Help needed moving from lying on your back to sitting on the side of a flat bed without using bedrails?: A Lot Help needed moving to and from a bed to a chair (including a wheelchair)?: A Lot Help needed standing up from a chair using your arms (e.g., wheelchair or bedside chair)?: A Lot Help needed to walk in hospital room?: Total Help needed climbing 3-5 steps with a railing? : Total 6 Click Score: 10    End of Session Equipment Utilized During Treatment: Gait belt Activity Tolerance: Patient tolerated treatment well;Patient limited by fatigue Patient left: in chair;with call bell/phone within reach;with chair alarm set;with family/visitor present Nurse Communication: Mobility status;Other (comment) PT Visit Diagnosis: Unsteadiness on feet (R26.81);Other abnormalities of gait and mobility (R26.89);Muscle weakness (generalized) (M62.81)     Time: 8870-8847 PT Time Calculation (min) (ACUTE ONLY): 23 min  Charges:    $Therapeutic Activity: 23-37 mins PT General Charges $$ ACUTE PT VISIT: 1  Visit                     {David Mcgrath [facility] PT Acute Rehabilitation Services Office [phone]  David Mcgrath 12/04/2023, 1:50 PM

## 2023-12-04 NOTE — Progress Notes (Signed)
 Subjective: Patient states he had 2 bowel movements today morning, described as loose and watery.  There was some bright red blood noted in NG tube yesterday which has not recurred and NG tube is now draining green bilious fluid.  Objective: Vital signs in last 24 hours: Temp:  [97.9 F (36.6 C)-98.9 F (37.2 C)] 98.4 F (36.9 C) (09/01 1103) Pulse Rate:  [69-87] 87 (09/01 1000) Resp:  [15-34] 34 (09/01 1000) BP: (103-130)/(43-94) 126/58 (09/01 1000) SpO2:  [93 %-100 %] 99 % (09/01 1000) Weight:  [81.3 kg] 81.3 kg (09/01 0500) Weight change:  Last BM Date : 12/04/23  PE: Sitting on bedside chair with NG tube draining bilious fluid GENERAL: Mild pallor, no icterus  ABDOMEN: Soft, nondistended, nontender, hypoactive bowel sounds EXTREMITIES: No deformity  Lab Results: Results for orders placed or performed during the hospital encounter of 11/12/23 (from the past 48 hours)  Glucose, capillary     Status: None   Collection Time: 12/02/23  3:43 PM  Result Value Ref Range   Glucose-Capillary 89 70 - 99 mg/dL    Comment: Glucose reference range applies only to samples taken after fasting for at least 8 hours.   Comment 1 Document in Chart   Basic metabolic panel     Status: Abnormal   Collection Time: 12/02/23  6:17 PM  Result Value Ref Range   Sodium 133 (L) 135 - 145 mmol/L   Potassium 3.9 3.5 - 5.1 mmol/L   Chloride 104 98 - 111 mmol/L   CO2 21 (L) 22 - 32 mmol/L   Glucose, Bld 98 70 - 99 mg/dL    Comment: Glucose reference range applies only to samples taken after fasting for at least 8 hours.   BUN 30 (H) 8 - 23 mg/dL   Creatinine, Ser 9.54 (L) 0.61 - 1.24 mg/dL   Calcium  8.0 (L) 8.9 - 10.3 mg/dL   GFR, Estimated >39 >39 mL/min    Comment: (NOTE) Calculated using the CKD-EPI Creatinine Equation (2021)    Anion gap 8 5 - 15    Comment: Performed at [facility], [address] [place], [place] [address]  Magnesium      Status: None   Collection Time:  12/02/23  6:17 PM  Result Value Ref Range   Magnesium  2.1 1.7 - 2.4 mg/dL    Comment: Performed at [facility], [address], [place], [place] [address]  Glucose, capillary     Status: None   Collection Time: 12/02/23 11:23 PM  Result Value Ref Range   Glucose-Capillary 94 70 - 99 mg/dL    Comment: Glucose reference range applies only to samples taken after fasting for at least 8 hours.  CBC     Status: Abnormal   Collection Time: 12/03/23  6:04 AM  Result Value Ref Range   WBC 6.4 4.0 - 10.5 K/uL   RBC 2.22 (L) 4.22 - 5.81 MIL/uL   Hemoglobin 8.0 (L) 13.0 - 17.0 g/dL   HCT 73.3 (L) 60.9 - 47.9 %   MCV 119.8 (H) 80.0 - 100.0 fL    Comment: DELTA CHECK NOTED REPEATED TO VERIFY    MCH 36.0 (H) 26.0 - 34.0 pg   MCHC 30.1 30.0 - 36.0 g/dL   RDW 79.3 (H) 88.4 - 84.4 %   Platelets 240 150 - 400 K/uL   nRBC 0.0 0.0 - 0.2 %    Comment: Performed at [facility], [address] [place], [place] [address]  Heparin  level (unfractionated)     Status: Abnormal   Collection Time: 12/03/23  6:04 AM  Result Value Ref Range   Heparin  Unfractionated 0.19 (L) 0.30 - 0.70 IU/mL    Comment: (NOTE) The clinical reportable range upper limit is being lowered to >1.10 to align with the FDA approved guidance for the current laboratory assay.  If heparin  results are below expected values, and patient dosage has  been confirmed, suggest follow up testing of antithrombin III levels. Performed at [facility], [address] [place], [place] [address]   Glucose, capillary     Status: Abnormal   Collection Time: 12/03/23  7:55 AM  Result Value Ref Range   Glucose-Capillary 110 (H) 70 - 99 mg/dL    Comment: Glucose reference range applies only to samples taken after fasting for at least 8 hours.  Basic metabolic panel with GFR     Status: Abnormal   Collection Time: 12/03/23  9:11 AM  Result Value Ref Range   Sodium 134 (L) 135 - 145  mmol/L   Potassium 4.1 3.5 - 5.1 mmol/L   Chloride 104 98 - 111 mmol/L   CO2 23 22 - 32 mmol/L   Glucose, Bld 111 (H) 70 - 99 mg/dL    Comment: Glucose reference range applies only to samples taken after fasting for at least 8 hours.   BUN 25 (H) 8 - 23 mg/dL   Creatinine, Ser 9.51 (L) 0.61 - 1.24 mg/dL   Calcium  8.0 (L) 8.9 - 10.3 mg/dL   GFR, Estimated >39 >39 mL/min    Comment: (NOTE) Calculated using the CKD-EPI Creatinine Equation (2021)    Anion gap 8 5 - 15    Comment: Performed at [facility], [address] [place], [place] [address]  Magnesium      Status: None   Collection Time: 12/03/23  9:11 AM  Result Value Ref Range   Magnesium  2.0 1.7 - 2.4 mg/dL    Comment: Performed at [facility], [address] [place], [place] [address]  Glucose, capillary     Status: None   Collection Time: 12/03/23  3:10 PM  Result Value Ref Range   Glucose-Capillary 94 70 - 99 mg/dL    Comment: Glucose reference range applies only to samples taken after fasting for at least 8 hours.  Heparin  level (unfractionated)     Status: Abnormal   Collection Time: 12/03/23  4:23 PM  Result Value Ref Range   Heparin  Unfractionated 0.19 (L) 0.30 - 0.70 IU/mL    Comment: (NOTE) The clinical reportable range upper limit is being lowered to >1.10 to align with the FDA approved guidance for the current laboratory assay.  If heparin  results are below expected values, and patient dosage has  been confirmed, suggest follow up testing of antithrombin III levels. Performed at [facility], [address], [place], [place] [address]   Heparin  level (unfractionated)     Status: None   Collection Time: 12/03/23 11:34 PM  Result Value Ref Range   Heparin  Unfractionated 0.38 0.30 - 0.70 IU/mL    Comment: (NOTE) The clinical reportable range upper limit is being lowered to >1.10 to align with the FDA approved guidance for the current  laboratory assay.  If heparin  results are below expected values, and patient dosage has  been confirmed, suggest follow up testing of antithrombin III levels. Performed at [facility], [address], [place], [place] [address]   Glucose,  capillary     Status: None   Collection Time: 12/03/23 11:42 PM  Result Value Ref Range   Glucose-Capillary 98 70 - 99 mg/dL    Comment: Glucose reference range applies only to samples taken after fasting for at least 8 hours.  CBC     Status: Abnormal   Collection Time: 12/03/23 11:43 PM  Result Value Ref Range   WBC 5.2 4.0 - 10.5 K/uL   RBC 2.59 (L) 4.22 - 5.81 MIL/uL   Hemoglobin 7.9 (L) 13.0 - 17.0 g/dL   HCT 74.5 (L) 60.9 - 47.9 %   MCV 98.1 80.0 - 100.0 fL    Comment: DELTA CHECK NOTED   MCH 30.5 26.0 - 34.0 pg   MCHC 31.1 30.0 - 36.0 g/dL   RDW 80.1 (H) 88.4 - 84.4 %   Platelets 269 150 - 400 K/uL   nRBC 0.0 0.0 - 0.2 %    Comment: Performed at [facility], [address], [place], [place] [address]  Comprehensive metabolic panel     Status: Abnormal   Collection Time: 12/04/23  5:02 AM  Result Value Ref Range   Sodium 135 135 - 145 mmol/L   Potassium 3.9 3.5 - 5.1 mmol/L   Chloride 103 98 - 111 mmol/L   CO2 23 22 - 32 mmol/L   Glucose, Bld 106 (H) 70 - 99 mg/dL    Comment: Glucose reference range applies only to samples taken after fasting for at least 8 hours.   BUN 22 8 - 23 mg/dL   Creatinine, Ser 9.55 (L) 0.61 - 1.24 mg/dL   Calcium  8.3 (L) 8.9 - 10.3 mg/dL   Total Protein 5.4 (L) 6.5 - 8.1 g/dL   Albumin  2.5 (L) 3.5 - 5.0 g/dL   AST 24 15 - 41 U/L   ALT 40 0 - 44 U/L   Alkaline Phosphatase 131 (H) 38 - 126 U/L   Total Bilirubin 0.4 0.0 - 1.2 mg/dL   GFR, Estimated >39 >39 mL/min    Comment: (NOTE) Calculated using the CKD-EPI Creatinine Equation (2021)    Anion gap 9 5 - 15    Comment: Performed at [facility], [address], [place], [place] [address]   Magnesium      Status: None   Collection Time: 12/04/23  5:02 AM  Result Value Ref Range   Magnesium  2.2 1.7 - 2.4 mg/dL    Comment: Performed at [facility], [address] [place], [place] [address]  Phosphorus     Status: None   Collection Time: 12/04/23  5:02 AM  Result Value Ref Range   Phosphorus 3.3 2.5 - 4.6 mg/dL    Comment: Performed at [facility], [address], [place], [place] [address]  Triglycerides     Status: None   Collection Time: 12/04/23  5:02 AM  Result Value Ref Range   Triglycerides 53 <150 mg/dL    Comment: Performed at [facility], [address], [place], [place] [address]  CBC     Status: Abnormal   Collection Time: 12/04/23  5:02 AM  Result Value Ref Range   WBC 5.3 4.0 - 10.5 K/uL   RBC 2.53 (L) 4.22 - 5.81 MIL/uL   Hemoglobin 8.0 (L) 13.0 - 17.0 g/dL   HCT 75.0 (L) 60.9 - 47.9 %   MCV 98.4 80.0 - 100.0 fL   MCH 31.6 26.0 - 34.0 pg   MCHC 32.1 30.0 - 36.0  g/dL   RDW 80.3 (H) 88.4 - 84.4 %   Platelets 255 150 - 400 K/uL   nRBC 0.0 0.0 - 0.2 %    Comment: Performed at [facility], [address] [place], [place] [address]  Glucose, capillary     Status: Abnormal   Collection Time: 12/04/23  7:18 AM  Result Value Ref Range   Glucose-Capillary 114 (H) 70 - 99 mg/dL    Comment: Glucose reference range applies only to samples taken after fasting for at least 8 hours.   Comment 1 Notify RN   CBC     Status: Abnormal   Collection Time: 12/04/23  8:38 AM  Result Value Ref Range   WBC 6.0 4.0 - 10.5 K/uL   RBC 2.58 (L) 4.22 - 5.81 MIL/uL   Hemoglobin 8.0 (L) 13.0 - 17.0 g/dL   HCT 74.6 (L) 60.9 - 47.9 %   MCV 98.1 80.0 - 100.0 fL   MCH 31.0 26.0 - 34.0 pg   MCHC 31.6 30.0 - 36.0 g/dL   RDW 80.2 (H) 88.4 - 84.4 %   Platelets 269 150 - 400 K/uL   nRBC 0.0 0.0 - 0.2 %    Comment: Performed at [facility] [address] [place], [place] [address]   Heparin  level (unfractionated)     Status: None   Collection Time: 12/04/23  8:38 AM  Result Value Ref Range   Heparin  Unfractionated 0.41 0.30 - 0.70 IU/mL    Comment: (NOTE) The clinical reportable range upper limit is being lowered to >1.10 to align with the FDA approved guidance for the current laboratory assay.  If heparin  results are below expected values, and patient dosage has  been confirmed, suggest follow up testing of antithrombin III levels. Performed at [facility], [address], [place], [place] [address]     Studies/Results: DG Abd Portable 1V Result Date: 12/04/2023 CLINICAL DATA:  [id number] Ileus [facility]) [id number] EXAM: PORTABLE ABDOMEN - 1 VIEW COMPARISON:  December 03, 2023 FINDINGS: Diffuse gaseous distension of loops of small and large bowel. Enteric tube tip and side port project over the stomach. Degree of dilation is similar compared to prior with the most prominent loops of bowel in the upper abdomen. Air is visualized in the rectum. IMPRESSION: Similar degree of gaseous distension of loops of small and large bowel suggestive of ileus. Electronically Signed   By: Corean Salter M.D.   On: 12/04/2023 10:39   DG Abd Portable 1V Result Date: 12/03/2023 EXAM: 1 VIEW XRAY OF THE ABDOMEN 12/03/2023 06:14:12 AM COMPARISON: Previous exam. CLINICAL HISTORY: 01250 Ileus ([facility]) [id number]. ileus FINDINGS: BOWEL: Gastric Tube extends to the decompressed stomach. Several gas dilated mid abdominal small bowel loops. Colon appears decompressed. Fecalith in the rectum. SOFT TISSUES: No opaque urinary calculi. BONES: Mild bilateral Hip DJD. No acute osseous abnormality. VASCULATURE: Femoral arterial calcifications. Aortic atherosclerosis (ICD10-I70.0). IMPRESSION: 1. Ileus with several gas dilated mid abdominal small bowel loops and decompressed colon. Electronically signed by: Katheleen Faes MD 12/03/2023 09:11 AM EDT RP Workstation: [id number]   DG Abd 1 View Result Date:  12/03/2023 EXAM: 1 VIEW XRAY OF THE ABDOMEN 12/03/2023 09:02:00 AM COMPARISON: Earlier film of the same day. CLINICAL HISTORY: [id number] Encounter for nasogastric (NG) tube placement [id number]. Confirm NG tube placement. Encounter for nasogastric (NG) tube placement [id number]. Confirm NG tube placement. FINDINGS: BOWEL: A few gaseous distended small bowel loops in the upper abdomen. Lower abdomen excluded. SOFT TISSUES: No opaque  urinary calculi. BONES: No acute osseous abnormality. LINES AND TUBES: Gastric tube  has been advanced further into the decompressed stomach. IMPRESSION: 1. NG tube tip projects over the stomach, confirming placement. 2. A few gaseous distended small bowel loops in the upper abdomen. Lower abdomen excluded. Electronically signed by: Dayne Hassell MD 12/03/2023 09:09 AM EDT RP Workstation: [id number]    Medications: I have reviewed the patient's current medications.  Assessment: Ileus X-ray shows similar degree of gaseous distention of loops of small and large bowel suggestive of ileus NG tube output 1750 mL in last 12 hours On IV TPN for nutrition Status post diagnostic laparoscopy 11/03/2023: Suspicion for intermittent volvulized small bowel contributing to her symptoms Status post EGD/enteroscopy, biopsy of stomach and small bowel pending   Normal potassium 3.9, normal magnesium  2.2, normal phosphorus 3.3, normal renal function Mild malnutrition, albumin  2.5 with total protein 5.4 Normocytic anemia, hemoglobin 8  Paroxysmal A-fib, Eliquis  on hold, currently on IV heparin   Multiple myeloma diagnosed in 3/25 and received bortezomib  infusions 11/09/2023, polyneuropathy, chronic anxiety, depression  Plan: Continue GI keep n.p.o., NG tube to intermittent suction and IV TPN Will follow biopsies from stomach and small bowel for amyloidosis Dr. Dianna to follow in a.m.  Estelita Manas, MD 12/04/2023, 11:57 AM

## 2023-12-04 NOTE — Progress Notes (Addendum)
 PHARMACY - TOTAL PARENTERAL NUTRITION CONSULT NOTE   Indication: Bowel Obstruction  Patient Measurements: Height: 6' 4 (193 cm) Weight: 81.3 kg (179 lb 3.7 oz) IBW/kg (Calculated) : 86.8 TPN AdjBW (KG): 94.6 Body mass index is 21.82 kg/m.  Assessment:  Pharmacy is consulted to start TPN on 73 yo male diagnosed with bowel obstruction. This admission CT abdomen shows increasing small bowel dilatation and fecalization of bowel contents involving the jejunum. No definitive transition point is identified at this time but caliber change is noted in the right mid abdomen at the junction of the jejunum and ileum. The more distal ileum is unremarkable. Some suspicion that bowel obstruction may be related to bortezomib  treatment pt receiving for multiple myeloma.   Glucose / Insulin : no hx of DM. Goal BG <150 - CBG range: 94 - 114 (no SSI used in last 24hrs) Electrolytes: WNL, including CorrCa (9.5) Renal: SCr < 1, BUN stable Hepatic: LFTs WNL, alb 2.5; TG 53 Intake / Output; MIVF:  -UOP ~ 1700 ml/24 hr -GI note large liquid BM 8/31 -NG output > 1 L documented since yesterday morning GI Imaging: -Admitted from 10/14/23-10/20/23 for symptoms related to small bowel obstruction (transition point suspected in the right hemi-abdomen on CT imaging) and received non-operative management.  -Admitted 11/02/23 - 11/05/2023 for small bowel dilatation/concern for small bowel obstruction. CT imaging showed persistent small bowel dilatation with mild transition point noted deep in pelvis. He went to OR 11/03/23, findings were suspicious for intermittent volvulus of small bowel to explain his symptoms and CT imaging.  -8/10  CT abdomen shows increasing small bowel dilatation and fecalization of bowel contents involving the jejunum. No definitive transition point is identified at this time but caliber change is noted in the right mid abdomen at the junction of the jejunum and ileum.  GI Surgeries / Procedures:  -8/28:  EGD, enteroscopy - small hiatal hernia, gastritis, duodenum/jejunum appeared normal; several biopsies taken  Central access:  PICC 8/18   Nutritional Goals: Goal TPN rate is 100  mL/hr (provides 132 g of protein and 2438 kcals per day)  RD Assessment:  Estimated Needs Total Energy Estimated Needs: 2400-2600 kcals Total Protein Estimated Needs: 120-135 grams Total Fluid Estimated Needs: >/= 2.4L  Current Nutrition:  NPO and TPN  Plan:  KCl 10 mEq IV x 4 per MD Continue TPN at 100 mL/hr Electrolytes in TPN:  Na 50 mEq/L K 40 mEq/L Ca 2.5 mEq/L Mg 5 mEq/L Phos 15 mmol/L  Cl:Ac max acetate Add standard MVI and trace elements to TPN Continue q8h Sensitive SSI and adjust as needed Monitor TPN labs on Mon/Thurs   Stefano MARLA Bologna, PharmD, BCPS Clinical Pharmacist 12/04/2023 9:52 AM

## 2023-12-04 NOTE — Progress Notes (Signed)
 PROGRESS NOTE    David Mcgrath  FMW:[id number] DOB: 05/09/1950 DOA: 11/12/2023 PCP: [name], DO    Chief Complaint  Patient presents with   Weakness   Dizziness   Diarrhea         Brief Narrative:  73 y.o. male with medical history significant for hypertension, hyperlipidemia, paroxysmal A-fib on Eliquis , chronic anxiety/depression, polyneuropathy, multiple myeloma (diagnosed March 2025) received Bortezomib  infusion 11/09/2023. Patient was admitted here 11/02/2023 through 11/05/2023 with intractable nausea vomiting, recurrent small bowel obstruction.  He was treated conservatively with NG tube decompression and discharged home.     Last admission CT abdomen/pelvis with contrast with slight improvement in small bowel dilation when compared to prior exam but with mild transition point deep in the pelvis, no pancreatic ductal dilation, no inflammatory changes, stable free fluid within the abdomen.  General surgery was consulted and patient underwent diagnostic laparoscopy by Dr. [name] on 11/03/2023 findings of intermittently dilated small bowel with erythema of the serosa consistent with inflammation/edema, intermittent injection of the small bowel mesentery, small bowel with tendency to migrate to the left upper quadrant with long/mobile small bowel mesentery with suspicion/potential for intermittent volvulized small bowel contributing to his symptoms and findings on imaging.  Diet was slowly advanced with toleration.     This admission CT abdomen shows increasing small bowel dilatation and fecalization of bowel contents involving the jejunum. No definitive transition point is identified at this time but caliber change is noted in the right mid abdomen at the junction of the jejunum and ileum. The more distal ileum is unremarkable. Stable ground-glass opacities in the lung bases similar to that seen on prior exams.    **Interim History Has had a persistent ileus and continues  to  have gastric distention. Unfortunately lost his NG tube on 8/21 but had to be replaced by IR.  Now connected back to [facility].  Underwent a upper GI series with small bowel follow-through and this shows contrast in the stomach with delayed gastric emptying.  Subsequently there was contrast in the rectum suggesting no complete obstruction.  GI recommending continuing erythromycin  for now and repeating abdominal x-rays and considering EGD early next week (Monday vs Tuesday) to rule out pyloric stricture and recommending ambulation.   8/25: Patient was admitted on 8/10 and I assumed care today on 8/25.  Please review prior notes.  Most of the stuff carried on for continued of care.  Further management as below.   Assessment & Plan:   Principal Problem:   Ileus (HCC) Active Problems:   Hypokalemia   Paroxysmal atrial fibrillation (HCC)   Multiple myeloma (HCC)   Essential hypertension   Depression   Hyperlipidemia   Pressure injury of skin   Protein-calorie malnutrition, severe   Hypernatremia   Swelling of lower extremity   Transaminitis   Acute urinary retention   NSVT (nonsustained ventricular tachycardia) (HCC)  #1 ileus versus partial small bowel obstruction -Patient noted to have presented with nausea vomiting diarrhea. - CT abdomen and pelvis done concerning ileus versus partial SBO. - Patient noted to have been treated conservatively. - NG tube not initially placed. - Patient initially was improving earlier on in the hospitalization and had further recurrence of nausea and vomiting. - Repeat CT abdomen and pelvis done with persistent small bowel dilatation with prominent jejunum. - Patient started a trial with erythromycin  on 11/18/2023 but was not successful as patient noted to began having large biliary emesis on 11/18/2023. - Due to allergy to  Reglan  patient was placed on IV erythromycin  and dose increased to 500 mg 3 times daily. - NG tube initially placed 11/18/2023 and  subsequently had to be replaced 11/23/2023. - Patient seen in consultation by general surgery and did not feel patient needed any repeat laparoscopy as deferred would not be beneficial. - Due to patient's prolonged hospitalization minimal nutrition patient started on TPN after PICC line was placed on 11/20/2023. - Patient subsequently underwent UGI with SBFT per surgery to assess for transit and patency which showed contrast present in the stomach with delayed gastric emptying. - Narcotics are to be minimized. - Patient seen in consultation by GI review might take several days to weeks for issue to resolve. - KUB done 11/24/2023 showed minimal bowel contrast material demonstrated.  There appears to be contrast material in the rectum suggesting no evidence of complete obstruction.  Gaseous distention of small bowel.  Gas-filled colon most likely ileus. - Repeat abdominal films done with similar diffuse gaseous distention of the bowel and abdomen and pelvis compared to prior films. - GI reconsulted and following the patient status post small bowel enteroscopy/EGD 11/30/2023 with small hiatal hernia, caustic gastritis with biopsies taken.  - NG tube discontinued postprocedure and patient initially was doing well however the morning of 12/02/2023 patient with a large bilious emesis while being examined by GI and NG tube placed and kept to suction with 1.3 L of bilious fluid was immediately drained once NG tube was placed. -Patient subsequently placed back on bowel rest. -Abdominal x-ray from 12/01/2023 consistent with ileus versus partial SBO.   - Repeat abdominal films on, 12/03/2023, with NG tube tip projecting over the stomach confirming placement, few gaseous distended small bowel loops in the upper abdomen.  Lower abdomen excluded.   - Passing flatus, patient with large watery stools yesterday. - Patient was on erythromycin  and noted to unable to tolerate Reglan .  - Continue mobilization to stimulate  gastric intestinal motility with narcotic avoidance.  - Continue IV PPI.  -Repeat abdominal films. - Per GI.    2.  Hypotension - Resolved with hydration. - BP improved.   3.  Lactic acidosis -Resolved.  4.  Leukocytosis -Likely reactive, patient noted to have large GI losses on 8/16 and subsequently became hypotensive with elevated lactic acid levels. - Patient placed on IV fluids with a bolus and maintenance fluids with normalization of lactic acid on 11/19/2023. - Low suspicion for infection. - Status post IV fluids. - Continue to hold antibiotics at this time as no further leukocytosis, patient afebrile - Patient noted to have some orthostatic hypotension on 11/28/2023 while working with PT/OT and advised Ace wrap/TED hose while working with PT.  5.  Diarrhea -Patient noted to have reported semiformed stools. - GI pathogen panel negative. - Patient likely colonized with C. difficile as testing was negative for any active infection (antigen positive, toxin negative, PCR negative). -Patient currently with an ileus. - Follow.  6.  Acute urinary retention -Foley catheter placed 11/13/2023. - Patient successfully voiding trial.   - Follow.  7.  History of multiple myeloma -Per Dr. Von he was found somewhat patient symptoms thought to be due to chemotherapeutic medications and oncology reengaged to see if they had any further recommendations.   -Outpatient follow-up with oncology.  8.  Paroxysmal A-fib/NSVT -Not on any rate limiting medications consistently however noted to be on metoprolol  as needed prior to admission. -Patient noted yesterday evening to have a 23 beat run of nonsustained V. tach  and noted overnight to be going in and out of A-fib. -Patient with 2D echo done 07/27/2023 with a EF of 60 to 65%,NWMA, mild MVR. - Continue Lopressor  5 mg IV every 8 hours for rate control. -Currently in normal sinus rhythm and rate controlled. - Patient noted to be on metoprolol  at  home but on a as needed basis. - Was on Eliquis  which currently on hold as patient underwent EGD/small bowel enteroscopy, 11/30/2023 and patient currently with an ileus and n.p.o. -Heparin  drip held preprocedure and has been resumed postprocedure. - Keep potassium approximately 4, magnesium  approximately 2.  9.  Left finger discoloration -Resolved. - Noted to have had some discoloration of the left fingers on 8/18 however wife stated that was present on 8/17 and actually had improved on 8/18 and subsequently 8/20. - Concern was for cyanosis of his fingertips as they were also cold. - Patient noted to to have PICC line placed in the left upper extremity 8/18 however wife stated discoloration was present prior to that; there was also some extravasation from prior IV in the left arm as well but unclear what was transfusing as it happened prior to transfer to the ICU. - Left upper extremity arterial Dopplers were done with no obstruction of the left upper extremity with triphasic waves. - Patient seen in consultation by vascular surgery who felt no surgical intervention needed at this time. - Resolution of finger discoloration 8/19.  10.  Normocytic anemia -Patient with no significant overt bleeding. -Patient states some concern for blood via NG tube overnight. -NG tube with dark brown drainage noted. - Hemoglobin stable at 8.0. -Repeat H&H this afternoon. -If significant drop in H&H this afternoon and concern for ongoing bleeding via NG tube may need to hold heparin . - Follow H&H. - Transfusion threshold hemoglobin < 8.  11.  Hypernatremia -Likely secondary to dehydration. -Resolved with D5W. - Patient currently on TPN. - D5W discontinued.  12.  Hypokalemia/hypomagnesemia/hyponatremia -Hyponatremia resolved patient was hypernatremic, which resolved with D5W.  - Patient on TPN. - Potassium of 3.9, phosphorus of 3.3, magnesium  of 2.2.   - Will give KCl 10 mEq IV every hour x 4 runs to  keep potassium between 4 and 4.5 due to ileus.  - Repeat labs in AM.  13.  Abnormal LFTs -Acute hepatitis panel negative. - LFTs trending back down and seem to have resolved.. - Follow.  14.  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 - Status post IV Lasix  x 1. - IVF saline lock.  15.  Overweight -BMI of 22.43 kg/m. - Lifestyle modification - Outpatient follow-up with PCP.  16.  Severe protein calorie malnutrition - Patient started on trial of clears, 12/01/2023 which was subsequently discontinued and patient currently n.p.o as of 12/02/2023 due to persistent ileus with nausea and emesis and currently on NG tube..   - Continue TPN per pharmacy.     DVT prophylaxis: Heparin  GTT Code Status: Full Family Communication: Updated patient.  Updated wife at bedside.  Disposition: TBD  Status is: Inpatient Remains inpatient appropriate because: Severity of of illness   Consultants:  Gastroenterology: Dr. Kriss 11/19/2023 Vascular surgery: Dr.Robins 11/20/2023 General Surgery Interventional radiology  Procedures:  CT abdomen and pelvis 11/13/2023, 11/16/2023 Upper extremity arterial duplex, left 11/21/2023 Bilateral lower extremity Dopplers  11/26/2023 SBO protocol 11/17/2023 PICC line Small bowel enteroscopy/EGD: Per GI: Dr. Rosalie 11/30/2023  Antimicrobials:  Anti-infectives (From admission, onward)    Start     Dose/Rate Route Frequency Ordered Stop   11/27/23 1200  erythromycin  500 mg in sodium chloride  0.9 % 100 mL IVPB        500 mg 100 mL/hr over 60 Minutes Intravenous Every 6 hours 11/27/23 0933 11/29/23 0723   11/24/23 1400  erythromycin  500 mg in sodium chloride  0.9 % 100 mL IVPB  Status:  Discontinued        500  mg 100 mL/hr over 60 Minutes Intravenous Every 8 hours 11/24/23 1027 11/27/23 0933   11/23/23 2000  erythromycin  250 mg in sodium chloride  0.9 % 100 mL IVPB  Status:  Discontinued        250 mg 100 mL/hr over 60 Minutes Intravenous Every 8 hours 11/23/23 1744 11/24/23 1027   11/23/23 1200  erythromycin  (E-MYCIN ) tablet 250 mg  Status:  Discontinued        250 mg Oral 3 times daily before meals 11/23/23 1004 11/23/23 1744   11/18/23 1200  erythromycin  (E-MYCIN ) tablet 250 mg  Status:  Discontinued        250 mg Oral 2 times daily with meals 11/18/23 0720 11/19/23 0734   11/13/23 1000  acyclovir  (ZOVIRAX ) tablet 400 mg  Status:  Discontinued        400 mg Oral 2 times daily 11/13/23 0503 11/19/23 0739         Subjective: Patient sitting up in bed, NG tube in place with dark brownish drainage noted.  Patient denies any chest pain or shortness of breath.  No abdominal pain.  Passing some flatus.  Patient noted to have a large liquid stools yesterday per patient and wife.  No bowel movement yet today.  No nausea or emesis.  Per patient overnight RN was concerned patient may have had blood per NG tube.    Objective: Vitals:   12/04/23 0500 12/04/23 0600 12/04/23 0719 12/04/23 0800  BP: (!) 109/53 (!) 111/47    Pulse: 69 72    Resp: 15 15    Temp:   98.4 F (36.9 C) 98.9 F (37.2 C)  TempSrc:   Axillary Oral  SpO2: 98% 97%    Weight: 81.3 kg     Height:        Intake/Output Summary (Last 24 hours) at 12/04/2023 0956 Last data filed at 12/04/2023 0921 Gross per 24 hour  Intake 2153.97 ml  Output 3500 ml  Net -1346.03 ml   Filed Weights   11/28/23 0500 12/01/23 0436 12/04/23 0500  Weight: 81.4 kg 84.3 kg 81.3 kg    Examination:  General exam: NAD.  Respiratory system: Decreased bibasilar crackles.  No wheezing, no rhonchi,.  Movement.  Speaking in full sentences.  No use of accessory muscles of respiration.  Cardiovascular system: Regular rate rhythm no murmurs rubs or gallops.   No JVD.  Trace bilateral lower extremity edema.  Gastrointestinal system: Abdomen is soft, nontender, nondistended, positive bowel sounds.  No rebound.  No guarding.  Central nervous system: Alert and oriented. No focal neurological deficits. Extremities: Symmetric 5 x 5 power. Skin: No rashes, lesions or ulcers Psychiatry: Judgement and insight appear normal. Mood & affect appropriate.     Data Reviewed: I have personally reviewed following labs and imaging studies  CBC: Recent Labs  Lab 12/02/23 0500 12/03/23 0604 12/03/23 2343 12/04/23 0502 12/04/23 0838  WBC 6.3 6.4  5.2 5.3 6.0  HGB 8.0* 8.0* 7.9* 8.0* 8.0*  HCT 26.0* 26.6* 25.4* 24.9* 25.3*  MCV 99.2 119.8* 98.1 98.4 98.1  PLT 277 240 269 255 269    Basic Metabolic Panel: Recent Labs  Lab 11/29/23 0643 11/30/23 0500 12/01/23 0430 12/02/23 0500 12/02/23 1817 12/03/23 0911 12/04/23 0502  NA 149* 144 140 136 133* 134* 135  K 4.2 4.1 4.1 4.1 3.9 4.1 3.9  CL 114* 112* 109 106 104 104 103  CO2 27 23 23 22  21* 23 23  GLUCOSE 122* 120* 113* 91 98 111* 106*  BUN 33* 33* 29* 32* 30* 25* 22  CREATININE 0.56* 0.55* 0.44* 0.48* 0.45* 0.48* 0.44*  CALCIUM  8.5* 8.1* 7.9* 8.1* 8.0* 8.0* 8.3*  MG 2.5* 2.3 2.2 2.2 2.1 2.0 2.2  PHOS 3.5 3.4 2.9 2.8  --   --  3.3    GFR: Estimated Creatinine Clearance: 96 mL/min (A) (by C-G formula based on SCr of 0.44 mg/dL (L)).  Liver Function Tests: Recent Labs  Lab 11/30/23 0500 12/04/23 0502  AST 36 24  ALT 76* 40  ALKPHOS 142* 131*  BILITOT 0.5 0.4  PROT 5.5* 5.4*  ALBUMIN  2.5* 2.5*    CBG: Recent Labs  Lab 12/02/23 2323 12/03/23 0755 12/03/23 1510 12/03/23 2342 12/04/23 0718  GLUCAP 94 110* 94 98 114*     No results found for this or any previous visit (from the past 240 hours).       Radiology Studies: DG Abd Portable 1V Result Date: 12/03/2023 EXAM: 1 VIEW XRAY OF THE ABDOMEN 12/03/2023 06:14:12 AM COMPARISON: Previous exam. CLINICAL HISTORY: 01250 Ileus  (HCC) [id number]. ileus FINDINGS: BOWEL: Gastric Tube extends to the decompressed stomach. Several gas dilated mid abdominal small bowel loops. Colon appears decompressed. Fecalith in the rectum. SOFT TISSUES: No opaque urinary calculi. BONES: Mild bilateral Hip DJD. No acute osseous abnormality. VASCULATURE: Femoral arterial calcifications. Aortic atherosclerosis (ICD10-I70.0). IMPRESSION: 1. Ileus with several gas dilated mid abdominal small bowel loops and decompressed colon. Electronically signed by: Katheleen Faes MD 12/03/2023 09:11 AM EDT RP Workstation: [id number]   DG Abd 1 View Result Date: 12/03/2023 EXAM: 1 VIEW XRAY OF THE ABDOMEN 12/03/2023 09:02:00 AM COMPARISON: Earlier film of the same day. CLINICAL HISTORY: [id number] Encounter for nasogastric (NG) tube placement [id number]. Confirm NG tube placement. Encounter for nasogastric (NG) tube placement [id number]. Confirm NG tube placement. FINDINGS: BOWEL: A few gaseous distended small bowel loops in the upper abdomen. Lower abdomen excluded. SOFT TISSUES: No opaque urinary calculi. BONES: No acute osseous abnormality. LINES AND TUBES: Gastric tube  has been advanced further into the decompressed stomach. IMPRESSION: 1. NG tube tip projects over the stomach, confirming placement. 2. A few gaseous distended small bowel loops in the upper abdomen. Lower abdomen excluded. Electronically signed by: Katheleen Faes MD 12/03/2023 09:09 AM EDT RP Workstation: [id number]   DG Abd 1 View Result Date: 12/02/2023 EXAM: 1 VIEW XRAY OF THE ABDOMEN 12/02/2023 11:40:00 AM COMPARISON: 12/02/2023 CLINICAL HISTORY: Ileus (HCC) 01250. Confirm NGT placement FINDINGS: BOWEL: Multiple gas-filled dilated loops of small bowel, unchanged from previous exam. SOFT TISSUES: No opaque urinary calculi. Enteric tube in place with distal tip and side port terminating within the expected location of the stomach BONES: No acute osseous abnormality. IMPRESSION: 1. Multiple gas-filled dilated loops of  small bowel, unchanged from previous exam, consistent with ileus. 2. Enteric tube in place with distal tip and side port terminating within the expected location of the stomach. Electronically signed by: Waddell  Stroud MD 12/02/2023 11:46 AM EDT RP Workstation: [id number]        Scheduled Meds:  butamben -tetracaine -benzocaine   1 spray Topical Once   Chlorhexidine  Gluconate Cloth  6 each Topical Q2200   fluticasone   1 spray Each Nare Daily   insulin  aspart  0-9 Units Subcutaneous Q8H   metoprolol  tartrate  5 mg Intravenous Q8H   pantoprazole  (PROTONIX ) IV  40 mg Intravenous Q24H   sodium chloride  flush  10-40 mL Intracatheter Q12H   Continuous Infusions:  heparin  2,150 Units/hr (12/04/23 0155)   potassium chloride  10 mEq (12/04/23 0920)   TPN ADULT (ION) 100 mL/hr at 12/04/23 0155   TPN ADULT (ION)       LOS: 21 days    Time spent: 40 minutes    Toribio Hummer, MD [facility]   To contact the attending provider between 7A-7P or the covering provider during after hours 7P-7A, please log into the web site [email or web address] and access using universal [place] password for that web site. If you do not have the password, please call the hospital operator.  12/04/2023, 9:56 AM

## 2023-12-04 NOTE — Progress Notes (Signed)
 PHARMACY - ANTICOAGULATION CONSULT NOTE  Pharmacy Consult for heparin  Indication: atrial fibrillation  Allergies  Allergen Reactions   Shrimp [Shellfish Allergy] Anaphylaxis and Swelling    Swelling throat    Reglan  Bridges.Buerger ] Other (See Comments)    caused tardive dyskinesia    Patient Measurements: Height: 6' 4 (193 cm) Weight: 81.3 kg (179 lb 3.7 oz) IBW/kg (Calculated) : 86.8 HEPARIN  DW (KG): 94.6  Vital Signs: Temp: 98.9 F (37.2 C) (09/01 0800) Temp Source: Oral (09/01 0800) BP: 111/47 (09/01 0600) Pulse Rate: 72 (09/01 0600)  Labs: Recent Labs    12/02/23 1817 12/03/23 0604 12/03/23 0911 12/03/23 1623 12/03/23 2334 12/03/23 2343 12/04/23 0502 12/04/23 0838  HGB  --    < >  --   --   --  7.9* 8.0* 8.0*  HCT  --    < >  --   --   --  25.4* 24.9* 25.3*  PLT  --    < >  --   --   --  269 255 269  HEPARINUNFRC  --    < >  --  0.19* 0.38  --   --  0.41  CREATININE 0.45*  --  0.48*  --   --   --  0.44*  --    < > = values in this interval not displayed.    Estimated Creatinine Clearance: 96 mL/min (A) (by C-G formula based on SCr of 0.44 mg/dL (L)).   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  Patient reports he does not have symptoms as of 02/25/14.     Diarrhea 06/04/2023   Essential hypertension 06/04/2023   Fatty liver 06/04/2023   Flu 06/04/2023   Hyperlipidemia 06/04/2023   Ileus (HCC) 06/04/2023   Multiple myeloma (HCC)    Neuropathy    Paroxysmal atrial fibrillation (HCC) 07/04/2023   Pneumonia 06/04/2023     Assessment: 73 year old male with afib on Eliquis  PTA which is currently on hold. Patient recently admitted for bowel obstruction and discharged home after conservative treatment with NGT decompression.  He presented back with similar concerns, is currently on TPN.   Patient is currently on heparin  drip while apixaban  remains on hold.   Significant Events: -8/27: Several nosebleeds overnight. Op note today mentions mild NGT trauma. NG removed.  -8/28: UFH off at 06:00 for enteroscopy            UFH resumed at 17:03 -8/31 New bright red blood noted in NGT tubing (see RN note), heparin  level noted to be therapeutic  Today, 12/04/23 Heparin  level 0.41 - remains therapeutic with heparin  infusing at 2150 units/hr Hgb low but stable No additional complications noted at this time  Goal of Therapy:  Heparin  level 0.3-0.7 units/ml Monitor platelets by anticoagulation protocol: Yes   Plan:  Continue heparin  infusion at 2150 units/hr  CBC, heparin  level daily Monitor for signs of bleeding   Thank you for allowing pharmacy to be a part of this patient's care.  Stefano MARLA Bologna, PharmD, BCPS Clinical Pharmacist 12/04/2023 9:48 AM

## 2023-12-04 DEATH — deceased

## 2023-12-05 ENCOUNTER — Inpatient Hospital Stay (HOSPITAL_COMMUNITY)

## 2023-12-05 DIAGNOSIS — K567 Ileus, unspecified: Secondary | ICD-10-CM | POA: Diagnosis not present

## 2023-12-05 DIAGNOSIS — E785 Hyperlipidemia, unspecified: Secondary | ICD-10-CM | POA: Diagnosis not present

## 2023-12-05 DIAGNOSIS — E876 Hypokalemia: Secondary | ICD-10-CM | POA: Diagnosis not present

## 2023-12-05 DIAGNOSIS — C9000 Multiple myeloma not having achieved remission: Secondary | ICD-10-CM | POA: Diagnosis not present

## 2023-12-05 LAB — CBC
HCT: 22.9 % — ABNORMAL LOW (ref 39.0–52.0)
Hemoglobin: 7.1 g/dL — ABNORMAL LOW (ref 13.0–17.0)
MCH: 30.1 pg (ref 26.0–34.0)
MCHC: 31 g/dL (ref 30.0–36.0)
MCV: 97 fL (ref 80.0–100.0)
Platelets: 273 K/uL (ref 150–400)
RBC: 2.36 MIL/uL — ABNORMAL LOW (ref 4.22–5.81)
RDW: 19.6 % — ABNORMAL HIGH (ref 11.5–15.5)
WBC: 5.6 K/uL (ref 4.0–10.5)
nRBC: 0 % (ref 0.0–0.2)

## 2023-12-05 LAB — IRON AND TIBC
Iron: 17 ug/dL — ABNORMAL LOW (ref 45–182)
Saturation Ratios: 9 % — ABNORMAL LOW (ref 17.9–39.5)
TIBC: 185 ug/dL — ABNORMAL LOW (ref 250–450)
UIBC: 168 ug/dL

## 2023-12-05 LAB — LACTIC ACID, PLASMA: Lactic Acid, Venous: 0.7 mmol/L (ref 0.5–1.9)

## 2023-12-05 LAB — VITAMIN B12: Vitamin B-12: 984 pg/mL — ABNORMAL HIGH (ref 180–914)

## 2023-12-05 LAB — BASIC METABOLIC PANEL WITH GFR
Anion gap: 9 (ref 5–15)
BUN: 24 mg/dL — ABNORMAL HIGH (ref 8–23)
CO2: 24 mmol/L (ref 22–32)
Calcium: 8.1 mg/dL — ABNORMAL LOW (ref 8.9–10.3)
Chloride: 102 mmol/L (ref 98–111)
Creatinine, Ser: 0.53 mg/dL — ABNORMAL LOW (ref 0.61–1.24)
GFR, Estimated: >60 mL/min (ref >60)
Glucose, Bld: 104 mg/dL — ABNORMAL HIGH (ref 70–99)
Potassium: 4.2 mmol/L (ref 3.5–5.1)
Sodium: 134 mmol/L — ABNORMAL LOW (ref 135–145)

## 2023-12-05 LAB — FERRITIN: Ferritin: 196 ng/mL (ref 24–336)

## 2023-12-05 LAB — HEMOGLOBIN AND HEMATOCRIT, BLOOD
HCT: 30.5 % — ABNORMAL LOW (ref 39.0–52.0)
Hemoglobin: 9.8 g/dL — ABNORMAL LOW (ref 13.0–17.0)

## 2023-12-05 LAB — GLUCOSE, CAPILLARY
Glucose-Capillary: 103 mg/dL — ABNORMAL HIGH (ref 70–99)
Glucose-Capillary: 110 mg/dL — ABNORMAL HIGH (ref 70–99)
Glucose-Capillary: 120 mg/dL — ABNORMAL HIGH (ref 70–99)

## 2023-12-05 LAB — HEPARIN LEVEL (UNFRACTIONATED): Heparin Unfractionated: 0.3 [IU]/mL (ref 0.30–0.70)

## 2023-12-05 LAB — FOLATE: Folate: 8.2 ng/mL (ref >5.9)

## 2023-12-05 LAB — PREPARE RBC (CROSSMATCH)

## 2023-12-05 LAB — SURGICAL PATHOLOGY

## 2023-12-05 MED ORDER — DIPHENHYDRAMINE HCL 50 MG/ML IJ SOLN
25.0000 mg | Freq: Once | INTRAMUSCULAR | Status: AC
  Administered 2023-12-05: 25 mg via INTRAVENOUS
  Filled 2023-12-05: qty 1

## 2023-12-05 MED ORDER — SODIUM CHLORIDE 0.9 % IV SOLN: INTRAVENOUS | Status: DC

## 2023-12-05 MED ORDER — SODIUM CHLORIDE 0.9 % IV BOLUS
500.0000 mL | Freq: Once | INTRAVENOUS | Status: AC
  Administered 2023-12-05: 500 mL via INTRAVENOUS

## 2023-12-05 MED ORDER — IOHEXOL 9 MG/ML PO SOLN
ORAL | Status: AC
  Filled 2023-12-05: qty 1000

## 2023-12-05 MED ORDER — IOHEXOL 9 MG/ML PO SOLN
500.0000 mL | ORAL | Status: AC
  Administered 2023-12-05: 500 mL via ORAL

## 2023-12-05 MED ORDER — IOHEXOL 300 MG/ML  SOLN
100.0000 mL | Freq: Once | INTRAMUSCULAR | Status: AC | PRN
  Administered 2023-12-05: 100 mL via INTRAVENOUS

## 2023-12-05 MED ORDER — ACETAMINOPHEN 650 MG RE SUPP
650.0000 mg | Freq: Once | RECTAL | Status: AC
  Administered 2023-12-05: 650 mg via RECTAL
  Filled 2023-12-05: qty 1

## 2023-12-05 MED ORDER — BISACODYL 10 MG RE SUPP
10.0000 mg | Freq: Once | RECTAL | Status: AC
  Administered 2023-12-05: 10 mg via RECTAL
  Filled 2023-12-05: qty 1

## 2023-12-05 MED ORDER — SODIUM CHLORIDE 0.9 % IV BOLUS: 500.0000 mL | Freq: Once | INTRAVENOUS | Status: DC

## 2023-12-05 MED ORDER — TRAVASOL 10 % IV SOLN
INTRAVENOUS | Status: AC
  Filled 2023-12-05: qty 1320

## 2023-12-05 MED ORDER — SODIUM CHLORIDE 0.9% IV SOLUTION: Freq: Once | INTRAVENOUS | Status: AC

## 2023-12-05 MED ORDER — ALBUMIN HUMAN 5 % IV SOLN
12.5000 g | Freq: Once | INTRAVENOUS | Status: AC
  Administered 2023-12-05: 12.5 g via INTRAVENOUS
  Filled 2023-12-05: qty 250

## 2023-12-05 NOTE — Progress Notes (Signed)
 [facility] Gastroenterology Progress Note  David Mcgrath 73 y.o. 06/05/1950   Subjective: Resting in bed. Follows simple commands. NG tube in place.  Objective: Vital signs: Vitals:   12/05/23 1343 12/05/23 1400  BP: (!) 175/70 (!) 153/64  Pulse: 86 79  Resp: 20 20  Temp: 97.7 F (36.5 C)   SpO2: 98% 98%    Physical Exam: Gen: chronically ill-appearing, elderly, no acute distress  HEENT: anicteric sclera CV: RRR Chest: CTA B Abd: nontender, nondistension, decreased bowel sounds Ext: no edema  Lab Results: Recent Labs    12/03/23 0911 12/04/23 0502 12/05/23 0524  NA 134* 135 134*  K 4.1 3.9 4.2  CL 104 103 102  CO2 23 23 24   GLUCOSE 111* 106* 104*  BUN 25* 22 24*  CREATININE 0.48* 0.44* 0.53*  CALCIUM  8.0* 8.3* 8.1*  MG 2.0 2.2  --   PHOS  --  3.3  --    Recent Labs    12/04/23 0502  AST 24  ALT 40  ALKPHOS 131*  BILITOT 0.4  PROT 5.4*  ALBUMIN  2.5*   Recent Labs    12/04/23 0838 12/04/23 1511 12/05/23 0524  WBC 6.0  --  5.6  HGB 8.0* 8.6* 7.1*  HCT 25.3* 27.3* 22.9*  MCV 98.1  --  97.0  PLT 269  --  273      Assessment/Plan: Ileus slow to improve - gastric and small bowel biopsies benign and negative for amyloid. Recommend consulting IR for G-J tube for venting for palliative measures. Dilated loops of small and large bowel and gastric distention so would not recommend Neostigmine. Supportive care. Will follow.   David Mcgrath 12/05/2023, 2:03 PM  Questions please call [phone] ID: David Mcgrath David Mcgrath, male   DOB: May 14, 1950, 73 y.o.   MRN: [id number]

## 2023-12-05 NOTE — Plan of Care (Signed)
  Problem: Education: Goal: Knowledge of General Education information will improve Description: Including pain rating scale, medication(s)/side effects and non-pharmacologic comfort measures Outcome: Progressing   Problem: Health Behavior/Discharge Planning: Goal: Ability to manage health-related needs will improve Outcome: Progressing   Problem: Clinical Measurements: Goal: Ability to maintain clinical measurements within normal limits will improve Outcome: Progressing Goal: Will remain free from infection Outcome: Progressing Goal: Diagnostic test results will improve Outcome: Progressing Goal: Respiratory complications will improve Outcome: Progressing Goal: Cardiovascular complication will be avoided Outcome: Progressing   Problem: Nutrition: Goal: Adequate nutrition will be maintained Outcome: Progressing   Problem: Coping: Goal: Level of anxiety will decrease Outcome: Progressing   Problem: Elimination: Goal: Will not experience complications related to bowel motility Outcome: Progressing Goal: Will not experience complications related to urinary retention Outcome: Progressing   Problem: Pain Managment: Goal: General experience of comfort will improve and/or be controlled Outcome: Progressing   Problem: Safety: Goal: Ability to remain free from injury will improve Outcome: Progressing   Problem: Skin Integrity: Goal: Risk for impaired skin integrity will decrease Outcome: Progressing   Problem: Nutrition Goal: Patient maintains adequate hydration Outcome: Progressing Goal: Patient maintains weight Outcome: Progressing Goal: Patient will verbalize dietary restrictions Outcome: Progressing   Problem: Education: Goal: Ability to describe self-care measures that may prevent or decrease complications (Diabetes Survival Skills Education) will improve Outcome: Progressing Goal: Individualized Educational Video(s) Outcome: Progressing   Problem:  Coping: Goal: Ability to adjust to condition or change in health will improve Outcome: Progressing   Problem: Fluid Volume: Goal: Ability to maintain a balanced intake and output will improve Outcome: Progressing   Problem: Health Behavior/Discharge Planning: Goal: Ability to identify and utilize available resources and services will improve Outcome: Progressing Goal: Ability to manage health-related needs will improve Outcome: Progressing   Problem: Metabolic: Goal: Ability to maintain appropriate glucose levels will improve Outcome: Progressing   Problem: Nutritional: Goal: Maintenance of adequate nutrition will improve Outcome: Progressing Goal: Progress toward achieving an optimal weight will improve Outcome: Progressing   Problem: Skin Integrity: Goal: Risk for impaired skin integrity will decrease Outcome: Progressing   Problem: Tissue Perfusion: Goal: Adequacy of tissue perfusion will improve Outcome: Progressing   Problem: Activity: Goal: Risk for activity intolerance will decrease Outcome: Not Progressing

## 2023-12-05 NOTE — Progress Notes (Signed)
 OT Cancellation Note  Patient Details Name: Rosevelt Luu MRN: [id number] DOB: April 01, 1951   Cancelled Treatment:    Reason Eval/Treat Not Completed: Fatigue/lethargy limiting ability to participate;Medical issues which prohibited therapy  Patient asleep upon OT attempted visit and wife bedside reporting pt had a bad night with his blood pressure and is in between bags of blood he is receiving. OT will follow up tomorrow or as schedule allows.   Nima Bamburg OT/L Acute Rehabilitation Department  [phone]  12/05/2023, 3:02 PM

## 2023-12-05 NOTE — Progress Notes (Signed)
 PHARMACY - ANTICOAGULATION CONSULT NOTE  Pharmacy Consult for heparin  Indication: atrial fibrillation, VTE  Allergies  Allergen Reactions   Shrimp [Shellfish Allergy] Anaphylaxis and Swelling    Swelling throat    Reglan  [Metoclopramide ] Other (See Comments)    caused tardive dyskinesia    Patient Measurements: Height: 6' 4 (193 cm) Weight: 78.9 kg (173 lb 15.1 oz) IBW/kg (Calculated) : 86.8 HEPARIN  DW (KG): 94.6  Vital Signs: Temp: 99.4 F (37.4 C) (09/02 0400) Temp Source: Axillary (09/02 0400) BP: 116/65 (09/02 0600) Pulse Rate: 60 (09/02 0600)  Labs: Recent Labs    12/03/23 0911 12/03/23 1623 12/03/23 2334 12/03/23 2343 12/04/23 0502 12/04/23 0838 12/04/23 1511 12/05/23 0524  HGB  --   --   --    < > 8.0* 8.0* 8.6* 7.1*  HCT  --   --   --    < > 24.9* 25.3* 27.3* 22.9*  PLT  --   --   --    < > 255 269  --  273  HEPARINUNFRC  --    < > 0.38  --   --  0.41  --  0.30  CREATININE 0.48*  --   --   --  0.44*  --   --  0.53*   < > = values in this interval not displayed.    Estimated Creatinine Clearance: 93.1 mL/min (A) (by C-G formula based on SCr of 0.53 mg/dL (L)).   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  Patient reports he does not have symptoms as of 02/25/14.     Diarrhea 06/04/2023   Essential hypertension 06/04/2023   Fatty liver 06/04/2023   Flu 06/04/2023   Hyperlipidemia 06/04/2023   Ileus (HCC) 06/04/2023   Multiple myeloma (HCC)    Neuropathy    Paroxysmal atrial fibrillation (HCC) 07/04/2023   Pneumonia 06/04/2023     Assessment: 73 year old male with afib on Eliquis  PTA which is currently on hold. Patient recently admitted for bowel obstruction and discharged home after conservative treatment with NGT  decompression. He presented back with similar concerns, is currently on TPN. Pharmacy is consulted for heparin  drip while apixaban  remains on hold. Doppler with bilateral chronic DVT.   Significant Events: - 8/24 Doppler: + right common femoral chronic DVT, + left common femoral chronic DVT, + left age indeterminate superficial vein thrombosis - 8/27: Several nosebleeds overnight. Op note today mentions mild NGT trauma. NG removed.  - 8/28: UFH off at 06:00 for enteroscopy.  UFH resumed at 17:03 - 8/31 New bright red blood noted in NGT tubing (see RN note), heparin  level noted to be therapeutic  Today, 12/05/23 Heparin  level 0.3, remains therapeutic at low end of goal range on heparin  2150 units/hr Hgb low/decreased to 7.1 - MD ordered 2 units PRBC, Plt WNL.   No active bleeding or complications reported.  NG output dark yellow/orange color per RN.  No additional complications noted at this time  Goal of Therapy:  Heparin  level 0.3-0.7 units/ml Monitor platelets by anticoagulation protocol: Yes   Plan:  Continue heparin  infusion at 2150 units/hr  CBC, heparin  level daily Monitor for signs of bleeding   Thank you for allowing pharmacy to be a part of this patient's care.  Wanda Hasting PharmD,  BCPS [facility] main pharmacy [phone] 12/05/2023 7:31 AM

## 2023-12-05 NOTE — Progress Notes (Signed)
 Nutrition Follow-up  DOCUMENTATION CODES:   Severe malnutrition in context of acute illness/injury  INTERVENTION:  -  Continue goal TPN.              - TPN management per pharmacy.   - Daily weights while on TPN.  NUTRITION DIAGNOSIS:   Severe Malnutrition related to acute illness (recurrent small bowel obstruction) as evidenced by severe fat depletion, severe muscle depletion, energy intake < or equal to 50% for > or equal to 5 days, percent weight loss (22% in 1 month). *ongoing  GOAL:   Patient will meet greater than or equal to 90% of their needs *met with TPN  MONITOR:   Diet advancement, Labs, Weight trends  REASON FOR ASSESSMENT:   Consult New TPN/TNA  ASSESSMENT:   73 y.o. male with PMH significant for HTN, HLD, chronic anxiety/depression, polyneuropathy, multiple myeloma (diagnosed March 2025). Patient recently admitted both 7/12-7/18 and 7/31-8/3 with intractable nausea vomiting, recurrent small bowel obstruction and was treated conservatively with NG tube decompression and discharged home.  Presented back 8/10 with loose stools and cramping abdominal pain. Admitted for ileus vs partial SBO.  8/10 Admit 8/11 Soft diet 8/13 FLD 8/17 NPO; NGT placed 8/18 TPN initiated 8/28 s/p small bowel enteroscopy; NGT removed 8/29 repeat abdominal xray -> showing partial SBO vs ileus; started on clear liquid diet 8/30 NGT replaced after patient vomited; NPO   Patient again sleeping at time of visit. TPN continues to infuse at goal of 159mL/hr.  NGT was removed 8/28 but had to be replaced 8/30. Remains to suction.  GI note today started small bowel biopsies negative for amyloid. GI recommending consulting IR for GJ tube for venting for palliative measures.   Admit weight: 184# Current weight: 173# (today) I&O's: - since 8/19 *Pt was 185# as of 8/29, will continue to monitor weights   Medications reviewed and include: Protonix    Labs reviewed:  Na  134 Triglycerides 53 (as of 9/1)   Diet Order:   Diet Order     None       EDUCATION NEEDS:  Education needs have been addressed  Skin:  Skin Assessment: Skin Integrity Issues: Skin Integrity Issues:: Stage II Stage II: Sacrum  Last BM:  9/1 - type 7  Height:  Ht Readings from Last 1 Encounters:  11/18/23 6' 4 (1.93 m)   Weight:  Wt Readings from Last 1 Encounters:  12/05/23 78.9 kg   Ideal Body Weight:  91.8 kg  BMI:  Body mass index is 21.17 kg/m.  Estimated Nutritional Needs:  Kcal:  2400-2600 kcals Protein:  120-135 grams Fluid:  >/= 2.4L    Trude Ned RD, LDN Contact via Secure Chat.

## 2023-12-05 NOTE — TOC Progression Note (Signed)
 Transition of Care [facility]) - Progression Note    Patient Details  Name: David Mcgrath MRN: [id number] Date of Birth: 1951/02/14  Transition of Care [facility]) CM/SW Contact  Jon ONEIDA Anon, RN Phone Number: 12/05/2023, 3:03 PM  Clinical Narrative:    Pt is requiring continued medical workup, not medically stable for discharge. Currently NPO, with NG tube in place, needing TPN. Recommended for STR at a SNF at [place]. Will sent out SNF referrals closer to medical readiness. IP Care Management following.   Expected Discharge Plan: Skilled Nursing Facility Barriers to Discharge: Continued Medical Work up               Expected Discharge Plan and Services In-house Referral: NA Discharge Planning Services: CM Consult Post Acute Care Choice: Skilled Nursing Facility Living arrangements for the past 2 months: Single Family Home                 DME Arranged: N/A DME Agency: NA       HH Arranged: NA HH Agency: NA         Social Drivers of Health (SDOH) Interventions SDOH Screenings   Food Insecurity: No Food Insecurity (11/13/2023)  Housing: Low Risk  (11/13/2023)  Transportation Needs: No Transportation Needs (11/13/2023)  Utilities: Not At Risk (11/13/2023)  Depression (PHQ2-9): Low Risk  (11/09/2023)  Social Connections: Socially Integrated (11/13/2023)  Tobacco Use: Low Risk  (11/30/2023)    Readmission Risk Interventions    11/05/2023   10:31 AM 10/15/2023   11:45 AM  Readmission Risk Prevention Plan  Transportation Screening Complete Complete  PCP or Specialist Appt within 3-5 Days Complete Complete  HRI or Home Care Consult Complete Complete  Social Work Consult for Recovery Care Planning/Counseling Complete Complete  Palliative Care Screening Not Applicable Not Applicable  Medication Review Oceanographer) Complete Complete

## 2023-12-05 NOTE — Progress Notes (Signed)
 PROGRESS NOTE    David Mcgrath  FMW:[id number] DOB: Feb 25, 1951 DOA: 11/12/2023 PCP: [name], DO    Chief Complaint  Patient presents with   Weakness   Dizziness   Diarrhea         Brief Narrative:  73 y.o. male with medical history significant for hypertension, hyperlipidemia, paroxysmal A-fib on Eliquis , chronic anxiety/depression, polyneuropathy, multiple myeloma (diagnosed March 2025) received Bortezomib  infusion 11/09/2023. Patient was admitted here 11/02/2023 through 11/05/2023 with intractable nausea vomiting, recurrent small bowel obstruction.  He was treated conservatively with NG tube decompression and discharged home.     Last admission CT abdomen/pelvis with contrast with slight improvement in small bowel dilation when compared to prior exam but with mild transition point deep in the pelvis, no pancreatic ductal dilation, no inflammatory changes, stable free fluid within the abdomen.  General surgery was consulted and patient underwent diagnostic laparoscopy by Dr. [name] on 11/03/2023 findings of intermittently dilated small bowel with erythema of the serosa consistent with inflammation/edema, intermittent injection of the small bowel mesentery, small bowel with tendency to migrate to the left upper quadrant with long/mobile small bowel mesentery with suspicion/potential for intermittent volvulized small bowel contributing to his symptoms and findings on imaging.  Diet was slowly advanced with toleration.     This admission CT abdomen shows increasing small bowel dilatation and fecalization of bowel contents involving the jejunum. No definitive transition point is identified at this time but caliber change is noted in the right mid abdomen at the junction of the jejunum and ileum. The more distal ileum is unremarkable. Stable ground-glass opacities in the lung bases similar to that seen on prior exams.    **Interim History Has had a persistent ileus and continues  to  have gastric distention. Unfortunately lost his NG tube on 8/21 but had to be replaced by IR.  Now connected back to [facility].  Underwent a upper GI series with small bowel follow-through and this shows contrast in the stomach with delayed gastric emptying.  Subsequently there was contrast in the rectum suggesting no complete obstruction.  GI recommending continuing erythromycin  for now and repeating abdominal x-rays and considering EGD early next week (Monday vs Tuesday) to rule out pyloric stricture and recommending ambulation.   8/25: Patient was admitted on 8/10 and I assumed care today on 8/25.  Please review prior notes.  Most of the stuff carried on for continued of care.  Further management as below.   Assessment & Plan:   Principal Problem:   Ileus (HCC) Active Problems:   Hypokalemia   Paroxysmal atrial fibrillation (HCC)   Multiple myeloma (HCC)   Essential hypertension   Depression   Hyperlipidemia   Pressure injury of skin   Protein-calorie malnutrition, severe   Hypernatremia   Swelling of lower extremity   Transaminitis   Acute urinary retention   NSVT (nonsustained ventricular tachycardia) (HCC)  #1 ileus versus partial small bowel obstruction -Patient noted to have presented with nausea vomiting diarrhea. - CT abdomen and pelvis done concerning ileus versus partial SBO. - Patient noted to have been treated conservatively. - NG tube not initially placed. - Patient initially was improving earlier on in the hospitalization and had further recurrence of nausea and vomiting. - Repeat CT abdomen and pelvis done with persistent small bowel dilatation with prominent jejunum. - Patient started a trial with erythromycin  on 11/18/2023 but was not successful as patient noted to began having large biliary emesis on 11/18/2023. - Due to allergy to  Reglan  patient was placed on IV erythromycin  and dose increased to 500 mg 3 times daily. - NG tube initially placed 11/18/2023 and  subsequently had to be replaced 11/23/2023. - Patient seen in consultation by general surgery and did not feel patient needed any repeat laparoscopy as deferred would not be beneficial. - Due to patient's prolonged hospitalization minimal nutrition patient started on TPN after PICC line was placed on 11/20/2023. - Patient subsequently underwent UGI with SBFT per surgery to assess for transit and patency which showed contrast present in the stomach with delayed gastric emptying. - Narcotics are to be minimized. - Patient seen in consultation by GI review might take several days to weeks for issue to resolve. - KUB done 11/24/2023 showed minimal bowel contrast material demonstrated.  There appears to be contrast material in the rectum suggesting no evidence of complete obstruction.  Gaseous distention of small bowel.  Gas-filled colon most likely ileus. - Repeat abdominal films done with similar diffuse gaseous distention of the bowel and abdomen and pelvis compared to prior films. - GI reconsulted and following the patient status post small bowel enteroscopy/EGD 11/30/2023 with small hiatal hernia, caustic gastritis with biopsies taken.  - NG tube discontinued postprocedure and patient initially was doing well however the morning of 12/02/2023 patient with a large bilious emesis while being examined by GI and NG tube placed and kept to suction with 1.3 L of bilious fluid was immediately drained once NG tube was placed. -Patient subsequently placed back on bowel rest. -Abdominal x-ray from 12/01/2023 consistent with ileus versus partial SBO.   - Repeat abdominal films on, 12/03/2023, with NG tube tip projecting over the stomach confirming placement, few gaseous distended small bowel loops in the upper abdomen.  Lower abdomen excluded.   - Passing flatus, patient with large watery stools yesterday. - Patient was on erythromycin  which pharmacy has ran out of, and noted to unable to tolerate Reglan .  - Continue  mobilization to stimulate gastric intestinal motility with narcotic avoidance.  - Continue IV PPI.  -Repeat abdominal films pending this morning. - Per GI. ??  Neostigmine however will defer to GI  2.  Hypotension - Resolved with hydration early on in the hospitalization. -Patient noted with a bout of hypotension overnight requiring 1 L bolus and IV albumin . -BP improved. -Hemoglobin at 7.1 this morning. -Transfused 2 units PRBCs. -DC maintenance IV fluids.  3.  Lactic acidosis -Resolved.  4.  Leukocytosis -Likely reactive, patient noted to have large GI losses on 8/16 and subsequently became hypotensive with elevated lactic acid levels. - Patient placed on IV fluids with a bolus and maintenance fluids with normalization of lactic acid on 11/19/2023. - Low suspicion for infection. - Status post IV fluids. - Continue to hold antibiotics at this time as no further leukocytosis, patient afebrile - Patient noted to have some orthostatic hypotension on 11/28/2023 while working with PT/OT and advised Ace wrap/TED hose while working with PT.  5.  Diarrhea -Patient noted to have reported semiformed stools. - GI pathogen panel negative. - Patient likely colonized with C. difficile as testing was negative for any active infection (antigen positive, toxin negative, PCR negative). -Patient currently with an ileus. - Follow.  6.  Acute urinary retention -Foley catheter placed 11/13/2023. - Patient with successful voiding trial.   - Urine output of 1.225 L over the past 24 hours. - Follow.  7.  History of multiple myeloma -Per Dr. Von he was found somewhat patient symptoms thought to be  due to chemotherapeutic medications and oncology reengaged to see if they had any further recommendations.   -Outpatient follow-up with oncology.  8.  Paroxysmal A-fib/NSVT -Not on any rate limiting medications consistently however noted to be on metoprolol  as needed prior to admission. -Patient noted  yesterday evening to have a 23 beat run of nonsustained V. tach and noted overnight to be going in and out of A-fib. -Patient with 2D echo done 07/27/2023 with a EF of 60 to 65%,NWMA, mild MVR. - Continue Lopressor  5 mg IV every 8 hours for rate control. -Currently in normal sinus rhythm and rate controlled. - Patient noted to be on metoprolol  at home but on a as needed basis. - Was on Eliquis  which currently on hold as patient underwent EGD/small bowel enteroscopy, 11/30/2023 and patient currently with an ileus and n.p.o. -Heparin  drip held preprocedure and has been resumed postprocedure. - Keep potassium approximately 4, magnesium  approximately 2.  9.  Left finger discoloration -Resolved. - Noted to have had some discoloration of the left fingers on 8/18 however wife stated that was present on 8/17 and actually had improved on 8/18 and subsequently 8/20. - Concern was for cyanosis of his fingertips as they were also cold. - Patient noted to to have PICC line placed in the left upper extremity 8/18 however wife stated discoloration was present prior to that; there was also some extravasation from prior IV in the left arm as well but unclear what was transfusing as it happened prior to transfer to the ICU. - Left upper extremity arterial Dopplers were done with no obstruction of the left upper extremity with triphasic waves. - Patient seen in consultation by vascular surgery who felt no surgical intervention needed at this time. - Resolution of finger discoloration 8/19.  10.  Normocytic anemia -Patient with no significant overt bleeding. -Patient states some concern for blood via NG tube overnight. -NG tube with dark brown drainage noted. - Hemoglobin currently at 7.1 this morning.   - Patient noted to have a bout of hypotension overnight that improved with fluid bolus and albumin .   -Due to bout of hypotension overnight, drop in hemoglobin will transfuse 2 units PRBCs. Follow H&H.  11.   Hypernatremia -Likely secondary to dehydration. -Resolved with D5W. - Patient currently on TPN. - D5W discontinued.  12.  Hypokalemia/hypomagnesemia/hyponatremia -Hyponatremia resolved patient was hypernatremic, which resolved with D5W.  - Patient on TPN. - Potassium of 4.2, phosphorus of 3.3, magnesium  of 2.2.  - Will give KCl 10 mEq IV every hour x 4 runs to keep potassium approximately 4.5. - Repeat labs in the AM.   13.  Abnormal LFTs -Acute hepatitis panel negative. - LFTs trending back down and seem to have resolved.. - Follow.  14.  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 - Status post IV Lasix  x 1. - IVF saline lock.  15.  Overweight -BMI of 22.43 kg/m. - Lifestyle modification - Outpatient follow-up with PCP.  16.  Severe protein calorie malnutrition - Patient started on trial of clears, 12/01/2023 which was subsequently discontinued and patient currently n.p.o as of 12/02/2023 due to persistent ileus with nausea and emesis and currently with NG tube..   - Continue TPN per pharmacy.     DVT prophylaxis: Heparin  GTT Code Status: Full  Family Communication: Updated patient.  Updated wife at bedside.  Disposition: TBD  Status is: Inpatient Remains inpatient appropriate because: Severity of of illness   Consultants:  Gastroenterology: Dr. Kriss 11/19/2023 Vascular surgery: Dr.Robins 11/20/2023 General Surgery Interventional radiology  Procedures:  CT abdomen and pelvis 11/13/2023, 11/16/2023 Upper extremity arterial duplex, left 11/21/2023 Bilateral lower extremity Dopplers 11/26/2023 SBO protocol 11/17/2023 PICC line Small bowel enteroscopy/EGD: Per GI: Dr. Rosalie 11/30/2023 Transfused 2 units PRBCs  pending 12/05/2023  Antimicrobials:  Anti-infectives (From admission, onward)    Start     Dose/Rate Route Frequency Ordered Stop   11/27/23 1200  erythromycin  500 mg in sodium chloride  0.9 % 100 mL IVPB        500 mg 100 mL/hr over 60 Minutes Intravenous Every 6 hours 11/27/23 0933 11/29/23 0723   11/24/23 1400  erythromycin  500 mg in sodium chloride  0.9 % 100 mL IVPB  Status:  Discontinued        500 mg 100 mL/hr over 60 Minutes Intravenous Every 8 hours 11/24/23 1027 11/27/23 0933   11/23/23 2000  erythromycin  250 mg in sodium chloride  0.9 % 100 mL IVPB  Status:  Discontinued        250 mg 100 mL/hr over 60 Minutes Intravenous Every 8 hours 11/23/23 1744 11/24/23 1027   11/23/23 1200  erythromycin  (E-MYCIN ) tablet 250 mg  Status:  Discontinued        250 mg Oral 3 times daily before meals 11/23/23 1004 11/23/23 1744   11/18/23 1200  erythromycin  (E-MYCIN ) tablet 250 mg  Status:  Discontinued        250 mg Oral 2 times daily with meals 11/18/23 0720 11/19/23 0734   11/13/23 1000  acyclovir  (ZOVIRAX ) tablet 400 mg  Status:  Discontinued        400 mg Oral 2 times daily 11/13/23 0503 11/19/23 0739         Subjective: Patient lying in bed, NG tube in place with bilious drainage.   Patient stated had a rough night last night.   Events overnight noted of hypotension, patient requiring 1 L bolus of IV fluids as well as IV albumin , BP improved and currently stable.  Patient passing flatus, no bowel movement today.  Noted to have liquid stools yesterday.  No nausea or emesis.  Denies any shortness of breath or chest pain.  Wife at bedside.    Objective: Vitals:   12/05/23 0600 12/05/23 0800 12/05/23 0810 12/05/23 0900  BP: 116/65 (!) 162/70  (!) 167/65  Pulse: 60 86 86 83  Resp: 18 19 (!) 24 (!) 21  Temp:  99 F (37.2 C)    TempSrc:  Axillary    SpO2: 98% 97% 97% (!) 88%  Weight:      Height:        Intake/Output Summary (Last 24 hours) at 12/05/2023 1016 Last data filed at  12/05/2023 0915 Gross per 24 hour  Intake 4356.52 ml  Output 2075 ml  Net 2281.52 ml   Filed Weights   12/01/23 0436 12/04/23 0500 12/05/23 0500  Weight: 84.3 kg 81.3 kg 78.9 kg    Examination:  General exam: NAD.  NG tube in place with bilious drainage. Respiratory system: Decreased bibasilar crackles.  No wheezing, no rhonchi.  Fair air movement.  Speaking in full sentences.  No use of accessory muscles of respiration.  Cardiovascular system: RRR no murmurs rubs or gallops.  No JVD.  No pitting lower extremity edema. Gastrointestinal system: Abdomen is soft, nontender, nondistended,  positive bowel sounds.  No rebound.  No guarding.  Central nervous system: Alert and oriented. No focal neurological deficits. Extremities: Symmetric 5 x 5 power. Skin: No rashes, lesions or ulcers Psychiatry: Judgement and insight appear normal. Mood & affect appropriate.     Data Reviewed: I have personally reviewed following labs and imaging studies  CBC: Recent Labs  Lab 12/03/23 0604 12/03/23 2343 12/04/23 0502 12/04/23 0838 12/04/23 1511 12/05/23 0524  WBC 6.4 5.2 5.3 6.0  --  5.6  HGB 8.0* 7.9* 8.0* 8.0* 8.6* 7.1*  HCT 26.6* 25.4* 24.9* 25.3* 27.3* 22.9*  MCV 119.8* 98.1 98.4 98.1  --  97.0  PLT 240 269 255 269  --  273    Basic Metabolic Panel: Recent Labs  Lab 11/29/23 0643 11/30/23 0500 12/01/23 0430 12/02/23 0500 12/02/23 1817 12/03/23 0911 12/04/23 0502 12/05/23 0524  NA 149* 144 140 136 133* 134* 135 134*  K 4.2 4.1 4.1 4.1 3.9 4.1 3.9 4.2  CL 114* 112* 109 106 104 104 103 102  CO2 27 23 23 22  21* 23 23 24   GLUCOSE 122* 120* 113* 91 98 111* 106* 104*  BUN 33* 33* 29* 32* 30* 25* 22 24*  CREATININE 0.56* 0.55* 0.44* 0.48* 0.45* 0.48* 0.44* 0.53*  CALCIUM  8.5* 8.1* 7.9* 8.1* 8.0* 8.0* 8.3* 8.1*  MG 2.5* 2.3 2.2 2.2 2.1 2.0 2.2  --   PHOS 3.5 3.4 2.9 2.8  --   --  3.3  --     GFR: Estimated Creatinine Clearance: 93.1 mL/min (A) (by C-G formula based on SCr of  0.53 mg/dL (L)).  Liver Function Tests: Recent Labs  Lab 11/30/23 0500 12/04/23 0502  AST 36 24  ALT 76* 40  ALKPHOS 142* 131*  BILITOT 0.5 0.4  PROT 5.5* 5.4*  ALBUMIN  2.5* 2.5*    CBG: Recent Labs  Lab 12/03/23 2342 12/04/23 0718 12/04/23 1540 12/04/23 2326 12/05/23 0808  GLUCAP 98 114* 76 115* 110*     Recent Results (from the past 240 hours)  Culture, blood (Routine X 2) w Reflex to ID Panel     Status: None (Preliminary result)   Collection Time: 12/05/23  5:24 AM   Specimen: BLOOD RIGHT HAND  Result Value Ref Range Status   Specimen Description   Final    BLOOD RIGHT HAND Performed at [facility], [address] [place], [place] [address]    Special Requests   Final    BOTTLES DRAWN AEROBIC AND ANAEROBIC Blood Culture results may not be optimal due to an inadequate volume of blood received in culture bottles Performed at [facility], [address], [place], [place] [address]    Culture PENDING  Incomplete   Report Status PENDING  Incomplete         Radiology Studies: DG Abd Portable 1V Result Date: 12/04/2023 CLINICAL DATA:  [id number] Ileus (HCC) [id number] EXAM: PORTABLE ABDOMEN - 1 VIEW COMPARISON:  December 03, 2023 FINDINGS: Diffuse gaseous distension of loops of small and large bowel. Enteric tube tip and side port project over the stomach. Degree of dilation is similar compared to prior with the most prominent loops of bowel in the upper abdomen. Air is visualized in the rectum. IMPRESSION: Similar degree of gaseous distension of loops of small and large bowel suggestive of ileus. Electronically Signed   By: Corean Salter M.D.   On: 12/04/2023 10:39        Scheduled Meds:  sodium chloride    Intravenous  Once   acetaminophen   650 mg Rectal Once   Chlorhexidine  Gluconate Cloth  6 each Topical Q2200   diphenhydrAMINE   25 mg Intravenous Once   fluticasone   1 spray Each Nare Daily   insulin  aspart  0-9 Units Subcutaneous Q8H    metoprolol  tartrate  5 mg Intravenous Q8H   pantoprazole  (PROTONIX ) IV  40 mg Intravenous Q12H   sodium chloride  flush  10-40 mL Intracatheter Q12H   Continuous Infusions:  sodium chloride  50 mL/hr at 12/05/23 0915   heparin  2,150 Units/hr (12/05/23 0915)   TPN ADULT (ION) 100 mL/hr at 12/05/23 0915   TPN ADULT (ION)       LOS: 22 days    Time spent: 40 minutes    Toribio Hummer, MD [facility]   To contact the attending provider between 7A-7P or the covering provider during after hours 7P-7A, please log into the web site [email or web address] and access using universal [place] password for that web site. If you do not have the password, please call the hospital operator.  12/05/2023, 10:16 AM

## 2023-12-05 NOTE — Progress Notes (Signed)
 PHARMACY - TOTAL PARENTERAL NUTRITION CONSULT NOTE   Indication: Bowel Obstruction  Patient Measurements: Height: 6' 4 (193 cm) Weight: 78.9 kg (173 lb 15.1 oz) IBW/kg (Calculated) : 86.8 TPN AdjBW (KG): 94.6 Body mass index is 21.17 kg/m.  Assessment:  Pharmacy is consulted to start TPN on 73 yo male diagnosed with bowel obstruction. This admission CT abdomen shows increasing small bowel dilatation and fecalization of bowel contents involving the jejunum. No definitive transition point is identified at this time but caliber change is noted in the right mid abdomen at the junction of the jejunum and ileum. The more distal ileum is unremarkable. Some suspicion that bowel obstruction may be related to bortezomib  treatment pt receiving for multiple myeloma.   Glucose / Insulin : no hx of DM. Goal BG <150 - CBG range: 76-115 (no SSI used in last 24hrs) Electrolytes: Na low at 134, others WNL including CorrCa (9.3) Renal: SCr < 1, BUN stable Hepatic: LFTs WNL, alb 2.5; TG 53 Intake / Output; MIVF:  - Output: NG output decreased to 1150, urine 1225 mL  - Stool:  x2 occurrence GI Imaging: -Admitted from 10/14/23-10/20/23 for symptoms related to SBO.  CT: Transition point suspected in the right hemi-abdomen. Received non-operative management.  -Admitted 11/02/23 - 11/05/2023 for small bowel dilatation/concern for small bowel obstruction. CT imaging showed persistent small bowel dilatation with mild transition point noted deep in pelvis. He went to OR 11/03/23, findings were suspicious for intermittent volvulus of small bowel to explain his symptoms and CT imaging.  -8/10  CT abdomen shows increasing small bowel dilatation and fecalization of bowel contents involving the jejunum. No definitive transition point is identified at this time but caliber change is noted in the right mid abdomen at the junction of the jejunum and ileum.  GI Surgeries / Procedures:  - 8/1 diagnostic laparoscopy -8/28: EGD,  enteroscopy - small hiatal hernia, gastritis, duodenum/jejunum appeared normal; several biopsies taken  Central access:  PICC 8/18   Nutritional Goals: Goal TPN rate is 100  mL/hr (provides 132 g of protein and 2438 kcals per day)  RD Assessment:  Estimated Needs Total Energy Estimated Needs: 2400-2600 kcals Total Protein Estimated Needs: 120-135 grams Total Fluid Estimated Needs: >/= 2.4L  Current Nutrition:  NPO and TPN  Plan:  Continue TPN at 100 mL/hr Electrolytes in TPN:  Na 75 mEq/L  K 40 mEq/L Ca 2.5 mEq/L Mg 5 mEq/L Phos 15 mmol/L  Cl:Ac 1:2 Add standard MVI and trace elements to TPN Continue q8h Sensitive SSI and adjust as needed Monitor TPN labs on Mon/Thurs    Wanda Hasting PharmD, BCPS [facility] main pharmacy [phone] 12/05/2023 7:17 AM

## 2023-12-05 NOTE — Progress Notes (Signed)
 [facility] NP notified about low BP, patient mentation changed. Patient placed in trendelenburg position, with recovery of BP and bettering mentation. See MAR for new orders

## 2023-12-05 NOTE — Plan of Care (Signed)

## 2023-12-06 ENCOUNTER — Inpatient Hospital Stay (HOSPITAL_COMMUNITY)

## 2023-12-06 DIAGNOSIS — K567 Ileus, unspecified: Secondary | ICD-10-CM | POA: Diagnosis not present

## 2023-12-06 LAB — BASIC METABOLIC PANEL WITH GFR
Anion gap: 10 (ref 5–15)
BUN: 21 mg/dL (ref 8–23)
CO2: 23 mmol/L (ref 22–32)
Calcium: 8.5 mg/dL — ABNORMAL LOW (ref 8.9–10.3)
Chloride: 103 mmol/L (ref 98–111)
Creatinine, Ser: 0.48 mg/dL — ABNORMAL LOW (ref 0.61–1.24)
GFR, Estimated: >60 mL/min (ref >60)
Glucose, Bld: 115 mg/dL — ABNORMAL HIGH (ref 70–99)
Potassium: 3.9 mmol/L (ref 3.5–5.1)
Sodium: 136 mmol/L (ref 135–145)

## 2023-12-06 LAB — TYPE AND SCREEN
ABO/RH(D): O POS
Antibody Screen: NEGATIVE
Unit division: 0
Unit division: 0

## 2023-12-06 LAB — BPAM RBC
Blood Product Expiration Date: 202510022359
Blood Product Expiration Date: 202510022359
ISSUE DATE / TIME: 202509021058
ISSUE DATE / TIME: 202509021502
Unit Type and Rh: 5100
Unit Type and Rh: 5100

## 2023-12-06 LAB — CBC
HCT: 32.9 % — ABNORMAL LOW (ref 39.0–52.0)
Hemoglobin: 10.5 g/dL — ABNORMAL LOW (ref 13.0–17.0)
MCH: 30.7 pg (ref 26.0–34.0)
MCHC: 31.9 g/dL (ref 30.0–36.0)
MCV: 96.2 fL (ref 80.0–100.0)
Platelets: 330 K/uL (ref 150–400)
RBC: 3.42 MIL/uL — ABNORMAL LOW (ref 4.22–5.81)
RDW: 18.4 % — ABNORMAL HIGH (ref 11.5–15.5)
WBC: 8.2 K/uL (ref 4.0–10.5)
nRBC: 0 % (ref 0.0–0.2)

## 2023-12-06 LAB — MAGNESIUM: Magnesium: 2.1 mg/dL (ref 1.7–2.4)

## 2023-12-06 LAB — GLUCOSE, CAPILLARY
Glucose-Capillary: 108 mg/dL — ABNORMAL HIGH (ref 70–99)
Glucose-Capillary: 84 mg/dL (ref 70–99)
Glucose-Capillary: 106 mg/dL — ABNORMAL HIGH (ref 70–99)

## 2023-12-06 LAB — HEPARIN LEVEL (UNFRACTIONATED): Heparin Unfractionated: 0.37 [IU]/mL (ref 0.30–0.70)

## 2023-12-06 MED ORDER — POTASSIUM CHLORIDE 10 MEQ/100ML IV SOLN
10.0000 meq | INTRAVENOUS | Status: AC
  Administered 2023-12-06 (×4): 10 meq via INTRAVENOUS
  Filled 2023-12-06 (×4): qty 100

## 2023-12-06 MED ORDER — TRAVASOL 10 % IV SOLN
INTRAVENOUS | Status: AC
  Filled 2023-12-06: qty 1320

## 2023-12-06 NOTE — Progress Notes (Signed)
 Physical Therapy Treatment Patient Details Name: David Mcgrath MRN: [id number] DOB: 1951/03/09 Today's Date: 12/06/2023   History of Present Illness Pt is 73 yo male who presented on 11/12/23 with nausea, vomiting, diarrhea, hypotension.  CT raised concern for ileus vs partial SBO.  Pt recently admitted 7/31-8/3 for SBO, pt underwent diagnostic laparoscopy on 8/1.  This admission pt with persistent ileus, treated with NG tube 8/16 and replaced 8/21.  Surgery consulted and did not feel repeat laparoscopy beneficial.  TPN started 8/18. GI reconsulted for possible EGD but continuing erythromycin  for now. Pt also with hypotension, urinary retention, PAF, hyponatremia/hypokalemia/hypomagnesemia/hypoglycemia.  Pt also with LE edema - findings consistent with chronic DVT.  Pt with hx including but not limited to  hypertension, hyperlipidemia, paroxysmal A-fib on Eliquis , chronic anxiety/depression, polyneuropathy, multiple myeloma (diagnosed March 2025) received Bortezomib  infusion 11/09/2023.    PT Comments   Patient in good spirits and ready to mobilize to recliner. Patient  with improved strength to assist self to sitting onto bed edge, then  stood  at RW and stepped to recliner with Rw, +2 mod assist for safety.   Patient continues to be very deconditioned. Patient will benefit from intensive inpatient follow-up therapy, >3 hours/day    If plan is discharge home, recommend the following: Assistance with cooking/housework;Help with stairs or ramp for entrance;Assist for transportation;Two people to help with walking and/or transfers;A lot of help with bathing/dressing/bathroom   Can travel by private vehicle     Yes (exercises)  Equipment Recommendations    none   Recommendations for Other Services       Precautions / Restrictions Precautions Precautions: Fall Precaution/Restrictions Comments: NG drain, watch BP, TEDS Restrictions Weight Bearing Restrictions Per Provider Order: No      Mobility  Bed Mobility Overal bed mobility: Needs Assistance Bed Mobility: Rolling, Sidelying to Sit Rolling: Mod assist Sidelying to sit: Mod assist       General bed mobility comments: multimodal cues with increased time but less assist this session    Transfers   Equipment used: Rolling walker (2 wheels) Transfers: Sit to/from Stand, Bed to chair/wheelchair/BSC Sit to Stand: Mod assist, +2 physical assistance, +2 safety/equipment, From elevated surface Stand pivot transfers: Max assist, +2 physical assistance, +2 safety/equipment, From elevated surface Step pivot transfers: Mod assist, +2 physical assistance, +2 safety/equipment, From elevated surface       General transfer comment: cues for hand placement and to slow deceleration STS    Ambulation/Gait                   Stairs             Wheelchair Mobility     Tilt Bed    Modified Rankin (Stroke Patients Only)       Balance Overall balance assessment: Needs assistance Sitting-balance support: Bilateral upper extremity supported, Feet supported Sitting balance-Leahy Scale: Fair Sitting balance - Comments: head forward posture   Standing balance support: During functional activity, Reliant on assistive device for balance, Bilateral upper extremity supported Standing balance-Leahy Scale: Poor Standing balance comment: stood at Rw , more steadiness                            Communication Communication Communication: No apparent difficulties Factors Affecting Communication: Difficulty expressing self  Cognition Arousal: Alert Behavior During Therapy: Anxious   PT - Cognitive impairments: Difficult to assess Difficult to assess due to: Impaired communication  PT - Cognition Comments: difficulty expressing self,  patientasking therapist about something but not able to complete thought. patient very motivated to  mobilize this visit. Following  commands: Intact Following commands impaired: Follows one step commands with increased time    Cueing Cueing Techniques: Verbal cues, Tactile cues  Exercises      General Comments General comments (skin integrity, edema, etc.): post tx BP seated in chair with thigh high TEDs in place 184/79, no SOB this session      Pertinent Vitals/Pain Pain Assessment Faces Pain Scale: Hurts a little bit Pain Location: nasal region around tube Pain Descriptors / Indicators: Grimacing Pain Intervention(s): Monitored during session    Home Living                          Prior Function            PT Goals (current goals can now be found in the care plan section) Progress towards PT goals: Progressing toward goals    Frequency    Min 2X/week      PT Plan      Co-evaluation PT/OT/SLP Co-Evaluation/Treatment: Yes Reason for Co-Treatment: Complexity of the patient's impairments (multi-system involvement) PT goals addressed during session: Mobility/safety with mobility;Balance;Proper use of DME OT goals addressed during session: ADL's and self-care;Proper use of Adaptive equipment and DME      AM-PAC PT 6 Clicks Mobility   Outcome Measure  Help needed turning from your back to your side while in a flat bed without using bedrails?: A Lot Help needed moving from lying on your back to sitting on the side of a flat bed without using bedrails?: A Lot Help needed moving to and from a bed to a chair (including a wheelchair)?: A Lot Help needed standing up from a chair using your arms (e.g., wheelchair or bedside chair)?: A Lot Help needed to walk in hospital room?: Total Help needed climbing 3-5 steps with a railing? : Total 6 Click Score: 10    End of Session Equipment Utilized During Treatment: Gait belt Activity Tolerance: Patient tolerated treatment well Patient left: in chair;with call bell/phone within reach;with chair alarm set;with family/visitor present Nurse  Communication: Mobility status;Other (comment) PT Visit Diagnosis: Unsteadiness on feet (R26.81);Other abnormalities of gait and mobility (R26.89);Muscle weakness (generalized) (M62.81)     Time: 1005-1030 PT Time Calculation (min) (ACUTE ONLY): 25 min  Charges:    $Therapeutic Activity: 8-22 mins PT General Charges $$ ACUTE PT VISIT: 1 Visit                     {[facility] PT Acute Rehabilitation Services Office [phone]   David Mcgrath 12/06/2023, 3:04 PM

## 2023-12-06 NOTE — Progress Notes (Addendum)
 Patient ID: David Mcgrath, male   DOB: 1950/08/09, 73 y.o.   MRN: [id number] Order received for poss G tube placement in pt. Latest imaging studies were reviewed by Dr. Jenna and pt's anatomy is not favorable for percutaneous G tube placement. For any additional questions please contact Dr. Jenna directly at [phone] or pager [phone].

## 2023-12-06 NOTE — Progress Notes (Signed)
 Notified by patient's wife that patient had a nose bleed. Patient's left nostril was bleeding bright red blood. Saw a few medium size clots. Patient cleaned up & Dr. Lue notified. Patient stable at this time

## 2023-12-06 NOTE — Progress Notes (Signed)
 PHARMACY - TOTAL PARENTERAL NUTRITION CONSULT NOTE   Indication: Bowel Obstruction  Patient Measurements: Height: 6' 4 (193 cm) Weight: 81.2 kg (179 lb 0.2 oz) IBW/kg (Calculated) : 86.8 TPN AdjBW (KG): 94.6 Body mass index is 21.79 kg/m.  Assessment:  Pharmacy is consulted to start TPN on 73 yo male diagnosed with bowel obstruction. This admission CT abdomen shows increasing small bowel dilatation and fecalization of bowel contents involving the jejunum. No definitive transition point is identified at this time but caliber change is noted in the right mid abdomen at the junction of the jejunum and ileum. The more distal ileum is unremarkable. Some suspicion that bowel obstruction may be related to bortezomib  treatment pt receiving for multiple myeloma.   Glucose / Insulin : no hx of DM. Goal BG <150.  CBG range: 103-120, and no SSI used in last 6 days. D/c CBGs Electrolytes: K 3.9 below goal > 4, others WNL including CorrCa (9.7) Renal: SCr < 1, BUN stable Hepatic: LFTs WNL, alb 2.5; TG 53 Intake / Output; MIVF:  - Output: NG output inc to 2150, urine 2475 mL  - Stool:  x2 occurrence (9/1) GI Imaging: - Admitted from 10/14/23-10/20/23 for symptoms related to SBO.  CT: Transition point suspected in the right hemi-abdomen. Received non-operative management.  - Admitted 11/02/23 - 11/05/2023 for small bowel dilatation/concern for small bowel obstruction. CT imaging showed persistent small bowel dilatation with mild transition point noted deep in pelvis. He went to OR 11/03/23, findings were suspicious for intermittent volvulus of small bowel to explain his symptoms and CT imaging.  - 8/10  CT abdomen shows increasing small bowel dilatation and fecalization of bowel contents involving the jejunum. No definitive transition point is identified at this time but caliber change is noted in the right mid abdomen at the junction of the jejunum and ileum.  GI Surgeries / Procedures:  - 8/1 diagnostic  laparoscopy - 8/28: EGD, enteroscopy - small hiatal hernia, gastritis, duodenum/jejunum appeared normal; several biopsies taken  Central access:  PICC 8/18   Nutritional Goals: Goal TPN rate is 100  mL/hr (provides 132 g of protein and 2438 kcals per day)  RD Assessment:  Estimated Needs Total Energy Estimated Needs: 2400-2600 kcals Total Protein Estimated Needs: 120-135 grams Total Fluid Estimated Needs: >/= 2.4L  Current Nutrition:  NPO and TPN  Plan:  Now:  KCl 10mEq IV x4 runs  At 18:00 Continue TPN at 100 mL/hr Electrolytes in TPN:  Na 75 mEq/L  K 50 mEq/L Ca 2.5 mEq/L Mg 5 mEq/L Phos 15 mmol/L  Cl:Ac 1:2 Add standard MVI and trace elements to TPN D/C CBGs checks and SSI orders.  Monitor TPN labs on Mon/Thurs    Wanda Hasting PharmD, BCPS [facility] main pharmacy [phone] 12/06/2023 7:01 AM

## 2023-12-06 NOTE — Progress Notes (Signed)
 PROGRESS NOTE    David Mcgrath  FMW:[id number] DOB: [birth year] DOA: 11/12/2023 PCP: [name], DO   Brief Narrative:  73 y.o. male with medical history significant for hypertension, hyperlipidemia, paroxysmal A-fib on Eliquis , chronic anxiety/depression, polyneuropathy, multiple myeloma (diagnosed March 2025) received Bortezomib  infusion 11/09/2023.  Patient presented with worsening abdominal pain and distention noted to have persistent ileus on exam and imaging with continued gastric distention.  General surgery and GI consulted at intake, upper GI series and small bowel follow-through confirmed delayed gastric emptying -notable contrast in the rectum confirms no complete obstruction.  Initially evaluated with endoscopy and exploratory laparoscopy with no clear findings or etiology for patient's symptoms.  At this time patient continues with low intermittent suction via NG tube with TPN for nutrition.  Tentative plan per prior discussion for possible IR placement of GJ tube for both definitive route for nutrition as well as venting G-tube for any ongoing issues.   Assessment & Plan:   Principal Problem:   Ileus (HCC) Active Problems:   Hypokalemia   Paroxysmal atrial fibrillation (HCC)   Multiple myeloma (HCC)   Essential hypertension   Depression   Hyperlipidemia   Pressure injury of skin   Protein-calorie malnutrition, severe   Hypernatremia   Swelling of lower extremity   Transaminitis   Acute urinary retention   NSVT (nonsustained ventricular tachycardia) (HCC)   Severe protein calorie malnutrition - Patient remains n.p.o. to low intermittent suction, unable to tolerate even clears earlier this week  - Continues on TPN   Ileus POA, ongoing  Partial small bowel obstruction previously ruled out - Imaging continues to confirm ileus, small bowel obstruction unlikely given contrast continues to present to the colon - Status post EGD and exploratory laparoscopy  without any clear findings to explain imaging or symptoms - Appreciate GI and general surgery insight and recommendations -no further indication for repeat procedure at this time - Continue supportive care as tolerated, on TPN and NG to low intermittent suction as above - Plan for IR to evaluate for GJ tube as above - Patient did not tolerate erythromycin  trial, - Wean narcotics as appropriate - Transient improvement 8/28-30th with acute decompensation and diminished p.o. intake over the subsequent few days with no real improvement. - Continue supportive care, repeat imaging without overt changes or improvement - Avoid neostigmine per recommendations   Leukocytosis -Likely reactive, patient noted to have large GI losses on 8/16 and subsequently became hypotensive with elevated lactic acid levels. - Patient placed on IV fluids with a bolus and maintenance fluids with normalization of lactic acid on 11/19/2023. - Low suspicion for infection. - Status post IV fluids. - Continue to hold antibiotics at this time as no further leukocytosis, patient afebrile - Patient noted to have some orthostatic hypotension on 11/28/2023 while working with PT/OT and advised Ace wrap/TED hose while working with PT.   Diarrhea -Patient noted to have reported semiformed stools. - GI pathogen panel negative. - Patient likely colonized with C. difficile as testing was negative for any active infection (antigen positive, toxin negative, PCR negative). - Patient currently with an ileus. - Follow.   Hypotension - Resolved with hydration early on in the hospitalization. Acute urinary retention, resolved -Foley catheter placed 11/13/2023 -now removed with appropriate urine output Hypotension- Resolved with hydration early on in the hospitalization. History of multiple myeloma - Per Dr. Von he was found somewhat patient symptoms thought to be due to chemotherapeutic medications and oncology reengaged to see if they  had  any further recommendations.   -Outpatient follow-up with oncology recommended   Paroxysmal A-fib/NSVT -Prior episodes of nonsustained V. tach and paroxysmal A-fib on telemetry, asymptomatic - Echo 4/24 EF 60 to 65% -no indication to repeat at this time - Continue scheduled metoprolol  IV -no as needed required - Previously on Eliquis  -on hold given recent EGD with biopsy and n.p.o. status -on heparin  drip  Left finger discoloration, unspecified - Resolved. - Transient episode of discoloration of left fingers on 8/17-18 that resolved over 48 hours -questionable Raynaud's phenomenon - Left upper extremity arterial Dopplers were done with no obstruction of the left upper extremity with triphasic waves. - Patient seen in consultation by vascular surgery who felt no surgical intervention needed at this time.  Normocytic anemia -Patient with no significant overt bleeding. -Status post 2u PRBC   Multiple electrolyte abnormalities secondary to above Hypovolemic hypernatremia Hypokalemia/hypomagnesemia - Na elevated in the setting of dehydration - continue fluid supplementation, resolved - Patient currently on TPN. - Labs currently improved   Abnormal LFTs - Acute hepatitis panel negative - Complicated by above, improving   Bilateral lower extremity swelling - Negative lower extremity DVT study  - Superficial thrombus left small saphenous vein noted  Lactic acidosis -Resolved.    DVT prophylaxis: Place and maintain sequential compression device Start: 11/16/23 1142 Code Status:   Code Status: Full Code Family Communication: Wife at bedside  Status is: Inpatient  Dispo: The patient is from: Home              Anticipated d/c is to: To be determined              Anticipated d/c date is: To be determined              Patient currently not medically stable for discharge given ongoing need for possible procedure, TPN requirements and NG tube to suction  Consultants:   Gastroenterology: Dr. Kriss 11/19/2023 Vascular surgery: Dr.Robins 11/20/2023 General Surgery Interventional radiology  Procedures:  CT abdomen and pelvis 11/13/2023, 11/16/2023, 12/05/2023 Upper extremity arterial duplex, left 11/21/2023 Bilateral lower extremity Dopplers 11/26/2023 SBO protocol 11/17/2023 PICC line Small bowel enteroscopy/EGD: Per GI: Dr. Rosalie 11/30/2023 Transfused 2 units PRBCs 12/05/2023  Antimicrobials:  None   Subjective: No acute issues/events overnight  Objective: Vitals:   12/06/23 0333 12/06/23 0400 12/06/23 0500 12/06/23 0600  BP:  (!) 169/62  (!) 161/71  Pulse: 85 84  80  Resp: 18 19  (!) 22  Temp:  98.1 F (36.7 C)    TempSrc:  Axillary    SpO2: 97% 97%  97%  Weight:   81.2 kg   Height:        Intake/Output Summary (Last 24 hours) at 12/06/2023 0718 Last data filed at 12/06/2023 0544 Gross per 24 hour  Intake 3725.74 ml  Output 4625 ml  Net -899.26 ml   Filed Weights   12/04/23 0500 12/05/23 0500 12/06/23 0500  Weight: 81.3 kg 78.9 kg 81.2 kg    Examination:  General exam: Appears calm and comfortable  Respiratory system: Clear to auscultation. Respiratory effort normal. Cardiovascular system: S1 & S2 heard, RRR. No JVD, murmurs, rubs, gallops or clicks. No pedal edema. Gastrointestinal system: Abdomen is nondistended, soft and nontender. No organomegaly or masses felt. Normal bowel sounds heard. Central nervous system: Alert and oriented. No focal neurological deficits. Extremities: Symmetric 5 x 5 power. Skin: No rashes, lesions or ulcers Psychiatry: Judgement and insight appear normal. Mood & affect appropriate.  Data Reviewed: I have personally reviewed following labs and imaging studies  CBC: Recent Labs  Lab 12/03/23 2343 12/04/23 0502 12/04/23 0838 12/04/23 1511 12/05/23 0524 12/05/23 2000 12/06/23 0533  WBC 5.2 5.3 6.0  --  5.6  --  8.2  HGB 7.9* 8.0* 8.0* 8.6* 7.1* 9.8* 10.5*  HCT 25.4* 24.9* 25.3* 27.3* 22.9*  30.5* 32.9*  MCV 98.1 98.4 98.1  --  97.0  --  96.2  PLT 269 255 269  --  273  --  330   Basic Metabolic Panel: Recent Labs  Lab 11/30/23 0500 12/01/23 0430 12/02/23 0500 12/02/23 1817 12/03/23 0911 12/04/23 0502 12/05/23 0524 12/06/23 0533  NA 144 140 136 133* 134* 135 134* 136  K 4.1 4.1 4.1 3.9 4.1 3.9 4.2 3.9  CL 112* 109 106 104 104 103 102 103  CO2 23 23 22  21* 23 23 24 23   GLUCOSE 120* 113* 91 98 111* 106* 104* 115*  BUN 33* 29* 32* 30* 25* 22 24* 21  CREATININE 0.55* 0.44* 0.48* 0.45* 0.48* 0.44* 0.53* 0.48*  CALCIUM  8.1* 7.9* 8.1* 8.0* 8.0* 8.3* 8.1* 8.5*  MG 2.3 2.2 2.2 2.1 2.0 2.2  --  2.1  PHOS 3.4 2.9 2.8  --   --  3.3  --   --    GFR: Estimated Creatinine Clearance: 95.9 mL/min (A) (by C-G formula based on SCr of 0.48 mg/dL (L)). Liver Function Tests: Recent Labs  Lab 11/30/23 0500 12/04/23 0502  AST 36 24  ALT 76* 40  ALKPHOS 142* 131*  BILITOT 0.5 0.4  PROT 5.5* 5.4*  ALBUMIN  2.5* 2.5*   No results for input(s): LIPASE, AMYLASE in the last 168 hours. No results for input(s): AMMONIA in the last 168 hours. Coagulation Profile: No results for input(s): INR, PROTIME in the last 168 hours. Cardiac Enzymes: No results for input(s): CKTOTAL, CKMB, CKMBINDEX, TROPONINI in the last 168 hours. BNP (last 3 results) No results for input(s): PROBNP in the last 8760 hours. HbA1C: No results for input(s): HGBA1C in the last 72 hours. CBG: Recent Labs  Lab 12/04/23 1540 12/04/23 2326 12/05/23 0808 12/05/23 1535 12/05/23 2333  GLUCAP 76 115* 110* 103* 120*   Lipid Profile: Recent Labs    12/04/23 0502  TRIG 53   Thyroid  Function Tests: No results for input(s): TSH, T4TOTAL, FREET4, T3FREE, THYROIDAB in the last 72 hours. Anemia Panel: Recent Labs    12/05/23 0903  VITAMINB12 984*  FOLATE 8.2  FERRITIN 196  TIBC 185*  IRON 17*   Sepsis Labs: Recent Labs  Lab 12/05/23 0524  LATICACIDVEN 0.7    Recent  Results (from the past 240 hours)  Culture, blood (Routine X 2) w Reflex to ID Panel     Status: None (Preliminary result)   Collection Time: 12/05/23  5:24 AM   Specimen: BLOOD RIGHT HAND  Result Value Ref Range Status   Specimen Description   Final    BLOOD RIGHT HAND Performed at [facility], [address], [place], [place] [address]    Special Requests   Final    BOTTLES DRAWN AEROBIC AND ANAEROBIC Blood Culture results may not be optimal due to an inadequate volume of blood received in culture bottles Performed at [facility], [address], [place], [place] [address]    Culture   Final    NO GROWTH < 24 HOURS Performed at [facility], [address] [place], [place] [address]  Report Status PENDING  Incomplete  Culture, blood (Routine X 2) w Reflex to ID Panel     Status: None (Preliminary result)   Collection Time: 12/05/23  5:33 AM   Specimen: BLOOD  Result Value Ref Range Status   Specimen Description   Final    BLOOD SITE NOT SPECIFIED Performed at [facility], [address], [place], [place] [address]    Special Requests   Final    BOTTLES DRAWN AEROBIC ONLY Blood Culture results may not be optimal due to an inadequate volume of blood received in culture bottles Performed at [facility], [address] [place], [place] [address]    Culture   Final    NO GROWTH < 24 HOURS Performed at [facility], [address] [place], [place] [address]    Report Status PENDING  Incomplete         Radiology Studies: CT ABDOMEN PELVIS W CONTRAST Result Date: 12/05/2023 CLINICAL DATA:  Bowel obstruction suspected. EXAM: CT ABDOMEN AND PELVIS WITH CONTRAST TECHNIQUE: Multidetector CT imaging of the abdomen and pelvis was performed using the standard protocol following bolus administration of intravenous contrast. RADIATION DOSE REDUCTION: This exam was performed according to the departmental  dose-optimization program which includes automated exposure control, adjustment of the mA and/or kV according to patient size and/or use of iterative reconstruction technique. CONTRAST:  OMNIPAQUE  IOHEXOL  300 MG/ML  SOLN COMPARISON:  CT abdomen and pelvis 11/16/2023. FINDINGS: Lower chest: There are patchy airspace opacities in the bilateral lower lobes, right greater this is similar to prior. Hepatobiliary: No focal liver abnormality is seen. No gallstones, gallbladder wall thickening, or biliary dilatation. Pancreas: Unremarkable. No pancreatic ductal dilatation or surrounding inflammatory changes. Spleen: Normal in size without focal abnormality. Adrenals/Urinary Tract: The bladder wall is trabeculated. The kidneys and adrenal glands are within normal limits. Stomach/Bowel: There are dilated small bowel loops in the right abdomen measuring up to 7 cm. Transition point is seen in the mid abdomen. Distal small bowel and colon are nondilated. Colonic diverticula are present. The colon is nondilated. There is a large amount of stool in the rectum with some presacral edema and rectal wall thickening. The appendix is not visualized. The stomach is distended despite nasogastric tube tip in the body of the stomach. Vascular/Lymphatic: Aortic atherosclerosis. No enlarged abdominal or pelvic lymph nodes. Reproductive: There are calcifications of the prostate gland. Other: There is no ascites or focal abdominal wall hernia. There is mild body wall edema. Musculoskeletal: Degenerative changes affect the spine and hips. IMPRESSION: 1. Small-bowel obstruction with transition point in the mid abdomen. 2. Large amount of stool in the rectum with rectal wall thickening and presacral edema. 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 3. Colonic diverticulosis. 4. Trabeculated bladder wall. 5. Mild body wall edema. 6. Patchy airspace opacities in the bilateral lower lobes, right greater than left, similar to prior. 7.  Aortic atherosclerosis. Aortic Atherosclerosis (ICD10-I70.0). Electronically Signed   By: Greig Pique M.D.   On: 12/05/2023 23:19   DG Abd Portable 1V Result Date: 12/05/2023 CLINICAL DATA:  Ileus. EXAM: PORTABLE ABDOMEN - 1 VIEW COMPARISON:  December 04, 2023. FINDINGS: Nasogastric tube tip is again noted in proximal stomach. Increased small bowel dilatation is seen centrally and right upper quadrant concerning for obstruction possibly ileus. No definite colonic dilatation is noted. IMPRESSION: Increased small bowel dilatation is noted concerning for obstruction or possibly ileus. Electronically Signed   By: Lynwood Landy Mickey CHRISTELLA.D.  On: 12/05/2023 13:38   DG Abd Portable 1V Result Date: 12/04/2023 CLINICAL DATA:  [id number] Ileus (HCC) [id number] EXAM: PORTABLE ABDOMEN - 1 VIEW COMPARISON:  December 03, 2023 FINDINGS: Diffuse gaseous distension of loops of small and large bowel. Enteric tube tip and side port project over the stomach. Degree of dilation is similar compared to prior with the most prominent loops of bowel in the upper abdomen. Air is visualized in the rectum. IMPRESSION: Similar degree of gaseous distension of loops of small and large bowel suggestive of ileus. Electronically Signed   By: Corean Salter M.D.   On: 12/04/2023 10:39        Scheduled Meds:  Chlorhexidine  Gluconate Cloth  6 each Topical Q2200   fluticasone   1 spray Each Nare Daily   insulin  aspart  0-9 Units Subcutaneous Q8H   metoprolol  tartrate  5 mg Intravenous Q8H   pantoprazole  (PROTONIX ) IV  40 mg Intravenous Q12H   sodium chloride  flush  10-40 mL Intracatheter Q12H   Continuous Infusions:  heparin  2,150 Units/hr (12/06/23 0120)   TPN ADULT (ION) 100 mL/hr at 12/06/23 0120     LOS: 23 days   Time spent:  Elsie JAYSON Montclair, DO [facility]  If 7PM-7AM, please contact night-coverage [email or web address]  12/06/2023, 7:18 AM

## 2023-12-06 NOTE — Progress Notes (Signed)
 Occupational Therapy Treatment Patient Details Name: David Mcgrath MRN: [id number] DOB: 22-Jun-1950 Today's Date: 12/06/2023   History of present illness Pt is 73 yo male who presented on 11/12/23 with nausea, vomiting, diarrhea, hypotension.  CT raised concern for ileus vs partial SBO.  Pt recently admitted 7/31-8/3 for SBO, pt underwent diagnostic laparoscopy on 8/1.  This admission pt with persistent ileus, treated with NG tube 8/16 and replaced 8/21.  Surgery consulted and did not feel repeat laparoscopy beneficial.  TPN started 8/18. GI reconsulted for possible EGD but continuing erythromycin  for now. Pt also with hypotension, urinary retention, PAF, hyponatremia/hypokalemia/hypomagnesemia/hypoglycemia.  Pt also with LE edema - findings consistent with chronic DVT.  Pt with hx including but not limited to  hypertension, hyperlipidemia, paroxysmal A-fib on Eliquis , chronic anxiety/depression, polyneuropathy, multiple myeloma (diagnosed March 2025) received Bortezomib  infusion 11/09/2023.   OT comments  Patient seen for skilled OT session this am. Wife bedside and patient highly motivated to mobilize and participate with all therapy presented. Patient had already completed bed level BADL's with nursing earlier thus focus on activity tolerance progression for functional mobility this session. Patient continues to require increased overall time for processing required but improved bed mobility, STS and step transfers to recliner with RW. OT encouraged IS, light UE HEP including L1 tband and foam grasp ball with wife and patient able to teach back between visits and activity. End BP 184/79 with LE's elevated in recliner. Based on plof, progression and therapy presentation and motivation, family involvement, and anticipated further progress, patient will benefit from intensive inpatient follow-up therapy, >3 hours/day. Patient requires continued Acute care hospital level OT services to progress safety  and functional performance and allow for discharge.        If plan is discharge home, recommend the following:  Two people to help with walking and/or transfers;Two people to help with bathing/dressing/bathroom;Assistance with cooking/housework;Assistance with feeding;Direct supervision/assist for medications management;Direct supervision/assist for financial management;Assist for transportation;Help with stairs or ramp for entrance   Equipment Recommendations  Other (comment) (TBD post rehab)       Precautions / Restrictions Precautions Precautions: Fall Recall of Precautions/Restrictions: Intact Precaution/Restrictions Comments: NG drain, watch BP, TEDS Restrictions Weight Bearing Restrictions Per Provider Order: No       Mobility Bed Mobility Overal bed mobility: Needs Assistance Bed Mobility: Rolling, Sidelying to Sit Rolling: Mod assist Sidelying to sit: Mod assist Supine to sit: Mod assist     General bed mobility comments: multimodal cues with increased time but less assist this session    Transfers Overall transfer level: Needs assistance Equipment used: Rolling walker (2 wheels) Transfers: Sit to/from Stand, Bed to chair/wheelchair/BSC Sit to Stand: Mod assist, +2 physical assistance, +2 safety/equipment, From elevated surface     Step pivot transfers: Mod assist, +2 physical assistance, +2 safety/equipment, From elevated surface     General transfer comment: cues for hand placement and to slow deceleration STS     Balance Overall balance assessment: Needs assistance Sitting-balance support: Bilateral upper extremity supported, Feet supported Sitting balance-Leahy Scale: Fair Sitting balance - Comments: head forward posture Postural control: Other (comment) (forward head posture) Standing balance support: During functional activity, Reliant on assistive device for balance, Bilateral upper extremity supported Standing balance-Leahy Scale: Poor                              ADL either performed or assessed with clinical judgement   ADL Overall  ADL's : Needs assistance/impaired Eating/Feeding: NPO                                   Functional mobility during ADLs: Moderate assistance;+2 for physical assistance;+2 for safety/equipment;Rolling walker (2 wheels) General ADL Comments: focus of session on mobility and pacing as grooming was completed bed level prior to OT arrival    Extremity/Trunk Assessment Upper Extremity Assessment Upper Extremity Assessment: Generalized weakness;Right hand dominant   Lower Extremity Assessment Lower Extremity Assessment: Defer to PT evaluation        Vision   Vision Assessment?: No apparent visual deficits;Wears glasses for reading   Perception     Praxis     Communication Communication Communication: No apparent difficulties Factors Affecting Communication: Difficulty expressing self   Cognition Arousal: Alert Behavior During Therapy: Anxious (mildly decreased frustration tolerance) Cognition: Cognition impaired   Orientation impairments: Situation Awareness: Online awareness impaired, Intellectual awareness impaired Memory impairment (select all impairments): Short-term memory Attention impairment (select first level of impairment): Sustained attention Executive functioning impairment (select all impairments): Initiation, Problem solving, Sequencing OT - Cognition Comments: continues to require increased processing time with recall delays                 Following commands: Intact Following commands impaired: Follows one step commands with increased time      Cueing   Cueing Techniques: Verbal cues, Tactile cues  Exercises Exercises: Other exercises (reinforced with patient and wife for IS q hr, yellow L1 tband and foam squeeze ball 3x per day 10 reps B UE's)       General Comments post tx BP seated in chair with thigh high TEDs in place 184/79, no SOB  this session    Pertinent Vitals/ Pain       Pain Assessment Pain Assessment: Faces Faces Pain Scale: Hurts a little bit Pain Location: nasal region around tube Pain Descriptors / Indicators: Grimacing Pain Intervention(s): Monitored during session   Frequency  Min 2X/week        Progress Toward Goals  OT Goals(current goals can now be found in the care plan section)  Progress towards OT goals: Progressing toward goals  Acute Rehab OT Goals Patient Stated Goal: to get moving OT Goal Formulation: With patient/family Time For Goal Achievement: 12/13/23 Potential to Achieve Goals: Good ADL Goals Pt Will Perform Lower Body Bathing: with min assist;sit to/from stand Pt Will Perform Lower Body Dressing: with min assist;sit to/from stand Pt Will Transfer to Toilet: with min assist;bedside commode Pt Will Perform Toileting - Clothing Manipulation and hygiene: with min assist;sit to/from stand Pt/caregiver will Perform Home Exercise Program: Increased strength;Both right and left upper extremity;With written HEP provided;With Supervision Additional ADL Goal #1: Patient will integrate simple ECT into BADL's and mobility with min cues  Plan      Co-evaluation    PT/OT/SLP Co-Evaluation/Treatment: Yes Reason for Co-Treatment: Complexity of the patient's impairments (multi-system involvement) PT goals addressed during session: Mobility/safety with mobility;Balance;Proper use of DME OT goals addressed during session: ADL's and self-care;Proper use of Adaptive equipment and DME      AM-PAC OT 6 Clicks Daily Activity     Outcome Measure   Help from another person eating meals?: Total (except ice chips) Help from another person taking care of personal grooming?: A Lot Help from another person toileting, which includes using toliet, bedpan, or urinal?: Total Help from another person bathing (including washing,  rinsing, drying)?: A Lot Help from another person to put on and  taking off regular upper body clothing?: A Lot Help from another person to put on and taking off regular lower body clothing?: Total 6 Click Score: 9    End of Session Equipment Utilized During Treatment: Gait belt;Rolling walker (2 wheels)  OT Visit Diagnosis: Unsteadiness on feet (R26.81);Muscle weakness (generalized) (M62.81);Feeding difficulties (R63.3);Cognitive communication deficit (R41.841)   Activity Tolerance Patient tolerated treatment well   Patient Left in chair;with call bell/phone within reach;with chair alarm set;with family/visitor present;with nursing/sitter in room   Nurse Communication Mobility status;Precautions;Other (comment) (Ending BP)        Time: 8994-8974 OT Time Calculation (min): 20 min  Charges: OT General Charges $OT Visit: 1 Visit  Akiva Brassfield OT/L Acute Rehabilitation Department  [phone]  12/06/2023, 2:23 PM

## 2023-12-06 NOTE — Progress Notes (Signed)
 [facility] Gastroenterology Progress Note  Parry Po III 73 y.o. 10-25-1950   Subjective: Large loose stool this morning following suppository per nursing. NG tube draining. Sitting in bedside chair.  Objective: Vital signs: Vitals:   12/06/23 1200 12/06/23 1300  BP: (!) 168/74 (!) 156/74  Pulse:  97  Resp: (!) 28 (!) 22  Temp: 99 F (37.2 C)   SpO2:  100%    Physical Exam: Gen: chronically ill-appearing, lethargic, elderly, no acute distress  HEENT: anicteric sclera CV: RRR Chest: CTA B Abd: mild distention, nontender, soft, decreased bowel sounds Ext: no edema  Lab Results: Recent Labs    12/04/23 0502 12/05/23 0524 12/06/23 0533  NA 135 134* 136  K 3.9 4.2 3.9  CL 103 102 103  CO2 23 24 23   GLUCOSE 106* 104* 115*  BUN 22 24* 21  CREATININE 0.44* 0.53* 0.48*  CALCIUM  8.3* 8.1* 8.5*  MG 2.2  --  2.1  PHOS 3.3  --   --    Recent Labs    12/04/23 0502  AST 24  ALT 40  ALKPHOS 131*  BILITOT 0.4  PROT 5.4*  ALBUMIN  2.5*   Recent Labs    12/05/23 0524 12/05/23 2000 12/06/23 0533  WBC 5.6  --  8.2  HGB 7.1* 9.8* 10.5*  HCT 22.9* 30.5* 32.9*  MCV 97.0  --  96.2  PLT 273  --  330      Assessment/Plan: Partial small bowel obstruction with moderate to large NG tube output. Venting G-tube recommended but not amenable to placement by IR or endoscopically. Will give a tap water  enema this afternoon for fecal impaction and hopefully with better movement of his bowels, then small bowel obstruction will resolve. Continue supportive care. Will follow.   [name] 12/06/2023, 2:04 PM  Questions please call [phone] ID: Norleen Mardy Sieving [name], male   DOB: 08/03/1950, 73 y.o.   MRN: [id number]

## 2023-12-06 NOTE — Progress Notes (Signed)
   Inpatient Rehab Admissions Coordinator :  Per therapy change in recommendations, patient was screened for CIR candidacy by Ottie Glazier RN MSN.  At this time patient appears to be a potential candidate for CIR. I will place a rehab consult per protocol for full assessment. Please call me with any questions.  Ottie Glazier RN MSN Admissions Coordinator [phone]

## 2023-12-06 NOTE — Progress Notes (Signed)
 PHARMACY - ANTICOAGULATION CONSULT NOTE  Pharmacy Consult for heparin  Indication: atrial fibrillation, VTE  Allergies  Allergen Reactions   Shrimp [Shellfish Allergy] Anaphylaxis and Swelling    Swelling throat    Reglan  [Metoclopramide ] Other (See Comments)    caused tardive dyskinesia    Patient Measurements: Height: 6' 4 (193 cm) Weight: 81.2 kg (179 lb 0.2 oz) IBW/kg (Calculated) : 86.8 HEPARIN  DW (KG): 94.6  Vital Signs: Temp: 98.1 F (36.7 C) (09/03 0400) Temp Source: Axillary (09/03 0400) BP: 161/71 (09/03 0600) Pulse Rate: 80 (09/03 0600)  Labs: Recent Labs    12/04/23 0502 12/04/23 0838 12/04/23 1511 12/05/23 0524 12/05/23 2000 12/06/23 0533  HGB 8.0* 8.0*   < > 7.1* 9.8* 10.5*  HCT 24.9* 25.3*   < > 22.9* 30.5* 32.9*  PLT 255 269  --  273  --  330  HEPARINUNFRC  --  0.41  --  0.30  --  0.37  CREATININE 0.44*  --   --  0.53*  --  0.48*   < > = values in this interval not displayed.    Estimated Creatinine Clearance: 95.9 mL/min (A) (by C-G formula based on SCr of 0.48 mg/dL (L)).   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  Patient reports he does not have symptoms as of 02/25/14.     Diarrhea 06/04/2023   Essential hypertension 06/04/2023   Fatty liver 06/04/2023   Flu 06/04/2023   Hyperlipidemia 06/04/2023   Ileus (HCC) 06/04/2023   Multiple myeloma (HCC)    Neuropathy    Paroxysmal atrial fibrillation (HCC) 07/04/2023   Pneumonia 06/04/2023     Assessment: 73 year old male with afib on Eliquis  PTA which is currently on hold. Patient recently admitted for bowel obstruction and discharged home after conservative treatment with NGT decompression. He presented back with similar concerns, is currently on TPN.  Pharmacy is consulted for heparin  drip while apixaban  remains on hold. Doppler with bilateral chronic DVT.   Significant Events: - 8/24 Doppler: + right common femoral chronic DVT, + left common femoral chronic DVT, + left age indeterminate superficial vein thrombosis - 8/27: Several nosebleeds overnight. Op note today mentions mild NGT trauma. NG removed.  - 8/28: UFH off at 06:00 for enteroscopy.  UFH resumed at 17:03 - 8/31 New bright red blood noted in NGT tubing (see RN note), heparin  level noted to be therapeutic  Today, 12/06/23 Heparin  level 0.37, remains therapeutic on heparin  2150 units/hr Hgb low/improved to 10.5 s/p transfusion.  Plt WNL.   No active bleeding or complications reported.  NG output dark yellow/bile color per RN.  No additional complications noted at this time.    Goal of Therapy:  Heparin  level 0.3-0.7 units/ml Monitor platelets by anticoagulation protocol: Yes   Plan:  Continue heparin  infusion at 2150 units/hr  CBC, heparin  level daily Monitor for signs of bleeding   Thank you for allowing pharmacy to be a part of this patient's care.  Wanda Hasting PharmD, BCPS [facility] main pharmacy [phone] 12/06/2023 6:58 AM

## 2023-12-06 NOTE — Progress Notes (Signed)
 Chaplain met with pt David Mcgrath and wife Vertell per [facility] chaplain referral. They both shared their experience of the last 20+ days and feel hopeful that it's coming to its conclusion. Jarrell stated he feels well supported by his family and church community, and that their minister has come to visit a number of times to offer prayer and presence. They appreciated my visit and invited me to pray with them, which I happily did. They also state they've [facility] by [facility]. Finally, they are aware chaplains remain available as needs arise.

## 2023-12-07 ENCOUNTER — Other Ambulatory Visit

## 2023-12-07 ENCOUNTER — Inpatient Hospital Stay (HOSPITAL_COMMUNITY)

## 2023-12-07 ENCOUNTER — Inpatient Hospital Stay: Admitting: Hematology and Oncology

## 2023-12-07 ENCOUNTER — Inpatient Hospital Stay

## 2023-12-07 DIAGNOSIS — K567 Ileus, unspecified: Secondary | ICD-10-CM | POA: Diagnosis not present

## 2023-12-07 LAB — COMPREHENSIVE METABOLIC PANEL WITH GFR
ALT: 33 U/L (ref 0–44)
AST: 22 U/L (ref 15–41)
Albumin: 2.5 g/dL — ABNORMAL LOW (ref 3.5–5.0)
Alkaline Phosphatase: 117 U/L (ref 38–126)
Anion gap: 8 (ref 5–15)
BUN: 24 mg/dL — ABNORMAL HIGH (ref 8–23)
CO2: 24 mmol/L (ref 22–32)
Calcium: 8.5 mg/dL — ABNORMAL LOW (ref 8.9–10.3)
Chloride: 103 mmol/L (ref 98–111)
Creatinine, Ser: 0.44 mg/dL — ABNORMAL LOW (ref 0.61–1.24)
GFR, Estimated: >60 mL/min (ref >60)
Glucose, Bld: 111 mg/dL — ABNORMAL HIGH (ref 70–99)
Potassium: 4.1 mmol/L (ref 3.5–5.1)
Sodium: 136 mmol/L (ref 135–145)
Total Bilirubin: 0.7 mg/dL (ref 0.0–1.2)
Total Protein: 5.6 g/dL — ABNORMAL LOW (ref 6.5–8.1)

## 2023-12-07 LAB — GLUCOSE, CAPILLARY
Glucose-Capillary: 105 mg/dL — ABNORMAL HIGH (ref 70–99)
Glucose-Capillary: 92 mg/dL (ref 70–99)
Glucose-Capillary: 93 mg/dL (ref 70–99)

## 2023-12-07 LAB — CBC
HCT: 31 % — ABNORMAL LOW (ref 39.0–52.0)
Hemoglobin: 9.8 g/dL — ABNORMAL LOW (ref 13.0–17.0)
MCH: 30.9 pg (ref 26.0–34.0)
MCHC: 31.6 g/dL (ref 30.0–36.0)
MCV: 97.8 fL (ref 80.0–100.0)
Platelets: 307 K/uL (ref 150–400)
RBC: 3.17 MIL/uL — ABNORMAL LOW (ref 4.22–5.81)
RDW: 18.1 % — ABNORMAL HIGH (ref 11.5–15.5)
WBC: 6.9 K/uL (ref 4.0–10.5)
nRBC: 0 % (ref 0.0–0.2)

## 2023-12-07 LAB — HEPARIN LEVEL (UNFRACTIONATED)
Heparin Unfractionated: 0.23 [IU]/mL — ABNORMAL LOW (ref 0.30–0.70)
Heparin Unfractionated: 0.27 [IU]/mL — ABNORMAL LOW (ref 0.30–0.70)

## 2023-12-07 LAB — MAGNESIUM: Magnesium: 2.3 mg/dL (ref 1.7–2.4)

## 2023-12-07 LAB — PHOSPHORUS: Phosphorus: 3.6 mg/dL (ref 2.5–4.6)

## 2023-12-07 MED ORDER — TRAVASOL 10 % IV SOLN
INTRAVENOUS | Status: AC
  Filled 2023-12-07: qty 1320

## 2023-12-07 MED ORDER — HEPARIN (PORCINE) 25000 UT/250ML-% IV SOLN
2300.0000 [IU]/h | INTRAVENOUS | Status: DC
  Administered 2023-12-07 – 2023-12-08 (×2): 2300 [IU]/h via INTRAVENOUS
  Filled 2023-12-07: qty 250

## 2023-12-07 NOTE — Progress Notes (Addendum)
 PHARMACY - ANTICOAGULATION CONSULT NOTE  Pharmacy Consult for heparin  Indication: atrial fibrillation, VTE  Allergies  Allergen Reactions   Shrimp [Shellfish Allergy] Anaphylaxis and Swelling    Swelling throat    Reglan  [Metoclopramide ] Other (See Comments)    caused tardive dyskinesia    Patient Measurements: Height: 6' 4 (193 cm) Weight: 80.2 kg (176 lb 12.9 oz) IBW/kg (Calculated) : 86.8 HEPARIN  DW (KG): 94.6  Vital Signs: Temp: 98.2 F (36.8 C) (09/04 1600) Temp Source: Axillary (09/04 1600) BP: 174/83 (09/04 1800) Pulse Rate: 91 (09/04 1800)  Labs: Recent Labs    12/05/23 0524 12/05/23 2000 12/06/23 0533 12/07/23 0527 12/07/23 1810  HGB 7.1* 9.8* 10.5* 9.8*  --   HCT 22.9* 30.5* 32.9* 31.0*  --   PLT 273  --  330 307  --   HEPARINUNFRC 0.30  --  0.37 0.23* 0.27*  CREATININE 0.53*  --  0.48* 0.44*  --     Estimated Creatinine Clearance: 94.7 mL/min (A) (by C-G formula based on SCr of 0.44 mg/dL (L)).   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  Patient reports he does not have symptoms as of 02/25/14.     Diarrhea 06/04/2023   Essential hypertension 06/04/2023   Fatty liver 06/04/2023   Flu 06/04/2023   Hyperlipidemia 06/04/2023   Ileus (HCC) 06/04/2023   Multiple myeloma (HCC)    Neuropathy    Paroxysmal atrial fibrillation (HCC) 07/04/2023   Pneumonia 06/04/2023     Assessment: 73 year old male with afib on Eliquis  PTA which is currently on hold. Patient recently admitted for bowel obstruction and discharged home after conservative treatment with NGT decompression. He presented back with similar concerns, is currently on TPN. Pharmacy is consulted for heparin  drip while apixaban  remains on hold. Doppler with bilateral  chronic DVT.   Significant Events: - 8/24 Doppler: + right common femoral chronic DVT, + left common femoral chronic DVT, + left age indeterminate superficial vein thrombosis - 8/27: Several nosebleeds overnight. Op note today mentions mild NGT trauma. NG removed.  - 8/28: UFH off at 06:00 for enteroscopy.  UFH resumed at 17:03 - 8/31 New bright red blood noted in NGT tubing (see RN note), heparin  level noted to be therapeutic  Today, 12/07/23 PM heparin  level slightly increased to 0.27 but remains sub-therapeutic after recent rate increase to 2200 units/hr (heparin  level had been at lower end of goal past two days) Hgb low/improved to 10.5 s/p transfusion yesterday and down to 9.8 today.  Plt WNL.   No active bleeding, interruption in infusion, or complications reported.  Per nurse, he had a bloody nose x1 yesterday with a few clots but it did stop on its own.   Goal of Therapy:  Heparin  level 0.3-0.7 units/ml Monitor platelets by anticoagulation protocol: Yes   Plan:  Increase IV heparin  infusion to 2300 units/hr  8 hour heparin  level following rate increase CBC, heparin  level daily Monitor for signs of bleeding   Thank you for allowing pharmacy to be a part of this patient's care.  Marget Hench, PharmD Clinical Pharmacist 12/07/2023 7:52 PM

## 2023-12-07 NOTE — Plan of Care (Signed)
  Problem: Clinical Measurements: Goal: Ability to maintain clinical measurements within normal limits will improve Outcome: Progressing   Problem: Activity: Goal: Risk for activity intolerance will decrease Outcome: Progressing   Problem: Coping: Goal: Level of anxiety will decrease Outcome: Progressing   Problem: Safety: Goal: Ability to remain free from injury will improve Outcome: Progressing   Problem: Coping: Goal: Ability to adjust to condition or change in health will improve Outcome: Progressing   Problem: Metabolic: Goal: Ability to maintain appropriate glucose levels will improve Outcome: Progressing

## 2023-12-07 NOTE — Progress Notes (Signed)
 PHARMACY - ANTICOAGULATION CONSULT NOTE  Pharmacy Consult for heparin  Indication: atrial fibrillation, VTE  Allergies  Allergen Reactions   Shrimp [Shellfish Allergy] Anaphylaxis and Swelling    Swelling throat    Reglan  [Metoclopramide ] Other (See Comments)    caused tardive dyskinesia    Patient Measurements: Height: 6' 4 (193 cm) Weight: 80.2 kg (176 lb 12.9 oz) IBW/kg (Calculated) : 86.8 HEPARIN  DW (KG): 94.6  Vital Signs: Temp: 98 F (36.7 C) (09/04 0700) Temp Source: Oral (09/04 0700) BP: 159/63 (09/04 0642) Pulse Rate: 79 (09/04 0745)  Labs: Recent Labs    12/05/23 0524 12/05/23 2000 12/06/23 0533 12/07/23 0527  HGB 7.1* 9.8* 10.5* 9.8*  HCT 22.9* 30.5* 32.9* 31.0*  PLT 273  --  330 307  HEPARINUNFRC 0.30  --  0.37 0.23*  CREATININE 0.53*  --  0.48* 0.44*    Estimated Creatinine Clearance: 94.7 mL/min (A) (by C-G formula based on SCr of 0.44 mg/dL (L)).   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  Patient reports he does not have symptoms as of 02/25/14.     Diarrhea 06/04/2023   Essential hypertension 06/04/2023   Fatty liver 06/04/2023   Flu 06/04/2023   Hyperlipidemia 06/04/2023   Ileus (HCC) 06/04/2023   Multiple myeloma (HCC)    Neuropathy    Paroxysmal atrial fibrillation (HCC) 07/04/2023   Pneumonia 06/04/2023     Assessment: 73 year old male with afib on Eliquis  PTA which is currently on hold. Patient recently admitted for bowel obstruction and discharged home after conservative treatment with NGT decompression. He presented back with similar concerns, is currently on TPN. Pharmacy is consulted for heparin  drip while apixaban  remains on hold. Doppler with bilateral chronic DVT.   Significant Events: - 8/24  Doppler: + right common femoral chronic DVT, + left common femoral chronic DVT, + left age indeterminate superficial vein thrombosis - 8/27: Several nosebleeds overnight. Op note today mentions mild NGT trauma. NG removed.  - 8/28: UFH off at 06:00 for enteroscopy.  UFH resumed at 17:03 - 8/31 New bright red blood noted in NGT tubing (see RN note), heparin  level noted to be therapeutic  Today, 12/07/23 05:27 Heparin  level 0.23, now sub-therapeutic on heparin  2150 units/hr (heparin  level had been at lower end of goal past two day) Hgb low/improved to 10.5 s/p transfusion yesterday and down to 9.8 today.  Plt WNL.   No active bleeding, interruption in infusion, or complications reported.  Per nurse, he had a bloody nose x1 yesterday with a few clots but it did stop on its own.   Goal of Therapy:  Heparin  level 0.3-0.7 units/ml Monitor platelets by anticoagulation protocol: Yes   Plan:  Increase IV heparin  infusion slightly up to 2200 units/hr  8 hour heparin  level following rate increase CBC, heparin  level daily Monitor for signs of bleeding   Thank you for allowing pharmacy to be a part of this patient's care.  Eleanor EMERSON Agent, PharmD, BCPS Clinical Pharmacist [facility] 12/07/2023 8:32 AM

## 2023-12-07 NOTE — Progress Notes (Signed)
 [facility] Gastroenterology Progress Note  David Mcgrath 73 y.o. [birth year]   Subjective: Loose and water  stools after 2 enemas. Denies abdominal pain. Wife in room.  Objective: Vital signs: Vitals:   12/07/23 1330 12/07/23 1335  BP:    Pulse: 87 86  Resp: (!) 30 17  Temp:    SpO2: 98% 99%  T 97.6, BP 164/85  NGT: 1.1 L in last 24 hours Physical Exam: Gen: elderly, thin, no acute distress, pleasant HEENT: anicteric sclera CV: RRR Chest: CTA B Abd: soft, nontender, nondistended, +BS Ext: no edema  Lab Results: Recent Labs    12/06/23 0533 12/07/23 0527  NA 136 136  K 3.9 4.1  CL 103 103  CO2 23 24  GLUCOSE 115* 111*  BUN 21 24*  CREATININE 0.48* 0.44*  CALCIUM  8.5* 8.5*  MG 2.1 2.3  PHOS  --  3.6   Recent Labs    12/07/23 0527  AST 22  ALT 33  ALKPHOS 117  BILITOT 0.7  PROT 5.6*  ALBUMIN  2.5*   Recent Labs    12/06/23 0533 12/07/23 0527  WBC 8.2 6.9  HGB 10.5* 9.8*  HCT 32.9* 31.0*  MCV 96.2 97.8  PLT 330 307      Assessment/Plan: Partial SBO with gastric distention and fecal impaction - NG tube draining. Loose to watery stools following enemas. Repeat Xray today. Repeat tap water  enemas this afternoon. Continue TPN. Supportive care. Will follow.   David Mcgrath 12/07/2023, 2:07 PM  Questions please call [phone] ID: David Mcgrath David Mcgrath, male   DOB: 12/05/1950, 73 y.o.   MRN: [id number]

## 2023-12-07 NOTE — Progress Notes (Signed)
 PHARMACY - TOTAL PARENTERAL NUTRITION CONSULT NOTE   Indication: Bowel Obstruction  Patient Measurements: Height: 6' 4 (193 cm) Weight: 80.2 kg (176 lb 12.9 oz) IBW/kg (Calculated) : 86.8 TPN AdjBW (KG): 94.6 Body mass index is 21.52 kg/m.  Assessment:  Pharmacy is consulted to start TPN on 73 yo male diagnosed with bowel obstruction. This admission CT abdomen shows increasing small bowel dilatation and fecalization of bowel contents involving the jejunum. No definitive transition point is identified at this time but caliber change is noted in the right mid abdomen at the junction of the jejunum and ileum. The more distal ileum is unremarkable. Some suspicion that bowel obstruction may be related to bortezomib  treatment pt receiving for multiple myeloma.   Glucose / Insulin : no hx of DM. Goal BG <150.  CBG range: 84-115 CBGs and SSI d/c'd on 9/3 due to no insulin  requirement. Electrolytes: WNL including CorrCa Renal: SCr < 1, BUN stable Hepatic: LFTs WNL, alb 2.5; TG 53 Intake / Output; MIVF:  - Output: NG output 1150, urine 1550 mL  - Stool:  x2 occurrence (9/3) GI Imaging: - Admitted from 10/14/23-10/20/23 for symptoms related to SBO.  CT: Transition point suspected in the right hemi-abdomen. Received non-operative management.  - Admitted 11/02/23 - 11/05/2023 for small bowel dilatation/concern for small bowel obstruction. CT imaging showed persistent small bowel dilatation with mild transition point noted deep in pelvis. He went to OR 11/03/23, findings were suspicious for intermittent volvulus of small bowel to explain his symptoms and CT imaging.  - 8/10  CT abdomen shows increasing small bowel dilatation and fecalization of bowel contents involving the jejunum. No definitive transition point is identified at this time but caliber change is noted in the right mid abdomen at the junction of the jejunum and ileum.  GI Surgeries / Procedures:  - 8/1 diagnostic laparoscopy - 8/28: EGD,  enteroscopy - small hiatal hernia, gastritis, duodenum/jejunum appeared normal; several biopsies taken  Central access:  PICC 8/18   Nutritional Goals: Goal TPN rate is 100  mL/hr (provides 132 g of protein and 2438 kcals per day)  RD Assessment:  Estimated Needs Total Energy Estimated Needs: 2400-2600 kcals Total Protein Estimated Needs: 120-135 grams Total Fluid Estimated Needs: >/= 2.4L  Current Nutrition:  NPO and TPN  Plan:  At 18:00 Continue TPN at 100 mL/hr Electrolytes in TPN: No changes today. Na 75 mEq/L  K 50 mEq/L Ca 2.5 mEq/L Mg 5 mEq/L Phos 15 mmol/L  Cl:Ac 1:2 Add standard MVI and trace elements to TPN Monitor TPN labs on Mon/Thurs, BMET with phos & magnesium  tomorrow am    Thank you for allowing pharmacy to be a part of this patient's care.  Eleanor EMERSON Agent, PharmD, BCPS Clinical Pharmacist [place] 12/07/2023 8:01 AM

## 2023-12-07 NOTE — Progress Notes (Signed)
 PROGRESS NOTE    David Mcgrath  FMW:[id number] DOB: 12-04-1950 DOA: 11/12/2023 PCP: [name], DO   Brief Narrative:  73 y.o. male with medical history significant for hypertension, hyperlipidemia, paroxysmal A-fib on Eliquis , chronic anxiety/depression, polyneuropathy, multiple myeloma (diagnosed March 2025) received Bortezomib  infusion 11/09/2023.  Patient presented with worsening abdominal pain and distention noted to have persistent ileus on exam and imaging with continued gastric distention.  General surgery and GI consulted at intake, upper GI series and small bowel follow-through confirmed delayed gastric emptying -notable contrast in the rectum confirms no complete obstruction.  Initially evaluated with endoscopy and exploratory laparoscopy with no clear findings or etiology for patient's symptoms.  At this time patient continues with low intermittent suction via NG tube with TPN for nutrition.  Initial plan for possible IR placement of GJ tube unable to be completed due to poor window.  Will continue supportive care as below in the interim/rule out stool impaction/obstruction. Patient remains high risk for decompensation given poor p.o. intake now for prolonged period time while on TPN but continues to decline in strength -PT OT to follow.   Assessment & Plan:   Principal Problem:   Ileus (HCC) Active Problems:   Hypokalemia   Paroxysmal atrial fibrillation (HCC)   Multiple myeloma (HCC)   Essential hypertension   Depression   Hyperlipidemia   Pressure injury of skin   Protein-calorie malnutrition, severe   Hypernatremia   Swelling of lower extremity   Transaminitis   Acute urinary retention   NSVT (nonsustained ventricular tachycardia) (HCC)  Severe protein calorie malnutrition - Patient remains n.p.o. with NG to low intermittent suction, unable to tolerate even clears earlier this week  - Continues on TPN   Ileus POA, ongoing  Partial small bowel  obstruction previously ruled out Rule out constipation - Imaging continues to confirm ileus, small bowel obstruction unlikely given contrast continues to present to the colon - Large stool burden noted, continue enema per GI - Status post EGD and exploratory laparoscopy without any clear findings to explain imaging or symptoms - Appreciate GI and general surgery insight and recommendations -no further indication for repeat procedure at this time - Continue supportive care as tolerated, on TPN and NG to low intermittent suction as above - IR unable to place GJ tube due to poor windows - Patient did not tolerate erythromycin  trial - Wean narcotics to avoid ongoing/worsening gastroparesis and constipation- morphine  discontinued 9/4 - Transient improvement 8/28-30th with acute decompensation and diminished p.o. intake over the subsequent few days with no real improvement. - Continue supportive care, repeat imaging without overt changes or improvement - Avoid neostigmine per recommendations   Leukocytosis, reactive, resolved -Likely reactive in the setting of profound hypovolemia/hypotension s/p large GI losses on 8/16 and subsequently became hypotensive with elevated lactic acid levels. - Continue to hold antibiotics at this time as no further leukocytosis, patient afebrile - Patient noted to have some orthostatic hypotension on 11/28/2023 while working with PT/OT and advised Ace wrap/TED hose while working with PT.   Diarrhea, resolved Constipation ongoing - Patient noted to have reported semiformed stools. - GI pathogen panel negative. - Patient likely colonized with C. difficile as testing was negative for any active infection (antigen positive, toxin negative, PCR negative). - Patient currently with an ileus/constipation as above   Hypotension - Resolved with hydration early on in the hospitalization. Acute urinary retention, resolved -Foley catheter placed 11/13/2023 -now removed with  appropriate urine output Hypotension- Resolved with hydration early  on in the hospitalization. History of multiple myeloma - Per Dr. Von he was found somewhat patient symptoms thought to be due to chemotherapeutic medications and oncology reengaged to see if they had any further recommendations.   -Outpatient follow-up with oncology recommended   Ambulatory dysfunction, improving  - Continue PT OT, may benefit from ongoing therapy after discharge pending clinical course  Paroxysmal A-fib/NSVT -Prior episodes of nonsustained V. tach and paroxysmal A-fib on telemetry, asymptomatic - Echo 4/24 EF 60 to 65% -no indication to repeat at this time - Continue scheduled metoprolol  IV -no as needed required - Previously on Eliquis  -on hold given recent EGD with biopsy and NPO. status - on heparin  drip  Left finger discoloration, transient, resolved - Unspecified etiology - transient episode of discoloration of left fingers on 8/17-18 that resolved over 48 hours  - Questionable Raynaud's phenomenon - Left upper extremity arterial Dopplers were done with no obstruction of the left upper extremity with triphasic waves. - Patient seen in consultation by vascular surgery who felt no surgical intervention needed at this time.  Normocytic anemia -Patient with no significant overt bleeding. -Status post 2u PRBC (12/05/23)   Multiple electrolyte abnormalities secondary to above Hypovolemic hypernatremia Hypokalemia/hypomagnesemia - Na elevated in the setting of dehydration - continue fluid supplementation, resolved - Patient currently on TPN. - Labs currently improved   Abnormal LFTs - Acute hepatitis panel negative - Complicated by above, improving   Bilateral lower extremity swelling - Negative lower extremity DVT study  - Superficial thrombus left small saphenous vein noted  Lactic acidosis -Resolved.    DVT prophylaxis: Place and maintain sequential compression device Start: 11/16/23  1142 Code Status:   Code Status: Full Code Family Communication: Wife at bedside  Status is: Inpatient  Dispo: The patient is from: Home              Anticipated d/c is to: To be determined              Anticipated d/c date is: To be determined              Patient currently not medically stable for discharge given ongoing need for possible procedure, TPN requirements and NG tube to suction  Consultants:  Gastroenterology: Dr. Kriss 11/19/2023 Vascular surgery: Dr.Robins 11/20/2023 General Surgery Interventional radiology  Procedures:  CT abdomen and pelvis 11/13/2023, 11/16/2023, 12/05/2023 Upper extremity arterial duplex, left 11/21/2023 Bilateral lower extremity Dopplers 11/26/2023 SBO protocol 11/17/2023 PICC line Small bowel enteroscopy/EGD: Per GI: Dr. Rosalie 11/30/2023 Transfused 2 units PRBCs 12/05/2023  Antimicrobials:  None   Subjective: No acute issues/events overnight  Objective: Vitals:   12/07/23 0500 12/07/23 0642 12/07/23 0645 12/07/23 0700  BP:  (!) 159/63    Pulse:   68   Resp:   17   Temp:    98 F (36.7 C)  TempSrc:    Oral  SpO2:   98%   Weight: 80.2 kg     Height:        Intake/Output Summary (Last 24 hours) at 12/07/2023 0701 Last data filed at 12/07/2023 0600 Gross per 24 hour  Intake 3724.99 ml  Output 2700 ml  Net 1024.99 ml   Filed Weights   12/05/23 0500 12/06/23 0500 12/07/23 0500  Weight: 78.9 kg 81.2 kg 80.2 kg    Examination:  General exam: Appears calm and comfortable  Respiratory system: Clear to auscultation. Respiratory effort normal. Cardiovascular system: S1 & S2 heard, RRR. No JVD, murmurs, rubs, gallops or clicks.  No pedal edema. Gastrointestinal system: Abdomen is nondistended, soft and nontender. No organomegaly or masses felt. Normal bowel sounds heard. Central nervous system: Alert and oriented. No focal neurological deficits. Extremities: Symmetric 5 x 5 power. Skin: No rashes, lesions or ulcers Psychiatry: Judgement  and insight appear normal. Mood & affect appropriate.     Data Reviewed: I have personally reviewed following labs and imaging studies  CBC: Recent Labs  Lab 12/04/23 0502 12/04/23 0838 12/04/23 1511 12/05/23 0524 12/05/23 2000 12/06/23 0533 12/07/23 0527  WBC 5.3 6.0  --  5.6  --  8.2 6.9  HGB 8.0* 8.0* 8.6* 7.1* 9.8* 10.5* 9.8*  HCT 24.9* 25.3* 27.3* 22.9* 30.5* 32.9* 31.0*  MCV 98.4 98.1  --  97.0  --  96.2 97.8  PLT 255 269  --  273  --  330 307   Basic Metabolic Panel: Recent Labs  Lab 12/01/23 0430 12/02/23 0500 12/02/23 1817 12/03/23 0911 12/04/23 0502 12/05/23 0524 12/06/23 0533 12/07/23 0527  NA 140 136 133* 134* 135 134* 136 136  K 4.1 4.1 3.9 4.1 3.9 4.2 3.9 4.1  CL 109 106 104 104 103 102 103 103  CO2 23 22 21* 23 23 24 23 24   GLUCOSE 113* 91 98 111* 106* 104* 115* 111*  BUN 29* 32* 30* 25* 22 24* 21 24*  CREATININE 0.44* 0.48* 0.45* 0.48* 0.44* 0.53* 0.48* 0.44*  CALCIUM  7.9* 8.1* 8.0* 8.0* 8.3* 8.1* 8.5* 8.5*  MG 2.2 2.2 2.1 2.0 2.2  --  2.1 2.3  PHOS 2.9 2.8  --   --  3.3  --   --  3.6   GFR: Estimated Creatinine Clearance: 94.7 mL/min (A) (by C-G formula based on SCr of 0.44 mg/dL (L)). Liver Function Tests: Recent Labs  Lab 12/04/23 0502 12/07/23 0527  AST 24 22  ALT 40 33  ALKPHOS 131* 117  BILITOT 0.4 0.7  PROT 5.4* 5.6*  ALBUMIN  2.5* 2.5*   No results for input(s): LIPASE, AMYLASE in the last 168 hours. No results for input(s): AMMONIA in the last 168 hours. Coagulation Profile: No results for input(s): INR, PROTIME in the last 168 hours. Cardiac Enzymes: No results for input(s): CKTOTAL, CKMB, CKMBINDEX, TROPONINI in the last 168 hours. BNP (last 3 results) No results for input(s): PROBNP in the last 8760 hours. HbA1C: No results for input(s): HGBA1C in the last 72 hours. CBG: Recent Labs  Lab 12/05/23 1535 12/05/23 2333 12/06/23 0816 12/06/23 1623 12/06/23 2324  GLUCAP 103* 120* 108* 84 106*    Lipid Profile: No results for input(s): CHOL, HDL, LDLCALC, TRIG, CHOLHDL, LDLDIRECT in the last 72 hours.  Thyroid  Function Tests: No results for input(s): TSH, T4TOTAL, FREET4, T3FREE, THYROIDAB in the last 72 hours. Anemia Panel: Recent Labs    12/05/23 0903  VITAMINB12 984*  FOLATE 8.2  FERRITIN 196  TIBC 185*  IRON 17*   Sepsis Labs: Recent Labs  Lab 12/05/23 0524  LATICACIDVEN 0.7    Recent Results (from the past 240 hours)  Culture, blood (Routine X 2) w Reflex to ID Panel     Status: None (Preliminary result)   Collection Time: 12/05/23  5:24 AM   Specimen: BLOOD RIGHT HAND  Result Value Ref Range Status   Specimen Description   Final    BLOOD RIGHT HAND Performed at [facility], [address] [place], [place] [address]    Special Requests   Final    BOTTLES DRAWN AEROBIC AND ANAEROBIC Blood Culture  results may not be optimal due to an inadequate volume of blood received in culture bottles Performed at [facility], [address], [place], [place] [address]    Culture   Final    NO GROWTH 2 DAYS Performed at [facility], [address] [place], [place] [address]    Report Status PENDING  Incomplete  Culture, blood (Routine X 2) w Reflex to ID Panel     Status: None (Preliminary result)   Collection Time: 12/05/23  5:33 AM   Specimen: BLOOD  Result Value Ref Range Status   Specimen Description   Final    BLOOD SITE NOT SPECIFIED Performed at [facility], [address] [place], [place] [address]    Special Requests   Final    BOTTLES DRAWN AEROBIC ONLY Blood Culture results may not be optimal due to an inadequate volume of blood received in culture bottles Performed at [facility], [address] [place], [place] [address]    Culture   Final    NO GROWTH 2 DAYS Performed at [facility], [address], [place], [place] [address]    Report  Status PENDING  Incomplete         Radiology Studies: DG Abd Portable 1V Result Date: 12/06/2023 CLINICAL DATA:  Ileus. EXAM: PORTABLE ABDOMEN - 1 VIEW COMPARISON:  12/05/2023. FINDINGS: Persistent gaseous distention of small bowel. There is some gas in the descending colon and rectum. Pattern is similar to yesterday's exam. Contrast is seen in the bladder. IMPRESSION: Persistent partial small bowel obstruction. Electronically Signed   By: Newell Eke M.D.   On: 12/06/2023 08:12   CT ABDOMEN PELVIS W CONTRAST Result Date: 12/05/2023 CLINICAL DATA:  Bowel obstruction suspected. EXAM: CT ABDOMEN AND PELVIS WITH CONTRAST TECHNIQUE: Multidetector CT imaging of the abdomen and pelvis was performed using the standard protocol following bolus administration of intravenous contrast. RADIATION DOSE REDUCTION: This exam was performed according to the departmental dose-optimization program which includes automated exposure control, adjustment of the mA and/or kV according to patient size and/or use of iterative reconstruction technique. CONTRAST:  OMNIPAQUE  IOHEXOL  300 MG/ML  SOLN COMPARISON:  CT abdomen and pelvis 11/16/2023. FINDINGS: Lower chest: There are patchy airspace opacities in the bilateral lower lobes, right greater this is similar to prior. Hepatobiliary: No focal liver abnormality is seen. No gallstones, gallbladder wall thickening, or biliary dilatation. Pancreas: Unremarkable. No pancreatic ductal dilatation or surrounding inflammatory changes. Spleen: Normal in size without focal abnormality. Adrenals/Urinary Tract: The bladder wall is trabeculated. The kidneys and adrenal glands are within normal limits. Stomach/Bowel: There are dilated small bowel loops in the right abdomen measuring up to 7 cm. Transition point is seen in the mid abdomen. Distal small bowel and colon are nondilated. Colonic diverticula are present. The colon is nondilated. There is a large amount of stool in the rectum  with some presacral edema and rectal wall thickening. The appendix is not visualized. The stomach is distended despite nasogastric tube tip in the body of the stomach. Vascular/Lymphatic: Aortic atherosclerosis. No enlarged abdominal or pelvic lymph nodes. Reproductive: There are calcifications of the prostate gland. Other: There is no ascites or focal abdominal wall hernia. There is mild body wall edema. Musculoskeletal: Degenerative changes affect the spine and hips. IMPRESSION: 1. Small-bowel obstruction with transition point in the mid abdomen. 2. Large amount of stool in the rectum with rectal wall thickening and presacral edema. Findings are compatible with  stercoral colitis. 3. Colonic diverticulosis. 4. Trabeculated bladder wall. 5. Mild body wall edema. 6. Patchy airspace opacities in the bilateral lower lobes, right greater than left, similar to prior. 7. Aortic atherosclerosis. Aortic Atherosclerosis (ICD10-I70.0). Electronically Signed   By: Greig Pique M.D.   On: 12/05/2023 23:19   DG Abd Portable 1V Result Date: 12/05/2023 CLINICAL DATA:  Ileus. EXAM: PORTABLE ABDOMEN - 1 VIEW COMPARISON:  December 04, 2023. FINDINGS: Nasogastric tube tip is again noted in proximal stomach. Increased small bowel dilatation is seen centrally and right upper quadrant concerning for obstruction possibly ileus. No definite colonic dilatation is noted. IMPRESSION: Increased small bowel dilatation is noted concerning for obstruction or possibly ileus. Electronically Signed   By: Lynwood Landy Raddle M.D.   On: 12/05/2023 13:38        Scheduled Meds:  Chlorhexidine  Gluconate Cloth  6 each Topical Q2200   fluticasone   1 spray Each Nare Daily   insulin  aspart  0-9 Units Subcutaneous Q8H   metoprolol  tartrate  5 mg Intravenous Q8H   pantoprazole  (PROTONIX ) IV  40 mg Intravenous Q12H   sodium chloride  flush  10-40 mL Intracatheter Q12H   Continuous Infusions:  heparin  2,150 Units/hr (12/07/23 0443)   TPN ADULT  (ION) 100 mL/hr at 12/07/23 0443     LOS: 24 days   Time spent:  Elsie JAYSON Montclair, DO [facility]  If 7PM-7AM, please contact night-coverage [email or web address]  12/07/2023, 7:01 AM

## 2023-12-07 NOTE — Plan of Care (Signed)
  Problem: Education: Goal: Knowledge of General Education information will improve Description: Including pain rating scale, medication(s)/side effects and non-pharmacologic comfort measures Outcome: Progressing   Problem: Health Behavior/Discharge Planning: Goal: Ability to manage health-related needs will improve Outcome: Progressing   Problem: Clinical Measurements: Goal: Ability to maintain clinical measurements within normal limits will improve Outcome: Progressing Goal: Will remain free from infection Outcome: Progressing Goal: Diagnostic test results will improve Outcome: Progressing Goal: Respiratory complications will improve Outcome: Progressing Goal: Cardiovascular complication will be avoided Outcome: Progressing   Problem: Activity: Goal: Risk for activity intolerance will decrease Outcome: Progressing   Problem: Nutrition: Goal: Adequate nutrition will be maintained Outcome: Progressing   Problem: Coping: Goal: Level of anxiety will decrease Outcome: Progressing   Problem: Elimination: Goal: Will not experience complications related to bowel motility Outcome: Progressing Goal: Will not experience complications related to urinary retention Outcome: Progressing   Problem: Pain Managment: Goal: General experience of comfort will improve and/or be controlled Outcome: Progressing   Problem: Safety: Goal: Ability to remain free from injury will improve Outcome: Progressing   Problem: Skin Integrity: Goal: Risk for impaired skin integrity will decrease Outcome: Progressing   Problem: Nutrition Goal: Patient maintains adequate hydration Outcome: Progressing Goal: Patient maintains weight Outcome: Progressing Goal: Patient will verbalize dietary restrictions Outcome: Progressing   Problem: Education: Goal: Ability to describe self-care measures that may prevent or decrease complications (Diabetes Survival Skills Education) will improve Outcome:  Progressing Goal: Individualized Educational Video(s) Outcome: Progressing   Problem: Coping: Goal: Ability to adjust to condition or change in health will improve Outcome: Progressing   Problem: Fluid Volume: Goal: Ability to maintain a balanced intake and output will improve Outcome: Progressing   Problem: Health Behavior/Discharge Planning: Goal: Ability to identify and utilize available resources and services will improve Outcome: Progressing Goal: Ability to manage health-related needs will improve Outcome: Progressing   Problem: Metabolic: Goal: Ability to maintain appropriate glucose levels will improve Outcome: Progressing   Problem: Nutritional: Goal: Maintenance of adequate nutrition will improve Outcome: Progressing Goal: Progress toward achieving an optimal weight will improve Outcome: Progressing   Problem: Skin Integrity: Goal: Risk for impaired skin integrity will decrease Outcome: Progressing   Problem: Tissue Perfusion: Goal: Adequacy of tissue perfusion will improve Outcome: Progressing

## 2023-12-08 DIAGNOSIS — K567 Ileus, unspecified: Secondary | ICD-10-CM | POA: Diagnosis not present

## 2023-12-08 LAB — BASIC METABOLIC PANEL WITH GFR
Anion gap: 9 (ref 5–15)
BUN: 24 mg/dL — ABNORMAL HIGH (ref 8–23)
CO2: 24 mmol/L (ref 22–32)
Calcium: 8.5 mg/dL — ABNORMAL LOW (ref 8.9–10.3)
Chloride: 102 mmol/L (ref 98–111)
Creatinine, Ser: 0.47 mg/dL — ABNORMAL LOW (ref 0.61–1.24)
GFR, Estimated: >60 mL/min (ref >60)
Glucose, Bld: 94 mg/dL (ref 70–99)
Potassium: 4.1 mmol/L (ref 3.5–5.1)
Sodium: 135 mmol/L (ref 135–145)

## 2023-12-08 LAB — CBC
HCT: 30.6 % — ABNORMAL LOW (ref 39.0–52.0)
Hemoglobin: 9.8 g/dL — ABNORMAL LOW (ref 13.0–17.0)
MCH: 31.3 pg (ref 26.0–34.0)
MCHC: 32 g/dL (ref 30.0–36.0)
MCV: 97.8 fL (ref 80.0–100.0)
Platelets: 314 K/uL (ref 150–400)
RBC: 3.13 MIL/uL — ABNORMAL LOW (ref 4.22–5.81)
RDW: 17.8 % — ABNORMAL HIGH (ref 11.5–15.5)
WBC: 6 K/uL (ref 4.0–10.5)
nRBC: 0 % (ref 0.0–0.2)

## 2023-12-08 LAB — GLUCOSE, CAPILLARY
Glucose-Capillary: 103 mg/dL — ABNORMAL HIGH (ref 70–99)
Glucose-Capillary: 87 mg/dL (ref 70–99)

## 2023-12-08 LAB — HEPARIN LEVEL (UNFRACTIONATED): Heparin Unfractionated: 0.41 [IU]/mL (ref 0.30–0.70)

## 2023-12-08 LAB — PHOSPHORUS: Phosphorus: 3.7 mg/dL (ref 2.5–4.6)

## 2023-12-08 LAB — MAGNESIUM: Magnesium: 2.1 mg/dL (ref 1.7–2.4)

## 2023-12-08 MED ORDER — ENOXAPARIN SODIUM 80 MG/0.8ML IJ SOSY
1.0000 mg/kg | PREFILLED_SYRINGE | Freq: Two times a day (BID) | INTRAMUSCULAR | Status: DC
  Administered 2023-12-08 – 2023-12-15 (×15): 80 mg via SUBCUTANEOUS
  Filled 2023-12-08 (×15): qty 0.8

## 2023-12-08 MED ORDER — TRAVASOL 10 % IV SOLN
INTRAVENOUS | Status: AC
  Filled 2023-12-08: qty 1320

## 2023-12-08 NOTE — Progress Notes (Signed)
 PHARMACY - TOTAL PARENTERAL NUTRITION CONSULT NOTE   Indication: Bowel Obstruction  Patient Measurements: Height: 6' 4 (193 cm) Weight: 79.8 kg (175 lb 14.8 oz) IBW/kg (Calculated) : 86.8 TPN AdjBW (KG): 94.6 Body mass index is 21.41 kg/m.  Assessment:  Pharmacy is consulted to start TPN on 73 yo male diagnosed with bowel obstruction. This admission CT abdomen shows increasing small bowel dilatation and fecalization of bowel contents involving the jejunum. No definitive transition point is identified at this time but caliber change is noted in the right mid abdomen at the junction of the jejunum and ileum. The more distal ileum is unremarkable. Some suspicion that bowel obstruction may be related to bortezomib  treatment pt receiving for multiple myeloma.   Glucose / Insulin : no hx of DM. Goal BG <150.   - CBGs and SSI d/c'd on 9/3 due to no insulin  requirement. Electrolytes: WNL including CorrCa Renal: SCr < 1, BUN 24 Hepatic: LFTs WNL, alb 2.5; TG 53 (9/1) Intake / Output; MIVF:  - Output: NG output down significantly to , urine 1550 mL  - Stool:  x4 occurrence after enemas (9/4) GI Imaging: - Admitted from 10/14/23-10/20/23 for symptoms related to SBO.  CT: Transition point suspected in the right hemi-abdomen. Received non-operative management.  - Admitted 11/02/23 - 11/05/2023 for small bowel dilatation/concern for small bowel obstruction. CT imaging showed persistent small bowel dilatation with mild transition point noted deep in pelvis. He went to OR 11/03/23, findings were suspicious for intermittent volvulus of small bowel to explain his symptoms and CT imaging.  - 8/10  CT abdomen shows increasing small bowel dilatation and fecalization of bowel contents involving the jejunum. No definitive transition point is identified at this time but caliber change is noted in the right mid abdomen at the junction of the jejunum and ileum.  - 9/4 Abc xray: likely improving ileus GI Surgeries /  Procedures:  - 8/1 diagnostic laparoscopy - 8/28: EGD, enteroscopy - small hiatal hernia, gastritis, duodenum/jejunum appeared normal; several biopsies taken  Central access:  PICC 8/18   Nutritional Goals: Goal TPN rate is 100  mL/hr (provides 132 g of protein and 2438 kcals per day)  RD Assessment:  Estimated Needs Total Energy Estimated Needs: 2400-2600 kcals Total Protein Estimated Needs: 120-135 grams Total Fluid Estimated Needs: >/= 2.4L  Current Nutrition:  NPO and TPN  Plan:  At 18:00 Continue TPN at 100 mL/hr Electrolytes in TPN: No changes today. Na 75 mEq/L  K 50 mEq/L Ca 2.5 mEq/L Mg 5 mEq/L Phos 15 mmol/L  Cl:Ac 1:2 Add standard MVI and trace elements to TPN Monitor TPN labs on Mon/Thurs.  Bmet, mag, phos with AM labs to monitor elytes with change in GI output.    Thank you for allowing pharmacy to be a part of this patient's care.  Wanda Hasting PharmD, BCPS [facility] main pharmacy [phone] 12/08/2023 8:05 AM

## 2023-12-08 NOTE — Progress Notes (Signed)
 Occupational Therapy Treatment Patient Details Name: David Mcgrath MRN: [id number] DOB: Dec 26, 1950 Today's Date: 12/08/2023   History of present illness Pt is 73 yr old male who presented on 11/12/23 with nausea, vomiting, diarrhea, hypotension.  CT raised concern for ileus vs partial SBO. Pt recently admitted 7/31-8/3 for SBO, pt underwent diagnostic laparoscopy on 8/1. This admission pt with persistent ileus, treated with NG tube 8/16 and replaced 8/21.  Surgery consulted and did not feel repeat laparoscopy beneficial.  TPN started 8/18. GI reconsulted for possible EGD but continuing erythromycin  for now. Pt also with hypotension, urinary retention, PAF, hyponatremia/hypokalemia/hypomagnesemia/hypoglycemia.  Pt also with LE edema - findings consistent with chronic DVT.  Pt with hx including but not limited to  hypertension, hyperlipidemia, paroxysmal A-fib on Eliquis , chronic anxiety/depression, polyneuropathy, multiple myeloma (diagnosed March 2025) received Bortezomib  infusion 11/09/2023.   OT comments  The pt was seen for functional strengthening and progression of functional activity, needed to facilitate improved ADL performance. He was found seated in the chair. He reported feelings of fatigue, but was agreeable to participation in the session. He was instructed on BUE and BLE ROM and therapeutic exercises for strengthening needed for progressive ADL performance. He was noted to be with increased proximal BUE weakness, requiring active assist in this regard. He further required mod assist x2 to stand using a RW; two total stands were performed, though pt needed a seated rest break between stands, due to noted fatigue. Initial standing tolerance was ~30 seconds, then ~45 seconds for the second stand. Continue OT plan of care. Post-acute inpatient therapy services are recommended to maximize his independence with meaningful activities and to decrease the risk for further weakness and  deconditioning.       If plan is discharge home, recommend the following:  Two people to help with walking and/or transfers;Two people to help with bathing/dressing/bathroom;Assistance with cooking/housework;Assistance with feeding;Direct supervision/assist for medications management;Direct supervision/assist for financial management;Assist for transportation;Help with stairs or ramp for entrance   Equipment Recommendations  Other (comment) (to be determined pending progress at next setting)    Recommendations for Other Services      Precautions / Restrictions Precautions Precautions: Fall Precaution/Restrictions Comments:  (NG, monitor blood pressure) Restrictions Weight Bearing Restrictions Per Provider Order: No       Mobility Bed Mobility        General bed mobility comments: Pt was received seated in the chair    Transfers Overall transfer level: Needs assistance Equipment used: Rolling walker (2 wheels) Transfers: Sit to/from Stand Sit to Stand: Mod assist, +2 physical assistance        Balance     Sitting balance-Leahy Scale: Fair       Standing balance-Leahy Scale: Poor          ADL either performed or assessed with clinical judgement   ADL       Grooming: Minimal assistance;Sitting Grooming Details (indicate cue type and reason): Proximal BUE weakness noted         Upper Body Dressing : Moderate assistance;Sitting   Lower Body Dressing: Total assistance;Sitting/lateral leans Lower Body Dressing Details (indicate cue type and reason): to donn socks seated in chair                     Communication Communication Communication: No apparent difficulties   Cognition Arousal: Alert Behavior During Therapy: Anxious       Awareness: Online awareness impaired Memory impairment (select all impairments): Short-term memory Attention impairment (  select first level of impairment): Divided attention Executive functioning impairment (select  all impairments): Problem solving          Following commands: Intact Following commands impaired: Only follows one step commands consistently                    Pertinent Vitals/ Pain       Pain Assessment Pain Assessment: 0-10 Pain Score: 4  Pain Location: nasal region around tube Pain Descriptors / Indicators: Grimacing Pain Intervention(s): Monitored during session   Frequency  Min 2X/week        Progress Toward Goals  OT Goals(current goals can now be found in the care plan section)     Acute Rehab OT Goals Patient Stated Goal: to get stronger OT Goal Formulation: With patient/family Time For Goal Achievement: 12/13/23 Potential to Achieve Goals: Good  Plan         AM-PAC OT 6 Clicks Daily Activity     Outcome Measure   Help from another person eating meals?: Total (NPO) Help from another person taking care of personal grooming?: A Little Help from another person toileting, which includes using toliet, bedpan, or urinal?: Total Help from another person bathing (including washing, rinsing, drying)?: A Lot Help from another person to put on and taking off regular upper body clothing?: A Lot Help from another person to put on and taking off regular lower body clothing?: Total 6 Click Score: 10    End of Session Equipment Utilized During Treatment: Rolling walker (2 wheels);Gait belt  OT Visit Diagnosis: Unsteadiness on feet (R26.81);Muscle weakness (generalized) (M62.81);Other abnormalities of gait and mobility (R26.89)   Activity Tolerance Other (comment) (Fair+ tolerance)   Patient Left in chair;with call bell/phone within reach;with chair alarm set   Nurse Communication Mobility status        Time: 1410-1426 OT Time Calculation (min): 16 min  Charges: OT General Charges $OT Visit: 1 Visit OT Treatments $Therapeutic Activity: 8-22 mins     Delanna JINNY Lesches, OTR/L 12/08/2023, 4:28 PM

## 2023-12-08 NOTE — Progress Notes (Signed)
 Physical Therapy Treatment Patient Details Name: David Mcgrath MRN: [id number] DOB: 10-27-1950 Today's Date: 12/08/2023   History of Present Illness Pt is 73 yo male who presented on 11/12/23 with nausea, vomiting, diarrhea, hypotension.  CT raised concern for ileus vs partial SBO.  Pt recently admitted 7/31-8/3 for SBO, pt underwent diagnostic laparoscopy on 8/1.  This admission pt with persistent ileus, treated with NG tube 8/16 and replaced 8/21.  Surgery consulted and did not feel repeat laparoscopy beneficial.  TPN started 8/18. GI reconsulted for possible EGD but continuing erythromycin  for now. Pt also with hypotension, urinary retention, PAF, hyponatremia/hypokalemia/hypomagnesemia/hypoglycemia.  Pt also with LE edema - findings consistent with chronic DVT.  Pt with hx including but not limited to  hypertension, hyperlipidemia, paroxysmal A-fib on Eliquis , chronic anxiety/depression, polyneuropathy, multiple myeloma (diagnosed March 2025) received Bortezomib  infusion 11/09/2023.    PT Comments  Pt seen in step down ICU RM# [id number] Assisted OOB to amb.  General bed mobility comments: increased self ability to transfer to EOB still requiring assist to complete scooting.  Increased fear of falling once EOB.  Nervous.  Required increased time and directions to minimize anxiety. General transfer comment: required Mod Assist + 2 side by side from elevated bed with repeat instructions to decrease Pt's anxiety/fear of falling.  Pt was able to support his weight with static standing while transitioning B UE's onto EVA walker platform.  No c/o dizziness.  Mild discomfort/tenderness at nare with NG tube.  Be careful, stated Pt. General Gait Details: used B platform EVA walker this session to advance gait.  Pt required + 2 side by side assist as he was VERY NERVOUS and Spouse was used to follow with the recliner.  Pt required MAX ENCOURAGEMENT to amb.  INCREASED ANXIETY.  Pt was able to amb 20 feet into  the hallway with short, shuffled steps and reassurance that the chair is right behind you.  Required a 5 min seated rest break before attempting another walker but only went to 2 feet.  Tha'ts enough, stated Pt.  All vitals looked good.  HR avg 124 and RA 98%.  RR did increase to high 30's and Pt did present with 2/4 dyspnea.  Pt grossly deconditioned from his extended hospital stay. Positioned in recliner to comfort. Tolerated session well.      If plan is discharge home, recommend the following: Assistance with cooking/housework;Help with stairs or ramp for entrance;Assist for transportation;Two people to help with walking and/or transfers;A lot of help with bathing/dressing/bathroom   Can travel by private vehicle     No  Equipment Recommendations  None recommended by PT    Recommendations for Other Services       Precautions / Restrictions Precautions Precautions: Fall Precaution/Restrictions Comments: NG drain, watch BP, TEDS Restrictions Weight Bearing Restrictions Per Provider Order: No     Mobility  Bed Mobility Overal bed mobility: Needs Assistance Bed Mobility: Supine to Sit     Supine to sit: Min assist     General bed mobility comments: increased self ability to transfer to EOB still requiring assist to complete scooting.  Increased fear of falling once EOB.  Nervous.  Required increased time and directions to minimize anxiety.    Transfers Overall transfer level: Needs assistance Equipment used: Bilateral platform walker Transfers: Sit to/from Stand Sit to Stand: Mod assist, +2 physical assistance, +2 safety/equipment, From elevated surface           General transfer comment: required Mod Assist +  2 side by side from elevated bed with repeat instructions to decrease Pt's anxiety/fear of falling.  Pt was able to support his weight with static standing while transitioning B UE's onto EVA walker platform.  No c/o dizziness.  Mild discomfort/tenderness at  nare with NG tube.  Be careful, stated Pt.    Ambulation/Gait Ambulation/Gait assistance: Min assist, Mod assist, +2 physical assistance, +2 safety/equipment Gait Distance (Feet): 22 Feet (20 feet + 2 feet) Assistive device: Elyn Finder Gait Pattern/deviations: Trunk flexed, Step-through pattern, Decreased stride length, Decreased dorsiflexion - right, Decreased dorsiflexion - left, Shuffle, Wide base of support Gait velocity: decreased     General Gait Details: used B platform EVA walker this session to advance gait.  Pt required + 2 side by side assist as he was VERY NERVOUS and Spouse was used to follow with the recliner.  Pt required MAX ENCOURAGEMENT to amb.  INCREASED ANXIETY.  Pt was able to amb 20 feet into the hallway with short, shuffled steps and reassurance that the chair is right behind you.  Required a 5 min seated rest break before attempting another walker but only went to 2 feet.  Tha'ts enough, stated Pt.  All vitals looked good.  HR avg 124 and RA 98%.  RR did increase to high 30's and Pt did present with 2/4 dyspnea.  Pt grossly deconditioned from his extended hospital stay.   Stairs             Wheelchair Mobility     Tilt Bed    Modified Rankin (Stroke Patients Only)       Balance                                            Communication    Cognition Arousal: Alert Behavior During Therapy: Anxious   PT - Cognitive impairments: No apparent impairments                       PT - Cognition Comments: AxO x 2.5 following all directions.  Anious/nervous.  Does NOT like to be rushed. Spouse present and supportive. Following commands: Intact Following commands impaired: Only follows one step commands consistently    Cueing    Exercises      General Comments        Pertinent Vitals/Pain Pain Assessment Pain Assessment: Faces Pain Location: nasal region around tube Pain Descriptors / Indicators: Grimacing Pain  Intervention(s): Monitored during session    Home Living                          Prior Function            PT Goals (current goals can now be found in the care plan section) Progress towards PT goals: Progressing toward goals    Frequency    Min 2X/week      PT Plan      Co-evaluation              AM-PAC PT 6 Clicks Mobility   Outcome Measure  Help needed turning from your back to your side while in a flat bed without using bedrails?: A Lot Help needed moving from lying on your back to sitting on the side of a flat bed without using bedrails?: A Lot Help needed moving to and from a bed  to a chair (including a wheelchair)?: A Lot Help needed standing up from a chair using your arms (e.g., wheelchair or bedside chair)?: A Lot Help needed to walk in hospital room?: A Lot Help needed climbing 3-5 steps with a railing? : Total 6 Click Score: 11    End of Session Equipment Utilized During Treatment: Gait belt Activity Tolerance: Patient limited by fatigue Patient left: in chair;with call bell/phone within reach;with chair alarm set;with family/visitor present Nurse Communication: Mobility status PT Visit Diagnosis: Unsteadiness on feet (R26.81);Other abnormalities of gait and mobility (R26.89);Muscle weakness (generalized) (M62.81)     Time: 8995-8970 PT Time Calculation (min) (ACUTE ONLY): 25 min  Charges:    $Gait Training: 8-22 mins $Therapeutic Activity: 8-22 mins PT General Charges $$ ACUTE PT VISIT: 1 Visit                     Katheryn Leap  PTA Acute  Rehabilitation Services Office M-F          [phone]

## 2023-12-08 NOTE — Progress Notes (Signed)
 Inpatient Rehab Coordinator Note:  I met with patient and his wife, Vertell, at bedside to discuss CIR recommendations and goals/expectations of CIR stay.  We reviewed 3 hrs/day of therapy, physician follow up, and average length of stay 2 weeks (dependent upon progress) with goals of supervision.  We discussed current barriers to rehab are NGT to suction and TPN.  I will continue to follow and will see him next week.   Reche Lowers, PT, DPT Admissions Coordinator [phone] 12/08/23  4:01 PM

## 2023-12-08 NOTE — Progress Notes (Signed)
 PROGRESS NOTE    David Mcgrath  FMW:[id number] DOB: 1950-11-02 DOA: 11/12/2023 PCP: [name], DO   Brief Narrative:  73 y.o. male with medical history significant for hypertension, hyperlipidemia, paroxysmal A-fib on Eliquis , chronic anxiety/depression, polyneuropathy, multiple myeloma (diagnosed March 2025) received Bortezomib  infusion 11/09/2023.  Patient presented with worsening abdominal pain and distention noted to have persistent ileus on exam and imaging with continued gastric distention.  General surgery and GI consulted at intake, upper GI series and small bowel follow-through confirmed delayed gastric emptying -notable contrast in the rectum confirms no complete obstruction.  Initially evaluated with endoscopy and exploratory laparoscopy with no clear findings or etiology for patient's symptoms.  At this time patient continues with low intermittent suction via NG tube with TPN for nutrition.  Initial plan for possible IR placement of GJ tube unable to be completed due to poor window.  Will continue supportive care as below in the interim/rule out stool impaction/obstruction. Patient remains high risk for decompensation given poor p.o. intake now for prolonged period time while on TPN - PT/OT to follow.  Assessment & Plan:   Principal Problem:   Ileus (HCC) Active Problems:   Hypokalemia   Paroxysmal atrial fibrillation (HCC)   Multiple myeloma (HCC)   Essential hypertension   Depression   Hyperlipidemia   Pressure injury of skin   Protein-calorie malnutrition, severe   Hypernatremia   Swelling of lower extremity   Transaminitis   Acute urinary retention   NSVT (nonsustained ventricular tachycardia) (HCC)  Severe protein calorie malnutrition - Patient remains n.p.o. with NG to low intermittent suction, unable to tolerate even clears earlier this week  - Continues on TPN   Ileus POA, ongoing  Partial small bowel obstruction previously ruled out Rule out  constipation - Imaging continues to confirm ileus, small bowel obstruction unlikely given contrast continues to present to the colon - Large stool burden noted on imaging, continue enema per GI - multiple BMs over the past 24h - Status post EGD and exploratory laparoscopy without any clear findings to explain imaging or symptoms - Appreciate GI and general surgery insight and recommendations -no further indication for repeat procedure at this time - Continue supportive care as tolerated, on TPN and NG to low intermittent suction as above - IR unable to place GJ tube due to poor windows - Patient did not tolerate erythromycin  trial - Wean narcotics to avoid ongoing/worsening gastroparesis and constipation- morphine  discontinued 9/4 - Transient improvement 8/28-30th with acute decompensation and diminished p.o. intake over the subsequent few days with no real improvement. - Avoid neostigmine per recommendations   Leukocytosis, reactive, resolved -Likely reactive in the setting of profound hypovolemia/hypotension s/p large GI losses on 8/16 and subsequently became hypotensive with elevated lactic acid levels. - Continue to hold antibiotics at this time as no further leukocytosis, patient afebrile - Patient noted to have some orthostatic hypotension on 11/28/2023 while working with PT/OT and advised Ace wrap/TED hose while working with PT.   Diarrhea, resolved Constipation ongoing - Patient noted to have reported semiformed stools. - GI pathogen panel negative. - Patient likely colonized with C. difficile as testing was negative for any active infection (antigen positive, toxin negative, PCR negative). - Patient currently with an ileus/constipation as above   Hypotension - Resolved with hydration early on in the hospitalization. Acute urinary retention, resolved -Foley catheter placed 11/13/2023 -now removed with appropriate urine output Hypotension- Resolved with hydration early on in the  hospitalization. History of multiple myeloma -  Per Dr. Von he was found somewhat patient symptoms thought to be due to chemotherapeutic medications and oncology reengaged to see if they had any further recommendations.   -Outpatient follow-up with oncology recommended   Ambulatory dysfunction, improving  - Continue PT OT, may benefit from ongoing therapy after discharge pending clinical course  Paroxysmal A-fib/NSVT -Prior episodes of nonsustained V. tach and paroxysmal A-fib on telemetry, asymptomatic - Echo 4/24 EF 60 to 65% -no indication to repeat at this time - Continue scheduled metoprolol  IV -no as needed required - Previously on Eliquis  -on hold given recent EGD with biopsy and NPO. status - on heparin  drip  Left finger discoloration, transient, resolved - Unspecified etiology - transient episode of discoloration of left fingers on 8/17-18 that resolved over 48 hours  - Questionable Raynaud's phenomenon - Left upper extremity arterial Dopplers were done with no obstruction of the left upper extremity with triphasic waves. - Patient seen in consultation by vascular surgery who felt no surgical intervention needed at this time.  Normocytic anemia -Patient with no significant overt bleeding. -Status post 2u PRBC (12/05/23)   Multiple electrolyte abnormalities secondary to above Hypovolemic hypernatremia Hypokalemia/hypomagnesemia - Na elevated in the setting of dehydration - continue fluid supplementation, resolved - Patient currently on TPN. - Labs currently improved   Abnormal LFTs - Acute hepatitis panel negative - Complicated by above, improving   Bilateral lower extremity swelling - Negative lower extremity DVT study  - Superficial thrombus left small saphenous vein noted  Lactic acidosis -Resolved.    DVT prophylaxis: Place and maintain sequential compression device Start: 11/16/23 1142 Code Status:   Code Status: Full Code Family Communication: Wife at  bedside  Status is: Inpatient  Dispo: The patient is from: Home              Anticipated d/c is to: To be determined              Anticipated d/c date is: To be determined              Patient currently not medically stable for discharge given ongoing need for possible procedure, TPN requirements and NG tube to suction  Consultants:  Gastroenterology: Dr. Kriss 11/19/2023 Vascular surgery: Dr.Robins 11/20/2023 General Surgery Interventional radiology  Procedures:  CT abdomen and pelvis 11/13/2023, 11/16/2023, 12/05/2023 Upper extremity arterial duplex, left 11/21/2023 Bilateral lower extremity Dopplers 11/26/2023 SBO protocol 11/17/2023 PICC line Small bowel enteroscopy/EGD: Per GI: Dr. Rosalie 11/30/2023 Transfused 2 units PRBCs 12/05/2023  Antimicrobials:  None   Subjective: No acute issues/events overnight -increasing activity with therapy, tolerating TPN quite well, NG tube output diminished overnight compared to previous.  Objective: Vitals:   12/08/23 0600 12/08/23 0620 12/08/23 0700 12/08/23 0745  BP: (!) 113/49 100/83 (!) 109/48   Pulse: 76 86 83 69  Resp: 14 18 (!) 22 (!) 28  Temp:      TempSrc:      SpO2: 98% 98% 98% 99%  Weight:      Height:        Intake/Output Summary (Last 24 hours) at 12/08/2023 0816 Last data filed at 12/08/2023 0745 Gross per 24 hour  Intake 3275.93 ml  Output 2325 ml  Net 950.93 ml   Filed Weights   12/06/23 0500 12/07/23 0500 12/08/23 0426  Weight: 81.2 kg 80.2 kg 79.8 kg    Examination:  General:  Pleasantly resting in bed, No acute distress. HEENT: NG tube noted in the right nare, sclerae nonicteric, noninjected.  Extraocular movements  intact bilaterally. Neck:  Without mass or deformity.  Trachea is midline. Lungs:  Clear to auscultate bilaterally without rhonchi, wheeze, or rales. Heart:  Regular rate and rhythm.  Without murmurs, rubs, or gallops. Abdomen:  Soft, nontender, nondistended.  Without guarding or  rebound. Extremities: Without cyanosis, clubbing, edema, or obvious deformity. Skin:  Warm and dry, no erythema.  Data Reviewed: I have personally reviewed following labs and imaging studies  CBC: Recent Labs  Lab 12/04/23 0838 12/04/23 1511 12/05/23 0524 12/05/23 2000 12/06/23 0533 12/07/23 0527 12/08/23 0423  WBC 6.0  --  5.6  --  8.2 6.9 6.0  HGB 8.0*   < > 7.1* 9.8* 10.5* 9.8* 9.8*  HCT 25.3*   < > 22.9* 30.5* 32.9* 31.0* 30.6*  MCV 98.1  --  97.0  --  96.2 97.8 97.8  PLT 269  --  273  --  330 307 314   < > = values in this interval not displayed.   Basic Metabolic Panel: Recent Labs  Lab 12/02/23 0500 12/02/23 1817 12/03/23 0911 12/04/23 0502 12/05/23 0524 12/06/23 0533 12/07/23 0527 12/08/23 0423  NA 136   < > 134* 135 134* 136 136 135  K 4.1   < > 4.1 3.9 4.2 3.9 4.1 4.1  CL 106   < > 104 103 102 103 103 102  CO2 22   < > 23 23 24 23 24 24   GLUCOSE 91   < > 111* 106* 104* 115* 111* 94  BUN 32*   < > 25* 22 24* 21 24* 24*  CREATININE 0.48*   < > 0.48* 0.44* 0.53* 0.48* 0.44* 0.47*  CALCIUM  8.1*   < > 8.0* 8.3* 8.1* 8.5* 8.5* 8.5*  MG 2.2   < > 2.0 2.2  --  2.1 2.3 2.1  PHOS 2.8  --   --  3.3  --   --  3.6 3.7   < > = values in this interval not displayed.   GFR: Estimated Creatinine Clearance: 94.2 mL/min (A) (by C-G formula based on SCr of 0.47 mg/dL (L)). Liver Function Tests: Recent Labs  Lab 12/04/23 0502 12/07/23 0527  AST 24 22  ALT 40 33  ALKPHOS 131* 117  BILITOT 0.4 0.7  PROT 5.4* 5.6*  ALBUMIN  2.5* 2.5*   CBG: Recent Labs  Lab 12/06/23 2324 12/07/23 0756 12/07/23 1539 12/07/23 2334 12/08/23 0756  GLUCAP 106* 105* 93 92 103*   Anemia Panel: Recent Labs    12/05/23 0903  VITAMINB12 984*  FOLATE 8.2  FERRITIN 196  TIBC 185*  IRON 17*   Sepsis Labs: Recent Labs  Lab 12/05/23 0524  LATICACIDVEN 0.7    Recent Results (from the past 240 hours)  Culture, blood (Routine X 2) w Reflex to ID Panel     Status: None  (Preliminary result)   Collection Time: 12/05/23  5:24 AM   Specimen: BLOOD RIGHT HAND  Result Value Ref Range Status   Specimen Description   Final    BLOOD RIGHT HAND Performed at [facility], [address] [place], [place] [address]    Special Requests   Final    BOTTLES DRAWN AEROBIC AND ANAEROBIC Blood Culture results may not be optimal due to an inadequate volume of blood received in culture bottles Performed at [facility], [address], [place], [place] [address]    Culture   Final    NO GROWTH 3 DAYS Performed at [facility],  [address], [place], [place] [address]    Report Status PENDING  Incomplete  Culture, blood (Routine X 2) w Reflex to ID Panel     Status: None (Preliminary result)   Collection Time: 12/05/23  5:33 AM   Specimen: BLOOD  Result Value Ref Range Status   Specimen Description   Final    BLOOD SITE NOT SPECIFIED Performed at [facility], [address] [place], [place] [address]    Special Requests   Final    BOTTLES DRAWN AEROBIC ONLY Blood Culture results may not be optimal due to an inadequate volume of blood received in culture bottles Performed at [facility], [address] [place], [place] [address]    Culture   Final    NO GROWTH 3 DAYS Performed at [facility], [address] [place], [place] [address]    Report Status PENDING  Incomplete    Radiology Studies: DG Abd 1 View Result Date: 12/07/2023 CLINICAL DATA:  Ileus EXAM: ABDOMEN - 1 VIEW COMPARISON:  Abdominal radiograph dated 12/06/2023 FINDINGS: Gastric/enteric tube tip projects over the stomach. Decreased size of gas-filled bowel loops throughout the abdomen. No free air or pneumatosis. No abnormal radio-opaque calculi or mass effect. No acute or substantial osseous abnormality. The sacrum and coccyx are partially obscured by overlying bowel contents. IMPRESSION: Decreased size of gas-filled bowel  loops throughout the abdomen, likely improving ileus. Electronically Signed   By: Limin  Xu M.D.   On: 12/07/2023 15:29   Scheduled Meds:  Chlorhexidine  Gluconate Cloth  6 each Topical Q2200   fluticasone   1 spray Each Nare Daily   insulin  aspart  0-9 Units Subcutaneous Q8H   metoprolol  tartrate  5 mg Intravenous Q8H   pantoprazole  (PROTONIX ) IV  40 mg Intravenous Q12H   sodium chloride  flush  10-40 mL Intracatheter Q12H   Continuous Infusions:  heparin  2,300 Units/hr (12/08/23 0745)   TPN ADULT (ION) 100 mL/hr at 12/08/23 0745     LOS: 25 days   Time spent:  David JAYSON Montclair, DO [facility]  If 7PM-7AM, please contact night-coverage [email or web address]  12/08/2023, 8:16 AM

## 2023-12-08 NOTE — Progress Notes (Signed)
 [facility] Gastroenterology Progress Note  Zaron Zwiefelhofer III 73 y.o. [birth year]   Subjective: Feels ok. Denies abdominal pain. Watery stools yesterday and today. Sitting in bedside chair. Updated wife by phone.  Objective: Vital signs: Vitals:   12/08/23 1200 12/08/23 1300  BP: 133/78 122/66  Pulse: 86 94  Resp: (!) 31 (!) 33  Temp:    SpO2: 100% 100%  T 97.6  Physical Exam: Gen: lethargic, elderly, well-nourished, no acute distress, pleasant HEENT: anicteric sclera CV: RRR Chest: CTA B Abd: soft, nontender, nondistended, +BS Ext: no edema  Lab Results: Recent Labs    12/07/23 0527 12/08/23 0423  NA 136 135  K 4.1 4.1  CL 103 102  CO2 24 24  GLUCOSE 111* 94  BUN 24* 24*  CREATININE 0.44* 0.47*  CALCIUM  8.5* 8.5*  MG 2.3 2.1  PHOS 3.6 3.7   Recent Labs    12/07/23 0527  AST 22  ALT 33  ALKPHOS 117  BILITOT 0.7  PROT 5.6*  ALBUMIN  2.5*   Recent Labs    12/07/23 0527 12/08/23 0423  WBC 6.9 6.0  HGB 9.8* 9.8*  HCT 31.0* 30.6*  MCV 97.8 97.8  PLT 307 314      Assessment/Plan: Resolving ileus - recheck Xray tomorrow. NG drainage decreasing. Consider clamping NGT in next 1-2 days. Dr. Rosalie will f/u tomorrow.   [name] 12/08/2023, 1:53 PM  Questions please call [phone] ID: Norleen Mardy Sieving [name], male   DOB: March 02, 1951, 73 y.o.   MRN: [id number]

## 2023-12-08 NOTE — Progress Notes (Signed)
 PHARMACY - ANTICOAGULATION CONSULT NOTE  Pharmacy Consult for heparin  Indication: atrial fibrillation, VTE  Allergies  Allergen Reactions   Shrimp [Shellfish Allergy] Anaphylaxis and Swelling    Swelling throat    Reglan  [Metoclopramide ] Other (See Comments)    caused tardive dyskinesia    Patient Measurements: Height: 6' 4 (193 cm) Weight: 79.8 kg (175 lb 14.8 oz) IBW/kg (Calculated) : 86.8 HEPARIN  DW (KG): 94.6  Vital Signs: Temp: 97.7 F (36.5 C) (09/05 0421) Temp Source: Axillary (09/05 0421) BP: 146/59 (09/05 0000) Pulse Rate: 84 (09/05 0000)  Labs: Recent Labs    12/05/23 0524 12/05/23 2000 12/06/23 0533 12/07/23 0527 12/07/23 1810 12/08/23 0423  HGB 7.1*   < > 10.5* 9.8*  --  9.8*  HCT 22.9*   < > 32.9* 31.0*  --  30.6*  PLT 273  --  330 307  --  314  HEPARINUNFRC 0.30  --  0.37 0.23* 0.27* 0.41  CREATININE 0.53*  --  0.48* 0.44*  --   --    < > = values in this interval not displayed.    Estimated Creatinine Clearance: 94.2 mL/min (A) (by C-G formula based on SCr of 0.44 mg/dL (L)).   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  Patient reports he does not have symptoms as of 02/25/14.     Diarrhea 06/04/2023   Essential hypertension 06/04/2023   Fatty liver 06/04/2023   Flu 06/04/2023   Hyperlipidemia 06/04/2023   Ileus (HCC) 06/04/2023   Multiple myeloma (HCC)    Neuropathy    Paroxysmal atrial fibrillation (HCC) 07/04/2023   Pneumonia 06/04/2023     Assessment: 73 year old male with afib on Eliquis  PTA which is currently on hold. Patient recently admitted for bowel obstruction and discharged home after conservative treatment with NGT decompression. He presented back with similar concerns, is currently on TPN.  Pharmacy is consulted for heparin  drip while apixaban  remains on hold. Doppler with bilateral chronic DVT.   Significant Events: - 8/24 Doppler: + right common femoral chronic DVT, + left common femoral chronic DVT, + left age indeterminate superficial vein thrombosis - 8/27: Several nosebleeds overnight. Op note today mentions mild NGT trauma. NG removed.  - 8/28: UFH off at 06:00 for enteroscopy.  UFH resumed at 17:03 - 8/31 New bright red blood noted in NGT tubing (see RN note), heparin  level noted to be therapeutic  Today, 12/08/23 HL 0.41 therapeutic on 2300 units/hr Hgb 9.8, plts 314 No complications of therapy noted   Goal of Therapy:  Heparin  level 0.3-0.7 units/ml Monitor platelets by anticoagulation protocol: Yes   Plan:  continue IV heparin  infusion at 2300 units/hr  Confirmatory level in 8 hours CBC, heparin  level daily Monitor for signs of bleeding   Thank you for allowing pharmacy to be a part of this patient's care.  Leeroy Mace RPh 12/08/2023, 5:01 AM

## 2023-12-09 ENCOUNTER — Inpatient Hospital Stay (HOSPITAL_COMMUNITY)

## 2023-12-09 DIAGNOSIS — K567 Ileus, unspecified: Secondary | ICD-10-CM | POA: Diagnosis not present

## 2023-12-09 LAB — BASIC METABOLIC PANEL WITH GFR
Anion gap: 11 (ref 5–15)
BUN: 25 mg/dL — ABNORMAL HIGH (ref 8–23)
CO2: 24 mmol/L (ref 22–32)
Calcium: 8.7 mg/dL — ABNORMAL LOW (ref 8.9–10.3)
Chloride: 103 mmol/L (ref 98–111)
Creatinine, Ser: 0.54 mg/dL — ABNORMAL LOW (ref 0.61–1.24)
GFR, Estimated: >60 mL/min (ref >60)
Glucose, Bld: 98 mg/dL (ref 70–99)
Potassium: 4.3 mmol/L (ref 3.5–5.1)
Sodium: 138 mmol/L (ref 135–145)

## 2023-12-09 LAB — CBC
HCT: 31.3 % — ABNORMAL LOW (ref 39.0–52.0)
Hemoglobin: 9.7 g/dL — ABNORMAL LOW (ref 13.0–17.0)
MCH: 30.5 pg (ref 26.0–34.0)
MCHC: 31 g/dL (ref 30.0–36.0)
MCV: 98.4 fL (ref 80.0–100.0)
Platelets: 334 K/uL (ref 150–400)
RBC: 3.18 MIL/uL — ABNORMAL LOW (ref 4.22–5.81)
RDW: 17.5 % — ABNORMAL HIGH (ref 11.5–15.5)
WBC: 5.8 K/uL (ref 4.0–10.5)
nRBC: 0 % (ref 0.0–0.2)

## 2023-12-09 LAB — PHOSPHORUS: Phosphorus: 4.2 mg/dL (ref 2.5–4.6)

## 2023-12-09 LAB — MAGNESIUM: Magnesium: 2.2 mg/dL (ref 1.7–2.4)

## 2023-12-09 MED ORDER — SODIUM CHLORIDE 0.9% FLUSH
10.0000 mL | Freq: Two times a day (BID) | INTRAVENOUS | Status: DC
  Administered 2023-12-09 – 2023-12-20 (×23): 10 mL

## 2023-12-09 MED ORDER — TRAVASOL 10 % IV SOLN
INTRAVENOUS | Status: AC
  Filled 2023-12-09: qty 1320

## 2023-12-09 MED ORDER — KETOROLAC TROMETHAMINE 15 MG/ML IJ SOLN
15.0000 mg | Freq: Once | INTRAMUSCULAR | Status: AC
  Administered 2023-12-09: 15 mg via INTRAVENOUS
  Filled 2023-12-09: qty 1

## 2023-12-09 MED ORDER — SIMETHICONE 40 MG/0.6ML PO SUSP
40.0000 mg | Freq: Four times a day (QID) | ORAL | Status: DC | PRN
  Administered 2023-12-09 – 2023-12-13 (×2): 40 mg via ORAL
  Filled 2023-12-09 (×4): qty 0.6

## 2023-12-09 MED ORDER — LACTULOSE ENEMA
300.0000 mL | Freq: Two times a day (BID) | ORAL | Status: DC | PRN
  Administered 2023-12-09: 300 mL via RECTAL
  Filled 2023-12-09 (×3): qty 300

## 2023-12-09 MED ORDER — SODIUM CHLORIDE 0.9% FLUSH: 10.0000 mL | INTRAVENOUS | Status: DC | PRN

## 2023-12-09 MED ORDER — LINACLOTIDE 145 MCG PO CAPS
145.0000 ug | ORAL_CAPSULE | Freq: Every day | ORAL | Status: DC
  Administered 2023-12-09: 145 ug
  Filled 2023-12-09: qty 1

## 2023-12-09 MED ORDER — LINACLOTIDE 145 MCG PO CAPS
145.0000 ug | ORAL_CAPSULE | Freq: Every day | ORAL | Status: DC
  Filled 2023-12-09: qty 1

## 2023-12-09 MED ORDER — MORPHINE SULFATE (PF) 2 MG/ML IV SOLN
2.0000 mg | INTRAVENOUS | Status: DC | PRN
  Administered 2023-12-09 – 2023-12-19 (×12): 2 mg via INTRAVENOUS
  Filled 2023-12-09 (×15): qty 1

## 2023-12-09 NOTE — Progress Notes (Signed)
 PHARMACY - TOTAL PARENTERAL NUTRITION CONSULT NOTE   Indication: Prolonged ileus  Patient Measurements: Height: 6' 4 (193 cm) Weight: 79.8 kg (175 lb 14.8 oz) IBW/kg (Calculated) : 86.8 TPN AdjBW (KG): 94.6 Body mass index is 21.41 kg/m.  Assessment:  Pharmacy is consulted to start TPN on 73 yo male diagnosed with bowel obstruction. This admission CT abdomen shows increasing small bowel dilatation and fecalization of bowel contents involving the jejunum. No definitive transition point is identified at this time but caliber change is noted in the right mid abdomen at the junction of the jejunum and ileum. The more distal ileum is unremarkable. Some suspicion that bowel obstruction may be related to bortezomib  treatment pt receiving for multiple myeloma.   Glucose / Insulin : no hx of DM. Goal BG 140-180 - CBGs and SSI d/c'd on 9/3 due to no insulin  requirement. Electrolytes: WNL including CorrCa Renal: SCr < 1, BUN 25 Hepatic: LFTs WNL, alb 2.5; TG 53 (9/1) Intake / Output; MIVF:  - Output: NG output , urine 1300 mL  - Stool:  x4 occurrence after enemas (9/4) GI Imaging: - Admitted from 10/14/23-10/20/23 for symptoms related to SBO.  CT: Transition point suspected in the right hemi-abdomen. Received non-operative management.  - Admitted 11/02/23 - 11/05/2023 for small bowel dilatation/concern for small bowel obstruction. CT imaging showed persistent small bowel dilatation with mild transition point noted deep in pelvis. He went to OR 11/03/23, findings were suspicious for intermittent volvulus of small bowel to explain his symptoms and CT imaging.  - 8/10  CT abdomen shows increasing small bowel dilatation and fecalization of bowel contents involving the jejunum. No definitive transition point - 9/4 Abd XR: likely improving ileus - 9/6 Abd XR: concerning for ileus or possibly obstruction GI Surgeries / Procedures:  - 8/1 diagnostic laparoscopy - 8/28: EGD, enteroscopy - small hiatal  hernia, gastritis, duodenum/jejunum appeared normal; several biopsies taken  Central access:  PICC 8/18   Nutritional Goals: Goal TPN rate is 100  mL/hr (provides 132 g of protein and 2438 kcals per day)  RD Assessment:  Estimated Needs Total Energy Estimated Needs: 2400-2600 kcals Total Protein Estimated Needs: 120-135 grams Total Fluid Estimated Needs: >/= 2.4L  Current Nutrition:  NPO and TPN  Plan:  At 18:00 Continue TPN at 100 mL/hr Electrolytes in TPN:  Na 75 mEq/L  K 50 mEq/L Ca 2.5 mEq/L Mg 5 mEq/L Phos 10 mmol/L  Cl:Ac 1:2 Add standard MVI and trace elements to TPN Monitor TPN labs on Mon/Thurs.  Bmet, mag, phos with AM labs to monitor elytes with change in GI output.    Thank you for allowing pharmacy to be a part of this patient's care.  Wanda Hasting PharmD, BCPS [facility] main pharmacy [phone] 12/09/2023 7:23 AM

## 2023-12-09 NOTE — Plan of Care (Signed)
  Problem: Pain Managment: Goal: General experience of comfort will improve and/or be controlled Outcome: Progressing   Problem: Nutritional: Goal: Maintenance of adequate nutrition will improve Outcome: Progressing   Problem: Skin Integrity: Goal: Risk for impaired skin integrity will decrease Outcome: Progressing   Problem: Tissue Perfusion: Goal: Adequacy of tissue perfusion will improve Outcome: Progressing

## 2023-12-09 NOTE — Progress Notes (Signed)
 PROGRESS NOTE    David Mcgrath  FMW:[id number] DOB: [birth year] DOA: 11/12/2023 PCP: [name], DO   Brief Narrative:  73 y.o. male with medical history significant for hypertension, hyperlipidemia, paroxysmal A-fib on Eliquis , chronic anxiety/depression, polyneuropathy, multiple myeloma (diagnosed March 2025) received Bortezomib  infusion 11/09/2023.  Patient presented with worsening abdominal pain and distention noted to have persistent ileus on exam and imaging with continued gastric distention.  General surgery and GI consulted at intake, upper GI series and small bowel follow-through confirmed delayed gastric emptying -notable contrast in the rectum confirms no complete obstruction.  Initially evaluated with endoscopy and exploratory laparoscopy with no clear findings or etiology for patient's symptoms.  At this time patient continues with low intermittent suction via NG tube with TPN for nutrition.  Initial plan for possible IR placement of GJ tube unable to be completed due to poor window.  Will continue supportive care as below in the interim/rule out stool impaction/obstruction. Patient remains high risk for decompensation given poor p.o. intake now for prolonged period time while on TPN - PT/OT to follow.   Assessment & Plan:   Principal Problem:   Ileus (HCC) Active Problems:   Hypokalemia   Paroxysmal atrial fibrillation (HCC)   Multiple myeloma (HCC)   Essential hypertension   Depression   Hyperlipidemia   Pressure injury of skin   Protein-calorie malnutrition, severe   Hypernatremia   Swelling of lower extremity   Transaminitis   Acute urinary retention   NSVT (nonsustained ventricular tachycardia) (HCC)  Severe protein calorie malnutrition - Patient remains n.p.o. with NG to low intermittent suction, unable to tolerate even clears earlier this week  - Continues on TPN   Ileus POA, ongoing  Partial small bowel obstruction previously ruled out Rule out  constipation - Imaging continues to confirm ileus, small bowel obstruction unlikely given contrast continues to present to the colon - Large stool burden noted on imaging, continue enema PRN - plain films confirm ongoing large stool burden - continue suppository/enema - Status post EGD and exploratory laparoscopy without any clear findings to explain imaging or symptoms - Appreciate GI and general surgery insight and recommendations -no further indication for repeat procedure at this time - Continue supportive care as tolerated, on TPN and NG to low intermittent suction as above - IR unable to place GJ tube due to poor windows - Patient did not tolerate erythromycin  trial - Wean narcotics to avoid ongoing/worsening gastroparesis and constipation- morphine  discontinued 9/4 - Avoid neostigmine per recommendations from GI; did not tolerate linzess /reglan  due to side effects(profound cramping and tardive dyskenesia respectively)   Leukocytosis, reactive, resolved -Likely reactive in the setting of profound hypovolemia/hypotension s/p large GI losses on 8/16 and subsequently became hypotensive with elevated lactic acid levels. - Continue to hold antibiotics at this time as no further leukocytosis, patient afebrile - Patient noted to have some orthostatic hypotension on 11/28/2023 while working with PT/OT and advised Ace wrap/TED hose while working with PT.   Diarrhea, resolved Constipation ongoing - Patient noted to have reported semiformed stools. - GI pathogen panel negative. - Patient likely colonized with C. difficile as testing was negative for any active infection (antigen positive, toxin negative, PCR negative). - Patient currently with an ileus/constipation as above   Hypotension - Resolved with hydration early on in the hospitalization. Acute urinary retention, resolved -Foley catheter placed 11/13/2023 -now removed with appropriate urine output Hypotension- Resolved with hydration early  on in the hospitalization. History of multiple myeloma - Per  Dr. Von he was found somewhat patient symptoms thought to be due to chemotherapeutic medications and oncology reengaged to see if they had any further recommendations.   -Outpatient follow-up with oncology recommended   Ambulatory dysfunction, improving  - Continue PT OT, may benefit from ongoing therapy after discharge pending clinical course  Paroxysmal A-fib/NSVT -Prior episodes of nonsustained V. tach and paroxysmal A-fib on telemetry, asymptomatic - Echo 4/24 EF 60 to 65% -no indication to repeat at this time - Continue scheduled metoprolol  IV -no as needed required - Previously on Eliquis  -on hold given recent EGD with biopsy and NPO. status - on heparin  drip  Left finger discoloration, transient, resolved - Unspecified etiology - transient episode of discoloration of left fingers on 8/17-18 that resolved over 48 hours  - Questionable Raynaud's phenomenon - Left upper extremity arterial Dopplers were done with no obstruction of the left upper extremity with triphasic waves. - Patient seen in consultation by vascular surgery who felt no surgical intervention needed at this time.  Normocytic anemia -Patient with no significant overt bleeding. -Status post 2u PRBC (12/05/23)   Multiple electrolyte abnormalities secondary to above Hypovolemic hypernatremia Hypokalemia/hypomagnesemia - Na elevated in the setting of dehydration - continue fluid supplementation, resolved - Patient currently on TPN. - Labs currently improved   Abnormal LFTs - Acute hepatitis panel negative - Complicated by above, improving   Bilateral lower extremity swelling - Negative lower extremity DVT study  - Superficial thrombus left small saphenous vein noted  Lactic acidosis -Resolved.    DVT prophylaxis: Place and maintain sequential compression device Start: 11/16/23 1142 Code Status:   Code Status: Full Code Family Communication: Wife  at bedside  Status is: Inpatient  Dispo: The patient is from: Home              Anticipated d/c is to: To be determined              Anticipated d/c date is: To be determined              Patient currently not medically stable for discharge given ongoing need for possible procedure, TPN requirements and NG tube to suction  Consultants:  Gastroenterology: Dr. Kriss 11/19/2023 Vascular surgery: Dr.Robins 11/20/2023 General Surgery Interventional radiology  Procedures:  CT abdomen and pelvis 11/13/2023, 11/16/2023, 12/05/2023 Upper extremity arterial duplex, left 11/21/2023 Bilateral lower extremity Dopplers 11/26/2023 SBO protocol 11/17/2023 PICC line Small bowel enteroscopy/EGD: Per GI: Dr. Rosalie 11/30/2023 Transfused 2 units PRBCs 12/05/2023  Antimicrobials:  None   Subjective: No acute issues/events overnight -increasing activity with therapy, tolerating TPN quite well, NG tube output diminished overnight compared to previous.  Objective: Vitals:   12/08/23 1822 12/08/23 2218 12/09/23 0245 12/09/23 0614  BP: 137/76 (!) 141/78 (!) 115/54 117/62  Pulse: 98 89 91 92  Resp: 20 18 18 18   Temp:  (!) 97.5 F (36.4 C) 98.2 F (36.8 C) 98.2 F (36.8 C)  TempSrc: Oral Oral Oral Oral  SpO2: 98% 99% 98% 98%  Weight:      Height:        Intake/Output Summary (Last 24 hours) at 12/09/2023 0807 Last data filed at 12/09/2023 0600 Gross per 24 hour  Intake 2289.2 ml  Output 1525 ml  Net 764.2 ml   Filed Weights   12/06/23 0500 12/07/23 0500 12/08/23 0426  Weight: 81.2 kg 80.2 kg 79.8 kg    Examination:  General:  Pleasantly resting in bed, No acute distress. HEENT: NG tube noted in the right  nare, sclerae nonicteric, noninjected.  Extraocular movements intact bilaterally. Neck:  Without mass or deformity.  Trachea is midline. Lungs:  Clear to auscultate bilaterally without rhonchi, wheeze, or rales. Heart:  Regular rate and rhythm.  Without murmurs, rubs, or gallops. Abdomen:   Soft, nontender, nondistended.  Without guarding or rebound. Extremities: Without cyanosis, clubbing, edema, or obvious deformity. Skin:  Warm and dry, no erythema.  Data Reviewed: I have personally reviewed following labs and imaging studies  CBC: Recent Labs  Lab 12/05/23 0524 12/05/23 2000 12/06/23 0533 12/07/23 0527 12/08/23 0423 12/09/23 0617  WBC 5.6  --  8.2 6.9 6.0 5.8  HGB 7.1* 9.8* 10.5* 9.8* 9.8* 9.7*  HCT 22.9* 30.5* 32.9* 31.0* 30.6* 31.3*  MCV 97.0  --  96.2 97.8 97.8 98.4  PLT 273  --  330 307 314 334   Basic Metabolic Panel: Recent Labs  Lab 12/04/23 0502 12/05/23 0524 12/06/23 0533 12/07/23 0527 12/08/23 0423 12/09/23 0617  NA 135 134* 136 136 135 138  K 3.9 4.2 3.9 4.1 4.1 4.3  CL 103 102 103 103 102 103  CO2 23 24 23 24 24 24   GLUCOSE 106* 104* 115* 111* 94 98  BUN 22 24* 21 24* 24* 25*  CREATININE 0.44* 0.53* 0.48* 0.44* 0.47* 0.54*  CALCIUM  8.3* 8.1* 8.5* 8.5* 8.5* 8.7*  MG 2.2  --  2.1 2.3 2.1 2.2  PHOS 3.3  --   --  3.6 3.7 4.2   GFR: Estimated Creatinine Clearance: 94.2 mL/min (A) (by C-G formula based on SCr of 0.54 mg/dL (L)). Liver Function Tests: Recent Labs  Lab 12/04/23 0502 12/07/23 0527  AST 24 22  ALT 40 33  ALKPHOS 131* 117  BILITOT 0.4 0.7  PROT 5.4* 5.6*  ALBUMIN  2.5* 2.5*   CBG: Recent Labs  Lab 12/07/23 0756 12/07/23 1539 12/07/23 2334 12/08/23 0756 12/08/23 1544  GLUCAP 105* 93 92 103* 87   Anemia Panel: No results for input(s): VITAMINB12, FOLATE, FERRITIN, TIBC, IRON, RETICCTPCT in the last 72 hours.  Sepsis Labs: Recent Labs  Lab 12/05/23 0524  LATICACIDVEN 0.7    Recent Results (from the past 240 hours)  Culture, blood (Routine X 2) w Reflex to ID Panel     Status: None (Preliminary result)   Collection Time: 12/05/23  5:24 AM   Specimen: BLOOD RIGHT HAND  Result Value Ref Range Status   Specimen Description   Final    BLOOD RIGHT HAND Performed at [facility], [address] [place], [place] [address]    Special Requests   Final    BOTTLES DRAWN AEROBIC AND ANAEROBIC Blood Culture results may not be optimal due to an inadequate volume of blood received in culture bottles Performed at [facility], [address], [place], [place] [address]    Culture   Final    NO GROWTH 3 DAYS Performed at [facility], [address] [place], [place] [address]    Report Status PENDING  Incomplete  Culture, blood (Routine X 2) w Reflex to ID Panel     Status: None (Preliminary result)   Collection Time: 12/05/23  5:33 AM   Specimen: BLOOD  Result Value Ref Range Status   Specimen Description   Final    BLOOD SITE NOT SPECIFIED Performed at [facility], [address] [place], [place] [address]    Special Requests   Final    BOTTLES DRAWN AEROBIC ONLY Blood Culture results  may not be optimal due to an inadequate volume of blood received in culture bottles Performed at [facility], [address], [place], [place] [address]    Culture   Final    NO GROWTH 3 DAYS Performed at [facility], [address] [place], [place] [address]    Report Status PENDING  Incomplete    Radiology Studies: DG Abd 1 View Result Date: 12/07/2023 CLINICAL DATA:  Ileus EXAM: ABDOMEN - 1 VIEW COMPARISON:  Abdominal radiograph dated 12/06/2023 FINDINGS: Gastric/enteric tube tip projects over the stomach. Decreased size of gas-filled bowel loops throughout the abdomen. No free air or pneumatosis. No abnormal radio-opaque calculi or mass effect. No acute or substantial osseous abnormality. The sacrum and coccyx are partially obscured by overlying bowel contents. IMPRESSION: Decreased size of gas-filled bowel loops throughout the abdomen, likely improving ileus. Electronically Signed   By: Limin  Xu M.D.   On: 12/07/2023 15:29   Scheduled Meds:  Chlorhexidine  Gluconate Cloth  6 each Topical Q2200   enoxaparin  (LOVENOX )  injection  1 mg/kg Subcutaneous Q12H   fluticasone   1 spray Each Nare Daily   metoprolol  tartrate  5 mg Intravenous Q8H   pantoprazole  (PROTONIX ) IV  40 mg Intravenous Q12H   sodium chloride  flush  10-40 mL Intracatheter Q12H   Continuous Infusions:  TPN ADULT (ION) 100 mL/hr at 12/08/23 1823     LOS: 26 days   Time spent:  Elsie JAYSON [place], DO [facility]  If 7PM-7AM, please contact night-coverage [email or web address]  12/09/2023, 8:07 AM

## 2023-12-09 NOTE — Progress Notes (Signed)
 David Mcgrath 10:11 AM  Subjective: Patient seen and examined and case discussed with the hospital team and his wife as well as my partner Dr. Dianna and he is in his usual state of health and last saw him without any complaints and no new problems unfortunately he still has NG  Objective: Vital signs stable afebrile no acute distress abdomen is soft nontender NG output actually not too bad  Assessment: Chronic ileus  Plan: Agree with increasing activity the hospital team will look into [employer] to see if available at the hospital to try and possibly as an outpatient if family is willing to try getting Motolin from a [place] pharmacy and he may need a PEG with a jejunal port for feeding although that may not work and I will check on tomorrow but maybe give him another trial of clamping NG in the near future [facility]  office [phone] After 5PM or if no answer call [phone]

## 2023-12-09 NOTE — Progress Notes (Addendum)
 Pt complaining of feeling really weak, lightheaded and Hypoxic. Pt de sated to 76% on RA while attempting to get back in bed from bedside commode, Tachycardic at 120.  Rapid response nurse called and at the bedside. Patient helped back to bed. VSS HR 89, Sats 93 % on 2 liters. Pt on continuous telemetry and pulse ox. MD [facility] notified & pt left in stable condition.   1800-Right PICC Line dressing due to be changed and dressing change completed, cap changed. TPN is continuously running at 153ml/hr through Left PICC.

## 2023-12-10 DIAGNOSIS — K567 Ileus, unspecified: Secondary | ICD-10-CM | POA: Diagnosis not present

## 2023-12-10 LAB — BASIC METABOLIC PANEL WITH GFR
Anion gap: 9 (ref 5–15)
BUN: 32 mg/dL — ABNORMAL HIGH (ref 8–23)
CO2: 25 mmol/L (ref 22–32)
Calcium: 7.6 mg/dL — ABNORMAL LOW (ref 8.9–10.3)
Chloride: 105 mmol/L (ref 98–111)
Creatinine, Ser: 0.59 mg/dL — ABNORMAL LOW (ref 0.61–1.24)
GFR, Estimated: >60 mL/min (ref >60)
Glucose, Bld: 110 mg/dL — ABNORMAL HIGH (ref 70–99)
Potassium: 4.4 mmol/L (ref 3.5–5.1)
Sodium: 140 mmol/L (ref 135–145)

## 2023-12-10 LAB — CBC
HCT: 29.4 % — ABNORMAL LOW (ref 39.0–52.0)
Hemoglobin: 9.5 g/dL — ABNORMAL LOW (ref 13.0–17.0)
MCH: 31.9 pg (ref 26.0–34.0)
MCHC: 32.3 g/dL (ref 30.0–36.0)
MCV: 98.7 fL (ref 80.0–100.0)
Platelets: 321 K/uL (ref 150–400)
RBC: 2.98 MIL/uL — ABNORMAL LOW (ref 4.22–5.81)
RDW: 17.3 % — ABNORMAL HIGH (ref 11.5–15.5)
WBC: 5.1 K/uL (ref 4.0–10.5)
nRBC: 0 % (ref 0.0–0.2)

## 2023-12-10 LAB — CULTURE, BLOOD (ROUTINE X 2)
Culture: NO GROWTH
Culture: NO GROWTH

## 2023-12-10 LAB — MAGNESIUM: Magnesium: 2.3 mg/dL (ref 1.7–2.4)

## 2023-12-10 LAB — PHOSPHORUS: Phosphorus: 4.3 mg/dL (ref 2.5–4.6)

## 2023-12-10 MED ORDER — TRAVASOL 10 % IV SOLN
INTRAVENOUS | Status: AC
  Filled 2023-12-10: qty 1320

## 2023-12-10 MED ORDER — STERILE WATER FOR INJECTION IJ SOLN
INTRAMUSCULAR | Status: AC
  Administered 2023-12-10: 10 mL
  Filled 2023-12-10: qty 10

## 2023-12-10 NOTE — Progress Notes (Signed)
 PROGRESS NOTE    David Mcgrath  FMW:[id number] DOB: 10/19/1950 DOA: 11/12/2023 PCP: [name], DO   Brief Narrative:  73 y.o. male with medical history significant for hypertension, hyperlipidemia, paroxysmal A-fib on Eliquis , chronic anxiety/depression, polyneuropathy, multiple myeloma (diagnosed March 2025) received Bortezomib  infusion 11/09/2023.  Patient presented with worsening abdominal pain and distention noted to have persistent ileus on exam and imaging with continued gastric distention.  General surgery and GI consulted at intake, upper GI series and small bowel follow-through confirmed delayed gastric emptying -notable contrast in the rectum confirms no complete obstruction.  Initially evaluated with endoscopy and exploratory laparoscopy with no clear findings or etiology for patient's symptoms.  At this time patient continues with low intermittent suction via NG tube with TPN for nutrition.  Initial plan for possible IR placement of GJ tube unable to be completed due to poor window.  Will continue supportive care as below in the interim/rule out stool impaction/obstruction. Patient remains high risk for decompensation given poor p.o. intake now for prolonged period time while on TPN - PT/OT to follow.   Assessment & Plan:   Principal Problem:   Ileus (HCC) Active Problems:   Hypokalemia   Paroxysmal atrial fibrillation (HCC)   Multiple myeloma (HCC)   Essential hypertension   Depression   Hyperlipidemia   Pressure injury of skin   Protein-calorie malnutrition, severe   Hypernatremia   Swelling of lower extremity   Transaminitis   Acute urinary retention   NSVT (nonsustained ventricular tachycardia) (HCC)  Severe protein calorie malnutrition - Patient remains n.p.o. with NG to low intermittent suction, unable to tolerate even clears earlier this week  - Continues on TPN   Ileus POA, ongoing  Partial small bowel obstruction previously ruled out Rule out  constipation - Imaging continues to confirm ileus, small bowel obstruction unlikely given contrast continues to present to the colon - Large stool burden noted on imaging, continue enema PRN - plain films confirm ongoing large stool burden - continue suppository/enema - Status post EGD and exploratory laparoscopy without any clear findings to explain imaging or symptoms - Appreciate GI and general surgery insight and recommendations -no further indication for repeat procedure at this time - Continue supportive care as tolerated, on TPN and NG to low intermittent suction as above - IR unable to place GJ tube due to poor windows - Patient did not tolerate erythromycin  trial - Wean narcotics to avoid ongoing/worsening gastroparesis and constipation- morphine  discontinued 9/4 - Avoid neostigmine per recommendations from GI; did not tolerate reglan  due to side effects(tardive dyskenesia) - Noted acute onset abdominal pain 9/6 with large bowel movement after Linzess  administration.  Plan to repeat today as well as clamping NG trial however patient wishes to take a day off given his eventful day yesterday. -Current plan discussed with GI: clamping NG trial tomorrow as well as increased p.o. motility agents including Linzess , MiraLAX , Senokot and continue rectal suppositories and enema as needed to maintain soft to loose bowel movements   Leukocytosis, reactive, resolved -Likely reactive in the setting of profound hypovolemia/hypotension s/p large GI losses on 8/16 and subsequently became hypotensive with elevated lactic acid levels. - Continue to hold antibiotics at this time as no further leukocytosis, patient afebrile - Patient noted to have some orthostatic hypotension on 11/28/2023 while working with PT/OT and advised Ace wrap/TED hose while working with PT.   Constipation ongoing but improving Diarrhea, resolved - Patient noted to have reported semiformed stools. - GI pathogen panel negative. -  Patient likely colonized with C. difficile as testing was negative for any active infection (antigen positive, toxin negative, PCR negative). - Patient currently with an ileus/constipation as above - continue treatment as above   Hypotension - Resolved with hydration early on in the hospitalization. Acute urinary retention, resolved -Foley catheter placed 11/13/2023 -now removed with appropriate urine output Hypotension- Resolved with hydration early on in the hospitalization. History of multiple myeloma - Per Dr. Von he was found somewhat patient symptoms thought to be due to chemotherapeutic medications and oncology reengaged to see if they had any further recommendations.   -Outpatient follow-up with oncology recommended   Ambulatory dysfunction, improving  - Continue PT OT, may benefit from ongoing therapy after discharge pending clinical course  Paroxysmal A-fib/NSVT -Prior episodes of nonsustained V. tach and paroxysmal A-fib on telemetry, asymptomatic - Echo 4/24 EF 60 to 65% -no indication to repeat at this time - Continue scheduled metoprolol  IV -no as needed required - Previously on Eliquis  -on hold given recent EGD with biopsy and NPO. status - on heparin  drip  Left finger discoloration, transient, resolved - Unspecified etiology - transient episode of discoloration of left fingers on 8/17-18 that resolved over 48 hours  - Questionable Raynaud's phenomenon - Left upper extremity arterial Dopplers were done with no obstruction of the left upper extremity with triphasic waves. - Patient seen in consultation by vascular surgery who felt no surgical intervention needed at this time.  Normocytic anemia -Patient with no significant overt bleeding. -Status post 2u PRBC (12/05/23)   Multiple electrolyte abnormalities secondary to above Hypovolemic hypernatremia Hypokalemia/hypomagnesemia - Na elevated in the setting of dehydration - continue fluid supplementation, resolved - Patient  currently on TPN. - Labs currently improved   Abnormal LFTs - Acute hepatitis panel negative - Complicated by above, improving   Bilateral lower extremity swelling, resolved - Negative lower extremity DVT study - Superficial thrombus left small saphenous vein noted  Lactic acidosis - Resolved.    DVT prophylaxis: Place and maintain sequential compression device Start: 11/16/23 1142 Code Status:   Code Status: Full Code Family Communication: Wife at bedside  Status is: Inpatient  Dispo: The patient is from: Home              Anticipated d/c is to: To be determined              Anticipated d/c date is: To be determined              Patient currently not medically stable for discharge given ongoing need for possible procedure, TPN requirements and NG tube to suction  Consultants:  Gastroenterology: Dr. Kriss 11/19/2023 Vascular surgery: Dr.Robins 11/20/2023 General Surgery Interventional radiology  Procedures:  CT abdomen and pelvis 11/13/2023, 11/16/2023, 12/05/2023 Upper extremity arterial duplex, left 11/21/2023 Bilateral lower extremity Dopplers 11/26/2023 SBO protocol 11/17/2023 PICC line Small bowel enteroscopy/EGD: Per GI: Dr. Rosalie 11/30/2023 Transfused 2 units PRBCs 12/05/2023  Antimicrobials:  None   Subjective: No acute issues/events overnight -continue increasing activity with therapy, tolerating TPN quite well, NG tube output diminished overnight compared to previous.  Objective: Vitals:   12/09/23 1306 12/09/23 1647 12/09/23 2144 12/10/23 0431  BP: 138/72 (!) 133/121 106/62 (!) 115/56  Pulse: 100 98 98 93  Resp: 18  18 18   Temp: 97.9 F (36.6 C) (!) 97.5 F (36.4 C) 98 F (36.7 C) (!) 97.5 F (36.4 C)  TempSrc: Oral Oral Oral Oral  SpO2:  (!) 87% 98% 99%  Weight:  Height:        Intake/Output Summary (Last 24 hours) at 12/10/2023 0815 Last data filed at 12/10/2023 0615 Gross per 24 hour  Intake 1939.19 ml  Output 650 ml  Net 1289.19 ml   Filed  Weights   12/06/23 0500 12/07/23 0500 12/08/23 0426  Weight: 81.2 kg 80.2 kg 79.8 kg    Examination:  General:  Pleasantly resting in bed, No acute distress. HEENT: NG tube noted in the right nare, sclerae nonicteric, noninjected.  Extraocular movements intact bilaterally. Neck:  Without mass or deformity.  Trachea is midline. Lungs:  Clear to auscultate bilaterally without rhonchi, wheeze, or rales. Heart:  Regular rate and rhythm.  Without murmurs, rubs, or gallops. Abdomen:  Soft, nontender, nondistended.  Without guarding or rebound. Extremities: Without cyanosis, clubbing, edema, or obvious deformity. Skin:  Warm and dry, no erythema.  Data Reviewed: I have personally reviewed following labs and imaging studies  CBC: Recent Labs  Lab 12/06/23 0533 12/07/23 0527 12/08/23 0423 12/09/23 0617 12/10/23 0628  WBC 8.2 6.9 6.0 5.8 5.1  HGB 10.5* 9.8* 9.8* 9.7* 9.5*  HCT 32.9* 31.0* 30.6* 31.3* 29.4*  MCV 96.2 97.8 97.8 98.4 98.7  PLT 330 307 314 334 321   Basic Metabolic Panel: Recent Labs  Lab 12/04/23 0502 12/05/23 0524 12/06/23 0533 12/07/23 0527 12/08/23 0423 12/09/23 0617 12/10/23 0628  NA 135   < > 136 136 135 138 140  K 3.9   < > 3.9 4.1 4.1 4.3 4.4  CL 103   < > 103 103 102 103 105  CO2 23   < > 23 24 24 24 25   GLUCOSE 106*   < > 115* 111* 94 98 110*  BUN 22   < > 21 24* 24* 25* 32*  CREATININE 0.44*   < > 0.48* 0.44* 0.47* 0.54* 0.59*  CALCIUM  8.3*   < > 8.5* 8.5* 8.5* 8.7* 7.6*  MG 2.2  --  2.1 2.3 2.1 2.2 2.3  PHOS 3.3  --   --  3.6 3.7 4.2 4.3   < > = values in this interval not displayed.   GFR: Estimated Creatinine Clearance: 94.2 mL/min (A) (by C-G formula based on SCr of 0.59 mg/dL (L)). Liver Function Tests: Recent Labs  Lab 12/04/23 0502 12/07/23 0527  AST 24 22  ALT 40 33  ALKPHOS 131* 117  BILITOT 0.4 0.7  PROT 5.4* 5.6*  ALBUMIN  2.5* 2.5*   CBG: Recent Labs  Lab 12/07/23 0756 12/07/23 1539 12/07/23 2334 12/08/23 0756  12/08/23 1544  GLUCAP 105* 93 92 103* 87   Anemia Panel: No results for input(s): VITAMINB12, FOLATE, FERRITIN, TIBC, IRON, RETICCTPCT in the last 72 hours.  Sepsis Labs: Recent Labs  Lab 12/05/23 0524  LATICACIDVEN 0.7    Recent Results (from the past 240 hours)  Culture, blood (Routine X 2) w Reflex to ID Panel     Status: None (Preliminary result)   Collection Time: 12/05/23  5:24 AM   Specimen: BLOOD RIGHT HAND  Result Value Ref Range Status   Specimen Description   Final    BLOOD RIGHT HAND Performed at [facility], [address] [place], [place] [address]    Special Requests   Final    BOTTLES DRAWN AEROBIC AND ANAEROBIC Blood Culture results may not be optimal due to an inadequate volume of blood received in culture bottles Performed at [facility], [address], [place], [place] [address]    Culture  Final    NO GROWTH 4 DAYS Performed at [facility], [address], [place], [place] [address]    Report Status PENDING  Incomplete  Culture, blood (Routine X 2) w Reflex to ID Panel     Status: None (Preliminary result)   Collection Time: 12/05/23  5:33 AM   Specimen: BLOOD  Result Value Ref Range Status   Specimen Description   Final    BLOOD SITE NOT SPECIFIED Performed at [facility], [address], [place], [place] [address]    Special Requests   Final    BOTTLES DRAWN AEROBIC ONLY Blood Culture results may not be optimal due to an inadequate volume of blood received in culture bottles Performed at [facility], [address], [place], [place] [address]    Culture   Final    NO GROWTH 4 DAYS Performed at [facility], [address] [place], [place] [address]    Report Status PENDING  Incomplete    Radiology Studies: DG Abd 1 View Result Date: 12/09/2023 CLINICAL DATA:  Abdominal distension, ileus EXAM: ABDOMEN - 1 VIEW COMPARISON:  12/09/2023 at 8:01 a.m.  FINDINGS: Two supine frontal views of the abdomen and pelvis are obtained. There is nonspecific gaseous distension of the large and small bowel, with decreased caliber of the small bowel dilatation seen previously. Findings may reflect resolving obstruction or ileus. No masses or abnormal calcifications. Enteric catheter tip projects over the gastric fundus. IMPRESSION: 1. Nonspecific gaseous distension of the large and small bowel, with decreased caliber since prior study, which may reflect resolving obstruction or ileus. Electronically Signed   By: Ozell Daring M.D.   On: 12/09/2023 18:21   DG Abd 1 View Result Date: 12/09/2023 CLINICAL DATA:  Ileus, diarrhea. EXAM: ABDOMEN - 1 VIEW COMPARISON:  December 07, 2003. FINDINGS: Mildly dilated small bowel loops are noted in left side of abdomen. Nasogastric tube tip is seen in proximal stomach. No colonic dilatation. IMPRESSION: Mildly dilated small bowel loops are noted in left side of abdomen concerning for ileus or possibly obstruction. Electronically Signed   By: Lynwood Landy Raddle M.D.   On: 12/09/2023 09:30   Scheduled Meds:  Chlorhexidine  Gluconate Cloth  6 each Topical Q2200   enoxaparin  (LOVENOX ) injection  1 mg/kg Subcutaneous Q12H   fluticasone   1 spray Each Nare Daily   metoprolol  tartrate  5 mg Intravenous Q8H   pantoprazole  (PROTONIX ) IV  40 mg Intravenous Q12H   sodium chloride  flush  10-40 mL Intracatheter Q12H   sodium chloride  flush  10-40 mL Intracatheter Q12H   Continuous Infusions:  TPN ADULT (ION) 100 mL/hr at 12/09/23 2019     LOS: 27 days   Time spent:  Elsie JAYSON Montclair, DO [facility]  If 7PM-7AM, please contact night-coverage [email or web address]  12/10/2023, 8:15 AM

## 2023-12-10 NOTE — Plan of Care (Signed)
  Problem: Education: Goal: Knowledge of General Education information will improve Description: Including pain rating scale, medication(s)/side effects and non-pharmacologic comfort measures Outcome: Progressing   Problem: Health Behavior/Discharge Planning: Goal: Ability to manage health-related needs will improve Outcome: Progressing   Problem: Clinical Measurements: Goal: Ability to maintain clinical measurements within normal limits will improve Outcome: Progressing Goal: Will remain free from infection Outcome: Progressing Goal: Diagnostic test results will improve Outcome: Progressing Goal: Respiratory complications will improve Outcome: Progressing Goal: Cardiovascular complication will be avoided Outcome: Progressing   Problem: Activity: Goal: Risk for activity intolerance will decrease Outcome: Progressing   Problem: Nutrition: Goal: Adequate nutrition will be maintained Outcome: Progressing   Problem: Coping: Goal: Level of anxiety will decrease Outcome: Progressing   Problem: Elimination: Goal: Will not experience complications related to bowel motility Outcome: Progressing Goal: Will not experience complications related to urinary retention Outcome: Progressing   Problem: Pain Managment: Goal: General experience of comfort will improve and/or be controlled Outcome: Progressing   Problem: Safety: Goal: Ability to remain free from injury will improve Outcome: Progressing   Problem: Skin Integrity: Goal: Risk for impaired skin integrity will decrease Outcome: Progressing   Problem: Nutrition Goal: Patient maintains adequate hydration Outcome: Progressing Goal: Patient maintains weight Outcome: Progressing   Problem: Coping: Goal: Ability to adjust to condition or change in health will improve Outcome: Progressing   Problem: Fluid Volume: Goal: Ability to maintain a balanced intake and output will improve Outcome: Progressing   Problem: Health  Behavior/Discharge Planning: Goal: Ability to identify and utilize available resources and services will improve Outcome: Progressing Goal: Ability to manage health-related needs will improve Outcome: Progressing   Problem: Nutritional: Goal: Maintenance of adequate nutrition will improve Outcome: Progressing Goal: Progress toward achieving an optimal weight will improve Outcome: Progressing   Problem: Skin Integrity: Goal: Risk for impaired skin integrity will decrease Outcome: Progressing   Problem: Tissue Perfusion: Goal: Adequacy of tissue perfusion will improve Outcome: Progressing

## 2023-12-10 NOTE — Progress Notes (Signed)
 PHARMACY - TOTAL PARENTERAL NUTRITION CONSULT NOTE   Indication: Prolonged ileus  Patient Measurements: Height: 6' 4 (193 cm) Weight: 79.8 kg (175 lb 14.8 oz) IBW/kg (Calculated) : 86.8 TPN AdjBW (KG): 94.6 Body mass index is 21.41 kg/m.  Assessment:  Pharmacy is consulted to start TPN on 73 yo male diagnosed with bowel obstruction. This admission CT abdomen shows increasing small bowel dilatation and fecalization of bowel contents involving the jejunum. No definitive transition point is identified at this time but caliber change is noted in the right mid abdomen at the junction of the jejunum and ileum. The more distal ileum is unremarkable. Some suspicion that bowel obstruction may be related to bortezomib  treatment pt receiving for multiple myeloma.   Glucose / Insulin : no hx of DM. Goal BG 140-180.  CBGs and SSI d/c'd on 9/3. Electrolytes: WNL but Na, K, Mag, Phos all trending up. CorrCa 8.8 Renal: SCr < 1, BUN 32 Hepatic: LFTs WNL, alb 2.5; TG 53 (9/1) Intake / Output; MIVF:  - Output: NG output down to , urine 150 mL (likely missing urine output charting per RN) - Stool:  x4 occurrence after enemas (9/4), x1 on 9/6 GI Imaging: - Admitted from 10/14/23-10/20/23 for symptoms related to SBO.  CT: Transition point suspected in the right hemi-abdomen. Received non-operative management.  - Admitted 11/02/23 - 11/05/2023 for small bowel dilatation/concern for small bowel obstruction. CT imaging showed persistent small bowel dilatation with mild transition point noted deep in pelvis. He went to OR 11/03/23, findings were suspicious for intermittent volvulus of small bowel to explain his symptoms and CT imaging.  - 8/10  CT abdomen shows increasing small bowel dilatation and fecalization of bowel contents involving the jejunum. No definitive transition point - 9/4 Abd XR: likely improving ileus - 9/6 Abd XR: concerning for ileus or possibly obstruction GI Surgeries / Procedures:  - 8/1  diagnostic laparoscopy - 8/28: EGD, enteroscopy - small hiatal hernia, gastritis, duodenum/jejunum appeared normal; several biopsies taken  Central access:  PICC 8/18   Nutritional Goals: Goal TPN rate is 100  mL/hr (provides 132 g of protein and 2438 kcals per day)  RD Assessment:  Estimated Needs Total Energy Estimated Needs: 2400-2600 kcals Total Protein Estimated Needs: 120-135 grams Total Fluid Estimated Needs: >/= 2.4L  Current Nutrition:  NPO and TPN  Plan:  At 18:00 Continue TPN at 100 mL/hr Electrolytes in TPN:  Na 50 mEq/L  K 40 mEq/L Ca 2.5 mEq/L Mg 2.5 mEq/L Phos 5 mmol/L  Cl:Ac 1:2 Add standard MVI and trace elements to TPN Monitor TPN labs on Mon/Thurs.  Bmet, mag, phos with AM labs to monitor elytes with change in GI/urine output.    Thank you for allowing pharmacy to be a part of this patient's care.  Wanda Hasting PharmD, BCPS [facility] main pharmacy [phone] 12/10/2023 7:59 AM

## 2023-12-10 NOTE — Progress Notes (Signed)
 David Mcgrath 9:39 AM  Subjective: Patient seen and examined and case discussed with the hospital team and he has no new complaints and unfortunately pharmacy unable to get Motegrity but they tried Linzess  which we discussed  Objective: Vital signs stable afebrile no acute distress abdomen is soft nontender NG output not as much as in the past  Assessment: Chronic ileus some constipation failure to thrive  Plan: Would give him 1 more chance at clamping NG and seeing how he does and would allow clear liquids during that time and will asked rounding team to check on next week  [facility]  office [phone] After 5PM or if no answer call [phone]

## 2023-12-10 NOTE — Plan of Care (Signed)
  Problem: Clinical Measurements: Goal: Diagnostic test results will improve Outcome: Progressing   Problem: Pain Managment: Goal: General experience of comfort will improve and/or be controlled Outcome: Progressing   Problem: Health Behavior/Discharge Planning: Goal: Ability to manage health-related needs will improve Outcome: Progressing   Problem: Metabolic: Goal: Ability to maintain appropriate glucose levels will improve Outcome: Progressing   Problem: Tissue Perfusion: Goal: Adequacy of tissue perfusion will improve Outcome: Progressing

## 2023-12-11 ENCOUNTER — Inpatient Hospital Stay (HOSPITAL_COMMUNITY)

## 2023-12-11 DIAGNOSIS — K567 Ileus, unspecified: Secondary | ICD-10-CM | POA: Diagnosis not present

## 2023-12-11 LAB — CBC
HCT: 30.3 % — ABNORMAL LOW (ref 39.0–52.0)
Hemoglobin: 9.4 g/dL — ABNORMAL LOW (ref 13.0–17.0)
MCH: 30.6 pg (ref 26.0–34.0)
MCHC: 31 g/dL (ref 30.0–36.0)
MCV: 98.7 fL (ref 80.0–100.0)
Platelets: 345 K/uL (ref 150–400)
RBC: 3.07 MIL/uL — ABNORMAL LOW (ref 4.22–5.81)
RDW: 17.2 % — ABNORMAL HIGH (ref 11.5–15.5)
WBC: 5.3 K/uL (ref 4.0–10.5)
nRBC: 0 % (ref 0.0–0.2)

## 2023-12-11 LAB — COMPREHENSIVE METABOLIC PANEL WITH GFR
ALT: 24 U/L (ref 0–44)
AST: 25 U/L (ref 15–41)
Albumin: 2.6 g/dL — ABNORMAL LOW (ref 3.5–5.0)
Alkaline Phosphatase: 135 U/L — ABNORMAL HIGH (ref 38–126)
Anion gap: 9 (ref 5–15)
BUN: 30 mg/dL — ABNORMAL HIGH (ref 8–23)
CO2: 26 mmol/L (ref 22–32)
Calcium: 8.7 mg/dL — ABNORMAL LOW (ref 8.9–10.3)
Chloride: 104 mmol/L (ref 98–111)
Creatinine, Ser: 0.58 mg/dL — ABNORMAL LOW (ref 0.61–1.24)
GFR, Estimated: >60 mL/min (ref >60)
Glucose, Bld: 103 mg/dL — ABNORMAL HIGH (ref 70–99)
Potassium: 4.4 mmol/L (ref 3.5–5.1)
Sodium: 139 mmol/L (ref 135–145)
Total Bilirubin: 0.4 mg/dL (ref 0.0–1.2)
Total Protein: 5.9 g/dL — ABNORMAL LOW (ref 6.5–8.1)

## 2023-12-11 LAB — MAGNESIUM: Magnesium: 2.1 mg/dL (ref 1.7–2.4)

## 2023-12-11 LAB — PHOSPHORUS: Phosphorus: 3.6 mg/dL (ref 2.5–4.6)

## 2023-12-11 LAB — TRIGLYCERIDES: Triglycerides: 72 mg/dL (ref <150)

## 2023-12-11 MED ORDER — SODIUM CHLORIDE 0.9 % IV BOLUS
500.0000 mL | Freq: Once | INTRAVENOUS | Status: AC
  Administered 2023-12-11: 500 mL via INTRAVENOUS

## 2023-12-11 MED ORDER — TRAVASOL 10 % IV SOLN
INTRAVENOUS | Status: AC
  Filled 2023-12-11: qty 1320

## 2023-12-11 NOTE — Progress Notes (Signed)
 David Mcgrath is a yellow M[place]. VS-T-97.6  Bp-91/45 R-27 P-89 O2 98% Room Air. Dr. Leotis notified and orders were given. [name] receiving 500 Ml. Normal Saline. Family ar bedside

## 2023-12-11 NOTE — Progress Notes (Signed)
 Nutrition Follow-up  DOCUMENTATION CODES:   Severe malnutrition in context of acute illness/injury  INTERVENTION:   -  Continue goal TPN.              - TPN management per pharmacy.   - Daily weights while on TPN.  NUTRITION DIAGNOSIS:   Severe Malnutrition related to acute illness (recurrent small bowel obstruction) as evidenced by severe fat depletion, severe muscle depletion, energy intake < or equal to 50% for > or equal to 5 days, percent weight loss (22% in 1 month).  Ongoing.  GOAL:   Patient will meet greater than or equal to 90% of their needs  TPN  MONITOR:   Diet advancement, Labs, Weight trends  ASSESSMENT:   73 y.o. male with PMH significant for HTN, HLD, chronic anxiety/depression, polyneuropathy, multiple myeloma (diagnosed March 2025). Patient recently admitted both 7/12-7/18 and 7/31-8/3 with intractable nausea vomiting, recurrent small bowel obstruction and was treated conservatively with NG tube decompression and discharged home.  Presented back 8/10 with loose stools and cramping abdominal pain. Admitted for ileus vs partial SBO.  8/10 Admit 8/11 Soft diet 8/13 FLD 8/17 NPO; NGT placed 8/18 TPN initiated 8/28 s/p small bowel enteroscopy; NGT removed 8/29 repeat abdominal xray -> showing partial SBO vs ileus; started on clear liquid diet 8/30 NGT replaced after patient vomited; NPO   Per GI note, pt is not able to have G-J placed d/t anatomy not favorable. Still having significant output from NGT, output: 1200 ml since 1100 yesterday.   TPN continues at goal rate of 100 ml/hr, providing 2438 kcals and 132g protein.  Admission weight: 184 lbs Current weight: 168 lbs  Medications reviewed.  Labs reviewed: TG: 72  Diet Order:   Diet Order     None       EDUCATION NEEDS:   Education needs have been addressed  Skin:  Skin Assessment: Skin Integrity Issues: Skin Integrity Issues:: Stage II Stage II: Sacrum  Last BM:  9/7 -type  7  Height:   Ht Readings from Last 1 Encounters:  11/18/23 6' 4 (1.93 m)    Weight:   Wt Readings from Last 1 Encounters:  12/11/23 76.4 kg    Ideal Body Weight:  91.8 kg  BMI:  Body mass index is 20.5 kg/m.  Estimated Nutritional Needs:   Kcal:  2400-2600 kcals  Protein:  120-135 grams  Fluid:  >/= 2.4L   Morna Lee, MS, RD, LDN Inpatient Clinical Dietitian Contact via Secure chat

## 2023-12-11 NOTE — Plan of Care (Signed)
  Problem: Education: Goal: Knowledge of General Education information will improve Description: Including pain rating scale, medication(s)/side effects and non-pharmacologic comfort measures Outcome: Progressing   Problem: Health Behavior/Discharge Planning: Goal: Ability to manage health-related needs will improve Outcome: Progressing   Problem: Clinical Measurements: Goal: Ability to maintain clinical measurements within normal limits will improve Outcome: Progressing Goal: Will remain free from infection Outcome: Progressing Goal: Diagnostic test results will improve Outcome: Progressing Goal: Respiratory complications will improve Outcome: Progressing Goal: Cardiovascular complication will be avoided Outcome: Progressing   Problem: Activity: Goal: Risk for activity intolerance will decrease Outcome: Progressing   Problem: Nutrition: Goal: Adequate nutrition will be maintained Outcome: Progressing   Problem: Coping: Goal: Level of anxiety will decrease Outcome: Progressing   Problem: Elimination: Goal: Will not experience complications related to bowel motility Outcome: Progressing Goal: Will not experience complications related to urinary retention Outcome: Progressing   Problem: Pain Managment: Goal: General experience of comfort will improve and/or be controlled Outcome: Progressing   Problem: Safety: Goal: Ability to remain free from injury will improve Outcome: Progressing   Problem: Skin Integrity: Goal: Risk for impaired skin integrity will decrease Outcome: Progressing   Problem: Nutrition Goal: Patient maintains adequate hydration Outcome: Progressing Goal: Patient maintains weight Outcome: Progressing   Problem: Education: Goal: Individualized Educational Video(s) Outcome: Progressing   Problem: Coping: Goal: Ability to adjust to condition or change in health will improve Outcome: Progressing   Problem: Fluid Volume: Goal: Ability to  maintain a balanced intake and output will improve Outcome: Progressing   Problem: Health Behavior/Discharge Planning: Goal: Ability to identify and utilize available resources and services will improve Outcome: Progressing Goal: Ability to manage health-related needs will improve Outcome: Progressing   Problem: Nutritional: Goal: Maintenance of adequate nutrition will improve Outcome: Progressing Goal: Progress toward achieving an optimal weight will improve Outcome: Progressing   Problem: Skin Integrity: Goal: Risk for impaired skin integrity will decrease Outcome: Progressing   Problem: Tissue Perfusion: Goal: Adequacy of tissue perfusion will improve Outcome: Progressing

## 2023-12-11 NOTE — Progress Notes (Signed)
 Subjective: Patient has had liquid bowel movements with use of Linzess , but complains of severe pain after taking Linzess .  Objective: Vital signs in last 24 hours: Temp:  [97.7 F (36.5 C)-98.9 F (37.2 C)] 98.6 F (37 C) (09/08 0550) Pulse Rate:  [84-96] 87 (09/08 0550) Resp:  [18-30] 23 (09/08 0600) BP: (98-141)/(53-79) 98/53 (09/08 0550) SpO2:  [97 %-99 %] 97 % (09/08 0550) Weight:  [76.4 kg] 76.4 kg (09/08 0500) Weight change:  Last BM Date : 12/10/23  PE: Ill-appearing GENERAL: NG tube placed to intermittent wall suction draining bilious fluid  ABDOMEN: Nondistended, sluggish bowel sounds, nontender EXTREMITIES: No deformity  Lab Results: Results for orders placed or performed during the hospital encounter of 11/12/23 (from the past 48 hours)  CBC     Status: Abnormal   Collection Time: 12/10/23  6:28 AM  Result Value Ref Range   WBC 5.1 4.0 - 10.5 K/uL   RBC 2.98 (L) 4.22 - 5.81 MIL/uL   Hemoglobin 9.5 (L) 13.0 - 17.0 g/dL   HCT 70.5 (L) 60.9 - 47.9 %   MCV 98.7 80.0 - 100.0 fL   MCH 31.9 26.0 - 34.0 pg   MCHC 32.3 30.0 - 36.0 g/dL   RDW 82.6 (H) 88.4 - 84.4 %   Platelets 321 150 - 400 K/uL   nRBC 0.0 0.0 - 0.2 %    Comment: Performed at [facility], [address] [place], [place] [address]  Basic metabolic panel with GFR     Status: Abnormal   Collection Time: 12/10/23  6:28 AM  Result Value Ref Range   Sodium 140 135 - 145 mmol/L   Potassium 4.4 3.5 - 5.1 mmol/L   Chloride 105 98 - 111 mmol/L   CO2 25 22 - 32 mmol/L   Glucose, Bld 110 (H) 70 - 99 mg/dL    Comment: Glucose reference range applies only to samples taken after fasting for at least 8 hours.   BUN 32 (H) 8 - 23 mg/dL   Creatinine, Ser 9.40 (L) 0.61 - 1.24 mg/dL   Calcium  7.6 (L) 8.9 - 10.3 mg/dL   GFR, Estimated >39 >39 mL/min    Comment: (NOTE) Calculated using the CKD-EPI Creatinine Equation (2021)    Anion gap 9 5 - 15    Comment: Performed at [facility] [address], [place], [place] [address]  Magnesium      Status: None   Collection Time: 12/10/23  6:28 AM  Result Value Ref Range   Magnesium  2.3 1.7 - 2.4 mg/dL    Comment: Performed at [facility], [address], [place], [place] [address]  Phosphorus     Status: None   Collection Time: 12/10/23  6:28 AM  Result Value Ref Range   Phosphorus 4.3 2.5 - 4.6 mg/dL    Comment: Performed at [facility], [address] [place], [place] [address]  Comprehensive metabolic panel     Status: Abnormal   Collection Time: 12/11/23  5:52 AM  Result Value Ref Range   Sodium 139 135 - 145 mmol/L   Potassium 4.4 3.5 - 5.1 mmol/L   Chloride 104 98 - 111 mmol/L   CO2 26 22 - 32 mmol/L   Glucose, Bld 103 (H) 70 - 99 mg/dL    Comment: Glucose reference range applies only to samples taken after fasting for at least 8 hours.   BUN 30 (H) 8 - 23 mg/dL   Creatinine, Ser  0.58 (L) 0.61 - 1.24 mg/dL   Calcium  8.7 (L) 8.9 - 10.3 mg/dL   Total Protein 5.9 (L) 6.5 - 8.1 g/dL   Albumin  2.6 (L) 3.5 - 5.0 g/dL   AST 25 15 - 41 U/L   ALT 24 0 - 44 U/L   Alkaline Phosphatase 135 (H) 38 - 126 U/L   Total Bilirubin 0.4 0.0 - 1.2 mg/dL   GFR, Estimated >39 >39 mL/min    Comment: (NOTE) Calculated using the CKD-EPI Creatinine Equation (2021)    Anion gap 9 5 - 15    Comment: Performed at [facility], [address], [place], [place] [address]  Magnesium      Status: None   Collection Time: 12/11/23  5:52 AM  Result Value Ref Range   Magnesium  2.1 1.7 - 2.4 mg/dL    Comment: Performed at [facility], [address], [place], [place] [address]  Phosphorus     Status: None   Collection Time: 12/11/23  5:52 AM  Result Value Ref Range   Phosphorus 3.6 2.5 - 4.6 mg/dL    Comment: Performed at [facility], [address] [place], [place] [address]  Triglycerides     Status: None   Collection Time:  12/11/23  5:53 AM  Result Value Ref Range   Triglycerides 72 <150 mg/dL    Comment: Performed at [facility], [address] [place], [place] [address]  CBC     Status: Abnormal   Collection Time: 12/11/23  5:53 AM  Result Value Ref Range   WBC 5.3 4.0 - 10.5 K/uL   RBC 3.07 (L) 4.22 - 5.81 MIL/uL   Hemoglobin 9.4 (L) 13.0 - 17.0 g/dL   HCT 69.6 (L) 60.9 - 47.9 %   MCV 98.7 80.0 - 100.0 fL   MCH 30.6 26.0 - 34.0 pg   MCHC 31.0 30.0 - 36.0 g/dL   RDW 82.7 (H) 88.4 - 84.4 %   Platelets 345 150 - 400 K/uL   nRBC 0.0 0.0 - 0.2 %    Comment: Performed at [facility], [address], [place], [place] [address]    Studies/Results: DG Abd 1 View Result Date: 12/09/2023 CLINICAL DATA:  Abdominal distension, ileus EXAM: ABDOMEN - 1 VIEW COMPARISON:  12/09/2023 at 8:01 a.m. FINDINGS: Two supine frontal views of the abdomen and pelvis are obtained. There is nonspecific gaseous distension of the large and small bowel, with decreased caliber of the small bowel dilatation seen previously. Findings may reflect resolving obstruction or ileus. No masses or abnormal calcifications. Enteric catheter tip projects over the gastric fundus. IMPRESSION: 1. Nonspecific gaseous distension of the large and small bowel, with decreased caliber since prior study, which may reflect resolving obstruction or ileus. Electronically Signed   By: Ozell Daring M.D.   On: 12/09/2023 18:21    Medications: I have reviewed the patient's current medications.  Assessment: Prolonged ileus, may be related to dysmotility On NG tube to intermittent wall suction, 1500 mL of bilious output in the last 12 hours As per IR, patient's anatomy not favorable for percutaneous G-tube placement. Status post exploratory laparotomy, EGD/enteroscopy and biopsies, trial of erythromycin .  Potassium 4.4, magnesium  2.1 Mild malnutrition, albumin  2.6, total protein 5.9, on IV TPN Normocytic anemia,  hemoglobin 9.4, stable  Plan: Because NG output is still significant, he is not ready for NG tube to be clamped as yet. Will get follow-up abdominal x-ray today. If there is  improvement in bowel gas pattern, recommend giving simethicone  1 dose followed by Linzess  to 90 mcg x 1 dose. Continue to keep potassium more than 4 and magnesium  more than 2.   Estelita Manas, MD 12/11/2023, 9:46 AM

## 2023-12-11 NOTE — Progress Notes (Signed)
 PHARMACY - TOTAL PARENTERAL NUTRITION CONSULT NOTE   Indication: Prolonged ileus  Patient Measurements: Height: 6' 4 (193 cm) Weight: 76.4 kg (168 lb 6.9 oz) IBW/kg (Calculated) : 86.8 TPN AdjBW (KG): 94.6 Body mass index is 20.5 kg/m.  Assessment:  Pharmacy is consulted to start TPN on 73 yo male diagnosed with bowel obstruction. This admission CT abdomen shows increasing small bowel dilatation and fecalization of bowel contents involving the jejunum. No definitive transition point is identified at this time but caliber change is noted in the right mid abdomen at the junction of the jejunum and ileum. The more distal ileum is unremarkable. Some suspicion that bowel obstruction may be related to bortezomib  treatment pt receiving for multiple myeloma.   Glucose / Insulin : no hx of DM. Goal BG 140-180.  CBGs and SSI d/c'd on 9/3. Electrolytes: WNL including CorrCa 9.8 Renal: SCr < 1, BUN 32 Hepatic: LFTs WNL, alb 2.6; TG stable Intake / Output; MIVF:  -UOP 900 ml recorded this am -NG output 900 ml recorded this am, continues to have > 1 L out in 24 hrs GI Imaging: -Admitted from 10/14/23-10/20/23 for symptoms related to SBO.  CT: Transition point suspected in the right hemi-abdomen. Received non-operative management.  -Admitted 11/02/23 - 11/05/2023 for small bowel dilatation/concern for small bowel obstruction. CT imaging showed persistent small bowel dilatation with mild transition point noted deep in pelvis. He went to OR 11/03/23, findings were suspicious for intermittent volvulus of small bowel to explain his symptoms and CT imaging.  -8/10  CT abdomen shows increasing small bowel dilatation and fecalization of bowel contents involving the jejunum. No definitive transition point -9/4 Abd XR: likely improving ileus -9/6 Abd XR: concerning for ileus or possibly obstruction GI Surgeries / Procedures:  -8/1 diagnostic laparoscopy -8/28: EGD, enteroscopy - small hiatal hernia, gastritis,  duodenum/jejunum appeared normal; several biopsies taken  Central access:  PICC 8/18   Nutritional Goals: Goal TPN rate is 100 mL/hr (provides 132 g of protein and 2438 kcals per day)  RD Assessment:  Estimated Needs Total Energy Estimated Needs: 2400-2600 kcals Total Protein Estimated Needs: 120-135 grams Total Fluid Estimated Needs: >/= 2.4L  Current Nutrition:  NPO and TPN  Plan:  Continue TPN at 100 mL/hr Electrolytes in TPN:  Na 50 mEq/L  K 40 mEq/L Ca 2.5 mEq/L Mg 2.5 mEq/L Phos 5 mmol/L  Cl:Ac 1:1 Add standard MVI and trace elements to TPN Monitor TPN labs on Mon/Thurs   Thank you for allowing pharmacy to be a part of this patient's care.  Stefano MARLA Bologna, PharmD, BCPS Clinical Pharmacist 12/11/2023 12:12 PM

## 2023-12-11 NOTE — Progress Notes (Signed)
 [place] at 11:50, Vital Signs Bp-97.6, P-89 R-27 Bp-91/45 O2-98% RA.Dr. Leotis notified and and ordered 500 ML Bolus. After Bolus Vital Signs Bp-103/55 P-87 R-20 O2 99% RA. Dr. Leotis ordered to hold his Lopressor  5 mg @ 14:00. Wife at bedside.

## 2023-12-11 NOTE — Progress Notes (Signed)
 PT Cancellation Note  Patient Details Name: David Mcgrath MRN: [id number] DOB: 29-Nov-1950   Cancelled Treatment:    Reason Eval/Treat Not Completed: Medical issues which prohibited therapy Darice Potters PT Acute Rehabilitation Services Office [phone]   Potters Darice Norris 12/11/2023, 1:59 PM

## 2023-12-11 NOTE — Progress Notes (Signed)
 PROGRESS NOTE    Archie Shea III  FMW:[id number] DOB: July 02, 1950 DOA: 11/12/2023 PCP: [name], DO    Brief Narrative:  This 73 yrs old male with medical history significant for hypertension, hyperlipidemia, paroxysmal A-fib on Eliquis , chronic anxiety/depression, polyneuropathy, multiple myeloma (diagnosed March 2025) received Bortezomib  infusion 11/09/2023.  Patient presented with worsening abdominal pain and distention noted to have persistent ileus on exam and imaging with continued gastric distention.  General surgery and GI consulted at intake, upper GI series and small bowel follow-through confirmed delayed gastric emptying -notable contrast in the rectum confirms no complete obstruction.  Initially evaluated with endoscopy and exploratory laparoscopy with no clear findings or etiology for patient's symptoms.   At this time patient continues with low intermittent suction via NG tube with TPN for nutrition.  Initial plan for possible IR placement of GJ tube,  unable to be completed due to poor window.  Will continue supportive care as below in the interim/rule out stool impaction/obstruction. Patient remains high risk for decompensation given poor p.o. intake now for prolonged period time while on TPN - PT/OT to follow.   Assessment & Plan:   Principal Problem:   Ileus (HCC) Active Problems:   Hypokalemia   Paroxysmal atrial fibrillation (HCC)   Multiple myeloma (HCC)   Essential hypertension   Depression   Hyperlipidemia   Pressure injury of skin   Protein-calorie malnutrition, severe   Hypernatremia   Swelling of lower extremity   Transaminitis   Acute urinary retention   NSVT (nonsustained ventricular tachycardia) (HCC)   Severe protein calorie malnutrition: - Patient remains NPO. with NG tube to low intermittent suction, unable to tolerate even clears earlier this week  - Continues on TPN .   Ileus POA, ongoing : Partial small bowel obstruction: - Imaging  continues to confirm ileus, small bowel obstruction unlikely given contrast continues to present to the colon. - Large stool burden noted on imaging, continue enema PRN - plain films confirm ongoing large stool burden - continue suppository/enema. - Status post EGD and exploratory laparoscopy without any clear findings to explain imaging or symptoms - Appreciate GI and general surgery insight and recommendations -no further indication for repeat procedure at this time - Continue supportive care as tolerated, on TPN and NG to low intermittent suction as above. - IR unable to place GJ tube due to poor windows. - Patient did not tolerate erythromycin  trial. - Wean narcotics to avoid ongoing/worsening gastroparesis and constipation- morphine  discontinued 12/07/23 - Avoid neostigmine per recommendations from GI; did not tolerate reglan  due to side effects(tardive dyskenesia) - Noted acute onset abdominal pain 9/6 with large bowel movement after Linzess  administration.   -Current plan discussed with GI: clamping NG trial tomorrow as well as increased p.o. motility agents including Linzess , MiraLAX , Senokot and continue rectal suppositories and enema as needed to maintain soft to loose bowel movements.   Leukocytosis, reactive, resolved. -Likely reactive in the setting of profound hypovolemia / hypotension s/p large GI losses on 8/16 and subsequently became hypotensive with elevated lactic acid levels. - Continue to hold antibiotics at this time as no further leukocytosis, patient afebrile - Patient noted to have some orthostatic hypotension on 11/28/2023 while working with PT/OT and advised Ace wrap/TED hose while working with PT.   Constipation ongoing but improving Diarrhea, resolved: - Patient noted to have reported semiformed stools. - GI pathogen panel negative. - Patient likely colonized with C. difficile as testing was negative for any active infection (antigen positive, toxin  negative, PCR  negative). - Patient currently with an ileus/constipation as above - continue treatment as above.   Hypotension - Resolved with hydration early on in the hospitalization.  Acute urinary retention, resolved -Foley catheter placed 11/13/2023 -now removed with appropriate urine output.   History of multiple myeloma - He was found somewhat patient symptoms thought to be due to chemotherapeutic medications and oncology reengaged to see if they had any further recommendations.   -Outpatient follow-up with oncology recommended.   Ambulatory dysfunction, improving : - Continue PT OT, may benefit from ongoing therapy after discharge,  pending clinical course   Paroxysmal A-fib/NSVT: -Prior episodes of nonsustained V. tach and paroxysmal A-fib on telemetry, asymptomatic - Echo 4/24 EF 60 to 65% -no indication to repeat at this time - Continue scheduled metoprolol  IV - as needed required - Previously on Eliquis  -on hold given recent EGD with biopsy and NPO. status - on heparin  drip.   Left finger discoloration, transient, resolved - Unspecified etiology - transient episode of discoloration of left fingers on 8/17-18 that resolved over 48 hours . - Questionable Raynaud's phenomenon. - Left upper extremity arterial Dopplers were done with no obstruction of the left upper extremity with triphasic waves. - Patient seen in consultation by vascular surgery who felt no surgical intervention needed at this time.   Normocytic anemia -Patient with no significant overt bleeding. -Status post 2u PRBC (12/05/23)    Hypovolemic hypernatremia Hypokalemia/hypomagnesemia - Na elevated in the setting of dehydration - continue fluid supplementation, resolved - Patient currently on TPN. - Labs currently improved   Abnormal LFTs - Acute hepatitis panel negative - Complicated by above, improving   Bilateral lower extremity swelling, resolved. - Negative lower extremity DVT study - Superficial thrombus left small  saphenous vein noted   Lactic acidosis - Resolved.   DVT prophylaxis: SCDs Code Status: Full code Family Communication: Wife at bed side Disposition Plan:    Status is: Inpatient Remains inpatient appropriate because: Severity of illness.    Consultants:  Gastroenterology  Procedures: None  Antimicrobials:  Anti-infectives (From admission, onward)    Start     Dose/Rate Route Frequency Ordered Stop   11/27/23 1200  erythromycin  500 mg in sodium chloride  0.9 % 100 mL IVPB        500 mg 100 mL/hr over 60 Minutes Intravenous Every 6 hours 11/27/23 0933 11/29/23 0723   11/24/23 1400  erythromycin  500 mg in sodium chloride  0.9 % 100 mL IVPB  Status:  Discontinued        500 mg 100 mL/hr over 60 Minutes Intravenous Every 8 hours 11/24/23 1027 11/27/23 0933   11/23/23 2000  erythromycin  250 mg in sodium chloride  0.9 % 100 mL IVPB  Status:  Discontinued        250 mg 100 mL/hr over 60 Minutes Intravenous Every 8 hours 11/23/23 1744 11/24/23 1027   11/23/23 1200  erythromycin  (E-MYCIN ) tablet 250 mg  Status:  Discontinued        250 mg Oral 3 times daily before meals 11/23/23 1004 11/23/23 1744   11/18/23 1200  erythromycin  (E-MYCIN ) tablet 250 mg  Status:  Discontinued        250 mg Oral 2 times daily with meals 11/18/23 0720 11/19/23 0734   11/13/23 1000  acyclovir  (ZOVIRAX ) tablet 400 mg  Status:  Discontinued        400 mg Oral 2 times daily 11/13/23 0503 11/19/23 0739       Subjective: Patient was seen and  examined at bedside.  Overnight events noted. Patient continued to have bilious drainage in the NG tube.  NG tube is not ready to be clamped today.  Objective: Vitals:   12/11/23 0500 12/11/23 0550 12/11/23 0600 12/11/23 1150  BP:  (!) 98/53  (!) 91/45  Pulse:  87  89  Resp:  18 (!) 23   Temp:  98.6 F (37 C)  97.6 F (36.4 C)  TempSrc:  Oral  Oral  SpO2:  97%  98%  Weight: 76.4 kg     Height:        Intake/Output Summary (Last 24 hours) at 12/11/2023  1410 Last data filed at 12/11/2023 1004 Gross per 24 hour  Intake 1235.13 ml  Output 2550 ml  Net -1314.87 ml   Filed Weights   12/07/23 0500 12/08/23 0426 12/11/23 0500  Weight: 80.2 kg 79.8 kg 76.4 kg    Examination:  General exam: Appears calm and comfortable , deconditioned, not in any acute distress. Respiratory system: Clear to auscultation. Respiratory effort normal.  RR 15 Cardiovascular system: S1 & S2 heard, RRR. No JVD, murmurs, rubs, gallops or clicks.  Gastrointestinal system: Abdomen is non distended, soft and non tender.  Normal bowel sounds heard. Central nervous system: Alert and oriented x 3. No focal neurological deficits. Extremities: No edema, no cyanosis, no clubbing. Skin: No rashes, lesions or ulcers Psychiatry: Judgement and insight appear normal. Mood & affect appropriate.     Data Reviewed: I have personally reviewed following labs and imaging studies  CBC: Recent Labs  Lab 12/07/23 0527 12/08/23 0423 12/09/23 0617 12/10/23 0628 12/11/23 0553  WBC 6.9 6.0 5.8 5.1 5.3  HGB 9.8* 9.8* 9.7* 9.5* 9.4*  HCT 31.0* 30.6* 31.3* 29.4* 30.3*  MCV 97.8 97.8 98.4 98.7 98.7  PLT 307 314 334 321 345   Basic Metabolic Panel: Recent Labs  Lab 12/07/23 0527 12/08/23 0423 12/09/23 0617 12/10/23 0628 12/11/23 0552  NA 136 135 138 140 139  K 4.1 4.1 4.3 4.4 4.4  CL 103 102 103 105 104  CO2 24 24 24 25 26   GLUCOSE 111* 94 98 110* 103*  BUN 24* 24* 25* 32* 30*  CREATININE 0.44* 0.47* 0.54* 0.59* 0.58*  CALCIUM  8.5* 8.5* 8.7* 7.6* 8.7*  MG 2.3 2.1 2.2 2.3 2.1  PHOS 3.6 3.7 4.2 4.3 3.6   GFR: Estimated Creatinine Clearance: 90.2 mL/min (A) (by C-G formula based on SCr of 0.58 mg/dL (L)). Liver Function Tests: Recent Labs  Lab 12/07/23 0527 12/11/23 0552  AST 22 25  ALT 33 24  ALKPHOS 117 135*  BILITOT 0.7 0.4  PROT 5.6* 5.9*  ALBUMIN  2.5* 2.6*   No results for input(s): LIPASE, AMYLASE in the last 168 hours. No results for input(s):  AMMONIA in the last 168 hours. Coagulation Profile: No results for input(s): INR, PROTIME in the last 168 hours. Cardiac Enzymes: No results for input(s): CKTOTAL, CKMB, CKMBINDEX, TROPONINI in the last 168 hours. BNP (last 3 results) No results for input(s): PROBNP in the last 8760 hours. HbA1C: No results for input(s): HGBA1C in the last 72 hours. CBG: Recent Labs  Lab 12/07/23 0756 12/07/23 1539 12/07/23 2334 12/08/23 0756 12/08/23 1544  GLUCAP 105* 93 92 103* 87   Lipid Profile: Recent Labs    12/11/23 0553  TRIG 72   Thyroid  Function Tests: No results for input(s): TSH, T4TOTAL, FREET4, T3FREE, THYROIDAB in the last 72 hours. Anemia Panel: No results for input(s): VITAMINB12, FOLATE, FERRITIN, TIBC, IRON, RETICCTPCT in  the last 72 hours. Sepsis Labs: Recent Labs  Lab 12/05/23 0524  LATICACIDVEN 0.7    Recent Results (from the past 240 hours)  Culture, blood (Routine X 2) w Reflex to ID Panel     Status: None   Collection Time: 12/05/23  5:24 AM   Specimen: BLOOD RIGHT HAND  Result Value Ref Range Status   Specimen Description   Final    BLOOD RIGHT HAND Performed at [facility], [address], [place], [place] [address]    Special Requests   Final    BOTTLES DRAWN AEROBIC AND ANAEROBIC Blood Culture results may not be optimal due to an inadequate volume of blood received in culture bottles Performed at [facility], [address], [place], [place] [address]    Culture   Final    NO GROWTH 5 DAYS Performed at [facility], [address] [place], [place] [address]    Report Status 12/10/2023 FINAL  Final  Culture, blood (Routine X 2) w Reflex to ID Panel     Status: None   Collection Time: 12/05/23  5:33 AM   Specimen: BLOOD  Result Value Ref Range Status   Specimen Description   Final    BLOOD SITE NOT SPECIFIED Performed at [facility], [address],  [place], [place] [address]    Special Requests   Final    BOTTLES DRAWN AEROBIC ONLY Blood Culture results may not be optimal due to an inadequate volume of blood received in culture bottles Performed at [facility], [address], [place], [place] [address]    Culture   Final    NO GROWTH 5 DAYS Performed at [facility], [address] [place], [place] [address]    Report Status 12/10/2023 FINAL  Final         Radiology Studies: DG Abd 1 View Result Date: 12/11/2023 CLINICAL DATA:  Ileus. EXAM: ABDOMEN - 1 VIEW COMPARISON:  Radiographs 12/09/2023 and 10/15/2023.  CT 12/05/2023. FINDINGS: 1008 hours. Two supine views the abdomen and pelvis are submitted. Persistent central small bowel distension, slightly increased compared with the most recent prior study from 2 days ago. Some gas remains within the colon. No supine evidence of pneumoperitoneum. Enteric tube is unchanged, projecting over the proximal stomach. No suspicious abdominal calcifications or acute osseous findings. There are stable degenerative changes in the spine. IMPRESSION: Slightly increased small bowel distension compared with the most recent prior study from 2 days ago. Findings remain most consistent with an ileus. Low-grade partial small bowel obstruction considered less likely. Electronically Signed   By: Elsie Perone M.D.   On: 12/11/2023 14:01   DG Abd 1 View Result Date: 12/09/2023 CLINICAL DATA:  Abdominal distension, ileus EXAM: ABDOMEN - 1 VIEW COMPARISON:  12/09/2023 at 8:01 a.m. FINDINGS: Two supine frontal views of the abdomen and pelvis are obtained. There is nonspecific gaseous distension of the large and small bowel, with decreased caliber of the small bowel dilatation seen previously. Findings may reflect resolving obstruction or ileus. No masses or abnormal calcifications. Enteric catheter tip projects over the gastric fundus. IMPRESSION: 1. Nonspecific gaseous distension of the large  and small bowel, with decreased caliber since prior study, which may reflect resolving obstruction or ileus. Electronically Signed   By: Ozell Daring M.D.   On: 12/09/2023 18:21   Scheduled Meds:  Chlorhexidine  Gluconate Cloth  6 each Topical Q2200   enoxaparin  (LOVENOX ) injection  1 mg/kg Subcutaneous Q12H   fluticasone   1 spray Each Nare Daily   metoprolol  tartrate  5 mg Intravenous Q8H   pantoprazole  (PROTONIX ) IV  40 mg Intravenous Q12H   sodium chloride  flush  10-40 mL Intracatheter Q12H   sodium chloride  flush  10-40 mL Intracatheter Q12H   Continuous Infusions:  TPN ADULT (ION) 100 mL/hr at 12/10/23 1821   TPN ADULT (ION)       LOS: 28 days    Time spent: 50 mins    Darcel Dawley, MD [facility]   If 7PM-7AM, please contact night-coverage

## 2023-12-11 NOTE — Progress Notes (Signed)
 Inpatient Rehab Admissions Coordinator:   We continue to follow for progress with NG/TPN d/c in order to request prior auth from insurance.   Reche Lowers, PT, DPT Admissions Coordinator [phone] 12/11/23  10:12 AM

## 2023-12-11 NOTE — Plan of Care (Signed)
  Problem: Education: Goal: Knowledge of General Education information will improve Description: Including pain rating scale, medication(s)/side effects and non-pharmacologic comfort measures Outcome: Progressing   Problem: Clinical Measurements: Goal: Ability to maintain clinical measurements within normal limits will improve Outcome: Progressing Goal: Will remain free from infection Outcome: Progressing Goal: Diagnostic test results will improve Outcome: Progressing Goal: Cardiovascular complication will be avoided Outcome: Progressing   Problem: Activity: Goal: Risk for activity intolerance will decrease Outcome: Progressing   Problem: Nutrition: Goal: Adequate nutrition will be maintained Outcome: Progressing   Problem: Coping: Goal: Level of anxiety will decrease Outcome: Progressing   Problem: Elimination: Goal: Will not experience complications related to bowel motility Outcome: Progressing Goal: Will not experience complications related to urinary retention Outcome: Progressing   Problem: Pain Managment: Goal: General experience of comfort will improve and/or be controlled Outcome: Progressing   Problem: Safety: Goal: Ability to remain free from injury will improve Outcome: Progressing   Problem: Skin Integrity: Goal: Risk for impaired skin integrity will decrease Outcome: Progressing   Problem: Nutrition Goal: Patient maintains adequate hydration Outcome: Progressing Goal: Patient maintains weight Outcome: Progressing   Problem: Education: Goal: Individualized Educational Video(s) Outcome: Progressing   Problem: Coping: Goal: Ability to adjust to condition or change in health will improve Outcome: Progressing   Problem: Fluid Volume: Goal: Ability to maintain a balanced intake and output will improve Outcome: Progressing   Problem: Health Behavior/Discharge Planning: Goal: Ability to manage health-related needs will improve Outcome:  Progressing   Problem: Nutritional: Goal: Maintenance of adequate nutrition will improve Outcome: Progressing   Problem: Skin Integrity: Goal: Risk for impaired skin integrity will decrease Outcome: Progressing   Problem: Tissue Perfusion: Goal: Adequacy of tissue perfusion will improve Outcome: Progressing

## 2023-12-12 DIAGNOSIS — K567 Ileus, unspecified: Secondary | ICD-10-CM | POA: Diagnosis not present

## 2023-12-12 MED ORDER — POLYETHYLENE GLYCOL 3350 17 G PO PACK
17.0000 g | PACK | Freq: Two times a day (BID) | ORAL | Status: AC
Start: 2023-12-12 — End: 2023-12-12
  Administered 2023-12-12 (×2): 17 g via ORAL
  Filled 2023-12-12 (×2): qty 1

## 2023-12-12 MED ORDER — TRAVASOL 10 % IV SOLN
INTRAVENOUS | Status: AC
  Filled 2023-12-12: qty 1320

## 2023-12-12 NOTE — Plan of Care (Signed)
  Problem: Education: Goal: Knowledge of General Education information will improve Description: Including pain rating scale, medication(s)/side effects and non-pharmacologic comfort measures Outcome: Progressing   Problem: Clinical Measurements: Goal: Ability to maintain clinical measurements within normal limits will improve Outcome: Progressing Goal: Will remain free from infection Outcome: Progressing Goal: Diagnostic test results will improve Outcome: Progressing Goal: Respiratory complications will improve Outcome: Progressing Goal: Cardiovascular complication will be avoided Outcome: Progressing   Problem: Activity: Goal: Risk for activity intolerance will decrease Outcome: Progressing   Problem: Pain Managment: Goal: General experience of comfort will improve and/or be controlled Outcome: Progressing   Problem: Safety: Goal: Ability to remain free from injury will improve Outcome: Progressing   Problem: Skin Integrity: Goal: Risk for impaired skin integrity will decrease Outcome: Progressing

## 2023-12-12 NOTE — Progress Notes (Signed)
 PROGRESS NOTE    David Mcgrath  FMW:[id number] DOB: Oct 31, 1950 DOA: 11/12/2023 PCP: [name], DO    Brief Narrative:  This 73 yrs old male with medical history significant for hypertension, hyperlipidemia, paroxysmal A-fib on Eliquis , chronic anxiety/depression, polyneuropathy, multiple myeloma (diagnosed March 2025) received Bortezomib  infusion 11/09/2023.  Patient presented with worsening abdominal pain and distention noted to have persistent ileus on exam and imaging with continued gastric distention.  General surgery and GI consulted at intake, upper GI series and small bowel follow-through confirmed delayed gastric emptying -notable contrast in the rectum confirms no complete obstruction.  Initially evaluated with endoscopy and exploratory laparoscopy with no clear findings or etiology for patient's symptoms.   At this time patient continues with low intermittent suction via NG tube with TPN for nutrition.  Initial plan for possible IR placement of GJ tube,  unable to be completed due to poor window.  Will continue supportive care as below in the interim/rule out stool impaction/obstruction. Patient remains high risk for decompensation given poor p.o. intake now for prolonged period time while on TPN - PT/OT to follow.   Assessment & Plan:   Principal Problem:   Ileus (HCC) Active Problems:   Hypokalemia   Paroxysmal atrial fibrillation (HCC)   Multiple myeloma (HCC)   Essential hypertension   Depression   Hyperlipidemia   Pressure injury of skin   Protein-calorie malnutrition, severe   Hypernatremia   Swelling of lower extremity   Transaminitis   Acute urinary retention   NSVT (nonsustained ventricular tachycardia) (HCC)   Severe protein calorie malnutrition: - Patient remains NPO. with NG tube to low intermittent suction, unable to tolerate even clears earlier this week  - Continues on TPN .   Ileus POA, ongoing : Partial small bowel obstruction: - Imaging  continues to confirm ileus, small bowel obstruction unlikely given contrast continues to present to the colon. - Large stool burden noted on imaging, continue enema PRN - plain films confirm ongoing large stool burden - continue suppository/enema. - Status post EGD and exploratory laparoscopy without any clear findings to explain imaging or symptoms - Appreciate GI and general surgery insight and recommendations -no further indication for repeat procedure at this time - Continue supportive care as tolerated, on TPN and NG to low intermittent suction as above. - IR unable to place GJ tube due to poor windows. - Patient did not tolerate erythromycin  trial. - Wean narcotics to avoid ongoing/worsening gastroparesis and constipation- morphine  discontinued 12/07/23 - Avoid neostigmine per recommendations from GI; did not tolerate reglan  due to side effects(tardive dyskenesia) - Noted acute onset abdominal pain 9/6 with large bowel movement after Linzess  administration.   - Clamp NG tube after 30 minutes after MiraLAX  and therafter place NG tube back to low intermittent suction. -Will get small bowel series tomorrow.   Leukocytosis, reactive, resolved. -Likely reactive in the setting of profound hypovolemia / hypotension , s/p large GI losses on 8/16 and subsequently became hypotensive with elevated lactic acid levels. - Continue to hold antibiotics at this time as no further leukocytosis, patient afebrile - Patient noted to have some orthostatic hypotension on 11/28/2023 while working with PT/OT and advised Ace wrap/TED hose while working with PT.   Constipation ongoing but improving Diarrhea, resolved: - Patient noted to have reported semiformed stools. - GI pathogen panel negative. - Patient likely colonized with C. difficile as testing was negative for any active infection (antigen positive, toxin negative, PCR negative). - Patient currently with an ileus/constipation  as above - continue treatment  as above.   Hypotension - Resolved with hydration early on in the hospitalization.  Acute urinary retention, resolved -Foley catheter placed 11/13/2023 -now removed with appropriate urine output.   History of multiple myeloma - He was found to have symptoms thought to be due to chemotherapeutic medications and oncology reengaged to see if they had any further recommendations.   -Outpatient follow-up with oncology recommended.   Ambulatory dysfunction, improving : - Continue PT OT, may benefit from ongoing therapy after discharge,  pending clinical course.   Paroxysmal A-fib/NSVT: - Prior episodes of nonsustained V. tach and paroxysmal A-fib on telemetry, asymptomatic - Echo 4/24 EF 60 to 65% -no indication to repeat at this time. - Continue scheduled metoprolol  IV - as needed required, - Previously on Eliquis  -on hold given recent EGD with biopsy and NPO. status - on heparin  drip.   Left finger discoloration, transient, resolved. - Unspecified etiology - transient episode of discoloration of left fingers on 8/17-18 that resolved over 48 hours . - Questionable Raynaud's phenomenon. - Left upper extremity arterial Dopplers were done with no obstruction of the left upper extremity with triphasic waves. - Patient seen in consultation by vascular surgery who felt no surgical intervention needed at this time.   Normocytic anemia -Patient with no significant overt bleeding. -Status post 2u PRBC (12/05/23) -H&H remains stable.    Hypovolemic hypernatremia Hypokalemia/hypomagnesemia - Na elevated in the setting of dehydration - continue fluid supplementation, resolved - Patient currently on TPN. - Labs currently improved   Abnormal LFTs - Acute hepatitis panel negative - Complicated by above, improving   Bilateral lower extremity swelling, resolved. - Negative lower extremity DVT study - Superficial thrombus left small saphenous vein noted   Lactic acidosis - Resolved.   DVT  prophylaxis: SCDs Code Status: Full code Family Communication: Wife at bed side Disposition Plan:    Status is: Inpatient Remains inpatient appropriate because: Severity of illness.    Consultants:  Gastroenterology  Procedures: None  Antimicrobials:  Anti-infectives (From admission, onward)    Start     Dose/Rate Route Frequency Ordered Stop   11/27/23 1200  erythromycin  500 mg in sodium chloride  0.9 % 100 mL IVPB        500 mg 100 mL/hr over 60 Minutes Intravenous Every 6 hours 11/27/23 0933 11/29/23 0723   11/24/23 1400  erythromycin  500 mg in sodium chloride  0.9 % 100 mL IVPB  Status:  Discontinued        500 mg 100 mL/hr over 60 Minutes Intravenous Every 8 hours 11/24/23 1027 11/27/23 0933   11/23/23 2000  erythromycin  250 mg in sodium chloride  0.9 % 100 mL IVPB  Status:  Discontinued        250 mg 100 mL/hr over 60 Minutes Intravenous Every 8 hours 11/23/23 1744 11/24/23 1027   11/23/23 1200  erythromycin  (E-MYCIN ) tablet 250 mg  Status:  Discontinued        250 mg Oral 3 times daily before meals 11/23/23 1004 11/23/23 1744   11/18/23 1200  erythromycin  (E-MYCIN ) tablet 250 mg  Status:  Discontinued        250 mg Oral 2 times daily with meals 11/18/23 0720 11/19/23 0734   11/13/23 1000  acyclovir  (ZOVIRAX ) tablet 400 mg  Status:  Discontinued        400 mg Oral 2 times daily 11/13/23 0503 11/19/23 0739       Subjective: Patient was seen and examined at bedside.  Overnight  events noted. Patient continued to have bilious drainage in the NG tube.  Wife is concerned about patient having very poor appetite.  Objective: Vitals:   12/11/23 2125 12/12/23 0443 12/12/23 0500 12/12/23 0846  BP: (!) 107/57 115/62  111/63  Pulse: 89 92  87  Resp: 20 18  (!) 22  Temp: 98.2 F (36.8 C) 98.9 F (37.2 C)  98.9 F (37.2 C)  TempSrc: Oral Oral  Oral  SpO2: 98% 98%  100%  Weight:   73.3 kg   Height:        Intake/Output Summary (Last 24 hours) at 12/12/2023 1223 Last data  filed at 12/12/2023 0600 Gross per 24 hour  Intake 1140 ml  Output 2700 ml  Net -1560 ml   Filed Weights   12/08/23 0426 12/11/23 0500 12/12/23 0500  Weight: 79.8 kg 76.4 kg 73.3 kg    Examination:  General exam: Appears calm and comfortable , deconditioned, not in any acute distress. Respiratory system: CTA Bilaterally . Respiratory effort normal.  RR 14 Cardiovascular system: S1 & S2 heard, RRR. No JVD, murmurs, rubs, gallops or clicks.  Gastrointestinal system: Abdomen is non distended, soft and non tender.  Normal bowel sounds heard. Central nervous system: Alert and oriented x 3. No focal neurological deficits. Extremities: No edema, no cyanosis, no clubbing. Skin: No rashes, lesions or ulcers Psychiatry: Judgement and insight appear normal. Mood & affect appropriate.     Data Reviewed: I have personally reviewed following labs and imaging studies  CBC: Recent Labs  Lab 12/07/23 0527 12/08/23 0423 12/09/23 0617 12/10/23 0628 12/11/23 0553  WBC 6.9 6.0 5.8 5.1 5.3  HGB 9.8* 9.8* 9.7* 9.5* 9.4*  HCT 31.0* 30.6* 31.3* 29.4* 30.3*  MCV 97.8 97.8 98.4 98.7 98.7  PLT 307 314 334 321 345   Basic Metabolic Panel: Recent Labs  Lab 12/07/23 0527 12/08/23 0423 12/09/23 0617 12/10/23 0628 12/11/23 0552  NA 136 135 138 140 139  K 4.1 4.1 4.3 4.4 4.4  CL 103 102 103 105 104  CO2 24 24 24 25 26   GLUCOSE 111* 94 98 110* 103*  BUN 24* 24* 25* 32* 30*  CREATININE 0.44* 0.47* 0.54* 0.59* 0.58*  CALCIUM  8.5* 8.5* 8.7* 7.6* 8.7*  MG 2.3 2.1 2.2 2.3 2.1  PHOS 3.6 3.7 4.2 4.3 3.6   GFR: Estimated Creatinine Clearance: 86.5 mL/min (A) (by C-G formula based on SCr of 0.58 mg/dL (L)). Liver Function Tests: Recent Labs  Lab 12/07/23 0527 12/11/23 0552  AST 22 25  ALT 33 24  ALKPHOS 117 135*  BILITOT 0.7 0.4  PROT 5.6* 5.9*  ALBUMIN  2.5* 2.6*   No results for input(s): LIPASE, AMYLASE in the last 168 hours. No results for input(s): AMMONIA in the last 168  hours. Coagulation Profile: No results for input(s): INR, PROTIME in the last 168 hours. Cardiac Enzymes: No results for input(s): CKTOTAL, CKMB, CKMBINDEX, TROPONINI in the last 168 hours. BNP (last 3 results) No results for input(s): PROBNP in the last 8760 hours. HbA1C: No results for input(s): HGBA1C in the last 72 hours. CBG: Recent Labs  Lab 12/07/23 0756 12/07/23 1539 12/07/23 2334 12/08/23 0756 12/08/23 1544  GLUCAP 105* 93 92 103* 87   Lipid Profile: Recent Labs    12/11/23 0553  TRIG 72   Thyroid  Function Tests: No results for input(s): TSH, T4TOTAL, FREET4, T3FREE, THYROIDAB in the last 72 hours. Anemia Panel: No results for input(s): VITAMINB12, FOLATE, FERRITIN, TIBC, IRON, RETICCTPCT in the last 72  hours. Sepsis Labs: No results for input(s): PROCALCITON, LATICACIDVEN in the last 168 hours.   Recent Results (from the past 240 hours)  Culture, blood (Routine X 2) w Reflex to ID Panel     Status: None   Collection Time: 12/05/23  5:24 AM   Specimen: BLOOD RIGHT HAND  Result Value Ref Range Status   Specimen Description   Final    BLOOD RIGHT HAND Performed at [facility], [address], [place], [place] [address]    Special Requests   Final    BOTTLES DRAWN AEROBIC AND ANAEROBIC Blood Culture results may not be optimal due to an inadequate volume of blood received in culture bottles Performed at [facility], [address], [place], [place] [address]    Culture   Final    NO GROWTH 5 DAYS Performed at [facility], [address] [place], [place] [address]    Report Status 12/10/2023 FINAL  Final  Culture, blood (Routine X 2) w Reflex to ID Panel     Status: None   Collection Time: 12/05/23  5:33 AM   Specimen: BLOOD  Result Value Ref Range Status   Specimen Description   Final    BLOOD SITE NOT SPECIFIED Performed at [facility], [address],  [place], [place] [address]    Special Requests   Final    BOTTLES DRAWN AEROBIC ONLY Blood Culture results may not be optimal due to an inadequate volume of blood received in culture bottles Performed at [facility], [address] [place], [place] [address]    Culture   Final    NO GROWTH 5 DAYS Performed at [facility], [address] [place], [place] [address]    Report Status 12/10/2023 FINAL  Final    Radiology Studies: DG Abd 1 View Result Date: 12/11/2023 CLINICAL DATA:  Ileus. EXAM: ABDOMEN - 1 VIEW COMPARISON:  Radiographs 12/09/2023 and 10/15/2023.  CT 12/05/2023. FINDINGS: 1008 hours. Two supine views the abdomen and pelvis are submitted. Persistent central small bowel distension, slightly increased compared with the most recent prior study from 2 days ago. Some gas remains within the colon. No supine evidence of pneumoperitoneum. Enteric tube is unchanged, projecting over the proximal stomach. No suspicious abdominal calcifications or acute osseous findings. There are stable degenerative changes in the spine. IMPRESSION: Slightly increased small bowel distension compared with the most recent prior study from 2 days ago. Findings remain most consistent with an ileus. Low-grade partial small bowel obstruction considered less likely. Electronically Signed   By: Elsie Perone M.D.   On: 12/11/2023 14:01   Scheduled Meds:  Chlorhexidine  Gluconate Cloth  6 each Topical Q2200   enoxaparin  (LOVENOX ) injection  1 mg/kg Subcutaneous Q12H   fluticasone   1 spray Each Nare Daily   metoprolol  tartrate  5 mg Intravenous Q8H   pantoprazole  (PROTONIX ) IV  40 mg Intravenous Q12H   polyethylene glycol  17 g Oral BID   sodium chloride  flush  10-40 mL Intracatheter Q12H   sodium chloride  flush  10-40 mL Intracatheter Q12H   Continuous Infusions:  TPN ADULT (ION) 100 mL/hr at 12/11/23 1842   TPN ADULT (ION)       LOS: 29 days    Time spent: 35 mins    Darcel Dawley, MD [facility]   If 7PM-7AM, please contact night-coverage

## 2023-12-12 NOTE — TOC Progression Note (Signed)
 Transition of Care [facility]) - Progression Note    Patient Details  Name: David Mcgrath MRN: [id number] Date of Birth: [birth year]  [facility]) CM/SW Contact  Heather DELENA Saltness, LCSW Phone Number: 12/12/2023, 9:41 AM  Clinical Narrative:    Pt screened by CIR for inpatient rehab. CIR will continue to follow, awaiting discharge of NG/TPN in order to request prior authorization from insurance. TOC will continue to follow.   Expected Discharge Plan: Skilled Nursing Facility Barriers to Discharge: Continued Medical Work up    Expected Discharge Plan and Services In-house Referral: NA Discharge Planning Services: CM Consult Post Acute Care Choice: Skilled Nursing Facility Living arrangements for the past 2 months: Single Family Home                 DME Arranged: N/A DME Agency: NA       HH Arranged: NA HH Agency: NA         Social Drivers of Health (SDOH) Interventions SDOH Screenings   Food Insecurity: No Food Insecurity (11/13/2023)  Housing: Low Risk  (11/13/2023)  Transportation Needs: No Transportation Needs (11/13/2023)  Utilities: Not At Risk (11/13/2023)  Depression (PHQ2-9): Low Risk  (11/09/2023)  Social Connections: Socially Integrated (11/13/2023)  Tobacco Use: Low Risk  (11/30/2023)    Readmission Risk Interventions    11/05/2023   10:31 AM 10/15/2023   11:45 AM  Readmission Risk Prevention Plan  Transportation Screening Complete Complete  PCP or Specialist Appt within 3-5 Days Complete Complete  HRI or Home Care Consult Complete Complete  Social Work Consult for Recovery Care Planning/Counseling Complete Complete  Palliative Care Screening Not Applicable Not Applicable  Medication Review Oceanographer) Complete Complete    Signed: Heather Saltness, MSW, LCSW Clinical Social Worker Inpatient Care Management 12/12/2023 9:42 AM

## 2023-12-12 NOTE — Progress Notes (Signed)
 PT Cancellation Note  Patient Details Name: David Mcgrath MRN: [id number] DOB: 05/01/1950   Cancelled Treatment:    Reason Eval/Treat Not Completed: Medical issues which prohibited therapy Has been given laxative, carline check back another time.  Darice Potters PT Acute Rehabilitation Services Office [phone]   Potters Darice Norris 12/12/2023, 11:55 AM

## 2023-12-12 NOTE — Progress Notes (Signed)
 Subjective: Feels exhausted and weak.  Objective: Vital signs in last 24 hours: Temp:  [97.6 F (36.4 C)-98.9 F (37.2 C)] 98.9 F (37.2 C) (09/09 0846) Pulse Rate:  [87-92] 87 (09/09 0846) Resp:  [18-22] 22 (09/09 0846) BP: (91-115)/(45-63) 111/63 (09/09 0846) SpO2:  [98 %-100 %] 100 % (09/09 0846) Weight:  [73.3 kg] 73.3 kg (09/09 0500) Weight change: -3.1 kg Last BM Date : 12/10/23  PE: NG tube draining bilious fluid GENERAL: Deconditioned appearing, prominent pallor ABDOMEN: Nondistended, sluggish bowel sounds EXTREMITIES: No deformity  Lab Results: Results for orders placed or performed during the hospital encounter of 11/12/23 (from the past 48 hours)  Comprehensive metabolic panel     Status: Abnormal   Collection Time: 12/11/23  5:52 AM  Result Value Ref Range   Sodium 139 135 - 145 mmol/L   Potassium 4.4 3.5 - 5.1 mmol/L   Chloride 104 98 - 111 mmol/L   CO2 26 22 - 32 mmol/L   Glucose, Bld 103 (H) 70 - 99 mg/dL    Comment: Glucose reference range applies only to samples taken after fasting for at least 8 hours.   BUN 30 (H) 8 - 23 mg/dL   Creatinine, Ser 9.41 (L) 0.61 - 1.24 mg/dL   Calcium  8.7 (L) 8.9 - 10.3 mg/dL   Total Protein 5.9 (L) 6.5 - 8.1 g/dL   Albumin  2.6 (L) 3.5 - 5.0 g/dL   AST 25 15 - 41 U/L   ALT 24 0 - 44 U/L   Alkaline Phosphatase 135 (H) 38 - 126 U/L   Total Bilirubin 0.4 0.0 - 1.2 mg/dL   GFR, Estimated >39 >39 mL/min    Comment: (NOTE) Calculated using the CKD-EPI Creatinine Equation (2021)    Anion gap 9 5 - 15    Comment: Performed at [facility], [address], [place], [place] [address]  Magnesium      Status: None   Collection Time: 12/11/23  5:52 AM  Result Value Ref Range   Magnesium  2.1 1.7 - 2.4 mg/dL    Comment: Performed at [facility], [address] [place], [place] [address]  Phosphorus     Status: None   Collection Time: 12/11/23  5:52 AM  Result Value Ref Range   Phosphorus  3.6 2.5 - 4.6 mg/dL    Comment: Performed at [facility], [address] [place], [place] [address]  Triglycerides     Status: None   Collection Time: 12/11/23  5:53 AM  Result Value Ref Range   Triglycerides 72 <150 mg/dL    Comment: Performed at [facility], [address], [place], [place] [address]  CBC     Status: Abnormal   Collection Time: 12/11/23  5:53 AM  Result Value Ref Range   WBC 5.3 4.0 - 10.5 K/uL   RBC 3.07 (L) 4.22 - 5.81 MIL/uL   Hemoglobin 9.4 (L) 13.0 - 17.0 g/dL   HCT 69.6 (L) 60.9 - 47.9 %   MCV 98.7 80.0 - 100.0 fL   MCH 30.6 26.0 - 34.0 pg   MCHC 31.0 30.0 - 36.0 g/dL   RDW 82.7 (H) 88.4 - 84.4 %   Platelets 345 150 - 400 K/uL   nRBC 0.0 0.0 - 0.2 %    Comment: Performed at [facility], [address] [place], [place] [address]    Studies/Results: DG Abd 1 View Result Date: 12/11/2023 CLINICAL DATA:  Ileus. EXAM: ABDOMEN -  1 VIEW COMPARISON:  Radiographs 12/09/2023 and 10/15/2023.  CT 12/05/2023. FINDINGS: 1008 hours. Two supine views the abdomen and pelvis are submitted. Persistent central small bowel distension, slightly increased compared with the most recent prior study from 2 days ago. Some gas remains within the colon. No supine evidence of pneumoperitoneum. Enteric tube is unchanged, projecting over the proximal stomach. No suspicious abdominal calcifications or acute osseous findings. There are stable degenerative changes in the spine. IMPRESSION: Slightly increased small bowel distension compared with the most recent prior study from 2 days ago. Findings remain most consistent with an ileus. Low-grade partial small bowel obstruction considered less likely. Electronically Signed   By: Elsie Perone M.D.   On: 12/11/2023 14:01    Medications: I have reviewed the patient's current medications.  Assessment: Prolonged ileus of unknown etiology NG output 1100 mL in 12 hours Abdominal x-ray  yesterday showed slightly increased small bowel distention compared with recent prior study from 2 days, most consistent with ileus, low-grade partial small bowel obstruction considered less likely Potassium 4.4, magnesium  2.1 Moderate malnutrition, total protein 5.9, albumin  2.6, on IV TPN  Mild dehydration, BUN 30, creatinine 0.58, GFR more than 60 Anemia, hemoglobin 9.4, stable  Comorbidities: Multiple myeloma diagnosed in March 2025, paroxysmal A-fib, Eliquis  on hold, polyneuropathy, hypertension, dyslipidemia  Plan: Will give Miralax  17 gm X 2 doses today.  I have asked his nurse to clamp NG tube for 30 minutes after giving miralax  and thereafter place NG tube back to low intermittent suction.  Will get Small bowel series tomorrow, as he has prolonged ileus, we need to evaluate for a transition point and consider partial small bowel obstruction.  Encourage mobilization, he has been in his bed for 2 days, recommend getting out of bed to chair and if possible participate in physical therapy.  Estelita Manas, MD 12/12/2023, 8:56 AM

## 2023-12-12 NOTE — Progress Notes (Addendum)
 PHARMACY - TOTAL PARENTERAL NUTRITION CONSULT NOTE   Indication: Prolonged ileus  Patient Measurements: Height: 6' 4 (193 cm) Weight: 73.3 kg (161 lb 9.6 oz) IBW/kg (Calculated) : 86.8 TPN AdjBW (KG): 94.6 Body mass index is 19.67 kg/m.  Assessment:  Pharmacy is consulted to start TPN on 73 yo male diagnosed with bowel obstruction. This admission CT abdomen shows increasing small bowel dilatation and fecalization of bowel contents involving the jejunum. No definitive transition point is identified at this time but caliber change is noted in the right mid abdomen at the junction of the jejunum and ileum. The more distal ileum is unremarkable. Some suspicion that bowel obstruction may be related to bortezomib  treatment pt receiving for multiple myeloma.   Glucose / Insulin : no hx of DM. Goal BG 140-180.  CBGs and SSI d/c'd on 9/3. Electrolytes: WNL including CorrCa 9.8 Renal: SCr < 1, BUN 32 Hepatic: LFTs WNL, alb 2.6; TG stable Intake / Output; MIVF:  -UOP adequate -NG output > 1 L yesterday, 700 ml documented this morning -LBM 9/7 GI Imaging: -Admitted from 10/14/23-10/20/23 for symptoms related to SBO.  CT: Transition point suspected in the right hemi-abdomen. Received non-operative management.  -Admitted 11/02/23 - 11/05/2023 for small bowel dilatation/concern for small bowel obstruction. CT imaging showed persistent small bowel dilatation with mild transition point noted deep in pelvis. He went to OR 11/03/23, findings were suspicious for intermittent volvulus of small bowel to explain his symptoms and CT imaging.  -8/10  CT abdomen shows increasing small bowel dilatation and fecalization of bowel contents involving the jejunum. No definitive transition point -9/4 Abd XR: likely improving ileus -9/6 Abd XR: concerning for ileus or possibly obstruction -9/8 Abd XR: slightly increased small bowel distension compared to most recent study, most consistent w/ an ileus GI Surgeries / Procedures:   -8/1 diagnostic laparoscopy -8/28: EGD, enteroscopy - small hiatal hernia, gastritis, duodenum/jejunum appeared normal; several biopsies taken  Central access:  PICC 8/18   Nutritional Goals: Goal TPN rate is 100 mL/hr (provides 132 g of protein and 2438 kcals per day)  RD Assessment:  Estimated Needs Total Energy Estimated Needs: 2400-2600 kcals Total Protein Estimated Needs: 120-135 grams Total Fluid Estimated Needs: >/= 2.4L  Current Nutrition:  NPO and TPN  Plan:  Continue TPN at 100 mL/hr Electrolytes in TPN:  Na 50 mEq/L  K 40 mEq/L Ca 2.5 mEq/L Mg 2.5 mEq/L Phos 5 mmol/L  Cl:Ac 1:1 Add standard MVI and trace elements to TPN Monitor TPN labs on Mon/Thurs Patient to get Miralax  x 2 doses today - clamp NG x 30 min and then back to suction   Thank you for allowing pharmacy to be a part of this patient's care.  Stefano MARLA Bologna, PharmD, BCPS Clinical Pharmacist 12/12/2023 9:30 AM

## 2023-12-13 ENCOUNTER — Inpatient Hospital Stay (HOSPITAL_COMMUNITY)

## 2023-12-13 DIAGNOSIS — K567 Ileus, unspecified: Secondary | ICD-10-CM | POA: Diagnosis not present

## 2023-12-13 LAB — CBC
HCT: 33.6 % — ABNORMAL LOW (ref 39.0–52.0)
Hemoglobin: 10.4 g/dL — ABNORMAL LOW (ref 13.0–17.0)
MCH: 30.3 pg (ref 26.0–34.0)
MCHC: 31 g/dL (ref 30.0–36.0)
MCV: 98 fL (ref 80.0–100.0)
Platelets: 372 K/uL (ref 150–400)
RBC: 3.43 MIL/uL — ABNORMAL LOW (ref 4.22–5.81)
RDW: 16.9 % — ABNORMAL HIGH (ref 11.5–15.5)
WBC: 6 K/uL (ref 4.0–10.5)
nRBC: 0 % (ref 0.0–0.2)

## 2023-12-13 LAB — COMPREHENSIVE METABOLIC PANEL WITH GFR
ALT: 32 U/L (ref 0–44)
AST: 40 U/L (ref 15–41)
Albumin: 2.9 g/dL — ABNORMAL LOW (ref 3.5–5.0)
Alkaline Phosphatase: 144 U/L — ABNORMAL HIGH (ref 38–126)
Anion gap: 12 (ref 5–15)
BUN: 52 mg/dL — ABNORMAL HIGH (ref 8–23)
CO2: 21 mmol/L — ABNORMAL LOW (ref 22–32)
Calcium: 8.8 mg/dL — ABNORMAL LOW (ref 8.9–10.3)
Chloride: 104 mmol/L (ref 98–111)
Creatinine, Ser: 0.88 mg/dL (ref 0.61–1.24)
GFR, Estimated: >60 mL/min (ref >60)
Glucose, Bld: 125 mg/dL — ABNORMAL HIGH (ref 70–99)
Potassium: 4.9 mmol/L (ref 3.5–5.1)
Sodium: 137 mmol/L (ref 135–145)
Total Bilirubin: 0.4 mg/dL (ref 0.0–1.2)
Total Protein: 6.8 g/dL (ref 6.5–8.1)

## 2023-12-13 LAB — URINALYSIS, ROUTINE W REFLEX MICROSCOPIC
Bilirubin Urine: NEGATIVE
Glucose, UA: NEGATIVE mg/dL
Hgb urine dipstick: NEGATIVE
Ketones, ur: NEGATIVE mg/dL
Nitrite: NEGATIVE
Protein, ur: NEGATIVE mg/dL
Specific Gravity, Urine: 1.023 (ref 1.005–1.030)
WBC, UA: >50 WBC/hpf (ref 0–5)
pH: 5 (ref 5.0–8.0)

## 2023-12-13 LAB — LACTIC ACID, PLASMA: Lactic Acid, Venous: 0.8 mmol/L (ref 0.5–1.9)

## 2023-12-13 MED ORDER — SODIUM CHLORIDE 0.9 % IV SOLN
2.0000 g | Freq: Three times a day (TID) | INTRAVENOUS | Status: DC
  Administered 2023-12-13 – 2023-12-16 (×9): 2 g via INTRAVENOUS
  Filled 2023-12-13 (×9): qty 12.5

## 2023-12-13 MED ORDER — FLUCONAZOLE IN SODIUM CHLORIDE 400-0.9 MG/200ML-% IV SOLN
400.0000 mg | INTRAVENOUS | Status: DC
  Administered 2023-12-14 – 2023-12-17 (×4): 400 mg via INTRAVENOUS
  Filled 2023-12-13 (×5): qty 200

## 2023-12-13 MED ORDER — FLUCONAZOLE IN SODIUM CHLORIDE 400-0.9 MG/200ML-% IV SOLN
400.0000 mg | INTRAVENOUS | Status: AC
  Administered 2023-12-13 (×2): 400 mg via INTRAVENOUS
  Filled 2023-12-13 (×2): qty 200

## 2023-12-13 MED ORDER — SODIUM CHLORIDE 0.9 % IV BOLUS
500.0000 mL | Freq: Once | INTRAVENOUS | Status: AC
  Administered 2023-12-13: 500 mL via INTRAVENOUS

## 2023-12-13 MED ORDER — DIATRIZOATE MEGLUMINE & SODIUM 66-10 % PO SOLN
90.0000 mL | Freq: Once | ORAL | Status: AC
  Administered 2023-12-13: 90 mL via NASOGASTRIC
  Filled 2023-12-13: qty 90

## 2023-12-13 MED ORDER — ACETAMINOPHEN 160 MG/5ML PO SOLN
650.0000 mg | Freq: Four times a day (QID) | ORAL | Status: DC | PRN
  Administered 2023-12-13: 650 mg
  Filled 2023-12-13: qty 20.3

## 2023-12-13 MED ORDER — ACETAMINOPHEN 650 MG RE SUPP: 650.0000 mg | Freq: Four times a day (QID) | RECTAL | Status: DC | PRN

## 2023-12-13 MED ORDER — VANCOMYCIN HCL IN DEXTROSE 1-5 GM/200ML-% IV SOLN
1000.0000 mg | Freq: Two times a day (BID) | INTRAVENOUS | Status: DC
  Administered 2023-12-13 – 2023-12-16 (×6): 1000 mg via INTRAVENOUS
  Filled 2023-12-13 (×6): qty 200

## 2023-12-13 MED ORDER — VANCOMYCIN HCL IN DEXTROSE 1-5 GM/200ML-% IV SOLN
1000.0000 mg | Freq: Once | INTRAVENOUS | Status: AC
Start: 2023-12-13 — End: 2023-12-13
  Administered 2023-12-13: 1000 mg via INTRAVENOUS
  Filled 2023-12-13: qty 200

## 2023-12-13 MED ORDER — TRAVASOL 10 % IV SOLN
INTRAVENOUS | Status: AC
  Filled 2023-12-13: qty 1320

## 2023-12-13 MED ORDER — FLUCONAZOLE IN SODIUM CHLORIDE 400-0.9 MG/200ML-% IV SOLN
800.0000 mg | Freq: Once | INTRAVENOUS | Status: DC
  Filled 2023-12-13: qty 400

## 2023-12-13 NOTE — Plan of Care (Signed)
  Problem: Education: Goal: Knowledge of General Education information will improve Description: Including pain rating scale, medication(s)/side effects and non-pharmacologic comfort measures Outcome: Progressing   Problem: Health Behavior/Discharge Planning: Goal: Ability to manage health-related needs will improve Outcome: Progressing   Problem: Clinical Measurements: Goal: Ability to maintain clinical measurements within normal limits will improve Outcome: Progressing Goal: Will remain free from infection Outcome: Progressing Goal: Diagnostic test results will improve Outcome: Progressing Goal: Respiratory complications will improve Outcome: Progressing Goal: Cardiovascular complication will be avoided Outcome: Progressing   Problem: Activity: Goal: Risk for activity intolerance will decrease Outcome: Progressing   Problem: Nutrition: Goal: Adequate nutrition will be maintained Outcome: Progressing   Problem: Elimination: Goal: Will not experience complications related to bowel motility Outcome: Progressing Goal: Will not experience complications related to urinary retention Outcome: Progressing   Problem: Pain Managment: Goal: General experience of comfort will improve and/or be controlled Outcome: Progressing   Problem: Safety: Goal: Ability to remain free from injury will improve Outcome: Progressing   Problem: Skin Integrity: Goal: Risk for impaired skin integrity will decrease Outcome: Progressing   Problem: Nutrition Goal: Patient maintains adequate hydration Outcome: Progressing Goal: Patient maintains weight Outcome: Progressing   Problem: Education: Goal: Individualized Educational Video(s) Outcome: Progressing   Problem: Coping: Goal: Ability to adjust to condition or change in health will improve Outcome: Progressing   Problem: Fluid Volume: Goal: Ability to maintain a balanced intake and output will improve Outcome: Progressing    Problem: Health Behavior/Discharge Planning: Goal: Ability to identify and utilize available resources and services will improve Outcome: Progressing Goal: Ability to manage health-related needs will improve Outcome: Progressing   Problem: Nutritional: Goal: Maintenance of adequate nutrition will improve Outcome: Progressing Goal: Progress toward achieving an optimal weight will improve Outcome: Progressing   Problem: Skin Integrity: Goal: Risk for impaired skin integrity will decrease Outcome: Progressing   Problem: Tissue Perfusion: Goal: Adequacy of tissue perfusion will improve Outcome: Progressing

## 2023-12-13 NOTE — Progress Notes (Signed)
 RED MEWS Progress Note  Patient Details Name: David Mcgrath MRN: [id number] DOB: [birth year] Today's Date: 12/13/2023   MEWS Flowsheet Documentation:  Assess: MEWS Score Temp: (!) 100.7 F (38.2 C) BP: 123/67 MAP (mmHg): 81 Pulse Rate: (!) 108 ECG Heart Rate: 100 Resp: (!) 30 Level of Consciousness: Alert SpO2: 99 % O2 Device: Room Air Patient Activity (if Appropriate): In bed O2 Flow Rate (L/min): 2 L/min Assess: MEWS Score MEWS Temp: 1 MEWS Systolic: 0 MEWS Pulse: 1 MEWS RR: 2 MEWS LOC: 0 MEWS Score: 4 MEWS Score Color: Red Assess: SIRS CRITERIA SIRS Temperature : 0 SIRS Respirations : 1 SIRS Pulse: 1 SIRS WBC: 0 SIRS Score Sum : 2       Bethel Sirois Alondra  Mozqueda Jaramillo 12/13/2023, 2:23 PM

## 2023-12-13 NOTE — Progress Notes (Signed)
 PHARMACY - TOTAL PARENTERAL NUTRITION CONSULT NOTE   Indication: Prolonged ileus  Patient Measurements: Height: 6' 4 (193 cm) Weight: 73.3 kg (161 lb 9.6 oz) IBW/kg (Calculated) : 86.8 TPN AdjBW (KG): 94.6 Body mass index is 19.67 kg/m.  Assessment:  Pharmacy is consulted to start TPN on 73 yo male diagnosed with bowel obstruction. This admission CT abdomen shows increasing small bowel dilatation and fecalization of bowel contents involving the jejunum. No definitive transition point is identified at this time but caliber change is noted in the right mid abdomen at the junction of the jejunum and ileum. The more distal ileum is unremarkable. Some suspicion that bowel obstruction may be related to bortezomib  treatment pt receiving for multiple myeloma.   Glucose / Insulin : no hx of DM. Goal BG 140-180.  CBGs and SSI d/c'd on 9/3. Electrolytes: WNL including CorrCa 9.8 Renal: SCr < 1, BUN 32 Hepatic: LFTs WNL, alb 2.6; TG stable Intake / Output; MIVF:  -UOP adequate -NG continues to have output > 1 L yesterday, 1900 ml documented last 24hr - plan for small bowel series today per notes -LBM 9/7 GI Imaging: -Admitted from 10/14/23-10/20/23 for symptoms related to SBO.  CT: Transition point suspected in the right hemi-abdomen. Received non-operative management.  -Admitted 11/02/23 - 11/05/2023 for small bowel dilatation/concern for small bowel obstruction. CT imaging showed persistent small bowel dilatation with mild transition point noted deep in pelvis. He went to OR 11/03/23, findings were suspicious for intermittent volvulus of small bowel to explain his symptoms and CT imaging.  -8/10  CT abdomen shows increasing small bowel dilatation and fecalization of bowel contents involving the jejunum. No definitive transition point -9/4 Abd XR: likely improving ileus -9/6 Abd XR: concerning for ileus or possibly obstruction -9/8 Abd XR: slightly increased small bowel distension compared to most recent  study, most consistent w/ an ileus GI Surgeries / Procedures:  -8/1 diagnostic laparoscopy -8/28: EGD, enteroscopy - small hiatal hernia, gastritis, duodenum/jejunum appeared normal; several biopsies taken  Central access:  PICC 8/18   Nutritional Goals: Goal TPN rate is 100 mL/hr (provides 132 g of protein and 2438 kcals per day)  RD Assessment:  Estimated Needs Total Energy Estimated Needs: 2400-2600 kcals Total Protein Estimated Needs: 120-135 grams Total Fluid Estimated Needs: >/= 2.4L  Current Nutrition:  NPO and TPN  Plan:  Continue TPN at 100 mL/hr Electrolytes in TPN:  Na 50 mEq/L  K 40 mEq/L Ca 2.5 mEq/L Mg 2.5 mEq/L Phos 5 mmol/L  Cl:Ac 1:1 Add standard MVI and trace elements to TPN Monitor TPN labs on Mon/Thurs    Britta Eva Na, PharmD, BCPS Clinical Pharmacist 12/13/2023 10:48 AM

## 2023-12-13 NOTE — Progress Notes (Signed)
 OT Cancellation Note  Patient Details Name: Jahlon Baines MRN: [id number] DOB: [birth year]   Cancelled Treatment:    Reason Eval/Treat Not Completed: Patient declined. Pt stated, we're just not doing it today. I hope you understand. I have a procedure in 12 hours I need to be prepared for.   Delanna JINNY Lesches, OTR/L 12/13/2023, 11:53 AM

## 2023-12-13 NOTE — Plan of Care (Addendum)
 MEWS Progress Note  Patient Details Name: David Mcgrath MRN: [id number] DOB: [birth year] Today's Date: 12/13/2023   MEWS Flowsheet Documentation:  Assess: MEWS Score Temp: 100 F (37.8 C) BP: 122/68 MAP (mmHg): 82 Pulse Rate: (!) 109 ECG Heart Rate: 100 Resp: (!) 25 Level of Consciousness: Alert SpO2: 98 % O2 Device: Room Air Patient Activity (if Appropriate): In bed O2 Flow Rate (L/min): 2 L/min Assess: MEWS Score MEWS Temp: 0 MEWS Systolic: 0 MEWS Pulse: 1 MEWS RR: 1 MEWS LOC: 0 MEWS Score: 2 MEWS Score Color: Yellow Assess: SIRS CRITERIA SIRS Temperature : 0 SIRS Respirations : 1 SIRS Pulse: 1 SIRS WBC: 0 SIRS Score Sum : 2 SIRS Temperature : 0 SIRS Pulse: 1 SIRS Respirations : 1 SIRS WBC: 0 SIRS Score Sum : 2  MD [place] notified. David Bracy Alondra  Mozqueda Mcgrath 12/13/2023, 12:29 PM  MD notifed of pt's temp of 100F, tachycardic 110's, RR 25-30's. Denies pain & multiple PVC's found on tele. RN called tele to confirm PVC's and strip saved in chart.  Orders placed for Tylenol , Chest x-ray, 500 mL normal saline bolus & close monitoring. Patient provided with cold wash cloth on his forehead to help bring temperature down. Overall, pt is compliant, declines any pain, resting in bed. Call bell within range and patient's door open for close supervision. Will continue to monitor.  Problem: Education: Goal: Knowledge of General Education information will improve Description: Including pain rating scale, medication(s)/side effects and non-pharmacologic comfort measures Outcome: Progressing   Problem: Health Behavior/Discharge Planning: Goal: Ability to manage health-related needs will improve Outcome: Progressing   Problem: Pain Managment: Goal: General experience of comfort will improve and/or be controlled Outcome: Progressing   Problem: Safety: Goal: Ability to remain free from injury will improve Outcome: Progressing   Problem: Activity: Goal:  Risk for activity intolerance will decrease Outcome: Not Progressing   Problem: Nutrition: Goal: Adequate nutrition will be maintained Outcome: Not Progressing   Problem: Coping: Goal: Level of anxiety will decrease Outcome: Not Progressing

## 2023-12-13 NOTE — Progress Notes (Addendum)
 Pharmacy Antibiotic Note  David Mcgrath is a 73 y.o. male admitted on 11/12/2023 with bowel obstruction; started on TPN 8/18. Today patient noted to have low-grade fever in setting of prolonged TPN, and Pharmacy consulted for Vancomycin , Fluconazole , and Cefepime  dosing.  Plan: Vancomycin  1000 mg IV q12 hr (est AUC 427 based on SCr 0.8; Vd 0.72) Measure vancomycin  AUC at steady state as indicated SCr q48 while on vanc MRSA PCR ordered; f/u and narrow vanc as appropriate Cefepime  2 g IV q8 hr Diflucan  800 mg IV x 1 today, followed by 400 mg IV q24 hr starting tomorrow Low threshold to discontinue Diflucan  if no fungal growth on BCx over next 48 hr Further dosing of Cefepime /Diflucan  per standing pharmacy protocol  Height: 6' 4 (193 cm) Weight: 73.3 kg (161 lb 9.6 oz) IBW/kg (Calculated) : 86.8  Temp (24hrs), Avg:99.4 F (37.4 C), Min:97.3 F (36.3 C), Max:100.7 F (38.2 C)  Recent Labs  Lab 12/07/23 0527 12/08/23 0423 12/09/23 0617 12/10/23 0628 12/11/23 0552 12/11/23 0553 12/13/23 1429  WBC 6.9 6.0 5.8 5.1  --  5.3  --   CREATININE 0.44* 0.47* 0.54* 0.59* 0.58*  --   --   LATICACIDVEN  --   --   --   --   --   --  0.8    Estimated Creatinine Clearance: 86.5 mL/min (A) (by C-G formula based on SCr of 0.58 mg/dL (L)).    Allergies  Allergen Reactions   Shrimp [Shellfish Allergy] Anaphylaxis and Swelling    Swelling throat    Reglan  [Metoclopramide ] Other (See Comments)    caused tardive dyskinesia    Antimicrobials this admission: 9/10 Vancomycin  >>  9/10 Cefepime  >>  9/10 Fluconazole  >>   Dose adjustments this admission: N/a  Microbiology results: 9/2 BCx: NGF 9/10 BCx: sent 9/10 UCx: sent   Thank you for allowing pharmacy to be a part of this patient's care.  Craigory Toste A 12/13/2023 3:36 PM

## 2023-12-13 NOTE — Progress Notes (Signed)
 PT Cancellation Note  Patient Details Name: Greydon Betke MRN: [id number] DOB: [birth year]   Cancelled Treatment:    Reason Eval/Treat Not Completed: Medical issues which prohibited therapy Declined earlier, now with nursing.  Darice Potters PT Acute Rehabilitation Services Office [phone]   Potters Darice Norris 12/13/2023, 3:32 PM

## 2023-12-13 NOTE — Progress Notes (Signed)
 PROGRESS NOTE    David Mcgrath  FMW:[id number] DOB: 04/18/1950 DOA: 11/12/2023 PCP: [name], DO    Brief Narrative:   David Mcgrath is a 73 y.o. male with past medical history significant for HTN, HLD, paroxysmal atrial fibrillation on Eliquis , anxiety/depression, multiple myeloma (diagnosed March 2025) and received Bortezomib  infusion 11/09/2023, polyneuropathy who presented to [facility] ED on 11/12/2023 with worsening abdominal pain/distention.  Evaluation in the ED with imaging noted to have persistent ileus.  General surgery and GI consulted, underwent upper GI series and small bowel follow-through with confirm ration of delayed gastric emptying; although notable contrast in the rectum confirms not complete obstruction.  Additionally underwent endoscopy and exploratory laparoscopy with no clear findings of radiology for his symptoms.  Remains on low intermittent suction via NG tube with TPN for nutrition.  Initial plan for possible IR placement of GJ tube, unable to be completed due to poor windows.  Assessment & Plan:   Sepsis of unclear etiology Patient with elevated temperature of 100.7 F, tachycardic, tachypneic today.  Complicated by TPN use with PICC line in place.  Repeat labs with WBC count 6.0, lactic acid 0.8. -- Urinalysis, urine culture ordered -- Blood cultures x 2: Ordered -- Start empiric antibiotics with vancomycin , cefepime , fluconazole  -- Supportive care, monitor fever curve, repeat labs in the a.m.  Ileus, prolonged Partial small bowel obstruction Severe constipation Patient returning to the ED with worsening abdominal pain, distention.  Imaging notable for continued ileus, small bowel obstruction unlikely given contrast continues to the colon with large stool burden noted.  Patient underwent EGD, exploratory laparoscopy without any clear findings of supine imaging or symptoms.  IR unable to place GJ tube due to poor windows.  Patient did not  tolerate erythromycin  trial.  Avoid neostigmine per GI, also patient did not tolerate Reglan  due to side effect (tardive dyskinesia).  Had acute onset abdominal pain on 9/6 with large bowel movement after Linzess  administration. -- GI following, appreciate assistance -- Currently undergoing small bowel follow-through study -- Continue NG tube to LWIS; 1900 mL output past 24 hours; continue to monitor -- Continue TPN --Compazine /Zofran  as needed nausea/vomiting -- Lactulose  enema twice daily as needed severe constipation  Severe protein calorie malnutrition Body mass index is 19.67 kg/m.  Nutrition Status: Nutrition Problem: Severe Malnutrition Etiology: acute illness (recurrent small bowel obstruction) Signs/Symptoms: severe fat depletion, severe muscle depletion, energy intake < or equal to 50% for > or equal to 5 days, percent weight loss (22% in 1 month) Percent weight loss: 22 % (in 1 month) Interventions: Refer to RD note for recommendations, TPN -- Continue TPN  Normocytic anemia Anemia panel with iron 17, TIBC 185, ferritin 196, folate 8.2, vitamin B12 984.  Patient transfused 2 unit PRBCs on 12/05/2023. -- Hgb 12.6>>>>7.1>9.4>10.4 -- CBC in the am  Hypokalemia Hypomagnesemia Repleted.  Currently on TPN  Multiple myeloma Follows with medical oncology outpatient, Dr. Federico.    HTN Paroxysmal atrial fibrillation Not on rate controlling/antihypertensives at baseline.  On Eliquis  at baseline. --Continue Lovenox  80 mg subcutaneously every 12 hours   HLD Holding home Zetia , pravastatin  due to requirement of NG tube to suction   Anxiety/depression Holding home Prozac  due to requirement of NG tube to suction   Peripheral neuropathy Holding home gabapentin    GERD: -- Protonix  40 mg IV every 12h   Moderate protein calorie malnutrition  DVT prophylaxis: Place and maintain sequential compression device Start: 11/16/23 1142    Code Status: Full Code  Family Communication:  Spouse present at bedside this morning  Disposition Plan:  Level of care: Telemetry Status is: Inpatient Remains inpatient appropriate because: On TPN    Consultants:  [facility] General surgery IR  Procedures:  Small bowel enteroscopy, Dr. Rosalie 8/28  Antimicrobials:  Vancomycin  9/10>> Cefepime  9/10>> Fluconazole  9/10>>   Subjective: Patient seen examined bedside, lying in bed.  Spouse present at bedside.  Remains weak, declines working with therapy today.  Temperature elevated 200.7 with tachypnea and tachycardia.  Repeat chest x-ray, relatively unrevealing with normal white blood cell count and normal lactic acid.  Given patient is on TPN, concern for developing sepsis of unclear etiology.  Repeating blood culture, urine culture.  Starting empiric antibiotics with vancomycin , cefepime , metronidazole.  Overall prolonged hospitalization with no to little improvement of his symptoms.  Overall prognosis appears to be poor as patient is becoming more frail and weak.  Currently undergoing small bowel follow-through study per GI today.  Patient and spouse with no other questions or concerns at this time.  Patient denies headache, no chest pain, no shortness of breath, no abdominal pain, no current nausea/vomiting/diarrhea.  No acute events overnight per nursing.  Objective: Vitals:   12/13/23 1309 12/13/23 1405 12/13/23 1419 12/13/23 1427  BP: 105/66 (!) 109/56 123/67 114/67  Pulse: (!) 113 (!) 111 (!) 108 (!) 110  Resp: (!) 25 (!) 30  (!) 25  Temp: 99.9 F (37.7 C) (!) 100.7 F (38.2 C)  (!) 97.3 F (36.3 C)  TempSrc: Oral   Oral  SpO2: 98% 98% 99% 99%  Weight:      Height:        Intake/Output Summary (Last 24 hours) at 12/13/2023 1701 Last data filed at 12/13/2023 1612 Gross per 24 hour  Intake 3127.04 ml  Output 4250 ml  Net -1122.96 ml   Filed Weights   12/08/23 0426 12/11/23 0500 12/12/23 0500  Weight: 79.8 kg 76.4 kg 73.3 kg    Examination:  Physical Exam: GEN:  NAD, alert and oriented x 3, wd/wn HEENT: NCAT, PERRL, EOMI, sclera clear, MMM PULM: CTAB w/o wheezes/crackles, normal respiratory effort CV: RRR w/o M/G/R GI: abd soft, NTND, NABS, no R/G/M MSK: no peripheral edema, muscle strength globally intact 5/5 bilateral upper/lower extremities NEURO: CN II-XII intact, no focal deficits, sensation to light touch intact PSYCH: normal mood/affect Integumentary: dry/intact, no rashes or wounds    Data Reviewed: I have personally reviewed following labs and imaging studies  CBC: Recent Labs  Lab 12/08/23 0423 12/09/23 0617 12/10/23 0628 12/11/23 0553 12/13/23 1537  WBC 6.0 5.8 5.1 5.3 6.0  HGB 9.8* 9.7* 9.5* 9.4* 10.4*  HCT 30.6* 31.3* 29.4* 30.3* 33.6*  MCV 97.8 98.4 98.7 98.7 98.0  PLT 314 334 321 345 372   Basic Metabolic Panel: Recent Labs  Lab 12/07/23 0527 12/08/23 0423 12/09/23 0617 12/10/23 0628 12/11/23 0552 12/13/23 1537  NA 136 135 138 140 139 137  K 4.1 4.1 4.3 4.4 4.4 4.9  CL 103 102 103 105 104 104  CO2 24 24 24 25 26  21*  GLUCOSE 111* 94 98 110* 103* 125*  BUN 24* 24* 25* 32* 30* 52*  CREATININE 0.44* 0.47* 0.54* 0.59* 0.58* 0.88  CALCIUM  8.5* 8.5* 8.7* 7.6* 8.7* 8.8*  MG 2.3 2.1 2.2 2.3 2.1  --   PHOS 3.6 3.7 4.2 4.3 3.6  --    GFR: Estimated Creatinine Clearance: 78.7 mL/min (by C-G formula based on SCr of 0.88 mg/dL). Liver Function Tests: Recent  Labs  Lab 12/07/23 0527 12/11/23 0552 12/13/23 1537  AST 22 25 40  ALT 33 24 32  ALKPHOS 117 135* 144*  BILITOT 0.7 0.4 0.4  PROT 5.6* 5.9* 6.8  ALBUMIN  2.5* 2.6* 2.9*   No results for input(s): LIPASE, AMYLASE in the last 168 hours. No results for input(s): AMMONIA in the last 168 hours. Coagulation Profile: No results for input(s): INR, PROTIME in the last 168 hours. Cardiac Enzymes: No results for input(s): CKTOTAL, CKMB, CKMBINDEX, TROPONINI in the last 168 hours. BNP (last 3 results) No results for input(s): PROBNP in the  last 8760 hours. HbA1C: No results for input(s): HGBA1C in the last 72 hours. CBG: Recent Labs  Lab 12/07/23 0756 12/07/23 1539 12/07/23 2334 12/08/23 0756 12/08/23 1544  GLUCAP 105* 93 92 103* 87   Lipid Profile: Recent Labs    12/11/23 0553  TRIG 72   Thyroid  Function Tests: No results for input(s): TSH, T4TOTAL, FREET4, T3FREE, THYROIDAB in the last 72 hours. Anemia Panel: No results for input(s): VITAMINB12, FOLATE, FERRITIN, TIBC, IRON, RETICCTPCT in the last 72 hours. Sepsis Labs: Recent Labs  Lab 12/13/23 1429  LATICACIDVEN 0.8    Recent Results (from the past 240 hours)  Culture, blood (Routine X 2) w Reflex to ID Panel     Status: None   Collection Time: 12/05/23  5:24 AM   Specimen: BLOOD RIGHT HAND  Result Value Ref Range Status   Specimen Description   Final    BLOOD RIGHT HAND Performed at [facility], [address] [place], [place] [address]    Special Requests   Final    BOTTLES DRAWN AEROBIC AND ANAEROBIC Blood Culture results may not be optimal due to an inadequate volume of blood received in culture bottles Performed at [facility], [address], [place], [place] [address]    Culture   Final    NO GROWTH 5 DAYS Performed at [facility], [address], [place], [place] [address]    Report Status 12/10/2023 FINAL  Final  Culture, blood (Routine X 2) w Reflex to ID Panel     Status: None   Collection Time: 12/05/23  5:33 AM   Specimen: BLOOD  Result Value Ref Range Status   Specimen Description   Final    BLOOD SITE NOT SPECIFIED Performed at [facility], [address] [place], [place] [address]    Special Requests   Final    BOTTLES DRAWN AEROBIC ONLY Blood Culture results may not be optimal due to an inadequate volume of blood received in culture bottles Performed at [facility], [address] [place], [place] [address]    Culture    Final    NO GROWTH 5 DAYS Performed at [facility], [address] [place], [place] [address]    Report Status 12/10/2023 FINAL  Final         Radiology Studies: DG CHEST PORT 1 VIEW Result Date: 12/13/2023 CLINICAL DATA:  [id number] SOB (shortness of breath) [id number] EXAM: PORTABLE CHEST - 1 VIEW COMPARISON:  11/18/2023 FINDINGS: Esophagogastric tube is well positioned terminating in the stomach. Left PICC in place terminating in the high right atrium. Low lung volumes with bronchovascular crowding. Streaky left basilar atelectasis. No focal airspace consolidation, pleural effusion, or pneumothorax. No cardiomegaly. Aortic atherosclerosis. IMPRESSION: 1. Low lung volumes with streaky left basilar atelectasis. 2. Well-positioned esophagogastric tube terminating in the stomach. Electronically Signed  By: Rogelia Myers M.D.   On: 12/13/2023 14:34        Scheduled Meds:  Chlorhexidine  Gluconate Cloth  6 each Topical Q2200   enoxaparin  (LOVENOX ) injection  1 mg/kg Subcutaneous Q12H   fluticasone   1 spray Each Nare Daily   metoprolol  tartrate  5 mg Intravenous Q8H   pantoprazole  (PROTONIX ) IV  40 mg Intravenous Q12H   sodium chloride  flush  10-40 mL Intracatheter Q12H   sodium chloride  flush  10-40 mL Intracatheter Q12H   Continuous Infusions:  ceFEPime  (MAXIPIME ) IV 200 mL/hr at 12/13/23 1612   [START ON 12/14/2023] fluconazole  (DIFLUCAN ) IV     fluconazole  (DIFLUCAN ) IV 400 mg (12/13/23 1626)   TPN ADULT (ION) 100 mL/hr at 12/13/23 1612   TPN ADULT (ION)     vancomycin      vancomycin  200 mL/hr at 12/13/23 1612     LOS: 30 days    Time spent: 56 minutes spent on 12/13/2023 caring for this patient face-to-face including chart review, ordering labs/tests, documenting, discussion with nursing staff, consultants, updating family and interview/physical exam    Camellia PARAS [place], DO [facility] Available via Epic secure chat 7am-7pm After these hours, please refer to  coverage provider listed on [email or web address] 12/13/2023, 5:01 PM

## 2023-12-13 NOTE — Progress Notes (Signed)
 Subjective: Appears extremely weak.  Objective: Vital signs in last 24 hours: Temp:  [98 F (36.7 C)-100.2 F (37.9 C)] 100.2 F (37.9 C) (09/10 0443) Pulse Rate:  [100-107] 107 (09/10 0443) Resp:  [18-23] 18 (09/10 0443) BP: (102-110)/(61-65) 102/65 (09/10 0443) SpO2:  [95 %-100 %] 98 % (09/10 0443) Weight change:  Last BM Date : 12/10/23  PE: Deconditioned appearing GENERAL: Prominent pallor, nonicteric  ABDOMEN: NG tube currently clamped as he is getting ready for small bowel obstruction protocol x-ray with delayed images, nontender EXTREMITIES: No deformity  Lab Results: No results found for this or any previous visit (from the past 48 hours).  Studies/Results: No results found.  Medications: I have reviewed the patient's current medications.  Assessment: Prolonged ileus of unclear etiology He has been on NG tube to low intermittent suction for a prolonged. He has been on IV TPN. NG tube output continues to be high, 1900 mL in the last 12 hours. He was given MiraLAX  2 doses without any bowel movements.  Plan: Patient is getting all bowel obstruction protocol images, 8-hour delay images to identify if there is a transition point in the small bowel causing prolonged ileus.  Further management depends upon x-ray findings.   Estelita Manas, MD 12/13/2023, 11:04 AM

## 2023-12-14 ENCOUNTER — Ambulatory Visit: Admitting: Hematology and Oncology

## 2023-12-14 ENCOUNTER — Inpatient Hospital Stay

## 2023-12-14 ENCOUNTER — Inpatient Hospital Stay (HOSPITAL_COMMUNITY)

## 2023-12-14 DIAGNOSIS — K6389 Other specified diseases of intestine: Secondary | ICD-10-CM | POA: Diagnosis not present

## 2023-12-14 DIAGNOSIS — K567 Ileus, unspecified: Secondary | ICD-10-CM | POA: Diagnosis not present

## 2023-12-14 LAB — CBC
HCT: 33.7 % — ABNORMAL LOW (ref 39.0–52.0)
Hemoglobin: 10.2 g/dL — ABNORMAL LOW (ref 13.0–17.0)
MCH: 29.8 pg (ref 26.0–34.0)
MCHC: 30.3 g/dL (ref 30.0–36.0)
MCV: 98.5 fL (ref 80.0–100.0)
Platelets: 364 K/uL (ref 150–400)
RBC: 3.42 MIL/uL — ABNORMAL LOW (ref 4.22–5.81)
RDW: 16.6 % — ABNORMAL HIGH (ref 11.5–15.5)
WBC: 7.4 K/uL (ref 4.0–10.5)
nRBC: 0 % (ref 0.0–0.2)

## 2023-12-14 LAB — COMPREHENSIVE METABOLIC PANEL WITH GFR
ALT: 44 U/L (ref 0–44)
AST: 47 U/L — ABNORMAL HIGH (ref 15–41)
Albumin: 2.8 g/dL — ABNORMAL LOW (ref 3.5–5.0)
Alkaline Phosphatase: 139 U/L — ABNORMAL HIGH (ref 38–126)
Anion gap: 11 (ref 5–15)
BUN: 40 mg/dL — ABNORMAL HIGH (ref 8–23)
CO2: 18 mmol/L — ABNORMAL LOW (ref 22–32)
Calcium: 8.9 mg/dL (ref 8.9–10.3)
Chloride: 107 mmol/L (ref 98–111)
Creatinine, Ser: 0.64 mg/dL (ref 0.61–1.24)
GFR, Estimated: >60 mL/min (ref >60)
Glucose, Bld: 115 mg/dL — ABNORMAL HIGH (ref 70–99)
Potassium: 4.6 mmol/L (ref 3.5–5.1)
Sodium: 136 mmol/L (ref 135–145)
Total Bilirubin: 0.4 mg/dL (ref 0.0–1.2)
Total Protein: 6.5 g/dL (ref 6.5–8.1)

## 2023-12-14 LAB — GLUCOSE, CAPILLARY
Glucose-Capillary: 109 mg/dL — ABNORMAL HIGH (ref 70–99)
Glucose-Capillary: 110 mg/dL — ABNORMAL HIGH (ref 70–99)
Glucose-Capillary: 78 mg/dL (ref 70–99)
Glucose-Capillary: 99 mg/dL (ref 70–99)

## 2023-12-14 LAB — MAGNESIUM: Magnesium: 2 mg/dL (ref 1.7–2.4)

## 2023-12-14 LAB — PROCALCITONIN: Procalcitonin: 0.12 ng/mL

## 2023-12-14 LAB — PHOSPHORUS: Phosphorus: 3.6 mg/dL (ref 2.5–4.6)

## 2023-12-14 MED ORDER — POLYETHYLENE GLYCOL 3350 17 G PO PACK
17.0000 g | PACK | Freq: Three times a day (TID) | ORAL | Status: DC
  Administered 2023-12-14 – 2023-12-19 (×12): 17 g via ORAL
  Filled 2023-12-14 (×16): qty 1

## 2023-12-14 MED ORDER — IOHEXOL 300 MG/ML  SOLN
100.0000 mL | Freq: Once | INTRAMUSCULAR | Status: AC | PRN
  Administered 2023-12-14: 100 mL via INTRAVENOUS

## 2023-12-14 MED ORDER — STERILE WATER FOR INJECTION IJ SOLN
INTRAMUSCULAR | Status: AC
  Administered 2023-12-14: 10 mL
  Filled 2023-12-14: qty 10

## 2023-12-14 MED ORDER — TRAVASOL 10 % IV SOLN
INTRAVENOUS | Status: AC
  Filled 2023-12-14: qty 1320

## 2023-12-14 NOTE — Progress Notes (Signed)
 PHARMACY - TOTAL PARENTERAL NUTRITION CONSULT NOTE   Indication: Prolonged ileus  Patient Measurements: Height: 6' 4 (193 cm) Weight: 73.3 kg (161 lb 9.6 oz) IBW/kg (Calculated) : 86.8 TPN AdjBW (KG): 94.6 Body mass index is 19.67 kg/m.  Assessment:  Pharmacy is consulted to start TPN on 73 yo male diagnosed with bowel obstruction. This admission CT abdomen shows increasing small bowel dilatation and fecalization of bowel contents involving the jejunum. No definitive transition point is identified at this time but caliber change is noted in the right mid abdomen at the junction of the jejunum and ileum. The more distal ileum is unremarkable. Some suspicion that bowel obstruction may be related to bortezomib  treatment pt receiving for multiple myeloma.   Glucose / Insulin : no hx of DM. Goal BG 140-180.  CBGs and SSI d/c'd on 9/3. Electrolytes: WNL including CorrCa 9.8 Renal: SCr < 1, BUN 40 Hepatic: LFTs WNL, alb 2.8; TG stable Intake / Output; MIVF:  -UOP adequate -NG continues to have output > 1 L/24 hr -LBM 9/10 GI Imaging: -Admitted from 10/14/23-10/20/23 for symptoms related to SBO.  CT: Transition point suspected in the right hemi-abdomen. Received non-operative management.  -Admitted 11/02/23 - 11/05/2023 for small bowel dilatation/concern for small bowel obstruction. CT imaging showed persistent small bowel dilatation with mild transition point noted deep in pelvis. He went to OR 11/03/23, findings were suspicious for intermittent volvulus of small bowel to explain his symptoms and CT imaging.  -8/10  CT abdomen shows increasing small bowel dilatation and fecalization of bowel contents involving the jejunum. No definitive transition point -9/4 Abd XR: likely improving ileus -9/6 Abd XR: concerning for ileus or possibly obstruction -9/8 Abd XR: slightly increased small bowel distension compared to most recent study, most consistent w/ an ileus -9/10 Abd XR: interval worsening of small  bowel dilatation -9/11 Abd XR: persistent distended small-bowel loops, with a loop in the left hemiabdomen measuring up to 5.2 cm, consistent with ileus GI Surgeries / Procedures:  -8/1 diagnostic laparoscopy -8/28: EGD, enteroscopy - small hiatal hernia, gastritis, duodenum/jejunum appeared normal; several biopsies taken  Central access:  PICC 8/18   Nutritional Goals: Goal TPN rate is 100 mL/hr (provides 132 g of protein and 2438 kcals per day)  RD Assessment:  Estimated Needs Total Energy Estimated Needs: 2400-2600 kcals Total Protein Estimated Needs: 120-135 grams Total Fluid Estimated Needs: >/= 2.4L  Current Nutrition:  NPO and TPN  Plan:  Continue TPN at 100 mL/hr Electrolytes in TPN:  Na 50 mEq/L  K 40 mEq/L Ca 2.5 mEq/L Mg 2.5 mEq/L Phos 5 mmol/L  Cl:Ac 1:1 Add standard MVI and trace elements to TPN Monitor TPN labs on Mon/Thurs    Stefano MARLA Bologna, PharmD, BCPS Clinical Pharmacist 12/14/2023 12:29 PM

## 2023-12-14 NOTE — Progress Notes (Signed)
 Subjective: As per his night nurse, he had 3 liquid bowel movements overnight. NG tube return is 1400 mL in the last 12 hours.  Objective: Vital signs in last 24 hours: Temp:  [97.3 F (36.3 C)-100.7 F (38.2 C)] 98.6 F (37 C) (09/11 0436) Pulse Rate:  [79-113] 79 (09/11 0436) Resp:  [16-30] 16 (09/11 0436) BP: (100-130)/(54-70) 108/63 (09/11 0436) SpO2:  [98 %-100 %] 98 % (09/11 0436) Weight change:  Last BM Date : 12/13/23  PE: Ill-appearing GENERAL: NG tube connected to intermittent wall suction draining bilious fluid  ABDOMEN: Nondistended, bowel sounds audible, nontender EXTREMITIES: No deformity  Lab Results: Results for orders placed or performed during the hospital encounter of 11/12/23 (from the past 48 hours)  Lactic acid, plasma     Status: None   Collection Time: 12/13/23  2:29 PM  Result Value Ref Range   Lactic Acid, Venous 0.8 0.5 - 1.9 mmol/L    Comment: Performed at [facility], [address] [place], [place] [address]  Culture, blood (Routine X 2) w Reflex to ID Panel     Status: None (Preliminary result)   Collection Time: 12/13/23  3:33 PM   Specimen: BLOOD RIGHT ARM  Result Value Ref Range   Specimen Description      BLOOD RIGHT ARM Performed at [facility], [address], [place], [place] [address]    Special Requests      BOTTLES DRAWN AEROBIC AND ANAEROBIC Blood Culture adequate volume Performed at [facility], [address] [place], [place] [address]    Culture PENDING    Report Status PENDING   Culture, blood (Routine X 2) w Reflex to ID Panel     Status: None (Preliminary result)   Collection Time: 12/13/23  3:35 PM   Specimen: BLOOD RIGHT HAND  Result Value Ref Range   Specimen Description      BLOOD RIGHT HAND Performed at [facility], [address] [place], [place] [address]    Special Requests      BOTTLES DRAWN AEROBIC AND ANAEROBIC Blood Culture adequate volume Performed  at [facility], [address] [place], [place] [address]    Culture PENDING    Report Status PENDING   CBC     Status: Abnormal   Collection Time: 12/13/23  3:37 PM  Result Value Ref Range   WBC 6.0 4.0 - 10.5 K/uL   RBC 3.43 (L) 4.22 - 5.81 MIL/uL   Hemoglobin 10.4 (L) 13.0 - 17.0 g/dL   HCT 66.3 (L) 60.9 - 47.9 %   MCV 98.0 80.0 - 100.0 fL   MCH 30.3 26.0 - 34.0 pg   MCHC 31.0 30.0 - 36.0 g/dL   RDW 83.0 (H) 88.4 - 84.4 %   Platelets 372 150 - 400 K/uL   nRBC 0.0 0.0 - 0.2 %    Comment: Performed at [facility], [address] [place], [place] [address]  Comprehensive metabolic panel     Status: Abnormal   Collection Time: 12/13/23  3:37 PM  Result Value Ref Range   Sodium 137 135 - 145 mmol/L   Potassium 4.9 3.5 - 5.1 mmol/L   Chloride 104 98 - 111 mmol/L   CO2 21 (L) 22 - 32 mmol/L   Glucose, Bld 125 (H) 70 - 99 mg/dL    Comment: Glucose reference range applies only to samples taken after fasting for at least 8 hours.  BUN 52 (H) 8 - 23 mg/dL   Creatinine, Ser 9.11 0.61 - 1.24 mg/dL   Calcium  8.8 (L) 8.9 - 10.3 mg/dL   Total Protein 6.8 6.5 - 8.1 g/dL   Albumin  2.9 (L) 3.5 - 5.0 g/dL   AST 40 15 - 41 U/L    Comment: HEMOLYSIS AT THIS LEVEL MAY AFFECT RESULT   ALT 32 0 - 44 U/L   Alkaline Phosphatase 144 (H) 38 - 126 U/L   Total Bilirubin 0.4 0.0 - 1.2 mg/dL   GFR, Estimated >39 >39 mL/min    Comment: (NOTE) Calculated using the CKD-EPI Creatinine Equation (2021)    Anion gap 12 5 - 15    Comment: Performed at [facility], [address] [place], [place] [address]  Urinalysis, Routine w reflex microscopic -Urine, Clean Catch     Status: Abnormal   Collection Time: 12/13/23  5:30 PM  Result Value Ref Range   Color, Urine YELLOW YELLOW   APPearance HAZY (A) CLEAR   Specific Gravity, Urine 1.023 1.005 - 1.030   pH 5.0 5.0 - 8.0   Glucose, UA NEGATIVE NEGATIVE mg/dL   Hgb urine dipstick NEGATIVE  NEGATIVE   Bilirubin Urine NEGATIVE NEGATIVE   Ketones, ur NEGATIVE NEGATIVE mg/dL   Protein, ur NEGATIVE NEGATIVE mg/dL   Nitrite NEGATIVE NEGATIVE   Leukocytes,Ua LARGE (A) NEGATIVE   RBC / HPF 0-5 0 - 5 RBC/hpf   WBC, UA >50 0 - 5 WBC/hpf   Bacteria, UA RARE (A) NONE SEEN   Squamous Epithelial / HPF 0-5 0 - 5 /HPF    Comment: Performed at [facility], [address], [place], [place] [address]  Glucose, capillary     Status: None   Collection Time: 12/14/23 12:09 AM  Result Value Ref Range   Glucose-Capillary 99 70 - 99 mg/dL    Comment: Glucose reference range applies only to samples taken after fasting for at least 8 hours.  Comprehensive metabolic panel     Status: Abnormal   Collection Time: 12/14/23  5:10 AM  Result Value Ref Range   Sodium 136 135 - 145 mmol/L   Potassium 4.6 3.5 - 5.1 mmol/L   Chloride 107 98 - 111 mmol/L   CO2 18 (L) 22 - 32 mmol/L   Glucose, Bld 115 (H) 70 - 99 mg/dL    Comment: Glucose reference range applies only to samples taken after fasting for at least 8 hours.   BUN 40 (H) 8 - 23 mg/dL   Creatinine, Ser 9.35 0.61 - 1.24 mg/dL   Calcium  8.9 8.9 - 10.3 mg/dL   Total Protein 6.5 6.5 - 8.1 g/dL   Albumin  2.8 (L) 3.5 - 5.0 g/dL   AST 47 (H) 15 - 41 U/L   ALT 44 0 - 44 U/L   Alkaline Phosphatase 139 (H) 38 - 126 U/L   Total Bilirubin 0.4 0.0 - 1.2 mg/dL   GFR, Estimated >39 >39 mL/min    Comment: (NOTE) Calculated using the CKD-EPI Creatinine Equation (2021)    Anion gap 11 5 - 15    Comment: Performed at [facility], [address], [place], [place] [address]  Magnesium      Status: None   Collection Time: 12/14/23  5:10 AM  Result Value Ref Range   Magnesium  2.0 1.7 - 2.4 mg/dL    Comment: Performed at [facility], [address] [place], [place] [address]  Phosphorus  Status: None   Collection Time: 12/14/23  5:10 AM  Result Value Ref Range   Phosphorus 3.6 2.5 - 4.6 mg/dL     Comment: Performed at [facility], [address], [place], [place] [address]  Procalcitonin     Status: None   Collection Time: 12/14/23  5:10 AM  Result Value Ref Range   Procalcitonin 0.12 ng/mL    Comment: (NOTE)   Sepsis PCT Algorithm          Lower Respiratory Tract Infection                                         PCT Algorithm -----------------------------------------------------------------  <0.5 ng/mL                    <0.10 ng/mL  Associated with low           Antibiotic therapy strongly   risk for progression          discouraged. Indicates absence   to severe sepsis              of bacteria infection  and/or septic shock             --------------------------------------------------------------  0.5-2.0 ng/mL                 0.10-0.25 ng/mL  Recommended to retest         Antibiotic therapy discouraged.  PCT within 6-24 hours         Bacterial infection unlikely  ------------------------------------------------------------  >2 ng/mL                      0.26-0.50 ng/mL  Associated with high risk     Antibiotic therapy encouraged.  for progression to severe     Bacterial infection possible  sepsis/and or septic shock    ------------------------------                                 >0.50 ng/mL                                Antibiotic therapy strongly                                 encouraged.                                Suggestive of presence of                                 bacterial infection.                                 -------------------------------------------------------------------  < or = 0.50 ng/mL OR          < or = 0.25 OR 80% decrease in PCT  80% decrease in PCT           Antibiotic therapy   Antibiotic therapy may        may  be discontinued  be discontinued                                 Performed at [facility], [address], [place], [place] [address]   CBC     Status: Abnormal   Collection  Time: 12/14/23  5:10 AM  Result Value Ref Range   WBC 7.4 4.0 - 10.5 K/uL   RBC 3.42 (L) 4.22 - 5.81 MIL/uL   Hemoglobin 10.2 (L) 13.0 - 17.0 g/dL   HCT 66.2 (L) 60.9 - 47.9 %   MCV 98.5 80.0 - 100.0 fL   MCH 29.8 26.0 - 34.0 pg   MCHC 30.3 30.0 - 36.0 g/dL   RDW 83.3 (H) 88.4 - 84.4 %   Platelets 364 150 - 400 K/uL   nRBC 0.0 0.0 - 0.2 %    Comment: Performed at [facility], [address] [place], [place] [address]  Glucose, capillary     Status: Abnormal   Collection Time: 12/14/23  6:33 AM  Result Value Ref Range   Glucose-Capillary 109 (H) 70 - 99 mg/dL    Comment: Glucose reference range applies only to samples taken after fasting for at least 8 hours.    Studies/Results: DG Abd Portable 1V-Small Bowel Obstruction Protocol-initial, 8 hr delay Result Date: 12/13/2023 CLINICAL DATA:  8 hour delayed image. EXAM: PORTABLE ABDOMEN - 1 VIEW COMPARISON:  Radiograph dated 12/11/2023. FINDINGS: Enteric tube in similar position. Persistent diffuse dilatation of the small bowel measure up to 4.5 cm. Overall interval worsening of small bowel dilatation compared to prior radiograph. No definite oral contrast noted. Degenerative changes of the spine. No acute osseous pathology. IMPRESSION: Interval worsening of small bowel dilatation compared to prior radiograph. Electronically Signed   By: Vanetta Chou M.D.   On: 12/13/2023 18:08   DG CHEST PORT 1 VIEW Result Date: 12/13/2023 CLINICAL DATA:  141880 SOB (shortness of breath) 141880 EXAM: PORTABLE CHEST - 1 VIEW COMPARISON:  11/18/2023 FINDINGS: Esophagogastric tube is well positioned terminating in the stomach. Left PICC in place terminating in the high right atrium. Low lung volumes with bronchovascular crowding. Streaky left basilar atelectasis. No focal airspace consolidation, pleural effusion, or pneumothorax. No cardiomegaly. Aortic atherosclerosis. IMPRESSION: 1. Low lung volumes with streaky left basilar atelectasis.  2. Well-positioned esophagogastric tube terminating in the stomach. Electronically Signed   By: Rogelia Myers M.D.   On: 12/13/2023 14:34    Medications: I have reviewed the patient's current medications.  Assessment: Prolonged ileus of unclear etiology Admitted from 11/02/2023 to 11/05/2023 with recurrent small bowel obstruction, laparoscopy 11/03/2023 showed possible intermittent volvulized small bowel contributing to symptoms Readmitted on 11/12/2023 was given erythromycin  on 11/18/2023 (unable to use Reglan  due to history of tardive dyskinesia), NG tube reinserted 11/18/2023  Small bowel obstruction protocol initial, 8-hour daily, 12/13/2023: Interval worsening small bowel dilation compared to prior x-ray, persistent diffuse dilation of small bowel measuring up to 4.5 cm   Lab abnormalities: Elevated BUN 40, low albumin  2.8, slightly elevated AST 47, anemia, hemoglobin 10.2  Plan: I have ordered follow-up abdominal x-ray for today morning.  If patient has persistent small bowel dilation, recommend surgical evaluation.  If small bowel dilation has improved, recommend clamping NG tube and trying clear liquids.  I will continue him on MiraLAX  17 g 3 times a day, NG tube to be clamped for 30 minutes after getting MiraLAX .  Patient  has been on IV TPN for a prolonged period of time with concerns for severe malnutrition.  Guarded prognosis.  Estelita Manas, MD 12/14/2023, 7:33 AM

## 2023-12-14 NOTE — Plan of Care (Signed)
  Problem: Education: Goal: Knowledge of General Education information will improve Description: Including pain rating scale, medication(s)/side effects and non-pharmacologic comfort measures Outcome: Progressing   Problem: Health Behavior/Discharge Planning: Goal: Ability to manage health-related needs will improve Outcome: Progressing   Problem: Clinical Measurements: Goal: Ability to maintain clinical measurements within normal limits will improve Outcome: Progressing Goal: Will remain free from infection Outcome: Progressing Goal: Diagnostic test results will improve Outcome: Progressing Goal: Respiratory complications will improve Outcome: Progressing Goal: Cardiovascular complication will be avoided Outcome: Progressing   Problem: Activity: Goal: Risk for activity intolerance will decrease Outcome: Progressing   Problem: Nutrition: Goal: Adequate nutrition will be maintained Outcome: Progressing   Problem: Coping: Goal: Level of anxiety will decrease Outcome: Progressing   Problem: Elimination: Goal: Will not experience complications related to bowel motility Outcome: Progressing Goal: Will not experience complications related to urinary retention Outcome: Progressing   Problem: Pain Managment: Goal: General experience of comfort will improve and/or be controlled Outcome: Progressing   Problem: Safety: Goal: Ability to remain free from injury will improve Outcome: Progressing   Problem: Skin Integrity: Goal: Risk for impaired skin integrity will decrease Outcome: Progressing   Problem: Nutrition Goal: Patient maintains adequate hydration Outcome: Progressing Goal: Patient maintains weight Outcome: Progressing   Problem: Education: Goal: Individualized Educational Video(s) Outcome: Progressing   Problem: Coping: Goal: Ability to adjust to condition or change in health will improve Outcome: Progressing   Problem: Fluid Volume: Goal: Ability to  maintain a balanced intake and output will improve Outcome: Progressing   Problem: Health Behavior/Discharge Planning: Goal: Ability to identify and utilize available resources and services will improve Outcome: Progressing Goal: Ability to manage health-related needs will improve Outcome: Progressing   Problem: Nutritional: Goal: Maintenance of adequate nutrition will improve Outcome: Progressing Goal: Progress toward achieving an optimal weight will improve Outcome: Progressing   Problem: Skin Integrity: Goal: Risk for impaired skin integrity will decrease Outcome: Progressing   Problem: Tissue Perfusion: Goal: Adequacy of tissue perfusion will improve Outcome: Progressing

## 2023-12-14 NOTE — Progress Notes (Signed)
 14 Days Post-Op  Subjective: CC: Known to our service.  Called back for persistent small bowel dilation noted on xray.  His wife and brother are at bedside. Most of history provided by his wife.  He denies any current abdominal pain or bloating. He did have some bloating earlier this morning. Having nausea. NGT with 1400cc/24 hours. He had 3 liquid BM's in the last 24 hours.   Objective: Vital signs in last 24 hours: Temp:  [97.3 F (36.3 C)-100.7 F (38.2 C)] 98.6 F (37 C) (09/11 0436) Pulse Rate:  [79-113] 79 (09/11 0436) Resp:  [16-30] 16 (09/11 0436) BP: (100-130)/(54-70) 108/63 (09/11 0436) SpO2:  [98 %-100 %] 98 % (09/11 0436) Last BM Date : 12/13/23  Intake/Output from previous day: 09/10 0701 - 09/11 0700 In: 3395.3 [I.V.:2213.7; IV Piggyback:1181.7] Out: 2250 [Urine:850; Emesis/NG output:1400] Intake/Output this shift: No intake/output data recorded.  PE: Gen:  Alert, NAD, pleasant Abd: Soft, ND, NT, laparoscopic incisions healing well  Lab Results:  Recent Labs    12/13/23 1537 12/14/23 0510  WBC 6.0 7.4  HGB 10.4* 10.2*  HCT 33.6* 33.7*  PLT 372 364   BMET Recent Labs    12/13/23 1537 12/14/23 0510  NA 137 136  K 4.9 4.6  CL 104 107  CO2 21* 18*  GLUCOSE 125* 115*  BUN 52* 40*  CREATININE 0.88 0.64  CALCIUM  8.8* 8.9   PT/INR No results for input(s): LABPROT, INR in the last 72 hours. CMP     Component Value Date/Time   NA 136 12/14/2023 0510   NA 141 08/02/2023 0942   K 4.6 12/14/2023 0510   CL 107 12/14/2023 0510   CO2 18 (L) 12/14/2023 0510   GLUCOSE 115 (H) 12/14/2023 0510   BUN 40 (H) 12/14/2023 0510   BUN 7 (L) 08/02/2023 0942   CREATININE 0.64 12/14/2023 0510   CREATININE 0.72 11/09/2023 1512   CALCIUM  8.9 12/14/2023 0510   PROT 6.5 12/14/2023 0510   PROT 7.0 01/09/2020 1445   ALBUMIN  2.8 (L) 12/14/2023 0510   AST 47 (H) 12/14/2023 0510   AST 36 11/09/2023 1512   ALT 44 12/14/2023 0510   ALT 32 11/09/2023 1512    ALKPHOS 139 (H) 12/14/2023 0510   BILITOT 0.4 12/14/2023 0510   BILITOT 0.5 11/09/2023 1512   GFRNONAA >60 12/14/2023 0510   GFRNONAA >60 11/09/2023 1512   Lipase  No results found for: LIPASE  Studies/Results: DG Abd 1 View Result Date: 12/14/2023 EXAM: 1 VIEW XRAY OF THE ABDOMEN 12/14/2023 07:34:00 AM COMPARISON: 12/13/2023 CLINICAL HISTORY: 01250 Ileus (HCC) [id number]. F/u ileus FINDINGS: BOWEL: Persistent distended small-bowel loops. Within the left hemiabdomen there is a small bowel loop measuring up to 5.2 cm. SOFT TISSUES: No opaque urinary calculi. BONES: No acute osseous abnormality. IMPRESSION: 1. Persistent distended small-bowel loops, with a loop in the left hemiabdomen measuring up to 5.2 cm, consistent with ileus. Electronically signed by: Waddell Calk MD 12/14/2023 08:01 AM EDT RP Workstation: [id number]   DG Abd Portable 1V-Small Bowel Obstruction Protocol-initial, 8 hr delay Result Date: 12/13/2023 CLINICAL DATA:  8 hour delayed image. EXAM: PORTABLE ABDOMEN - 1 VIEW COMPARISON:  Radiograph dated 12/11/2023. FINDINGS: Enteric tube in similar position. Persistent diffuse dilatation of the small bowel measure up to 4.5 cm. Overall interval worsening of small bowel dilatation compared to prior radiograph. No definite oral contrast noted. Degenerative changes of the spine. No acute osseous pathology. IMPRESSION: Interval worsening of small bowel dilatation compared to prior  radiograph. Electronically Signed   By: Vanetta Chou M.D.   On: 12/13/2023 18:08   DG CHEST PORT 1 VIEW Result Date: 12/13/2023 CLINICAL DATA:  141880 SOB (shortness of breath) 141880 EXAM: PORTABLE CHEST - 1 VIEW COMPARISON:  11/18/2023 FINDINGS: Esophagogastric tube is well positioned terminating in the stomach. Left PICC in place terminating in the high right atrium. Low lung volumes with bronchovascular crowding. Streaky left basilar atelectasis. No focal airspace consolidation, pleural effusion, or  pneumothorax. No cardiomegaly. Aortic atherosclerosis. IMPRESSION: 1. Low lung volumes with streaky left basilar atelectasis. 2. Well-positioned esophagogastric tube terminating in the stomach. Electronically Signed   By: Rogelia Myers M.D.   On: 12/13/2023 14:34    Anti-infectives: Anti-infectives (From admission, onward)    Start     Dose/Rate Route Frequency Ordered Stop   12/14/23 1400  fluconazole  (DIFLUCAN ) IVPB 400 mg        400 mg 100 mL/hr over 120 Minutes Intravenous Every 24 hours 12/13/23 1536     12/13/23 2200  vancomycin  (VANCOCIN ) IVPB 1000 mg/200 mL premix        1,000 mg 200 mL/hr over 60 Minutes Intravenous 2 times daily 12/13/23 1536     12/13/23 1630  fluconazole  (DIFLUCAN ) IVPB 800 mg  Status:  Discontinued        800 mg 200 mL/hr over 120 Minutes Intravenous  Once 12/13/23 1536 12/13/23 1558   12/13/23 1630  ceFEPIme  (MAXIPIME ) 2 g in sodium chloride  0.9 % 100 mL IVPB        2 g 200 mL/hr over 30 Minutes Intravenous Every 8 hours 12/13/23 1536     12/13/23 1630  vancomycin  (VANCOCIN ) IVPB 1000 mg/200 mL premix        1,000 mg 200 mL/hr over 60 Minutes Intravenous  Once 12/13/23 1536 12/13/23 1707   12/13/23 1630  fluconazole  (DIFLUCAN ) IVPB 400 mg        400 mg 100 mL/hr over 120 Minutes Intravenous Every 1 hr x 2 12/13/23 1558 12/13/23 1909   11/27/23 1200  erythromycin  500 mg in sodium chloride  0.9 % 100 mL IVPB        500 mg 100 mL/hr over 60 Minutes Intravenous Every 6 hours 11/27/23 0933 11/29/23 0723   11/24/23 1400  erythromycin  500 mg in sodium chloride  0.9 % 100 mL IVPB  Status:  Discontinued        500 mg 100 mL/hr over 60 Minutes Intravenous Every 8 hours 11/24/23 1027 11/27/23 0933   11/23/23 2000  erythromycin  250 mg in sodium chloride  0.9 % 100 mL IVPB  Status:  Discontinued        250 mg 100 mL/hr over 60 Minutes Intravenous Every 8 hours 11/23/23 1744 11/24/23 1027   11/23/23 1200  erythromycin  (E-MYCIN ) tablet 250 mg  Status:  Discontinued         250 mg Oral 3 times daily before meals 11/23/23 1004 11/23/23 1744   11/18/23 1200  erythromycin  (E-MYCIN ) tablet 250 mg  Status:  Discontinued        250 mg Oral 2 times daily with meals 11/18/23 0720 11/19/23 0734   11/13/23 1000  acyclovir  (ZOVIRAX ) tablet 400 mg  Status:  Discontinued        400 mg Oral 2 times daily 11/13/23 0503 11/19/23 0739        Assessment/Plan S/p diagnostic laparoscopy by Dr. [name] on 11/03/23  - Intra-op: Small bowel appeared intermittently dilated and injected consistent with inflammation, small amount of clear ascites,  mesenteric edema and injection.  No obstruction present however he does have a very long, mobile small bowel mesentery concerning for intermittent volvulus  - Small bowel follow through on 8/22 with no passage of contrast from stomach to small bowel was noted within the first 3 hours of the exam despite positioning patient to encourage gastric emptying. Patient was returned to the floor and KUB images at 4 hrs, 8 hrs, and 24 hrs after contrast administration were done. Delay films with contrast in the rectum with gaseous distention of small bowel with gas-filled colon. Bx results reviewed.  - He has had trial of erythromycin  and reported as intolerant to Reglan  (hx of Tardive Dyskinesia) - Small bowel endoscopy 8/28 by GI w/ small HH, mild inflammation characterized by erosions from NG tube trauma was found in the gastric body, no evidence of significant pathology in the entire examined duodenum and no evidence of significant pathology at 60 cm ( distal to the pylorus). Biopsy without clear etiology to explain patients symptoms.  - CT 9/2 w/ Small-bowel obstruction with transition point in the mid abdomen. Large amount of stool in the rectum with rectal wall thickening and presacral edema. 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 - IR consulted 9/3 for possible venting G-tube placement and IR felt pt's anatomy is not favorable for  percutaneous G tube placement  - GI has patient on bowel regimen on TID miralax  with PRN lactulose  enema. He had Linzess  earlier in the admission. GI was looking into possible Motegrity.  - Patient remains with NGT with output > 1L and xray today with persistent distended small-bowel loops  - No indication for emergency surgery - Will get repeat CT A/P with IV contrast and PO contrast via NGT to further evaluate.  - Keep K >=4, Phos >= 3, Mg >= 2 and mobilize for bowel function.  - We will follow with you  FEN - NPO, NGT to LIWS, TPN, IVF per TRH VTE - SCDs, therapeutic Lovenox  ID - Cefepime /Vanc/Fluconazole  per primary   Hx of Multiple Myeloma HTN HLD PAF on Eliquis  at baseline     LOS: 31 days    David Mcgrath Shaper, [facility] 12/14/2023, 11:33 AM Please see Amion for pager number during day hours 7:00am-4:30pm

## 2023-12-14 NOTE — Progress Notes (Signed)
 No acute changes this shift, CT performed, tolerated TPN and ATBs, had one loose stooll this morning, denies pain, in bed resting, call light in reach

## 2023-12-14 NOTE — Progress Notes (Signed)
 PROGRESS NOTE    David Mcgrath  FMW:[id number] DOB: Mar 08, 1951 DOA: 11/12/2023 PCP: [name], DO    Brief Narrative:   David Mcgrath is a 73 y.o. male with past medical history significant for HTN, HLD, paroxysmal atrial fibrillation on Eliquis , anxiety/depression, multiple myeloma (diagnosed March 2025) and received Bortezomib  infusion 11/09/2023, polyneuropathy who presented to [facility] ED on 11/12/2023 with worsening abdominal pain/distention.  Evaluation in the ED with imaging noted to have persistent ileus.  General surgery and GI consulted, underwent upper GI series and small bowel follow-through with confirm ration of delayed gastric emptying; although notable contrast in the rectum confirms not complete obstruction.  Additionally underwent endoscopy and exploratory laparoscopy with no clear findings of radiology for his symptoms.  Remains on low intermittent suction via NG tube with TPN for nutrition.  Initial plan for possible IR placement of GJ tube, unable to be completed due to poor windows.  Assessment & Plan:   Sepsis of unclear etiology Patient with elevated temperature of 100.7 F, tachycardic, tachypneic on 12/13/2023.  Complicated by TPN use with PICC line in place.  Repeat labs with WBC count 6.0, lactic acid 0.8.  Procalcitonin 0.12. -- Urine culture: Pending -- Blood cultures x 2: Pending -- Continue empiric antibiotics with vancomycin , cefepime , fluconazole ; plan 5-day course while awaiting cultures -- Supportive care, monitor fever curve, repeat labs in the a.m.  Ileus, prolonged Partial small bowel obstruction Severe constipation Patient returning to the ED with worsening abdominal pain, distention.  Imaging notable for continued ileus, small bowel obstruction unlikely given contrast continues to the colon with large stool burden noted.  Patient underwent EGD, exploratory laparoscopy without any clear findings of supine imaging or symptoms.  IR unable  to place GJ tube due to poor windows.  Patient did not tolerate erythromycin  trial.  Avoid neostigmine per GI, also patient did not tolerate Reglan  due to side effect (tardive dyskinesia).  Had acute onset abdominal pain on 9/6 with large bowel movement after Linzess  administration. -- GI following, appreciate assistance -- Small bowel follow-through study shows worsening small bowel dilation despite NGT to L WIS x 1 week -- Continue NG tube to LWIS; 1400 mL output past 24 hours; continue to monitor -- Continue TPN -- Compazine /Zofran  as needed nausea/vomiting -- Lactulose  enema twice daily as needed severe constipation -- General Surgery re-consulted today given worsening small bowel dilation  Severe protein calorie malnutrition Body mass index is 19.67 kg/m.  Nutrition Status: Nutrition Problem: Severe Malnutrition Etiology: acute illness (recurrent small bowel obstruction) Signs/Symptoms: severe fat depletion, severe muscle depletion, energy intake < or equal to 50% for > or equal to 5 days, percent weight loss (22% in 1 month) Percent weight loss: 22 % (in 1 month) Interventions: Refer to RD note for recommendations, TPN -- Continue TPN  Normocytic anemia Anemia panel with iron 17, TIBC 185, ferritin 196, folate 8.2, vitamin B12 984.  Patient transfused 2 unit PRBCs on 12/05/2023. -- Hgb 12.6>>>>7.1>9.4>10.4 -- CBC in the am  Hypokalemia Hypomagnesemia Repleted.  Currently on TPN  Multiple myeloma Follows with medical oncology outpatient, Dr. Federico.    HTN Paroxysmal atrial fibrillation Not on rate controlling/antihypertensives at baseline.  On Eliquis  at baseline. -- Continue Lovenox  80 mg subcutaneously every 12 hours   HLD Holding home Zetia , pravastatin  due to requirement of NG tube to suction   Anxiety/depression Holding home Prozac  due to requirement of NG tube to suction   Peripheral neuropathy Holding home gabapentin    GERD: --  Protonix  40 mg IV every 12h    Moderate protein calorie malnutrition  DVT prophylaxis: Place and maintain sequential compression device Start: 11/16/23 1142    Code Status: Full Code Family Communication: Spouse present at bedside this morning  Disposition Plan:  Level of care: Telemetry Status is: Inpatient Remains inpatient appropriate because: On TPN    Consultants:  [facility] General surgery IR  Procedures:  Small bowel enteroscopy, Dr. Rosalie 8/28  Antimicrobials:  Vancomycin  9/10>> Cefepime  9/10>> Fluconazole  9/10>>   Subjective: Patient seen examined bedside, lying in bed.  Spouse present at bedside.  Small bowel follow-through study yesterday shows worsening dilation of small bowel.  Discussed with GI, Dr. Herschell this morning.  Reconsulting general surgery today.  Overall prolonged hospitalization with no to little improvement of his symptoms.  Overall prognosis appears to be poor as patient is becoming more frail and weak.  Patient and spouse; concerned about his prolonged hospitalization, weight loss and progressive weakness with little improvement with current treatment plan. Patient denies headache, no chest pain, no shortness of breath, no abdominal pain, no current nausea/vomiting/diarrhea.  No acute events overnight per nursing.  Objective: Vitals:   12/13/23 1719 12/13/23 2155 12/14/23 0330 12/14/23 0436  BP: (!) 114/54 117/62 100/60 108/63  Pulse: 96 91 79 79  Resp:    16  Temp: 98.5 F (36.9 C) 98.6 F (37 C) 98.8 F (37.1 C) 98.6 F (37 C)  TempSrc: Oral Axillary Axillary Oral  SpO2: 100% 100% 99% 98%  Weight:      Height:        Intake/Output Summary (Last 24 hours) at 12/14/2023 1136 Last data filed at 12/14/2023 0500 Gross per 24 hour  Intake 3395.34 ml  Output 2250 ml  Net 1145.34 ml   Filed Weights   12/08/23 0426 12/11/23 0500 12/12/23 0500  Weight: 79.8 kg 76.4 kg 73.3 kg    Examination:  Physical Exam: GEN: NAD, alert and oriented x 3, wd/wn HEENT: NCAT, PERRL,  EOMI, sclera clear, MMM PULM: CTAB w/o wheezes/crackles, normal respiratory effort CV: RRR w/o M/G/R GI: abd soft, NTND, NABS, no R/G/M MSK: no peripheral edema, muscle strength globally intact 5/5 bilateral upper/lower extremities NEURO: CN II-XII intact, no focal deficits, sensation to light touch intact PSYCH: normal mood/affect Integumentary: dry/intact, no rashes or wounds    Data Reviewed: I have personally reviewed following labs and imaging studies  CBC: Recent Labs  Lab 12/09/23 0617 12/10/23 0628 12/11/23 0553 12/13/23 1537 12/14/23 0510  WBC 5.8 5.1 5.3 6.0 7.4  HGB 9.7* 9.5* 9.4* 10.4* 10.2*  HCT 31.3* 29.4* 30.3* 33.6* 33.7*  MCV 98.4 98.7 98.7 98.0 98.5  PLT 334 321 345 372 364   Basic Metabolic Panel: Recent Labs  Lab 12/08/23 0423 12/09/23 0617 12/10/23 0628 12/11/23 0552 12/13/23 1537 12/14/23 0510  NA 135 138 140 139 137 136  K 4.1 4.3 4.4 4.4 4.9 4.6  CL 102 103 105 104 104 107  CO2 24 24 25 26  21* 18*  GLUCOSE 94 98 110* 103* 125* 115*  BUN 24* 25* 32* 30* 52* 40*  CREATININE 0.47* 0.54* 0.59* 0.58* 0.88 0.64  CALCIUM  8.5* 8.7* 7.6* 8.7* 8.8* 8.9  MG 2.1 2.2 2.3 2.1  --  2.0  PHOS 3.7 4.2 4.3 3.6  --  3.6   GFR: Estimated Creatinine Clearance: 86.5 mL/min (by C-G formula based on SCr of 0.64 mg/dL). Liver Function Tests: Recent Labs  Lab 12/11/23 0552 12/13/23 1537 12/14/23 0510  AST 25  40 47*  ALT 24 32 44  ALKPHOS 135* 144* 139*  BILITOT 0.4 0.4 0.4  PROT 5.9* 6.8 6.5  ALBUMIN  2.6* 2.9* 2.8*   No results for input(s): LIPASE, AMYLASE in the last 168 hours. No results for input(s): AMMONIA in the last 168 hours. Coagulation Profile: No results for input(s): INR, PROTIME in the last 168 hours. Cardiac Enzymes: No results for input(s): CKTOTAL, CKMB, CKMBINDEX, TROPONINI in the last 168 hours. BNP (last 3 results) No results for input(s): PROBNP in the last 8760 hours. HbA1C: No results for input(s):  HGBA1C in the last 72 hours. CBG: Recent Labs  Lab 12/08/23 0756 12/08/23 1544 12/14/23 0009 12/14/23 0633 12/14/23 1129  GLUCAP 103* 87 99 109* 110*   Lipid Profile: No results for input(s): CHOL, HDL, LDLCALC, TRIG, CHOLHDL, LDLDIRECT in the last 72 hours.  Thyroid  Function Tests: No results for input(s): TSH, T4TOTAL, FREET4, T3FREE, THYROIDAB in the last 72 hours. Anemia Panel: No results for input(s): VITAMINB12, FOLATE, FERRITIN, TIBC, IRON, RETICCTPCT in the last 72 hours. Sepsis Labs: Recent Labs  Lab 12/13/23 1429 12/14/23 0510  PROCALCITON  --  0.12  LATICACIDVEN 0.8  --     Recent Results (from the past 240 hours)  Culture, blood (Routine X 2) w Reflex to ID Panel     Status: None   Collection Time: 12/05/23  5:24 AM   Specimen: BLOOD RIGHT HAND  Result Value Ref Range Status   Specimen Description   Final    BLOOD RIGHT HAND Performed at [facility], [address], [place], [place] [address]    Special Requests   Final    BOTTLES DRAWN AEROBIC AND ANAEROBIC Blood Culture results may not be optimal due to an inadequate volume of blood received in culture bottles Performed at [facility], [address] [place] [address] [place], [place] [address]    Culture   Final    NO GROWTH 5 DAYS Performed at [facility], [address], [place], [place] [address]    Report Status 12/10/2023 FINAL  Final  Culture, blood (Routine X 2) w Reflex to ID Panel     Status: None   Collection Time: 12/05/23  5:33 AM   Specimen: BLOOD  Result Value Ref Range Status   Specimen Description   Final    BLOOD SITE NOT SPECIFIED Performed at [facility], [address], [place], [place] [address]    Special Requests   Final    BOTTLES DRAWN AEROBIC ONLY Blood Culture results may not be optimal due to an inadequate volume of blood received in culture bottles Performed at [facility], [address], [place], [place] [address]    Culture   Final    NO GROWTH 5 DAYS Performed at [facility], [address], [place], [place] [address]    Report Status 12/10/2023 FINAL  Final  Culture, blood (Routine X 2) w Reflex to ID Panel     Status: None (Preliminary result)   Collection Time: 12/13/23  3:33 PM   Specimen: BLOOD RIGHT ARM  Result Value Ref Range Status   Specimen Description   Final    BLOOD RIGHT ARM Performed at [facility], [address] [place], [place] [address]    Special Requests   Final    BOTTLES DRAWN AEROBIC AND ANAEROBIC Blood Culture adequate volume Performed at [facility], [address] [place], [place]  [address]    Culture   Final    NO GROWTH < 12 HOURS Performed at [facility], [address] [place], [place] [address]    Report Status PENDING  Incomplete  Culture, blood (Routine X 2) w Reflex to ID Panel     Status: None (Preliminary result)   Collection Time: 12/13/23  3:35 PM   Specimen: BLOOD RIGHT HAND  Result Value Ref Range Status   Specimen Description   Final    BLOOD RIGHT HAND Performed at [facility], [address] [place], [place] [address]    Special Requests   Final    BOTTLES DRAWN AEROBIC AND ANAEROBIC Blood Culture adequate volume Performed at [facility], [address] [place], [place] [address]    Culture   Final    NO GROWTH < 12 HOURS Performed at [facility], [address] [place], [place] [address]    Report Status PENDING  Incomplete         Radiology Studies: DG Abd 1 View Result Date: 12/14/2023 EXAM: 1 VIEW XRAY OF THE ABDOMEN 12/14/2023 07:34:00 AM COMPARISON: 12/13/2023 CLINICAL HISTORY: [id number] Ileus (HCC) [id number]. F/u ileus FINDINGS: BOWEL: Persistent distended small-bowel loops. Within the left hemiabdomen there is a small bowel loop measuring up to 5.2 cm. SOFT TISSUES: No opaque urinary calculi. BONES: No acute  osseous abnormality. IMPRESSION: 1. Persistent distended small-bowel loops, with a loop in the left hemiabdomen measuring up to 5.2 cm, consistent with ileus. Electronically signed by: Waddell Calk MD 12/14/2023 08:01 AM EDT RP Workstation: [id number]   DG Abd Portable 1V-Small Bowel Obstruction Protocol-initial, 8 hr delay Result Date: 12/13/2023 CLINICAL DATA:  8 hour delayed image. EXAM: PORTABLE ABDOMEN - 1 VIEW COMPARISON:  Radiograph dated 12/11/2023. FINDINGS: Enteric tube in similar position. Persistent diffuse dilatation of the small bowel measure up to 4.5 cm. Overall interval worsening of small bowel dilatation compared to prior radiograph. No definite oral contrast noted. Degenerative changes of the spine. No acute osseous pathology. IMPRESSION: Interval worsening of small bowel dilatation compared to prior radiograph. Electronically Signed   By: Vanetta Chou M.D.   On: 12/13/2023 18:08   DG CHEST PORT 1 VIEW Result Date: 12/13/2023 CLINICAL DATA:  [id number] SOB (shortness of breath) [id number] EXAM: PORTABLE CHEST - 1 VIEW COMPARISON:  11/18/2023 FINDINGS: Esophagogastric tube is well positioned terminating in the stomach. Left PICC in place terminating in the high right atrium. Low lung volumes with bronchovascular crowding. Streaky left basilar atelectasis. No focal airspace consolidation, pleural effusion, or pneumothorax. No cardiomegaly. Aortic atherosclerosis. IMPRESSION: 1. Low lung volumes with streaky left basilar atelectasis. 2. Well-positioned esophagogastric tube terminating in the stomach. Electronically Signed   By: Rogelia Myers M.D.   On: 12/13/2023 14:34        Scheduled Meds:  Chlorhexidine  Gluconate Cloth  6 each Topical Q2200   enoxaparin  (LOVENOX ) injection  1 mg/kg Subcutaneous Q12H   fluticasone   1 spray Each Nare Daily   metoprolol  tartrate  5 mg Intravenous Q8H   pantoprazole  (PROTONIX ) IV  40 mg Intravenous Q12H   polyethylene glycol  17 g Oral TID   sodium  chloride flush  10-40 mL Intracatheter Q12H   sodium chloride  flush  10-40 mL Intracatheter Q12H   Continuous Infusions:  ceFEPime  (MAXIPIME ) IV 2 g (12/14/23 0543)   fluconazole  (DIFLUCAN ) IV     TPN ADULT (ION) 100 mL/hr at 12/14/23 0346   TPN ADULT (ION)  vancomycin  1,000 mg (12/14/23 1008)     LOS: 31 days    Time spent: 56 minutes spent on 12/14/2023 caring for this patient face-to-face including chart review, ordering labs/tests, documenting, discussion with nursing staff, consultants, updating family and interview/physical exam    Camellia PARAS [place], DO [facility] Available via Epic secure chat 7am-7pm After these hours, please refer to coverage provider listed on [email or web address] 12/14/2023, 11:36 AM

## 2023-12-14 NOTE — Progress Notes (Signed)
 Inpatient Rehab Admissions Coordinator:   Bowel obstruction series yesterday which showed interval worsening of small bowel dilation and persistent dilation of small bowel up to 4.5 cm.  Repeat xray this AM showed persistent distention in small bowel loops increased in size up to 5.2 cm, consistent with ileus.  Surgery consult pending.  CIR continues to follow in the distance.   Reche Lowers, PT, DPT Admissions Coordinator [phone] 12/14/23  10:57 AM

## 2023-12-14 NOTE — Progress Notes (Signed)
 PT Cancellation Note  Patient Details Name: David Mcgrath MRN: [id number] DOB: 28-Jun-1950   Cancelled Treatment:    Pt present with a medical decline.   Pt unable to tolerate any OOB activity due to multiple complexities: worsening small bowel dilation, elevated temp with tachycardia, nausea, liquid BM's, increased weakness/muscle wasting with prolonged TPN use.  Last PT session was 12/08/23.  Will consult LPT.  Spouse is very concerned stating, He's just wasting away.    Katheryn Leap  PTA Acute  Rehabilitation Services Office M-F          [phone]

## 2023-12-15 ENCOUNTER — Encounter (HOSPITAL_COMMUNITY): Admission: EM | Disposition: E | Payer: Self-pay | Source: Home / Self Care | Attending: Internal Medicine

## 2023-12-15 ENCOUNTER — Inpatient Hospital Stay (HOSPITAL_COMMUNITY): Admitting: Certified Registered Nurse Anesthetist

## 2023-12-15 ENCOUNTER — Encounter (HOSPITAL_COMMUNITY): Payer: Self-pay | Admitting: Internal Medicine

## 2023-12-15 ENCOUNTER — Other Ambulatory Visit: Payer: Self-pay

## 2023-12-15 DIAGNOSIS — G4733 Obstructive sleep apnea (adult) (pediatric): Secondary | ICD-10-CM

## 2023-12-15 DIAGNOSIS — K567 Ileus, unspecified: Secondary | ICD-10-CM | POA: Diagnosis not present

## 2023-12-15 DIAGNOSIS — K9083 Intestinal failure: Secondary | ICD-10-CM | POA: Diagnosis not present

## 2023-12-15 DIAGNOSIS — I10 Essential (primary) hypertension: Secondary | ICD-10-CM | POA: Diagnosis not present

## 2023-12-15 DIAGNOSIS — I480 Paroxysmal atrial fibrillation: Secondary | ICD-10-CM

## 2023-12-15 HISTORY — PX: LAPAROSCOPIC INSERTION GASTROSTOMY TUBE: SHX6817

## 2023-12-15 LAB — BLOOD CULTURE ID PANEL (REFLEXED) - BCID2

## 2023-12-15 LAB — URINE CULTURE: Culture: >=100000 — AB

## 2023-12-15 LAB — COMPREHENSIVE METABOLIC PANEL WITH GFR
ALT: 43 U/L (ref 0–44)
AST: 41 U/L (ref 15–41)
Albumin: 2.8 g/dL — ABNORMAL LOW (ref 3.5–5.0)
Alkaline Phosphatase: 140 U/L — ABNORMAL HIGH (ref 38–126)
Anion gap: 10 (ref 5–15)
BUN: 33 mg/dL — ABNORMAL HIGH (ref 8–23)
CO2: 20 mmol/L — ABNORMAL LOW (ref 22–32)
Calcium: 8.9 mg/dL (ref 8.9–10.3)
Chloride: 108 mmol/L (ref 98–111)
Creatinine, Ser: 0.54 mg/dL — ABNORMAL LOW (ref 0.61–1.24)
GFR, Estimated: >60 mL/min (ref >60)
Glucose, Bld: 116 mg/dL — ABNORMAL HIGH (ref 70–99)
Potassium: 4.7 mmol/L (ref 3.5–5.1)
Sodium: 137 mmol/L (ref 135–145)
Total Bilirubin: 0.4 mg/dL (ref 0.0–1.2)
Total Protein: 6.5 g/dL (ref 6.5–8.1)

## 2023-12-15 LAB — CBC
HCT: 33.1 % — ABNORMAL LOW (ref 39.0–52.0)
Hemoglobin: 10.4 g/dL — ABNORMAL LOW (ref 13.0–17.0)
MCH: 31.1 pg (ref 26.0–34.0)
MCHC: 31.4 g/dL (ref 30.0–36.0)
MCV: 99.1 fL (ref 80.0–100.0)
Platelets: 355 K/uL (ref 150–400)
RBC: 3.34 MIL/uL — ABNORMAL LOW (ref 4.22–5.81)
RDW: 16.4 % — ABNORMAL HIGH (ref 11.5–15.5)
WBC: 9.8 K/uL (ref 4.0–10.5)
nRBC: 0 % (ref 0.0–0.2)

## 2023-12-15 LAB — GLUCOSE, CAPILLARY
Glucose-Capillary: 94 mg/dL (ref 70–99)
Glucose-Capillary: 97 mg/dL (ref 70–99)
Glucose-Capillary: 97 mg/dL (ref 70–99)

## 2023-12-15 SURGERY — INSERTION, GASTROSTOMY TUBE, PERCUTANEOUS
Anesthesia: General

## 2023-12-15 MED ORDER — METHOCARBAMOL 1000 MG/10ML IJ SOLN
500.0000 mg | Freq: Three times a day (TID) | INTRAMUSCULAR | Status: DC | PRN
  Administered 2023-12-17: 500 mg via INTRAVENOUS
  Filled 2023-12-15: qty 10

## 2023-12-15 MED ORDER — OXYCODONE HCL 5 MG/5ML PO SOLN: 5.0000 mg | Freq: Once | ORAL | Status: DC | PRN

## 2023-12-15 MED ORDER — TRAVASOL 10 % IV SOLN
INTRAVENOUS | Status: AC
  Filled 2023-12-15: qty 1320

## 2023-12-15 MED ORDER — LIDOCAINE HCL (PF) 2 % IJ SOLN
INTRAMUSCULAR | Status: AC
  Filled 2023-12-15: qty 5

## 2023-12-15 MED ORDER — MIDAZOLAM HCL 2 MG/2ML IJ SOLN: 0.5000 mg | Freq: Once | INTRAMUSCULAR | Status: DC | PRN

## 2023-12-15 MED ORDER — FENTANYL CITRATE PF 50 MCG/ML IJ SOSY: 25.0000 ug | PREFILLED_SYRINGE | INTRAMUSCULAR | Status: DC | PRN

## 2023-12-15 MED ORDER — OXYCODONE HCL 5 MG/5ML PO SOLN: 5.0000 mg | ORAL | Status: DC | PRN

## 2023-12-15 MED ORDER — PROPOFOL 10 MG/ML IV BOLUS
INTRAVENOUS | Status: AC
Start: 2023-12-15 — End: 2023-12-15
  Filled 2023-12-15: qty 20

## 2023-12-15 MED ORDER — DEXAMETHASONE SODIUM PHOSPHATE 4 MG/ML IJ SOLN
INTRAMUSCULAR | Status: DC | PRN
Start: 2023-12-15 — End: 2023-12-15
  Administered 2023-12-15: 5 mg via INTRAVENOUS

## 2023-12-15 MED ORDER — 0.9 % SODIUM CHLORIDE (POUR BTL) OPTIME
TOPICAL | Status: DC | PRN
  Administered 2023-12-15: 1000 mL

## 2023-12-15 MED ORDER — OXYCODONE HCL 5 MG PO TABS: 5.0000 mg | ORAL_TABLET | Freq: Once | ORAL | Status: DC | PRN

## 2023-12-15 MED ORDER — CHLORHEXIDINE GLUCONATE 0.12 % MT SOLN
15.0000 mL | Freq: Once | OROMUCOSAL | Status: AC
  Administered 2023-12-15: 15 mL via OROMUCOSAL

## 2023-12-15 MED ORDER — LIDOCAINE HCL (CARDIAC) PF 100 MG/5ML IV SOSY
PREFILLED_SYRINGE | INTRAVENOUS | Status: DC | PRN
  Administered 2023-12-15: 50 mg via INTRAVENOUS

## 2023-12-15 MED ORDER — BUPIVACAINE-EPINEPHRINE 0.25% -1:200000 IJ SOLN
INTRAMUSCULAR | Status: DC | PRN
  Administered 2023-12-15: 30 mL

## 2023-12-15 MED ORDER — LACTATED RINGERS IV SOLN: INTRAVENOUS | Status: DC

## 2023-12-15 MED ORDER — ALBUMIN HUMAN 5 % IV SOLN
INTRAVENOUS | Status: DC | PRN
Start: 2023-12-15 — End: 2023-12-15

## 2023-12-15 MED ORDER — FENTANYL CITRATE (PF) 100 MCG/2ML IJ SOLN
INTRAMUSCULAR | Status: DC | PRN
  Administered 2023-12-15 (×4): 25 ug via INTRAVENOUS

## 2023-12-15 MED ORDER — PHENYLEPHRINE HCL (PRESSORS) 10 MG/ML IV SOLN
INTRAVENOUS | Status: DC | PRN
Start: 2023-12-15 — End: 2023-12-15
  Administered 2023-12-15: 160 ug via INTRAVENOUS
  Administered 2023-12-15 (×4): 80 ug via INTRAVENOUS
  Administered 2023-12-15 (×2): 160 ug via INTRAVENOUS
  Administered 2023-12-15: 80 ug via INTRAVENOUS
  Administered 2023-12-15: 160 ug via INTRAVENOUS
  Administered 2023-12-15: 80 ug via INTRAVENOUS

## 2023-12-15 MED ORDER — ONDANSETRON HCL 4 MG/2ML IJ SOLN
INTRAMUSCULAR | Status: DC | PRN
  Administered 2023-12-15: 4 mg via INTRAVENOUS

## 2023-12-15 MED ORDER — ACETAMINOPHEN 500 MG PO TABS
1000.0000 mg | ORAL_TABLET | Freq: Three times a day (TID) | ORAL | Status: DC
Start: 2023-12-15 — End: 2023-12-19
  Administered 2023-12-15 – 2023-12-18 (×10): 1000 mg via ORAL
  Filled 2023-12-15 (×10): qty 2

## 2023-12-15 MED ORDER — FENTANYL CITRATE (PF) 100 MCG/2ML IJ SOLN
INTRAMUSCULAR | Status: AC
  Filled 2023-12-15: qty 2

## 2023-12-15 MED ORDER — ROCURONIUM BROMIDE 100 MG/10ML IV SOLN
INTRAVENOUS | Status: DC | PRN
  Administered 2023-12-15: 10 mg via INTRAVENOUS
  Administered 2023-12-15: 30 mg via INTRAVENOUS

## 2023-12-15 MED ORDER — PROPOFOL 10 MG/ML IV BOLUS
INTRAVENOUS | Status: DC | PRN
  Administered 2023-12-15: 150 mg via INTRAVENOUS

## 2023-12-15 MED ORDER — SUGAMMADEX SODIUM 200 MG/2ML IV SOLN
INTRAVENOUS | Status: DC | PRN
  Administered 2023-12-15: 200 mg via INTRAVENOUS

## 2023-12-15 MED ORDER — SUCCINYLCHOLINE CHLORIDE 200 MG/10ML IV SOSY
PREFILLED_SYRINGE | INTRAVENOUS | Status: DC | PRN
Start: 2023-12-15 — End: 2023-12-15
  Administered 2023-12-15: 120 mg via INTRAVENOUS

## 2023-12-15 MED ORDER — STERILE WATER FOR IRRIGATION IR SOLN
Status: DC | PRN
  Administered 2023-12-15: 1000 mL

## 2023-12-15 MED ORDER — ROCURONIUM BROMIDE 10 MG/ML (PF) SYRINGE
PREFILLED_SYRINGE | INTRAVENOUS | Status: AC
  Filled 2023-12-15: qty 10

## 2023-12-15 MED ORDER — PHENYLEPHRINE 80 MCG/ML (10ML) SYRINGE FOR IV PUSH (FOR BLOOD PRESSURE SUPPORT)
PREFILLED_SYRINGE | INTRAVENOUS | Status: AC
  Filled 2023-12-15: qty 20

## 2023-12-15 MED ORDER — SUGAMMADEX SODIUM 200 MG/2ML IV SOLN
INTRAVENOUS | Status: AC
  Filled 2023-12-15: qty 2

## 2023-12-15 MED ORDER — LACTATED RINGERS IV SOLN: INTRAVENOUS | Status: DC | PRN

## 2023-12-15 MED ORDER — DEXAMETHASONE SODIUM PHOSPHATE 10 MG/ML IJ SOLN
INTRAMUSCULAR | Status: AC
  Filled 2023-12-15: qty 1

## 2023-12-15 MED ORDER — ONDANSETRON HCL 4 MG/2ML IJ SOLN
INTRAMUSCULAR | Status: AC
  Filled 2023-12-15: qty 2

## 2023-12-15 MED ORDER — MEPERIDINE HCL 100 MG/ML IJ SOLN: 6.2500 mg | INTRAMUSCULAR | Status: DC | PRN

## 2023-12-15 SURGICAL SUPPLY — 36 items
BAG COUNTER SPONGE SURGICOUNT (BAG) IMPLANT
BAG DRAINAGE 1000ML ENFIT (BAG) IMPLANT
BENZOIN TINCTURE PRP APPL 2/3 (GAUZE/BANDAGES/DRESSINGS) IMPLANT
BNDG ADH 1X3 SHEER STRL LF (GAUZE/BANDAGES/DRESSINGS) ×2 IMPLANT
CABLE HIGH FREQUENCY MONO STRZ (ELECTRODE) IMPLANT
CHLORAPREP W/TINT 26 (MISCELLANEOUS) ×1 IMPLANT
COVER SURGICAL LIGHT HANDLE (MISCELLANEOUS) ×1 IMPLANT
DERMABOND ADVANCED .7 DNX12 (GAUZE/BANDAGES/DRESSINGS) IMPLANT
ELECT PENCIL ROCKER SW 15FT (MISCELLANEOUS) ×1 IMPLANT
G-TUBE MIC 18FR ENFIT ADLT (TUBING) IMPLANT
G-TUBE MIC BOLUS 22FR ENFIT (TUBING) IMPLANT
G-TUBE MIC BOLUS 24FR ENFIT (CATHETERS) IMPLANT
GLOVE BIOGEL PI IND STRL 7.0 (GLOVE) ×1 IMPLANT
GLOVE SURG SS PI 7.0 STRL IVOR (GLOVE) ×1 IMPLANT
GOWN STRL REUS W/ TWL LRG LVL3 (GOWN DISPOSABLE) ×1 IMPLANT
GRASPER SUT TROCAR 14GX15 (MISCELLANEOUS) ×1 IMPLANT
IRRIGATION SUCT STRKRFLW 2 WTP (MISCELLANEOUS) IMPLANT
J-TUBE MIC 16FX51 UNV ENFIT (TUBING) IMPLANT
KIT BASIN OR (CUSTOM PROCEDURE TRAY) ×1 IMPLANT
KIT PG 24FR PUL EVV ENFIT (KITS) IMPLANT
KIT TURNOVER KIT A (KITS) ×1 IMPLANT
LUBRICANT JELLY K Y 4OZ (MISCELLANEOUS) IMPLANT
SCISSORS LAP 5X35 DISP (ENDOMECHANICALS) IMPLANT
SET IRRIG Y TYPE TUR BLADDER L (SET/KITS/TRAYS/PACK) IMPLANT
SET TUBE SMOKE EVAC HIGH FLOW (TUBING) ×1 IMPLANT
SLEEVE Z-THREAD 5X100MM (TROCAR) ×1 IMPLANT
SPIKE FLUID TRANSFER (MISCELLANEOUS) ×1 IMPLANT
STRIP CLOSURE SKIN 1/2X4 (GAUZE/BANDAGES/DRESSINGS) IMPLANT
SUT ETHILON 2 0 PS N (SUTURE) IMPLANT
SUT MNCRL AB 4-0 PS2 18 (SUTURE) ×1 IMPLANT
SUT SILK 2 0 SH (SUTURE) ×3 IMPLANT
SUT SILK 3 0 SH 30 (SUTURE) ×5 IMPLANT
TOWEL OR 17X26 10 PK STRL BLUE (TOWEL DISPOSABLE) ×1 IMPLANT
TRAY LAPAROSCOPIC (CUSTOM PROCEDURE TRAY) ×1 IMPLANT
TROCAR KII 8X100ML NONTHREADED (TROCAR) ×1 IMPLANT
TROCAR Z-THREAD OPTICAL 5X100M (TROCAR) ×1 IMPLANT

## 2023-12-15 NOTE — Plan of Care (Signed)
  Problem: Education: Goal: Knowledge of General Education information will improve Description: Including pain rating scale, medication(s)/side effects and non-pharmacologic comfort measures Outcome: Progressing   Problem: Health Behavior/Discharge Planning: Goal: Ability to manage health-related needs will improve Outcome: Progressing   Problem: Clinical Measurements: Goal: Ability to maintain clinical measurements within normal limits will improve Outcome: Progressing Goal: Will remain free from infection Outcome: Progressing Goal: Diagnostic test results will improve Outcome: Progressing Goal: Respiratory complications will improve Outcome: Progressing Goal: Cardiovascular complication will be avoided Outcome: Progressing   Problem: Activity: Goal: Risk for activity intolerance will decrease Outcome: Progressing   Problem: Nutrition: Goal: Adequate nutrition will be maintained Outcome: Progressing   Problem: Coping: Goal: Level of anxiety will decrease Outcome: Progressing   Problem: Elimination: Goal: Will not experience complications related to bowel motility Outcome: Progressing Goal: Will not experience complications related to urinary retention Outcome: Progressing   Problem: Pain Managment: Goal: General experience of comfort will improve and/or be controlled Outcome: Progressing   Problem: Safety: Goal: Ability to remain free from injury will improve Outcome: Progressing   Problem: Skin Integrity: Goal: Risk for impaired skin integrity will decrease Outcome: Progressing   Problem: Nutrition Goal: Patient maintains adequate hydration Outcome: Progressing Goal: Patient maintains weight Outcome: Progressing   Problem: Education: Goal: Individualized Educational Video(s) Outcome: Progressing   Problem: Coping: Goal: Ability to adjust to condition or change in health will improve Outcome: Progressing   Problem: Fluid Volume: Goal: Ability to  maintain a balanced intake and output will improve Outcome: Progressing   Problem: Health Behavior/Discharge Planning: Goal: Ability to identify and utilize available resources and services will improve Outcome: Progressing Goal: Ability to manage health-related needs will improve Outcome: Progressing   Problem: Nutritional: Goal: Maintenance of adequate nutrition will improve Outcome: Progressing Goal: Progress toward achieving an optimal weight will improve Outcome: Progressing   Problem: Skin Integrity: Goal: Risk for impaired skin integrity will decrease Outcome: Progressing   Problem: Tissue Perfusion: Goal: Adequacy of tissue perfusion will improve Outcome: Progressing

## 2023-12-15 NOTE — Anesthesia Preprocedure Evaluation (Addendum)
 Anesthesia Evaluation  Patient identified by MRN, date of birth, ID band Patient awake    Reviewed: Allergy & Precautions, NPO status , Patient's Chart, lab work & pertinent test results, reviewed documented beta blocker date and time   History of Anesthesia Complications Negative for: history of anesthetic complications  Airway Mallampati: II  TM Distance: >3 FB Neck ROM: Full    Dental  (+) Dental Advisory Given   Pulmonary sleep apnea and Continuous Positive Airway Pressure Ventilation    breath sounds clear to auscultation       Cardiovascular hypertension, Pt. on medications and Pt. on home beta blockers (-) angina + dysrhythmias Atrial Fibrillation and Supra Ventricular Tachycardia  Rhythm:Regular Rate:Normal  07/2023 ECHO: EF 60 to 65%.  1.The LV has normal function, no regional wall motion abnormalities. Left ventricular diastolic parameters were normal.   2. RVF is normal. The right ventricular size is normal.   3. The mitral valve is normal in structure. Mild mitral valve regurgitation. No evidence of mitral stenosis.   4. The aortic valve is calcified. There is moderate calcification of the aortic valve. There is moderate thickening of the aortic valve. Aortic valve regurgitation is not visualized. No aortic stenosis is present.     Neuro/Psych    Depression    negative neurological ROS     GI/Hepatic ,GERD  ,,Ileus: NG tube in place   Endo/Other  negative endocrine ROS    Renal/GU Renal InsufficiencyRenal disease     Musculoskeletal   Abdominal   Peds  Hematology Hb 10.4, plt 355k Eliquis  Multiple myeloma   Anesthesia Other Findings   Reproductive/Obstetrics                              Anesthesia Physical Anesthesia Plan  ASA: 3  Anesthesia Plan: General   Post-op Pain Management: Ofirmev  IV (intra-op)*   Induction: Intravenous  PONV Risk Score and Plan: 2 and  Ondansetron  and Dexamethasone   Airway Management Planned: Oral ETT  Additional Equipment: None  Intra-op Plan:   Post-operative Plan: Extubation in OR  Informed Consent: I have reviewed the patients History and Physical, chart, labs and discussed the procedure including the risks, benefits and alternatives for the proposed anesthesia with the patient or authorized representative who has indicated his/her understanding and acceptance.     Dental advisory given  Plan Discussed with: CRNA and Surgeon  Anesthesia Plan Comments:          Anesthesia Quick Evaluation

## 2023-12-15 NOTE — Progress Notes (Signed)
 * Day of Surgery *  Subjective: CC: Known to our service.  Called back for persistent small bowel dilation noted on xray.  NGT with ongoing high bilious output. Abdomen overall non-tender.   Objective: Vital signs in last 24 hours: Temp:  [97.8 F (36.6 C)-99.7 F (37.6 C)] 99.7 F (37.6 C) (09/12 1140) Pulse Rate:  [94-100] 95 (09/12 1140) Resp:  [18-31] 20 (09/12 1140) BP: (100-136)/(58-95) 123/95 (09/12 1140) SpO2:  [92 %-99 %] 92 % (09/12 1140) Last BM Date : 12/13/23  Intake/Output from previous day: 09/11 0701 - 09/12 0700 In: 1552.6 [I.V.:1320.9; IV Piggyback:231.7] Out: 2200 [Urine:1500; Emesis/NG output:700] Intake/Output this shift: No intake/output data recorded.  PE: Gen:  Alert, NAD, pleasant Abd: Soft, ND, NT, laparoscopic incisions healing well  NG with bilious effluent Ext: RUE with what appears to be thrombophlebitis at an old I've site - mild erythema and palpable rope-like mass above antecubital fossa   Lab Results:  Recent Labs    12/14/23 0510 12/15/23 0500  WBC 7.4 9.8  HGB 10.2* 10.4*  HCT 33.7* 33.1*  PLT 364 355   BMET Recent Labs    12/14/23 0510 12/15/23 0500  NA 136 137  K 4.6 4.7  CL 107 108  CO2 18* 20*  GLUCOSE 115* 116*  BUN 40* 33*  CREATININE 0.64 0.54*  CALCIUM  8.9 8.9   PT/INR No results for input(s): LABPROT, INR in the last 72 hours. CMP     Component Value Date/Time   NA 137 12/15/2023 0500   NA 141 08/02/2023 0942   K 4.7 12/15/2023 0500   CL 108 12/15/2023 0500   CO2 20 (L) 12/15/2023 0500   GLUCOSE 116 (H) 12/15/2023 0500   BUN 33 (H) 12/15/2023 0500   BUN 7 (L) 08/02/2023 0942   CREATININE 0.54 (L) 12/15/2023 0500   CREATININE 0.72 11/09/2023 1512   CALCIUM  8.9 12/15/2023 0500   PROT 6.5 12/15/2023 0500   PROT 7.0 01/09/2020 1445   ALBUMIN  2.8 (L) 12/15/2023 0500   AST 41 12/15/2023 0500   AST 36 11/09/2023 1512   ALT 43 12/15/2023 0500   ALT 32 11/09/2023 1512   ALKPHOS 140 (H)  12/15/2023 0500   BILITOT 0.4 12/15/2023 0500   BILITOT 0.5 11/09/2023 1512   GFRNONAA >60 12/15/2023 0500   GFRNONAA >60 11/09/2023 1512   Lipase  No results found for: LIPASE  Studies/Results: CT ABDOMEN PELVIS W CONTRAST Result Date: 12/14/2023 CLINICAL DATA:  Abdominal pain, postop.  Prolonged postop ileus. EXAM: CT ABDOMEN AND PELVIS WITH CONTRAST TECHNIQUE: Multidetector CT imaging of the abdomen and pelvis was performed using the standard protocol following bolus administration of intravenous contrast. RADIATION DOSE REDUCTION: This exam was performed according to the departmental dose-optimization program which includes automated exposure control, adjustment of the mA and/or kV according to patient size and/or use of iterative reconstruction technique. CONTRAST:  OMNIPAQUE  IOHEXOL  300 MG/ML  SOLN COMPARISON:  12/05/2023 FINDINGS: Lower chest: Ground-glass opacities in both lower lobes are similar to prior study. No effusions. Coronary artery calcifications. Hepatobiliary: No focal hepatic abnormality. Gallbladder unremarkable. Pancreas: No focal abnormality or ductal dilatation. Spleen: No focal abnormality.  Normal size. Adrenals/Urinary Tract: No adrenal abnormality. No focal renal abnormality. No stones or hydronephrosis. Urinary bladder is unremarkable. Stomach/Bowel: NG tube in the stomach which is decompressed. There are several mildly dilated fluid-filled small bowel loops into the pelvis. Distal small bowel loops are decompressed. Large bowel is decompressed. A few of the right lower  quadrant small bowel loops appear mildly thick walled. Vascular/Lymphatic: Aortoiliac atherosclerosis. No evidence of aneurysm or adenopathy. Reproductive: No visible focal abnormality. Other: No free fluid or free air. Musculoskeletal: No acute bony abnormality. IMPRESSION: Mildly dilated fluid-filled small bowel loops into the pelvis. Several distal small bowel loops appear mildly thick walled. This  could reflect infectious or inflammatory enteritis with small bowel ileus or some degree of resulting functional obstruction. Large bowel is decompressed. NG tube coils in the stomach. Continued ground-glass opacities in the lower lobes, similar to prior study. Aortic atherosclerosis, coronary artery disease. Electronically Signed   By: Franky Crease M.D.   On: 12/14/2023 17:40   DG Abd 1 View Result Date: 12/14/2023 EXAM: 1 VIEW XRAY OF THE ABDOMEN 12/14/2023 07:34:00 AM COMPARISON: 12/13/2023 CLINICAL HISTORY: 01250 Ileus ([facility]CC) [id number]. F/u ileus FINDINGS: BOWEL: Persistent distended small-bowel loops. Within the left hemiabdomen there is a small bowel loop measuring up to 5.2 cm. SOFT TISSUES: No opaque urinary calculi. BONES: No acute osseous abnormality. IMPRESSION: 1. Persistent distended small-bowel loops, with a loop in the left hemiabdomen measuring up to 5.2 cm, consistent with ileus. Electronically signed by: Waddell Calk MD 12/14/2023 08:01 AM EDT RP Workstation: [id number]   DG Abd Portable 1V-Small Bowel Obstruction Protocol-initial, 8 hr delay Result Date: 12/13/2023 CLINICAL DATA:  8 hour delayed image. EXAM: PORTABLE ABDOMEN - 1 VIEW COMPARISON:  Radiograph dated 12/11/2023. FINDINGS: Enteric tube in similar position. Persistent diffuse dilatation of the small bowel measure up to 4.5 cm. Overall interval worsening of small bowel dilatation compared to prior radiograph. No definite oral contrast noted. Degenerative changes of the spine. No acute osseous pathology. IMPRESSION: Interval worsening of small bowel dilatation compared to prior radiograph. Electronically Signed   By: Vanetta Chou M.D.   On: 12/13/2023 18:08    Anti-infectives: Anti-infectives (From admission, onward)    Start     Dose/Rate Route Frequency Ordered Stop   12/14/23 1400  [MAR Hold]  fluconazole  (DIFLUCAN ) IVPB 400 mg        (MAR Hold since Fri 12/15/2023 at 1136.Hold Reason: Transfer to a Procedural area)   400  mg 100 mL/hr over 120 Minutes Intravenous Every 24 hours 12/13/23 1536     12/13/23 2200  [MAR Hold]  vancomycin  (VANCOCIN ) IVPB 1000 mg/200 mL premix        (MAR Hold since Fri 12/15/2023 at 1136.Hold Reason: Transfer to a Procedural area)   1,000 mg 200 mL/hr over 60 Minutes Intravenous 2 times daily 12/13/23 1536     12/13/23 1630  fluconazole  (DIFLUCAN ) IVPB 800 mg  Status:  Discontinued        800 mg 200 mL/hr over 120 Minutes Intravenous  Once 12/13/23 1536 12/13/23 1558   12/13/23 1630  [MAR Hold]  ceFEPIme  (MAXIPIME ) 2 g in sodium chloride  0.9 % 100 mL IVPB        (MAR Hold since Fri 12/15/2023 at 1136.Hold Reason: Transfer to a Procedural area)   2 g 200 mL/hr over 30 Minutes Intravenous Every 8 hours 12/13/23 1536     12/13/23 1630  vancomycin  (VANCOCIN ) IVPB 1000 mg/200 mL premix        1,000 mg 200 mL/hr over 60 Minutes Intravenous  Once 12/13/23 1536 12/13/23 1707   12/13/23 1630  fluconazole  (DIFLUCAN ) IVPB 400 mg        400 mg 100 mL/hr over 120 Minutes Intravenous Every 1 hr x 2 12/13/23 1558 12/13/23 1909   11/27/23 1200  erythromycin  500 mg in  sodium chloride  0.9 % 100 mL IVPB        500 mg 100 mL/hr over 60 Minutes Intravenous Every 6 hours 11/27/23 0933 11/29/23 0723   11/24/23 1400  erythromycin  500 mg in sodium chloride  0.9 % 100 mL IVPB  Status:  Discontinued        500 mg 100 mL/hr over 60 Minutes Intravenous Every 8 hours 11/24/23 1027 11/27/23 0933   11/23/23 2000  erythromycin  250 mg in sodium chloride  0.9 % 100 mL IVPB  Status:  Discontinued        250 mg 100 mL/hr over 60 Minutes Intravenous Every 8 hours 11/23/23 1744 11/24/23 1027   11/23/23 1200  erythromycin  (E-MYCIN ) tablet 250 mg  Status:  Discontinued        250 mg Oral 3 times daily before meals 11/23/23 1004 11/23/23 1744   11/18/23 1200  erythromycin  (E-MYCIN ) tablet 250 mg  Status:  Discontinued        250 mg Oral 2 times daily with meals 11/18/23 0720 11/19/23 0734   11/13/23 1000  acyclovir   (ZOVIRAX ) tablet 400 mg  Status:  Discontinued        400 mg Oral 2 times daily 11/13/23 0503 11/19/23 0739        Assessment/Plan S/p diagnostic laparoscopy by Dr. [name] on 11/03/23  - Intra-op: Small bowel appeared intermittently dilated and injected consistent with inflammation, small amount of clear ascites, mesenteric edema and injection.  No obstruction present however he does have a very long, mobile small bowel mesentery concerning for intermittent volvulus  - Small bowel follow through on 8/22 with no passage of contrast from stomach to small bowel was noted within the first 3 hours of the exam despite positioning patient to encourage gastric emptying. Patient was returned to the floor and KUB images at 4 hrs, 8 hrs, and 24 hrs after contrast administration were done. Delay films with contrast in the rectum with gaseous distention of small bowel with gas-filled colon. Bx results reviewed.  - He has had trial of erythromycin  and reported as intolerant to Reglan  (hx of Tardive Dyskinesia) - Small bowel endoscopy 8/28 by GI w/ small HH, mild inflammation characterized by erosions from NG tube trauma was found in the gastric body, no evidence of significant pathology in the entire examined duodenum and no evidence of significant pathology at 60 cm ( distal to the pylorus). Biopsy without clear etiology to explain patients symptoms.  - CT 9/2 w/ Small-bowel obstruction with transition point in the mid abdomen. Large amount of stool in the rectum with rectal wall thickening and presacral edema. 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 - IR consulted 9/3 for possible venting G-tube placement and IR felt pt's anatomy is not favorable for percutaneous G tube placement  - GI has patient on bowel regimen on TID miralax  with PRN lactulose  enema. He had Linzess  earlier in the admission. GI was looking into possible Motegrity.  - Patient remains with NGT with output > 1L  - CT A/P 9/11 w/ ongoing  dilated small bowel looks and possible persistent pSBO - Keep K >=4, Phos >= 3, Mg >= 2 and mobilize for bowel function.  - Given persistent high NG output with possible pSBO refractory to above non-operative interventions, no IR window for venting G tube, reasonable to proceed with diagnostic laparoscopy, G tube placement, possible J tube placement. Will discuss further with MD prior to scheduling.   FEN - NPO, NGT to LIWS, TPN, IVF per [facility] VTE -  SCDs, therapeutic Lovenox  ID - Cefepime /Vanc/Fluconazole  per primary   Hx of Multiple Myeloma HTN HLD PAF on Eliquis  at baseline     LOS: 32 days    David Mcgrath, [facility] 12/15/2023, 1:15 PM Please see Amion for pager number during day hours 7:00am-4:30pm

## 2023-12-15 NOTE — Progress Notes (Signed)
 PT Cancellation Note  Patient Details Name: David Mcgrath MRN: [id number] DOB: 01/10/1951   Cancelled Treatment:       Pt declines any activity, just sitting EOB.  Please, please, please.  I can't.  I have too much going on, stated Pt.  Spouse in room also trying to encourage Pt do something.  I asked Pt is he was giving up.  He stated no but appears anxious/overwhelmed.  Spouse left room in tears.  Last mobility PT session was 12/08/23 and has had 4 attempts with cancels this week  Will ask LPT to re evaluate for CIR appropriateness as Pt most likely will be unable to tolerate 3 hours Therapy a day.    Katheryn Leap  PTA Acute  Rehabilitation Services Office M-F          [phone]

## 2023-12-15 NOTE — Op Note (Signed)
 Preoperative diagnosis: intestinal failure  Postoperative diagnosis: same   Procedure: laparoscopic jejunostomy tube insertion, endoscopic placement of gastrostomy by pull technique  Surgeon: Herlene Bureau, M.D.  Asst: Liz Simaan, PA-C  Anesthesia: general  Indications for procedure: David Mcgrath is a 73 y.o. year old male with symptoms of persistent inability to tolerate PO and dilation of small intestine. After discussion decision was made to proceed with venting g tube and j tube.  Description of procedure: The patient was brought into the operative suite. Anesthesia was administered with General endotracheal anesthesia. WHO checklist was applied. The patient was then placed in supine position. The area was prepped and draped in the usual sterile fashion.  Next, right subcostal incision was made. A 5mm trocar was used to gain access to the peritoneal cavity by optical entry technique. Pneumoperitoneum was applied with a high flow and low pressure. The laparoscope was reinserted to confirm position.  Bilateral TAP blocks were placed with Marcaine . 1 8 mm trocar was placed in the right mid abdomen, 1 5 mm trocar was placed in the right lower abdomen.   Small intestine was run from ileocecal valve back to ligament of trietz. There was 1 15 mm free floating tissue seen and sent to pathology. There was no internal hernia or evidence of transition point. Lysis was performed in one area of terminal ileum though it was not a transition point. The majority of the small intestine was chronically dilated and erythematous. Interestingly, the small intestine was dilated but only a 20-30% of the intestine was full of fluid. A portion of small intestine was chosen 40 cm distal to ligament of trietz for J tube.  A 3-0 silk was used to create an anti-mesenteric pursestring. A left skin incision was made. Enterotomy was made and j tube introduced into the abdomen via incision and into enterotomy  and advanced distally from the enterotomy. The purse string was tied down around the tube. 4 2-0 silks were used to appose the jejunum to the abdominal wall using suture passer. 3 ml of water  was put into the j tube balloon. Wafer was secured at 4 cm.  Next, the stomach was identified and appeared to be able to reach the left upper quadrant.  Next, the endoscope was intubated into the mouth and slowly advanced though the pharynx and esophagus. The stomach was identified. Body and antrum had evidence of gastritis. Transillumination was used to identify a place in the left upper quadrant area of the abdomen.   An incision was made at the point of transillumination. A 14ga needle was used to gain access to the stomach in the distal body. A looped wire was introduced and ensured by endoscopic snare. The looped wire was secured to he 24 fr gastrostomy tube and brought back through the mouth and esophagus and out the skin incision. The endoscope was reinserted to confirm the mushroom was against the stomach and abdominal wall. The wafer was secured at 2 cm. The patient tolerated the procedure well. All counts were correct.  Findings: chronically dilated small intestine, no transition point, appropriately positioned j tube and g tube  Specimen: unknown intraabdominal tissue  Implant: 24 fr gastrostomy tube, 16 fr j tube  Blood loss: 20 ml  Local anesthesia: 30 ml Marcaine   Complications: none  Herlene Bureau, M.D. General, Bariatric, & Minimally Invasive Surgery [facility]

## 2023-12-15 NOTE — Anesthesia Procedure Notes (Signed)
 Procedure Name: Intubation Date/Time: 12/15/2023 2:55 PM  Performed by: Buster Catheryn SAUNDERS, CRNAPre-anesthesia Checklist: Patient identified, Emergency Drugs available, Suction available and Patient being monitored Patient Re-evaluated:Patient Re-evaluated prior to induction Oxygen Delivery Method: Circle system utilized Preoxygenation: Pre-oxygenation with 100% oxygen Induction Type: IV induction Ventilation: Mask ventilation without difficulty Laryngoscope Size: Glidescope and 3 Tube type: Oral Number of attempts: 1 Airway Equipment and Method: Stylet and Video-laryngoscopy Placement Confirmation: ETT inserted through vocal cords under direct vision, positive ETCO2 and breath sounds checked- equal and bilateral Secured at: 22 cm Tube secured with: Tape Dental Injury: Teeth and Oropharynx as per pre-operative assessment

## 2023-12-15 NOTE — Progress Notes (Signed)
 PHARMACY - TOTAL PARENTERAL NUTRITION CONSULT NOTE   Indication: Prolonged ileus  Patient Measurements: Height: 6' 4 (193 cm) Weight: 73.3 kg (161 lb 9.6 oz) IBW/kg (Calculated) : 86.8 TPN AdjBW (KG): 94.6 Body mass index is 19.67 kg/m.  Assessment:  Pharmacy is consulted to start TPN on 73 yo male diagnosed with bowel obstruction. This admission CT abdomen shows increasing small bowel dilatation and fecalization of bowel contents involving the jejunum. No definitive transition point is identified at this time but caliber change is noted in the right mid abdomen at the junction of the jejunum and ileum. The more distal ileum is unremarkable. Some suspicion that bowel obstruction may be related to bortezomib  treatment pt receiving for multiple myeloma.   Glucose / Insulin : no hx of DM. Goal BG 140-180. Electrolytes: WNL including CorrCa 9.8 Renal: SCr < 1, BUN trending down Hepatic: LFTs WNL, alb 2.8; TG stable Intake / Output; MIVF:  -UOP adequate -NG output 500 ml documented yesterday, 700 ml this morning -LBM 9/10 GI Imaging: -Admitted from 10/14/23-10/20/23 for symptoms related to SBO.  CT: Transition point suspected in the right hemi-abdomen. Received non-operative management.  -Admitted 11/02/23 - 11/05/2023 for small bowel dilatation/concern for small bowel obstruction. CT imaging showed persistent small bowel dilatation with mild transition point noted deep in pelvis. He went to OR 11/03/23, findings were suspicious for intermittent volvulus of small bowel to explain his symptoms and CT imaging.  -8/10  CT abdomen shows increasing small bowel dilatation and fecalization of bowel contents involving the jejunum. No definitive transition point -9/4 Abd XR: likely improving ileus -9/6 Abd XR: concerning for ileus or possibly obstruction -9/8 Abd XR: slightly increased small bowel distension compared to most recent study, most consistent w/ an ileus -9/10 Abd XR: interval worsening of small  bowel dilatation -9/11 Abd XR: persistent distended small-bowel loops, with a loop in the left hemiabdomen measuring up to 5.2 cm, consistent with ileus -9/11 CT a/p: Mildly dilated fluid-filled small bowel loops into the pelvis. Several distal small bowel loops appear mildly thick walled. This could reflect infectious or inflammatory enteritis with small bowel ileus or some degree of resulting functional obstruction. Large bowel is decompressed. GI Surgeries / Procedures:  -8/1 diagnostic laparoscopy -8/28: EGD, enteroscopy - small hiatal hernia, gastritis, duodenum/jejunum appeared normal; several biopsies taken  Central access:  PICC 8/18   Nutritional Goals: Goal TPN rate is 100 mL/hr (provides 132 g of protein and 2438 kcals per day)  RD Assessment:  Estimated Needs Total Energy Estimated Needs: 2400-2600 kcals Total Protein Estimated Needs: 120-135 grams Total Fluid Estimated Needs: >/= 2.4L  Current Nutrition:  NPO and TPN  Plan:  Continue TPN at 100 mL/hr Electrolytes in TPN:  Na 50 mEq/L  K 40 mEq/L Ca 2.5 mEq/L Mg 2.5 mEq/L Phos 5 mmol/L  Cl:Ac 1:1 Add standard MVI and trace elements to TPN Monitor TPN labs on Mon/Thurs    David Mcgrath, PharmD, BCPS Clinical Pharmacist 12/15/2023 11:30 AM

## 2023-12-15 NOTE — Plan of Care (Signed)

## 2023-12-15 NOTE — Progress Notes (Addendum)
 PHARMACY - PHYSICIAN COMMUNICATION CRITICAL VALUE ALERT - BLOOD CULTURE IDENTIFICATION (BCID)  David Mcgrath is an 73 y.o. male who presented to [facility] on 11/12/2023 with a chief complaint of abdominal pain.   Assessment:  Sepsis with unclear etiology.  Urine cx from 9/10 + >100K enterococcus faecalis.  1/4 BCx bottles now growing GPCC, BCID did not detect anything.  Day #3/5 empiric abx regimen  Name of physician (or Provider) Contacted: [place]  Current antibiotics: Cefepime  + Vanc + Diflucan   Changes to prescribed antibiotics recommended:  -Continue same for now; primary team to de-escalate as clinically appropriate once additional micro data available  Results for orders placed or performed during the hospital encounter of 11/12/23  Blood Culture ID Panel (Reflexed) (Collected: 12/13/2023  3:33 PM)  Result Value Ref Range   Enterococcus faecalis NOT DETECTED NOT DETECTED   Enterococcus Faecium NOT DETECTED NOT DETECTED   Listeria monocytogenes NOT DETECTED NOT DETECTED   Staphylococcus species NOT DETECTED NOT DETECTED   Staphylococcus aureus (BCID) NOT DETECTED NOT DETECTED   Staphylococcus epidermidis NOT DETECTED NOT DETECTED   Staphylococcus lugdunensis NOT DETECTED NOT DETECTED   Streptococcus species NOT DETECTED NOT DETECTED   Streptococcus agalactiae NOT DETECTED NOT DETECTED   Streptococcus pneumoniae NOT DETECTED NOT DETECTED   Streptococcus pyogenes NOT DETECTED NOT DETECTED   A.calcoaceticus-baumannii NOT DETECTED NOT DETECTED   Bacteroides fragilis NOT DETECTED NOT DETECTED   Enterobacterales NOT DETECTED NOT DETECTED   Enterobacter cloacae complex NOT DETECTED NOT DETECTED   Escherichia coli NOT DETECTED NOT DETECTED   Klebsiella aerogenes NOT DETECTED NOT DETECTED   Klebsiella oxytoca NOT DETECTED NOT DETECTED   Klebsiella pneumoniae NOT DETECTED NOT DETECTED   Proteus species NOT DETECTED NOT DETECTED   Salmonella species NOT DETECTED NOT  DETECTED   Serratia marcescens NOT DETECTED NOT DETECTED   Haemophilus influenzae NOT DETECTED NOT DETECTED   Neisseria meningitidis NOT DETECTED NOT DETECTED   Pseudomonas aeruginosa NOT DETECTED NOT DETECTED   Stenotrophomonas maltophilia NOT DETECTED NOT DETECTED   Candida albicans NOT DETECTED NOT DETECTED   Candida auris NOT DETECTED NOT DETECTED   Candida glabrata NOT DETECTED NOT DETECTED   Candida krusei NOT DETECTED NOT DETECTED   Candida parapsilosis NOT DETECTED NOT DETECTED   Candida tropicalis NOT DETECTED NOT DETECTED   Cryptococcus neoformans/gattii NOT DETECTED NOT DETECTED    Rosaline Millet PharmD 12/15/2023  6:27 AM

## 2023-12-15 NOTE — Progress Notes (Signed)
 PROGRESS NOTE    David Mcgrath  FMW:[id number] DOB: 17-Jul-1950 DOA: 11/12/2023 PCP: [name], DO    Brief Narrative:   David Mcgrath is a 73 y.o. male with past medical history significant for HTN, HLD, paroxysmal atrial fibrillation on Eliquis , anxiety/depression, multiple myeloma (diagnosed March 2025) and received Bortezomib  infusion 11/09/2023, polyneuropathy who presented to [facility] ED on 11/12/2023 with worsening abdominal pain/distention.  Evaluation in the ED with imaging noted to have persistent ileus.  General surgery and GI consulted, underwent upper GI series and small bowel follow-through with confirm ration of delayed gastric emptying; although notable contrast in the rectum confirms not complete obstruction.  Additionally underwent endoscopy and exploratory laparoscopy with no clear findings of radiology for his symptoms.  Remains on low intermittent suction via NG tube with TPN for nutrition.  Initial plan for possible IR placement of GJ tube, unable to be completed due to poor windows.  Assessment & Plan:   Enterococcus bacteremia/UTI Patient with elevated temperature of 100.7 F, tachycardic, tachypneic on 12/13/2023.  Complicated by TPN use with PICC line in place.  Repeat labs with WBC count 6.0, lactic acid 0.8.  Procalcitonin 0.12. -- Urine culture: > 100K Enterococcus, susceptibilities pending -- Blood cultures x 2: 1 out of 4 (aerobic bottle only) + GPC's in clusters, pending further identification/susceptibility -- Continue empiric antibiotics with vancomycin , cefepime , fluconazole  -- Supportive care, monitor fever curve, repeat labs in the a.m.  Ileus, prolonged Partial small bowel obstruction Severe constipation Patient returning to the ED with worsening abdominal pain, distention.  Imaging notable for continued ileus, small bowel obstruction unlikely given contrast continues to the colon with large stool burden noted.  Patient underwent EGD,  exploratory laparoscopy without any clear findings of supine imaging or symptoms.  IR unable to place GJ tube due to poor windows.  Patient did not tolerate erythromycin  trial.  Avoid neostigmine per GI, also patient did not tolerate Reglan  due to side effect (tardive dyskinesia).  Had acute onset abdominal pain on 9/6 with large bowel movement after Linzess  administration. -- GI/general surgery following, appreciate assistance -- Small bowel follow-through study shows worsening small bowel dilation despite NGT to L WIS x 1 week -- Continue NG tube to LWIS; 700 mL output past 24 hours; continue to monitor -- Continue TPN -- Compazine /Zofran  as needed nausea/vomiting -- Lactulose  enema twice daily as needed severe constipation -- General Surgery plans for venting gastrostomy and feeding jejunostomy placement today  Severe protein calorie malnutrition Body mass index is 19.67 kg/m.  Nutrition Status: Nutrition Problem: Severe Malnutrition Etiology: acute illness (recurrent small bowel obstruction) Signs/Symptoms: severe fat depletion, severe muscle depletion, energy intake < or equal to 50% for > or equal to 5 days, percent weight loss (22% in 1 month) Percent weight loss: 22 % (in 1 month) Interventions: Refer to RD note for recommendations, TPN -- Continue TPN  Normocytic anemia Anemia panel with iron 17, TIBC 185, ferritin 196, folate 8.2, vitamin B12 984.  Patient transfused 2 unit PRBCs on 12/05/2023. -- Hgb 12.6>>>>7.1>9.4>10.4 -- CBC in the am  Hypokalemia Hypomagnesemia Repleted.  Currently on TPN  Multiple myeloma Follows with medical oncology outpatient, Dr. Federico.    HTN Paroxysmal atrial fibrillation Not on rate controlling/antihypertensives at baseline.  On Eliquis  at baseline. -- Continue Lovenox  80 mg subcutaneously every 12 hours   HLD Holding home Zetia , pravastatin  due to requirement of NG tube to suction   Anxiety/depression Holding home Prozac  due to  requirement of NG  tube to suction   Peripheral neuropathy Holding home gabapentin    GERD: -- Protonix  40 mg IV every 12h   Moderate protein calorie malnutrition  DVT prophylaxis: Place and maintain sequential compression device Start: 11/16/23 1142    Code Status: Full Code Family Communication: Spouse present at bedside this morning  Disposition Plan:  Level of care: Telemetry Status is: Inpatient Remains inpatient appropriate because: On TPN, venting gastrostomy, feeding jejunostomy placement by general surgery today    Consultants:  [facility] General surgery IR  Procedures:  Small bowel enteroscopy, Dr. Rosalie 8/28 venting gastrostomy, feeding jejunostomy placement by general surgery; Dr. Stevie pending today  Antimicrobials:  Vancomycin  9/10>> Cefepime  9/10>> Fluconazole  9/10>>   Subjective: Patient seen examined bedside, lying in bed.  Spouse present at bedside.  No significant change overnight, feels ill, very weak.  Remains on TPN with NG tube to low wall intermittent suction now for many weeks without improvement of his prolonged ileus.  Discussed with general surgery PA this morning regarding recommendations for venting gastrostomy and feeding jejunostomy placement, pending for later today. Patient denies headache, no chest pain, no shortness of breath, no abdominal pain, no current nausea/vomiting/diarrhea.  No acute events overnight per nursing.  Objective: Vitals:   12/14/23 2122 12/15/23 0615 12/15/23 0745 12/15/23 1140  BP: 100/60 125/77 136/81 (!) 123/95  Pulse: 96 100 100 95  Resp: 20 18 (!) 31 20  Temp: 98.8 F (37.1 C) 97.8 F (36.6 C) 98.8 F (37.1 C) 99.7 F (37.6 C)  TempSrc: Oral Oral Axillary Axillary  SpO2: 99% 99% 98% 92%  Weight:      Height:        Intake/Output Summary (Last 24 hours) at 12/15/2023 1212 Last data filed at 12/15/2023 9356 Gross per 24 hour  Intake 744.32 ml  Output 2200 ml  Net -1455.68 ml   Filed Weights    12/08/23 0426 12/11/23 0500 12/12/23 0500  Weight: 79.8 kg 76.4 kg 73.3 kg    Examination:  Physical Exam: GEN: NAD, alert and oriented x 3, thin, chronically ill/frail in appearance, appears older than stated age; NG tube noted to low wall intermittent suction HEENT: NCAT, PERRL, EOMI, sclera clear, MMM PULM: CTAB w/o wheezes/crackles, normal respiratory effort, on room air CV: RRR w/o M/G/R GI: abd soft, NTND, + BS MSK: no peripheral edema, moves all extremities independently NEURO: No focal neurological deficit PSYCH: Depressed mood, flat affect Integumentary: dry/intact, no rashes or wounds    Data Reviewed: I have personally reviewed following labs and imaging studies  CBC: Recent Labs  Lab 12/10/23 0628 12/11/23 0553 12/13/23 1537 12/14/23 0510 12/15/23 0500  WBC 5.1 5.3 6.0 7.4 9.8  HGB 9.5* 9.4* 10.4* 10.2* 10.4*  HCT 29.4* 30.3* 33.6* 33.7* 33.1*  MCV 98.7 98.7 98.0 98.5 99.1  PLT 321 345 372 364 355   Basic Metabolic Panel: Recent Labs  Lab 12/09/23 0617 12/10/23 0628 12/11/23 0552 12/13/23 1537 12/14/23 0510 12/15/23 0500  NA 138 140 139 137 136 137  K 4.3 4.4 4.4 4.9 4.6 4.7  CL 103 105 104 104 107 108  CO2 24 25 26  21* 18* 20*  GLUCOSE 98 110* 103* 125* 115* 116*  BUN 25* 32* 30* 52* 40* 33*  CREATININE 0.54* 0.59* 0.58* 0.88 0.64 0.54*  CALCIUM  8.7* 7.6* 8.7* 8.8* 8.9 8.9  MG 2.2 2.3 2.1  --  2.0  --   PHOS 4.2 4.3 3.6  --  3.6  --    GFR: Estimated Creatinine  Clearance: 86.5 mL/min (A) (by C-G formula based on SCr of 0.54 mg/dL (L)). Liver Function Tests: Recent Labs  Lab 12/11/23 0552 12/13/23 1537 12/14/23 0510 12/15/23 0500  AST 25 40 47* 41  ALT 24 32 44 43  ALKPHOS 135* 144* 139* 140*  BILITOT 0.4 0.4 0.4 0.4  PROT 5.9* 6.8 6.5 6.5  ALBUMIN  2.6* 2.9* 2.8* 2.8*   No results for input(s): LIPASE, AMYLASE in the last 168 hours. No results for input(s): AMMONIA in the last 168 hours. Coagulation Profile: No results for  input(s): INR, PROTIME in the last 168 hours. Cardiac Enzymes: No results for input(s): CKTOTAL, CKMB, CKMBINDEX, TROPONINI in the last 168 hours. BNP (last 3 results) No results for input(s): PROBNP in the last 8760 hours. HbA1C: No results for input(s): HGBA1C in the last 72 hours. CBG: Recent Labs  Lab 12/14/23 1129 12/14/23 1750 12/15/23 0012 12/15/23 0620 12/15/23 1143  GLUCAP 110* 78 97 97 94   Lipid Profile: No results for input(s): CHOL, HDL, LDLCALC, TRIG, CHOLHDL, LDLDIRECT in the last 72 hours.  Thyroid  Function Tests: No results for input(s): TSH, T4TOTAL, FREET4, T3FREE, THYROIDAB in the last 72 hours. Anemia Panel: No results for input(s): VITAMINB12, FOLATE, FERRITIN, TIBC, IRON, RETICCTPCT in the last 72 hours. Sepsis Labs: Recent Labs  Lab 12/13/23 1429 12/14/23 0510  PROCALCITON  --  0.12  LATICACIDVEN 0.8  --     Recent Results (from the past 240 hours)  Culture, blood (Routine X 2) w Reflex to ID Panel     Status: None (Preliminary result)   Collection Time: 12/13/23  3:33 PM   Specimen: BLOOD RIGHT ARM  Result Value Ref Range Status   Specimen Description   Final    BLOOD RIGHT ARM Performed at [facility], [address] [place], [place] [address]    Special Requests   Final    BOTTLES DRAWN AEROBIC AND ANAEROBIC Blood Culture adequate volume Performed at [facility], [address] [place], [place] [address]    Culture  Setup Time   Final    GRAM POSITIVE COCCI IN CLUSTERS AEROBIC BOTTLE ONLY CRITICAL RESULT CALLED TO, READ BACK BY AND VERIFIED WITH: PHARMD MICHELLE L [phone] [id number] FCP Performed at [facility], [address] [place], [place] [address]    Culture GRAM POSITIVE COCCI  Final   Report Status PENDING  Incomplete  Blood Culture ID Panel (Reflexed)     Status: None   Collection Time: 12/13/23  3:33 PM  Result Value Ref Range Status    Enterococcus faecalis NOT DETECTED NOT DETECTED Final   Enterococcus Faecium NOT DETECTED NOT DETECTED Final   Listeria monocytogenes NOT DETECTED NOT DETECTED Final   Staphylococcus species NOT DETECTED NOT DETECTED Final   Staphylococcus aureus (BCID) NOT DETECTED NOT DETECTED Final   Staphylococcus epidermidis NOT DETECTED NOT DETECTED Final   Staphylococcus lugdunensis NOT DETECTED NOT DETECTED Final   Streptococcus species NOT DETECTED NOT DETECTED Final   Streptococcus agalactiae NOT DETECTED NOT DETECTED Final   Streptococcus pneumoniae NOT DETECTED NOT DETECTED Final   Streptococcus pyogenes NOT DETECTED NOT DETECTED Final   A.calcoaceticus-baumannii NOT DETECTED NOT DETECTED Final   Bacteroides fragilis NOT DETECTED NOT DETECTED Final   Enterobacterales NOT DETECTED NOT DETECTED Final   Enterobacter cloacae complex NOT DETECTED NOT DETECTED Final   Escherichia coli NOT DETECTED NOT DETECTED Final   Klebsiella aerogenes NOT DETECTED NOT DETECTED Final   Klebsiella oxytoca NOT DETECTED  NOT DETECTED Final   Klebsiella pneumoniae NOT DETECTED NOT DETECTED Final   Proteus species NOT DETECTED NOT DETECTED Final   Salmonella species NOT DETECTED NOT DETECTED Final   Serratia marcescens NOT DETECTED NOT DETECTED Final   Haemophilus influenzae NOT DETECTED NOT DETECTED Final   Neisseria meningitidis NOT DETECTED NOT DETECTED Final   Pseudomonas aeruginosa NOT DETECTED NOT DETECTED Final   Stenotrophomonas maltophilia NOT DETECTED NOT DETECTED Final   Candida albicans NOT DETECTED NOT DETECTED Final   Candida auris NOT DETECTED NOT DETECTED Final   Candida glabrata NOT DETECTED NOT DETECTED Final   Candida krusei NOT DETECTED NOT DETECTED Final   Candida parapsilosis NOT DETECTED NOT DETECTED Final   Candida tropicalis NOT DETECTED NOT DETECTED Final   Cryptococcus neoformans/gattii NOT DETECTED NOT DETECTED Final    Comment: Performed at [facility], [address],  [place], [place] [address]  Culture, blood (Routine X 2) w Reflex to ID Panel     Status: None (Preliminary result)   Collection Time: 12/13/23  3:35 PM   Specimen: BLOOD RIGHT HAND  Result Value Ref Range Status   Specimen Description   Final    BLOOD RIGHT HAND Performed at [facility], [address], [place], [place] [address]    Special Requests   Final    BOTTLES DRAWN AEROBIC AND ANAEROBIC Blood Culture adequate volume Performed at [facility], [address] [place], [place] [address]    Culture   Final    NO GROWTH 2 DAYS Performed at [facility], [address], [place], [place] [address]    Report Status PENDING  Incomplete  Urine Culture (for pregnant, neutropenic or urologic patients or patients with an indwelling urinary catheter)     Status: Abnormal   Collection Time: 12/13/23  5:30 PM   Specimen: Urine, Clean Catch  Result Value Ref Range Status   Specimen Description   Final    URINE, CLEAN CATCH Performed at [facility], [address], [place], [place] [address]    Special Requests   Final    NONE Performed at [facility], [address], [place], [place] [address]    Culture >=100,000 COLONIES/mL ENTEROCOCCUS FAECALIS (A)  Final   Report Status 12/15/2023 FINAL  Final   Organism ID, Bacteria ENTEROCOCCUS FAECALIS (A)  Final      Susceptibility   Enterococcus faecalis - MIC*    AMPICILLIN  <=2 SENSITIVE Sensitive     NITROFURANTOIN <=16 SENSITIVE Sensitive     VANCOMYCIN  1 SENSITIVE Sensitive     * >=100,000 COLONIES/mL ENTEROCOCCUS FAECALIS         Radiology Studies: CT ABDOMEN PELVIS W CONTRAST Result Date: 12/14/2023 CLINICAL DATA:  Abdominal pain, postop.  Prolonged postop ileus. EXAM: CT ABDOMEN AND PELVIS WITH CONTRAST TECHNIQUE: Multidetector CT imaging of the abdomen and pelvis was performed using the standard protocol following bolus administration of intravenous contrast.  RADIATION DOSE REDUCTION: This exam was performed according to the departmental dose-optimization program which includes automated exposure control, adjustment of the mA and/or kV according to patient size and/or use of iterative reconstruction technique. CONTRAST:  OMNIPAQUE  IOHEXOL  300 MG/ML  SOLN COMPARISON:  12/05/2023 FINDINGS: Lower chest: Ground-glass opacities in both lower lobes are similar to prior study. No effusions. Coronary artery calcifications. Hepatobiliary: No focal hepatic abnormality. Gallbladder unremarkable. Pancreas: No focal abnormality or ductal dilatation. Spleen: No focal abnormality.  Normal size. Adrenals/Urinary Tract:  No adrenal abnormality. No focal renal abnormality. No stones or hydronephrosis. Urinary bladder is unremarkable. Stomach/Bowel: NG tube in the stomach which is decompressed. There are several mildly dilated fluid-filled small bowel loops into the pelvis. Distal small bowel loops are decompressed. Large bowel is decompressed. A few of the right lower quadrant small bowel loops appear mildly thick walled. Vascular/Lymphatic: Aortoiliac atherosclerosis. No evidence of aneurysm or adenopathy. Reproductive: No visible focal abnormality. Other: No free fluid or free air. Musculoskeletal: No acute bony abnormality. IMPRESSION: Mildly dilated fluid-filled small bowel loops into the pelvis. Several distal small bowel loops appear mildly thick walled. This could reflect infectious or inflammatory enteritis with small bowel ileus or some degree of resulting functional obstruction. Large bowel is decompressed. NG tube coils in the stomach. Continued ground-glass opacities in the lower lobes, similar to prior study. Aortic atherosclerosis, coronary artery disease. Electronically Signed   By: Franky Crease M.D.   On: 12/14/2023 17:40   DG Abd 1 View Result Date: 12/14/2023 EXAM: 1 VIEW XRAY OF THE ABDOMEN 12/14/2023 07:34:00 AM COMPARISON: 12/13/2023 CLINICAL HISTORY: [id number]  Ileus (HCC) [id number]. F/u ileus FINDINGS: BOWEL: Persistent distended small-bowel loops. Within the left hemiabdomen there is a small bowel loop measuring up to 5.2 cm. SOFT TISSUES: No opaque urinary calculi. BONES: No acute osseous abnormality. IMPRESSION: 1. Persistent distended small-bowel loops, with a loop in the left hemiabdomen measuring up to 5.2 cm, consistent with ileus. Electronically signed by: Waddell Calk MD 12/14/2023 08:01 AM EDT RP Workstation: [id number]   DG Abd Portable 1V-Small Bowel Obstruction Protocol-initial, 8 hr delay Result Date: 12/13/2023 CLINICAL DATA:  8 hour delayed image. EXAM: PORTABLE ABDOMEN - 1 VIEW COMPARISON:  Radiograph dated 12/11/2023. FINDINGS: Enteric tube in similar position. Persistent diffuse dilatation of the small bowel measure up to 4.5 cm. Overall interval worsening of small bowel dilatation compared to prior radiograph. No definite oral contrast noted. Degenerative changes of the spine. No acute osseous pathology. IMPRESSION: Interval worsening of small bowel dilatation compared to prior radiograph. Electronically Signed   By: Vanetta Chou M.D.   On: 12/13/2023 18:08   DG CHEST PORT 1 VIEW Result Date: 12/13/2023 CLINICAL DATA:  [id number] SOB (shortness of breath) [id number] EXAM: PORTABLE CHEST - 1 VIEW COMPARISON:  11/18/2023 FINDINGS: Esophagogastric tube is well positioned terminating in the stomach. Left PICC in place terminating in the high right atrium. Low lung volumes with bronchovascular crowding. Streaky left basilar atelectasis. No focal airspace consolidation, pleural effusion, or pneumothorax. No cardiomegaly. Aortic atherosclerosis. IMPRESSION: 1. Low lung volumes with streaky left basilar atelectasis. 2. Well-positioned esophagogastric tube terminating in the stomach. Electronically Signed   By: Rogelia Myers M.D.   On: 12/13/2023 14:34        Scheduled Meds:  [MAR Hold] Chlorhexidine  Gluconate Cloth  6 each Topical Q2200   [MAR Hold]  enoxaparin  (LOVENOX ) injection  1 mg/kg Subcutaneous Q12H   [MAR Hold] fluticasone   1 spray Each Nare Daily   [MAR Hold] metoprolol  tartrate  5 mg Intravenous Q8H   [MAR Hold] pantoprazole  (PROTONIX ) IV  40 mg Intravenous Q12H   [MAR Hold] polyethylene glycol  17 g Oral TID   [MAR Hold] sodium chloride  flush  10-40 mL Intracatheter Q12H   [MAR Hold] sodium chloride  flush  10-40 mL Intracatheter Q12H   Continuous Infusions:  [MAR Hold] ceFEPime  (MAXIPIME ) IV 2 g (12/15/23 0527)   [MAR Hold] fluconazole  (DIFLUCAN ) IV 400 mg (12/14/23 1644)   lactated ringers  75 mL/hr at 12/15/23 1205  TPN ADULT (ION) 100 mL/hr at 12/14/23 1825   TPN ADULT (ION)     [MAR Hold] vancomycin  1,000 mg (12/15/23 0934)     LOS: 32 days    Time spent: 54 minutes spent on 12/15/2023 caring for this patient face-to-face including chart review, ordering labs/tests, documenting, discussion with nursing staff, consultants, updating family and interview/physical exam    Camellia PARAS [place], DO [facility] Available via Epic secure chat 7am-7pm After these hours, please refer to coverage provider listed on [email or web address] 12/15/2023, 12:12 PM

## 2023-12-15 NOTE — Transfer of Care (Signed)
 Immediate Anesthesia Transfer of Care Note  Patient: David Mcgrath  Procedure(s) Performed: INSERTION, GASTROSTOMY/JEJUNOSTOMY TUBE, PERCUTANEOUS  Patient Location: PACU  Anesthesia Type:General  Level of Consciousness: drowsy and patient cooperative  Airway & Oxygen Therapy: Patient Spontanous Breathing and Patient connected to face mask oxygen  Post-op Assessment: Report given to RN and Post -op Vital signs reviewed and stable  Post vital signs: Reviewed and stable  Last Vitals:  Vitals Value Taken Time  BP 111/57 12/15/23 16:35  Temp    Pulse 86 12/15/23 16:39  Resp 23 12/15/23 16:39  SpO2 100 % 12/15/23 16:39  Vitals shown include unfiled device data.  Last Pain:  Vitals:   12/15/23 1153  TempSrc:   PainSc: 0-No pain      Patients Stated Pain Goal: 0 (12/15/23 0853)  Complications: No notable events documented.

## 2023-12-15 NOTE — Progress Notes (Addendum)
 Discussed with Dr. Herlene Righter Kinsinger from surgery.  As patient has prolonged ileus requiring NG tube to low intermittent suction for almost 3 weeks or more without any improvement on imaging and concern for worsening malnutrition on IV TPN, he is being considered for venting gastrostomy and feeding jejunostomy placement today.  He has been taken to the OR. Dr. Burnette will follow in a.m.SABRA

## 2023-12-16 DIAGNOSIS — K567 Ileus, unspecified: Secondary | ICD-10-CM | POA: Diagnosis not present

## 2023-12-16 LAB — CBC
HCT: 30.9 % — ABNORMAL LOW (ref 39.0–52.0)
Hemoglobin: 9.3 g/dL — ABNORMAL LOW (ref 13.0–17.0)
MCH: 29.8 pg (ref 26.0–34.0)
MCHC: 30.1 g/dL (ref 30.0–36.0)
MCV: 99 fL (ref 80.0–100.0)
Platelets: 318 K/uL (ref 150–400)
RBC: 3.12 MIL/uL — ABNORMAL LOW (ref 4.22–5.81)
RDW: 16.1 % — ABNORMAL HIGH (ref 11.5–15.5)
WBC: 10.1 K/uL (ref 4.0–10.5)
nRBC: 0 % (ref 0.0–0.2)

## 2023-12-16 LAB — COMPREHENSIVE METABOLIC PANEL WITH GFR
ALT: 38 U/L (ref 0–44)
AST: 31 U/L (ref 15–41)
Albumin: 2.9 g/dL — ABNORMAL LOW (ref 3.5–5.0)
Alkaline Phosphatase: 120 U/L (ref 38–126)
Anion gap: 11 (ref 5–15)
BUN: 31 mg/dL — ABNORMAL HIGH (ref 8–23)
CO2: 18 mmol/L — ABNORMAL LOW (ref 22–32)
Calcium: 8.9 mg/dL (ref 8.9–10.3)
Chloride: 107 mmol/L (ref 98–111)
Creatinine, Ser: 0.53 mg/dL — ABNORMAL LOW (ref 0.61–1.24)
GFR, Estimated: >60 mL/min (ref >60)
Glucose, Bld: 137 mg/dL — ABNORMAL HIGH (ref 70–99)
Potassium: 4.8 mmol/L (ref 3.5–5.1)
Sodium: 136 mmol/L (ref 135–145)
Total Bilirubin: 0.3 mg/dL (ref 0.0–1.2)
Total Protein: 6.5 g/dL (ref 6.5–8.1)

## 2023-12-16 LAB — GLUCOSE, CAPILLARY
Glucose-Capillary: 128 mg/dL — ABNORMAL HIGH (ref 70–99)
Glucose-Capillary: 128 mg/dL — ABNORMAL HIGH (ref 70–99)
Glucose-Capillary: 137 mg/dL — ABNORMAL HIGH (ref 70–99)
Glucose-Capillary: 190 mg/dL — ABNORMAL HIGH (ref 70–99)
Glucose-Capillary: 98 mg/dL (ref 70–99)

## 2023-12-16 MED ORDER — SODIUM CHLORIDE 0.9 % IV SOLN
3.0000 g | Freq: Four times a day (QID) | INTRAVENOUS | Status: AC
  Administered 2023-12-16 – 2023-12-20 (×15): 3 g via INTRAVENOUS
  Filled 2023-12-16 (×16): qty 8

## 2023-12-16 MED ORDER — TRAVASOL 10 % IV SOLN
INTRAVENOUS | Status: AC
  Filled 2023-12-16: qty 1320

## 2023-12-16 MED ORDER — STERILE WATER FOR INJECTION IJ SOLN
INTRAMUSCULAR | Status: AC
  Administered 2023-12-16: 10 mL
  Filled 2023-12-16: qty 10

## 2023-12-16 NOTE — Progress Notes (Signed)
 1 Day Post-Op   Subjective/Chief Complaint: Patient without complaint.  Bleeding around feeding tube stopped.   Objective: Vital signs in last 24 hours: Temp:  [97.6 F (36.4 C)-99.7 F (37.6 C)] 98.3 F (36.8 C) (09/13 0446) Pulse Rate:  [79-100] 85 (09/13 0446) Resp:  [16-31] 20 (09/13 0446) BP: (111-136)/(57-95) 113/64 (09/13 0446) SpO2:  [92 %-100 %] 99 % (09/13 0446) Weight:  [82.9 kg] 82.9 kg (09/13 0500) Last BM Date : 12/15/23  Intake/Output from previous day: 09/12 0701 - 09/13 0700 In: 5980.4 [I.V.:3870.4; NG/GT:160; IV Piggyback:1950] Out: 1605 [Urine:1050; Emesis/NG output:225; Drains:325; Blood:5] Intake/Output this shift: No intake/output data recorded.  Abdomen: Port sites clean dry intact.  G-tube site and J-tube site are clean.  Dressings removed and no ongoing bleeding noted.  Abdomen soft nontender.  Lab Results:  Recent Labs    12/15/23 0500 12/16/23 0535  WBC 9.8 10.1  HGB 10.4* 9.3*  HCT 33.1* 30.9*  PLT 355 318   BMET Recent Labs    12/15/23 0500 12/16/23 0535  NA 137 136  K 4.7 4.8  CL 108 107  CO2 20* 18*  GLUCOSE 116* 137*  BUN 33* 31*  CREATININE 0.54* 0.53*  CALCIUM  8.9 8.9   PT/INR No results for input(s): LABPROT, INR in the last 72 hours. ABG No results for input(s): PHART, HCO3 in the last 72 hours.  Invalid input(s): PCO2, PO2  Studies/Results: CT ABDOMEN PELVIS W CONTRAST Result Date: 12/14/2023 CLINICAL DATA:  Abdominal pain, postop.  Prolonged postop ileus. EXAM: CT ABDOMEN AND PELVIS WITH CONTRAST TECHNIQUE: Multidetector CT imaging of the abdomen and pelvis was performed using the standard protocol following bolus administration of intravenous contrast. RADIATION DOSE REDUCTION: This exam was performed according to the departmental dose-optimization program which includes automated exposure control, adjustment of the mA and/or kV according to patient size and/or use of iterative reconstruction technique.  CONTRAST:  OMNIPAQUE  IOHEXOL  300 MG/ML  SOLN COMPARISON:  12/05/2023 FINDINGS: Lower chest: Ground-glass opacities in both lower lobes are similar to prior study. No effusions. Coronary artery calcifications. Hepatobiliary: No focal hepatic abnormality. Gallbladder unremarkable. Pancreas: No focal abnormality or ductal dilatation. Spleen: No focal abnormality.  Normal size. Adrenals/Urinary Tract: No adrenal abnormality. No focal renal abnormality. No stones or hydronephrosis. Urinary bladder is unremarkable. Stomach/Bowel: NG tube in the stomach which is decompressed. There are several mildly dilated fluid-filled small bowel loops into the pelvis. Distal small bowel loops are decompressed. Large bowel is decompressed. A few of the right lower quadrant small bowel loops appear mildly thick walled. Vascular/Lymphatic: Aortoiliac atherosclerosis. No evidence of aneurysm or adenopathy. Reproductive: No visible focal abnormality. Other: No free fluid or free air. Musculoskeletal: No acute bony abnormality. IMPRESSION: Mildly dilated fluid-filled small bowel loops into the pelvis. Several distal small bowel loops appear mildly thick walled. This could reflect infectious or inflammatory enteritis with small bowel ileus or some degree of resulting functional obstruction. Large bowel is decompressed. NG tube coils in the stomach. Continued ground-glass opacities in the lower lobes, similar to prior study. Aortic atherosclerosis, coronary artery disease. Electronically Signed   By: Franky Crease M.D.   On: 12/14/2023 17:40   DG Abd 1 View Result Date: 12/14/2023 EXAM: 1 VIEW XRAY OF THE ABDOMEN 12/14/2023 07:34:00 AM COMPARISON: 12/13/2023 CLINICAL HISTORY: 01250 Ileus ([facility]CC) 801250. F/u ileus FINDINGS: BOWEL: Persistent distended small-bowel loops. Within the left hemiabdomen there is a small bowel loop measuring up to 5.2 cm. SOFT TISSUES: No opaque urinary calculi. BONES: No acute  osseous abnormality. IMPRESSION:  1. Persistent distended small-bowel loops, with a loop in the left hemiabdomen measuring up to 5.2 cm, consistent with ileus. Electronically signed by: Waddell Calk MD 12/14/2023 08:01 AM EDT RP Workstation: [id number]    Anti-infectives: Anti-infectives (From admission, onward)    Start     Dose/Rate Route Frequency Ordered Stop   12/14/23 1400  fluconazole  (DIFLUCAN ) IVPB 400 mg        400 mg 100 mL/hr over 120 Minutes Intravenous Every 24 hours 12/13/23 1536     12/13/23 2200  vancomycin  (VANCOCIN ) IVPB 1000 mg/200 mL premix        1,000 mg 200 mL/hr over 60 Minutes Intravenous 2 times daily 12/13/23 1536     12/13/23 1630  fluconazole  (DIFLUCAN ) IVPB 800 mg  Status:  Discontinued        800 mg 200 mL/hr over 120 Minutes Intravenous  Once 12/13/23 1536 12/13/23 1558   12/13/23 1630  ceFEPIme  (MAXIPIME ) 2 g in sodium chloride  0.9 % 100 mL IVPB        2 g 200 mL/hr over 30 Minutes Intravenous Every 8 hours 12/13/23 1536     12/13/23 1630  vancomycin  (VANCOCIN ) IVPB 1000 mg/200 mL premix        1,000 mg 200 mL/hr over 60 Minutes Intravenous  Once 12/13/23 1536 12/13/23 1707   12/13/23 1630  fluconazole  (DIFLUCAN ) IVPB 400 mg        400 mg 100 mL/hr over 120 Minutes Intravenous Every 1 hr x 2 12/13/23 1558 12/13/23 1909   11/27/23 1200  erythromycin  500 mg in sodium chloride  0.9 % 100 mL IVPB        500 mg 100 mL/hr over 60 Minutes Intravenous Every 6 hours 11/27/23 0933 11/29/23 0723   11/24/23 1400  erythromycin  500 mg in sodium chloride  0.9 % 100 mL IVPB  Status:  Discontinued        500 mg 100 mL/hr over 60 Minutes Intravenous Every 8 hours 11/24/23 1027 11/27/23 0933   11/23/23 2000  erythromycin  250 mg in sodium chloride  0.9 % 100 mL IVPB  Status:  Discontinued        250 mg 100 mL/hr over 60 Minutes Intravenous Every 8 hours 11/23/23 1744 11/24/23 1027   11/23/23 1200  erythromycin  (E-MYCIN ) tablet 250 mg  Status:  Discontinued        250 mg Oral 3 times daily before meals  11/23/23 1004 11/23/23 1744   11/18/23 1200  erythromycin  (E-MYCIN ) tablet 250 mg  Status:  Discontinued        250 mg Oral 2 times daily with meals 11/18/23 0720 11/19/23 0734   11/13/23 1000  acyclovir  (ZOVIRAX ) tablet 400 mg  Status:  Discontinued        400 mg Oral 2 times daily 11/13/23 0503 11/19/23 0739       Assessment/Plan: s/p Procedure(s) with comments: INSERTION, GASTROSTOMY/JEJUNOSTOMY TUBE, PERCUTANEOUS (N/A) - PEG placement with Endo, Laparoscopic J-Tube S/p diagnostic laparoscopy by Dr. [name] on 11/03/23  - Intra-op: Small bowel appeared intermittently dilated and injected consistent with inflammation, small amount of clear ascites, mesenteric edema and injection.  No obstruction present however he does have a very long, mobile small bowel mesentery concerning for intermittent volvulus  - Small bowel follow through on 8/22 with no passage of contrast from stomach to small bowel was noted within the first 3 hours of the exam despite positioning patient to encourage gastric emptying. Patient was returned to the floor and KUB  images at 4 hrs, 8 hrs, and 24 hrs after contrast administration were done. Delay films with contrast in the rectum with gaseous distention of small bowel with gas-filled colon. Bx results reviewed.  - He has had trial of erythromycin  and reported as intolerant to Reglan  (hx of Tardive Dyskinesia) - Small bowel endoscopy 8/28 by GI w/ small HH, mild inflammation characterized by erosions from NG tube trauma was found in the gastric body, no evidence of significant pathology in the entire examined duodenum and no evidence of significant pathology at 60 cm ( distal to the pylorus). Biopsy without clear etiology to explain patients symptoms.  - CT 9/2 w/ Small-bowel obstruction with transition point in the mid abdomen. Large amount of stool in the rectum with rectal wall thickening and presacral edema. 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 - IR consulted  9/3 for possible venting G-tube placement and IR felt pt's anatomy is not favorable for percutaneous G tube placement  - GI has patient on bowel regimen on TID miralax  with PRN lactulose  enema. He had Linzess  earlier in the admission. GI was looking into possible Motegrity.  - Patient remains with NGT with output > 1L and xray today with persistent distended small-bowel loops  - No indication for emergency surgery - Will get repeat CT A/P with IV contrast and PO contrast via NGT to further evaluate.  - Keep K >=4, Phos >= 3, Mg >= 2 and mobilize for bowel function.  - We will follow with you   FEN - NPO, NGT to LIWS, TPN, IVF per [facility] VTE - SCDs, therapeutic Lovenox  ID - Cefepime /Vanc/Fluconazole  per primary    Hx of Multiple Myeloma HTN HLD PAF on Eliquis  at baseline    Remove NG tube.  Keep gastric tube to gravity.  Hold anticoagulation for 24 more hours then may restart tomorrow  May use J-tube for tube feeds starting tomorrow.  Recommend trickle feeds to start given previous history.      LOS: 33 days    Debby LABOR Kenlei Safi 12/16/2023

## 2023-12-16 NOTE — Progress Notes (Signed)
 Subjective: Less bloated after gastrostomy. Minimal flatus.  Objective: Vital signs in last 24 hours: Temp:  [97.6 F (36.4 C)-98.4 F (36.9 C)] 98.3 F (36.8 C) (09/13 0446) Pulse Rate:  [79-93] 85 (09/13 0446) Resp:  [16-20] 20 (09/13 0446) BP: (111-135)/(57-87) 113/64 (09/13 0446) SpO2:  [97 %-100 %] 99 % (09/13 0446) Weight:  [82.9 kg] 82.9 kg (09/13 0500) Weight change:  Last BM Date : 12/15/23  PE: GEN:  Debilitated, cachectic-appearing ABD:  Soft, gastrostomy tube in place, jejunostomy in place NEURO:  No encephalopathy  Lab Results: CBC    Component Value Date/Time   WBC 10.1 12/16/2023 0535   RBC 3.12 (L) 12/16/2023 0535   HGB 9.3 (L) 12/16/2023 0535   HGB 11.7 (L) 11/09/2023 1512   HGB 10.9 (L) 08/02/2023 0942   HCT 30.9 (L) 12/16/2023 0535   HCT 32.0 (L) 08/02/2023 0942   PLT 318 12/16/2023 0535   PLT 290 11/09/2023 1512   PLT 343 08/02/2023 0942   MCV 99.0 12/16/2023 0535   MCV 96 08/02/2023 0942   MCH 29.8 12/16/2023 0535   MCHC 30.1 12/16/2023 0535   RDW 16.1 (H) 12/16/2023 0535   RDW 15.1 08/02/2023 0942   LYMPHSABS 0.7 11/27/2023 0510   MONOABS 0.6 11/27/2023 0510   EOSABS 0.2 11/27/2023 0510   BASOSABS 0.1 11/27/2023 0510  CMP     Component Value Date/Time   NA 136 12/16/2023 0535   NA 141 08/02/2023 0942   K 4.8 12/16/2023 0535   CL 107 12/16/2023 0535   CO2 18 (L) 12/16/2023 0535   GLUCOSE 137 (H) 12/16/2023 0535   BUN 31 (H) 12/16/2023 0535   BUN 7 (L) 08/02/2023 0942   CREATININE 0.53 (L) 12/16/2023 0535   CREATININE 0.72 11/09/2023 1512   CALCIUM  8.9 12/16/2023 0535   PROT 6.5 12/16/2023 0535   PROT 7.0 01/09/2020 1445   ALBUMIN  2.9 (L) 12/16/2023 0535   AST 31 12/16/2023 0535   AST 36 11/09/2023 1512   ALT 38 12/16/2023 0535   ALT 32 11/09/2023 1512   ALKPHOS 120 12/16/2023 0535   BILITOT 0.3 12/16/2023 0535   BILITOT 0.5 11/09/2023 1512   EGFR 97 08/02/2023 0942   GFRNONAA >60 12/16/2023 0535   GFRNONAA >60 11/09/2023  1512   Assessment:   Prolonged ileus refractory to conservative management s/p venting gastrostomy and jejunostomy (for nutrition).  Plan:   Management of tube feeds and venting gastrostomy per surgical team. [facility] will follow at a distance; please call us  when/if questions arise.   David Mcgrath 12/16/2023, 11:42 AM   Cell [phone] If no answer or after 5 PM call [phone]

## 2023-12-16 NOTE — Plan of Care (Signed)
  Problem: Education: Goal: Knowledge of General Education information will improve Description: Including pain rating scale, medication(s)/side effects and non-pharmacologic comfort measures Outcome: Progressing   Problem: Health Behavior/Discharge Planning: Goal: Ability to manage health-related needs will improve Outcome: Progressing   Problem: Clinical Measurements: Goal: Ability to maintain clinical measurements within normal limits will improve Outcome: Progressing Goal: Will remain free from infection Outcome: Progressing Goal: Diagnostic test results will improve Outcome: Progressing Goal: Respiratory complications will improve Outcome: Progressing Goal: Cardiovascular complication will be avoided Outcome: Progressing   Problem: Activity: Goal: Risk for activity intolerance will decrease Outcome: Progressing   Problem: Nutrition: Goal: Adequate nutrition will be maintained Outcome: Progressing   Problem: Coping: Goal: Level of anxiety will decrease Outcome: Progressing   Problem: Elimination: Goal: Will not experience complications related to bowel motility Outcome: Progressing Goal: Will not experience complications related to urinary retention Outcome: Progressing   Problem: Pain Managment: Goal: General experience of comfort will improve and/or be controlled Outcome: Progressing   Problem: Safety: Goal: Ability to remain free from injury will improve Outcome: Progressing   Problem: Skin Integrity: Goal: Risk for impaired skin integrity will decrease Outcome: Progressing   Problem: Nutrition Goal: Patient maintains adequate hydration Outcome: Progressing Goal: Patient maintains weight Outcome: Progressing   Problem: Education: Goal: Individualized Educational Video(s) Outcome: Progressing   Problem: Coping: Goal: Ability to adjust to condition or change in health will improve Outcome: Progressing   Problem: Fluid Volume: Goal: Ability to  maintain a balanced intake and output will improve Outcome: Progressing   Problem: Health Behavior/Discharge Planning: Goal: Ability to identify and utilize available resources and services will improve Outcome: Progressing Goal: Ability to manage health-related needs will improve Outcome: Progressing   Problem: Nutritional: Goal: Maintenance of adequate nutrition will improve Outcome: Progressing Goal: Progress toward achieving an optimal weight will improve Outcome: Progressing   Problem: Skin Integrity: Goal: Risk for impaired skin integrity will decrease Outcome: Progressing   Problem: Tissue Perfusion: Goal: Adequacy of tissue perfusion will improve Outcome: Progressing

## 2023-12-16 NOTE — Progress Notes (Signed)
 No acute changes this shift, patient NGT removed this am, GT output this shift , patient given meds as ordered, report abd pain at the end of shift, educated that he needed to allow staff to reposition him but he refused this shift, stated he should be ready to participate in therapy by Monday , in bed resting, call light in reach

## 2023-12-16 NOTE — Progress Notes (Signed)
 PROGRESS NOTE    David Mcgrath  FMW:[id number] DOB: Apr 23, 1950 DOA: 11/12/2023 PCP: [name], DO    Brief Narrative:   David Mcgrath is a 73 y.o. male with past medical history significant for HTN, HLD, paroxysmal atrial fibrillation on Eliquis , anxiety/depression, multiple myeloma (diagnosed March 2025) and received Bortezomib  infusion 11/09/2023, polyneuropathy who presented to [facility] ED on 11/12/2023 with worsening abdominal pain/distention.  Evaluation in the ED with imaging noted to have persistent ileus.  General surgery and GI consulted, underwent upper GI series and small bowel follow-through with confirm ration of delayed gastric emptying; although notable contrast in the rectum confirms not complete obstruction.  Additionally underwent endoscopy and exploratory laparoscopy with no clear findings of radiology for his symptoms.  Remains on low intermittent suction via NG tube with TPN for nutrition.  Initial plan for possible IR placement of GJ tube, unable to be completed due to poor windows.  Assessment & Plan:   Enterococcus bacteremia/UTI Patient with elevated temperature of 100.7 F, tachycardic, tachypneic on 12/13/2023.  Complicated by TPN use with PICC line in place.  Repeat labs with WBC count 6.0, lactic acid 0.8.  Procalcitonin 0.12. -- Urine culture: > 100K Enterococcus faecalis -- Blood cultures x 2: 1 out of 4 (aerobic bottle only) + GPC's in clusters, pending further identification/susceptibility -- Continue empiric antibiotics with vancomycin , cefepime , fluconazole  -- Supportive care, monitor fever curve, repeat labs in the a.m.  Ileus, prolonged Partial small bowel obstruction Severe constipation Patient returning to the ED with worsening abdominal pain, distention.  Imaging notable for continued ileus, small bowel obstruction unlikely given contrast continues to the colon with large stool burden noted.  Patient underwent EGD, exploratory  laparoscopy without any clear findings of supine imaging or symptoms.  IR unable to place GJ tube due to poor windows.  Patient did not tolerate erythromycin  trial.  Avoid neostigmine per GI, also patient did not tolerate Reglan  due to side effect (tardive dyskinesia).  Had acute onset abdominal pain on 9/6 with large bowel movement after Linzess  administration.  General surgery was reconsulted and patient underwent laparoscopic jejunostomy and gastrostomy tube placement by Dr. Stevie on 12/15/2023. -- GI/general surgery following, appreciate assistance -- Continue TPN -- Compazine /Zofran  as needed nausea/vomiting -- Lactulose  enema twice daily as needed severe constipation -- Continue gastric tube to gravity; hopeful to use J-tube for tube feeds starting tomorrow with trickle feeds.  Severe protein calorie malnutrition Body mass index is 22.25 kg/m.  Nutrition Status: Nutrition Problem: Severe Malnutrition Etiology: acute illness (recurrent small bowel obstruction) Signs/Symptoms: severe fat depletion, severe muscle depletion, energy intake < or equal to 50% for > or equal to 5 days, percent weight loss (22% in 1 month) Percent weight loss: 22 % (in 1 month) Interventions: Refer to RD note for recommendations, TPN -- Continue TPN  Normocytic anemia Anemia panel with iron 17, TIBC 185, ferritin 196, folate 8.2, vitamin B12 984.  Patient transfused 2 unit PRBCs on 12/05/2023. -- Hgb 12.6>>>>7.1>9.4>10.4>9.3 -- CBC in the am  Hypokalemia Hypomagnesemia Repleted.  Currently on TPN  Multiple myeloma Follows with medical oncology outpatient, Dr. Federico.    HTN Paroxysmal atrial fibrillation Not on rate controlling/antihypertensives at baseline.  On Eliquis  at baseline. -- Hold anticoagulation for additional 24 hours per general surgery   HLD Holding home Zetia , pravastatin     Anxiety/depression Holding home Prozac     Peripheral neuropathy Holding home gabapentin    GERD: --  Protonix  40 mg IV every 12h  Moderate protein calorie malnutrition Nutrition Status: Nutrition Problem: Severe Malnutrition Etiology: acute illness (recurrent small bowel obstruction) Signs/Symptoms: severe fat depletion, severe muscle depletion, energy intake < or equal to 50% for > or equal to 5 days, percent weight loss (22% in 1 month) Percent weight loss: 22 % (in 1 month) Interventions: Refer to RD note for recommendations, TPN    DVT prophylaxis: Place and maintain sequential compression device Start: 11/16/23 1142    Code Status: Full Code Family Communication: Spouse present at bedside this morning  Disposition Plan:  Level of care: Telemetry Status is: Inpatient Remains inpatient appropriate because: On TPN    Consultants:  [facility] General surgery IR  Procedures:  Small bowel enteroscopy, Dr. Rosalie 8/28 venting gastrostomy, feeding jejunostomy placement by general surgery; Dr. Stevie 9/12  Antimicrobials:  Vancomycin  9/10>> Cefepime  9/10>> Fluconazole  9/10>>   Subjective: Patient seen examined bedside, lying in bed.  Spouse present at bedside.  In much better spirits this morning.  NG tube removed by general surgery after having gastrostomy/jejunostomy tube placement yesterday.  Continue to hold anticoagulation and potential for starting tube feeds/trickle feeds tomorrow.  Excited to watch some college football games today. Patient denies headache, no chest pain, no shortness of breath, no abdominal pain, no current nausea/vomiting/diarrhea.  No acute events overnight per nursing staff.  Objective: Vitals:   12/15/23 2207 12/16/23 0050 12/16/23 0446 12/16/23 0500  BP: 135/77 131/87 113/64   Pulse: 93 87 85   Resp: 20 19 20    Temp:  98.4 F (36.9 C) 98.3 F (36.8 C)   TempSrc:  Oral Oral   SpO2:  98% 99%   Weight:    82.9 kg  Height:        Intake/Output Summary (Last 24 hours) at 12/16/2023 1145 Last data filed at 12/16/2023 0600 Gross per 24  hour  Intake 5980.36 ml  Output 1605 ml  Net 4375.36 ml   Filed Weights   12/11/23 0500 12/12/23 0500 12/16/23 0500  Weight: 76.4 kg 73.3 kg 82.9 kg    Examination:  Physical Exam: GEN: NAD, alert and oriented x 3, thin, chronically ill/frail in appearance HEENT: NCAT, PERRL, EOMI, sclera clear, MMM PULM: CTAB w/o wheezes/crackles, normal respiratory effort, on room air CV: RRR w/o M/G/R GI: abd soft, NTND, + BS; noted gastrostomy to gravity and jejunostomy tube in place MSK: no peripheral edema, moves all extremities independently NEURO: No focal neurological deficit PSYCH: Depressed mood, flat affect Integumentary: dry/intact, no rashes or wounds    Data Reviewed: I have personally reviewed following labs and imaging studies  CBC: Recent Labs  Lab 12/11/23 0553 12/13/23 1537 12/14/23 0510 12/15/23 0500 12/16/23 0535  WBC 5.3 6.0 7.4 9.8 10.1  HGB 9.4* 10.4* 10.2* 10.4* 9.3*  HCT 30.3* 33.6* 33.7* 33.1* 30.9*  MCV 98.7 98.0 98.5 99.1 99.0  PLT 345 372 364 355 318   Basic Metabolic Panel: Recent Labs  Lab 12/10/23 0628 12/11/23 0552 12/13/23 1537 12/14/23 0510 12/15/23 0500 12/16/23 0535  NA 140 139 137 136 137 136  K 4.4 4.4 4.9 4.6 4.7 4.8  CL 105 104 104 107 108 107  CO2 25 26 21* 18* 20* 18*  GLUCOSE 110* 103* 125* 115* 116* 137*  BUN 32* 30* 52* 40* 33* 31*  CREATININE 0.59* 0.58* 0.88 0.64 0.54* 0.53*  CALCIUM  7.6* 8.7* 8.8* 8.9 8.9 8.9  MG 2.3 2.1  --  2.0  --   --   PHOS 4.3 3.6  --  3.6  --   --  GFR: Estimated Creatinine Clearance: 97.9 mL/min (A) (by C-G formula based on SCr of 0.53 mg/dL (L)). Liver Function Tests: Recent Labs  Lab 12/11/23 0552 12/13/23 1537 12/14/23 0510 12/15/23 0500 12/16/23 0535  AST 25 40 47* 41 31  ALT 24 32 44 43 38  ALKPHOS 135* 144* 139* 140* 120  BILITOT 0.4 0.4 0.4 0.4 0.3  PROT 5.9* 6.8 6.5 6.5 6.5  ALBUMIN  2.6* 2.9* 2.8* 2.8* 2.9*   No results for input(s): LIPASE, AMYLASE in the last 168  hours. No results for input(s): AMMONIA in the last 168 hours. Coagulation Profile: No results for input(s): INR, PROTIME in the last 168 hours. Cardiac Enzymes: No results for input(s): CKTOTAL, CKMB, CKMBINDEX, TROPONINI in the last 168 hours. BNP (last 3 results) No results for input(s): PROBNP in the last 8760 hours. HbA1C: No results for input(s): HGBA1C in the last 72 hours. CBG: Recent Labs  Lab 12/15/23 0012 12/15/23 0620 12/15/23 1143 12/16/23 0000 12/16/23 0624  GLUCAP 97 97 94 190* 137*   Lipid Profile: No results for input(s): CHOL, HDL, LDLCALC, TRIG, CHOLHDL, LDLDIRECT in the last 72 hours.  Thyroid  Function Tests: No results for input(s): TSH, T4TOTAL, FREET4, T3FREE, THYROIDAB in the last 72 hours. Anemia Panel: No results for input(s): VITAMINB12, FOLATE, FERRITIN, TIBC, IRON, RETICCTPCT in the last 72 hours. Sepsis Labs: Recent Labs  Lab 12/13/23 1429 12/14/23 0510  PROCALCITON  --  0.12  LATICACIDVEN 0.8  --     Recent Results (from the past 240 hours)  Culture, blood (Routine X 2) w Reflex to ID Panel     Status: None (Preliminary result)   Collection Time: 12/13/23  3:33 PM   Specimen: BLOOD RIGHT ARM  Result Value Ref Range Status   Specimen Description   Final    BLOOD RIGHT ARM Performed at [facility], [address], [place], [place] [address]    Special Requests   Final    BOTTLES DRAWN AEROBIC AND ANAEROBIC Blood Culture adequate volume Performed at [facility], [address], [place], [place] [address]    Culture  Setup Time   Final    GRAM POSITIVE COCCI IN CLUSTERS AEROBIC BOTTLE ONLY CRITICAL RESULT CALLED TO, READ BACK BY AND VERIFIED WITH: PHARMD MICHELLE L [phone] FCP    Culture   Final    GRAM POSITIVE COCCI IDENTIFICATION TO FOLLOW Performed at [facility], [address] [place], [place] [address]    Report Status PENDING   Incomplete  Blood Culture ID Panel (Reflexed)     Status: None   Collection Time: 12/13/23  3:33 PM  Result Value Ref Range Status   Enterococcus faecalis NOT DETECTED NOT DETECTED Final   Enterococcus Faecium NOT DETECTED NOT DETECTED Final   Listeria monocytogenes NOT DETECTED NOT DETECTED Final   Staphylococcus species NOT DETECTED NOT DETECTED Final   Staphylococcus aureus (BCID) NOT DETECTED NOT DETECTED Final   Staphylococcus epidermidis NOT DETECTED NOT DETECTED Final   Staphylococcus lugdunensis NOT DETECTED NOT DETECTED Final   Streptococcus species NOT DETECTED NOT DETECTED Final   Streptococcus agalactiae NOT DETECTED NOT DETECTED Final   Streptococcus pneumoniae NOT DETECTED NOT DETECTED Final   Streptococcus pyogenes NOT DETECTED NOT DETECTED Final   A.calcoaceticus-baumannii NOT DETECTED NOT DETECTED Final   Bacteroides fragilis NOT DETECTED NOT DETECTED Final   Enterobacterales NOT DETECTED NOT DETECTED Final   Enterobacter cloacae complex NOT DETECTED NOT DETECTED Final   Escherichia coli NOT  DETECTED NOT DETECTED Final   Klebsiella aerogenes NOT DETECTED NOT DETECTED Final   Klebsiella oxytoca NOT DETECTED NOT DETECTED Final   Klebsiella pneumoniae NOT DETECTED NOT DETECTED Final   Proteus species NOT DETECTED NOT DETECTED Final   Salmonella species NOT DETECTED NOT DETECTED Final   Serratia marcescens NOT DETECTED NOT DETECTED Final   Haemophilus influenzae NOT DETECTED NOT DETECTED Final   Neisseria meningitidis NOT DETECTED NOT DETECTED Final   Pseudomonas aeruginosa NOT DETECTED NOT DETECTED Final   Stenotrophomonas maltophilia NOT DETECTED NOT DETECTED Final   Candida albicans NOT DETECTED NOT DETECTED Final   Candida auris NOT DETECTED NOT DETECTED Final   Candida glabrata NOT DETECTED NOT DETECTED Final   Candida krusei NOT DETECTED NOT DETECTED Final   Candida parapsilosis NOT DETECTED NOT DETECTED Final   Candida tropicalis NOT DETECTED NOT DETECTED Final    Cryptococcus neoformans/gattii NOT DETECTED NOT DETECTED Final    Comment: Performed at [facility], [address], [place], [place] [address]  Culture, blood (Routine X 2) w Reflex to ID Panel     Status: None (Preliminary result)   Collection Time: 12/13/23  3:35 PM   Specimen: BLOOD RIGHT HAND  Result Value Ref Range Status   Specimen Description   Final    BLOOD RIGHT HAND Performed at [facility], [address] [place], [place] [address]    Special Requests   Final    BOTTLES DRAWN AEROBIC AND ANAEROBIC Blood Culture adequate volume Performed at [facility], [address], [place], [place] [address]    Culture   Final    NO GROWTH 3 DAYS Performed at [facility], [address] [place], [place] [address]    Report Status PENDING  Incomplete  Urine Culture (for pregnant, neutropenic or urologic patients or patients with an indwelling urinary catheter)     Status: Abnormal   Collection Time: 12/13/23  5:30 PM   Specimen: Urine, Clean Catch  Result Value Ref Range Status   Specimen Description   Final    URINE, CLEAN CATCH Performed at [facility], [address], [place], [place] [address]    Special Requests   Final    NONE Performed at [facility], [address], [place], [place] [address]    Culture >=100,000 COLONIES/mL ENTEROCOCCUS FAECALIS (A)  Final   Report Status 12/15/2023 FINAL  Final   Organism ID, Bacteria ENTEROCOCCUS FAECALIS (A)  Final      Susceptibility   Enterococcus faecalis - MIC*    AMPICILLIN  <=2 SENSITIVE Sensitive     NITROFURANTOIN <=16 SENSITIVE Sensitive     VANCOMYCIN  1 SENSITIVE Sensitive     * >=100,000 COLONIES/mL ENTEROCOCCUS FAECALIS         Radiology Studies: CT ABDOMEN PELVIS W CONTRAST Result Date: 12/14/2023 CLINICAL DATA:  Abdominal pain, postop.  Prolonged postop ileus. EXAM: CT ABDOMEN AND PELVIS WITH CONTRAST TECHNIQUE: Multidetector CT  imaging of the abdomen and pelvis was performed using the standard protocol following bolus administration of intravenous contrast. RADIATION DOSE REDUCTION: This exam was performed according to the departmental dose-optimization program which includes automated exposure control, adjustment of the mA and/or kV according to patient size and/or use of iterative reconstruction technique. CONTRAST:  OMNIPAQUE  IOHEXOL  300 MG/ML  SOLN COMPARISON:  12/05/2023 FINDINGS: Lower chest: Ground-glass opacities in both lower lobes are similar to prior study. No effusions. Coronary artery calcifications. Hepatobiliary: No focal hepatic  abnormality. Gallbladder unremarkable. Pancreas: No focal abnormality or ductal dilatation. Spleen: No focal abnormality.  Normal size. Adrenals/Urinary Tract: No adrenal abnormality. No focal renal abnormality. No stones or hydronephrosis. Urinary bladder is unremarkable. Stomach/Bowel: NG tube in the stomach which is decompressed. There are several mildly dilated fluid-filled small bowel loops into the pelvis. Distal small bowel loops are decompressed. Large bowel is decompressed. A few of the right lower quadrant small bowel loops appear mildly thick walled. Vascular/Lymphatic: Aortoiliac atherosclerosis. No evidence of aneurysm or adenopathy. Reproductive: No visible focal abnormality. Other: No free fluid or free air. Musculoskeletal: No acute bony abnormality. IMPRESSION: Mildly dilated fluid-filled small bowel loops into the pelvis. Several distal small bowel loops appear mildly thick walled. This could reflect infectious or inflammatory enteritis with small bowel ileus or some degree of resulting functional obstruction. Large bowel is decompressed. NG tube coils in the stomach. Continued ground-glass opacities in the lower lobes, similar to prior study. Aortic atherosclerosis, coronary artery disease. Electronically Signed   By: Franky Crease M.D.   On: 12/14/2023 17:40         Scheduled Meds:  acetaminophen   1,000 mg Oral TID   Chlorhexidine  Gluconate Cloth  6 each Topical Q2200   fluticasone   1 spray Each Nare Daily   metoprolol  tartrate  5 mg Intravenous Q8H   pantoprazole  (PROTONIX ) IV  40 mg Intravenous Q12H   polyethylene glycol  17 g Oral TID   sodium chloride  flush  10-40 mL Intracatheter Q12H   sodium chloride  flush  10-40 mL Intracatheter Q12H   Continuous Infusions:  ceFEPime  (MAXIPIME ) IV 2 g (12/16/23 0532)   fluconazole  (DIFLUCAN ) IV 400 mg (12/15/23 1822)   TPN ADULT (ION) 100 mL/hr at 12/15/23 1759   TPN ADULT (ION)     vancomycin  1,000 mg (12/16/23 1031)     LOS: 33 days    Time spent: 48 minutes spent on 12/16/2023 caring for this patient face-to-face including chart review, ordering labs/tests, documenting, discussion with nursing staff, consultants, updating family and interview/physical exam    Camellia PARAS [place], DO [facility] Available via Epic secure chat 7am-7pm After these hours, please refer to coverage provider listed on [email or web address] 12/16/2023, 11:45 AM

## 2023-12-16 NOTE — Progress Notes (Addendum)
 PHARMACY - TOTAL PARENTERAL NUTRITION CONSULT NOTE   Indication: Prolonged ileus  Patient Measurements: Height: 6' 4 (193 cm) Weight: 82.9 kg (182 lb 12.2 oz) IBW/kg (Calculated) : 86.8 TPN AdjBW (KG): 94.6 Body mass index is 22.25 kg/m.  Assessment:  Pharmacy is consulted to start TPN on 73 yo male diagnosed with bowel obstruction. This admission CT abdomen shows increasing small bowel dilatation and fecalization of bowel contents involving the jejunum. No definitive transition point is identified at this time but caliber change is noted in the right mid abdomen at the junction of the jejunum and ileum. The more distal ileum is unremarkable. Some suspicion that bowel obstruction may be related to bortezomib  treatment pt receiving for multiple myeloma.   Glucose / Insulin : no hx of DM. Goal BG 140-180. Currently not on insulin   Electrolytes: K trending up, CO2 low, all other lytes WNL including CorrCa 9.8 Renal: SCr < 1 stable , BUN trending down Hepatic: LFTs WNL, alb 2.9; TG stable Intake / Output; MIVF:  -UOP adequate -NG output 225 ml; drain output documented yesterday -LBM 9/11 x 2  GI Imaging: -Admitted from 10/14/23-10/20/23 for symptoms related to SBO.  CT: Transition point suspected in the right hemi-abdomen. Received non-operative management.  -Admitted 11/02/23 - 11/05/2023 for small bowel dilatation/concern for small bowel obstruction. CT imaging showed persistent small bowel dilatation with mild transition point noted deep in pelvis. He went to OR 11/03/23, findings were suspicious for intermittent volvulus of small bowel to explain his symptoms and CT imaging.  -8/10  CT abdomen shows increasing small bowel dilatation and fecalization of bowel contents involving the jejunum. No definitive transition point -9/4 Abd XR: likely improving ileus -9/6 Abd XR: concerning for ileus or possibly obstruction -9/8 Abd XR: slightly increased small bowel distension compared to most  recent study, most consistent w/ an ileus -9/10 Abd XR: interval worsening of small bowel dilatation -9/11 Abd XR: persistent distended small-bowel loops, with a loop in the left hemiabdomen measuring up to 5.2 cm, consistent with ileus -9/11 CT a/p: Mildly dilated fluid-filled small bowel loops into the pelvis. Several distal small bowel loops appear mildly thick walled. This could reflect infectious or inflammatory enteritis with small bowel ileus or some degree of resulting functional obstruction. Large bowel is decompressed. GI Surgeries / Procedures:  -8/1 diagnostic laparoscopy -8/28: EGD, enteroscopy - small hiatal hernia, gastritis, duodenum/jejunum appeared normal; several biopsies taken -9/12: venting gastrostomy and feeding jejunostomy placement  Central access:  PICC 8/18   Nutritional Goals: Goal TPN rate is 100 mL/hr (provides 132 g of protein and 2438 kcals per day)  RD Assessment:  Estimated Needs Total Energy Estimated Needs: 2400-2600 kcals Total Protein Estimated Needs: 120-135 grams Total Fluid Estimated Needs: >/= 2.4L  Current Nutrition:  NPO and TPN Surgery planning to start trickle feeds per J tube starting 9/14 and removal of NG tube  Plan:  Continue TPN at 100 mL/hr Electrolytes in TPN:  Na 50 mEq/L  K 40 mEq/L Ca 2.5 mEq/L Mg 2.5 mEq/L Phos 5 mmol/L  Cl:Ac - max acetate  Add standard MVI and trace elements to TPN Monitor TPN labs on Mon/Thurs   Marget Hench, PharmD, BCPS Clinical Pharmacist 12/16/2023 8:49 AM

## 2023-12-16 NOTE — Progress Notes (Signed)
 Surgical MD Debby Shipper at bedside. Removed gauze around Gtube and Jtube. Informed MD of output from NG and Gtube. MD verbalized to remove NG since Gtube in place. Order placed.

## 2023-12-17 DIAGNOSIS — K567 Ileus, unspecified: Secondary | ICD-10-CM | POA: Diagnosis not present

## 2023-12-17 LAB — CBC
HCT: 29.5 % — ABNORMAL LOW (ref 39.0–52.0)
Hemoglobin: 9.1 g/dL — ABNORMAL LOW (ref 13.0–17.0)
MCH: 30.3 pg (ref 26.0–34.0)
MCHC: 30.8 g/dL (ref 30.0–36.0)
MCV: 98.3 fL (ref 80.0–100.0)
Platelets: 322 K/uL (ref 150–400)
RBC: 3 MIL/uL — ABNORMAL LOW (ref 4.22–5.81)
RDW: 16.1 % — ABNORMAL HIGH (ref 11.5–15.5)
WBC: 8.1 K/uL (ref 4.0–10.5)
nRBC: 0 % (ref 0.0–0.2)

## 2023-12-17 LAB — COMPREHENSIVE METABOLIC PANEL WITH GFR
ALT: 39 U/L (ref 0–44)
AST: 37 U/L (ref 15–41)
Albumin: 2.9 g/dL — ABNORMAL LOW (ref 3.5–5.0)
Alkaline Phosphatase: 127 U/L — ABNORMAL HIGH (ref 38–126)
Anion gap: 10 (ref 5–15)
BUN: 24 mg/dL — ABNORMAL HIGH (ref 8–23)
CO2: 23 mmol/L (ref 22–32)
Calcium: 8.8 mg/dL — ABNORMAL LOW (ref 8.9–10.3)
Chloride: 103 mmol/L (ref 98–111)
Creatinine, Ser: 0.42 mg/dL — ABNORMAL LOW (ref 0.61–1.24)
GFR, Estimated: >60 mL/min (ref >60)
Glucose, Bld: 103 mg/dL — ABNORMAL HIGH (ref 70–99)
Potassium: 4 mmol/L (ref 3.5–5.1)
Sodium: 136 mmol/L (ref 135–145)
Total Bilirubin: 0.3 mg/dL (ref 0.0–1.2)
Total Protein: 6.4 g/dL — ABNORMAL LOW (ref 6.5–8.1)

## 2023-12-17 LAB — GLUCOSE, CAPILLARY
Glucose-Capillary: 100 mg/dL — ABNORMAL HIGH (ref 70–99)
Glucose-Capillary: 104 mg/dL — ABNORMAL HIGH (ref 70–99)
Glucose-Capillary: 123 mg/dL — ABNORMAL HIGH (ref 70–99)
Glucose-Capillary: 125 mg/dL — ABNORMAL HIGH (ref 70–99)
Glucose-Capillary: 127 mg/dL — ABNORMAL HIGH (ref 70–99)
Glucose-Capillary: 129 mg/dL — ABNORMAL HIGH (ref 70–99)

## 2023-12-17 LAB — CULTURE, BLOOD (ROUTINE X 2)
Culture  Setup Time: NO GROWTH
Special Requests: ADEQUATE

## 2023-12-17 MED ORDER — ACETAMINOPHEN 650 MG RE SUPP: 650.0000 mg | Freq: Four times a day (QID) | RECTAL | Status: DC | PRN

## 2023-12-17 MED ORDER — FLUOXETINE HCL 20 MG PO CAPS
20.0000 mg | ORAL_CAPSULE | Freq: Every day | ORAL | Status: DC
  Administered 2023-12-17 – 2023-12-18 (×2): 20 mg
  Filled 2023-12-17 (×2): qty 1

## 2023-12-17 MED ORDER — TRAVASOL 10 % IV SOLN
INTRAVENOUS | Status: AC
  Filled 2023-12-17: qty 1320

## 2023-12-17 MED ORDER — OSMOLITE 1.2 CAL PO LIQD
1000.0000 mL | ORAL | Status: DC
  Administered 2023-12-17: 1000 mL

## 2023-12-17 MED ORDER — OSMOLITE 1.5 CAL PO LIQD
1000.0000 mL | ORAL | Status: DC
  Administered 2023-12-17: 1000 mL
  Filled 2023-12-17: qty 1000

## 2023-12-17 MED ORDER — SALINE SPRAY 0.65 % NA SOLN
1.0000 | NASAL | Status: DC | PRN
  Administered 2023-12-17 – 2023-12-20 (×3): 1 via NASAL
  Filled 2023-12-17: qty 44

## 2023-12-17 MED ORDER — ACETAMINOPHEN 160 MG/5ML PO SOLN: 650.0000 mg | Freq: Four times a day (QID) | ORAL | Status: DC | PRN

## 2023-12-17 MED ORDER — APIXABAN 5 MG PO TABS
5.0000 mg | ORAL_TABLET | Freq: Two times a day (BID) | ORAL | Status: DC
  Administered 2023-12-17 – 2023-12-18 (×4): 5 mg
  Filled 2023-12-17 (×4): qty 1

## 2023-12-17 MED ORDER — OXYCODONE HCL 5 MG/5ML PO SOLN
5.0000 mg | ORAL | Status: DC | PRN
Start: 2023-12-17 — End: 2023-12-21
  Filled 2023-12-17: qty 5

## 2023-12-17 NOTE — Progress Notes (Signed)
 Nutrition Follow-up  DOCUMENTATION CODES:   Severe malnutrition in context of acute illness/injury  INTERVENTION:   -  Continue goal TPN.              - TPN management per pharmacy. - If pt tolerates trickle feeds and able to begin advancing, can start weaning TPN   - Daily weights while on TPN.  - At goal, recommend the following tube feeding via J-tube: Osmolite 1.5 at 70 ml/h (1680 ml per day) When able advance by 10mL every 12 hours to reach goal Prosource TF20 60 ml 1x/d Free water  flush of 125mL q4 hours Provides 2600 kcal, 125 gm protein, 1280 ml free water  daily (TF+flush = free water  daily)   NUTRITION DIAGNOSIS:   Severe Malnutrition related to acute illness (recurrent small bowel obstruction) as evidenced by severe fat depletion, severe muscle depletion, energy intake < or equal to 50% for > or equal to 5 days, percent weight loss (22% in 1 month).  Ongoing.  GOAL:   Patient will meet greater than or equal to 90% of their needs  TPN, trickle TF be initiated   MONITOR:   Diet advancement, Labs, Weight trends  ASSESSMENT:   73 y.o. male with PMH significant for HTN, HLD, chronic anxiety/depression, polyneuropathy, multiple myeloma (diagnosed March 2025). Patient recently admitted both 7/12-7/18 and 7/31-8/3 with intractable nausea vomiting, recurrent small bowel obstruction and was treated conservatively with NG tube decompression and discharged home.  Presented back 8/10 with loose stools and cramping abdominal pain. Admitted for ileus vs partial SBO.  8/10 Admit 8/11 Soft diet 8/13 FLD 8/17 NPO; NGT placed 8/18 TPN initiated 8/28 s/p small bowel enteroscopy; NGT removed 8/29 repeat abdominal xray -> showing partial SBO vs ileus; started on clear liquid diet 8/30 NGT replaced after patient vomited; NPO 9/12 - Op, laparoscopic j-tube insertion, endoscopic placement of gastrostomy tube  9/13 - NGT removed, g-tube being used to vent  Pt continues on  full rate TPN. Surgery team placed J-tube and g-tube for venting on 9/12. Both tubes functioning and surgery team initiated trickle feeds this AM. Will adjust formula and watch for ability to be able to start increasing rate.   When tube feeds are tolerated and able to start advancing, can begin to slowly wean TPN and then add free water  flushes via j-tube  Admit weight: 83.5 kg  Current weight: 83.3 kg    Nutritionally Relevant Medications: Scheduled Meds:  pantoprazole  (PROTONIX ) IV  40 mg Intravenous Q12H   polyethylene glycol  17 g Oral TID   Continuous Infusions:  ampicillin -sulbactam (UNASYN ) IV 3 g (12/17/23 0953)   feeding supplement (OSMOLITE 1.2 CAL) 1,000 mL (12/17/23 1011)   fluconazole  (DIFLUCAN ) IV 400 mg (12/17/23 1250)   TPN ADULT (ION) 100 mL/hr at 12/16/23 1810   TPN ADULT (ION)     PRN Meds: diphenhydrAMINE , lactulose , ondansetron , phenol, prochlorperazine , simethicone   Labs Reviewed: BUN 24, creatinine 0.42 CBG ranges from 98-190 mg/dL over the last 24 hours  Lines/Drains: J-tube 16 Fr.  G-tube 24 Fr. - x 24 hours UOP - x 24 hours  Diet Order:   Diet Order     None       EDUCATION NEEDS:   Education needs have been addressed  Skin:  Skin Assessment: Skin Integrity Issues: Skin Integrity Issues:: Stage II Stage II: Sacrum Lap sites from surgery Skin tear to the left lower arm (0.5 x 0.1 cm)  Last BM:  9/7 -type 7  Height:   Ht Readings from Last 1 Encounters:  11/18/23 6' 4 (1.93 m)    Weight:   Wt Readings from Last 1 Encounters:  12/17/23 83.3 kg    Ideal Body Weight:  91.8 kg  BMI:  Body mass index is 22.35 kg/m.  Estimated Nutritional Needs:   Kcal:  2400-2600 kcals  Protein:  120-135 grams  Fluid:  >/= 2.4L    Vernell Lukes, RD, LDN, CNSC Registered Dietitian II Please reach out via secure chat

## 2023-12-17 NOTE — Plan of Care (Addendum)
 Patient's tube feeds started this morning via J tube. Kangaroo programed to 20ml/hr. MD specified to keep at that rate. 30ml of Water  flushes programed via Kangaroo pump. Patient dressing is clean, dry, intact. Tube is patent and tube feeds are adequately infusing without any complications.   Gtube continues to gravity bag and output of green thick bile.  1200-Patient refused mobility and states he did not get any rest overnight.  Problem: Education: Goal: Knowledge of General Education information will improve Description: Including pain rating scale, medication(s)/side effects and non-pharmacologic comfort measures Outcome: Progressing   Problem: Health Behavior/Discharge Planning: Goal: Ability to manage health-related needs will improve Outcome: Progressing   Problem: Clinical Measurements: Goal: Ability to maintain clinical measurements within normal limits will improve Outcome: Progressing Goal: Will remain free from infection Outcome: Progressing   Problem: Nutrition: Goal: Adequate nutrition will be maintained Outcome: Progressing

## 2023-12-17 NOTE — Progress Notes (Signed)
 2 Days Post-Op   Subjective/Chief Complaint: Patient feels well today.  No bloating.  No nausea or vomiting.   Objective: Vital signs in last 24 hours: Temp:  [97.4 F (36.3 C)-98.2 F (36.8 C)] 97.4 F (36.3 C) (09/14 0350) Pulse Rate:  [40-84] 74 (09/14 0355) Resp:  [16-18] 18 (09/14 0350) BP: (127-144)/(78-83) 144/78 (09/14 0350) SpO2:  [98 %-100 %] 99 % (09/14 0350) Weight:  [83.3 kg] 83.3 kg (09/14 0500) Last BM Date : 12/15/23  Intake/Output from previous day: 09/13 0701 - 09/14 0700 In: 3229.6 [I.V.:2651.5; IV Piggyback:568.1] Out: 3200 [Urine:2025; Drains:1175] Intake/Output this shift: No intake/output data recorded.  Abdomen: Incisions clean dry intact.  Gastrostomy tube and jejunostomy tube intact with no further bleeding.  Gastrostomy to gravity.  Lab Results:  Recent Labs    12/16/23 0535 12/17/23 0530  WBC 10.1 8.1  HGB 9.3* 9.1*  HCT 30.9* 29.5*  PLT 318 322   BMET Recent Labs    12/16/23 0535 12/17/23 0530  NA 136 136  K 4.8 4.0  CL 107 103  CO2 18* 23  GLUCOSE 137* 103*  BUN 31* 24*  CREATININE 0.53* 0.42*  CALCIUM  8.9 8.8*   PT/INR No results for input(s): LABPROT, INR in the last 72 hours. ABG No results for input(s): PHART, HCO3 in the last 72 hours.  Invalid input(s): PCO2, PO2  Studies/Results: No results found.  Anti-infectives: Anti-infectives (From admission, onward)    Start     Dose/Rate Route Frequency Ordered Stop   12/16/23 1600  Ampicillin -Sulbactam (UNASYN ) 3 g in sodium chloride  0.9 % 100 mL IVPB        3 g 200 mL/hr over 30 Minutes Intravenous Every 6 hours 12/16/23 1453     12/14/23 1400  fluconazole  (DIFLUCAN ) IVPB 400 mg        400 mg 100 mL/hr over 120 Minutes Intravenous Every 24 hours 12/13/23 1536     12/13/23 2200  vancomycin  (VANCOCIN ) IVPB 1000 mg/200 mL premix  Status:  Discontinued        1,000 mg 200 mL/hr over 60 Minutes Intravenous 2 times daily 12/13/23 1536 12/16/23 1453    12/13/23 1630  fluconazole  (DIFLUCAN ) IVPB 800 mg  Status:  Discontinued        800 mg 200 mL/hr over 120 Minutes Intravenous  Once 12/13/23 1536 12/13/23 1558   12/13/23 1630  ceFEPIme  (MAXIPIME ) 2 g in sodium chloride  0.9 % 100 mL IVPB  Status:  Discontinued        2 g 200 mL/hr over 30 Minutes Intravenous Every 8 hours 12/13/23 1536 12/16/23 1453   12/13/23 1630  vancomycin  (VANCOCIN ) IVPB 1000 mg/200 mL premix        1,000 mg 200 mL/hr over 60 Minutes Intravenous  Once 12/13/23 1536 12/13/23 1707   12/13/23 1630  fluconazole  (DIFLUCAN ) IVPB 400 mg        400 mg 100 mL/hr over 120 Minutes Intravenous Every 1 hr x 2 12/13/23 1558 12/13/23 1909   11/27/23 1200  erythromycin  500 mg in sodium chloride  0.9 % 100 mL IVPB        500 mg 100 mL/hr over 60 Minutes Intravenous Every 6 hours 11/27/23 0933 11/29/23 0723   11/24/23 1400  erythromycin  500 mg in sodium chloride  0.9 % 100 mL IVPB  Status:  Discontinued        500 mg 100 mL/hr over 60 Minutes Intravenous Every 8 hours 11/24/23 1027 11/27/23 0933   11/23/23 2000  erythromycin  250  mg in sodium chloride  0.9 % 100 mL IVPB  Status:  Discontinued        250 mg 100 mL/hr over 60 Minutes Intravenous Every 8 hours 11/23/23 1744 11/24/23 1027   11/23/23 1200  erythromycin  (E-MYCIN ) tablet 250 mg  Status:  Discontinued        250 mg Oral 3 times daily before meals 11/23/23 1004 11/23/23 1744   11/18/23 1200  erythromycin  (E-MYCIN ) tablet 250 mg  Status:  Discontinued        250 mg Oral 2 times daily with meals 11/18/23 0720 11/19/23 0734   11/13/23 1000  acyclovir  (ZOVIRAX ) tablet 400 mg  Status:  Discontinued        400 mg Oral 2 times daily 11/13/23 0503 11/19/23 0739       Assessment/Plan: s/p Procedure(s) with comments: INSERTION, GASTROSTOMY/JEJUNOSTOMY TUBE, PERCUTANEOUS (N/A) - PEG placement with Endo, Laparoscopic J-Tube  S/p diagnostic laparoscopy by Dr. [name] on 11/03/23  - Intra-op: Small bowel appeared intermittently dilated and  injected consistent with inflammation, small amount of clear ascites, mesenteric edema and injection.  No obstruction present however he does have a very long, mobile small bowel mesentery concerning for intermittent volvulus  - Small bowel follow through on 8/22 with no passage of contrast from stomach to small bowel was noted within the first 3 hours of the exam despite positioning patient to encourage gastric emptying. Patient was returned to the floor and KUB images at 4 hrs, 8 hrs, and 24 hrs after contrast administration were done. Delay films with contrast in the rectum with gaseous distention of small bowel with gas-filled colon. Bx results reviewed.  - He has had trial of erythromycin  and reported as intolerant to Reglan  (hx of Tardive Dyskinesia) - Small bowel endoscopy 8/28 by GI w/ small HH, mild inflammation characterized by erosions from NG tube trauma was found in the gastric body, no evidence of significant pathology in the entire examined duodenum and no evidence of significant pathology at 60 cm ( distal to the pylorus). Biopsy without clear etiology to explain patients symptoms.  - CT 9/2 w/ Small-bowel obstruction with transition point in the mid abdomen. Large amount of stool in the rectum with rectal wall thickening and presacral edema. 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 - IR consulted 9/3 for possible venting G-tube placement and IR felt pt's anatomy is not favorable for percutaneous G tube placement  - GI has patient on bowel regimen on TID miralax  with PRN lactulose  enema. He had Linzess  earlier in the admission. GI was looking into possible Motegrity.  -         FEN -start tube feeds at 20 cc an hour.  Do not increase at this point in time.  Continue TNA for today.  Primary team can discuss weaning starting tomorrow depending on how he tolerates tube feeding today.    VTE - SCDs, therapeutic Lovenox  ID - Cefepime /Vanc/Fluconazole  per primary      LOS:  34 days    Debby DELENA Shipper MD 12/17/2023

## 2023-12-17 NOTE — Plan of Care (Addendum)
 Pt is alert and oriented x 4 with intermittent confusion at times during overnight. Gtube 525 output. Jtube remains clamped per orders. Picc and midline dressings changed. Pt not compliant with turning. States he can shift his own weight back and forth but doesn't agree to pillow support. Pt informed of the risk with pressure injury but still refused. CHG completed.  Problem: Education: Goal: Knowledge of General Education information will improve Description: Including pain rating scale, medication(s)/side effects and non-pharmacologic comfort measures Outcome: Progressing   Problem: Health Behavior/Discharge Planning: Goal: Ability to manage health-related needs will improve Outcome: Progressing   Problem: Clinical Measurements: Goal: Ability to maintain clinical measurements within normal limits will improve Outcome: Progressing Goal: Will remain free from infection Outcome: Progressing Goal: Diagnostic test results will improve Outcome: Progressing Goal: Respiratory complications will improve Outcome: Progressing Goal: Cardiovascular complication will be avoided Outcome: Progressing   Problem: Activity: Goal: Risk for activity intolerance will decrease Outcome: Progressing   Problem: Nutrition: Goal: Adequate nutrition will be maintained Outcome: Progressing   Problem: Coping: Goal: Level of anxiety will decrease Outcome: Progressing   Problem: Elimination: Goal: Will not experience complications related to bowel motility Outcome: Progressing Goal: Will not experience complications related to urinary retention Outcome: Progressing   Problem: Pain Managment: Goal: General experience of comfort will improve and/or be controlled Outcome: Progressing   Problem: Safety: Goal: Ability to remain free from injury will improve Outcome: Progressing   Problem: Skin Integrity: Goal: Risk for impaired skin integrity will decrease Outcome: Progressing   Problem:  Nutrition Goal: Patient maintains adequate hydration Outcome: Progressing Goal: Patient maintains weight Outcome: Progressing   Problem: Coping: Goal: Ability to adjust to condition or change in health will improve Outcome: Progressing   Problem: Fluid Volume: Goal: Ability to maintain a balanced intake and output will improve Outcome: Progressing   Problem: Health Behavior/Discharge Planning: Goal: Ability to identify and utilize available resources and services will improve Outcome: Progressing Goal: Ability to manage health-related needs will improve Outcome: Progressing   Problem: Nutritional: Goal: Maintenance of adequate nutrition will improve Outcome: Progressing Goal: Progress toward achieving an optimal weight will improve Outcome: Progressing   Problem: Skin Integrity: Goal: Risk for impaired skin integrity will decrease Outcome: Progressing   Problem: Tissue Perfusion: Goal: Adequacy of tissue perfusion will improve Outcome: Progressing

## 2023-12-17 NOTE — Progress Notes (Signed)
 PHARMACY - TOTAL PARENTERAL NUTRITION CONSULT NOTE   Indication: Prolonged ileus  Patient Measurements: Height: 6' 4 (193 cm) Weight: 83.3 kg (183 lb 10.3 oz) IBW/kg (Calculated) : 86.8 TPN AdjBW (KG): 94.6 Body mass index is 22.35 kg/m.  Assessment:  Pharmacy is consulted to start TPN on 73 yo male diagnosed with bowel obstruction. This admission CT abdomen shows increasing small bowel dilatation and fecalization of bowel contents involving the jejunum. No definitive transition point is identified at this time but caliber change is noted in the right mid abdomen at the junction of the jejunum and ileum. The more distal ileum is unremarkable. Some suspicion that bowel obstruction may be related to bortezomib  treatment pt receiving for multiple myeloma.   Glucose / Insulin : no hx of DM. Goal CBG 140-180. Currently not on insulin   Electrolytes: K normalized, CO2 improved to lower end of goal, all other lytes WNL including CorrCa 9.7 Renal: SCr < 1; trending down , BUN continues to trend down Hepatic: AST/ALT WNL, AlkPhos trending up, alb 2.9; TG stable Intake / Output; MIVF:  -UOP adequate -GJ output 1175 mL documented yesterday -LBM 9/12 x 3 GI Imaging: -Admitted from 10/14/23-10/20/23 for symptoms related to SBO.  CT: Transition point suspected in the right hemi-abdomen. Received non-operative management.  -Admitted 11/02/23 - 11/05/2023 for small bowel dilatation/concern for small bowel obstruction. CT imaging showed persistent small bowel dilatation with mild transition point noted deep in pelvis. He went to OR 11/03/23, findings were suspicious for intermittent volvulus of small bowel to explain his symptoms and CT imaging.  -8/10  CT abdomen shows increasing small bowel dilatation and fecalization of bowel contents involving the jejunum. No definitive transition point -9/4 Abd XR: likely improving ileus -9/6 Abd XR: concerning for ileus or possibly obstruction -9/8 Abd XR: slightly  increased small bowel distension compared to most recent study, most consistent w/ an ileus -9/10 Abd XR: interval worsening of small bowel dilatation -9/11 Abd XR: persistent distended small-bowel loops, with a loop in the left hemiabdomen measuring up to 5.2 cm, consistent with ileus -9/11 CT a/p: Mildly dilated fluid-filled small bowel loops into the pelvis. Several distal small bowel loops appear mildly thick walled. This could reflect infectious or inflammatory enteritis with small bowel ileus or some degree of resulting functional obstruction. Large bowel is decompressed. GI Surgeries / Procedures:  -8/1 diagnostic laparoscopy -8/28: EGD, enteroscopy - small hiatal hernia, gastritis, duodenum/jejunum appeared normal; several biopsies taken -9/12: venting gastrostomy and feeding jejunostomy placement  Central access:  PICC 8/18   Nutritional Goals: Goal TPN rate is 100 mL/hr (provides 132 g of protein and 2438 kcals per day)  RD Assessment:  Estimated Needs Total Energy Estimated Needs: 2400-2600 kcals Total Protein Estimated Needs: 120-135 grams Total Fluid Estimated Needs: >/= 2.4L  Current Nutrition:  NPO and TPN Surgery planning to start trickle feeds per J tube at 22 ml/hr on 9/14  Plan:  Continue TPN at 100 mL/hr Electrolytes in TPN: no changes  Na 50 mEq/L  K 40 mEq/L Ca 2.5 mEq/L Mg 2.5 mEq/L Phos 5 mmol/L  Cl:Ac - max acetate  Add standard MVI and trace elements to TPN Monitor TPN labs on Mon/Thurs Follow up plans to wean TPN on 9/15 if tolerating tube feeds   Marget Hench, PharmD Clinical Pharmacist 12/17/2023 7:06 AM

## 2023-12-17 NOTE — Progress Notes (Signed)
 PROGRESS NOTE    David Mcgrath  FMW:[id number] DOB: Oct 04, 1950 DOA: 11/12/2023 PCP: [name], DO    Brief Narrative:   David Mcgrath is a 73 y.o. male with past medical history significant for HTN, HLD, paroxysmal atrial fibrillation on Eliquis , anxiety/depression, multiple myeloma (diagnosed March 2025) and received Bortezomib  infusion 11/09/2023, polyneuropathy who presented to [facility] ED on 11/12/2023 with worsening abdominal pain/distention.  Evaluation in the ED with imaging noted to have persistent ileus.  General surgery and GI consulted, underwent upper GI series and small bowel follow-through with confirm ration of delayed gastric emptying; although notable contrast in the rectum confirms not complete obstruction.  Additionally underwent endoscopy and exploratory laparoscopy with no clear findings of radiology for his symptoms.  Remains on low intermittent suction via NG tube with TPN for nutrition.  Initial plan for possible IR placement of GJ tube, unable to be completed due to poor windows.  Assessment & Plan:   Enterococcus faecalis UTI Patient with elevated temperature of 100.7 F, tachycardic, tachypneic on 12/13/2023.  Complicated by TPN use with PICC line in place.  Repeat labs with WBC count 6.0, lactic acid 0.8.  Procalcitonin 0.12. -- Urine culture: > 100K Enterococcus faecalis -- Blood cultures x 2: 1 out of 4 (aerobic bottle only) + MICROCOCCUS LUTEUS/LYLAE; suspect contaminant -- Continue antibiotics with Unasyn  3 g IV every 6 hours  Ileus, prolonged Partial small bowel obstruction Severe constipation Patient returning to the ED with worsening abdominal pain, distention.  Imaging notable for continued ileus, small bowel obstruction unlikely given contrast continues to the colon with large stool burden noted.  Patient underwent EGD, exploratory laparoscopy without any clear findings of supine imaging or symptoms.  IR unable to place GJ tube due to poor  windows.  Patient did not tolerate erythromycin  trial.  Avoid neostigmine per GI, also patient did not tolerate Reglan  due to side effect (tardive dyskinesia).  Had acute onset abdominal pain on 9/6 with large bowel movement after Linzess  administration.  General surgery was reconsulted and patient underwent laparoscopic jejunostomy and gastrostomy tube placement by Dr. Stevie on 12/15/2023. -- GI/general surgery following, appreciate assistance -- Continue TPN -- Compazine /Zofran  as needed nausea/vomiting -- Lactulose  enema twice daily as needed severe constipation -- Continue gastric tube to gravity; starting tube feeds via J-tube at 20 mL/h today  Severe protein calorie malnutrition Body mass index is 22.35 kg/m.  Nutrition Status: Nutrition Problem: Severe Malnutrition Etiology: acute illness (recurrent small bowel obstruction) Signs/Symptoms: severe fat depletion, severe muscle depletion, energy intake < or equal to 50% for > or equal to 5 days, percent weight loss (22% in 1 month) Percent weight loss: 22 % (in 1 month) Interventions: Refer to RD note for recommendations, TPN -- Continue TPN  Normocytic anemia Anemia panel with iron 17, TIBC 185, ferritin 196, folate 8.2, vitamin B12 984.  Patient transfused 2 unit PRBCs on 12/05/2023. -- Hgb 12.6>>>>7.1>9.4>10.4>9.3>9.1 -- CBC in the am  Hypokalemia Hypomagnesemia Repleted.  Currently on TPN, starting tube feeds today  Multiple myeloma Follows with medical oncology outpatient, Dr. Federico.    HTN Paroxysmal atrial fibrillation Not on rate controlling/antihypertensives at baseline.  On Eliquis  at baseline. -- Restart Eliquis  today  HLD Holding home Zetia , pravastatin     Anxiety/depression -- Restart Prozac  today   Peripheral neuropathy Holding home gabapentin    GERD: -- Protonix  40 mg IV every 12h   Moderate protein calorie malnutrition Nutrition Status: Nutrition Problem: Severe Malnutrition Etiology: acute  illness (recurrent small  bowel obstruction) Signs/Symptoms: severe fat depletion, severe muscle depletion, energy intake < or equal to 50% for > or equal to 5 days, percent weight loss (22% in 1 month) Percent weight loss: 22 % (in 1 month) Interventions: Refer to RD note for recommendations, TPN    DVT prophylaxis: Place and maintain sequential compression device Start: 11/16/23 1142 apixaban  (ELIQUIS ) tablet 5 mg    Code Status: Full Code Family Communication: Spouse present at bedside this morning  Disposition Plan:  Level of care: Med-Surg Status is: Inpatient Remains inpatient appropriate because: On TPN; initiating tube feeds today    Consultants:  [facility] General surgery IR  Procedures:  Small bowel enteroscopy, Dr. Rosalie 8/28 venting gastrostomy, feeding jejunostomy placement by general surgery; Dr. Stevie 9/12  Antimicrobials:  Vancomycin  9/10>> Cefepime  9/10>> Fluconazole  9/10>>   Subjective: Patient seen examined bedside, lying in bed.  Spouse present at bedside.  Seen by general surgery this morning okay to restart anticoagulation with Eliquis  and tube feeds at 20 mL/h today.  No other complaints or concerns at this time.. Patient denies headache, no chest pain, no shortness of breath, no abdominal pain, no current nausea/vomiting/diarrhea.  No acute events overnight per nursing staff.  Objective: Vitals:   12/16/23 2124 12/17/23 0350 12/17/23 0355 12/17/23 0500  BP: 127/83 (!) 144/78    Pulse: 79 (!) 40 74   Resp: 18 18    Temp: 97.9 F (36.6 C) (!) 97.4 F (36.3 C)    TempSrc: Oral Oral    SpO2: 98% 99%    Weight:    83.3 kg  Height:        Intake/Output Summary (Last 24 hours) at 12/17/2023 1220 Last data filed at 12/17/2023 0900 Gross per 24 hour  Intake 2152.88 ml  Output 2950 ml  Net -797.12 ml   Filed Weights   12/12/23 0500 12/16/23 0500 12/17/23 0500  Weight: 73.3 kg 82.9 kg 83.3 kg    Examination:  Physical Exam: GEN: NAD,  alert and oriented x 3, thin, chronically ill/frail in appearance HEENT: NCAT, PERRL, EOMI, sclera clear, MMM PULM: CTAB w/o wheezes/crackles, normal respiratory effort, on room air CV: RRR w/o M/G/R GI: abd soft, NTND, + BS; noted gastrostomy to gravity and jejunostomy tube in place MSK: no peripheral edema, moves all extremities independently NEURO: No focal neurological deficit PSYCH: Depressed mood, flat affect Integumentary: dry/intact, no rashes or wounds    Data Reviewed: I have personally reviewed following labs and imaging studies  CBC: Recent Labs  Lab 12/13/23 1537 12/14/23 0510 12/15/23 0500 12/16/23 0535 12/17/23 0530  WBC 6.0 7.4 9.8 10.1 8.1  HGB 10.4* 10.2* 10.4* 9.3* 9.1*  HCT 33.6* 33.7* 33.1* 30.9* 29.5*  MCV 98.0 98.5 99.1 99.0 98.3  PLT 372 364 355 318 322   Basic Metabolic Panel: Recent Labs  Lab 12/11/23 0552 12/13/23 1537 12/14/23 0510 12/15/23 0500 12/16/23 0535 12/17/23 0530  NA 139 137 136 137 136 136  K 4.4 4.9 4.6 4.7 4.8 4.0  CL 104 104 107 108 107 103  CO2 26 21* 18* 20* 18* 23  GLUCOSE 103* 125* 115* 116* 137* 103*  BUN 30* 52* 40* 33* 31* 24*  CREATININE 0.58* 0.88 0.64 0.54* 0.53* 0.42*  CALCIUM  8.7* 8.8* 8.9 8.9 8.9 8.8*  MG 2.1  --  2.0  --   --   --   PHOS 3.6  --  3.6  --   --   --    GFR: Estimated Creatinine Clearance:  98.3 mL/min (A) (by C-G formula based on SCr of 0.42 mg/dL (L)). Liver Function Tests: Recent Labs  Lab 12/13/23 1537 12/14/23 0510 12/15/23 0500 12/16/23 0535 12/17/23 0530  AST 40 47* 41 31 37  ALT 32 44 43 38 39  ALKPHOS 144* 139* 140* 120 127*  BILITOT 0.4 0.4 0.4 0.3 0.3  PROT 6.8 6.5 6.5 6.5 6.4*  ALBUMIN  2.9* 2.8* 2.8* 2.9* 2.9*   No results for input(s): LIPASE, AMYLASE in the last 168 hours. No results for input(s): AMMONIA in the last 168 hours. Coagulation Profile: No results for input(s): INR, PROTIME in the last 168 hours. Cardiac Enzymes: No results for input(s):  CKTOTAL, CKMB, CKMBINDEX, TROPONINI in the last 168 hours. BNP (last 3 results) No results for input(s): PROBNP in the last 8760 hours. HbA1C: No results for input(s): HGBA1C in the last 72 hours. CBG: Recent Labs  Lab 12/16/23 1856 12/16/23 2341 12/17/23 0622 12/17/23 0844 12/17/23 1131  GLUCAP 98 128* 123* 127* 125*   Lipid Profile: No results for input(s): CHOL, HDL, LDLCALC, TRIG, CHOLHDL, LDLDIRECT in the last 72 hours.  Thyroid  Function Tests: No results for input(s): TSH, T4TOTAL, FREET4, T3FREE, THYROIDAB in the last 72 hours. Anemia Panel: No results for input(s): VITAMINB12, FOLATE, FERRITIN, TIBC, IRON, RETICCTPCT in the last 72 hours. Sepsis Labs: Recent Labs  Lab 12/13/23 1429 12/14/23 0510  PROCALCITON  --  0.12  LATICACIDVEN 0.8  --     Recent Results (from the past 240 hours)  Culture, blood (Routine X 2) w Reflex to ID Panel     Status: Abnormal   Collection Time: 12/13/23  3:33 PM   Specimen: BLOOD RIGHT ARM  Result Value Ref Range Status   Specimen Description   Final    BLOOD RIGHT ARM Performed at [facility], [address], [place], [place] [address]    Special Requests   Final    BOTTLES DRAWN AEROBIC AND ANAEROBIC Blood Culture adequate volume Performed at [facility], [address] [place], [place] [address]    Culture  Setup Time   Final    GRAM POSITIVE COCCI IN CLUSTERS AEROBIC BOTTLE ONLY CRITICAL RESULT CALLED TO, READ BACK BY AND VERIFIED WITH: PHARMD MICHELLE L [id number] FCP    Culture (A)  Final    MICROCOCCUS LUTEUS/LYLAE Standardized susceptibility testing for this organism is not available. Performed at [facility], [address] [place], [place] [address]    Report Status 12/17/2023 FINAL  Final  Blood Culture ID Panel (Reflexed)     Status: None   Collection Time: 12/13/23  3:33 PM  Result Value Ref Range Status   Enterococcus faecalis  NOT DETECTED NOT DETECTED Final   Enterococcus Faecium NOT DETECTED NOT DETECTED Final   Listeria monocytogenes NOT DETECTED NOT DETECTED Final   Staphylococcus species NOT DETECTED NOT DETECTED Final   Staphylococcus aureus (BCID) NOT DETECTED NOT DETECTED Final   Staphylococcus epidermidis NOT DETECTED NOT DETECTED Final   Staphylococcus lugdunensis NOT DETECTED NOT DETECTED Final   Streptococcus species NOT DETECTED NOT DETECTED Final   Streptococcus agalactiae NOT DETECTED NOT DETECTED Final   Streptococcus pneumoniae NOT DETECTED NOT DETECTED Final   Streptococcus pyogenes NOT DETECTED NOT DETECTED Final   A.calcoaceticus-baumannii NOT DETECTED NOT DETECTED Final   Bacteroides fragilis NOT DETECTED NOT DETECTED Final   Enterobacterales NOT DETECTED NOT DETECTED Final   Enterobacter cloacae complex NOT DETECTED NOT DETECTED Final   Escherichia coli NOT  DETECTED NOT DETECTED Final   Klebsiella aerogenes NOT DETECTED NOT DETECTED Final   Klebsiella oxytoca NOT DETECTED NOT DETECTED Final   Klebsiella pneumoniae NOT DETECTED NOT DETECTED Final   Proteus species NOT DETECTED NOT DETECTED Final   Salmonella species NOT DETECTED NOT DETECTED Final   Serratia marcescens NOT DETECTED NOT DETECTED Final   Haemophilus influenzae NOT DETECTED NOT DETECTED Final   Neisseria meningitidis NOT DETECTED NOT DETECTED Final   Pseudomonas aeruginosa NOT DETECTED NOT DETECTED Final   Stenotrophomonas maltophilia NOT DETECTED NOT DETECTED Final   Candida albicans NOT DETECTED NOT DETECTED Final   Candida auris NOT DETECTED NOT DETECTED Final   Candida glabrata NOT DETECTED NOT DETECTED Final   Candida krusei NOT DETECTED NOT DETECTED Final   Candida parapsilosis NOT DETECTED NOT DETECTED Final   Candida tropicalis NOT DETECTED NOT DETECTED Final   Cryptococcus neoformans/gattii NOT DETECTED NOT DETECTED Final    Comment: Performed at [facility], [address], [place], [place] [address]   Culture, blood (Routine X 2) w Reflex to ID Panel     Status: None (Preliminary result)   Collection Time: 12/13/23  3:35 PM   Specimen: BLOOD RIGHT HAND  Result Value Ref Range Status   Specimen Description   Final    BLOOD RIGHT HAND Performed at [facility], [address] [place], [place] [address]    Special Requests   Final    BOTTLES DRAWN AEROBIC AND ANAEROBIC Blood Culture adequate volume Performed at [facility], [address] [place], [place] [address]    Culture   Final    NO GROWTH 4 DAYS Performed at [facility], [address], [place], [place] [address]    Report Status PENDING  Incomplete  Urine Culture (for pregnant, neutropenic or urologic patients or patients with an indwelling urinary catheter)     Status: Abnormal   Collection Time: 12/13/23  5:30 PM   Specimen: Urine, Clean Catch  Result Value Ref Range Status   Specimen Description   Final    URINE, CLEAN CATCH Performed at [facility], [address], [place], [place] [address]    Special Requests   Final    NONE Performed at [facility], [address], [place], [place] [address]    Culture >=100,000 COLONIES/mL ENTEROCOCCUS FAECALIS (A)  Final   Report Status 12/15/2023 FINAL  Final   Organism ID, Bacteria ENTEROCOCCUS FAECALIS (A)  Final      Susceptibility   Enterococcus faecalis - MIC*    AMPICILLIN  <=2 SENSITIVE Sensitive     NITROFURANTOIN <=16 SENSITIVE Sensitive     VANCOMYCIN  1 SENSITIVE Sensitive     * >=100,000 COLONIES/mL ENTEROCOCCUS FAECALIS         Radiology Studies: No results found.       Scheduled Meds:  acetaminophen   1,000 mg Oral TID   apixaban   5 mg Per Tube BID   Chlorhexidine  Gluconate Cloth  6 each Topical Q2200   FLUoxetine   20 mg Per Tube Daily   fluticasone   1 spray Each Nare Daily   metoprolol  tartrate  5 mg Intravenous Q8H   pantoprazole  (PROTONIX ) IV  40 mg Intravenous Q12H    polyethylene glycol  17 g Oral TID   sodium chloride  flush  10-40 mL Intracatheter Q12H   sodium chloride  flush  10-40 mL Intracatheter Q12H   Continuous Infusions:  ampicillin -sulbactam (UNASYN ) IV 3 g (12/17/23 0953)  feeding supplement (OSMOLITE 1.2 CAL) 1,000 mL (12/17/23 1011)   fluconazole  (DIFLUCAN ) IV 400 mg (12/16/23 1519)   TPN ADULT (ION) 100 mL/hr at 12/16/23 1810   TPN ADULT (ION)       LOS: 34 days    Time spent: 48 minutes spent on 12/17/2023 caring for this patient face-to-face including chart review, ordering labs/tests, documenting, discussion with nursing staff, consultants, updating family and interview/physical exam    Camellia PARAS [place], DO [facility] Available via Epic secure chat 7am-7pm After these hours, please refer to coverage provider listed on [email or web address] 12/17/2023, 12:20 PM

## 2023-12-17 NOTE — Anesthesia Postprocedure Evaluation (Signed)
 Anesthesia Post Note  Patient: David Mcgrath  Procedure(s) Performed: INSERTION, GASTROSTOMY/JEJUNOSTOMY TUBE, PERCUTANEOUS     Patient location during evaluation: PACU Anesthesia Type: General Level of consciousness: awake and alert Pain management: pain level controlled Vital Signs Assessment: post-procedure vital signs reviewed and stable Respiratory status: spontaneous breathing, nonlabored ventilation, respiratory function stable and patient connected to nasal cannula oxygen Cardiovascular status: blood pressure returned to baseline and stable Postop Assessment: no apparent nausea or vomiting Anesthetic complications: no   No notable events documented.  Last Vitals:  Vitals:   12/17/23 1246 12/17/23 1305  BP:  (!) 109/56  Pulse: 83 64  Resp:  16  Temp:  36.8 C  SpO2: 97% 100%    Last Pain:  Vitals:   12/17/23 1328  TempSrc:   PainSc: Asleep                 Fatisha Rabalais

## 2023-12-18 ENCOUNTER — Encounter (HOSPITAL_COMMUNITY): Payer: Self-pay | Admitting: General Surgery

## 2023-12-18 DIAGNOSIS — K567 Ileus, unspecified: Secondary | ICD-10-CM | POA: Diagnosis not present

## 2023-12-18 LAB — CBC
HCT: 28 % — ABNORMAL LOW (ref 39.0–52.0)
Hemoglobin: 8.9 g/dL — ABNORMAL LOW (ref 13.0–17.0)
MCH: 31.1 pg (ref 26.0–34.0)
MCHC: 31.8 g/dL (ref 30.0–36.0)
MCV: 97.9 fL (ref 80.0–100.0)
Platelets: 319 K/uL (ref 150–400)
RBC: 2.86 MIL/uL — ABNORMAL LOW (ref 4.22–5.81)
RDW: 16.1 % — ABNORMAL HIGH (ref 11.5–15.5)
WBC: 7.2 K/uL (ref 4.0–10.5)
nRBC: 0 % (ref 0.0–0.2)

## 2023-12-18 LAB — GLUCOSE, CAPILLARY
Glucose-Capillary: 120 mg/dL — ABNORMAL HIGH (ref 70–99)
Glucose-Capillary: 118 mg/dL — ABNORMAL HIGH (ref 70–99)
Glucose-Capillary: 94 mg/dL (ref 70–99)
Glucose-Capillary: 116 mg/dL — ABNORMAL HIGH (ref 70–99)
Glucose-Capillary: 115 mg/dL — ABNORMAL HIGH (ref 70–99)

## 2023-12-18 LAB — COMPREHENSIVE METABOLIC PANEL WITH GFR
ALT: 94 U/L — ABNORMAL HIGH (ref 0–44)
AST: 67 U/L — ABNORMAL HIGH (ref 15–41)
Albumin: 2.6 g/dL — ABNORMAL LOW (ref 3.5–5.0)
Alkaline Phosphatase: 149 U/L — ABNORMAL HIGH (ref 38–126)
Anion gap: 8 (ref 5–15)
BUN: 25 mg/dL — ABNORMAL HIGH (ref 8–23)
CO2: 26 mmol/L (ref 22–32)
Calcium: 8.4 mg/dL — ABNORMAL LOW (ref 8.9–10.3)
Chloride: 102 mmol/L (ref 98–111)
Creatinine, Ser: 0.42 mg/dL — ABNORMAL LOW (ref 0.61–1.24)
GFR, Estimated: >60 mL/min (ref >60)
Glucose, Bld: 113 mg/dL — ABNORMAL HIGH (ref 70–99)
Potassium: 3.8 mmol/L (ref 3.5–5.1)
Sodium: 136 mmol/L (ref 135–145)
Total Bilirubin: 0.3 mg/dL (ref 0.0–1.2)
Total Protein: 5.7 g/dL — ABNORMAL LOW (ref 6.5–8.1)

## 2023-12-18 LAB — CULTURE, BLOOD (ROUTINE X 2)
Culture: NO GROWTH
Special Requests: ADEQUATE

## 2023-12-18 LAB — TRIGLYCERIDES: Triglycerides: 75 mg/dL (ref <150)

## 2023-12-18 LAB — MAGNESIUM: Magnesium: 1.7 mg/dL (ref 1.7–2.4)

## 2023-12-18 LAB — PHOSPHORUS: Phosphorus: 2.4 mg/dL — ABNORMAL LOW (ref 2.5–4.6)

## 2023-12-18 MED ORDER — OSMOLITE 1.5 CAL PO LIQD
1000.0000 mL | ORAL | Status: DC
Start: 2023-12-18 — End: 2023-12-19
  Administered 2023-12-19: 1000 mL
  Filled 2023-12-18: qty 1000

## 2023-12-18 MED ORDER — TRAVASOL 10 % IV SOLN
INTRAVENOUS | Status: AC
  Filled 2023-12-18: qty 660

## 2023-12-18 MED ORDER — OSMOLITE 1.5 CAL PO LIQD
1000.0000 mL | ORAL | Status: DC
  Filled 2023-12-18: qty 1000

## 2023-12-18 MED ORDER — ADULT MULTIVITAMIN W/MINERALS CH: 1.0000 | ORAL_TABLET | Freq: Every day | ORAL | Status: DC

## 2023-12-18 NOTE — TOC Progression Note (Signed)
 Transition of Care [facility]) - Progression Note    Patient Details  Name: David Mcgrath MRN: [id number] Date of Birth: Aug 19, 1950  [facility] of [facility] CM/SW Contact  Doneta Glenys DASEN, RN Phone Number: 12/18/2023, 4:34 PM  Clinical Narrative:    Patient not medically ready NG tube with TPN for nutrition are active.   Expected Discharge Plan: Skilled Nursing Facility Barriers to Discharge: Continued Medical Work up               Expected Discharge Plan and Services In-house Referral: NA Discharge Planning Services: CM Consult Post Acute Care Choice: Skilled Nursing Facility Living arrangements for the past 2 months: Single Family Home                 DME Arranged: N/A DME Agency: NA       HH Arranged: NA HH Agency: NA         Social Drivers of Health (SDOH) Interventions SDOH Screenings   Food Insecurity: No Food Insecurity (11/13/2023)  Housing: Low Risk  (11/13/2023)  Transportation Needs: No Transportation Needs (11/13/2023)  Utilities: Not At Risk (11/13/2023)  Depression (PHQ2-9): Low Risk  (11/09/2023)  Social Connections: Socially Integrated (11/13/2023)  Tobacco Use: Low Risk  (12/15/2023)    Readmission Risk Interventions    11/05/2023   10:31 AM 10/15/2023   11:45 AM  Readmission Risk Prevention Plan  Transportation Screening Complete Complete  PCP or Specialist Appt within 3-5 Days Complete Complete  HRI or Home Care Consult Complete Complete  Social Work Consult for Recovery Care Planning/Counseling Complete Complete  Palliative Care Screening Not Applicable Not Applicable  Medication Review Oceanographer) Complete Complete

## 2023-12-18 NOTE — Progress Notes (Signed)
 PT Cancellation Note  Patient Details Name: David Mcgrath MRN: [id number] DOB: 29-Sep-1950   Cancelled Treatment:    Reason Eval/Treat Not Completed: Medical issues which prohibited therapy Attempted PT x 2, was in recliner  in Am, visiting, now requires nursing attendance for BM. Will check back tomorrow.  Darice Potters PT Acute Rehabilitation Services Office [phone]   Potters Darice Norris 12/18/2023, 3:33 PM

## 2023-12-18 NOTE — Progress Notes (Signed)
 PROGRESS NOTE    David Mcgrath  FMW:[id number] DOB: [birth year] DOA: 11/12/2023 PCP: David Skates, DO    Brief Narrative:   David Mcgrath is a 73 y.o. male with past medical history significant for HTN, HLD, paroxysmal atrial fibrillation on Eliquis , anxiety/depression, multiple myeloma (diagnosed March 2025) and received Bortezomib  infusion 11/09/2023, polyneuropathy who presented to [facility] ED on 11/12/2023 with worsening abdominal pain/distention.  Evaluation in the ED with imaging noted to have persistent ileus.  General surgery and GI consulted, underwent upper GI series and small bowel follow-through with confirm ration of delayed gastric emptying; although notable contrast in the rectum confirms not complete obstruction.  Additionally underwent endoscopy and exploratory laparoscopy with no clear findings of radiology for his symptoms.  Remains on low intermittent suction via NG tube with TPN for nutrition.  Initial plan for possible IR placement of GJ tube, unable to be completed due to poor windows.  Assessment & Plan:   Enterococcus faecalis UTI Patient with elevated temperature of 100.7 F, tachycardic, tachypneic on 12/13/2023.  Complicated by TPN use with PICC line in place.  Repeat labs with WBC count 6.0, lactic acid 0.8.  Procalcitonin 0.12. -- Urine culture: > 100K Enterococcus faecalis -- Blood cultures x 2: 1 out of 4 (aerobic bottle only) + MICROCOCCUS LUTEUS/LYLAE; suspect contaminant -- Continue antibiotics with Unasyn  3 g IV every 6 hours; plan 7-day course  Ileus, prolonged Partial small bowel obstruction Severe constipation Patient returning to the ED with worsening abdominal pain, distention.  Imaging notable for continued ileus, small bowel obstruction unlikely given contrast continues to the colon with large stool burden noted.  Patient underwent EGD, exploratory laparoscopy without any clear findings of supine imaging or symptoms.  IR unable to place  GJ tube due to poor windows.  Patient did not tolerate erythromycin  trial.  Avoid neostigmine per GI, also patient did not tolerate Reglan  due to side effect (tardive dyskinesia).  Had acute onset abdominal pain on 9/6 with large bowel movement after Linzess  administration.  General surgery was reconsulted and patient underwent laparoscopic jejunostomy and gastrostomy tube placement by Dr. Stevie on 12/15/2023. -- GI/general surgery following, appreciate assistance -- Continue TPN; hopeful to wean off soon -- Compazine /Zofran  as needed nausea/vomiting -- Lactulose  enema twice daily as needed severe constipation -- Continue gastric tube to gravity; tube feeds via J-tube increased to 30 mL/h today; then plan increase 10 mL q12h to goal as tolerates  Severe protein calorie malnutrition Body mass index is 22.68 kg/m.  Nutrition Status: Nutrition Problem: Severe Malnutrition Etiology: acute illness (recurrent small bowel obstruction) Signs/Symptoms: severe fat depletion, severe muscle depletion, energy intake < or equal to 50% for > or equal to 5 days, percent weight loss (22% in 1 month) Percent weight loss: 22 % (in 1 month) Interventions: Refer to RD note for recommendations, TPN -- Continue TPN  Normocytic anemia Anemia panel with iron 17, TIBC 185, ferritin 196, folate 8.2, vitamin B12 984.  Patient transfused 2 unit PRBCs on 12/05/2023. -- Hgb 12.6>>>>7.1>9.4>10.4>9.3>9.1>8.9 -- CBC in the am  Hypokalemia Hypomagnesemia Repleted.  Currently on TPN, uptitrating tube feeds  Multiple myeloma Follows with medical oncology outpatient, Dr. Federico.    HTN Paroxysmal atrial fibrillation Not on rate controlling/antihypertensives at baseline.  On Eliquis  at baseline. -- Restart Eliquis  today  HLD Holding home Zetia , pravastatin     Anxiety/depression -- Restart Prozac  today   Peripheral neuropathy Holding home gabapentin    GERD: -- Protonix  40 mg IV every 12h  Moderate protein  calorie malnutrition Nutrition Status: Nutrition Problem: Severe Malnutrition Etiology: acute illness (recurrent small bowel obstruction) Signs/Symptoms: severe fat depletion, severe muscle depletion, energy intake < or equal to 50% for > or equal to 5 days, percent weight loss (22% in 1 month) Percent weight loss: 22 % (in 1 month) Interventions: Refer to RD note for recommendations, TPN    DVT prophylaxis: Place and maintain sequential compression device Start: 11/16/23 1142 apixaban  (ELIQUIS ) tablet 5 mg    Code Status: Full Code Family Communication: Spouse present at bedside this morning  Disposition Plan:  Level of care: Med-Surg Status is: Inpatient Remains inpatient appropriate because: On TPN; uptitrating tube feeds, will need SNF placement eventually    Consultants:  [facility] General surgery IR  Procedures:  Small bowel enteroscopy, Dr. Rosalie 8/28 venting gastrostomy, feeding jejunostomy placement by general surgery; Dr. Stevie 9/12  Antimicrobials:  Vancomycin  9/10 - 9/13 Cefepime  9/10 - 9/12 Fluconazole  9/10 - 9/15 Unasyn  9/13>>   Subjective: Patient seen examined bedside, lying in bed.  Spouse present at bedside.  Seen by general surgery this morning okay to restart anticoagulation with Eliquis  and tube feeds at 20 mL/h today.  No other complaints or concerns at this time.. Patient denies headache, no chest pain, no shortness of breath, no abdominal pain, no current nausea/vomiting/diarrhea.  No acute events overnight per nursing staff.  Objective: Vitals:   12/17/23 1305 12/17/23 2019 12/18/23 0500 12/18/23 0500  BP: (!) 109/56 (!) 117/59  (!) 98/55  Pulse: 64 81  81  Resp: 16 18  18   Temp: 98.3 F (36.8 C) 97.7 F (36.5 C)  97.6 F (36.4 C)  TempSrc: Oral Oral  Oral  SpO2: 100% 98%  100%  Weight:   84.5 kg   Height:        Intake/Output Summary (Last 24 hours) at 12/18/2023 1146 Last data filed at 12/18/2023 0600 Gross per 24 hour  Intake  3628.43 ml  Output 1800 ml  Net 1828.43 ml   Filed Weights   12/16/23 0500 12/17/23 0500 12/18/23 0500  Weight: 82.9 kg 83.3 kg 84.5 kg    Examination:  Physical Exam: GEN: NAD, alert and oriented x 3, thin, chronically ill/frail in appearance HEENT: NCAT, PERRL, EOMI, sclera clear, MMM PULM: CTAB w/o wheezes/crackles, normal respiratory effort, on room air CV: RRR w/o M/G/R GI: abd soft, NTND, + BS; noted gastrostomy to gravity and jejunostomy tube in place MSK: no peripheral edema, moves all extremities independently NEURO: No focal neurological deficit PSYCH: Depressed mood, flat affect Integumentary: dry/intact, no rashes or wounds    Data Reviewed: I have personally reviewed following labs and imaging studies  CBC: Recent Labs  Lab 12/14/23 0510 12/15/23 0500 12/16/23 0535 12/17/23 0530 12/18/23 0520  WBC 7.4 9.8 10.1 8.1 7.2  HGB 10.2* 10.4* 9.3* 9.1* 8.9*  HCT 33.7* 33.1* 30.9* 29.5* 28.0*  MCV 98.5 99.1 99.0 98.3 97.9  PLT 364 355 318 322 319   Basic Metabolic Panel: Recent Labs  Lab 12/14/23 0510 12/15/23 0500 12/16/23 0535 12/17/23 0530 12/18/23 0520  NA 136 137 136 136 136  K 4.6 4.7 4.8 4.0 3.8  CL 107 108 107 103 102  CO2 18* 20* 18* 23 26  GLUCOSE 115* 116* 137* 103* 113*  BUN 40* 33* 31* 24* 25*  CREATININE 0.64 0.54* 0.53* 0.42* 0.42*  CALCIUM  8.9 8.9 8.9 8.8* 8.4*  MG 2.0  --   --   --  1.7  PHOS 3.6  --   --   --  2.4*   GFR: Estimated Creatinine Clearance: 99.8 mL/min (A) (by C-G formula based on SCr of 0.42 mg/dL (L)). Liver Function Tests: Recent Labs  Lab 12/14/23 0510 12/15/23 0500 12/16/23 0535 12/17/23 0530 12/18/23 0520  AST 47* 41 31 37 67*  ALT 44 43 38 39 94*  ALKPHOS 139* 140* 120 127* 149*  BILITOT 0.4 0.4 0.3 0.3 0.3  PROT 6.5 6.5 6.5 6.4* 5.7*  ALBUMIN  2.8* 2.8* 2.9* 2.9* 2.6*   No results for input(s): LIPASE, AMYLASE in the last 168 hours. No results for input(s): AMMONIA in the last 168  hours. Coagulation Profile: No results for input(s): INR, PROTIME in the last 168 hours. Cardiac Enzymes: No results for input(s): CKTOTAL, CKMB, CKMBINDEX, TROPONINI in the last 168 hours. BNP (last 3 results) No results for input(s): PROBNP in the last 8760 hours. HbA1C: No results for input(s): HGBA1C in the last 72 hours. CBG: Recent Labs  Lab 12/17/23 1624 12/17/23 2022 12/17/23 2353 12/18/23 0502 12/18/23 0819  GLUCAP 100* 129* 104* 120* 118*   Lipid Profile: No results for input(s): CHOL, HDL, LDLCALC, TRIG, CHOLHDL, LDLDIRECT in the last 72 hours.  Thyroid  Function Tests: No results for input(s): TSH, T4TOTAL, FREET4, T3FREE, THYROIDAB in the last 72 hours. Anemia Panel: No results for input(s): VITAMINB12, FOLATE, FERRITIN, TIBC, IRON, RETICCTPCT in the last 72 hours. Sepsis Labs: Recent Labs  Lab 12/13/23 1429 12/14/23 0510  PROCALCITON  --  0.12  LATICACIDVEN 0.8  --     Recent Results (from the past 240 hours)  Culture, blood (Routine X 2) w Reflex to ID Panel     Status: Abnormal   Collection Time: 12/13/23  3:33 PM   Specimen: BLOOD RIGHT ARM  Result Value Ref Range Status   Specimen Description   Final    BLOOD RIGHT ARM Performed at [facility], [address] [place], [place] [address]    Special Requests   Final    BOTTLES DRAWN AEROBIC AND ANAEROBIC Blood Culture adequate volume Performed at [facility], [address] [place], [place] [address]    Culture  Setup Time   Final    GRAM POSITIVE COCCI IN CLUSTERS AEROBIC BOTTLE ONLY CRITICAL RESULT CALLED TO, READ BACK BY AND VERIFIED WITH: PHARMD MICHELLE L [id number] FCP    Culture (A)  Final    MICROCOCCUS LUTEUS/LYLAE Standardized susceptibility testing for this organism is not available. Performed at [facility], [address], [place], [place] [address]    Report Status 12/17/2023 FINAL  Final  Blood  Culture ID Panel (Reflexed)     Status: None   Collection Time: 12/13/23  3:33 PM  Result Value Ref Range Status   Enterococcus faecalis NOT DETECTED NOT DETECTED Final   Enterococcus Faecium NOT DETECTED NOT DETECTED Final   Listeria monocytogenes NOT DETECTED NOT DETECTED Final   Staphylococcus species NOT DETECTED NOT DETECTED Final   Staphylococcus aureus (BCID) NOT DETECTED NOT DETECTED Final   Staphylococcus epidermidis NOT DETECTED NOT DETECTED Final   Staphylococcus lugdunensis NOT DETECTED NOT DETECTED Final   Streptococcus species NOT DETECTED NOT DETECTED Final   Streptococcus agalactiae NOT DETECTED NOT DETECTED Final   Streptococcus pneumoniae NOT DETECTED NOT DETECTED Final   Streptococcus pyogenes NOT DETECTED NOT DETECTED Final   A.calcoaceticus-baumannii NOT DETECTED NOT DETECTED Final   Bacteroides fragilis NOT DETECTED NOT DETECTED Final   Enterobacterales NOT DETECTED NOT DETECTED Final   Enterobacter cloacae complex NOT DETECTED NOT  DETECTED Final   Escherichia coli NOT DETECTED NOT DETECTED Final   Klebsiella aerogenes NOT DETECTED NOT DETECTED Final   Klebsiella oxytoca NOT DETECTED NOT DETECTED Final   Klebsiella pneumoniae NOT DETECTED NOT DETECTED Final   Proteus species NOT DETECTED NOT DETECTED Final   Salmonella species NOT DETECTED NOT DETECTED Final   Serratia marcescens NOT DETECTED NOT DETECTED Final   Haemophilus influenzae NOT DETECTED NOT DETECTED Final   Neisseria meningitidis NOT DETECTED NOT DETECTED Final   Pseudomonas aeruginosa NOT DETECTED NOT DETECTED Final   Stenotrophomonas maltophilia NOT DETECTED NOT DETECTED Final   Candida albicans NOT DETECTED NOT DETECTED Final   Candida auris NOT DETECTED NOT DETECTED Final   Candida glabrata NOT DETECTED NOT DETECTED Final   Candida krusei NOT DETECTED NOT DETECTED Final   Candida parapsilosis NOT DETECTED NOT DETECTED Final   Candida tropicalis NOT DETECTED NOT DETECTED Final   Cryptococcus  neoformans/gattii NOT DETECTED NOT DETECTED Final    Comment: Performed at [facility], [address], [place], [place] [address]  Culture, blood (Routine X 2) w Reflex to ID Panel     Status: None   Collection Time: 12/13/23  3:35 PM   Specimen: BLOOD RIGHT HAND  Result Value Ref Range Status   Specimen Description   Final    BLOOD RIGHT HAND Performed at [facility], [address], [place], [place] [address]    Special Requests   Final    BOTTLES DRAWN AEROBIC AND ANAEROBIC Blood Culture adequate volume Performed at [facility], [address], [place], [place] [address]    Culture   Final    NO GROWTH 5 DAYS Performed at [facility], [address], [place], [place] [address]    Report Status 12/18/2023 FINAL  Final  Urine Culture (for pregnant, neutropenic or urologic patients or patients with an indwelling urinary catheter)     Status: Abnormal   Collection Time: 12/13/23  5:30 PM   Specimen: Urine, Clean Catch  Result Value Ref Range Status   Specimen Description   Final    URINE, CLEAN CATCH Performed at [facility], [address] [place], [place] [address]    Special Requests   Final    NONE Performed at [facility], [address] [place], [place] [address]    Culture >=100,000 COLONIES/mL ENTEROCOCCUS FAECALIS (A)  Final   Report Status 12/15/2023 FINAL  Final   Organism ID, Bacteria ENTEROCOCCUS FAECALIS (A)  Final      Susceptibility   Enterococcus faecalis - MIC*    AMPICILLIN  <=2 SENSITIVE Sensitive     NITROFURANTOIN <=16 SENSITIVE Sensitive     VANCOMYCIN  1 SENSITIVE Sensitive     * >=100,000 COLONIES/mL ENTEROCOCCUS FAECALIS         Radiology Studies: No results found.       Scheduled Meds:  acetaminophen   1,000 mg Oral TID   apixaban   5 mg Per Tube BID   Chlorhexidine  Gluconate Cloth  6 each Topical Q2200   FLUoxetine   20 mg Per Tube Daily   fluticasone   1  spray Each Nare Daily   metoprolol  tartrate  5 mg Intravenous Q8H   [START ON 12/19/2023] multivitamin with minerals  1 tablet Per Tube Daily   pantoprazole  (PROTONIX ) IV  40 mg Intravenous Q12H   polyethylene glycol  17 g Oral TID   sodium chloride  flush  10-40 mL Intracatheter Q12H  sodium chloride  flush  10-40 mL Intracatheter Q12H   Continuous Infusions:  ampicillin -sulbactam (UNASYN ) IV 3 g (12/18/23 0958)   feeding supplement (OSMOLITE 1.5 CAL)     fluconazole  (DIFLUCAN ) IV Stopped (12/17/23 1515)   TPN ADULT (ION) 100 mL/hr at 12/17/23 1710   TPN ADULT (ION)       LOS: 35 days    Time spent: 48 minutes spent on 12/18/2023 caring for this patient face-to-face including chart review, ordering labs/tests, documenting, discussion with nursing staff, consultants, updating family and interview/physical exam    Camellia PARAS [place], DO [facility] Available via Epic secure chat 7am-7pm After these hours, please refer to coverage provider listed on [email or web address] 12/18/2023, 11:46 AM

## 2023-12-18 NOTE — Progress Notes (Addendum)
 3 Days Post-Op   Subjective/Chief Complaint: Patient feels well today.  No bloating.  No nausea or vomiting. G tube to gravity with >1200 mL/24h. +BMs  Objective: Vital signs in last 24 hours: Temp:  [97.6 F (36.4 C)-98.3 F (36.8 C)] 97.6 F (36.4 C) (09/15 0500) Pulse Rate:  [64-83] 81 (09/15 0500) Resp:  [16-18] 18 (09/15 0500) BP: (98-117)/(55-59) 98/55 (09/15 0500) SpO2:  [97 %-100 %] 100 % (09/15 0500) Weight:  [84.5 kg] 84.5 kg (09/15 0500) Last BM Date : 12/15/23  Intake/Output from previous day: 09/14 0701 - 09/15 0700 In: 3658.4 [I.V.:2396.5; NG/GT:400; IV Piggyback:831.9] Out: 2150 [Urine:900; Drains:1250] Intake/Output this shift: No intake/output data recorded.  Abdomen: Incisions clean dry intact.  Gastrostomy tube and jejunostomy tube intact with no further bleeding.  Gastrostomy to gravity. Jtube with tube feeds running. Abdomen non-distended.  Lab Results:  Recent Labs    12/17/23 0530 12/18/23 0520  WBC 8.1 7.2  HGB 9.1* 8.9*  HCT 29.5* 28.0*  PLT 322 319   BMET Recent Labs    12/17/23 0530 12/18/23 0520  NA 136 136  K 4.0 3.8  CL 103 102  CO2 23 26  GLUCOSE 103* 113*  BUN 24* 25*  CREATININE 0.42* 0.42*  CALCIUM  8.8* 8.4*   PT/INR No results for input(s): LABPROT, INR in the last 72 hours. ABG No results for input(s): PHART, HCO3 in the last 72 hours.  Invalid input(s): PCO2, PO2  Studies/Results: No results found.  Anti-infectives: Anti-infectives (From admission, onward)    Start     Dose/Rate Route Frequency Ordered Stop   12/16/23 1600  Ampicillin -Sulbactam (UNASYN ) 3 g in sodium chloride  0.9 % 100 mL IVPB        3 g 200 mL/hr over 30 Minutes Intravenous Every 6 hours 12/16/23 1453     12/14/23 1400  fluconazole  (DIFLUCAN ) IVPB 400 mg        400 mg 100 mL/hr over 120 Minutes Intravenous Every 24 hours 12/13/23 1536     12/13/23 2200  vancomycin  (VANCOCIN ) IVPB 1000 mg/200 mL premix  Status:  Discontinued         1,000 mg 200 mL/hr over 60 Minutes Intravenous 2 times daily 12/13/23 1536 12/16/23 1453   12/13/23 1630  fluconazole  (DIFLUCAN ) IVPB 800 mg  Status:  Discontinued        800 mg 200 mL/hr over 120 Minutes Intravenous  Once 12/13/23 1536 12/13/23 1558   12/13/23 1630  ceFEPIme  (MAXIPIME ) 2 g in sodium chloride  0.9 % 100 mL IVPB  Status:  Discontinued        2 g 200 mL/hr over 30 Minutes Intravenous Every 8 hours 12/13/23 1536 12/16/23 1453   12/13/23 1630  vancomycin  (VANCOCIN ) IVPB 1000 mg/200 mL premix        1,000 mg 200 mL/hr over 60 Minutes Intravenous  Once 12/13/23 1536 12/13/23 1707   12/13/23 1630  fluconazole  (DIFLUCAN ) IVPB 400 mg        400 mg 100 mL/hr over 120 Minutes Intravenous Every 1 hr x 2 12/13/23 1558 12/13/23 1909   11/27/23 1200  erythromycin  500 mg in sodium chloride  0.9 % 100 mL IVPB        500 mg 100 mL/hr over 60 Minutes Intravenous Every 6 hours 11/27/23 0933 11/29/23 0723   11/24/23 1400  erythromycin  500 mg in sodium chloride  0.9 % 100 mL IVPB  Status:  Discontinued        500 mg 100 mL/hr over 60 Minutes  Intravenous Every 8 hours 11/24/23 1027 11/27/23 0933   11/23/23 2000  erythromycin  250 mg in sodium chloride  0.9 % 100 mL IVPB  Status:  Discontinued        250 mg 100 mL/hr over 60 Minutes Intravenous Every 8 hours 11/23/23 1744 11/24/23 1027   11/23/23 1200  erythromycin  (E-MYCIN ) tablet 250 mg  Status:  Discontinued        250 mg Oral 3 times daily before meals 11/23/23 1004 11/23/23 1744   11/18/23 1200  erythromycin  (E-MYCIN ) tablet 250 mg  Status:  Discontinued        250 mg Oral 2 times daily with meals 11/18/23 0720 11/19/23 0734   11/13/23 1000  acyclovir  (ZOVIRAX ) tablet 400 mg  Status:  Discontinued        400 mg Oral 2 times daily 11/13/23 0503 11/19/23 0739       Assessment/Plan: s/p Procedure(s) with comments: INSERTION, GASTROSTOMY/JEJUNOSTOMY TUBE, PERCUTANEOUS (N/A) - PEG placement with Endo, Laparoscopic J-Tube  S/p diagnostic  laparoscopy by Dr. [name] on 11/03/23  - Intra-op: Small bowel appeared intermittently dilated and injected consistent with inflammation, small amount of clear ascites, mesenteric edema and injection.  No obstruction present however he does have a very long, mobile small bowel mesentery concerning for intermittent volvulus  - Small bowel follow through on 8/22 with no passage of contrast from stomach to small bowel was noted within the first 3 hours of the exam despite positioning patient to encourage gastric emptying. Patient was returned to the floor and KUB images at 4 hrs, 8 hrs, and 24 hrs after contrast administration were done. Delay films with contrast in the rectum with gaseous distention of small bowel with gas-filled colon. Bx results reviewed.  - He has had trial of erythromycin  and reported as intolerant to Reglan  (hx of Tardive Dyskinesia) - Small bowel endoscopy 8/28 by GI w/ small HH, mild inflammation characterized by erosions from NG tube trauma was found in the gastric body, no evidence of significant pathology in the entire examined duodenum and no evidence of significant pathology at 60 cm ( distal to the pylorus). Biopsy without clear etiology to explain patients symptoms.  - CT 9/2 w/ Small-bowel obstruction with transition point in the mid abdomen. Large amount of stool in the rectum with rectal wall thickening and presacral edema. 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 - IR consulted 9/3 for possible venting G-tube placement and IR felt pt's anatomy is not favorable for percutaneous G tube placement - GI has patient on bowel regimen on TID miralax  with PRN lactulose  enema. He had Linzess  earlier in the admission. GI was looking into possible Motegrity; consider decreasing miralax  if develops worsening diarrhea with TF.     FEN -increase tube feeds to 30 cc an hour, then increase q 8h to goal; ok to wean TNA to half today or tomorrow from a surgical perspective    VTE -  SCDs, therapeutic Lovenox  ID - Cefepime /Vanc/Fluconazole  per primary      LOS: 35 days    Almarie GORMAN Pringle PA-C 12/18/2023

## 2023-12-18 NOTE — Progress Notes (Signed)
 PHARMACY - TOTAL PARENTERAL NUTRITION CONSULT NOTE   Indication: Prolonged ileus  Patient Measurements: Height: 6' 4 (193 cm) Weight: 84.5 kg (186 lb 4.6 oz) IBW/kg (Calculated) : 86.8 TPN AdjBW (KG): 94.6 Body mass index is 22.68 kg/m.  Assessment:  Pharmacy is consulted to start TPN on 73 yo male diagnosed with bowel obstruction. This admission CT abdomen shows increasing small bowel dilatation and fecalization of bowel contents involving the jejunum. No definitive transition point is identified at this time but caliber change is noted in the right mid abdomen at the junction of the jejunum and ileum. The more distal ileum is unremarkable. Some suspicion that bowel obstruction may be related to bortezomib  treatment pt receiving for multiple myeloma.   Glucose / Insulin : no hx of DM. Goal CBG 140-180. Currently not on insulin   Electrolytes: phos slightly low at 2.4, Mg 1.7; all others WNL including corrCa 9.5 Renal: WNL Hepatic: AST/ALT slight bump, AlkPhos trending up, alb 2.6; TG stable Intake / Output; MIVF:  -UOP adequate -G tube to gravity with > 1 L output -LBM 9/12 GI Imaging: -Admitted from 10/14/23-10/20/23 for symptoms related to SBO.  CT: Transition point suspected in the right hemi-abdomen. Received non-operative management.  -Admitted 11/02/23 - 11/05/2023 for small bowel dilatation/concern for small bowel obstruction. CT imaging showed persistent small bowel dilatation with mild transition point noted deep in pelvis. He went to OR 11/03/23, findings were suspicious for intermittent volvulus of small bowel to explain his symptoms and CT imaging.  -8/10  CT abdomen shows increasing small bowel dilatation and fecalization of bowel contents involving the jejunum. No definitive transition point -9/4 Abd XR: likely improving ileus -9/6 Abd XR: concerning for ileus or possibly obstruction -9/8 Abd XR: slightly increased small bowel distension compared to most recent study, most  consistent w/ an ileus -9/10 Abd XR: interval worsening of small bowel dilatation -9/11 Abd XR: persistent distended small-bowel loops, with a loop in the left hemiabdomen measuring up to 5.2 cm, consistent with ileus -9/11 CT a/p: Mildly dilated fluid-filled small bowel loops into the pelvis. Several distal small bowel loops appear mildly thick walled. This could reflect infectious or inflammatory enteritis with small bowel ileus or some degree of resulting functional obstruction. Large bowel is decompressed. GI Surgeries / Procedures:  -8/1 diagnostic laparoscopy -8/28: EGD, enteroscopy - small hiatal hernia, gastritis, duodenum/jejunum appeared normal; several biopsies taken -9/12: venting gastrostomy and feeding jejunostomy placement  Central access:  PICC 8/18   Nutritional Goals: Goal TPN rate is 100 mL/hr (provides 132 g of protein and 2438 kcals per day)  RD Assessment:  Estimated Needs Total Energy Estimated Needs: 2400-2600 kcals Total Protein Estimated Needs: 120-135 grams Total Fluid Estimated Needs: >/= 2.4L  Current Nutrition:  Tube feeds - advance to 30 ml/hr today, then advance q12h to goal if tolerating  Plan:  Decrease TPN to 50 mL/hr at 1800 Electrolytes in TPN: Na 50 mEq/L  K 40 mEq/L Ca 2.5 mEq/L Mg 2.5 mEq/L Phos 10 mmol/L  Cl:Ac 1:1 Remove MVI/trace elements from TPN and give via tube Monitor TPN labs on Mon/Thurs Anticipate may be able to stop TPN next 24-48 hrs if tolerating continued advancement of tube feeds   Stefano MARLA Bologna, PharmD, BCPS Clinical Pharmacist 12/18/2023 10:47 AM

## 2023-12-18 NOTE — Progress Notes (Signed)
 Inpatient Rehab Admissions Coordinator:   Pt POD3 laparoscopic jtube insertion and endoscopic gtube placement.  On tube feeds and advancing as tolerated; TPN weaning today vs tomorrow.  Pt had a difficult week last week and unable to participate with therapy, though had been participating well up until last week.  Will see how he does with therapy for the next few days to see if he can tolerate intensity of CIR.   Reche Lowers, PT, DPT Admissions Coordinator [phone] 12/18/23  3:26 PM

## 2023-12-18 NOTE — Plan of Care (Addendum)
 Pt is alert and oriented x 4. Had bm this large bm this am. 900 out of gtube. Jtube with feedings infusing. TPN infusing in picc. Received 2 doses of morphine . 1 dose of zofran .  Problem: Education: Goal: Knowledge of General Education information will improve Description: Including pain rating scale, medication(s)/side effects and non-pharmacologic comfort measures Outcome: Progressing   Problem: Health Behavior/Discharge Planning: Goal: Ability to manage health-related needs will improve Outcome: Progressing   Problem: Clinical Measurements: Goal: Ability to maintain clinical measurements within normal limits will improve Outcome: Progressing Goal: Will remain free from infection Outcome: Progressing Goal: Diagnostic test results will improve Outcome: Progressing Goal: Respiratory complications will improve Outcome: Progressing Goal: Cardiovascular complication will be avoided Outcome: Progressing   Problem: Activity: Goal: Risk for activity intolerance will decrease Outcome: Progressing   Problem: Nutrition: Goal: Adequate nutrition will be maintained Outcome: Progressing   Problem: Coping: Goal: Level of anxiety will decrease Outcome: Progressing   Problem: Elimination: Goal: Will not experience complications related to bowel motility Outcome: Progressing Goal: Will not experience complications related to urinary retention Outcome: Progressing   Problem: Pain Managment: Goal: General experience of comfort will improve and/or be controlled Outcome: Progressing   Problem: Safety: Goal: Ability to remain free from injury will improve Outcome: Progressing   Problem: Skin Integrity: Goal: Risk for impaired skin integrity will decrease Outcome: Progressing   Problem: Nutrition Goal: Patient maintains adequate hydration Outcome: Progressing Goal: Patient maintains weight Outcome: Progressing   Problem: Coping: Goal: Ability to adjust to condition or change in  health will improve Outcome: Progressing   Problem: Fluid Volume: Goal: Ability to maintain a balanced intake and output will improve Outcome: Progressing   Problem: Health Behavior/Discharge Planning: Goal: Ability to identify and utilize available resources and services will improve Outcome: Progressing Goal: Ability to manage health-related needs will improve Outcome: Progressing   Problem: Nutritional: Goal: Maintenance of adequate nutrition will improve Outcome: Progressing Goal: Progress toward achieving an optimal weight will improve Outcome: Progressing   Problem: Skin Integrity: Goal: Risk for impaired skin integrity will decrease Outcome: Progressing   Problem: Tissue Perfusion: Goal: Adequacy of tissue perfusion will improve Outcome: Progressing

## 2023-12-19 DIAGNOSIS — K567 Ileus, unspecified: Secondary | ICD-10-CM | POA: Diagnosis not present

## 2023-12-19 LAB — GLUCOSE, CAPILLARY
Glucose-Capillary: 100 mg/dL — ABNORMAL HIGH (ref 70–99)
Glucose-Capillary: 112 mg/dL — ABNORMAL HIGH (ref 70–99)
Glucose-Capillary: 121 mg/dL — ABNORMAL HIGH (ref 70–99)
Glucose-Capillary: 85 mg/dL (ref 70–99)
Glucose-Capillary: 90 mg/dL (ref 70–99)
Glucose-Capillary: 94 mg/dL (ref 70–99)

## 2023-12-19 LAB — MRSA NEXT GEN BY PCR, NASAL: MRSA by PCR Next Gen: NOT DETECTED

## 2023-12-19 MED ORDER — OSMOLITE 1.5 CAL PO LIQD
1000.0000 mL | ORAL | Status: DC
  Filled 2023-12-19: qty 1000

## 2023-12-19 MED ORDER — ADULT MULTIVITAMIN W/MINERALS CH
1.0000 | ORAL_TABLET | Freq: Every day | ORAL | Status: DC
  Administered 2023-12-19 – 2023-12-20 (×2): 1 via ORAL
  Filled 2023-12-19 (×2): qty 1

## 2023-12-19 MED ORDER — METOPROLOL TARTRATE 25 MG PO TABS
12.5000 mg | ORAL_TABLET | Freq: Two times a day (BID) | ORAL | Status: DC
  Administered 2023-12-19 – 2023-12-20 (×2): 12.5 mg via ORAL
  Filled 2023-12-19 (×3): qty 1

## 2023-12-19 MED ORDER — FLUOXETINE HCL 20 MG PO CAPS
20.0000 mg | ORAL_CAPSULE | Freq: Every day | ORAL | Status: DC
Start: 2023-12-19 — End: 2023-12-21
  Administered 2023-12-19 – 2023-12-20 (×2): 20 mg via ORAL
  Filled 2023-12-19 (×3): qty 1

## 2023-12-19 MED ORDER — OSMOLITE 1.5 CAL PO LIQD
1000.0000 mL | ORAL | Status: DC
  Administered 2023-12-19 – 2023-12-20 (×2): 1000 mL
  Filled 2023-12-19 (×4): qty 1000

## 2023-12-19 MED ORDER — APIXABAN 5 MG PO TABS
5.0000 mg | ORAL_TABLET | Freq: Two times a day (BID) | ORAL | Status: DC
  Administered 2023-12-19 – 2023-12-20 (×3): 5 mg via ORAL
  Filled 2023-12-19 (×4): qty 1

## 2023-12-19 MED ORDER — PROSOURCE TF20 ENFIT COMPATIBL EN LIQD
60.0000 mL | Freq: Every day | ENTERAL | Status: DC
  Administered 2023-12-20: 60 mL
  Filled 2023-12-19: qty 60

## 2023-12-19 NOTE — Progress Notes (Signed)
 PROGRESS NOTE    Kavontae Pritchard Mcgrath  FMW:[id number] DOB: [birth year] DOA: 11/12/2023 PCP: [name], DO    Brief Narrative:   David Mcgrath is a 73 y.o. male with past medical history significant for HTN, HLD, paroxysmal atrial fibrillation on Eliquis , anxiety/depression, multiple myeloma (diagnosed March 2025) and received Bortezomib  infusion 11/09/2023, polyneuropathy who presented to [facility] ED on 11/12/2023 with worsening abdominal pain/distention.  Evaluation in the ED with imaging noted to have persistent ileus.  General surgery and GI consulted, underwent upper GI series and small bowel follow-through with confirm ration of delayed gastric emptying; although notable contrast in the rectum confirms not complete obstruction.  Additionally underwent endoscopy and exploratory laparoscopy with no clear findings of radiology for his symptoms.  Remains on low intermittent suction via NG tube with TPN for nutrition.  Initial plan for possible IR placement of GJ tube, unable to be completed due to poor windows.  Assessment & Plan:   Enterococcus faecalis UTI Patient with elevated temperature of 100.7 F, tachycardic, tachypneic on 12/13/2023.  Complicated by TPN use with PICC line in place.  Repeat labs with WBC count 6.0, lactic acid 0.8.  Procalcitonin 0.12. -- Urine culture: > 100K Enterococcus faecalis -- Blood cultures x 2: 1 out of 4 (aerobic bottle only) + MICROCOCCUS LUTEUS/LYLAE; suspect contaminant -- Continue antibiotics with Unasyn  3 g IV every 6 hours; plan 7-day course  Ileus, prolonged Partial small bowel obstruction Severe constipation Patient returning to the ED with worsening abdominal pain, distention.  Imaging notable for continued ileus, small bowel obstruction unlikely given contrast continues to the colon with large stool burden noted.  Patient underwent EGD, exploratory laparoscopy without any clear findings of supine imaging or symptoms.  IR unable to place  GJ tube due to poor windows.  Patient did not tolerate erythromycin  trial.  Avoid neostigmine per GI, also patient did not tolerate Reglan  due to side effect (tardive dyskinesia).  Had acute onset abdominal pain on 9/6 with large bowel movement after Linzess  administration.  General surgery was reconsulted and patient underwent laparoscopic jejunostomy and gastrostomy tube placement by Dr. Stevie on 12/15/2023. -- GI/general surgery following, appreciate assistance -- Continue TPN; plan to wean off today 9/16 -- Compazine /Zofran  as needed nausea/vomiting -- Lactulose  enema twice daily as needed severe constipation -- Continue gastric tube to gravity; tube feeds via J-tube increased to 50 mL/h today; (goal 70 mL/h)  Severe protein calorie malnutrition Body mass index is 23.32 kg/m.  Nutrition Status: Nutrition Problem: Severe Malnutrition Etiology: acute illness (recurrent small bowel obstruction) Signs/Symptoms: severe fat depletion, severe muscle depletion, energy intake < or equal to 50% for > or equal to 5 days, percent weight loss (22% in 1 month) Percent weight loss: 22 % (in 1 month) Interventions: Refer to RD note for recommendations, TPN -- Continue TPN; pharmacy to wean off today -- Started on tube feeds as above  Normocytic anemia Anemia panel with iron 17, TIBC 185, ferritin 196, folate 8.2, vitamin B12 984.  Patient transfused 2 unit PRBCs on 12/05/2023. -- Hgb 12.6>>>>7.1>9.4>10.4>9.3>9.1>8.9 -- CBC in the am  Hypokalemia Hypomagnesemia Repleted.  Currently on TPN, uptitrating tube feeds  Multiple myeloma Follows with medical oncology outpatient, Dr. Federico.    HTN Paroxysmal atrial fibrillation Not on rate controlling/antihypertensives at baseline.  On Eliquis  at baseline. -- Restarted on Eliquis   HLD Holding home Zetia , pravastatin     Anxiety/depression -- Continue Prozac    Peripheral neuropathy Holding home gabapentin    GERD: -- Protonix  40  mg IV every  12h   Moderate protein calorie malnutrition Nutrition Status: Nutrition Problem: Severe Malnutrition Etiology: acute illness (recurrent small bowel obstruction) Signs/Symptoms: severe fat depletion, severe muscle depletion, energy intake < or equal to 50% for > or equal to 5 days, percent weight loss (22% in 1 month) Percent weight loss: 22 % (in 1 month) Interventions: Refer to RD note for recommendations, TPN -- Wean off TPN, started on tube feeds advancing to goal   DVT prophylaxis: Place and maintain sequential compression device Start: 11/16/23 1142 apixaban  (ELIQUIS ) tablet 5 mg    Code Status: Full Code Family Communication: Spouse present at bedside this morning  Disposition Plan:  Level of care: Med-Surg Status is: Inpatient Remains inpatient appropriate because: Weaning TPN; uptitrating tube feeds, will need CAR/SNF placement eventually    Consultants:  [facility] General surgery IR  Procedures:  Small bowel enteroscopy, Dr. Rosalie 8/28 venting gastrostomy, feeding jejunostomy placement by general surgery; Dr. Stevie 9/12  Antimicrobials:  Vancomycin  9/10 - 9/13 Cefepime  9/10 - 9/12 Fluconazole  9/10 - 9/15 Unasyn  9/13>>   Subjective: Patient seen examined bedside, lying in bed.  Spouse present at bedside.  Tolerating tube feeds well.  Discussed with general surgery PA this morning, further advancing tube feeds today and weaning off of TPN.  Continuing to encourage increased mobilization.  No other complaints or concerns at this time.. Patient denies headache, no chest pain, no shortness of breath, no abdominal pain, no current nausea/vomiting/diarrhea.  No acute events overnight per nursing staff.  Objective: Vitals:   12/18/23 2011 12/18/23 2210 12/19/23 0422 12/19/23 0610  BP: 95/60 127/61 (!) 112/57 (!) 127/59  Pulse: 82 93 82 80  Resp: 20 19 16 17   Temp: 97.8 F (36.6 C) 97.7 F (36.5 C) 97.8 F (36.6 C) 97.7 F (36.5 C)  TempSrc: Oral Oral Oral Oral   SpO2: 100% 100% 100% 99%  Weight:    86.9 kg  Height:        Intake/Output Summary (Last 24 hours) at 12/19/2023 1135 Last data filed at 12/19/2023 0647 Gross per 24 hour  Intake 1246.63 ml  Output 1500 ml  Net -253.37 ml   Filed Weights   12/17/23 0500 12/18/23 0500 12/19/23 0610  Weight: 83.3 kg 84.5 kg 86.9 kg    Examination:  Physical Exam: GEN: NAD, alert and oriented x 3, thin, chronically ill/frail in appearance HEENT: NCAT, PERRL, EOMI, sclera clear, MMM PULM: CTAB w/o wheezes/crackles, normal respiratory effort, on room air CV: RRR w/o M/G/R GI: abd soft, NTND, + BS; noted gastrostomy to gravity and jejunostomy tube in place MSK: no peripheral edema, moves all extremities independently NEURO: No focal neurological deficit PSYCH: Depressed mood, flat affect Integumentary: dry/intact, no rashes or wounds    Data Reviewed: I have personally reviewed following labs and imaging studies  CBC: Recent Labs  Lab 12/14/23 0510 12/15/23 0500 12/16/23 0535 12/17/23 0530 12/18/23 0520  WBC 7.4 9.8 10.1 8.1 7.2  HGB 10.2* 10.4* 9.3* 9.1* 8.9*  HCT 33.7* 33.1* 30.9* 29.5* 28.0*  MCV 98.5 99.1 99.0 98.3 97.9  PLT 364 355 318 322 319   Basic Metabolic Panel: Recent Labs  Lab 12/14/23 0510 12/15/23 0500 12/16/23 0535 12/17/23 0530 12/18/23 0520  NA 136 137 136 136 136  K 4.6 4.7 4.8 4.0 3.8  CL 107 108 107 103 102  CO2 18* 20* 18* 23 26  GLUCOSE 115* 116* 137* 103* 113*  BUN 40* 33* 31* 24* 25*  CREATININE 0.64 0.54*  0.53* 0.42* 0.42*  CALCIUM  8.9 8.9 8.9 8.8* 8.4*  MG 2.0  --   --   --  1.7  PHOS 3.6  --   --   --  2.4*   GFR: Estimated Creatinine Clearance: 102.5 mL/min (A) (by C-G formula based on SCr of 0.42 mg/dL (L)). Liver Function Tests: Recent Labs  Lab 12/14/23 0510 12/15/23 0500 12/16/23 0535 12/17/23 0530 12/18/23 0520  AST 47* 41 31 37 67*  ALT 44 43 38 39 94*  ALKPHOS 139* 140* 120 127* 149*  BILITOT 0.4 0.4 0.3 0.3 0.3  PROT 6.5  6.5 6.5 6.4* 5.7*  ALBUMIN  2.8* 2.8* 2.9* 2.9* 2.6*   No results for input(s): LIPASE, AMYLASE in the last 168 hours. No results for input(s): AMMONIA in the last 168 hours. Coagulation Profile: No results for input(s): INR, PROTIME in the last 168 hours. Cardiac Enzymes: No results for input(s): CKTOTAL, CKMB, CKMBINDEX, TROPONINI in the last 168 hours. BNP (last 3 results) No results for input(s): PROBNP in the last 8760 hours. HbA1C: No results for input(s): HGBA1C in the last 72 hours. CBG: Recent Labs  Lab 12/18/23 1650 12/18/23 2008 12/19/23 0001 12/19/23 0418 12/19/23 0729  GLUCAP 116* 115* 94 121* 112*   Lipid Profile: Recent Labs    12/18/23 0520  TRIG 75    Thyroid  Function Tests: No results for input(s): TSH, T4TOTAL, FREET4, T3FREE, THYROIDAB in the last 72 hours. Anemia Panel: No results for input(s): VITAMINB12, FOLATE, FERRITIN, TIBC, IRON, RETICCTPCT in the last 72 hours. Sepsis Labs: Recent Labs  Lab 12/13/23 1429 12/14/23 0510  PROCALCITON  --  0.12  LATICACIDVEN 0.8  --     Recent Results (from the past 240 hours)  Culture, blood (Routine X 2) w Reflex to ID Panel     Status: Abnormal   Collection Time: 12/13/23  3:33 PM   Specimen: BLOOD RIGHT ARM  Result Value Ref Range Status   Specimen Description   Final    BLOOD RIGHT ARM Performed at [facility], [address] [place], [place] [address]    Special Requests   Final    BOTTLES DRAWN AEROBIC AND ANAEROBIC Blood Culture adequate volume Performed at [facility], [address], [place], [place] [address]    Culture  Setup Time   Final    GRAM POSITIVE COCCI IN CLUSTERS AEROBIC BOTTLE ONLY CRITICAL RESULT CALLED TO, READ BACK BY AND VERIFIED WITH: PHARMD MICHELLE L [id number] FCP    Culture (A)  Final    MICROCOCCUS LUTEUS/LYLAE Standardized susceptibility testing for this organism is not available. Performed  at [facility], [address] [place], [place] [address]    Report Status 12/17/2023 FINAL  Final  Blood Culture ID Panel (Reflexed)     Status: None   Collection Time: 12/13/23  3:33 PM  Result Value Ref Range Status   Enterococcus faecalis NOT DETECTED NOT DETECTED Final   Enterococcus Faecium NOT DETECTED NOT DETECTED Final   Listeria monocytogenes NOT DETECTED NOT DETECTED Final   Staphylococcus species NOT DETECTED NOT DETECTED Final   Staphylococcus aureus (BCID) NOT DETECTED NOT DETECTED Final   Staphylococcus epidermidis NOT DETECTED NOT DETECTED Final   Staphylococcus lugdunensis NOT DETECTED NOT DETECTED Final   Streptococcus species NOT DETECTED NOT DETECTED Final   Streptococcus agalactiae NOT DETECTED NOT DETECTED Final   Streptococcus pneumoniae NOT DETECTED NOT DETECTED Final   Streptococcus pyogenes NOT DETECTED NOT DETECTED Final  A.calcoaceticus-baumannii NOT DETECTED NOT DETECTED Final   Bacteroides fragilis NOT DETECTED NOT DETECTED Final   Enterobacterales NOT DETECTED NOT DETECTED Final   Enterobacter cloacae complex NOT DETECTED NOT DETECTED Final   Escherichia coli NOT DETECTED NOT DETECTED Final   Klebsiella aerogenes NOT DETECTED NOT DETECTED Final   Klebsiella oxytoca NOT DETECTED NOT DETECTED Final   Klebsiella pneumoniae NOT DETECTED NOT DETECTED Final   Proteus species NOT DETECTED NOT DETECTED Final   Salmonella species NOT DETECTED NOT DETECTED Final   Serratia marcescens NOT DETECTED NOT DETECTED Final   Haemophilus influenzae NOT DETECTED NOT DETECTED Final   Neisseria meningitidis NOT DETECTED NOT DETECTED Final   Pseudomonas aeruginosa NOT DETECTED NOT DETECTED Final   Stenotrophomonas maltophilia NOT DETECTED NOT DETECTED Final   Candida albicans NOT DETECTED NOT DETECTED Final   Candida auris NOT DETECTED NOT DETECTED Final   Candida glabrata NOT DETECTED NOT DETECTED Final   Candida krusei NOT DETECTED NOT DETECTED Final   Candida  parapsilosis NOT DETECTED NOT DETECTED Final   Candida tropicalis NOT DETECTED NOT DETECTED Final   Cryptococcus neoformans/gattii NOT DETECTED NOT DETECTED Final    Comment: Performed at [facility], [address], [place], [place] [address]  Culture, blood (Routine X 2) w Reflex to ID Panel     Status: None   Collection Time: 12/13/23  3:35 PM   Specimen: BLOOD RIGHT HAND  Result Value Ref Range Status   Specimen Description   Final    BLOOD RIGHT HAND Performed at [facility], [address], [place], [place] [address]    Special Requests   Final    BOTTLES DRAWN AEROBIC AND ANAEROBIC Blood Culture adequate volume Performed at [facility], [address], [place], [place] [address]    Culture   Final    NO GROWTH 5 DAYS Performed at [facility], [address] [place], [place] [address]    Report Status 12/18/2023 FINAL  Final  Urine Culture (for pregnant, neutropenic or urologic patients or patients with an indwelling urinary catheter)     Status: Abnormal   Collection Time: 12/13/23  5:30 PM   Specimen: Urine, Clean Catch  Result Value Ref Range Status   Specimen Description   Final    URINE, CLEAN CATCH Performed at [facility], [address] [place], [place] [address]    Special Requests   Final    NONE Performed at [facility], [address] [place], [place] [address]    Culture >=100,000 COLONIES/mL ENTEROCOCCUS FAECALIS (A)  Final   Report Status 12/15/2023 FINAL  Final   Organism ID, Bacteria ENTEROCOCCUS FAECALIS (A)  Final      Susceptibility   Enterococcus faecalis - MIC*    AMPICILLIN  <=2 SENSITIVE Sensitive     NITROFURANTOIN <=16 SENSITIVE Sensitive     VANCOMYCIN  1 SENSITIVE Sensitive     * >=100,000 COLONIES/mL ENTEROCOCCUS FAECALIS  MRSA Next Gen by PCR, Nasal     Status: None   Collection Time: 12/19/23  4:48 AM   Specimen: Nasal Mucosa; Nasal Swab  Result Value Ref  Range Status   MRSA by PCR Next Gen NOT DETECTED NOT DETECTED Final    Comment: (NOTE) The GeneXpert MRSA Assay (FDA approved for NASAL specimens only), is one component of a comprehensive MRSA colonization surveillance program. It is not intended to diagnose MRSA infection nor to guide or monitor treatment for MRSA infections.  Test performance is not FDA approved in patients less than 64 years old. Performed at [facility], [address], [place], [place] [address]          Radiology Studies: No results found.       Scheduled Meds:  apixaban   5 mg Oral BID   Chlorhexidine  Gluconate Cloth  6 each Topical Q2200   FLUoxetine   20 mg Oral Daily   fluticasone   1 spray Each Nare Daily   metoprolol  tartrate  5 mg Intravenous Q8H   multivitamin with minerals  1 tablet Oral Daily   pantoprazole  (PROTONIX ) IV  40 mg Intravenous Q12H   polyethylene glycol  17 g Oral TID   sodium chloride  flush  10-40 mL Intracatheter Q12H   sodium chloride  flush  10-40 mL Intracatheter Q12H   Continuous Infusions:  ampicillin -sulbactam (UNASYN ) IV 3 g (12/19/23 1101)   feeding supplement (OSMOLITE 1.5 CAL) 1,000 mL (12/19/23 1039)   TPN ADULT (ION) 50 mL/hr at 12/18/23 1818     LOS: 36 days    Time spent: 48 minutes spent on 12/19/2023 caring for this patient face-to-face including chart review, ordering labs/tests, documenting, discussion with nursing staff, consultants, updating family and interview/physical exam    Camellia PARAS [place], DO [facility] Available via Epic secure chat 7am-7pm After these hours, please refer to coverage provider listed on [email or web address] 12/19/2023, 11:35 AM

## 2023-12-19 NOTE — Progress Notes (Signed)
 4 Days Post-Op   Subjective/Chief Complaint: Pain overall controlled. G tube remains to gravity. BM x3. Got OOB to chair for many hours yesterday.   Objective: Vital signs in last 24 hours: Temp:  [97.4 F (36.3 C)-97.8 F (36.6 C)] 97.7 F (36.5 C) (09/16 0610) Pulse Rate:  [77-93] 80 (09/16 0610) Resp:  [16-20] 17 (09/16 0610) BP: (95-127)/(57-68) 127/59 (09/16 0610) SpO2:  [99 %-100 %] 99 % (09/16 0610) Weight:  [86.9 kg] 86.9 kg (09/16 0610) Last BM Date : 12/18/23  Intake/Output from previous day: 09/15 0701 - 09/16 0700 In: 1246.6 [P.O.:50; I.V.:616; NG/GT:490.7] Out: 1825 [Urine:1050; Drains:775] Intake/Output this shift: No intake/output data recorded.  Abdomen: Incisions clean dry intact.  Gastrostomy tube and jejunostomy tube intact with no bleeding.  Gastrostomy to gravity. J tube with tube feeds running @ 40 mL/hr. Abdomen non-distended.  Lab Results:  Recent Labs    12/17/23 0530 12/18/23 0520  WBC 8.1 7.2  HGB 9.1* 8.9*  HCT 29.5* 28.0*  PLT 322 319   BMET Recent Labs    12/17/23 0530 12/18/23 0520  NA 136 136  K 4.0 3.8  CL 103 102  CO2 23 26  GLUCOSE 103* 113*  BUN 24* 25*  CREATININE 0.42* 0.42*  CALCIUM  8.8* 8.4*   PT/INR No results for input(s): LABPROT, INR in the last 72 hours. ABG No results for input(s): PHART, HCO3 in the last 72 hours.  Invalid input(s): PCO2, PO2  Studies/Results: No results found.  Anti-infectives: Anti-infectives (From admission, onward)    Start     Dose/Rate Route Frequency Ordered Stop   12/16/23 1600  Ampicillin -Sulbactam (UNASYN ) 3 g in sodium chloride  0.9 % 100 mL IVPB        3 g 200 mL/hr over 30 Minutes Intravenous Every 6 hours 12/16/23 1453 12/20/23 1149   12/14/23 1400  fluconazole  (DIFLUCAN ) IVPB 400 mg  Status:  Discontinued        400 mg 100 mL/hr over 120 Minutes Intravenous Every 24 hours 12/13/23 1536 12/18/23 1149   12/13/23 2200  vancomycin  (VANCOCIN ) IVPB 1000 mg/200 mL  premix  Status:  Discontinued        1,000 mg 200 mL/hr over 60 Minutes Intravenous 2 times daily 12/13/23 1536 12/16/23 1453   12/13/23 1630  fluconazole  (DIFLUCAN ) IVPB 800 mg  Status:  Discontinued        800 mg 200 mL/hr over 120 Minutes Intravenous  Once 12/13/23 1536 12/13/23 1558   12/13/23 1630  ceFEPIme  (MAXIPIME ) 2 g in sodium chloride  0.9 % 100 mL IVPB  Status:  Discontinued        2 g 200 mL/hr over 30 Minutes Intravenous Every 8 hours 12/13/23 1536 12/16/23 1453   12/13/23 1630  vancomycin  (VANCOCIN ) IVPB 1000 mg/200 mL premix        1,000 mg 200 mL/hr over 60 Minutes Intravenous  Once 12/13/23 1536 12/13/23 1707   12/13/23 1630  fluconazole  (DIFLUCAN ) IVPB 400 mg        400 mg 100 mL/hr over 120 Minutes Intravenous Every 1 hr x 2 12/13/23 1558 12/13/23 1909   11/27/23 1200  erythromycin  500 mg in sodium chloride  0.9 % 100 mL IVPB        500 mg 100 mL/hr over 60 Minutes Intravenous Every 6 hours 11/27/23 0933 11/29/23 0723   11/24/23 1400  erythromycin  500 mg in sodium chloride  0.9 % 100 mL IVPB  Status:  Discontinued        500 mg  100 mL/hr over 60 Minutes Intravenous Every 8 hours 11/24/23 1027 11/27/23 0933   11/23/23 2000  erythromycin  250 mg in sodium chloride  0.9 % 100 mL IVPB  Status:  Discontinued        250 mg 100 mL/hr over 60 Minutes Intravenous Every 8 hours 11/23/23 1744 11/24/23 1027   11/23/23 1200  erythromycin  (E-MYCIN ) tablet 250 mg  Status:  Discontinued        250 mg Oral 3 times daily before meals 11/23/23 1004 11/23/23 1744   11/18/23 1200  erythromycin  (E-MYCIN ) tablet 250 mg  Status:  Discontinued        250 mg Oral 2 times daily with meals 11/18/23 0720 11/19/23 0734   11/13/23 1000  acyclovir  (ZOVIRAX ) tablet 400 mg  Status:  Discontinued        400 mg Oral 2 times daily 11/13/23 0503 11/19/23 0739       Assessment/Plan: s/p Procedure(s) with comments: INSERTION, GASTROSTOMY/JEJUNOSTOMY TUBE, PERCUTANEOUS (N/A) - PEG placement with Endo,  Laparoscopic J-Tube  S/p diagnostic laparoscopy by Dr. [name] on 11/03/23  - Intra-op: Small bowel appeared intermittently dilated and injected consistent with inflammation, small amount of clear ascites, mesenteric edema and injection.  No obstruction present however he does have a very long, mobile small bowel mesentery concerning for intermittent volvulus  - Small bowel follow through on 8/22 with no passage of contrast from stomach to small bowel was noted within the first 3 hours of the exam despite positioning patient to encourage gastric emptying. Patient was returned to the floor and KUB images at 4 hrs, 8 hrs, and 24 hrs after contrast administration were done. Delay films with contrast in the rectum with gaseous distention of small bowel with gas-filled colon. Bx results reviewed.  - He has had trial of erythromycin  and reported as intolerant to Reglan  (hx of Tardive Dyskinesia) - Small bowel endoscopy 8/28 by GI w/ small HH, mild inflammation characterized by erosions from NG tube trauma was found in the gastric body, no evidence of significant pathology in the entire examined duodenum and no evidence of significant pathology at 60 cm ( distal to the pylorus). Biopsy without clear etiology to explain patients symptoms.  - CT 9/2 w/ Small-bowel obstruction with transition point in the mid abdomen. Large amount of stool in the rectum with rectal wall thickening and presacral edema. 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 - IR consulted 9/3 for possible venting G-tube placement and IR felt pt's anatomy is not favorable for percutaneous G tube placement - GI has patient on bowel regimen on TID miralax  with PRN lactulose  enema. He had Linzess  earlier in the admission. Had TD with trial of reglan . GI was looking into possible Motegrity; consider decreasing miralax  if develops worsening diarrhea with TF.     FEN -increase tube feeds to 50 cc an hour, then increase q 8h to goal; ok to wean  TNA VTE - SCDs, therapeutic Lovenox  ID - Cefepime /Vanc/Fluconazole  per primary      LOS: 36 days    David Mcgrath Pringle PA-C 12/19/2023

## 2023-12-19 NOTE — Progress Notes (Signed)
 Nutrition Follow-up  DOCUMENTATION CODES:   Severe malnutrition in context of acute illness/injury  INTERVENTION:   -TPN management per Pharmacy  -weaning today  Continue advancing tube feeds via J-tube Osmolite 1.5 at 70 ml/h (1680 ml per day) Prosource TF20 60 ml 1x/d Free water  flush of 125mL every 4 hours Provides 2600 kcal, 125 gm protein, 1280 ml free water  daily (TF+flush = free water  daily)   NUTRITION DIAGNOSIS:   Severe Malnutrition related to acute illness (recurrent small bowel obstruction) as evidenced by severe fat depletion, severe muscle depletion, energy intake < or equal to 50% for > or equal to 5 days, percent weight loss (22% in 1 month).  Ongoing.  GOAL:   Patient will meet greater than or equal to 90% of their needs  Progressing.  MONITOR:   Diet advancement, Labs, Weight trends  ASSESSMENT:   73 y.o. male with PMH significant for HTN, HLD, chronic anxiety/depression, polyneuropathy, multiple myeloma (diagnosed March 2025). Patient recently admitted both 7/12-7/18 and 7/31-8/3 with intractable nausea vomiting, recurrent small bowel obstruction and was treated conservatively with NG tube decompression and discharged home.  Presented back 8/10 with loose stools and cramping abdominal pain. Admitted for ileus vs partial SBO.  8/10 Admit 8/11 Soft diet 8/13 FLD 8/17 NPO; NGT placed 8/18 TPN initiated 8/28 s/p small bowel enteroscopy; NGT removed 8/29 repeat abdominal xray -> showing partial SBO vs ileus; started on clear liquid diet 8/30 NGT replaced after patient vomited; NPO 9/12 - Op, laparoscopic j-tube insertion, endoscopic placement of gastrostomy tube  9/13 - NGT removed, g-tube being used to vent  TPN to wean off today. Tube feeds via J-tube are being advanced and tolerated towards goal rate. Currently at 50 ml/hr.  G-tube set to gravity.  Admission weight: 184 lbs Current weight: 191 lbs  Medications: Multivitamin with minerals  daily, Miralax   Labs reviewed: CBGs: 90-121  Diet Order:   Diet Order     None       EDUCATION NEEDS:   Education needs have been addressed  Skin:  Skin Assessment: Skin Integrity Issues: Skin Integrity Issues:: Stage II Stage II: Sacrum  Last BM:  9/1 6 -type 7  Height:   Ht Readings from Last 1 Encounters:  11/18/23 6' 4 (1.93 m)    Weight:   Wt Readings from Last 1 Encounters:  12/19/23 86.9 kg    Ideal Body Weight:  91.8 kg  BMI:  Body mass index is 23.32 kg/m.  Estimated Nutritional Needs:   Kcal:  2400-2600 kcals  Protein:  120-135 grams  Fluid:  >/= 2.4L   Morna Lee, MS, RD, LDN Inpatient Clinical Dietitian Contact via Secure chat

## 2023-12-19 NOTE — Progress Notes (Signed)
 Physical Therapy Treatment Patient Details Name: David Mcgrath MRN: [id number] DOB: [birth year] Today's Date: 12/19/2023   History of Present Illness Pt is 73 yo male who presented on 11/12/23 with nausea, vomiting, diarrhea, hypotension.  CT raised concern for ileus vs partial SBO.  Pt recently admitted 7/31-8/3 for SBO, pt underwent diagnostic laparoscopy on 8/1.  This admission pt with persistent ileus, treated with NG tube 8/16 and replaced 8/21.  Surgery consulted and did not feel repeat laparoscopy beneficial.  TPN started 8/18. GI reconsulted for possible EGD but continuing erythromycin  for now. Pt also with hypotension, urinary retention, PAF, hyponatremia/hypokalemia/hypomagnesemia/hypoglycemia.  Pt also with LE edema - findings consistent with chronic DVT.  Pt with hx including but not limited to  hypertension, hyperlipidemia, paroxysmal A-fib on Eliquis , chronic anxiety/depression, polyneuropathy, multiple myeloma (diagnosed March 2025) received Bortezomib  infusion 11/09/2023.    PT Comments  The patient states that he is ready  to get up to recliner, willing to try ambulation. Patient reports recent BM, Assisted to  sitting with mod support, noted another episode of  BM, patient requested to return to bed to get cleaned up.  Mobility now limited due to frequent incontinence of  loose BM.  Continue Attempts for mobility as medical  issues allow. Patient will benefit from intensive inpatient follow-up therapy, >3 hours/day ,    If plan is discharge home, recommend the following: Assistance with cooking/housework;Help with stairs or ramp for entrance;Assist for transportation;Two people to help with walking and/or transfers;A lot of help with bathing/dressing/bathroom   Can travel by private vehicle     No  Equipment Recommendations  None recommended by PT    Recommendations for Other Services       Precautions / Restrictions Precautions Precautions: Fall Recall of  Precautions/Restrictions: Intact Precaution/Restrictions Comments: Jtube, Peg, frequent diarrhea Restrictions Weight Bearing Restrictions Per Provider Order: No     Mobility  Bed Mobility Overal bed mobility: Needs Assistance Bed Mobility: Supine to Sit   Sidelying to sit: Mod assist Supine to sit: Mod assist Sit to supine: +2 for safety/equipment, +2 for physical assistance, Max assist   General bed mobility comments: multimodal cues, patient asking  for guidance, encouraged   pt  to roll and push self up    Transfers Overall transfer level: Needs assistance Equipment used: Rolling walker (2 wheels) Transfers: Sit to/from Stand             General transfer comment: required Mod Assist  from elevated bed  at [place], patient had on  briefs , had had loose BM which was running  outside of brief. Assisted patient back into bed. nsg called.    Ambulation/Gait                   Stairs             Wheelchair Mobility     Tilt Bed    Modified Rankin (Stroke Patients Only)       Balance Overall balance assessment: Needs assistance Sitting-balance support: Bilateral upper extremity supported, Feet supported Sitting balance-Leahy Scale: Fair Sitting balance - Comments: head forward posture Postural control: Other (comment) Standing balance support: During functional activity, Reliant on assistive device for balance, Bilateral upper extremity supported Standing balance-Leahy Scale: Poor                              Communication Communication Communication: No apparent difficulties Factors Affecting Communication: Difficulty  expressing self  Cognition Arousal: Alert Behavior During Therapy: Flat affect   PT - Cognitive impairments: No apparent impairments Difficult to assess due to: Impaired communication                     PT - Cognition Comments: AxO x  following all directions.  Anxious/nervous.  Does NOT like to be rushed.  Spouse present and supportive. Following commands: Intact Following commands impaired: Follows one step commands with increased time    Cueing Cueing Techniques: Verbal cues, Tactile cues  Exercises      General Comments        Pertinent Vitals/Pain Pain Assessment Faces Pain Scale: Hurts a little bit Pain Location: abdomen Pain Descriptors / Indicators: Grimacing Pain Intervention(s): Monitored during session    Home Living                          Prior Function            PT Goals (current goals can now be found in the care plan section) Progress towards PT goals: Progressing toward goals    Frequency    Min 2X/week      PT Plan      Co-evaluation              AM-PAC PT 6 Clicks Mobility   Outcome Measure  Help needed turning from your back to your side while in a flat bed without using bedrails?: A Lot Help needed moving from lying on your back to sitting on the side of a flat bed without using bedrails?: A Lot Help needed moving to and from a bed to a chair (including a wheelchair)?: A Lot Help needed standing up from a chair using your arms (e.g., wheelchair or bedside chair)?: A Lot Help needed to walk in hospital room?: Total Help needed climbing 3-5 steps with a railing? : Total 6 Click Score: 10    End of Session Equipment Utilized During Treatment: Gait belt Activity Tolerance: Patient limited by fatigue Patient left: in chair;with call bell/phone within reach;with chair alarm set;with family/visitor present Nurse Communication: Mobility status PT Visit Diagnosis: Unsteadiness on feet (R26.81);Other abnormalities of gait and mobility (R26.89);Muscle weakness (generalized) (M62.81)     Time: 1130-1150 PT Time Calculation (min) (ACUTE ONLY): 20 min  Charges:    $Therapeutic Activity: 8-22 mins PT General Charges $$ ACUTE PT VISIT: 1 Visit                     Darice Potters PT Acute Rehabilitation Services Office  [phone]    Potters Darice Norris 12/19/2023, 1:26 PM

## 2023-12-19 NOTE — Progress Notes (Signed)
 [name] 3:10 PM  Subjective: Asked by wife to come back and answer question which I did and we discussed his previous multiple myeloma medicine which caused neuropathy and she will ask Dr. 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

## 2023-12-19 NOTE — Progress Notes (Signed)
 PHARMACY - TOTAL PARENTERAL NUTRITION CONSULT NOTE   Indication: Prolonged ileus  Patient Measurements: Height: 6' 4 (193 cm) Weight: 86.9 kg (191 lb 9.3 oz) IBW/kg (Calculated) : 86.8 TPN AdjBW (KG): 94.6 Body mass index is 23.32 kg/m.  Assessment:  Pharmacy is consulted to start TPN on 73 yo male diagnosed with bowel obstruction. This admission CT abdomen shows increasing small bowel dilatation and fecalization of bowel contents involving the jejunum. No definitive transition point is identified at this time but caliber change is noted in the right mid abdomen at the junction of the jejunum and ileum. The more distal ileum is unremarkable. Some suspicion that bowel obstruction may be related to bortezomib  treatment pt receiving for multiple myeloma.   Glucose / Insulin : no hx of DM. Goal CBG 140-180. Currently not on insulin   Electrolytes: phos slightly low at 2.4, Mg 1.7; all others WNL including corrCa 9.5 Renal: WNL Hepatic: AST/ALT slight bump, AlkPhos trending up, alb 2.6; TG stable Intake / Output; MIVF:  -UOP adequate -G tube to gravity with > 1 L output yesterday -LBM 9/15 GI Imaging: -Admitted from 10/14/23-10/20/23 for symptoms related to SBO.  CT: Transition point suspected in the right hemi-abdomen. Received non-operative management.  -Admitted 11/02/23 - 11/05/2023 for small bowel dilatation/concern for small bowel obstruction. CT imaging showed persistent small bowel dilatation with mild transition point noted deep in pelvis. He went to OR 11/03/23, findings were suspicious for intermittent volvulus of small bowel to explain his symptoms and CT imaging.  -8/10  CT abdomen shows increasing small bowel dilatation and fecalization of bowel contents involving the jejunum. No definitive transition point -9/4 Abd XR: likely improving ileus -9/6 Abd XR: concerning for ileus or possibly obstruction -9/8 Abd XR: slightly increased small bowel distension compared to most recent study,  most consistent w/ an ileus -9/10 Abd XR: interval worsening of small bowel dilatation -9/11 Abd XR: persistent distended small-bowel loops, with a loop in the left hemiabdomen measuring up to 5.2 cm, consistent with ileus -9/11 CT a/p: Mildly dilated fluid-filled small bowel loops into the pelvis. Several distal small bowel loops appear mildly thick walled. This could reflect infectious or inflammatory enteritis with small bowel ileus or some degree of resulting functional obstruction. Large bowel is decompressed. GI Surgeries / Procedures:  -8/1 diagnostic laparoscopy -8/28: EGD, enteroscopy - small hiatal hernia, gastritis, duodenum/jejunum appeared normal; several biopsies taken -9/12: venting gastrostomy and feeding jejunostomy placement  Central access:  PICC 8/18   Nutritional Goals: Goal TPN rate is 100 mL/hr (provides 132 g of protein and 2438 kcals per day)  RD Assessment:  Estimated Needs Total Energy Estimated Needs: 2400-2600 kcals Total Protein Estimated Needs: 120-135 grams Total Fluid Estimated Needs: >/= 2.4L  Current Nutrition:  Tube feeds - continuing to advance to goal  Plan:  Per my discussion with Surgery and TRH, we will plan to stop TPN this evening as patient is tolerating advancement of tube feeds and will be at goal overnight. If this changes overnight, will reassess tomorrow and resume TPN if necessary. Will sign off consult for now.  Stefano MARLA Bologna, PharmD, BCPS Clinical Pharmacist 12/19/2023 10:12 AM

## 2023-12-19 NOTE — Plan of Care (Addendum)
 AOX4, VSS on RA. Pain managed with PRN morphine  with relief. Denies pain, n/v. Up with 1 person assist with FWW to recliner, spent some time in recliner this shift. Voiding using external catheter. Incontinent bowel LBM this morning. Incontinent care provided as needed. Chg bath given. Sacral mepilex cdi. L elbow mepilex cdi. Lap sites cdi. J port to osmolite TF @40ml /hr, new bag and tubing hung this morning. G port to gravity bag with greenish bile output. R DL PICC, TPN infusing @ 49fo/ym, other lumen TKO. R midline s/l. MRSA nasal PCR sent to lab. Fall precautions maintained.  Problem: Education: Goal: Knowledge of General Education information will improve Description: Including pain rating scale, medication(s)/side effects and non-pharmacologic comfort measures Outcome: Progressing   Problem: Health Behavior/Discharge Planning: Goal: Ability to manage health-related needs will improve Outcome: Progressing   Problem: Clinical Measurements: Goal: Will remain free from infection Outcome: Progressing Goal: Diagnostic test results will improve Outcome: Progressing   Problem: Nutrition: Goal: Adequate nutrition will be maintained Outcome: Progressing   Problem: Pain Managment: Goal: General experience of comfort will improve and/or be controlled Outcome: Progressing   Problem: Safety: Goal: Ability to remain free from injury will improve Outcome: Progressing   Problem: Skin Integrity: Goal: Risk for impaired skin integrity will decrease Outcome: Progressing   Problem: Nutrition Goal: Patient maintains adequate hydration Outcome: Progressing

## 2023-12-20 DIAGNOSIS — K567 Ileus, unspecified: Secondary | ICD-10-CM | POA: Diagnosis not present

## 2023-12-20 LAB — COMPREHENSIVE METABOLIC PANEL WITH GFR
ALT: 79 U/L — ABNORMAL HIGH (ref 0–44)
AST: 47 U/L — ABNORMAL HIGH (ref 15–41)
Albumin: 2.8 g/dL — ABNORMAL LOW (ref 3.5–5.0)
Alkaline Phosphatase: 166 U/L — ABNORMAL HIGH (ref 38–126)
Anion gap: 11 (ref 5–15)
BUN: 23 mg/dL (ref 8–23)
CO2: 26 mmol/L (ref 22–32)
Calcium: 8.7 mg/dL — ABNORMAL LOW (ref 8.9–10.3)
Chloride: 104 mmol/L (ref 98–111)
Creatinine, Ser: 0.47 mg/dL — ABNORMAL LOW (ref 0.61–1.24)
GFR, Estimated: >60 mL/min (ref >60)
Glucose, Bld: 109 mg/dL — ABNORMAL HIGH (ref 70–99)
Potassium: 4.2 mmol/L (ref 3.5–5.1)
Sodium: 141 mmol/L (ref 135–145)
Total Bilirubin: 0.3 mg/dL (ref 0.0–1.2)
Total Protein: 5.8 g/dL — ABNORMAL LOW (ref 6.5–8.1)

## 2023-12-20 LAB — CBC
HCT: 28.9 % — ABNORMAL LOW (ref 39.0–52.0)
Hemoglobin: 9 g/dL — ABNORMAL LOW (ref 13.0–17.0)
MCH: 30.3 pg (ref 26.0–34.0)
MCHC: 31.1 g/dL (ref 30.0–36.0)
MCV: 97.3 fL (ref 80.0–100.0)
Platelets: 379 K/uL (ref 150–400)
RBC: 2.97 MIL/uL — ABNORMAL LOW (ref 4.22–5.81)
RDW: 16.4 % — ABNORMAL HIGH (ref 11.5–15.5)
WBC: 6.8 K/uL (ref 4.0–10.5)
nRBC: 0 % (ref 0.0–0.2)

## 2023-12-20 LAB — GLUCOSE, CAPILLARY
Glucose-Capillary: 104 mg/dL — ABNORMAL HIGH (ref 70–99)
Glucose-Capillary: 104 mg/dL — ABNORMAL HIGH (ref 70–99)
Glucose-Capillary: 109 mg/dL — ABNORMAL HIGH (ref 70–99)
Glucose-Capillary: 121 mg/dL — ABNORMAL HIGH (ref 70–99)
Glucose-Capillary: 81 mg/dL (ref 70–99)
Glucose-Capillary: 99 mg/dL (ref 70–99)

## 2023-12-20 LAB — MAGNESIUM: Magnesium: 1.9 mg/dL (ref 1.7–2.4)

## 2023-12-20 LAB — PHOSPHORUS: Phosphorus: 3.9 mg/dL (ref 2.5–4.6)

## 2023-12-20 MED ORDER — ACETAMINOPHEN 160 MG/5ML PO SOLN
650.0000 mg | Freq: Three times a day (TID) | ORAL | Status: DC
  Administered 2023-12-20 – 2023-12-21 (×4): 650 mg
  Filled 2023-12-20 (×4): qty 20.3

## 2023-12-20 MED ORDER — HYDROCODONE-ACETAMINOPHEN 7.5-325 MG/15ML PO SOLN
10.0000 mL | Freq: Four times a day (QID) | ORAL | Status: DC | PRN
  Filled 2023-12-20: qty 15

## 2023-12-20 MED ORDER — BANATROL TF EN LIQD
60.0000 mL | Freq: Three times a day (TID) | ENTERAL | Status: DC
  Administered 2023-12-20 (×3): 60 mL
  Filled 2023-12-20 (×5): qty 60

## 2023-12-20 MED ORDER — ACETAMINOPHEN 650 MG RE SUPP: 650.0000 mg | Freq: Three times a day (TID) | RECTAL | Status: DC

## 2023-12-20 MED ORDER — POLYETHYLENE GLYCOL 3350 17 G PO PACK
17.0000 g | PACK | Freq: Two times a day (BID) | ORAL | Status: DC
  Filled 2023-12-20: qty 1

## 2023-12-20 NOTE — Progress Notes (Signed)
 Occupational Therapy Treatment Patient Details Name: David Mcgrath MRN: [id number] DOB: 11-12-1950 Today's Date: 12/20/2023   History of present illness Pt is 73 yo male who presented on 11/12/23 with nausea, vomiting, diarrhea, hypotension.  CT raised concern for ileus vs partial SBO.  Pt recently admitted 7/31-8/3 for SBO, pt underwent diagnostic laparoscopy on 8/1.  This admission pt with persistent ileus, treated with NG tube 8/16 and replaced 8/21.  Surgery consulted and did not feel repeat laparoscopy beneficial.  TPN started 8/18. GI reconsulted for possible EGD but continuing erythromycin  for now. Pt also with hypotension, urinary retention, PAF, hyponatremia/hypokalemia/hypomagnesemia/hypoglycemia.  Pt also with LE edema - findings consistent with chronic DVT.  Pt with hx including but not limited to  hypertension, hyperlipidemia, paroxysmal A-fib on Eliquis , chronic anxiety/depression, polyneuropathy, multiple myeloma (diagnosed March 2025) received Bortezomib  infusion 11/09/2023.   OT comments  Patient seen for skilled OT session this am. Wife and patient working on UE tband and foam ball therex upon OT arrival. Patient motivated and eager for all therapy presented. EOB full grooming routine including shaving to progress sitting balance and tolerance. Upon STS for commode and recliner transfers, patient with loose bowels thus returned bed level with OT and nursing assist to reposition and NT to assist with bed level hygiene.  Patient requires continued Acute care hospital level OT services to progress safety and functional performance and allow for discharge. Patient will benefit from intensive inpatient follow-up therapy, >3 hours/day.         If plan is discharge home, recommend the following:  Two people to help with walking and/or transfers;Two people to help with bathing/dressing/bathroom;Assistance with cooking/housework;Assistance with feeding;Direct supervision/assist for  medications management;Direct supervision/assist for financial management;Assist for transportation;Help with stairs or ramp for entrance   Equipment Recommendations  Other (comment) (TBD upon rehab recs)       Precautions / Restrictions Precautions Precautions: Fall Recall of Precautions/Restrictions: Intact Precaution/Restrictions Comments: Jtube, Peg, frequent diarrhea Restrictions Weight Bearing Restrictions Per Provider Order: No       Mobility Bed Mobility Overal bed mobility: Needs Assistance Bed Mobility: Supine to Sit, Sit to Supine Rolling: Mod assist, Used rails Sidelying to sit: Mod assist, HOB elevated, Used rails Supine to sit: Mod assist, HOB elevated, Used rails Sit to supine: Mod assist, Max assist, +2 for physical assistance, +2 for safety/equipment, HOB elevated, Used rails   General bed mobility comments: cues, assist for multiple lines and tubes    Transfers Overall transfer level: Needs assistance Equipment used: Rolling walker (2 wheels) Transfers: Sit to/from Stand Sit to Stand: Mod assist, +2 physical assistance           General transfer comment: attempt for commode then recliner transfers with patient eager and loose stool occurance with need for hygiene     Balance Overall balance assessment: Needs assistance Sitting-balance support: Feet supported, Single extremity supported Sitting balance-Leahy Scale: Fair Sitting balance - Comments: head forward posture   Standing balance support: During functional activity Standing balance-Leahy Scale: Poor Standing balance comment: STS to RW with steadying then need to sit for stool incontinence                           ADL either performed or assessed with clinical judgement   ADL Overall ADL's : Needs assistance/impaired Eating/Feeding: Set up;Sitting (ice chips this session with no issues spoon to mouth)   Grooming: Contact guard assist;Sitting;Wash/dry hands;Wash/dry face;Oral  care;Applying deodorant;Brushing hair  Grooming Details (indicate cue type and reason): shavign routine as well seated EOB for up to 10 minutes prior to recliner transfer STS Upper Body Bathing: Contact guard assist;Sitting   Lower Body Bathing: Maximal assistance Lower Body Bathing Details (indicate cue type and reason): unable to reach to buttocks or lower legs Upper Body Dressing : Moderate assistance   Lower Body Dressing: Total assistance;Sit to/from stand;Maximal assistance Lower Body Dressing Details (indicate cue type and reason): Foley and rectal collection tube Toilet Transfer:  (STS with loose stool occurence)           Functional mobility during ADLs: Moderate assistance;+2 for physical assistance;+2 for safety/equipment;Rolling walker (2 wheels) General ADL Comments: improved seated level unsupported EOB full grooming tasks tolerance    Extremity/Trunk Assessment Upper Extremity Assessment Upper Extremity Assessment: Generalized weakness   Lower Extremity Assessment Lower Extremity Assessment: Defer to PT evaluation        Vision   Vision Assessment?: No apparent visual deficits;Wears glasses for reading         Communication Communication Communication: No apparent difficulties   Cognition Arousal: Alert Behavior During Therapy: Flat affect Cognition: Cognition impaired     Awareness: Online awareness impaired Memory impairment (select all impairments): Short-term memory Attention impairment (select first level of impairment): Divided attention Executive functioning impairment (select all impairments): Problem solving OT - Cognition Comments: able to sequence through shaving and grooming routine, becomes mildly overwhelmed when multiple staff are present, does best limiting multiple directions                 Following commands: Intact Following commands impaired: Follows one step commands with increased time      Cueing   Cueing Techniques:  Verbal cues, Tactile cues  Exercises Exercises: Other exercises (patient was completing yellow tband and grasp ball exercies with wife upon OT arrival for session)       General Comments EOB with improved tolerance this session with ulilateral then no support for up to 10-15 min    Pertinent Vitals/ Pain       Pain Assessment Pain Assessment: Faces Faces Pain Scale: Hurts a little bit Pain Location: abdomen Pain Descriptors / Indicators: Grimacing Pain Intervention(s): Monitored during session, Repositioned   Frequency  Min 2X/week        Progress Toward Goals  OT Goals(current goals can now be found in the care plan section)  Progress towards OT goals: Progressing toward goals  Acute Rehab OT Goals Patient Stated Goal: to start to walk OT Goal Formulation: With patient/family Time For Goal Achievement: 12/27/23 Potential to Achieve Goals: Good ADL Goals Pt Will Perform Lower Body Bathing: with min assist;sit to/from stand Pt Will Perform Lower Body Dressing: with min assist;sit to/from stand Pt Will Transfer to Toilet: with min assist;bedside commode Pt Will Perform Toileting - Clothing Manipulation and hygiene: with min assist;sit to/from stand Pt/caregiver will Perform Home Exercise Program: Increased strength;Both right and left upper extremity;With written HEP provided;With Supervision Additional ADL Goal #1: Patient will integrate simple ECT into BADL's and mobility with min cues  Plan         AM-PAC OT 6 Clicks Daily Activity     Outcome Measure   Help from another person eating meals?: Total (ice chips) Help from another person taking care of personal grooming?: A Little Help from another person toileting, which includes using toliet, bedpan, or urinal?: Total Help from another person bathing (including washing, rinsing, drying)?: A Lot Help from another person to put  on and taking off regular upper body clothing?: A Lot Help from another person to put on  and taking off regular lower body clothing?: Total 6 Click Score: 10    End of Session Equipment Utilized During Treatment: Rolling walker (2 wheels);Gait belt  OT Visit Diagnosis: Unsteadiness on feet (R26.81);Muscle weakness (generalized) (M62.81);Other abnormalities of gait and mobility (R26.89)   Activity Tolerance Patient tolerated treatment well   Patient Left in bed;with call bell/phone within reach;with bed alarm set   Nurse Communication Mobility status;Other (comment) (nursing assist and aware of bowel incont)        Time: 9064-8974 OT Time Calculation (min): 50 min  Charges: OT General Charges $OT Visit: 1 Visit OT Treatments $Self Care/Home Management : 8-22 mins $Therapeutic Activity: 8-22 mins  Tawny Raspberry OT/L Acute Rehabilitation Department  [phone]  12/20/2023, 11:54 AM

## 2023-12-20 NOTE — Plan of Care (Signed)
  Problem: Education: Goal: Knowledge of General Education information will improve Description: Including pain rating scale, medication(s)/side effects and non-pharmacologic comfort measures Outcome: Progressing   Problem: Health Behavior/Discharge Planning: Goal: Ability to manage health-related needs will improve Outcome: Progressing   Problem: Clinical Measurements: Goal: Ability to maintain clinical measurements within normal limits will improve Outcome: Progressing Goal: Will remain free from infection Outcome: Progressing Goal: Diagnostic test results will improve Outcome: Progressing Goal: Respiratory complications will improve Outcome: Progressing Goal: Cardiovascular complication will be avoided Outcome: Progressing   Problem: Activity: Goal: Risk for activity intolerance will decrease Outcome: Progressing   Problem: Nutrition: Goal: Adequate nutrition will be maintained Outcome: Progressing   Problem: Coping: Goal: Level of anxiety will decrease Outcome: Progressing   Problem: Elimination: Goal: Will not experience complications related to bowel motility Outcome: Progressing Goal: Will not experience complications related to urinary retention Outcome: Progressing   Problem: Pain Managment: Goal: General experience of comfort will improve and/or be controlled Outcome: Progressing   Problem: Safety: Goal: Ability to remain free from injury will improve Outcome: Progressing   Problem: Skin Integrity: Goal: Risk for impaired skin integrity will decrease Outcome: Progressing   Problem: Nutrition Goal: Patient maintains adequate hydration Outcome: Progressing Goal: Patient maintains weight Outcome: Progressing   Problem: Education: Goal: Individualized Educational Video(s) Outcome: Progressing   Problem: Coping: Goal: Ability to adjust to condition or change in health will improve Outcome: Progressing   Problem: Fluid Volume: Goal: Ability to  maintain a balanced intake and output will improve Outcome: Progressing   Problem: Health Behavior/Discharge Planning: Goal: Ability to identify and utilize available resources and services will improve Outcome: Progressing Goal: Ability to manage health-related needs will improve Outcome: Progressing   Problem: Nutritional: Goal: Maintenance of adequate nutrition will improve Outcome: Progressing Goal: Progress toward achieving an optimal weight will improve Outcome: Progressing   Problem: Skin Integrity: Goal: Risk for impaired skin integrity will decrease Outcome: Progressing   Problem: Tissue Perfusion: Goal: Adequacy of tissue perfusion will improve Outcome: Progressing

## 2023-12-20 NOTE — Progress Notes (Signed)
 Inpatient Rehab Admissions Coordinator:   Pt mobilizing well with therapy after surgical intervention last week.  Tube feeds at goal, TPN off, and recommendations to clamp g-tube once output decreases.  Will need to review with PMR MD and then will send to insurance potentially tomorrow.    Reche Lowers, PT, DPT Admissions Coordinator [phone] 12/20/23  2:00 PM

## 2023-12-20 NOTE — Progress Notes (Signed)
 PROGRESS NOTE  David Mcgrath  FMW:[id number] DOB: 20-Mar-1951 DOA: 11/12/2023 PCP: [name], DO   Brief Narrative: Patient is a 73 male with history of hypertension, hyperlipidemia, paroxysmal A-fib on Eliquis  anxiety/depression, multiple myeloma status post bortezomib  infusion, with neuropathy who presented with worsening abdominal pain, distention.  Found to have persistent  ileus on abdominal imaging.  General surgery, GI consulted.  Underwent upper GI series, small bowel follow-through with confirmation of delayed gastric emptying, status post endoscopy, exploratory laparoscopy without clear finding of symptoms.  Started on intermittent suction via G-tube with TPN for nutrition.  Status post diagnostic laparoscopy, G-tube, G-tube placement on 9/12 by Dr. Patrici.  J-tube feeds at goal.  G-tube remains to gravity.   Hospital course also remarkable for Enterococcus faecalis.  Prolonged hospitalization.  General surgery recommended outpatient follow-up.  PT recommended SNF/CIR on discharge.  TOC following.Waiting for placement, medically stable for discharge whenever possible  Assessment & Plan:  Principal Problem:   Ileus (HCC) Active Problems:   Hypokalemia   Paroxysmal atrial fibrillation (HCC)   Multiple myeloma (HCC)   Essential hypertension   Depression   Hyperlipidemia   Pressure injury of skin   Protein-calorie malnutrition, severe   Hypernatremia   Swelling of lower extremity   Transaminitis   Acute urinary retention   NSVT (nonsustained ventricular tachycardia) (HCC)  Prolonged ileus/partial small bowel obstruction/severe constipation: Presented with worsening abdominal pain, distention.  Imaging showed continued ileus, small bowel obstruction.  Patient underwent EGD, expiratory laparoscopy without clear explanation of symptoms.  IR unable to place GJ tube due to poor windows.  Did not tolerate erythromycin  trial.  Did not tolerate Reglan .  General surgery  finally did laparoscopic jejunostomy and gastrostomy tube placement on 9/12.  TPN has been stopped now. J-tube feeds at goal, at 70 mL/h.  G-tube remains to gravity.  General surgery recommended outpatient follow-up.  Will attempt to remove rectal tube today  Severe protein calorie moderation: Initially on TPN.  Currently on tube feed.  Nutritionist following.On continuous feed from J tube  Enterococcus UTI: Became febrile during this hospitalization.  Urine culture showed significant Enterococcus faecalis.  Blood cultures showed Micrococcus luteus, suspect contamination.  Treated with Unasyn , completed antibiotic course  Normocytic anemia: Transfused 2 units of PRBC in 12/05/2023.  Currently hemoglobin stable  Hypokalemia/hypomagnesemia: Currently being monitored and supplemented as needed  Multiple myeloma: Follows with Dr. Federico  Paroxysmal A-fib: Not on rate control agents.  On Eliquis  for anticoagulation.  Currently in normal sinus rhythm  Hyperlipidemia: On Zetia  and pravastatin  at home, currently on hold  Peripheral neuropathy: Gabapentin   GERD: Continue Protonix   Anxiety/depression: On Prozac   Debility/deconditioning: PT recommended SNF on discharge.  TOC following    Nutrition Problem: Severe Malnutrition Etiology: acute illness (recurrent small bowel obstruction) Wound 11/12/23 0130 Pressure Injury Sacrum Mid Stage 2 -  Partial thickness loss of dermis presenting as a shallow open injury with a red, pink wound bed without slough. (Active)    DVT prophylaxis:Place and maintain sequential compression device Start: 11/16/23 1142 apixaban  (ELIQUIS ) tablet 5 mg     Code Status: Full Code  Family Communication: Called and discussed with the wife Vertell on phone on 9/17  Patient status:Inpatient  Patient is from :Home  Anticipated discharge to:SNF/CIR  Estimated DC date:whenever possible   Consultants: GI,general surgery  Procedures:As above  Antimicrobials:   Anti-infectives (From admission, onward)    Start     Dose/Rate Route Frequency Ordered Stop   12/16/23 1600  Ampicillin -Sulbactam (UNASYN ) 3 g in sodium chloride  0.9 % 100 mL IVPB        3 g 200 mL/hr over 30 Minutes Intravenous Every 6 hours 12/16/23 1453 12/20/23 1149   12/14/23 1400  fluconazole  (DIFLUCAN ) IVPB 400 mg  Status:  Discontinued        400 mg 100 mL/hr over 120 Minutes Intravenous Every 24 hours 12/13/23 1536 12/18/23 1149   12/13/23 2200  vancomycin  (VANCOCIN ) IVPB 1000 mg/200 mL premix  Status:  Discontinued        1,000 mg 200 mL/hr over 60 Minutes Intravenous 2 times daily 12/13/23 1536 12/16/23 1453   12/13/23 1630  fluconazole  (DIFLUCAN ) IVPB 800 mg  Status:  Discontinued        800 mg 200 mL/hr over 120 Minutes Intravenous  Once 12/13/23 1536 12/13/23 1558   12/13/23 1630  ceFEPIme  (MAXIPIME ) 2 g in sodium chloride  0.9 % 100 mL IVPB  Status:  Discontinued        2 g 200 mL/hr over 30 Minutes Intravenous Every 8 hours 12/13/23 1536 12/16/23 1453   12/13/23 1630  vancomycin  (VANCOCIN ) IVPB 1000 mg/200 mL premix        1,000 mg 200 mL/hr over 60 Minutes Intravenous  Once 12/13/23 1536 12/13/23 1707   12/13/23 1630  fluconazole  (DIFLUCAN ) IVPB 400 mg        400 mg 100 mL/hr over 120 Minutes Intravenous Every 1 hr x 2 12/13/23 1558 12/13/23 1909   11/27/23 1200  erythromycin  500 mg in sodium chloride  0.9 % 100 mL IVPB        500 mg 100 mL/hr over 60 Minutes Intravenous Every 6 hours 11/27/23 0933 11/29/23 0723   11/24/23 1400  erythromycin  500 mg in sodium chloride  0.9 % 100 mL IVPB  Status:  Discontinued        500 mg 100 mL/hr over 60 Minutes Intravenous Every 8 hours 11/24/23 1027 11/27/23 0933   11/23/23 2000  erythromycin  250 mg in sodium chloride  0.9 % 100 mL IVPB  Status:  Discontinued        250 mg 100 mL/hr over 60 Minutes Intravenous Every 8 hours 11/23/23 1744 11/24/23 1027   11/23/23 1200  erythromycin  (E-MYCIN ) tablet 250 mg  Status:  Discontinued         250 mg Oral 3 times daily before meals 11/23/23 1004 11/23/23 1744   11/18/23 1200  erythromycin  (E-MYCIN ) tablet 250 mg  Status:  Discontinued        250 mg Oral 2 times daily with meals 11/18/23 0720 11/19/23 0734   11/13/23 1000  acyclovir  (ZOVIRAX ) tablet 400 mg  Status:  Discontinued        400 mg Oral 2 times daily 11/13/23 0503 11/19/23 0739       Subjective: Patient seen and examined at bedside today.  J-tube running.  G-tube draining bilious fluid.  Rectal tube in place.  Denies any abdomen pain, nausea or vomiting.  Comfortable.  Alert and oriented  Objective: Vitals:   12/19/23 2030 12/19/23 2308 12/20/23 0615 12/20/23 1059  BP: 108/65 108/65 (!) 103/48 125/75  Pulse: 92 92 84 85  Resp: 18  18   Temp: 97.8 F (36.6 C)  98 F (36.7 C)   TempSrc: Oral  Oral   SpO2: 99%  100%   Weight:   79.5 kg   Height:        Intake/Output Summary (Last 24 hours) at 12/20/2023 1259 Last data filed at 12/20/2023 0700  Gross per 24 hour  Intake 120 ml  Output 2650 ml  Net -2530 ml   Filed Weights   12/18/23 0500 12/19/23 0610 12/20/23 0615  Weight: 84.5 kg 86.9 kg 79.5 kg    Examination:  General exam: Overall comfortable, not in distress, chronically deconditioned HEENT: PERRL Respiratory system:  no wheezes or crackles  Cardiovascular system: S1 & S2 heard, RRR.  Gastrointestinal system: Abdomen is nondistended, soft and nontender.  J-tube, G-tube, rectal tube Central nervous system: Alert and oriented Extremities: No edema, no clubbing ,no cyanosis Skin: No rashes, no ulcers,no icterus     Data Reviewed: I have personally reviewed following labs and imaging studies  CBC: Recent Labs  Lab 12/15/23 0500 12/16/23 0535 12/17/23 0530 12/18/23 0520 12/20/23 0500  WBC 9.8 10.1 8.1 7.2 6.8  HGB 10.4* 9.3* 9.1* 8.9* 9.0*  HCT 33.1* 30.9* 29.5* 28.0* 28.9*  MCV 99.1 99.0 98.3 97.9 97.3  PLT 355 318 322 319 379   Basic Metabolic Panel: Recent Labs  Lab  12/14/23 0510 12/15/23 0500 12/16/23 0535 12/17/23 0530 12/18/23 0520 12/20/23 0500  NA 136 137 136 136 136 141  K 4.6 4.7 4.8 4.0 3.8 4.2  CL 107 108 107 103 102 104  CO2 18* 20* 18* 23 26 26   GLUCOSE 115* 116* 137* 103* 113* 109*  BUN 40* 33* 31* 24* 25* 23  CREATININE 0.64 0.54* 0.53* 0.42* 0.42* 0.47*  CALCIUM  8.9 8.9 8.9 8.8* 8.4* 8.7*  MG 2.0  --   --   --  1.7 1.9  PHOS 3.6  --   --   --  2.4* 3.9     Recent Results (from the past 240 hours)  Culture, blood (Routine X 2) w Reflex to ID Panel     Status: Abnormal   Collection Time: 12/13/23  3:33 PM   Specimen: BLOOD RIGHT ARM  Result Value Ref Range Status   Specimen Description   Final    BLOOD RIGHT ARM Performed at [facility], [address] [place], [place] [address]    Special Requests   Final    BOTTLES DRAWN AEROBIC AND ANAEROBIC Blood Culture adequate volume Performed at [facility], [address] [place], [place] [address]    Culture  Setup Time   Final    GRAM POSITIVE COCCI IN CLUSTERS AEROBIC BOTTLE ONLY CRITICAL RESULT CALLED TO, READ BACK BY AND VERIFIED WITH: PHARMD MICHELLE L [id number] FCP    Culture (A)  Final    MICROCOCCUS LUTEUS/LYLAE Standardized susceptibility testing for this organism is not available. Performed at [facility], [address], [place], [place] [address]    Report Status 12/17/2023 FINAL  Final  Blood Culture ID Panel (Reflexed)     Status: None   Collection Time: 12/13/23  3:33 PM  Result Value Ref Range Status   Enterococcus faecalis NOT DETECTED NOT DETECTED Final   Enterococcus Faecium NOT DETECTED NOT DETECTED Final   Listeria monocytogenes NOT DETECTED NOT DETECTED Final   Staphylococcus species NOT DETECTED NOT DETECTED Final   Staphylococcus aureus (BCID) NOT DETECTED NOT DETECTED Final   Staphylococcus epidermidis NOT DETECTED NOT DETECTED Final   Staphylococcus lugdunensis NOT DETECTED NOT DETECTED Final    Streptococcus species NOT DETECTED NOT DETECTED Final   Streptococcus agalactiae NOT DETECTED NOT DETECTED Final   Streptococcus pneumoniae NOT DETECTED NOT DETECTED Final   Streptococcus pyogenes NOT DETECTED NOT DETECTED Final   A.calcoaceticus-baumannii NOT DETECTED NOT  DETECTED Final   Bacteroides fragilis NOT DETECTED NOT DETECTED Final   Enterobacterales NOT DETECTED NOT DETECTED Final   Enterobacter cloacae complex NOT DETECTED NOT DETECTED Final   Escherichia coli NOT DETECTED NOT DETECTED Final   Klebsiella aerogenes NOT DETECTED NOT DETECTED Final   Klebsiella oxytoca NOT DETECTED NOT DETECTED Final   Klebsiella pneumoniae NOT DETECTED NOT DETECTED Final   Proteus species NOT DETECTED NOT DETECTED Final   Salmonella species NOT DETECTED NOT DETECTED Final   Serratia marcescens NOT DETECTED NOT DETECTED Final   Haemophilus influenzae NOT DETECTED NOT DETECTED Final   Neisseria meningitidis NOT DETECTED NOT DETECTED Final   Pseudomonas aeruginosa NOT DETECTED NOT DETECTED Final   Stenotrophomonas maltophilia NOT DETECTED NOT DETECTED Final   Candida albicans NOT DETECTED NOT DETECTED Final   Candida auris NOT DETECTED NOT DETECTED Final   Candida glabrata NOT DETECTED NOT DETECTED Final   Candida krusei NOT DETECTED NOT DETECTED Final   Candida parapsilosis NOT DETECTED NOT DETECTED Final   Candida tropicalis NOT DETECTED NOT DETECTED Final   Cryptococcus neoformans/gattii NOT DETECTED NOT DETECTED Final    Comment: Performed at [facility], [address], [place], [place] [address]  Culture, blood (Routine X 2) w Reflex to ID Panel     Status: None   Collection Time: 12/13/23  3:35 PM   Specimen: BLOOD RIGHT HAND  Result Value Ref Range Status   Specimen Description   Final    BLOOD RIGHT HAND Performed at [facility], [address], [place], [place] [address]    Special Requests   Final    BOTTLES DRAWN AEROBIC AND ANAEROBIC Blood Culture adequate  volume Performed at [facility], [address], [place], [place] [address]    Culture   Final    NO GROWTH 5 DAYS Performed at [facility], [address], [place], [place] [address]    Report Status 12/18/2023 FINAL  Final  Urine Culture (for pregnant, neutropenic or urologic patients or patients with an indwelling urinary catheter)     Status: Abnormal   Collection Time: 12/13/23  5:30 PM   Specimen: Urine, Clean Catch  Result Value Ref Range Status   Specimen Description   Final    URINE, CLEAN CATCH Performed at [facility], [address], [place], [place] [address]    Special Requests   Final    NONE Performed at [facility], [address] [place], [place] [address]    Culture >=100,000 COLONIES/mL ENTEROCOCCUS FAECALIS (A)  Final   Report Status 12/15/2023 FINAL  Final   Organism ID, Bacteria ENTEROCOCCUS FAECALIS (A)  Final      Susceptibility   Enterococcus faecalis - MIC*    AMPICILLIN  <=2 SENSITIVE Sensitive     NITROFURANTOIN <=16 SENSITIVE Sensitive     VANCOMYCIN  1 SENSITIVE Sensitive     * >=100,000 COLONIES/mL ENTEROCOCCUS FAECALIS  MRSA Next Gen by PCR, Nasal     Status: None   Collection Time: 12/19/23  4:48 AM   Specimen: Nasal Mucosa; Nasal Swab  Result Value Ref Range Status   MRSA by PCR Next Gen NOT DETECTED NOT DETECTED Final    Comment: (NOTE) The GeneXpert MRSA Assay (FDA approved for NASAL specimens only), is one component of a comprehensive MRSA colonization surveillance program. It is not intended to diagnose MRSA infection nor to guide or monitor treatment for MRSA infections. Test performance is not FDA  approved in patients less than 10 years old. Performed at [facility], [address] [place], [place] [address]      Radiology Studies: No results found.  Scheduled Meds:  acetaminophen  (TYLENOL ) oral liquid 160 mg/5 mL  650 mg Per Tube Q8H   Or    acetaminophen   650 mg Rectal Q8H   apixaban   5 mg Oral BID   Chlorhexidine  Gluconate Cloth  6 each Topical Q2200   feeding supplement (PROSource TF20)  60 mL Per Tube Daily   fiber supplement (BANATROL TF)  60 mL Per Tube TID   FLUoxetine   20 mg Oral Daily   fluticasone   1 spray Each Nare Daily   metoprolol  tartrate  12.5 mg Oral BID   multivitamin with minerals  1 tablet Oral Daily   pantoprazole  (PROTONIX ) IV  40 mg Intravenous Q12H   polyethylene glycol  17 g Oral BID   sodium chloride  flush  10-40 mL Intracatheter Q12H   sodium chloride  flush  10-40 mL Intracatheter Q12H   Continuous Infusions:  feeding supplement (OSMOLITE 1.5 CAL) 70 mL/hr at 12/20/23 0314     LOS: 37 days   Ivonne Mustache, MD [facility] P9/17/2025, 12:59 PM

## 2023-12-20 NOTE — Progress Notes (Addendum)
 5 Days Post-Op   Subjective/Chief Complaint: Pain overall controlled- reports abdominal pain with nursing care/moving in bed. Reports abdominal soreness when getting OOB. Tolerating J tube feeds at goal and has watery diarrhea, rectal tube placed overnight.  Objective: Vital signs in last 24 hours: Temp:  [97.7 F (36.5 C)-98 F (36.7 C)] 98 F (36.7 C) (09/17 0615) Pulse Rate:  [84-92] 84 (09/17 0615) Resp:  [16-18] 18 (09/17 0615) BP: (103-108)/(48-65) 103/48 (09/17 0615) SpO2:  [96 %-100 %] 100 % (09/17 0615) Weight:  [79.5 kg] 79.5 kg (09/17 0615) Last BM Date : 12/19/23  Intake/Output from previous day: 09/16 0701 - 09/17 0700 In: 520 [P.O.:120] Out: 3350 [Urine:1250; Drains:2100] Intake/Output this shift: No intake/output data recorded.  Abdomen: Incisions clean dry intact.  Gastrostomy tube and jejunostomy tube intact with no bleeding or drainage. Gastrostomy to gravity. J tube with tube feeds running @ 70 mL/hr. Abdomen non-distended. Rectal tube in place with liquid brown stool.   Lab Results:  Recent Labs    12/18/23 0520 12/20/23 0500  WBC 7.2 6.8  HGB 8.9* 9.0*  HCT 28.0* 28.9*  PLT 319 379   BMET Recent Labs    12/18/23 0520 12/20/23 0500  NA 136 141  K 3.8 4.2  CL 102 104  CO2 26 26  GLUCOSE 113* 109*  BUN 25* 23  CREATININE 0.42* 0.47*  CALCIUM  8.4* 8.7*   PT/INR No results for input(s): LABPROT, INR in the last 72 hours. ABG No results for input(s): PHART, HCO3 in the last 72 hours.  Invalid input(s): PCO2, PO2  Studies/Results: No results found.  Anti-infectives: Anti-infectives (From admission, onward)    Start     Dose/Rate Route Frequency Ordered Stop   12/16/23 1600  Ampicillin -Sulbactam (UNASYN ) 3 g in sodium chloride  0.9 % 100 mL IVPB        3 g 200 mL/hr over 30 Minutes Intravenous Every 6 hours 12/16/23 1453 12/20/23 1149   12/14/23 1400  fluconazole  (DIFLUCAN ) IVPB 400 mg  Status:  Discontinued        400  mg 100 mL/hr over 120 Minutes Intravenous Every 24 hours 12/13/23 1536 12/18/23 1149   12/13/23 2200  vancomycin  (VANCOCIN ) IVPB 1000 mg/200 mL premix  Status:  Discontinued        1,000 mg 200 mL/hr over 60 Minutes Intravenous 2 times daily 12/13/23 1536 12/16/23 1453   12/13/23 1630  fluconazole  (DIFLUCAN ) IVPB 800 mg  Status:  Discontinued        800 mg 200 mL/hr over 120 Minutes Intravenous  Once 12/13/23 1536 12/13/23 1558   12/13/23 1630  ceFEPIme  (MAXIPIME ) 2 g in sodium chloride  0.9 % 100 mL IVPB  Status:  Discontinued        2 g 200 mL/hr over 30 Minutes Intravenous Every 8 hours 12/13/23 1536 12/16/23 1453   12/13/23 1630  vancomycin  (VANCOCIN ) IVPB 1000 mg/200 mL premix        1,000 mg 200 mL/hr over 60 Minutes Intravenous  Once 12/13/23 1536 12/13/23 1707   12/13/23 1630  fluconazole  (DIFLUCAN ) IVPB 400 mg        400 mg 100 mL/hr over 120 Minutes Intravenous Every 1 hr x 2 12/13/23 1558 12/13/23 1909   11/27/23 1200  erythromycin  500 mg in sodium chloride  0.9 % 100 mL IVPB        500 mg 100 mL/hr over 60 Minutes Intravenous Every 6 hours 11/27/23 0933 11/29/23 0723   11/24/23 1400  erythromycin  500 mg in  sodium chloride  0.9 % 100 mL IVPB  Status:  Discontinued        500 mg 100 mL/hr over 60 Minutes Intravenous Every 8 hours 11/24/23 1027 11/27/23 0933   11/23/23 2000  erythromycin  250 mg in sodium chloride  0.9 % 100 mL IVPB  Status:  Discontinued        250 mg 100 mL/hr over 60 Minutes Intravenous Every 8 hours 11/23/23 1744 11/24/23 1027   11/23/23 1200  erythromycin  (E-MYCIN ) tablet 250 mg  Status:  Discontinued        250 mg Oral 3 times daily before meals 11/23/23 1004 11/23/23 1744   11/18/23 1200  erythromycin  (E-MYCIN ) tablet 250 mg  Status:  Discontinued        250 mg Oral 2 times daily with meals 11/18/23 0720 11/19/23 0734   11/13/23 1000  acyclovir  (ZOVIRAX ) tablet 400 mg  Status:  Discontinued        400 mg Oral 2 times daily 11/13/23 0503 11/19/23 0739        Assessment/Plan: s/p Procedure(s) with comments: INSERTION, GASTROSTOMY/JEJUNOSTOMY TUBE, PERCUTANEOUS (N/A) - PEG placement with Endo, Laparoscopic J-Tube  S/p diagnostic laparoscopy by Dr. [name] on 11/03/23  - Intra-op: Small bowel appeared intermittently dilated and injected consistent with inflammation, small amount of clear ascites, mesenteric edema and injection.  No obstruction present however he does have a very long, mobile small bowel mesentery concerning for intermittent volvulus  - Small bowel follow through on 8/22 with no passage of contrast from stomach to small bowel was noted within the first 3 hours of the exam despite positioning patient to encourage gastric emptying. Patient was returned to the floor and KUB images at 4 hrs, 8 hrs, and 24 hrs after contrast administration were done. Delay films with contrast in the rectum with gaseous distention of small bowel with gas-filled colon. Bx results reviewed.  - He has had trial of erythromycin  and reported as intolerant to Reglan  (hx of Tardive Dyskinesia) - Small bowel endoscopy 8/28 by GI w/ small HH, mild inflammation characterized by erosions from NG tube trauma was found in the gastric body, no evidence of significant pathology in the entire examined duodenum and no evidence of significant pathology at 60 cm ( distal to the pylorus). Biopsy without clear etiology to explain patients symptoms.  - CT 9/2 w/ Small-bowel obstruction with transition point in the mid abdomen. Large amount of stool in the rectum with rectal wall thickening and presacral edema. 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 - IR consulted 9/3 for possible venting G-tube placement and IR felt pt's anatomy is not favorable for percutaneous G tube placement - GI has patient on bowel regimen on TID miralax  with PRN lactulose  enema. He had Linzess  earlier in the admission. Had TD with trial of reglan . GI was looking into possible Motegrity; appreciate Dr.  Rosalie stopping by yesterday and following as an outpatient for trial of pro-motility agents.  - s/p Dx laparoscopy, G tube and J tube placement 9/12 Dr. Stevie. J Tube feeds at goal. G tube remains to gravity. Add liquid tylenol  and PRN hycet per J tube today for improved pain control. Start banatrol as below. Abdominal binder ordered for patient comfort and keep to tubes in place. May remove while in bed. Surgery will sign off. We will be available as needed. We will be able to start clamp trials while he is in CIR, or as an outpatient, once G tube output goes down. Defer bowel regimen and motility  meds to GI. D/C rectal tube ASAP to avoid rectal ulceration or complications and encourage OOB/mobility.    FEN -tube feeds @ goal. Add banatrol to help thicken stool; low threshold to resume miralax  once his stool bulks up to avoid constipation in the setting of known intestinal motility disoder. VTE - SCDs, therapeutic Lovenox  ID - Cefepime /Vanc/Fluconazole  per primary      LOS: 37 days    David GORMAN Pringle PA-C 12/20/2023

## 2023-12-20 NOTE — Plan of Care (Signed)

## 2023-12-20 NOTE — TOC Progression Note (Signed)
 Transition of Care [facility]) - Progression Note    Patient Details  Name: David Mcgrath MRN: [id number] Date of Birth: December 22, 1950  [facility] CM/SW Contact  Doneta Glenys DASEN, RN Phone Number: 12/20/2023, 2:02 PM  Clinical Narrative:    CM reached out to CIR Cartlin. Per Cartlin , she willcheck with dr. babs in the AM and if dr. babs okay with the clamping trial will start insurance tomorrow    Expected Discharge Plan: Skilled Nursing Facility Barriers to Discharge: Continued Medical Work up               Expected Discharge Plan and Services In-house Referral: NA Discharge Planning Services: CM Consult Post Acute Care Choice: Skilled Nursing Facility Living arrangements for the past 2 months: Single Family Home                 DME Arranged: N/A DME Agency: NA       HH Arranged: NA HH Agency: NA         Social Drivers of Health (SDOH) Interventions SDOH Screenings   Food Insecurity: No Food Insecurity (11/13/2023)  Housing: Low Risk  (11/13/2023)  Transportation Needs: No Transportation Needs (11/13/2023)  Utilities: Not At Risk (11/13/2023)  Depression (PHQ2-9): Low Risk  (11/09/2023)  Social Connections: Socially Integrated (11/13/2023)  Tobacco Use: Low Risk  (12/15/2023)    Readmission Risk Interventions    11/05/2023   10:31 AM 10/15/2023   11:45 AM  Readmission Risk Prevention Plan  Transportation Screening Complete Complete  PCP or Specialist Appt within 3-5 Days Complete Complete  HRI or Home Care Consult Complete Complete  Social Work Consult for Recovery Care Planning/Counseling Complete Complete  Palliative Care Screening Not Applicable Not Applicable  Medication Review [employer]) Complete Complete

## 2023-12-21 ENCOUNTER — Inpatient Hospital Stay: Admitting: Dietician

## 2023-12-21 ENCOUNTER — Inpatient Hospital Stay: Attending: Physician Assistant

## 2023-12-21 ENCOUNTER — Inpatient Hospital Stay: Admitting: Hematology and Oncology

## 2023-12-21 ENCOUNTER — Ambulatory Visit

## 2023-12-21 ENCOUNTER — Inpatient Hospital Stay (HOSPITAL_COMMUNITY)

## 2023-12-21 ENCOUNTER — Inpatient Hospital Stay

## 2023-12-21 ENCOUNTER — Other Ambulatory Visit

## 2023-12-21 DIAGNOSIS — K567 Ileus, unspecified: Secondary | ICD-10-CM | POA: Diagnosis not present

## 2023-12-21 LAB — CBC
HCT: 37.7 % — ABNORMAL LOW (ref 39.0–52.0)
HCT: 37.9 % — ABNORMAL LOW (ref 39.0–52.0)
Hemoglobin: 11.8 g/dL — ABNORMAL LOW (ref 13.0–17.0)
Hemoglobin: 12 g/dL — ABNORMAL LOW (ref 13.0–17.0)
MCH: 30.6 pg (ref 26.0–34.0)
MCH: 31.2 pg (ref 26.0–34.0)
MCHC: 31.3 g/dL (ref 30.0–36.0)
MCHC: 31.7 g/dL (ref 30.0–36.0)
MCV: 97.9 fL (ref 80.0–100.0)
MCV: 98.4 fL (ref 80.0–100.0)
Platelets: 645 K/uL — ABNORMAL HIGH (ref 150–400)
Platelets: 659 K/uL — ABNORMAL HIGH (ref 150–400)
RBC: 3.85 MIL/uL — ABNORMAL LOW (ref 4.22–5.81)
RBC: 3.85 MIL/uL — ABNORMAL LOW (ref 4.22–5.81)
RDW: 16.7 % — ABNORMAL HIGH (ref 11.5–15.5)
RDW: 16.8 % — ABNORMAL HIGH (ref 11.5–15.5)
WBC: 39.1 K/uL — ABNORMAL HIGH (ref 4.0–10.5)
WBC: 46.6 K/uL — ABNORMAL HIGH (ref 4.0–10.5)
nRBC: 0 % (ref 0.0–0.2)
nRBC: 0 % (ref 0.0–0.2)

## 2023-12-21 LAB — BASIC METABOLIC PANEL WITH GFR
Anion gap: 21 — ABNORMAL HIGH (ref 5–15)
BUN: 31 mg/dL — ABNORMAL HIGH (ref 8–23)
CO2: 18 mmol/L — ABNORMAL LOW (ref 22–32)
Calcium: 9.4 mg/dL (ref 8.9–10.3)
Chloride: 98 mmol/L (ref 98–111)
Creatinine, Ser: 1.53 mg/dL — ABNORMAL HIGH (ref 0.61–1.24)
GFR, Estimated: 48 mL/min — ABNORMAL LOW (ref >60)
Glucose, Bld: 136 mg/dL — ABNORMAL HIGH (ref 70–99)
Potassium: 5.2 mmol/L — ABNORMAL HIGH (ref 3.5–5.1)
Sodium: 137 mmol/L (ref 135–145)

## 2023-12-21 LAB — GLUCOSE, CAPILLARY
Glucose-Capillary: 104 mg/dL — ABNORMAL HIGH (ref 70–99)
Glucose-Capillary: 126 mg/dL — ABNORMAL HIGH (ref 70–99)
Glucose-Capillary: 98 mg/dL (ref 70–99)

## 2023-12-21 LAB — LACTIC ACID, PLASMA: Lactic Acid, Venous: 5.6 mmol/L (ref 0.5–1.9)

## 2023-12-21 MED ORDER — SODIUM CHLORIDE 0.9 % IV SOLN: INTRAVENOUS | Status: DC

## 2023-12-21 MED ORDER — MIDAZOLAM HCL 2 MG/2ML IJ SOLN
2.0000 mg | INTRAMUSCULAR | Status: DC | PRN
  Administered 2023-12-21: 2 mg via INTRAVENOUS
  Filled 2023-12-21: qty 2

## 2023-12-21 MED ORDER — ACETAMINOPHEN 650 MG RE SUPP: 650.0000 mg | Freq: Four times a day (QID) | RECTAL | Status: DC | PRN

## 2023-12-21 MED ORDER — FENTANYL CITRATE PF 50 MCG/ML IJ SOSY
PREFILLED_SYRINGE | INTRAMUSCULAR | Status: AC
  Administered 2023-12-21: 50 ug via INTRAVENOUS
  Filled 2023-12-21: qty 2

## 2023-12-21 MED ORDER — MORPHINE 100MG IN NS 100ML (1MG/ML) PREMIX INFUSION
INTRAVENOUS | Status: AC
  Administered 2023-12-21: 5 mg/h via INTRAVENOUS
  Filled 2023-12-21: qty 100

## 2023-12-21 MED ORDER — SODIUM ZIRCONIUM CYCLOSILICATE 10 G PO PACK: 10.0000 g | PACK | Freq: Once | ORAL | Status: DC

## 2023-12-21 MED ORDER — MIDAZOLAM HCL 2 MG/2ML IJ SOLN
INTRAMUSCULAR | Status: AC
  Filled 2023-12-21: qty 2

## 2023-12-21 MED ORDER — POLYVINYL ALCOHOL 1.4 % OP SOLN: 1.0000 [drp] | Freq: Four times a day (QID) | OPHTHALMIC | Status: DC | PRN

## 2023-12-21 MED ORDER — ACETAMINOPHEN 325 MG PO TABS: 650.0000 mg | ORAL_TABLET | Freq: Four times a day (QID) | ORAL | Status: DC | PRN

## 2023-12-21 MED ORDER — ROCURONIUM BROMIDE 10 MG/ML (PF) SYRINGE
PREFILLED_SYRINGE | INTRAVENOUS | Status: AC
  Filled 2023-12-21: qty 10

## 2023-12-21 MED ORDER — NOREPINEPHRINE 4 MG/250ML-% IV SOLN
INTRAVENOUS | Status: AC
  Filled 2023-12-21: qty 250

## 2023-12-21 MED ORDER — MORPHINE BOLUS VIA INFUSION
5.0000 mg | INTRAVENOUS | Status: DC | PRN
  Administered 2023-12-21 (×4): 5 mg via INTRAVENOUS

## 2023-12-21 MED ORDER — PHENYLEPHRINE 80 MCG/ML (10ML) SYRINGE FOR IV PUSH (FOR BLOOD PRESSURE SUPPORT)
PREFILLED_SYRINGE | INTRAVENOUS | Status: AC
  Filled 2023-12-21: qty 10

## 2023-12-21 MED ORDER — KETAMINE HCL 50 MG/5ML IJ SOSY
PREFILLED_SYRINGE | INTRAMUSCULAR | Status: AC
  Filled 2023-12-21: qty 10

## 2023-12-21 MED ORDER — GLYCOPYRROLATE 1 MG PO TABS: 1.0000 mg | ORAL_TABLET | ORAL | Status: DC | PRN

## 2023-12-21 MED ORDER — ETOMIDATE 2 MG/ML IV SOLN
INTRAVENOUS | Status: AC
  Filled 2023-12-21: qty 20

## 2023-12-21 MED ORDER — MORPHINE 100MG IN NS 100ML (1MG/ML) PREMIX INFUSION: 0.0000 mg/h | INTRAVENOUS | Status: DC

## 2023-12-21 MED ORDER — SODIUM BICARBONATE 8.4 % IV SOLN
INTRAVENOUS | Status: AC
  Filled 2023-12-21: qty 50

## 2023-12-21 MED ORDER — IOHEXOL 300 MG/ML  SOLN
100.0000 mL | Freq: Once | INTRAMUSCULAR | Status: AC | PRN
  Administered 2023-12-21: 100 mL via INTRAVENOUS

## 2023-12-21 MED ORDER — GLYCOPYRROLATE 0.2 MG/ML IJ SOLN: 0.2000 mg | INTRAMUSCULAR | Status: DC | PRN

## 2023-12-21 MED ORDER — LACTATED RINGERS IV BOLUS
500.0000 mL | Freq: Once | INTRAVENOUS | Status: AC
  Administered 2023-12-21: 500 mL via INTRAVENOUS

## 2023-12-21 MED ORDER — GLYCOPYRROLATE 0.2 MG/ML IJ SOLN
0.2000 mg | INTRAMUSCULAR | Status: DC | PRN
  Administered 2023-12-21: 0.2 mg via INTRAVENOUS
  Filled 2023-12-21: qty 1

## 2023-12-21 MED ORDER — FENTANYL CITRATE PF 50 MCG/ML IJ SOSY: 50.0000 ug | PREFILLED_SYRINGE | Freq: Once | INTRAMUSCULAR | Status: AC

## 2023-12-26 LAB — SURGICAL PATHOLOGY

## 2023-12-27 ENCOUNTER — Other Ambulatory Visit

## 2023-12-27 ENCOUNTER — Ambulatory Visit: Admitting: Physician Assistant

## 2023-12-27 ENCOUNTER — Inpatient Hospital Stay: Attending: Physician Assistant

## 2023-12-27 ENCOUNTER — Ambulatory Visit

## 2023-12-27 ENCOUNTER — Inpatient Hospital Stay

## 2024-01-03 ENCOUNTER — Ambulatory Visit: Admitting: Physician Assistant

## 2024-01-03 ENCOUNTER — Other Ambulatory Visit

## 2024-01-03 ENCOUNTER — Ambulatory Visit

## 2024-01-03 NOTE — Progress Notes (Signed)
 Inpatient Rehab Admissions Coordinator:   Discussed with surgery PA and plan for transition to comfort care.  No further role for CIR.   Reche Lowers, PT, DPT Admissions Coordinator [phone] January 08, 2024  11:49 AM

## 2024-01-03 NOTE — Progress Notes (Signed)
 PROGRESS NOTE  David Mcgrath  FMW:[id number] DOB: 15-Aug-1950 DOA: 11/12/2023 PCP: [name], DO   Brief Narrative: Patient is a 73 male with history of hypertension, hyperlipidemia, paroxysmal A-fib on Eliquis  anxiety/depression, multiple myeloma status post bortezomib  infusion, with neuropathy who presented with worsening abdominal pain, distention.  Found to have persistent  ileus on abdominal imaging.  General surgery, GI consulted.  Underwent upper GI series, small bowel follow-through with confirmation of delayed gastric emptying, status post endoscopy, exploratory laparoscopy without clear finding of symptoms.  Started on intermittent suction via G-tube with TPN for nutrition.  Status post diagnostic laparoscopy, G-tube, G-tube placement on 9/12 by Dr. Patrici.  J-tube feeds at goal.  G-tube remains to gravity.   Hospital course also remarkable for Enterococcus faecalis.  Prolonged hospitalization.   Patient acutely decompensated this morning.  He complained of abdominal discomfort.  Abdomen was found to be distended.  Lab work showed elevated leukocytes, AKI, hyperkalemia.  Abdominal x-ray showed moderate volume pneumoperitoneum ,worsening gaseous distention of the small bowel throughout the abdomen.  Bowel perforation suspected.  Patient emergently transferred to ICU.  Due to his comorbidities and current imaging findings, goals of care discussed with his wife.  Now he has been transitioned to comfort care.  Palliative care also consulted  Assessment & Plan:  Principal Problem:   Ileus (HCC) Active Problems:   Hypokalemia   Paroxysmal atrial fibrillation (HCC)   Multiple myeloma (HCC)   Essential hypertension   Depression   Hyperlipidemia   Pressure injury of skin   Protein-calorie malnutrition, severe   Hypernatremia   Swelling of lower extremity   Transaminitis   Acute urinary retention   NSVT (nonsustained ventricular tachycardia) (HCC)  Prolonged ileus/partial  small bowel obstruction/severe constipation: Initially presented with worsening abdominal pain, distention.  Imaging showed continued ileus, small bowel obstruction.  Patient underwent EGD, expiratory laparoscopy without clear explanation of symptoms.  IR was unable to place GJ tube due to poor windows.  Did not tolerate erythromycin  trial.  Did not tolerate Reglan .  General surgery finally did laparoscopic jejunostomy and gastrostomy tube placement on 9/12.  TPN  stopped . J-tube feed started. Patient acutely decompensated this morning.  He complained of abdominal discomfort.  Abdomen was found to be distended.  Lab work showed elevated leukocytes, AKI, hyperkalemia.  Abdominal x-ray showed moderate volume pneumoperitoneum ,worsening gaseous distention of the small bowel throughout the abdomen.  Bowel perforation suspected.  Patient emergently transferred to ICU.  Due to his comorbidities and current imaging findings, goals of care discussed with his wife.  Now he has been transitioned to comfort care.  Palliative care also consulted  Severe protein calorie moderation: Initially on TPN then started on  tube feed.  Now on comfort care  Enterococcus UTI: Became febrile during this hospitalization.  Urine culture showed significant Enterococcus faecalis.  Blood cultures showed Micrococcus luteus, suspect contamination.  Treated with Unasyn , completed antibiotic course  Normocytic anemia: Transfused 2 units of PRBC in 12/05/2023.   Multiple myeloma: He was following with Dr. Federico  Paroxysmal A-fib: Not on rate control agents. He was on  Eliquis  for anticoagulation.   Hyperlipidemia: On Zetia  and pravastatin  at home  Peripheral neuropathy: Was on Gabapentin   Anxiety/depression: Was on Prozac   Debility/deconditioning/Goals of care: PT recommended SNF on discharge.  Now on comfort care.  Palliative  care following.  Likely hospital death    Nutrition Problem: Severe Malnutrition Etiology: acute  illness (recurrent small bowel obstruction) Wound 11/12/23 0130 Pressure  Injury Sacrum Mid Stage 2 -  Partial thickness loss of dermis presenting as a shallow open injury with a red, pink wound bed without slough. (Active)    DVT prophylaxis:     Code Status: Do not attempt resuscitation (DNR) - Comfort care  Family Communication: Discussed with the wife Vertell at bedside on 11-Jan-2024  Patient status:Inpatient  Patient is from :Home  Anticipated discharge to:not sure  Estimated DC date:not sure, likely hospital death   Consultants: GI,general surgery  Procedures:As above  Antimicrobials:  Anti-infectives (From admission, onward)    Start     Dose/Rate Route Frequency Ordered Stop   12/16/23 1600  Ampicillin -Sulbactam (UNASYN ) 3 g in sodium chloride  0.9 % 100 mL IVPB        3 g 200 mL/hr over 30 Minutes Intravenous Every 6 hours 12/16/23 1453 12/20/23 1149   12/14/23 1400  fluconazole  (DIFLUCAN ) IVPB 400 mg  Status:  Discontinued        400 mg 100 mL/hr over 120 Minutes Intravenous Every 24 hours 12/13/23 1536 12/18/23 1149   12/13/23 2200  vancomycin  (VANCOCIN ) IVPB 1000 mg/200 mL premix  Status:  Discontinued        1,000 mg 200 mL/hr over 60 Minutes Intravenous 2 times daily 12/13/23 1536 12/16/23 1453   12/13/23 1630  fluconazole  (DIFLUCAN ) IVPB 800 mg  Status:  Discontinued        800 mg 200 mL/hr over 120 Minutes Intravenous  Once 12/13/23 1536 12/13/23 1558   12/13/23 1630  ceFEPIme  (MAXIPIME ) 2 g in sodium chloride  0.9 % 100 mL IVPB  Status:  Discontinued        2 g 200 mL/hr over 30 Minutes Intravenous Every 8 hours 12/13/23 1536 12/16/23 1453   12/13/23 1630  vancomycin  (VANCOCIN ) IVPB 1000 mg/200 mL premix        1,000 mg 200 mL/hr over 60 Minutes Intravenous  Once 12/13/23 1536 12/13/23 1707   12/13/23 1630  fluconazole  (DIFLUCAN ) IVPB 400 mg        400 mg 100 mL/hr over 120 Minutes Intravenous Every 1 hr x 2 12/13/23 1558 12/13/23 1909   11/27/23 1200   erythromycin  500 mg in sodium chloride  0.9 % 100 mL IVPB        500 mg 100 mL/hr over 60 Minutes Intravenous Every 6 hours 11/27/23 0933 11/29/23 0723   11/24/23 1400  erythromycin  500 mg in sodium chloride  0.9 % 100 mL IVPB  Status:  Discontinued        500 mg 100 mL/hr over 60 Minutes Intravenous Every 8 hours 11/24/23 1027 11/27/23 0933   11/23/23 2000  erythromycin  250 mg in sodium chloride  0.9 % 100 mL IVPB  Status:  Discontinued        250 mg 100 mL/hr over 60 Minutes Intravenous Every 8 hours 11/23/23 1744 11/24/23 1027   11/23/23 1200  erythromycin  (E-MYCIN ) tablet 250 mg  Status:  Discontinued        250 mg Oral 3 times daily before meals 11/23/23 1004 11/23/23 1744   11/18/23 1200  erythromycin  (E-MYCIN ) tablet 250 mg  Status:  Discontinued        250 mg Oral 2 times daily with meals 11/18/23 0720 11/19/23 0734   11/13/23 1000  acyclovir  (ZOVIRAX ) tablet 400 mg  Status:  Discontinued        400 mg Oral 2 times daily 11/13/23 0503 11/19/23 0739       Subjective: Patient seen and examined at bedside today.  He complained of abdominal pain, vomiting, diarrhea.  Abdomen found to be distended.  Appeared very sick.  Abdominal x-ray showed pneumoperitoneum.  Transferred to ICU.  Objective: Vitals:   12/20/23 1949 12/20/23 2251 2023-12-23 0531 12/23/2023 0612  BP: 116/70 116/70 (!) 86/55 (!) 104/54  Pulse: 91 91 (!) 57 (!) 57  Resp: 18  20   Temp: 97.8 F (36.6 C)  97.7 F (36.5 C)   TempSrc: Oral     SpO2: 100%  93%   Weight:      Height:        Intake/Output Summary (Last 24 hours) at 2023/12/23 1143 Last data filed at 2023-12-23 0700 Gross per 24 hour  Intake --  Output 1050 ml  Net -1050 ml   Filed Weights   12/18/23 0500 12/19/23 0610 12/20/23 0615  Weight: 84.5 kg 86.9 kg 79.5 kg    Examination:   General exam: Sick appearing, deconditioned, chronically ill looking HEENT: PERRL Respiratory system:  no wheezes or crackles  Cardiovascular system: S1 & S2 heard,  RRR.  Gastrointestinal system: Abdomen distended, no bowel sounds heard, G-tube, G-tube Central nervous system: Alert and oriented Extremities: No edema, no clubbing ,no cyanosis Skin: No rashes, no ulcers,no icterus     Data Reviewed: I have personally reviewed following labs and imaging studies  CBC: Recent Labs  Lab 12/17/23 0530 12/18/23 0520 12/20/23 0500 23-Dec-2023 0650 2023-12-23 0823  WBC 8.1 7.2 6.8 39.1* 46.6*  HGB 9.1* 8.9* 9.0* 11.8* 12.0*  HCT 29.5* 28.0* 28.9* 37.7* 37.9*  MCV 98.3 97.9 97.3 97.9 98.4  PLT 322 319 379 645* 659*   Basic Metabolic Panel: Recent Labs  Lab 12/16/23 0535 12/17/23 0530 12/18/23 0520 12/20/23 0500 12/23/23 0823  NA 136 136 136 141 137  K 4.8 4.0 3.8 4.2 5.2*  CL 107 103 102 104 98  CO2 18* 23 26 26  18*  GLUCOSE 137* 103* 113* 109* 136*  BUN 31* 24* 25* 23 31*  CREATININE 0.53* 0.42* 0.42* 0.47* 1.53*  CALCIUM  8.9 8.8* 8.4* 8.7* 9.4  MG  --   --  1.7 1.9  --   PHOS  --   --  2.4* 3.9  --      Recent Results (from the past 240 hours)  Culture, blood (Routine X 2) w Reflex to ID Panel     Status: Abnormal   Collection Time: 12/13/23  3:33 PM   Specimen: BLOOD RIGHT ARM  Result Value Ref Range Status   Specimen Description   Final    BLOOD RIGHT ARM Performed at [facility], [address], [place], [place] [address]    Special Requests   Final    BOTTLES DRAWN AEROBIC AND ANAEROBIC Blood Culture adequate volume Performed at [facility], [address] [place], [place] [address]    Culture  Setup Time   Final    GRAM POSITIVE COCCI IN CLUSTERS AEROBIC BOTTLE ONLY CRITICAL RESULT CALLED TO, READ BACK BY AND VERIFIED WITH: PHARMD MICHELLE L [id number] FCP    Culture (A)  Final    MICROCOCCUS LUTEUS/LYLAE Standardized susceptibility testing for this organism is not available. Performed at [facility], [address] [place], [place] [address]    Report Status 12/17/2023 FINAL  Final   Blood Culture ID Panel (Reflexed)     Status: None   Collection Time: 12/13/23  3:33 PM  Result Value Ref Range Status   Enterococcus faecalis NOT DETECTED NOT DETECTED  Final   Enterococcus Faecium NOT DETECTED NOT DETECTED Final   Listeria monocytogenes NOT DETECTED NOT DETECTED Final   Staphylococcus species NOT DETECTED NOT DETECTED Final   Staphylococcus aureus (BCID) NOT DETECTED NOT DETECTED Final   Staphylococcus epidermidis NOT DETECTED NOT DETECTED Final   Staphylococcus lugdunensis NOT DETECTED NOT DETECTED Final   Streptococcus species NOT DETECTED NOT DETECTED Final   Streptococcus agalactiae NOT DETECTED NOT DETECTED Final   Streptococcus pneumoniae NOT DETECTED NOT DETECTED Final   Streptococcus pyogenes NOT DETECTED NOT DETECTED Final   A.calcoaceticus-baumannii NOT DETECTED NOT DETECTED Final   Bacteroides fragilis NOT DETECTED NOT DETECTED Final   Enterobacterales NOT DETECTED NOT DETECTED Final   Enterobacter cloacae complex NOT DETECTED NOT DETECTED Final   Escherichia coli NOT DETECTED NOT DETECTED Final   Klebsiella aerogenes NOT DETECTED NOT DETECTED Final   Klebsiella oxytoca NOT DETECTED NOT DETECTED Final   Klebsiella pneumoniae NOT DETECTED NOT DETECTED Final   Proteus species NOT DETECTED NOT DETECTED Final   Salmonella species NOT DETECTED NOT DETECTED Final   Serratia marcescens NOT DETECTED NOT DETECTED Final   Haemophilus influenzae NOT DETECTED NOT DETECTED Final   Neisseria meningitidis NOT DETECTED NOT DETECTED Final   Pseudomonas aeruginosa NOT DETECTED NOT DETECTED Final   Stenotrophomonas maltophilia NOT DETECTED NOT DETECTED Final   Candida albicans NOT DETECTED NOT DETECTED Final   Candida auris NOT DETECTED NOT DETECTED Final   Candida glabrata NOT DETECTED NOT DETECTED Final   Candida krusei NOT DETECTED NOT DETECTED Final   Candida parapsilosis NOT DETECTED NOT DETECTED Final   Candida tropicalis NOT DETECTED NOT DETECTED Final    Cryptococcus neoformans/gattii NOT DETECTED NOT DETECTED Final    Comment: Performed at [facility], [address], [place], [place] [address]  Culture, blood (Routine X 2) w Reflex to ID Panel     Status: None   Collection Time: 12/13/23  3:35 PM   Specimen: BLOOD RIGHT HAND  Result Value Ref Range Status   Specimen Description   Final    BLOOD RIGHT HAND Performed at [facility], [address], [place], [place] [address]    Special Requests   Final    BOTTLES DRAWN AEROBIC AND ANAEROBIC Blood Culture adequate volume Performed at [facility], [address] [place], [place] [address]    Culture   Final    NO GROWTH 5 DAYS Performed at [facility], [address], [place], [place] [address]    Report Status 12/18/2023 FINAL  Final  Urine Culture (for pregnant, neutropenic or urologic patients or patients with an indwelling urinary catheter)     Status: Abnormal   Collection Time: 12/13/23  5:30 PM   Specimen: Urine, Clean Catch  Result Value Ref Range Status   Specimen Description   Final    URINE, CLEAN CATCH Performed at [facility], [address], [place], [place] [address]    Special Requests   Final    NONE Performed at [facility], [address], [place], [place] [address]    Culture >=100,000 COLONIES/mL ENTEROCOCCUS FAECALIS (A)  Final   Report Status 12/15/2023 FINAL  Final   Organism ID, Bacteria ENTEROCOCCUS FAECALIS (A)  Final      Susceptibility   Enterococcus faecalis - MIC*    AMPICILLIN  <=2 SENSITIVE Sensitive     NITROFURANTOIN <=16 SENSITIVE Sensitive     VANCOMYCIN  1 SENSITIVE Sensitive     * >=  100,000 COLONIES/mL ENTEROCOCCUS FAECALIS  MRSA Next Gen by PCR, Nasal     Status: None   Collection Time: 12/19/23  4:48 AM   Specimen: Nasal Mucosa; Nasal Swab  Result Value Ref Range Status   MRSA by PCR Next Gen NOT DETECTED NOT DETECTED Final    Comment: (NOTE) The  GeneXpert MRSA Assay (FDA approved for NASAL specimens only), is one component of a comprehensive MRSA colonization surveillance program. It is not intended to diagnose MRSA infection nor to guide or monitor treatment for MRSA infections. Test performance is not FDA approved in patients less than 20 years old. Performed at [facility], [address], [place], [place] [address]      Radiology Studies: DG Abd Portable 1V Result Date: December 29, 2023 CLINICAL DATA:  [id number] Vomiting [id number] EXAM: PORTABLE ABDOMEN - 1 VIEW COMPARISON:  December 14, 2022 FINDINGS: Diffuse gaseous distention of the small bowel measuring up to 6 cm.Moderate volume pneumoperitoneum. Percutaneous jejunostomy tube overlying the left abdomen. No acute fracture or destructive lesion. The lung bases are clear. IMPRESSION: 1. Moderate volume pneumoperitoneum, more than would be expected for this stage in the postoperative recovery related to the percutaneous jejunostomy placement. Follow-up CT of the abdomen and pelvis with IV contrast is recommended. 2. Worsening gaseous distention of the small bowel throughout the abdomen. This can also be evaluated on the aforementioned CT. Critical Value/emergent results were called by telephone at the time of interpretation on 12/29/23 at 10:38 am to provider [facility] PA, who verbally acknowledged these results. Electronically Signed   By: Rogelia Myers M.D.   On: December 29, 2023 10:40    Scheduled Meds:   Continuous Infusions:  morphine  5 mg/hr (December 29, 2023 1132)     LOS: 38 days   Ivonne Mustache, MD [facility] P09/26/25, 11:43 AM

## 2024-01-03 NOTE — Progress Notes (Addendum)
 [facility] Progress Note  6 Days Post-Op  Subjective: CC:  Developed vomiting overnight along with ongoing diarrhea. Reports increased abdominal pain.  G tube as remained to gravity and per RN has been working, output not documented since yesterday  Objective: Vital signs in last 24 hours: Temp:  [97.7 F (36.5 C)-98.1 F (36.7 C)] 97.7 F (36.5 C) 2024-01-14 0531) Pulse Rate:  [57-91] 57 January 14, 2024 0612) Resp:  [18-20] 20 01/14/24 0531) BP: (86-125)/(54-75) 104/54 January 14, 2024 0612) SpO2:  [93 %-100 %] 93 % 2024-01-14 0531) Last BM Date : 12/20/23  Intake/Output from previous day: 09/17 0701 - 01-14-2024 0700 In: -  Out: 1050 [Urine:450; Emesis/NG output:600] Intake/Output this shift: No intake/output data recorded.  PE: Gen:  Alert, ill appearing, dry mouth, pale  Card:  Regular rate and rhythm, no lower extremity edema  Pulm:  Normal effort ORA, tachypnea or bordeline tachypnea with respirations ~24 bpm during my exam Abd: Soft, minimally distended, globally tender to light palpation, G tube to gravity with green clear effluent < 50 mL in bag.  J tube feeds at 40 mL/hr Both G tube and J tube are secured without drainage or signs of dislodgement. Skin: warm and dry, no rashes; RUE PICC Psych: A&Ox3   Lab Results:  Recent Labs    January 14, 2024 0650 01-14-2024 0823  WBC 39.1* 46.6*  HGB 11.8* 12.0*  HCT 37.7* 37.9*  PLT 645* 659*   BMET Recent Labs    12/20/23 0500 01/14/2024 0823  NA 141 137  K 4.2 5.2*  CL 104 98  CO2 26 18*  GLUCOSE 109* 136*  BUN 23 31*  CREATININE 0.47* 1.53*  CALCIUM  8.7* 9.4   PT/INR No results for input(s): LABPROT, INR in the last 72 hours. CMP     Component Value Date/Time   NA 137 01-14-2024 0823   NA 141 08/02/2023 0942   K 5.2 (H) 2024/01/14 0823   CL 98 01/14/2024 0823   CO2 18 (L) 01/14/2024 0823   GLUCOSE 136 (H) 01-14-2024 0823   BUN 31 (H) 01-14-2024 0823   BUN 7 (L) 08/02/2023 0942   CREATININE 1.53 (H) 14-Jan-2024 0823    CREATININE 0.72 11/09/2023 1512   CALCIUM  9.4 01/14/24 0823   PROT 5.8 (L) 12/20/2023 0500   PROT 7.0 01/09/2020 1445   ALBUMIN  2.8 (L) 12/20/2023 0500   AST 47 (H) 12/20/2023 0500   AST 36 11/09/2023 1512   ALT 79 (H) 12/20/2023 0500   ALT 32 11/09/2023 1512   ALKPHOS 166 (H) 12/20/2023 0500   BILITOT 0.3 12/20/2023 0500   BILITOT 0.5 11/09/2023 1512   GFRNONAA 48 (L) 2024-01-14 0823   GFRNONAA >60 11/09/2023 1512   Lipase  No results found for: LIPASE     Studies/Results: No results found.  Anti-infectives: Anti-infectives (From admission, onward)    Start     Dose/Rate Route Frequency Ordered Stop   12/16/23 1600  Ampicillin -Sulbactam (UNASYN ) 3 g in sodium chloride  0.9 % 100 mL IVPB        3 g 200 mL/hr over 30 Minutes Intravenous Every 6 hours 12/16/23 1453 12/20/23 1149   12/14/23 1400  fluconazole  (DIFLUCAN ) IVPB 400 mg  Status:  Discontinued        400 mg 100 mL/hr over 120 Minutes Intravenous Every 24 hours 12/13/23 1536 12/18/23 1149   12/13/23 2200  vancomycin  (VANCOCIN ) IVPB 1000 mg/200 mL premix  Status:  Discontinued        1,000 mg 200 mL/hr over 60  Minutes Intravenous 2 times daily 12/13/23 1536 12/16/23 1453   12/13/23 1630  fluconazole  (DIFLUCAN ) IVPB 800 mg  Status:  Discontinued        800 mg 200 mL/hr over 120 Minutes Intravenous  Once 12/13/23 1536 12/13/23 1558   12/13/23 1630  ceFEPIme  (MAXIPIME ) 2 g in sodium chloride  0.9 % 100 mL IVPB  Status:  Discontinued        2 g 200 mL/hr over 30 Minutes Intravenous Every 8 hours 12/13/23 1536 12/16/23 1453   12/13/23 1630  vancomycin  (VANCOCIN ) IVPB 1000 mg/200 mL premix        1,000 mg 200 mL/hr over 60 Minutes Intravenous  Once 12/13/23 1536 12/13/23 1707   12/13/23 1630  fluconazole  (DIFLUCAN ) IVPB 400 mg        400 mg 100 mL/hr over 120 Minutes Intravenous Every 1 hr x 2 12/13/23 1558 12/13/23 1909   11/27/23 1200  erythromycin  500 mg in sodium chloride  0.9 % 100 mL IVPB        500 mg 100  mL/hr over 60 Minutes Intravenous Every 6 hours 11/27/23 0933 11/29/23 0723   11/24/23 1400  erythromycin  500 mg in sodium chloride  0.9 % 100 mL IVPB  Status:  Discontinued        500 mg 100 mL/hr over 60 Minutes Intravenous Every 8 hours 11/24/23 1027 11/27/23 0933   11/23/23 2000  erythromycin  250 mg in sodium chloride  0.9 % 100 mL IVPB  Status:  Discontinued        250 mg 100 mL/hr over 60 Minutes Intravenous Every 8 hours 11/23/23 1744 11/24/23 1027   11/23/23 1200  erythromycin  (E-MYCIN ) tablet 250 mg  Status:  Discontinued        250 mg Oral 3 times daily before meals 11/23/23 1004 11/23/23 1744   11/18/23 1200  erythromycin  (E-MYCIN ) tablet 250 mg  Status:  Discontinued        250 mg Oral 2 times daily with meals 11/18/23 0720 11/19/23 0734   11/13/23 1000  acyclovir  (ZOVIRAX ) tablet 400 mg  Status:  Discontinued        400 mg Oral 2 times daily 11/13/23 0503 11/19/23 0739        Assessment/Plan  s/p Procedure(s) with comments: INSERTION, GASTROSTOMY/JEJUNOSTOMY TUBE, PERCUTANEOUS (N/A) - PEG placement with Endo, Laparoscopic J-Tube   S/p diagnostic laparoscopy by Dr. [name] on 11/03/23  - Intra-op: Small bowel appeared intermittently dilated and injected consistent with inflammation, small amount of clear ascites, mesenteric edema and injection.  No obstruction present however he does have a very long, mobile small bowel mesentery concerning for intermittent volvulus  - Small bowel follow through on 8/22 with no passage of contrast from stomach to small bowel was noted within the first 3 hours of the exam despite positioning patient to encourage gastric emptying. Patient was returned to the floor and KUB images at 4 hrs, 8 hrs, and 24 hrs after contrast administration were done. Delay films with contrast in the rectum with gaseous distention of small bowel with gas-filled colon. Bx results reviewed.  - He has had trial of erythromycin  and reported as intolerant to Reglan  (hx of  Tardive Dyskinesia) - Small bowel endoscopy 8/28 by GI w/ small HH, mild inflammation characterized by erosions from NG tube trauma was found in the gastric body, no evidence of significant pathology in the entire examined duodenum and no evidence of significant pathology at 60 cm ( distal to the pylorus). Biopsy without clear etiology to explain  patients symptoms.  - CT 9/2 w/ Small-bowel obstruction with transition point in the mid abdomen. Large amount of stool in the rectum with rectal wall thickening and presacral edema. 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 - IR consulted 9/3 for possible venting G-tube placement and IR felt pt's anatomy is not favorable for percutaneous G tube placement - GI has patient on bowel regimen on TID miralax  with PRN lactulose  enema. He had Linzess  earlier in the admission. Had TD with trial of reglan . GI was looking into possible Motegrity; appreciate Dr. Rosalie stopping by yesterday and following as an outpatient for trial of pro-motility agents.  - s/p Dx laparoscopy, G tube and J tube placement 9/12 Dr. Stevie. J Tube feeds at goal. G tube remains to gravity. Overnight he developed vomiting, ongoing diarrhea, and increased abdominal pain. His WBC is 39, was repeated and is 46. Potassium 5.2. AKI w/ BUN 31/Cr 1.53. his abdominal exam shows an acute change with diffuse tenderness compared to focal tenderness around G/J tubes yesterday. DG abd reviewed by me - diffuse bowel dilation, I do not see pneumatosis on plain film, I cannot see the diaphragm to accurately assess for free air and there is a lot of bowel dilation so free cannot be excluded. Patients labs and exam are concerning for possible bowel ischemia. Tube feeds stopped, I have asked RN To place G tube to LIWS. I have ordered a STAT CT of the abdomen and pelvis with IV and NOT PO contrast. I think the benefit of IV contrast outweighs the risk in this instance due to concerns of intestinal ischemia (GFR  48). I have ordered a fluid bolus and ask [facility] to order maintenance fluids. I called CT and they can scan him now, this was communicated to the RN over the phone who says they will transport him downstairs immediately.     ADDENDUM 1120: I went to CT with the patient, who was still answering questions and following commands but was becoming more lethargic and tachypneic. Due to concerns of rapidly progressing septic shock I called critical care medicine who quickly accepted him into the ICU for possible need for intubation/critical care assistance. CT scan confirmed suspected bowel ischemia. This was reviewed by one of my attending surgeons who confirmed non-survivable bowel ischemia. Our surgical team, along with the CCM team, discussed this with the patients wife who opted to proceed with comfort measures, DNR/DNI.    FEN -IVF per primary, stop tube feeds, G tube to suction VTE - SCDs, therapeutic Lovenox  ID - Cefepime /Vanc/Fluconazole  per primary      LOS: 38 days   I reviewed nursing notes, hospitalist notes, last 24 h vitals and pain scores, last 48 h intake and output, last 24 h labs and trends, and last 24 h imaging results.  This care required high  level of medical decision making.   Almarie Pringle, PA-C [facility] Please see Amion for pager number during day hours 7:00am-4:30pm

## 2024-01-03 NOTE — Death Summary Note (Signed)
 Death Summary  David Mcgrath FMW:[id number] DOB: 10-24-1950 DOA: 2023-12-06  PCP: [name], DO  Admit date: 12-06-2023 Date of Death: 14-Jan-2024 Time of Death: 01-23-57 Notification: [name], DO notified of death of Jan 14, 2024   History of present illness:  David Mcgrath is a 73 y.o. male with a history of  David Mcgrath presented with complaint of  David Mcgrath did not improve after ***(very brief description of intervention) (type brief HPI or brief interim summary & hospital course depending on length of stay; Was pt made DNR/comfort care?)  Final Diagnoses:  1.   ***   The results of significant diagnostics from this hospitalization (including imaging, microbiology, ancillary and laboratory) are listed below for reference.    Significant Diagnostic Studies: CT ABDOMEN PELVIS W CONTRAST Addendum Date: January 14, 2024 ADDENDUM REPORT: 14-Jan-2024 12:27 ADDENDUM: Critical Value/emergent results were called by telephone at the time of interpretation on Jan 14, 2024 at 11:59 am to provider [facility] , who verbally acknowledged these results. Results also conveyed to critical care nurse Council, NP at 11:56 am Electronically Signed   By: Jackquline Boxer M.D.   On: 14-Jan-2024 12:27   Result Date: 01-14-2024 CLINICAL DATA:  Abdominal distension.  Postop ileus. EXAM: CT ABDOMEN AND PELVIS WITH CONTRAST TECHNIQUE: Multidetector CT imaging of the abdomen and pelvis was performed using the standard protocol following bolus administration of intravenous contrast. RADIATION DOSE REDUCTION: This exam was performed according to the departmental dose-optimization program which includes automated exposure control, adjustment of the mA and/or kV according to patient size and/or use of iterative reconstruction technique. CONTRAST:  OMNIPAQUE  IOHEXOL  300 MG/ML  SOLN COMPARISON:  CT 12/14/2023 FINDINGS: Lower chest: Bibasilar fine airspace disease.  Hepatobiliary: Extensive portal venous gas throughout the LEFT and RIGHT hepatic lobe. Gas within the main portal vein. Normal gallbladder Pancreas: Pancreas is normal. No ductal dilatation. No pancreatic inflammation. Spleen: Normal spleen Adrenals/urinary tract: Adrenal glands and kidneys are normal. The ureters and bladder normal. Stomach/Bowel: There is gas within the wall of the stomach. There is extensive pneumatosis intestinalis with gas within the wall of majority of the proximal small bowel. There is intraperitoneal free air additionally. There is a PEG tube within the gastric antrum. Percutaneous jejunostomy noted with tip in the lumen the proximal small bowel. The colon is relatively normal.  Colon is decompressed. Vascular/Lymphatic: Abdominal aorta is normal caliber with atherosclerotic calcification. There is no retroperitoneal or periportal lymphadenopathy. No pelvic lymphadenopathy. Reproductive: Prostate unremarkable Other: Small amount subcutaneous gas along the RIGHT ventral abdominal wall Musculoskeletal: No aggressive osseous lesion. IMPRESSION: 1. Extensive pneumatosis intestinalis and extensive portal venous gas. Findings are most consistent with small bowel ischemia. 2. Moderate volume intraperitoneal free air. 3. Bibasilar airspace disease suggests pulmonary edema. Electronically Signed: By: Jackquline Boxer M.D. On: January 14, 2024 11:51   DG Abd Portable 1V Result Date: 2024/01/14 CLINICAL DATA:  [id number] Vomiting [id number] EXAM: PORTABLE ABDOMEN - 1 VIEW COMPARISON:  December 14, 2022 FINDINGS: Diffuse gaseous distention of the small bowel measuring up to 6 cm.Moderate volume pneumoperitoneum. Percutaneous jejunostomy tube overlying the left abdomen. No acute fracture or destructive lesion. The lung bases are clear. IMPRESSION: 1. Moderate volume pneumoperitoneum, more than would be expected for this stage in the postoperative recovery related to the percutaneous jejunostomy placement.  Follow-up CT of the abdomen and pelvis with IV contrast is recommended. 2. Worsening gaseous distention of the small bowel throughout the abdomen. This can also be evaluated on the aforementioned  CT. Critical Value/emergent results were called by telephone at the time of interpretation on 01-11-2024 at 10:38 am to provider [facility], who verbally acknowledged these results. Electronically Signed   By: Rogelia Myers M.D.   On: January 11, 2024 10:40   CT ABDOMEN PELVIS W CONTRAST Result Date: 12/14/2023 CLINICAL DATA:  Abdominal pain, postop.  Prolonged postop ileus. EXAM: CT ABDOMEN AND PELVIS WITH CONTRAST TECHNIQUE: Multidetector CT imaging of the abdomen and pelvis was performed using the standard protocol following bolus administration of intravenous contrast. RADIATION DOSE REDUCTION: This exam was performed according to the departmental dose-optimization program which includes automated exposure control, adjustment of the mA and/or kV according to patient size and/or use of iterative reconstruction technique. CONTRAST:  OMNIPAQUE  IOHEXOL  300 MG/ML  SOLN COMPARISON:  12/05/2023 FINDINGS: Lower chest: Ground-glass opacities in both lower lobes are similar to prior study. No effusions. Coronary artery calcifications. Hepatobiliary: No focal hepatic abnormality. Gallbladder unremarkable. Pancreas: No focal abnormality or ductal dilatation. Spleen: No focal abnormality.  Normal size. Adrenals/Urinary Tract: No adrenal abnormality. No focal renal abnormality. No stones or hydronephrosis. Urinary bladder is unremarkable. Stomach/Bowel: NG tube in the stomach which is decompressed. There are several mildly dilated fluid-filled small bowel loops into the pelvis. Distal small bowel loops are decompressed. Large bowel is decompressed. A few of the right lower quadrant small bowel loops appear mildly thick walled. Vascular/Lymphatic: Aortoiliac atherosclerosis. No evidence of aneurysm or adenopathy.  Reproductive: No visible focal abnormality. Other: No free fluid or free air. Musculoskeletal: No acute bony abnormality. IMPRESSION: Mildly dilated fluid-filled small bowel loops into the pelvis. Several distal small bowel loops appear mildly thick walled. This could reflect infectious or inflammatory enteritis with small bowel ileus or some degree of resulting functional obstruction. Large bowel is decompressed. NG tube coils in the stomach. Continued ground-glass opacities in the lower lobes, similar to prior study. Aortic atherosclerosis, coronary artery disease. Electronically Signed   By: Franky Crease M.D.   On: 12/14/2023 17:40   DG Abd 1 View Result Date: 12/14/2023 EXAM: 1 VIEW XRAY OF THE ABDOMEN 12/14/2023 07:34:00 AM COMPARISON: 12/13/2023 CLINICAL HISTORY: 01250 Ileus (HCC) [id number]. F/u ileus FINDINGS: BOWEL: Persistent distended small-bowel loops. Within the left hemiabdomen there is a small bowel loop measuring up to 5.2 cm. SOFT TISSUES: No opaque urinary calculi. BONES: No acute osseous abnormality. IMPRESSION: 1. Persistent distended small-bowel loops, with a loop in the left hemiabdomen measuring up to 5.2 cm, consistent with ileus. Electronically signed by: Waddell Calk MD 12/14/2023 08:01 AM EDT RP Workstation: [id number]   DG Abd Portable 1V-Small Bowel Obstruction Protocol-initial, 8 hr delay Result Date: 12/13/2023 CLINICAL DATA:  8 hour delayed image. EXAM: PORTABLE ABDOMEN - 1 VIEW COMPARISON:  Radiograph dated 12/11/2023. FINDINGS: Enteric tube in similar position. Persistent diffuse dilatation of the small bowel measure up to 4.5 cm. Overall interval worsening of small bowel dilatation compared to prior radiograph. No definite oral contrast noted. Degenerative changes of the spine. No acute osseous pathology. IMPRESSION: Interval worsening of small bowel dilatation compared to prior radiograph. Electronically Signed   By: Vanetta Chou M.D.   On: 12/13/2023 18:08   DG CHEST  PORT 1 VIEW Result Date: 12/13/2023 CLINICAL DATA:  [id number] SOB (shortness of breath) [id number] EXAM: PORTABLE CHEST - 1 VIEW COMPARISON:  11/18/2023 FINDINGS: Esophagogastric tube is well positioned terminating in the stomach. Left PICC in place terminating in the high right atrium. Low lung volumes with bronchovascular crowding. Streaky left basilar atelectasis. No focal airspace  consolidation, pleural effusion, or pneumothorax. No cardiomegaly. Aortic atherosclerosis. IMPRESSION: 1. Low lung volumes with streaky left basilar atelectasis. 2. Well-positioned esophagogastric tube terminating in the stomach. Electronically Signed   By: Rogelia Myers M.D.   On: 12/13/2023 14:34   DG Abd 1 View Result Date: 12/11/2023 CLINICAL DATA:  Ileus. EXAM: ABDOMEN - 1 VIEW COMPARISON:  Radiographs 12/09/2023 and 10/15/2023.  CT 12/05/2023. FINDINGS: 1008 hours. Two supine views the abdomen and pelvis are submitted. Persistent central small bowel distension, slightly increased compared with the most recent prior study from 2 days ago. Some gas remains within the colon. No supine evidence of pneumoperitoneum. Enteric tube is unchanged, projecting over the proximal stomach. No suspicious abdominal calcifications or acute osseous findings. There are stable degenerative changes in the spine. IMPRESSION: Slightly increased small bowel distension compared with the most recent prior study from 2 days ago. Findings remain most consistent with an ileus. Low-grade partial small bowel obstruction considered less likely. Electronically Signed   By: Elsie Perone M.D.   On: 12/11/2023 14:01   DG Abd 1 View Result Date: 12/09/2023 CLINICAL DATA:  Abdominal distension, ileus EXAM: ABDOMEN - 1 VIEW COMPARISON:  12/09/2023 at 8:01 a.m. FINDINGS: Two supine frontal views of the abdomen and pelvis are obtained. There is nonspecific gaseous distension of the large and small bowel, with decreased caliber of the small bowel dilatation seen  previously. Findings may reflect resolving obstruction or ileus. No masses or abnormal calcifications. Enteric catheter tip projects over the gastric fundus. IMPRESSION: 1. Nonspecific gaseous distension of the large and small bowel, with decreased caliber since prior study, which may reflect resolving obstruction or ileus. Electronically Signed   By: Ozell Daring M.D.   On: 12/09/2023 18:21   DG Abd 1 View Result Date: 12/09/2023 CLINICAL DATA:  Ileus, diarrhea. EXAM: ABDOMEN - 1 VIEW COMPARISON:  December 07, 2003. FINDINGS: Mildly dilated small bowel loops are noted in left side of abdomen. Nasogastric tube tip is seen in proximal stomach. No colonic dilatation. IMPRESSION: Mildly dilated small bowel loops are noted in left side of abdomen concerning for ileus or possibly obstruction. Electronically Signed   By: Lynwood Landy Raddle M.D.   On: 12/09/2023 09:30   DG Abd 1 View Result Date: 12/07/2023 CLINICAL DATA:  Ileus EXAM: ABDOMEN - 1 VIEW COMPARISON:  Abdominal radiograph dated 12/06/2023 FINDINGS: Gastric/enteric tube tip projects over the stomach. Decreased size of gas-filled bowel loops throughout the abdomen. No free air or pneumatosis. No abnormal radio-opaque calculi or mass effect. No acute or substantial osseous abnormality. The sacrum and coccyx are partially obscured by overlying bowel contents. IMPRESSION: Decreased size of gas-filled bowel loops throughout the abdomen, likely improving ileus. Electronically Signed   By: Limin  Xu M.D.   On: 12/07/2023 15:29   DG Abd Portable 1V Result Date: 12/06/2023 CLINICAL DATA:  Ileus. EXAM: PORTABLE ABDOMEN - 1 VIEW COMPARISON:  12/05/2023. FINDINGS: Persistent gaseous distention of small bowel. There is some gas in the descending colon and rectum. Pattern is similar to yesterday's exam. Contrast is seen in the bladder. IMPRESSION: Persistent partial small bowel obstruction. Electronically Signed   By: Newell Eke M.D.   On: 12/06/2023 08:12   CT  ABDOMEN PELVIS W CONTRAST Result Date: 12/05/2023 CLINICAL DATA:  Bowel obstruction suspected. EXAM: CT ABDOMEN AND PELVIS WITH CONTRAST TECHNIQUE: Multidetector CT imaging of the abdomen and pelvis was performed using the standard protocol following bolus administration of intravenous contrast. RADIATION DOSE REDUCTION: This exam was performed  according to the departmental dose-optimization program which includes automated exposure control, adjustment of the mA and/or kV according to patient size and/or use of iterative reconstruction technique. CONTRAST:  OMNIPAQUE  IOHEXOL  300 MG/ML  SOLN COMPARISON:  CT abdomen and pelvis 11/16/2023. FINDINGS: Lower chest: There are patchy airspace opacities in the bilateral lower lobes, right greater this is similar to prior. Hepatobiliary: No focal liver abnormality is seen. No gallstones, gallbladder wall thickening, or biliary dilatation. Pancreas: Unremarkable. No pancreatic ductal dilatation or surrounding inflammatory changes. Spleen: Normal in size without focal abnormality. Adrenals/Urinary Tract: The bladder wall is trabeculated. The kidneys and adrenal glands are within normal limits. Stomach/Bowel: There are dilated small bowel loops in the right abdomen measuring up to 7 cm. Transition point is seen in the mid abdomen. Distal small bowel and colon are nondilated. Colonic diverticula are present. The colon is nondilated. There is a large amount of stool in the rectum with some presacral edema and rectal wall thickening. The appendix is not visualized. The stomach is distended despite nasogastric tube tip in the body of the stomach. Vascular/Lymphatic: Aortic atherosclerosis. No enlarged abdominal or pelvic lymph nodes. Reproductive: There are calcifications of the prostate gland. Other: There is no ascites or focal abdominal wall hernia. There is mild body wall edema. Musculoskeletal: Degenerative changes affect the spine and hips. IMPRESSION: 1. Small-bowel  obstruction with transition point in the mid abdomen. 2. Large amount of stool in the rectum with rectal wall thickening and presacral edema. 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 3. Colonic diverticulosis. 4. Trabeculated bladder wall. 5. Mild body wall edema. 6. Patchy airspace opacities in the bilateral lower lobes, right greater than left, similar to prior. 7. Aortic atherosclerosis. Aortic Atherosclerosis (ICD10-I70.0). Electronically Signed   By: Greig Pique M.D.   On: 12/05/2023 23:19   DG Abd Portable 1V Result Date: 12/05/2023 CLINICAL DATA:  Ileus. EXAM: PORTABLE ABDOMEN - 1 VIEW COMPARISON:  December 04, 2023. FINDINGS: Nasogastric tube tip is again noted in proximal stomach. Increased small bowel dilatation is seen centrally and right upper quadrant concerning for obstruction possibly ileus. No definite colonic dilatation is noted. IMPRESSION: Increased small bowel dilatation is noted concerning for obstruction or possibly ileus. Electronically Signed   By: Lynwood Landy Raddle M.D.   On: 12/05/2023 13:38   DG Abd Portable 1V Result Date: 12/04/2023 CLINICAL DATA:  [id number] Ileus (HCC) [id number] EXAM: PORTABLE ABDOMEN - 1 VIEW COMPARISON:  December 03, 2023 FINDINGS: Diffuse gaseous distension of loops of small and large bowel. Enteric tube tip and side port project over the stomach. Degree of dilation is similar compared to prior with the most prominent loops of bowel in the upper abdomen. Air is visualized in the rectum. IMPRESSION: Similar degree of gaseous distension of loops of small and large bowel suggestive of ileus. Electronically Signed   By: Corean Salter M.D.   On: 12/04/2023 10:39   DG Abd Portable 1V Result Date: 12/03/2023 EXAM: 1 VIEW XRAY OF THE ABDOMEN 12/03/2023 06:14:12 AM COMPARISON: Previous exam. CLINICAL HISTORY: 01250 Ileus (HCC) [id number]. ileus FINDINGS: BOWEL: Gastric Tube extends to the decompressed stomach. Several gas dilated mid abdominal small bowel loops. Colon  appears decompressed. Fecalith in the rectum. SOFT TISSUES: No opaque urinary calculi. BONES: Mild bilateral Hip DJD. No acute osseous abnormality. VASCULATURE: Femoral arterial calcifications. Aortic atherosclerosis (ICD10-I70.0). IMPRESSION: 1. Ileus with several gas dilated mid abdominal small bowel loops and decompressed colon. Electronically signed by: Katheleen Faes MD 12/03/2023 09:11 AM EDT RP Workstation:  [id number]   DG Abd 1 View Result Date: 12/03/2023 EXAM: 1 VIEW XRAY OF THE ABDOMEN 12/03/2023 09:02:00 AM COMPARISON: Earlier film of the same day. CLINICAL HISTORY: [id number] Encounter for nasogastric (NG) tube placement [id number]. Confirm NG tube placement. Encounter for nasogastric (NG) tube placement [id number]. Confirm NG tube placement. FINDINGS: BOWEL: A few gaseous distended small bowel loops in the upper abdomen. Lower abdomen excluded. SOFT TISSUES: No opaque urinary calculi. BONES: No acute osseous abnormality. LINES AND TUBES: Gastric tube  has been advanced further into the decompressed stomach. IMPRESSION: 1. NG tube tip projects over the stomach, confirming placement. 2. A few gaseous distended small bowel loops in the upper abdomen. Lower abdomen excluded. Electronically signed by: Katheleen Faes MD 12/03/2023 09:09 AM EDT RP Workstation: [id number]   DG Abd 1 View Result Date: 12/02/2023 EXAM: 1 VIEW XRAY OF THE ABDOMEN 12/02/2023 11:40:00 AM COMPARISON: 12/02/2023 CLINICAL HISTORY: Ileus (HCC) 01250. Confirm NGT placement FINDINGS: BOWEL: Multiple gas-filled dilated loops of small bowel, unchanged from previous exam. SOFT TISSUES: No opaque urinary calculi. Enteric tube in place with distal tip and side port terminating within the expected location of the stomach BONES: No acute osseous abnormality. IMPRESSION: 1. Multiple gas-filled dilated loops of small bowel, unchanged from previous exam, consistent with ileus. 2. Enteric tube in place with distal tip and side port terminating within the  expected location of the stomach. Electronically signed by: Waddell Calk MD 12/02/2023 11:46 AM EDT RP Workstation: [id number]   DG Abd Portable 1V Result Date: 12/02/2023 EXAM: 1 VIEW XRAY OF THE ABDOMEN 12/02/2023 08:28:00 AM COMPARISON: Previous exam. CLINICAL HISTORY: 01250 Ileus (HCC) 01250. ileus [id number] Ileus (HCC) [id number]. ileus FINDINGS: BOWEL: Multiple gas-dilated mid abdominal small bowel loops overall relatively stable in number of involved loops and degree of dilatation. The colon is decompressed with some oral contrast distally. SOFT TISSUES: No opaque urinary calculi. BONES: Bilateral hip DJD. No acute osseous abnormality. IMPRESSION: 1. Multiple gas-dilated mid abdominal small bowel loops, relatively stable in number and degree of dilatation, consistent with ileus. 2. Decompressed colon with some oral contrast distally. Electronically signed by: Dayne Hassell MD 12/02/2023 10:18 AM EDT RP Workstation: [id number]   DG Abd 1 View Result Date: 12/01/2023 CLINICAL DATA:  Diarrhea and ileus. EXAM: ABDOMEN - 1 VIEW COMPARISON:  Multiple recent studies with the most recent dated 11/27/2023 FINDINGS: Relatively stable dilated bowel, predominantly small bowel. The most dilated small bowel in the right abdomen shows slightly increased dilatation measuring up to 5.8 cm compared to 5.4 cm on the prior study. The colon appears relatively decompressed. Findings again are consistent with partial small bowel obstruction versus ileus. No signs of free air. IMPRESSION: Slight increase in dilatation of small bowel in the right abdomen. Findings again are consistent with partial small bowel obstruction versus ileus. Electronically Signed   By: Marcey Moan M.D.   On: 12/01/2023 10:43   VAS US  LOWER EXTREMITY VENOUS (DVT) Result Date: 11/27/2023  Lower Venous DVT Study Patient Name:  David Mcgrath  Date of Exam:   11/26/2023 Medical Rec #: [id number]                Accession #:    [id number] Date of  Birth: [birth year]                Patient Gender: M Patient Age:   66 years Exam Location:  [facility] Procedure:      VAS US  LOWER EXTREMITY VENOUS (DVT) Referring Phys: ALEJANDRO MARKER --------------------------------------------------------------------------------  Indications: Swelling, and Edema.  Limitations: Bandages and urinary catheter is attached to left thigh. Performing Technologist: Elmarie Lindau, RVT  Examination Guidelines: A complete evaluation includes B-mode imaging, spectral Doppler, color Doppler, and power Doppler as needed of all accessible portions of each vessel. Bilateral testing is considered an integral part of a complete examination. Limited examinations for reoccurring indications may be performed as noted. The reflux portion of the exam is performed with the patient in reverse Trendelenburg.  +---------+---------------+---------+-----------+----------+--------------+ RIGHT    CompressibilityPhasicitySpontaneityPropertiesThrombus Aging +---------+---------------+---------+-----------+----------+--------------+ CFV      Partial                                      Chronic        +---------+---------------+---------+-----------+----------+--------------+ SFJ      Full                                                        +---------+---------------+---------+-----------+----------+--------------+ FV Prox  Full                                                        +---------+---------------+---------+-----------+----------+--------------+ FV Mid   Full                                                        +---------+---------------+---------+-----------+----------+--------------+ FV DistalFull                                                        +---------+---------------+---------+-----------+----------+--------------+ PFV      Full                                                         +---------+---------------+---------+-----------+----------+--------------+ POP      Full                                                        +---------+---------------+---------+-----------+----------+--------------+ PTV      Full                                                        +---------+---------------+---------+-----------+----------+--------------+ PERO     Full                                                        +---------+---------------+---------+-----------+----------+--------------+   +---------+---------------+---------+-----------+----------+-----------------+  LEFT     CompressibilityPhasicitySpontaneityPropertiesThrombus Aging    +---------+---------------+---------+-----------+----------+-----------------+ CFV      Partial                                      Chronic           +---------+---------------+---------+-----------+----------+-----------------+ SFJ      Full                                                           +---------+---------------+---------+-----------+----------+-----------------+ FV Prox  Full                                                           +---------+---------------+---------+-----------+----------+-----------------+ FV Mid                                                not assessed      +---------+---------------+---------+-----------+----------+-----------------+ FV Distal                                             not assessed      +---------+---------------+---------+-----------+----------+-----------------+ PFV      Full                                                           +---------+---------------+---------+-----------+----------+-----------------+ POP      Full                                                           +---------+---------------+---------+-----------+----------+-----------------+ PTV      Full                                                            +---------+---------------+---------+-----------+----------+-----------------+ PERO     Full                                                           +---------+---------------+---------+-----------+----------+-----------------+ SSV      None  Age Indeterminate +---------+---------------+---------+-----------+----------+-----------------+     Summary: RIGHT: - 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  - No cystic structure found in the popliteal fossa.  LEFT: - Findings consistent with age indeterminate superficial vein thrombosis involving the left small sahenous vein.  - Findings consistent with chronic deep vein thrombosis involving the left common femoral vein.  - No cystic structure found in the popliteal fossa.  *See table(s) above for measurements and observations. Electronically signed by Fonda Rim on 11/27/2023 at 3:06:25 PM.    Final    DG Abd 1 View Result Date: 11/27/2023 CLINICAL DATA:  73 year old male with abdominal distension. Dilated small bowel on CT 11/16/2023. Right abdominal transition point suspected. EXAM: ABDOMEN - 1 VIEW COMPARISON:  Portable radiographs yesterday and earlier. FINDINGS: Portable AP supine view at 0452 hours. Enteric tube terminates in the left upper quadrant, satisfactory at the level of the stomach. Bowel gas pattern not significantly changed from yesterday and dilated gas containing small bowel loops in the right abdomen, right upper quadrant persist. Stable lung bases. There is large bowel gas present including in the pelvis, rectum. No dilated large bowel. Stable visualized osseous structures. IMPRESSION: 1. Stable enteric tube. 2. No improvement in bowel gas pattern: dilated gas containing small bowel loops in the right abdomen. Nondilated gas containing large bowel including in the rectum. Constellation favors a persistent partial small bowel  obstruction. Electronically Signed   By: VEAR Hurst M.D.   On: 11/27/2023 08:48   DG Abd 1 View Result Date: 11/26/2023 CLINICAL DATA:  Abdominal distension with ileus. EXAM: ABDOMEN - 1 VIEW COMPARISON:  11/25/2023 FINDINGS: Diffuse gaseous distension of bowel in the abdomen and pelvis is similar to prior. There is some contrast material visible in the left colon as before. NG tube tip is in the stomach. IMPRESSION: Diffuse gaseous distension of bowel in the abdomen and pelvis, similar to prior. Electronically Signed   By: Camellia Candle M.D.   On: 11/26/2023 09:16   DG Abd 1 View Result Date: 11/25/2023 CLINICAL DATA:  Intestinal pseudo-obstruction. Small-bowel follow-through protocol. 24 hour delay. EXAM: ABDOMEN - 1 VIEW COMPARISON:  11/24/2023. FINDINGS: Enteric tube with tip and side port in the gastric body. Minimal bowel contrast material is again noted along the left abdomen. The previously noted contrast material in the rectum is not included within the field of view of this exam. Persistent gaseous dilation of small bowel in the central and right abdomen and gas-filled left colon. IMPRESSION: Persistent gaseous dilation of small bowel in the central and right abdomen and gas-filled left colon, suggestive of ileus. Minimal bowel contrast material again seen along the left abdomen, although the previously noted contrast material in the rectum is not included within the field of view of this exam. Electronically Signed   By: Harrietta Sherry M.D.   On: 11/25/2023 10:50   DG Abd 1 View Result Date: 11/24/2023 CLINICAL DATA:  Intestinal pseudo-obstruction. Small-bowel follow-through protocol. 8 hour KUB. EXAM: ABDOMEN - 1 VIEW COMPARISON:  11/24/2023 FINDINGS: An enteric tube is present with tip projecting over the left upper quadrant consistent with location in the body of the stomach. Minimal bowel contrast material is demonstrated but there appears to be some contrast in the rectum which would suggest  no evidence of complete obstruction. There are dilated gas distended small bowel loops with gas-filled colon. Changes likely to represent adynamic ileus. Degenerative changes in the spine and hips. IMPRESSION: 1. Minimal bowel  contrast material is demonstrated. There appears to be contrast material in the rectum suggesting no evidence of complete obstruction. 2. Gaseous distention of small bowel with gas-filled colon most likely ileus. Electronically Signed   By: Elsie Gravely M.D.   On: 11/24/2023 17:41   DG UGI W SMALL BOWEL Result Date: 11/24/2023 CLINICAL DATA:  Inpatient with NGT with concern for small bowel/GI tract dysmotility. EXAM: SMALL BOWEL FOLLOW-THROUGH SERIES FLUOROSCOPY: Radiation Exposure Index (as provided by the fluoroscopic device): 9.2 mGy Kerma TECHNIQUE: Diluted water -soluble contrast was introduced via the nasogastric tube. Subsequently, serial images were obtained. No passage of contrast from stomach to small bowel was noted within the first 3 hours of the exam despite positioning patient to encourage gastric emptying. Given that this patient's care has required placement in ICU, decision was made to return patient to the floor and continue exam with KUB images at 4 hrs, 8 hrs, and 24 hrs after contrast administration. This exam was performed by [place], NP, and was supervised and interpreted by Dr. Landy. COMPARISON:  11/23/23 KUB FINDINGS: Continued dilated bowel loops.  Delayed gastric emptying noted. IMPRESSION: Contrast present in stomach with delayed gastric emptying. Electronically Signed   By: Lynwood Landy Raddle M.D.   On: 11/24/2023 16:13   DG Abd 1 View Result Date: 11/24/2023 CLINICAL DATA:  Gastroparesis. EXAM: DG ABDOMEN 1V COMPARISON:  Same day. FINDINGS: Dilated small bowel loops are noted most prominently in the right upper quadrant concerning for ileus or partial small bowel obstruction. Nasogastric tube tip is seen in proximal stomach. No colonic dilatation  is noted. There does appear to be contrast in nondilated bowel loops in the pelvis, although it is hard to determine if this is large or small bowel. IMPRESSION: Dilated small bowel loops are noted most prominently in the right upper quadrant concerning for ileus or partial small bowel obstruction. Contrast is noted in nondilated bowel loops in the pelvis, although it is hard to determine if this is large or small bowel. Electronically Signed   By: Lynwood Landy Raddle M.D.   On: 11/24/2023 14:40   DG [place] MATSU Tube Plc W/Fl W/Rad Result Date: 11/24/2023 INDICATION: History of gastroparesis, delayed gastric motility. Patient removed previous NG tube last night. Consulted for NG tube re-placement. EXAM: Nasogastric tube placement MEDICATIONS: Viscous lidocaine  ANESTHESIA/SEDATION: None CONTRAST:  None FLUOROSCOPY: Radiation Exposure Index (as provided by the fluoroscopic device): 6.50 mGy Kerma COMPLICATIONS: None immediate. PROCEDURE: Patient was placed supine. Viscous lidocaine  and anesthetic spray was applied to the left naris. Waited approximately 10 minutes. Viscous lidocaine  was applied to the tip of the NG tube. The NG tube was guided into the left naris with no resistance and visualized under intermittent fluoroscopy down the esophagus into the stomach. FINDINGS: NG tube visualized passing into the stomach under fluoroscopy. Patient tolerated procedure well with no complications. IMPRESSION: Successful nasogastric tube placement in the stomach under fluoroscopy guidance. Performed by: Wyatt Pommier PA-C Electronically Signed   By: Norleen DELENA Kil M.D.   On: 11/24/2023 10:19   DG Abd 1 View Result Date: 11/24/2023 CLINICAL DATA:  Short of breath EXAM: ABDOMEN - 1 VIEW COMPARISON:  11/23/2023 FINDINGS: NG tube in stomach. Side port below the GE junction. Dilated loops of small bowel not changed from 1 day prior. IMPRESSION: 1. NG tube in stomach. 2. No change in bowel gas pattern. Electronically Signed   By: Jackquline Boxer M.D.   On: 11/24/2023 09:18   DG Fluoro Rm 1-60 Min -  No Report Result Date: 11/23/2023 Fluoroscopy was utilized by the requesting physician.  No radiographic interpretation.   DG Fluoro Rm 1-60 Min - No Report Result Date: 11/23/2023 Fluoroscopy was utilized by the requesting physician.  No radiographic interpretation.   DG Abd 1 View Result Date: 11/23/2023 CLINICAL DATA:  Abdominal distension EXAM: ABDOMEN - 1 VIEW COMPARISON:  CT abdomen 11/16/2023 FINDINGS: Similar pattern of gas within the stomach, small bowel and right colon. Pattern is most consistent with either ileus or pseudo-obstruction. True obstruction is doubtful, particularly given the prior CT. IMPRESSION: Similar pattern of gas within the stomach, small bowel and right colon. Pattern is most consistent with either ileus or pseudo-obstruction. Electronically Signed   By: Oneil Officer M.D.   On: 11/23/2023 08:41    Microbiology: Recent Results (from the past 240 hours)  Culture, blood (Routine X 2) w Reflex to ID Panel     Status: Abnormal   Collection Time: 12/13/23  3:33 PM   Specimen: BLOOD RIGHT ARM  Result Value Ref Range Status   Specimen Description   Final    BLOOD RIGHT ARM Performed at [facility], [address] [place], [place] [address]    Special Requests   Final    BOTTLES DRAWN AEROBIC AND ANAEROBIC Blood Culture adequate volume Performed at [facility], [address], [place], [place] [address]    Culture  Setup Time   Final    GRAM POSITIVE COCCI IN CLUSTERS AEROBIC BOTTLE ONLY CRITICAL RESULT CALLED TO, READ BACK BY AND VERIFIED WITH: PHARMD MICHELLE L [id number] FCP    Culture (A)  Final    MICROCOCCUS LUTEUS/LYLAE Standardized susceptibility testing for this organism is not available. Performed at [facility], [address], [place], [place] [address]    Report Status 12/17/2023 FINAL  Final  Blood Culture ID Panel (Reflexed)     Status: None    Collection Time: 12/13/23  3:33 PM  Result Value Ref Range Status   Enterococcus faecalis NOT DETECTED NOT DETECTED Final   Enterococcus Faecium NOT DETECTED NOT DETECTED Final   Listeria monocytogenes NOT DETECTED NOT DETECTED Final   Staphylococcus species NOT DETECTED NOT DETECTED Final   Staphylococcus aureus (BCID) NOT DETECTED NOT DETECTED Final   Staphylococcus epidermidis NOT DETECTED NOT DETECTED Final   Staphylococcus lugdunensis NOT DETECTED NOT DETECTED Final   Streptococcus species NOT DETECTED NOT DETECTED Final   Streptococcus agalactiae NOT DETECTED NOT DETECTED Final   Streptococcus pneumoniae NOT DETECTED NOT DETECTED Final   Streptococcus pyogenes NOT DETECTED NOT DETECTED Final   A.calcoaceticus-baumannii NOT DETECTED NOT DETECTED Final   Bacteroides fragilis NOT DETECTED NOT DETECTED Final   Enterobacterales NOT DETECTED NOT DETECTED Final   Enterobacter cloacae complex NOT DETECTED NOT DETECTED Final   Escherichia coli NOT DETECTED NOT DETECTED Final   Klebsiella aerogenes NOT DETECTED NOT DETECTED Final   Klebsiella oxytoca NOT DETECTED NOT DETECTED Final   Klebsiella pneumoniae NOT DETECTED NOT DETECTED Final   Proteus species NOT DETECTED NOT DETECTED Final   Salmonella species NOT DETECTED NOT DETECTED Final   Serratia marcescens NOT DETECTED NOT DETECTED Final   Haemophilus influenzae NOT DETECTED NOT DETECTED Final   Neisseria meningitidis NOT DETECTED NOT DETECTED Final   Pseudomonas aeruginosa NOT DETECTED NOT DETECTED Final   Stenotrophomonas maltophilia NOT DETECTED NOT DETECTED Final   Candida albicans NOT DETECTED NOT DETECTED Final   Candida auris NOT DETECTED NOT DETECTED Final   Candida  glabrata NOT DETECTED NOT DETECTED Final   Candida krusei NOT DETECTED NOT DETECTED Final   Candida parapsilosis NOT DETECTED NOT DETECTED Final   Candida tropicalis NOT DETECTED NOT DETECTED Final   Cryptococcus neoformans/gattii NOT DETECTED NOT DETECTED Final     Comment: Performed at [facility], [address], [place], [place] [address]  Culture, blood (Routine X 2) w Reflex to ID Panel     Status: None   Collection Time: 12/13/23  3:35 PM   Specimen: BLOOD RIGHT HAND  Result Value Ref Range Status   Specimen Description   Final    BLOOD RIGHT HAND Performed at [facility], [address], [place], [place] [address]    Special Requests   Final    BOTTLES DRAWN AEROBIC AND ANAEROBIC Blood Culture adequate volume Performed at [facility], [address], [place], [place] [address]    Culture   Final    NO GROWTH 5 DAYS Performed at [facility], [address], [place], [place] [address]    Report Status 12/18/2023 FINAL  Final  Urine Culture (for pregnant, neutropenic or urologic patients or patients with an indwelling urinary catheter)     Status: Abnormal   Collection Time: 12/13/23  5:30 PM   Specimen: Urine, Clean Catch  Result Value Ref Range Status   Specimen Description   Final    URINE, CLEAN CATCH Performed at [facility], [address] [place], [place] [address]    Special Requests   Final    NONE Performed at [facility], [address] [place], [place] [address]    Culture >=100,000 COLONIES/mL ENTEROCOCCUS FAECALIS (A)  Final   Report Status 12/15/2023 FINAL  Final   Organism ID, Bacteria ENTEROCOCCUS FAECALIS (A)  Final      Susceptibility   Enterococcus faecalis - MIC*    AMPICILLIN  <=2 SENSITIVE Sensitive     NITROFURANTOIN <=16 SENSITIVE Sensitive     VANCOMYCIN  1 SENSITIVE Sensitive     * >=100,000 COLONIES/mL ENTEROCOCCUS FAECALIS  MRSA Next Gen by PCR, Nasal     Status: None   Collection Time: 12/19/23  4:48 AM   Specimen: Nasal Mucosa; Nasal Swab  Result Value Ref Range Status   MRSA by PCR Next Gen NOT DETECTED NOT DETECTED Final    Comment: (NOTE) The GeneXpert MRSA Assay (FDA approved for NASAL specimens only), is one  component of a comprehensive MRSA colonization surveillance program. It is not intended to diagnose MRSA infection nor to guide or monitor treatment for MRSA infections. Test performance is not FDA approved in patients less than 27 years old. Performed at [facility], [address] [place], [place] [address]      Labs: Basic Metabolic Panel: Recent Labs  Lab 12/16/23 0535 12/17/23 0530 12/18/23 0520 12/20/23 0500 2024-01-05 0823  NA 136 136 136 141 137  K 4.8 4.0 3.8 4.2 5.2*  CL 107 103 102 104 98  CO2 18* 23 26 26  18*  GLUCOSE 137* 103* 113* 109* 136*  BUN 31* 24* 25* 23 31*  CREATININE 0.53* 0.42* 0.42* 0.47* 1.53*  CALCIUM  8.9 8.8* 8.4* 8.7* 9.4  MG  --   --  1.7 1.9  --   PHOS  --   --  2.4* 3.9  --    Liver Function Tests: Recent Labs  Lab 12/15/23 0500 12/16/23 0535 12/17/23 0530 12/18/23 0520 12/20/23 0500  AST 41  31 37 67* 47*  ALT 43 38 39 94* 79*  ALKPHOS 140* 120 127* 149* 166*  BILITOT 0.4 0.3 0.3 0.3 0.3  PROT 6.5 6.5 6.4* 5.7* 5.8*  ALBUMIN  2.8* 2.9* 2.9* 2.6* 2.8*   No results for input(s): LIPASE, AMYLASE in the last 168 hours. No results for input(s): AMMONIA in the last 168 hours. CBC: Recent Labs  Lab 12/17/23 0530 12/18/23 0520 12/20/23 0500 2023-12-22 0650 2023/12/22 0823  WBC 8.1 7.2 6.8 39.1* 46.6*  HGB 9.1* 8.9* 9.0* 11.8* 12.0*  HCT 29.5* 28.0* 28.9* 37.7* 37.9*  MCV 98.3 97.9 97.3 97.9 98.4  PLT 322 319 379 645* 659*   Cardiac Enzymes: No results for input(s): CKTOTAL, CKMB, CKMBINDEX, TROPONINI in the last 168 hours. D-Dimer No results for input(s): DDIMER in the last 72 hours. BNP: Invalid input(s): POCBNP CBG: Recent Labs  Lab 12/20/23 1638 12/20/23 1948 12-22-23 0023 12/22/2023 0354 12/22/23 0749  GLUCAP 121* 99 104* 98 126*   Anemia work up No results for input(s): VITAMINB12, FOLATE, FERRITIN, TIBC, IRON, RETICCTPCT in the last 72 hours. Urinalysis    Component  Value Date/Time   COLORURINE YELLOW 12/13/2023 1730   APPEARANCEUR HAZY (A) 12/13/2023 1730   LABSPEC 1.023 12/13/2023 1730   PHURINE 5.0 12/13/2023 1730   GLUCOSEU NEGATIVE 12/13/2023 1730   HGBUR NEGATIVE 12/13/2023 1730   BILIRUBINUR NEGATIVE 12/13/2023 1730   KETONESUR NEGATIVE 12/13/2023 1730   PROTEINUR NEGATIVE 12/13/2023 1730   NITRITE NEGATIVE 12/13/2023 1730   LEUKOCYTESUR LARGE (A) 12/13/2023 1730   Sepsis Labs Recent Labs  Lab 12/18/23 0520 12/20/23 0500 2023-12-22 0650 12-22-2023 0823  WBC 7.2 6.8 39.1* 46.6*       SIGNED:  Ivonne Mustache, MD  [facility] 12-22-2023, 2:13 PM Pager [phone]  If 7PM-7AM, please contact night-coverage [email or web address] Password [email or web address]

## 2024-01-03 NOTE — Consult Note (Signed)
 73 yo complex and prolonged hospital stay r/t delayed gastric emptying who acutely decompensated Jan 07, 2024.  We were called by CCS as pt was going to CT a/p with c/f mesenteric ischemia as well as free air, with plan to transfer to ICU after CT and to consult.   Unfortunately his CT is devastating and reveals a non-survivable process. CCS met with pts wife, PCCM joined. A decision was reached to transition to comfort care.  We will place comfort med orders, change code status to DNR and dc labs, other meds as discussed w pt wife.   I will relay this to primary team, who can remain primary given  plan of care.     PCCM will sign off.    Ronnald Gave MSN, AGACNP-BC [facility] Pulmonary/Critical Care Medicine 2024-01-07, 11:24 AM

## 2024-01-03 NOTE — TOC Progression Note (Signed)
 Transition of Care [facility]) - Progression Note    Patient Details  Name: David Mcgrath MRN: [id number] Date of Birth: 26-Feb-1951  Transition of Care [facility]) CM/SW Contact  Laquana Villari, Glenys DASEN, RN Phone Number:  Clinical Narrative:    CM spoke with patient and wife in the room. Discussed with them that CIR is still considering for admission and follow progression. Patient did have a set back G-J tube feeding.  Expected Discharge Plan: Skilled Nursing Facility Barriers to Discharge: Continued Medical Work up               Expected Discharge Plan and Services In-house Referral: NA Discharge Planning Services: CM Consult Post Acute Care Choice: Skilled Nursing Facility Living arrangements for the past 2 months: Single Family Home                 DME Arranged: N/A DME Agency: NA       HH Arranged: NA HH Agency: NA         Social Drivers of Health (SDOH) Interventions SDOH Screenings   Food Insecurity: No Food Insecurity (11/13/2023)  Housing: Low Risk  (11/13/2023)  Transportation Needs: No Transportation Needs (11/13/2023)  Utilities: Not At Risk (11/13/2023)  Depression (PHQ2-9): Low Risk  (11/09/2023)  Social Connections: Socially Integrated (11/13/2023)  Tobacco Use: Low Risk  (12/15/2023)    Readmission Risk Interventions    11/05/2023   10:31 AM 10/15/2023   11:45 AM  Readmission Risk Prevention Plan  Transportation Screening Complete Complete  PCP or Specialist Appt within 3-5 Days Complete Complete  HRI or Home Care Consult Complete Complete  Social Work Consult for Recovery Care Planning/Counseling Complete Complete  Palliative Care Screening Not Applicable Not Applicable  Medication Review Oceanographer) Complete Complete

## 2024-01-03 NOTE — Progress Notes (Signed)
 Physical Medicine & Rehabilitation Consult Service  Pt discussed with rehab admissions coordinator. Chart has been reviewed. This is a 73 yo male with MM, peripheral neuropathy,  HTN, PAF/eliquis , who presented on 8/10 with abdominal pain and distention. He was found to have an ileus/SBO and has had a prolonged hospital course which included a diagnostic laparoscopy/G-J-tube placement on 9/12. G-tube is draining to gravity and pt is receiving feeds thru J-tube. Course also complicated by an enterococcus UTI.    Home: Home Living Family/patient expects to be discharged to:: Private residence Living Arrangements: Spouse/significant other Available Help at Discharge: Family, Friend(s), Network engineer (friends and neighbor PRN) Type of Home: House Home Access: Ramped entrance Secretary/administrator of Steps: 2 Entrance Stairs-Rails: Left Home Layout: One level Bathroom Shower/Tub: Health visitor: Standard Home Equipment: Information systems manager, Agricultural consultant (2 wheels), [employer] - single point, Wheelchair - manual, Grab bars - toilet, Grab bars - tub/shower, Hand held shower head, Scientist, physiological Equipment: Reacher Additional Comments: Been using RW since recent d/c. Was getting HHPT/OT.  Functional History: Prior Function Prior Level of Function : Independent/Modified Independent Mobility Comments: uses RW d/t neuropathy progression ADLs Comments: Mod I with ADLs, uses shower chair. Been doing sponge baths recently. Has assist with shower transfers for safety. Functional Status:  Mobility: Bed Mobility Overal bed mobility: Needs Assistance Bed Mobility: Supine to Sit, Sit to Supine Rolling: Mod assist, Used rails Sidelying to sit: Mod assist, HOB elevated, Used rails Supine to sit: Mod assist, HOB elevated, Used rails Sit to supine: Mod assist, Max assist, +2 for physical assistance, +2 for safety/equipment, HOB elevated, Used rails General bed mobility comments: cues, assist  for multiple lines and tubes Transfers Overall transfer level: Needs assistance Equipment used: Rolling walker (2 wheels) Transfers: Sit to/from Stand Sit to Stand: Mod assist, +2 physical assistance Bed to/from chair/wheelchair/BSC transfer type:: Step pivot Stand pivot transfers: Max assist, +2 physical assistance, +2 safety/equipment, From elevated surface Step pivot transfers: Mod assist, +2 physical assistance, +2 safety/equipment, From elevated surface Transfer via Lift Equipment: Stedy General transfer comment: attempt for commode then recliner transfers with patient eager and loose stool occurance with need for hygiene Ambulation/Gait Ambulation/Gait assistance: Min assist, Mod assist, +2 physical assistance, +2 safety/equipment Gait Distance (Feet): 22 Feet (20 feet + 2 feet) Assistive device: Elyn Finder Gait Pattern/deviations: Trunk flexed, Step-through pattern, Decreased stride length, Decreased dorsiflexion - right, Decreased dorsiflexion - left, Shuffle, Wide base of support General Gait Details: used B platform EVA walker this session to advance gait.  Pt required + 2 side by side assist as he was VERY NERVOUS and Spouse was used to follow with the recliner.  Pt required MAX ENCOURAGEMENT to amb.  INCREASED ANXIETY.  Pt was able to amb 20 feet into the hallway with short, shuffled steps and reassurance that the chair is right behind you.  Required a 5 min seated rest break before attempting another walker but only went to 2 feet.  Tha'ts enough, stated Pt.  All vitals looked good.  HR avg 124 and RA 98%.  RR did increase to high 30's and Pt did present with 2/4 dyspnea.  Pt grossly deconditioned from his extended hospital stay. Gait velocity: decreased    ADL: ADL Overall ADL's : Needs assistance/impaired Eating/Feeding: Set up, Sitting (ice chips this session with no issues spoon to mouth) Grooming: Contact guard assist, Sitting, Wash/dry hands, Wash/dry face, Oral care,  Applying deodorant, Brushing hair Grooming Details (indicate cue type and reason): shavign  routine as well seated EOB for up to 10 minutes prior to recliner transfer STS Upper Body Bathing: Contact guard assist, Sitting Lower Body Bathing: Maximal assistance Lower Body Bathing Details (indicate cue type and reason): unable to reach to buttocks or lower legs Upper Body Dressing : Moderate assistance Lower Body Dressing: Total assistance, Sit to/from stand, Maximal assistance Lower Body Dressing Details (indicate cue type and reason): Foley and rectal collection tube Toilet Transfer:  (STS with loose stool occurence) Toilet Transfer Details (indicate cue type and reason): fatigues so Network engineer and Hygiene: Total assistance Toileting - Clothing Manipulation Details (indicate cue type and reason): Foley and loose stools Functional mobility during ADLs: Moderate assistance, +2 for physical assistance, +2 for safety/equipment, Rolling walker (2 wheels) General ADL Comments: improved seated level unsupported EOB full grooming tasks tolerance  Cognition: Cognition Orientation Level: Oriented X4 Cognition Arousal: Alert Behavior During Therapy: Flat affect   Assessment: 73 yo male with severe debility after prolonged hospital stay d/t SBO and multiple associated medical issues   Plan:  This patient would benefit from acute inpatient rehab to address functional mobility and self-care, care giver education. Additionally, the patient requires daily MD oversight of the active medical issues noted above. Projected goals would be supervision to min assist depending upon the activity with an ELOS of 12-16 days.  Dispo and social supports are appropriate.    Rehab Admissions Coordinator to follow up.    Arthea IVAR Gunther, MD, [facility] Physical Medicine & Rehabilitation Medical Director Rehabilitation Services 24-Dec-2023

## 2024-01-03 NOTE — Progress Notes (Signed)
 Inpatient Rehab Admissions Coordinator:   Events noted.  Will continue to follow.   Reche Lowers, PT, DPT Admissions Coordinator [phone] 01-08-2024  11:16 AM

## 2024-01-03 NOTE — Plan of Care (Signed)
  Problem: Education: Goal: Knowledge of General Education information will improve Description: Including pain rating scale, medication(s)/side effects and non-pharmacologic comfort measures Outcome: Progressing   Problem: Health Behavior/Discharge Planning: Goal: Ability to manage health-related needs will improve Outcome: Progressing   Problem: Clinical Measurements: Goal: Ability to maintain clinical measurements within normal limits will improve Outcome: Progressing Goal: Will remain free from infection Outcome: Progressing Goal: Diagnostic test results will improve Outcome: Progressing Goal: Respiratory complications will improve Outcome: Progressing Goal: Cardiovascular complication will be avoided Outcome: Progressing   Problem: Activity: Goal: Risk for activity intolerance will decrease Outcome: Progressing   Problem: Nutrition: Goal: Adequate nutrition will be maintained Outcome: Progressing   Problem: Coping: Goal: Level of anxiety will decrease Outcome: Progressing   Problem: Elimination: Goal: Will not experience complications related to bowel motility Outcome: Progressing Goal: Will not experience complications related to urinary retention Outcome: Progressing   Problem: Pain Managment: Goal: General experience of comfort will improve and/or be controlled Outcome: Progressing   Problem: Safety: Goal: Ability to remain free from injury will improve Outcome: Progressing   Problem: Skin Integrity: Goal: Risk for impaired skin integrity will decrease Outcome: Progressing   Problem: Nutrition Goal: Patient maintains adequate hydration Outcome: Progressing Goal: Patient maintains weight Outcome: Progressing   Problem: Education: Goal: Individualized Educational Video(s) Outcome: Progressing   Problem: Coping: Goal: Ability to adjust to condition or change in health will improve Outcome: Progressing   Problem: Fluid Volume: Goal: Ability to  maintain a balanced intake and output will improve Outcome: Progressing   Problem: Health Behavior/Discharge Planning: Goal: Ability to identify and utilize available resources and services will improve Outcome: Progressing Goal: Ability to manage health-related needs will improve Outcome: Progressing   Problem: Nutritional: Goal: Maintenance of adequate nutrition will improve Outcome: Progressing Goal: Progress toward achieving an optimal weight will improve Outcome: Progressing   Problem: Skin Integrity: Goal: Risk for impaired skin integrity will decrease Outcome: Progressing   Problem: Tissue Perfusion: Goal: Adequacy of tissue perfusion will improve Outcome: Progressing

## 2024-01-03 NOTE — Progress Notes (Signed)
 Nutrition Brief Note  Chart reviewed. Pt now transitioning to comfort care.  No further nutrition interventions planned at this time.    Morna Lee, MS, RD, LDN Inpatient Clinical Dietitian Contact via Secure chat

## 2024-01-03 NOTE — Progress Notes (Addendum)
 Unit received call from radiology to relay results of STAT CT a/p to a provider. I discussed results with radiologist and acknowledged results.   Ronnald Gave MSN, AGACNP-BC [facility] Pulmonary/Critical Care Medicine 01-02-2024, 11:58 AM

## 2024-01-03 DEATH — deceased

## 2024-01-04 ENCOUNTER — Other Ambulatory Visit

## 2024-01-04 ENCOUNTER — Ambulatory Visit

## 2024-01-11 ENCOUNTER — Ambulatory Visit

## 2024-01-11 ENCOUNTER — Ambulatory Visit: Admitting: Hematology and Oncology

## 2024-01-11 ENCOUNTER — Other Ambulatory Visit

## 2024-01-18 ENCOUNTER — Other Ambulatory Visit

## 2024-01-18 ENCOUNTER — Ambulatory Visit

## 2024-01-18 ENCOUNTER — Ambulatory Visit: Admitting: Hematology and Oncology

## 2024-02-21 ENCOUNTER — Encounter: Payer: Self-pay | Admitting: Oncology
